# Patient Record
Sex: Female | Born: 1940
Health system: Southern US, Community
[De-identification: ages and names within clinical notes are randomized; demographics above are authoritative.]

## PROBLEM LIST (undated history)

## (undated) DIAGNOSIS — K219 Gastro-esophageal reflux disease without esophagitis: Secondary | ICD-10-CM

## (undated) DIAGNOSIS — I10 Essential (primary) hypertension: Secondary | ICD-10-CM

## (undated) DIAGNOSIS — Z923 Personal history of irradiation: Secondary | ICD-10-CM

## (undated) DIAGNOSIS — I517 Cardiomegaly: Secondary | ICD-10-CM

## (undated) DIAGNOSIS — E785 Hyperlipidemia, unspecified: Secondary | ICD-10-CM

## (undated) DIAGNOSIS — Z9889 Other specified postprocedural states: Secondary | ICD-10-CM

## (undated) DIAGNOSIS — K8020 Calculus of gallbladder without cholecystitis without obstruction: Secondary | ICD-10-CM

## (undated) DIAGNOSIS — R0600 Dyspnea, unspecified: Secondary | ICD-10-CM

## (undated) DIAGNOSIS — R112 Nausea with vomiting, unspecified: Secondary | ICD-10-CM

## (undated) DIAGNOSIS — R609 Edema, unspecified: Secondary | ICD-10-CM

## (undated) DIAGNOSIS — Z7189 Other specified counseling: Secondary | ICD-10-CM

## (undated) DIAGNOSIS — E119 Type 2 diabetes mellitus without complications: Secondary | ICD-10-CM

## (undated) DIAGNOSIS — I639 Cerebral infarction, unspecified: Secondary | ICD-10-CM

## (undated) DIAGNOSIS — J449 Chronic obstructive pulmonary disease, unspecified: Secondary | ICD-10-CM

## (undated) HISTORY — DX: Calculus of gallbladder without cholecystitis without obstruction: K80.20

## (undated) HISTORY — DX: Cardiomegaly: I51.7

## (undated) HISTORY — DX: Hyperlipidemia, unspecified: E78.5

## (undated) HISTORY — PX: CHOLECYSTECTOMY: SHX55

## (undated) HISTORY — DX: Type 2 diabetes mellitus without complications: E11.9

## (undated) HISTORY — DX: Essential (primary) hypertension: I10

## (undated) HISTORY — DX: Gastro-esophageal reflux disease without esophagitis: K21.9

## (undated) HISTORY — DX: Chronic obstructive pulmonary disease, unspecified: J44.9

## (undated) HISTORY — DX: Personal history of irradiation: Z92.3

## (undated) HISTORY — PX: ABDOMINAL HYSTERECTOMY: SHX81

## (undated) HISTORY — DX: Edema, unspecified: R60.9

## (undated) HISTORY — DX: Cerebral infarction, unspecified: I63.9

## (undated) NOTE — *Deleted (*Deleted)
GI Location of Tumor / Histology: ***  Tonya [name] presented *** months ago with symptoms of: ***  Biopsies of *** (if applicable) revealed: {:18581}  Past/Anticipated interventions by surgeon, if any:   Past/Anticipated interventions by medical oncology, if any: FOLFOX  Weight changes, if any: {:18581}  Bowel/Bladder complaints, if any: {:18581}  Nausea / Vomiting, if any: {:18581}  Pain issues, if any:  {:18581}  Any blood per rectum:   {:18581}  SAFETY ISSUES:  Prior radiation? {:18581}  Pacemaker/ICD? {:18581}  Possible current pregnancy? {:18581}  Is the patient on methotrexate? {:18581}  Current Complaints/Details:Stage IV adenocarcinoma of the ascending colon -- NRAS+, BRAF-, HER2-, MSI/MMR -,  Iron deficiency anemia DVT in LEFT leg-- Peroneal and popliteal vein

---
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
## 1999-04-25 DIAGNOSIS — I639 Cerebral infarction, unspecified: Secondary | ICD-10-CM

## 1999-04-25 HISTORY — DX: Cerebral infarction, unspecified: I63.9

## 2000-02-13 ENCOUNTER — Inpatient Hospital Stay (HOSPITAL_COMMUNITY): Admission: EM | Admit: 2000-02-13 | Discharge: 2000-02-16 | Payer: Self-pay | Admitting: Emergency Medicine

## 2000-02-14 ENCOUNTER — Encounter: Payer: Self-pay | Admitting: Neurology

## 2000-02-16 ENCOUNTER — Inpatient Hospital Stay (HOSPITAL_COMMUNITY)
Admission: RE | Admit: 2000-02-16 | Discharge: 2000-02-28 | Payer: Self-pay | Admitting: Physical Medicine & Rehabilitation

## 2000-07-30 ENCOUNTER — Encounter (HOSPITAL_COMMUNITY): Admission: RE | Admit: 2000-07-30 | Discharge: 2000-08-29 | Payer: Self-pay | Admitting: *Deleted

## 2000-08-31 ENCOUNTER — Encounter (HOSPITAL_COMMUNITY): Admission: RE | Admit: 2000-08-31 | Discharge: 2000-09-30 | Payer: Self-pay | Admitting: *Deleted

## 2000-09-20 ENCOUNTER — Other Ambulatory Visit: Admission: RE | Admit: 2000-09-20 | Discharge: 2000-09-20 | Payer: Self-pay | Admitting: Internal Medicine

## 2000-10-03 ENCOUNTER — Encounter (HOSPITAL_COMMUNITY)
Admission: RE | Admit: 2000-10-03 | Discharge: 2000-11-02 | Payer: Self-pay | Admitting: Physical Medicine & Rehabilitation

## 2001-08-28 ENCOUNTER — Encounter: Payer: Self-pay | Admitting: Internal Medicine

## 2001-08-28 ENCOUNTER — Ambulatory Visit (HOSPITAL_COMMUNITY): Admission: RE | Admit: 2001-08-28 | Discharge: 2001-08-28 | Payer: Self-pay | Admitting: Internal Medicine

## 2002-11-24 ENCOUNTER — Ambulatory Visit (HOSPITAL_COMMUNITY): Admission: RE | Admit: 2002-11-24 | Discharge: 2002-11-24 | Payer: Self-pay | Admitting: General Surgery

## 2002-11-24 ENCOUNTER — Encounter: Payer: Self-pay | Admitting: General Surgery

## 2003-12-31 ENCOUNTER — Ambulatory Visit (HOSPITAL_COMMUNITY): Admission: RE | Admit: 2003-12-31 | Discharge: 2003-12-31 | Payer: Self-pay | Admitting: Family Medicine

## 2004-07-20 ENCOUNTER — Ambulatory Visit (HOSPITAL_COMMUNITY): Admission: RE | Admit: 2004-07-20 | Discharge: 2004-07-20 | Payer: Self-pay | Admitting: Family Medicine

## 2004-10-20 ENCOUNTER — Ambulatory Visit: Payer: Self-pay | Admitting: Cardiovascular Disease

## 2004-10-27 ENCOUNTER — Ambulatory Visit: Payer: Self-pay

## 2005-02-20 ENCOUNTER — Ambulatory Visit (HOSPITAL_COMMUNITY): Admission: RE | Admit: 2005-02-20 | Discharge: 2005-02-20 | Payer: Self-pay | Admitting: Family Medicine

## 2005-11-03 ENCOUNTER — Ambulatory Visit: Payer: Self-pay

## 2006-01-16 ENCOUNTER — Ambulatory Visit: Payer: Self-pay | Admitting: Gastroenterology

## 2006-02-02 ENCOUNTER — Ambulatory Visit: Payer: Self-pay | Admitting: Gastroenterology

## 2006-02-22 ENCOUNTER — Ambulatory Visit (HOSPITAL_COMMUNITY): Admission: RE | Admit: 2006-02-22 | Discharge: 2006-02-22 | Payer: Self-pay | Admitting: Family Medicine

## 2006-03-06 ENCOUNTER — Ambulatory Visit: Payer: Self-pay | Admitting: Gastroenterology

## 2006-11-14 ENCOUNTER — Ambulatory Visit: Payer: Self-pay | Admitting: Gastroenterology

## 2006-11-15 ENCOUNTER — Ambulatory Visit: Payer: Self-pay | Admitting: Cardiology

## 2006-11-15 LAB — CONVERTED CEMR LAB
ALT: 10 units/L (ref 0–35)
AST: 20 units/L (ref 0–37)
Basophils Relative: 0 % (ref 0.0–1.0)
CO2: 28 meq/L (ref 19–32)
Creatinine, Ser: 0.7 mg/dL (ref 0.4–1.2)
Eosinophils Absolute: 0.1 10*3/uL (ref 0.0–0.6)
Eosinophils Relative: 1.3 % (ref 0.0–5.0)
GFR calc Af Amer: 107 mL/min
GFR calc non Af Amer: 88 mL/min
HCT: 37.7 % (ref 36.0–46.0)
Lymphocytes Relative: 37.9 % (ref 12.0–46.0)
MCHC: 33.5 g/dL (ref 30.0–36.0)
Neutro Abs: 2.4 10*3/uL (ref 1.4–7.7)
Neutrophils Relative %: 55.5 % (ref 43.0–77.0)
Potassium: 4.2 meq/L (ref 3.5–5.1)
Sodium: 140 meq/L (ref 135–145)
TSH: 1.25 microintl units/mL (ref 0.35–5.50)
WBC: 4.4 10*3/uL — ABNORMAL LOW (ref 4.5–10.5)

## 2006-11-27 ENCOUNTER — Encounter: Payer: Self-pay | Admitting: Gastroenterology

## 2006-11-27 ENCOUNTER — Ambulatory Visit (HOSPITAL_COMMUNITY): Admission: RE | Admit: 2006-11-27 | Discharge: 2006-11-27 | Payer: Self-pay | Admitting: Gastroenterology

## 2006-12-03 ENCOUNTER — Ambulatory Visit: Payer: Self-pay | Admitting: Gastroenterology

## 2006-12-18 ENCOUNTER — Ambulatory Visit: Payer: Self-pay | Admitting: Gastroenterology

## 2006-12-21 ENCOUNTER — Ambulatory Visit (HOSPITAL_COMMUNITY): Admission: RE | Admit: 2006-12-21 | Discharge: 2006-12-21 | Payer: Self-pay | Admitting: Gastroenterology

## 2007-02-27 ENCOUNTER — Ambulatory Visit (HOSPITAL_COMMUNITY): Admission: RE | Admit: 2007-02-27 | Discharge: 2007-02-27 | Payer: Self-pay | Admitting: Family Medicine

## 2007-07-26 DIAGNOSIS — J4489 Other specified chronic obstructive pulmonary disease: Secondary | ICD-10-CM | POA: Insufficient documentation

## 2007-07-26 DIAGNOSIS — I69359 Hemiplegia and hemiparesis following cerebral infarction affecting unspecified side: Secondary | ICD-10-CM | POA: Insufficient documentation

## 2007-07-26 DIAGNOSIS — J449 Chronic obstructive pulmonary disease, unspecified: Secondary | ICD-10-CM | POA: Insufficient documentation

## 2007-07-26 DIAGNOSIS — I10 Essential (primary) hypertension: Secondary | ICD-10-CM | POA: Insufficient documentation

## 2008-03-03 ENCOUNTER — Ambulatory Visit (HOSPITAL_COMMUNITY): Admission: RE | Admit: 2008-03-03 | Discharge: 2008-03-03 | Payer: Self-pay | Admitting: Family Medicine

## 2008-04-07 ENCOUNTER — Encounter: Admission: RE | Admit: 2008-04-07 | Discharge: 2008-04-07 | Payer: Self-pay | Admitting: Family Medicine

## 2008-04-15 ENCOUNTER — Telehealth: Payer: Self-pay | Admitting: Gastroenterology

## 2008-04-30 ENCOUNTER — Ambulatory Visit: Payer: Self-pay | Admitting: Nurse Practitioner

## 2008-05-08 ENCOUNTER — Ambulatory Visit: Payer: Self-pay | Admitting: Internal Medicine

## 2008-05-08 ENCOUNTER — Telehealth: Payer: Self-pay | Admitting: Internal Medicine

## 2008-05-11 ENCOUNTER — Ambulatory Visit (HOSPITAL_COMMUNITY): Admission: RE | Admit: 2008-05-11 | Discharge: 2008-05-11 | Payer: Self-pay | Admitting: Neurology

## 2008-05-15 ENCOUNTER — Telehealth: Payer: Self-pay | Admitting: Internal Medicine

## 2008-05-18 ENCOUNTER — Ambulatory Visit: Payer: Self-pay | Admitting: Internal Medicine

## 2008-05-18 DIAGNOSIS — M5416 Radiculopathy, lumbar region: Secondary | ICD-10-CM | POA: Insufficient documentation

## 2008-05-19 ENCOUNTER — Telehealth: Payer: Self-pay | Admitting: Internal Medicine

## 2008-06-02 ENCOUNTER — Telehealth: Payer: Self-pay | Admitting: Internal Medicine

## 2008-06-09 ENCOUNTER — Encounter
Admission: RE | Admit: 2008-06-09 | Discharge: 2008-09-07 | Payer: Self-pay | Admitting: Physical Medicine & Rehabilitation

## 2008-06-09 ENCOUNTER — Encounter: Admission: RE | Admit: 2008-06-09 | Discharge: 2008-07-08 | Payer: Self-pay | Admitting: Internal Medicine

## 2008-06-11 ENCOUNTER — Ambulatory Visit: Payer: Self-pay | Admitting: Physical Medicine & Rehabilitation

## 2008-06-18 ENCOUNTER — Ambulatory Visit: Payer: Self-pay | Admitting: Physical Medicine & Rehabilitation

## 2008-07-20 ENCOUNTER — Telehealth: Payer: Self-pay | Admitting: Internal Medicine

## 2008-07-23 ENCOUNTER — Ambulatory Visit: Payer: Self-pay | Admitting: Physical Medicine & Rehabilitation

## 2008-07-30 ENCOUNTER — Ambulatory Visit: Payer: Self-pay | Admitting: Internal Medicine

## 2008-08-04 ENCOUNTER — Ambulatory Visit: Payer: Self-pay | Admitting: Physical Medicine & Rehabilitation

## 2008-08-06 ENCOUNTER — Encounter: Payer: Self-pay | Admitting: Internal Medicine

## 2008-08-06 ENCOUNTER — Encounter: Admission: RE | Admit: 2008-08-06 | Discharge: 2008-08-06 | Payer: Self-pay | Admitting: Internal Medicine

## 2008-08-12 ENCOUNTER — Encounter (INDEPENDENT_AMBULATORY_CARE_PROVIDER_SITE_OTHER): Payer: Self-pay | Admitting: *Deleted

## 2008-09-08 ENCOUNTER — Telehealth: Payer: Self-pay | Admitting: Internal Medicine

## 2008-09-08 ENCOUNTER — Ambulatory Visit: Payer: Self-pay | Admitting: Gastroenterology

## 2008-09-10 ENCOUNTER — Telehealth (INDEPENDENT_AMBULATORY_CARE_PROVIDER_SITE_OTHER): Payer: Self-pay | Admitting: *Deleted

## 2008-09-10 LAB — CONVERTED CEMR LAB
ALT: 16 units/L (ref 0–35)
Albumin: 4.2 g/dL (ref 3.5–5.2)
BUN: 7 mg/dL (ref 6–23)
Basophils Absolute: 0 10*3/uL (ref 0.0–0.1)
CO2: 32 meq/L (ref 19–32)
Calcium: 9.6 mg/dL (ref 8.4–10.5)
Eosinophils Absolute: 0.1 10*3/uL (ref 0.0–0.7)
Eosinophils Relative: 1.4 % (ref 0.0–5.0)
GFR calc non Af Amer: 106.29 mL/min (ref >60)
HCT: 37.4 % (ref 36.0–46.0)
Hemoglobin: 12.9 g/dL (ref 12.0–15.0)
Lymphocytes Relative: 49.6 % — ABNORMAL HIGH (ref 12.0–46.0)
Lymphs Abs: 2.1 10*3/uL (ref 0.7–4.0)
MCV: 85.6 fL (ref 78.0–100.0)
Neutro Abs: 1.8 10*3/uL (ref 1.4–7.7)
Platelets: 191 10*3/uL (ref 150.0–400.0)
Potassium: 4.1 meq/L (ref 3.5–5.1)
Sodium: 144 meq/L (ref 135–145)
Total Protein: 7.1 g/dL (ref 6.0–8.3)
WBC: 4.3 10*3/uL — ABNORMAL LOW (ref 4.5–10.5)

## 2008-09-16 ENCOUNTER — Telehealth: Payer: Self-pay | Admitting: Gastroenterology

## 2008-09-28 ENCOUNTER — Ambulatory Visit: Payer: Self-pay | Admitting: Internal Medicine

## 2008-09-28 ENCOUNTER — Encounter: Payer: Self-pay | Admitting: Internal Medicine

## 2008-09-28 DIAGNOSIS — R011 Cardiac murmur, unspecified: Secondary | ICD-10-CM | POA: Insufficient documentation

## 2008-09-28 DIAGNOSIS — R609 Edema, unspecified: Secondary | ICD-10-CM | POA: Insufficient documentation

## 2008-09-30 LAB — CONVERTED CEMR LAB
Cholesterol: 184 mg/dL (ref 0–200)
LDL Cholesterol: 112 mg/dL — ABNORMAL HIGH (ref 0–99)
Triglycerides: 92 mg/dL (ref 0.0–149.0)

## 2008-10-12 ENCOUNTER — Encounter
Admission: RE | Admit: 2008-10-12 | Discharge: 2009-01-10 | Payer: Self-pay | Admitting: Physical Medicine & Rehabilitation

## 2008-10-12 ENCOUNTER — Ambulatory Visit: Payer: Self-pay | Admitting: Gastroenterology

## 2008-10-13 ENCOUNTER — Ambulatory Visit: Payer: Self-pay

## 2008-10-13 ENCOUNTER — Encounter: Payer: Self-pay | Admitting: Internal Medicine

## 2008-10-15 ENCOUNTER — Ambulatory Visit: Payer: Self-pay | Admitting: Physical Medicine & Rehabilitation

## 2008-10-21 ENCOUNTER — Telehealth (INDEPENDENT_AMBULATORY_CARE_PROVIDER_SITE_OTHER): Payer: Self-pay | Admitting: *Deleted

## 2008-11-03 ENCOUNTER — Ambulatory Visit: Payer: Self-pay | Admitting: Internal Medicine

## 2008-11-03 DIAGNOSIS — K219 Gastro-esophageal reflux disease without esophagitis: Secondary | ICD-10-CM | POA: Insufficient documentation

## 2008-11-06 ENCOUNTER — Encounter: Payer: Self-pay | Admitting: Gastroenterology

## 2008-11-17 ENCOUNTER — Telehealth: Payer: Self-pay | Admitting: Internal Medicine

## 2008-11-17 ENCOUNTER — Encounter: Payer: Self-pay | Admitting: Internal Medicine

## 2008-11-18 ENCOUNTER — Telehealth: Payer: Self-pay | Admitting: Internal Medicine

## 2008-11-19 ENCOUNTER — Ambulatory Visit: Payer: Self-pay | Admitting: Internal Medicine

## 2008-12-14 ENCOUNTER — Ambulatory Visit: Payer: Self-pay | Admitting: Internal Medicine

## 2008-12-14 ENCOUNTER — Ambulatory Visit: Payer: Self-pay | Admitting: Physical Medicine & Rehabilitation

## 2008-12-14 DIAGNOSIS — E785 Hyperlipidemia, unspecified: Secondary | ICD-10-CM | POA: Insufficient documentation

## 2008-12-23 ENCOUNTER — Telehealth: Payer: Self-pay | Admitting: Internal Medicine

## 2008-12-30 ENCOUNTER — Encounter (INDEPENDENT_AMBULATORY_CARE_PROVIDER_SITE_OTHER): Payer: Self-pay | Admitting: General Surgery

## 2008-12-30 ENCOUNTER — Ambulatory Visit (HOSPITAL_COMMUNITY): Admission: RE | Admit: 2008-12-30 | Discharge: 2008-12-31 | Payer: Self-pay | Admitting: General Surgery

## 2009-01-07 ENCOUNTER — Encounter
Admission: RE | Admit: 2009-01-07 | Discharge: 2009-04-07 | Payer: Self-pay | Admitting: Physical Medicine & Rehabilitation

## 2009-01-11 ENCOUNTER — Ambulatory Visit: Payer: Self-pay | Admitting: Physical Medicine & Rehabilitation

## 2009-01-12 ENCOUNTER — Encounter: Payer: Self-pay | Admitting: Gastroenterology

## 2009-01-22 ENCOUNTER — Telehealth: Payer: Self-pay | Admitting: Internal Medicine

## 2009-02-11 ENCOUNTER — Ambulatory Visit: Payer: Self-pay | Admitting: Internal Medicine

## 2009-02-11 LAB — CONVERTED CEMR LAB
Cholesterol, target level: 200 mg/dL
HDL goal, serum: 40 mg/dL

## 2009-03-11 ENCOUNTER — Ambulatory Visit: Payer: Self-pay | Admitting: Physical Medicine & Rehabilitation

## 2009-03-16 ENCOUNTER — Ambulatory Visit (HOSPITAL_COMMUNITY): Admission: RE | Admit: 2009-03-16 | Discharge: 2009-03-16 | Payer: Self-pay | Admitting: Internal Medicine

## 2009-03-22 ENCOUNTER — Telehealth: Payer: Self-pay | Admitting: Internal Medicine

## 2009-03-23 ENCOUNTER — Telehealth: Payer: Self-pay | Admitting: Internal Medicine

## 2009-03-24 ENCOUNTER — Encounter: Payer: Self-pay | Admitting: Internal Medicine

## 2009-03-25 ENCOUNTER — Ambulatory Visit (HOSPITAL_COMMUNITY): Admission: RE | Admit: 2009-03-25 | Discharge: 2009-03-25 | Payer: Self-pay | Admitting: Internal Medicine

## 2009-04-26 ENCOUNTER — Telehealth: Payer: Self-pay | Admitting: Internal Medicine

## 2009-05-11 ENCOUNTER — Telehealth: Payer: Self-pay | Admitting: Internal Medicine

## 2009-05-19 ENCOUNTER — Encounter
Admission: RE | Admit: 2009-05-19 | Discharge: 2009-05-21 | Payer: Self-pay | Admitting: Physical Medicine & Rehabilitation

## 2009-05-21 ENCOUNTER — Ambulatory Visit: Payer: Self-pay | Admitting: Physical Medicine & Rehabilitation

## 2009-05-25 ENCOUNTER — Telehealth: Payer: Self-pay | Admitting: Internal Medicine

## 2009-06-08 ENCOUNTER — Ambulatory Visit: Payer: Self-pay | Admitting: Internal Medicine

## 2009-06-08 LAB — CONVERTED CEMR LAB
Albumin: 4 g/dL (ref 3.5–5.2)
Alkaline Phosphatase: 58 units/L (ref 39–117)
BUN: 14 mg/dL (ref 6–23)
Bilirubin, Direct: 0.1 mg/dL (ref 0.0–0.3)
CO2: 33 meq/L — ABNORMAL HIGH (ref 19–32)
Chloride: 105 meq/L (ref 96–112)
Creatinine, Ser: 0.7 mg/dL (ref 0.4–1.2)
Glucose, Bld: 113 mg/dL — ABNORMAL HIGH (ref 70–99)
Potassium: 4.2 meq/L (ref 3.5–5.1)
Total Bilirubin: 0.8 mg/dL (ref 0.3–1.2)

## 2009-07-26 ENCOUNTER — Telehealth: Payer: Self-pay | Admitting: Internal Medicine

## 2009-08-30 ENCOUNTER — Encounter
Admission: RE | Admit: 2009-08-30 | Discharge: 2009-10-04 | Payer: Self-pay | Admitting: Physical Medicine & Rehabilitation

## 2009-09-02 ENCOUNTER — Ambulatory Visit: Payer: Self-pay | Admitting: Physical Medicine & Rehabilitation

## 2009-09-08 ENCOUNTER — Telehealth: Payer: Self-pay | Admitting: Internal Medicine

## 2009-09-14 ENCOUNTER — Ambulatory Visit: Payer: Self-pay | Admitting: Internal Medicine

## 2009-09-27 ENCOUNTER — Telehealth: Payer: Self-pay | Admitting: Internal Medicine

## 2009-09-27 ENCOUNTER — Ambulatory Visit: Payer: Self-pay | Admitting: Internal Medicine

## 2009-10-04 ENCOUNTER — Ambulatory Visit: Payer: Self-pay | Admitting: Physical Medicine & Rehabilitation

## 2009-12-20 ENCOUNTER — Ambulatory Visit: Payer: Self-pay | Admitting: Internal Medicine

## 2009-12-20 DIAGNOSIS — N318 Other neuromuscular dysfunction of bladder: Secondary | ICD-10-CM | POA: Insufficient documentation

## 2009-12-29 ENCOUNTER — Telehealth: Payer: Self-pay | Admitting: Internal Medicine

## 2010-01-20 ENCOUNTER — Telehealth: Payer: Self-pay | Admitting: Internal Medicine

## 2010-03-23 ENCOUNTER — Encounter
Admission: RE | Admit: 2010-03-23 | Discharge: 2010-05-24 | Payer: Self-pay | Source: Home / Self Care | Attending: Physical Medicine & Rehabilitation | Admitting: Physical Medicine & Rehabilitation

## 2010-03-25 ENCOUNTER — Ambulatory Visit (HOSPITAL_COMMUNITY)
Admission: RE | Admit: 2010-03-25 | Discharge: 2010-03-25 | Payer: Self-pay | Source: Home / Self Care | Admitting: Internal Medicine

## 2010-03-31 ENCOUNTER — Ambulatory Visit: Payer: Self-pay | Admitting: Physical Medicine & Rehabilitation

## 2010-04-26 ENCOUNTER — Ambulatory Visit
Admission: RE | Admit: 2010-04-26 | Discharge: 2010-04-26 | Payer: Self-pay | Source: Home / Self Care | Attending: Internal Medicine | Admitting: Internal Medicine

## 2010-04-26 LAB — CONVERTED CEMR LAB
ALT: 16 units/L (ref 0–35)
Albumin: 3.9 g/dL (ref 3.5–5.2)
Basophils Relative: 0.6 % (ref 0.0–3.0)
CO2: 31 meq/L (ref 19–32)
Calcium: 9.6 mg/dL (ref 8.4–10.5)
Cholesterol: 158 mg/dL (ref 0–200)
Creatinine, Ser: 0.5 mg/dL (ref 0.4–1.2)
Eosinophils Absolute: 0.1 10*3/uL (ref 0.0–0.7)
GFR calc non Af Amer: 145.85 mL/min (ref >60.00)
HCT: 39.2 % (ref 36.0–46.0)
HDL: 48.5 mg/dL (ref >39.00)
LDL Cholesterol: 96 mg/dL (ref 0–99)
Lymphocytes Relative: 50.2 % — ABNORMAL HIGH (ref 12.0–46.0)
Lymphs Abs: 2.2 10*3/uL (ref 0.7–4.0)
MCHC: 33.2 g/dL (ref 30.0–36.0)
MCV: 87.5 fL (ref 78.0–100.0)
Monocytes Relative: 7.1 % (ref 3.0–12.0)
Neutro Abs: 1.8 10*3/uL (ref 1.4–7.7)
Neutrophils Relative %: 39.8 % — ABNORMAL LOW (ref 43.0–77.0)
Total Bilirubin: 0.7 mg/dL (ref 0.3–1.2)
Total CHOL/HDL Ratio: 3
Total Protein: 6.8 g/dL (ref 6.0–8.3)
VLDL: 13.2 mg/dL (ref 0.0–40.0)

## 2010-04-27 ENCOUNTER — Encounter: Payer: Self-pay | Admitting: Internal Medicine

## 2010-04-29 ENCOUNTER — Telehealth: Payer: Self-pay | Admitting: Internal Medicine

## 2010-05-15 ENCOUNTER — Encounter: Payer: Self-pay | Admitting: Neurology

## 2010-05-24 NOTE — Progress Notes (Signed)
  Phone Note Refill Request   Refills Requested: Medication #1:  VALTURNA 150-160 MG TABS once daily for high blood pressure Initial call taken by: Ami Bullins CMA,  May 11, 2009 3:30 PM    Prescriptions: VALTURNA 150-160 MG TABS (ALISKIREN-VALSARTAN) once daily for high blood pressure  #56 x 2   Entered by:   Ami Bullins CMA   Authorized by:   Etta Grandchild MD   Signed by:   Bill Salinas CMA on 05/11/2009   Method used:   Electronically to        [facility] (retail)       [address]       [place], [place]  [address]       Ph: [phone]       Fax: [phone]   RxID:   [id number]

## 2010-05-24 NOTE — Progress Notes (Signed)
Summary: Samples  Phone Note Call from Patient   Caller: Daughter - Eunice Blase [phone] Summary of Call: Patient is requesting samples of valturna.  Initial call taken by: Lamar Sprinkles, CMA,  July 26, 2009 3:05 PM  Follow-up for Phone Call        Samples given to daughter Follow-up by: Lamar Sprinkles, CMA,  July 26, 2009 5:00 PM

## 2010-05-24 NOTE — Progress Notes (Signed)
Summary: ELEVATED BP  Phone Note Call from Patient   Summary of Call: Pt concerned about elevated bp. She checks bp three times a day. Bp has been running upper 140's over 80's.  Initial call taken by: Lamar Sprinkles, CMA,  Sep 08, 2009 11:03 AM  Follow-up for Phone Call        she should schedule a f/up soon Follow-up by: Etta Grandchild MD,  Sep 08, 2009 2:20 PM  Additional Follow-up for Phone Call Additional follow up Details #1::        Pt informed, transferred to scheduler Additional Follow-up by: Lamar Sprinkles, CMA,  Sep 08, 2009 2:54 PM

## 2010-05-24 NOTE — Progress Notes (Signed)
Summary: Samples  Phone Note Call from Patient   Summary of Call: Samples given to patient Initial call taken by: Lamar Sprinkles, CMA,  January 20, 2010 11:43 AM    Prescriptions: TOVIAZ 4 MG XR24H-TAB (FESOTERODINE FUMARATE) One by mouth once daily for overactive bladder  #7 x 0   Entered by:   Lamar Sprinkles, CMA   Authorized by:   Tresa Garter MD   Signed by:   Lamar Sprinkles, CMA on 01/20/2010   Method used:   Samples Given   RxID:   [id number] VALTURNA 300-320 MG TABS (ALISKIREN-VALSARTAN) One by mouth once daily for high blood pressure  #30 x 0   Entered by:   Lamar Sprinkles, CMA   Authorized by:   Tresa Garter MD   Signed by:   Lamar Sprinkles, CMA on 01/20/2010   Method used:   Samples Given   RxID:   [id number] BYSTOLIC 10 MG TABS (NEBIVOLOL HCL) once daily  #30 x 0   Entered by:   Lamar Sprinkles, CMA   Authorized by:   Tresa Garter MD   Signed by:   Lamar Sprinkles, CMA on 01/20/2010   Method used:   Samples Given   RxID:   [id number] SIMCOR 500-20 MG XR24H-TAB (NIACIN-SIMVASTATIN) One by mouth once daily  #14 x 0   Entered by:   Lamar Sprinkles, CMA   Authorized by:   Tresa Garter MD   Signed by:   Lamar Sprinkles, CMA on 01/20/2010   Method used:   Samples Given   RxID:   [id number] ASPIRIN 81 MG  TABS (ASPIRIN) 1 by mouth once daily  #30 x 0   Entered by:   Lamar Sprinkles, CMA   Authorized by:   Tresa Garter MD   Signed by:   Lamar Sprinkles, CMA on 01/20/2010   Method used:   Samples Given   RxID:   [id number]

## 2010-05-24 NOTE — Progress Notes (Signed)
Summary: refill--hctz  Phone Note Refill Request Message from:  Fax from [facility] on December 29, 2009 4:26 PM  Refills Requested: Medication #1:  HYDROCHLOROTHIAZIDE 50 MG TABS once daily for high blood pressure   Dosage confirmed as above?Dosage Confirmed   Supply Requested: 1 month   Last Refilled: 11/26/2009 Initial call taken by: Mervin Kung CMA (AAMA),  December 29, 2009 4:26 PM    Prescriptions: HYDROCHLOROTHIAZIDE 50 MG TABS (HYDROCHLOROTHIAZIDE) once daily for high blood pressure  #30 x 3   Entered by:   Mervin Kung CMA (AAMA)   Authorized by:   Etta Grandchild MD   Signed by:   Mervin Kung CMA (AAMA) on 12/29/2009   Method used:   Electronically to        [facility] (retail)       [address]       [place], [place]  [address]       Ph: [phone]       Fax: [phone]   RxID:   [id number]

## 2010-05-24 NOTE — Progress Notes (Signed)
Summary: Samples  Phone Note Call from Patient Call back at Home Phone [phone]   Caller: Daughter Call For: Etta Grandchild MD Summary of Call: Patient's daughter came into the office requesting samples of Valturna 150 mg for her mother. Initial call taken by: Irma Newness,  April 26, 2009 12:49 PM  Follow-up for Phone Call        samples given to daughter Follow-up by: Rock Nephew CMA,  April 26, 2009 1:10 PM

## 2010-05-24 NOTE — Assessment & Plan Note (Signed)
Summary: follow up-lb   Vital Signs:  Patient profile:   70 year old female Height:      71.5 inches Weight:      183.75 pounds BMI:     25.36 O2 Sat:      98 % on Room air Temp:     98.2 degrees F oral Pulse rate:   53 / minute Pulse rhythm:   regular Resp:     16 per minute BP sitting:   128 / 84  (right arm) Cuff size:   large  Vitals Entered By: Rock Nephew CMA (Sep 14, 2009 11:19 AM)  Nutrition Counseling: Patient's BMI is greater than 25 and therefore counseled on weight management options.  O2 Flow:  Room air  Primary Care Provider:  Etta Grandchild MD   History of Present Illness:  Hypertension Follow-Up      This is a 70 year old woman who presents for Hypertension follow-up.  The patient denies lightheadedness, urinary frequency, headaches, edema, impotence, rash, and fatigue.  The patient denies the following associated symptoms: chest pain, chest pressure, exercise intolerance, dyspnea, palpitations, syncope, leg edema, and pedal edema.  Compliance with medications (by patient report) has been near 100%.  The patient reports that dietary compliance has been poor.  The patient reports exercising occasionally.    Lipid Management History:      Positive NCEP/ATP III risk factors include female age 83 years old or older, hypertension, and prior stroke (or TIA).  Negative NCEP/ATP III risk factors include non-diabetic, no family history for ischemic heart disease, non-tobacco-user status, no ASHD (atherosclerotic heart disease), no peripheral vascular disease, and no history of aortic aneurysm.        The patient states that she knows about the "Therapeutic Lifestyle Change" diet.  Her compliance with the TLC diet is fair.  The patient expresses understanding of adjunctive measures for cholesterol lowering.  Adjunctive measures started by the patient include fiber, ASA, limit alcohol consumpton, and weight reduction.  She expresses no side effects from her lipid-lowering  medication.  The patient denies any symptoms to suggest myopathy or liver disease.     Preventive Screening-Counseling & Management  Alcohol-Tobacco     Alcohol drinks/day: 0     Smoking Status: never  Hep-HIV-STD-Contraception     Hepatitis Risk: no risk noted     HIV Risk: no risk noted     STD Risk: no risk noted      Sexual History:  not active.        Drug Use:  no.        Blood Transfusions:  no.    Medications Prior to Update: 1)  Aspirin 81 Mg  Tabs (Aspirin) .Marland Kitchen.. 1 By Mouth Once Daily 2)  Hydrochlorothiazide 50 Mg Tabs (Hydrochlorothiazide) .... Once Daily For High Blood Pressure 3)  Centrum Silver  Tabs (Multiple Vitamins-Minerals) .... Once Daily 4)  Calcium 600/vitamin D 600-400 Mg-Unit Tabs (Calcium Carbonate-Vitamin D) .... Once Daily 5)  Nexium 40 Mg Cpdr (Esomeprazole Magnesium) .Marland Kitchen.. 1po Once Daily 6)  Simcor 500-20 Mg Xr24h-Tab (Niacin-Simvastatin) .... One By Mouth Once Daily 7)  Bystolic 10 Mg Tabs (Nebivolol Hcl) .... Once Daily 8)  Klor-Con/ef 25 Meq Tbef (Potassium Bicarbonate) .... One By Mouth Once Daily 9)  Valturna 300-320 Mg Tabs (Aliskiren-Valsartan) .... One By Mouth Once Daily For High Blood Pressure  Current Medications (verified): 1)  Aspirin 81 Mg  Tabs (Aspirin) .Marland Kitchen.. 1 By Mouth Once Daily 2)  Hydrochlorothiazide 50 Mg  Tabs (Hydrochlorothiazide) .... Once Daily For High Blood Pressure 3)  Centrum Silver  Tabs (Multiple Vitamins-Minerals) .... Once Daily 4)  Calcium 600/vitamin D 600-400 Mg-Unit Tabs (Calcium Carbonate-Vitamin D) .... Once Daily 5)  Simcor 500-20 Mg Xr24h-Tab (Niacin-Simvastatin) .... One By Mouth Once Daily 6)  Bystolic 10 Mg Tabs (Nebivolol Hcl) .... Once Daily 7)  Valturna 300-320 Mg Tabs (Aliskiren-Valsartan) .... One By Mouth Once Daily For High Blood Pressure  Allergies (verified): 1)  ! * Naproxen 2)  ! Darvocet 3)  ! Hydrocodone 4)  ! * Nucynta  Past History:  Past Medical History: Reviewed history from  12/14/2008 and no changes required. Current Problems:  GALLSTONES (ICD-574.20) COPD (ICD-496) ADENOCARCINOMA, COLON, FAMILY HX (ICD-V16.0) CEREBROVASCULAR ACCIDENT, HX OF (ICD-V12.50) HYPERTENSION (ICD-401.9) "enlarged heart" per pt chronic edema- ? venous insufficiency GERD Hyperlipidemia  Past Surgical History: Reviewed history from 02/11/2009 and no changes required. hysterectomy Cholecystectomy  Family History: Reviewed history from 04/30/2008 and no changes required. No FH of Colon Cancer: Family History of Heart Disease: father Family History of Liver Disease/Cirrhosis:mother  Social History: Reviewed history from 04/30/2008 and no changes required. Patient has never smoked.  Alcohol Use - no Illicit Drug Use - no Patient does not get regular exercise.   Review of Systems  The patient denies anorexia, fever, weight loss, weight gain, chest pain, syncope, peripheral edema, prolonged cough, headaches, hemoptysis, abdominal pain, hematuria, and depression.   CV:  Denies chest pain or discomfort, fainting, fatigue, leg cramps with exertion, lightheadness, near fainting, palpitations, shortness of breath with exertion, swelling of feet, and weight gain.  Physical Exam  General:  alert, well-developed, well-nourished, and well-hydrated.   Eyes:  vision grossly intact and pupils equal.   Mouth:  Oral mucosa and oropharynx without lesions or exudates.  Teeth in good repair. Neck:  supple, full ROM, no masses, no carotid bruits, and no neck tenderness.   Lungs:  Normal respiratory effort, chest expands symmetrically. Lungs are clear to auscultation, no crackles or wheezes. Heart:  normal rate and regular rhythm.  with grade 1-2 systolic murmur heard best left upper sternal border, no radiation, no gallop or lift Abdomen:  Bowel sounds positive,abdomen soft and non-tender without masses, organomegaly or hernias noted. Msk:  No deformity or scoliosis noted of thoracic or lumbar  spine.   Pulses:  R and L carotid,radial,femoral,dorsalis pedis and posterior tibial pulses are full and equal bilaterally Extremities:  No clubbing, cyanosis, edema, or deformity noted with normal full range of motion of all joints.   Neurologic:  alert & oriented X3, cranial nerves II-XII intact, RUE hyporeflexia, RLE hyporeflexia, LUE hyperreflexia, LUE weakness, LLE hyperreflexia, and LLE weakness.   Skin:  turgor normal, color normal, no rashes, no suspicious lesions, no ecchymoses, and no petechiae.   Cervical Nodes:  No lymphadenopathy noted Psych:  Cognition and judgment appear intact. Alert and cooperative with normal attention span and concentration. No apparent delusions, illusions, hallucinations   Impression & Recommendations:  Problem # 1:  HYPERTENSION (ICD-401.9) Assessment Improved  Her updated medication list for this problem includes:    Hydrochlorothiazide 50 Mg Tabs (Hydrochlorothiazide) ..... Once daily for high blood pressure    Bystolic 10 Mg Tabs (Nebivolol hcl) ..... Once daily    Valturna 300-320 Mg Tabs (Aliskiren-valsartan) ..... One by mouth once daily for high blood pressure  BP today: 128/84 Prior BP: 140/82 (06/08/2009)  Prior 10 Yr Risk Heart Disease: 13 % (02/11/2009)  Labs Reviewed: K+: 4.2 (06/08/2009) Creat: : 0.7 (06/08/2009)  Chol: 184 (09/28/2008)   HDL: 54.00 (09/28/2008)   LDL: 112 (09/28/2008)   TG: 92.0 (09/28/2008)  Orders: Nutrition Referral (Nutrition)  Problem # 2:  PERIPHERAL EDEMA (ICD-782.3) Assessment: Improved  Her updated medication list for this problem includes:    Hydrochlorothiazide 50 Mg Tabs (Hydrochlorothiazide) ..... Once daily for high blood pressure  Complete Medication List: 1)  Aspirin 81 Mg Tabs (Aspirin) .Marland Kitchen.. 1 by mouth once daily 2)  Hydrochlorothiazide 50 Mg Tabs (Hydrochlorothiazide) .... Once daily for high blood pressure 3)  Centrum Silver Tabs (Multiple vitamins-minerals) .... Once daily 4)  Calcium  600/vitamin D 600-400 Mg-unit Tabs (Calcium carbonate-vitamin d) .... Once daily 5)  Simcor 500-20 Mg Xr24h-tab (Niacin-simvastatin) .... One by mouth once daily 6)  Bystolic 10 Mg Tabs (Nebivolol hcl) .... Once daily 7)  Valturna 300-320 Mg Tabs (Aliskiren-valsartan) .... One by mouth once daily for high blood pressure  Lipid Assessment/Plan:      Based on NCEP/ATP III, the patient's risk factor category is "history of coronary disease, peripheral vascular disease, cerebrovascular disease, or aortic aneurysm".  The patient's lipid goals are as follows: Total cholesterol goal is 200; LDL cholesterol goal is 100; HDL cholesterol goal is 40; Triglyceride goal is 150.    Patient Instructions: 1)  Please schedule a follow-up appointment in 3 months. 2)  Limit your Sodium (Salt) to less than 2 grams a day(slightly less than 1/2 a teaspoon) to prevent fluid retention, swelling, or worsening of symptoms. 3)  Check your Blood Pressure regularly. If it is above 130/80: you should make an appointment.

## 2010-05-24 NOTE — Assessment & Plan Note (Signed)
Summary: FU ON BP / NWS  #   Vital Signs:  Patient profile:   70 year old female Height:      71.5 inches (181.61 cm) Weight:      181 pounds (82.27 kg) BMI:     24.98 O2 Sat:      98 % on Room air Temp:     97.2 degrees F (36.22 degrees C) oral Pulse rate:   62 / minute Pulse rhythm:   regular Resp:     16 per minute BP sitting:   140 / 82  (right arm) Cuff size:   regular  Vitals Entered By: Rock Nephew CMA (June 08, 2009 11:20 AM)  O2 Flow:  Room air CC: Pt here to discuss meds/has been doubling up on BP meds/refill request/aj, Lipid Management   Primary Care Provider:  Etta Grandchild MD  CC:  Pt here to discuss meds/has been doubling up on BP meds/refill request/aj and Lipid Management.  History of Present Illness: She returns for f/up and says that BP has been high so she has doubled her dose of Valturna.      Hypertension Follow-Up      This is a 70 year old woman who presents for Hypertension follow-up.  The patient denies lightheadedness, urinary frequency, headaches, edema, and fatigue.  The patient denies the following associated symptoms: chest pain, chest pressure, exercise intolerance, dyspnea, palpitations, syncope, leg edema, and pedal edema.  Compliance with medications (by patient report) has been near 100%.  The patient reports that dietary compliance has been good.  The patient reports no exercise.  Adjunctive measures currently used by the patient include salt restriction.    Lipid Management History:      Positive NCEP/ATP III risk factors include female age 72 years old or older, hypertension, and prior stroke (or TIA).  Negative NCEP/ATP III risk factors include non-diabetic, no family history for ischemic heart disease, non-tobacco-user status, no ASHD (atherosclerotic heart disease), no peripheral vascular disease, and no history of aortic aneurysm.        The patient states that she knows about the "Therapeutic Lifestyle Change" diet.  Her  compliance with the TLC diet is fair.  The patient expresses understanding of adjunctive measures for cholesterol lowering.  Adjunctive measures started by the patient include fiber, ASA, limit alcohol consumpton, and weight reduction.  She expresses no side effects from her lipid-lowering medication.  The patient denies any symptoms to suggest myopathy or liver disease.     Preventive Screening-Counseling & Management  Alcohol-Tobacco     Alcohol drinks/day: 0     Smoking Status: never  Hep-HIV-STD-Contraception     Hepatitis Risk: no risk noted     HIV Risk: no risk noted     STD Risk: no risk noted      Sexual History:  not active.        Drug Use:  no.        Blood Transfusions:  no.    Clinical Review Panels:  Lipid Management   Cholesterol:  184 (09/28/2008)   LDL (bad choesterol):  112 (09/28/2008)   HDL (good cholesterol):  54.00 (09/28/2008)  Diabetes Management   Creatinine:  0.7 (09/08/2008)  CBC   WBC:  4.3 (09/08/2008)   RBC:  4.37 (09/08/2008)   Hgb:  12.9 (09/08/2008)   Hct:  37.4 (09/08/2008)   Platelets:  191.0 (09/08/2008)   MCV  85.6 (09/08/2008)   MCHC  34.5 (09/08/2008)   RDW  11.9 (  09/08/2008)   PMN:  41.9 (09/08/2008)   Lymphs:  49.6 (09/08/2008)   Monos:  6.9 (09/08/2008)   Eosinophils:  1.4 (09/08/2008)   Basophil:  0.2 (09/08/2008)  Complete Metabolic Panel   Glucose:  90 (09/08/2008)   Sodium:  144 (09/08/2008)   Potassium:  4.1 (09/08/2008)   Chloride:  109 (09/08/2008)   CO2:  32 (09/08/2008)   BUN:  7 (09/08/2008)   Creatinine:  0.7 (09/08/2008)   Albumin:  4.2 (09/08/2008)   Total Protein:  7.1 (09/08/2008)   Calcium:  9.6 (09/08/2008)   Total Bili:  0.6 (09/08/2008)   Alk Phos:  73 (09/08/2008)   SGPT (ALT):  16 (09/08/2008)   SGOT (AST):  20 (09/08/2008)   Medications Prior to Update: 1)  Aspirin 81 Mg  Tabs (Aspirin) .Marland Kitchen.. 1 By Mouth Once Daily 2)  Hydrochlorothiazide 50 Mg Tabs (Hydrochlorothiazide) .... Once Daily For  High Blood Pressure 3)  Centrum Silver  Tabs (Multiple Vitamins-Minerals) .... Once Daily 4)  Calcium 600/vitamin D 600-400 Mg-Unit Tabs (Calcium Carbonate-Vitamin D) .... Once Daily 5)  Nexium 40 Mg Cpdr (Esomeprazole Magnesium) .Marland Kitchen.. 1po Once Daily 6)  Simcor 500-20 Mg Xr24h-Tab (Niacin-Simvastatin) .... One By Mouth Once Daily 7)  Bystolic 10 Mg Tabs (Nebivolol Hcl) .... Once Daily 8)  Valturna 150-160 Mg Tabs (Aliskiren-Valsartan) .... Once Daily For High Blood Pressure 9)  Klor-Con/ef 25 Meq Tbef (Potassium Bicarbonate) .... One By Mouth Once Daily  Current Medications (verified): 1)  Aspirin 81 Mg  Tabs (Aspirin) .Marland Kitchen.. 1 By Mouth Once Daily 2)  Hydrochlorothiazide 50 Mg Tabs (Hydrochlorothiazide) .... Once Daily For High Blood Pressure 3)  Centrum Silver  Tabs (Multiple Vitamins-Minerals) .... Once Daily 4)  Calcium 600/vitamin D 600-400 Mg-Unit Tabs (Calcium Carbonate-Vitamin D) .... Once Daily 5)  Nexium 40 Mg Cpdr (Esomeprazole Magnesium) .Marland Kitchen.. 1po Once Daily 6)  Simcor 500-20 Mg Xr24h-Tab (Niacin-Simvastatin) .... One By Mouth Once Daily 7)  Bystolic 10 Mg Tabs (Nebivolol Hcl) .... Once Daily 8)  Klor-Con/ef 25 Meq Tbef (Potassium Bicarbonate) .... One By Mouth Once Daily 9)  Valturna 300-320 Mg Tabs (Aliskiren-Valsartan) .... One By Mouth Once Daily For High Blood Pressure  Allergies (verified): 1)  ! * Naproxen 2)  ! Darvocet 3)  ! Hydrocodone 4)  ! * Nucynta  Past History:  Past Medical History: Reviewed history from 12/14/2008 and no changes required. Current Problems:  GALLSTONES (ICD-574.20) COPD (ICD-496) ADENOCARCINOMA, COLON, FAMILY HX (ICD-V16.0) CEREBROVASCULAR ACCIDENT, HX OF (ICD-V12.50) HYPERTENSION (ICD-401.9) "enlarged heart" per pt chronic edema- ? venous insufficiency GERD Hyperlipidemia  Past Surgical History: Reviewed history from 02/11/2009 and no changes required. hysterectomy Cholecystectomy  Family History: Reviewed history from 04/30/2008  and no changes required. No FH of Colon Cancer: Family History of Heart Disease: father Family History of Liver Disease/Cirrhosis:mother  Social History: Reviewed history from 04/30/2008 and no changes required. Patient has never smoked.  Alcohol Use - no Illicit Drug Use - no Patient does not get regular exercise.  Hepatitis Risk:  no risk noted HIV Risk:  no risk noted STD Risk:  no risk noted Sexual History:  not active Blood Transfusions:  no  Review of Systems  The patient denies weight loss, weight gain, peripheral edema, prolonged cough, abdominal pain, hematuria, and depression.    Physical Exam  General:  alert, well-developed, well-nourished, and well-hydrated.   Head:  normocephalic and atraumatic.   Mouth:  Oral mucosa and oropharynx without lesions or exudates.  Teeth in good repair. Neck:  supple,  full ROM, no masses, no carotid bruits, and no neck tenderness.   Lungs:  Normal respiratory effort, chest expands symmetrically. Lungs are clear to auscultation, no crackles or wheezes. Heart:  normal rate and regular rhythm.  with grade 1-2 systolic murmur heard best left upper sternal border, no radiation, no gallop or lift Abdomen:  Bowel sounds positive,abdomen soft and non-tender without masses, organomegaly or hernias noted. Msk:  No deformity or scoliosis noted of thoracic or lumbar spine.   Pulses:  R and L carotid,radial,femoral,dorsalis pedis and posterior tibial pulses are full and equal bilaterally Extremities:  No clubbing, cyanosis, edema, or deformity noted with normal full range of motion of all joints.   Neurologic:  alert & oriented X3, cranial nerves II-XII intact, RUE hyporeflexia, RLE hyporeflexia, LUE hyperreflexia, LUE weakness, LLE hyperreflexia, and LLE weakness.   Skin:  turgor normal, color normal, no rashes, no suspicious lesions, no ecchymoses, and no petechiae.   Cervical Nodes:  No lymphadenopathy noted Psych:  Cognition and judgment appear  intact. Alert and cooperative with normal attention span and concentration. No apparent delusions, illusions, hallucinations   Impression & Recommendations:  Problem # 1:  HYPERLIPIDEMIA (ICD-272.4) Assessment Unchanged  Her updated medication list for this problem includes:    Simcor 500-20 Mg Xr24h-tab (Niacin-simvastatin) ..... One by mouth once daily  Orders: Venipuncture (16109) TLB-BMP (Basic Metabolic Panel-BMET) (80048-METABOL) TLB-Hepatic/Liver Function Pnl (80076-HEPATIC)  Labs Reviewed: SGOT: 20 (09/08/2008)   SGPT: 16 (09/08/2008)  Lipid Goals: Chol Goal: 200 (02/11/2009)   HDL Goal: 40 (02/11/2009)   LDL Goal: 100 (02/11/2009)   TG Goal: 150 (02/11/2009)  Prior 10 Yr Risk Heart Disease: 13 % (02/11/2009)   HDL:54.00 (09/28/2008)  LDL:112 (09/28/2008)  Chol:184 (09/28/2008)  Trig:92.0 (09/28/2008)  Problem # 2:  HYPERTENSION (ICD-401.9) Assessment: Improved  The following medications were removed from the medication list:    Valturna 150-160 Mg Tabs (Aliskiren-valsartan) ..... Once daily for high blood pressure Her updated medication list for this problem includes:    Hydrochlorothiazide 50 Mg Tabs (Hydrochlorothiazide) ..... Once daily for high blood pressure    Bystolic 10 Mg Tabs (Nebivolol hcl) ..... Once daily    Valturna 300-320 Mg Tabs (Aliskiren-valsartan) ..... One by mouth once daily for high blood pressure  Orders: Venipuncture (60454) TLB-BMP (Basic Metabolic Panel-BMET) (80048-METABOL) TLB-Hepatic/Liver Function Pnl (80076-HEPATIC)  BP today: 140/82 Prior BP: 158/84 (02/11/2009)  Prior 10 Yr Risk Heart Disease: 13 % (02/11/2009)  Labs Reviewed: K+: 4.1 (09/08/2008) Creat: : 0.7 (09/08/2008)   Chol: 184 (09/28/2008)   HDL: 54.00 (09/28/2008)   LDL: 112 (09/28/2008)   TG: 92.0 (09/28/2008)  Complete Medication List: 1)  Aspirin 81 Mg Tabs (Aspirin) .Marland Kitchen.. 1 by mouth once daily 2)  Hydrochlorothiazide 50 Mg Tabs (Hydrochlorothiazide) .... Once  daily for high blood pressure 3)  Centrum Silver Tabs (Multiple vitamins-minerals) .... Once daily 4)  Calcium 600/vitamin D 600-400 Mg-unit Tabs (Calcium carbonate-vitamin d) .... Once daily 5)  Nexium 40 Mg Cpdr (Esomeprazole magnesium) .Marland Kitchen.. 1po once daily 6)  Simcor 500-20 Mg Xr24h-tab (Niacin-simvastatin) .... One by mouth once daily 7)  Bystolic 10 Mg Tabs (Nebivolol hcl) .... Once daily 8)  Klor-con/ef 25 Meq Tbef (Potassium bicarbonate) .... One by mouth once daily 9)  Valturna 300-320 Mg Tabs (Aliskiren-valsartan) .... One by mouth once daily for high blood pressure  Lipid Assessment/Plan:      Based on NCEP/ATP III, the patient's risk factor category is "history of coronary disease, peripheral vascular disease, cerebrovascular disease, or aortic aneurysm".  The patient's lipid goals are as follows: Total cholesterol goal is 200; LDL cholesterol goal is 100; HDL cholesterol goal is 40; Triglyceride goal is 150.    Patient Instructions: 1)  Please schedule a follow-up appointment in 2 months. 2)  Check your Blood Pressure regularly. If it is above 130/80: you should make an appointment. Prescriptions: HYDROCHLOROTHIAZIDE 50 MG TABS (HYDROCHLOROTHIAZIDE) once daily for high blood pressure  #30 x 5   Entered and Authorized by:   Etta Grandchild MD   Signed by:   Etta Grandchild MD on 06/08/2009   Method used:   Print then Give to Patient   RxID:   [id number] BYSTOLIC 10 MG TABS (NEBIVOLOL HCL) once daily  #90 x 0   Entered and Authorized by:   Etta Grandchild MD   Signed by:   Etta Grandchild MD on 06/08/2009   Method used:   Samples Given   RxID:   [id number] VALTURNA 300-320 MG TABS (ALISKIREN-VALSARTAN) One by mouth once daily for high blood pressure  #42 x 0   Entered and Authorized by:   Etta Grandchild MD   Signed by:   Etta Grandchild MD on 06/08/2009   Method used:   Samples Given   RxID:   [id number]

## 2010-05-24 NOTE — Assessment & Plan Note (Signed)
Summary: cracking,peeling feet/SD   Vital Signs:  Patient profile:   70 year old female Height:      71.5 inches Weight:      184 pounds BMI:     25.40 O2 Sat:      95 % on Room air Temp:     97.0 degrees F oral Pulse rate:   59 / minute Pulse rhythm:   regular Resp:     16 per minute BP sitting:   130 / 82  (right arm) Cuff size:   large  Vitals Entered By: Rock Nephew CMA (September 27, 2009 10:17 AM)  Nutrition Counseling: Patient's BMI is greater than 25 and therefore counseled on weight management options.  O2 Flow:  Room air  Primary Care Provider:  Etta Grandchild MD   History of Present Illness: She returns c/o itching, scaling, cracking in her left foot. The LLE swelling is unchanged.  Hypertension History:      She denies headache, chest pain, palpitations, dyspnea with exertion, orthopnea, PND, visual symptoms, neurologic problems, syncope, and side effects from treatment.  She notes no problems with any antihypertensive medication side effects.        Positive major cardiovascular risk factors include female age 38 years old or older, hyperlipidemia, and hypertension.  Negative major cardiovascular risk factors include no history of diabetes, negative family history for ischemic heart disease, and non-tobacco-user status.        Positive history for target organ damage include prior stroke (or TIA).  Further assessment for target organ damage reveals no history of ASHD, cardiac end-organ damage (CHF/LVH), peripheral vascular disease, renal insufficiency, or hypertensive retinopathy.     Preventive Screening-Counseling & Management  Alcohol-Tobacco     Alcohol drinks/day: 0     Smoking Status: never  Hep-HIV-STD-Contraception     Hepatitis Risk: no risk noted     HIV Risk: no risk noted     STD Risk: no risk noted      Sexual History:  not active.        Drug Use:  no.        Blood Transfusions:  no.    Medications Prior to Update: 1)  Aspirin 81 Mg  Tabs  (Aspirin) .Marland Kitchen.. 1 By Mouth Once Daily 2)  Hydrochlorothiazide 50 Mg Tabs (Hydrochlorothiazide) .... Once Daily For High Blood Pressure 3)  Centrum Silver  Tabs (Multiple Vitamins-Minerals) .... Once Daily 4)  Calcium 600/vitamin D 600-400 Mg-Unit Tabs (Calcium Carbonate-Vitamin D) .... Once Daily 5)  Simcor 500-20 Mg Xr24h-Tab (Niacin-Simvastatin) .... One By Mouth Once Daily 6)  Bystolic 10 Mg Tabs (Nebivolol Hcl) .... Once Daily 7)  Valturna 300-320 Mg Tabs (Aliskiren-Valsartan) .... One By Mouth Once Daily For High Blood Pressure  Current Medications (verified): 1)  Aspirin 81 Mg  Tabs (Aspirin) .Marland Kitchen.. 1 By Mouth Once Daily 2)  Hydrochlorothiazide 50 Mg Tabs (Hydrochlorothiazide) .... Once Daily For High Blood Pressure 3)  Centrum Silver  Tabs (Multiple Vitamins-Minerals) .... Once Daily 4)  Calcium 600/vitamin D 600-400 Mg-Unit Tabs (Calcium Carbonate-Vitamin D) .... Once Daily 5)  Simcor 500-20 Mg Xr24h-Tab (Niacin-Simvastatin) .... One By Mouth Once Daily 6)  Bystolic 10 Mg Tabs (Nebivolol Hcl) .... Once Daily 7)  Valturna 300-320 Mg Tabs (Aliskiren-Valsartan) .... One By Mouth Once Daily For High Blood Pressure 8)  Loprox 0.77 % Gel (Ciclopirox) .... Apply To Foot Two Times A Day For 14 Days  Allergies (verified): 1)  ! * Naproxen 2)  ! Darvocet  3)  ! Hydrocodone 4)  ! * Nucynta  Past History:  Past Medical History: Last updated: 12/14/2008 Current Problems:  GALLSTONES (ICD-574.20) COPD (ICD-496) ADENOCARCINOMA, COLON, FAMILY HX (ICD-V16.0) CEREBROVASCULAR ACCIDENT, HX OF (ICD-V12.50) HYPERTENSION (ICD-401.9) "enlarged heart" per pt chronic edema- ? venous insufficiency GERD Hyperlipidemia  Past Surgical History: Last updated: 02/11/2009 hysterectomy Cholecystectomy  Family History: Last updated: 04/30/2008 No FH of Colon Cancer: Family History of Heart Disease: father Family History of Liver Disease/Cirrhosis:mother  Social History: Last updated:  04/30/2008 Patient has never smoked.  Alcohol Use - no Illicit Drug Use - no Patient does not get regular exercise.   Risk Factors: Alcohol Use: 0 (09/27/2009) Exercise: no (04/30/2008)  Risk Factors: Smoking Status: never (09/27/2009)  Family History: Reviewed history from 04/30/2008 and no changes required. No FH of Colon Cancer: Family History of Heart Disease: father Family History of Liver Disease/Cirrhosis:mother  Social History: Reviewed history from 04/30/2008 and no changes required. Patient has never smoked.  Alcohol Use - no Illicit Drug Use - no Patient does not get regular exercise.   Review of Systems  The patient denies anorexia, fever, weight loss, abdominal pain, suspicious skin lesions, abnormal bleeding, and enlarged lymph nodes.   Derm:  Complains of dryness, itching, and rash; denies changes in color of skin, changes in nail beds, flushing, hair loss, insect bite(s), lesion(s), and poor wound healing.  Physical Exam  General:  alert, well-developed, well-nourished, and well-hydrated.   Head:  normocephalic and atraumatic.   Mouth:  Oral mucosa and oropharynx without lesions or exudates.  Teeth in good repair. Neck:  supple, full ROM, no masses, no carotid bruits, and no neck tenderness.   Lungs:  Normal respiratory effort, chest expands symmetrically. Lungs are clear to auscultation, no crackles or wheezes. Heart:  normal rate and regular rhythm.  with grade 1-2 systolic murmur heard best left upper sternal border, no radiation, no gallop or lift Abdomen:  Bowel sounds positive,abdomen soft and non-tender without masses, organomegaly or hernias noted. Msk:  No deformity or scoliosis noted of thoracic or lumbar spine.   Skin:  left foot has mild scaling over the sides and plantar surface, there is no involvement of the web spaces and there is no erythema, vesicles, lichenification, exudate, vesicles. right foot and hands are normal. Cervical Nodes:  no  anterior cervical adenopathy and no posterior cervical adenopathy.   Inguinal Nodes:  no R inguinal adenopathy and no L inguinal adenopathy.   Psych:  Cognition and judgment appear intact. Alert and cooperative with normal attention span and concentration. No apparent delusions, illusions, hallucinations   Impression & Recommendations:  Problem # 1:  TINEA PEDIS (ICD-110.4) Assessment New  Her updated medication list for this problem includes:    Loprox 0.77 % Gel (Ciclopirox) .Marland Kitchen... Apply to foot two times a day for 14 days  Take medication as directed for full duration.   Problem # 2:  HYPERTENSION (ICD-401.9) Assessment: Improved  Her updated medication list for this problem includes:    Hydrochlorothiazide 50 Mg Tabs (Hydrochlorothiazide) ..... Once daily for high blood pressure    Bystolic 10 Mg Tabs (Nebivolol hcl) ..... Once daily    Valturna 300-320 Mg Tabs (Aliskiren-valsartan) ..... One by mouth once daily for high blood pressure  BP today: 130/82 Prior BP: 128/84 (09/14/2009)  Prior 10 Yr Risk Heart Disease: 9 % (09/14/2009)  Labs Reviewed: K+: 4.2 (06/08/2009) Creat: : 0.7 (06/08/2009)   Chol: 184 (09/28/2008)   HDL: 54.00 (09/28/2008)   LDL:  112 (09/28/2008)   TG: 92.0 (09/28/2008)  Complete Medication List: 1)  Aspirin 81 Mg Tabs (Aspirin) .Marland Kitchen.. 1 by mouth once daily 2)  Hydrochlorothiazide 50 Mg Tabs (Hydrochlorothiazide) .... Once daily for high blood pressure 3)  Centrum Silver Tabs (Multiple vitamins-minerals) .... Once daily 4)  Calcium 600/vitamin D 600-400 Mg-unit Tabs (Calcium carbonate-vitamin d) .... Once daily 5)  Simcor 500-20 Mg Xr24h-tab (Niacin-simvastatin) .... One by mouth once daily 6)  Bystolic 10 Mg Tabs (Nebivolol hcl) .... Once daily 7)  Valturna 300-320 Mg Tabs (Aliskiren-valsartan) .... One by mouth once daily for high blood pressure 8)  Loprox 0.77 % Gel (Ciclopirox) .... Apply to foot two times a day for 14 days  Hypertension  Assessment/Plan:      The patient's hypertensive risk group is category C: Target organ damage and/or diabetes.  Her calculated 10 year risk of coronary heart disease is 9 %.  Today's blood pressure is 130/82.  Her blood pressure goal is < 140/90.  Patient Instructions: 1)  Please schedule a follow-up appointment in 2 months. 2)  Check your Blood Pressure regularly. If it is above: you should make an appointment. Prescriptions: LOPROX 0.77 % GEL (CICLOPIROX) Apply to foot two times a day for 14 days  #60 gms x 1   Entered and Authorized by:   Etta Grandchild MD   Signed by:   Etta Grandchild MD on 09/27/2009   Method used:   Electronically to        [facility]* (retail)       [address]       [place], [place]  [address]       Ph: [phone]       Fax: [phone]   RxID:   [id number]

## 2010-05-24 NOTE — Progress Notes (Signed)
  Phone Note Call from Patient Call back at Home Phone [phone]   Caller: Daughter Call For: Etta Grandchild MD Summary of Call: Pt's daughter came into the office and stated that we called in Vanturna 150-160 MG in for patient, but that is too costly for pt and pt was wondering if there is anything cheaper and similar to [place]. Pt also requesting a sample of Bystolic and if we have any sample of Vanturna as well.  Initial call taken by: Livingston Diones,  May 25, 2009 4:33 PM  Follow-up for Phone Call        samples given until further advisement Follow-up by: Ami Bullins CMA,  May 25, 2009 4:58 PM

## 2010-05-24 NOTE — Progress Notes (Signed)
Summary: MED NOT COVERED  Phone Note Call from Patient   Caller: Daughte Eunice Blase [phone] Summary of Call: Rx given today is too expensive, insurance will not pay. Patient is requesting alternative.  Initial call taken by: Lamar Sprinkles, CMA,  September 27, 2009 3:53 PM  Follow-up for Phone Call        get over the counter lamisil and apply two times a day  Follow-up by: Etta Grandchild MD,  September 27, 2009 3:57 PM  Additional Follow-up for Phone Call Additional follow up Details #1::        Pt's daughter informed Additional Follow-up by: Lamar Sprinkles, CMA,  September 27, 2009 5:52 PM

## 2010-05-24 NOTE — Progress Notes (Signed)
Summary: Clearance  Phone Note From Other Clinic   Caller: [facility]/Dr Dwain Sarna Summary of Call: Dr Dwain Sarna needs clearance for pt to have gallbladder removed. Original request was faxed to [place] 7/07. Phone [phone] [place] Fax [phone] Initial call taken by: Lamar Sprinkles,  November 17, 2008 10:04 AM

## 2010-05-24 NOTE — Assessment & Plan Note (Signed)
Summary: 3 MO ROV /NWS  RS'D PER PT/NWS   Vital Signs:  Patient profile:   70 year old female Height:      71.5 inches Weight:      185.75 pounds BMI:     25.64 O2 Sat:      96 % on Room air Temp:     97.8 degrees F oral Pulse rate:   62 / minute Pulse rhythm:   regular Resp:     16 per minute BP sitting:   126 / 72  (left arm) Cuff size:   large  Vitals Entered By: Rock Nephew CMA (December 20, 2009 11:26 AM)  Nutrition Counseling: Patient's BMI is greater than 25 and therefore counseled on weight management options.  O2 Flow:  Room air CC: follow-up visit, Lipid Management, Abdominal Pain Is Patient Diabetic? No Pain Assessment Patient in pain? no        Primary Care Provider:  Etta Grandchild MD  CC:  follow-up visit, Lipid Management, and Abdominal Pain.  History of Present Illness:  Follow-Up Visit      This is a 70 year old woman who presents for Follow-up visit.  The patient denies chest pain, palpitations, dizziness, syncope, SOB, DOE, PND, and orthopnea.  Since the last visit the patient notes no new problems or concerns.  The patient reports taking meds as prescribed, monitoring BP, and dietary compliance.  When questioned about possible medication side effects, the patient notes none.    Dyspepsia History:      She has no alarm features of dyspepsia including no history of melena, hematochezia, dysphagia, persistent vomiting, or involuntary weight loss > 5%.  There is a prior history of GERD.  The patient does not have a prior history of documented ulcer disease.  The dominant symptom is heartburn or acid reflux.  An H-2 blocker medication is not currently being taken.    Lipid Management History:      Positive NCEP/ATP III risk factors include female age 44 years old or older, hypertension, and prior stroke (or TIA).  Negative NCEP/ATP III risk factors include non-diabetic, no family history for ischemic heart disease, non-tobacco-user status, no ASHD  (atherosclerotic heart disease), no peripheral vascular disease, and no history of aortic aneurysm.        The patient states that she knows about the "Therapeutic Lifestyle Change" diet.  Her compliance with the TLC diet is fair.  The patient expresses understanding of adjunctive measures for cholesterol lowering.  Adjunctive measures started by the patient include fiber, ASA, limit alcohol consumpton, and weight reduction.  She expresses no side effects from her lipid-lowering medication.  The patient denies any symptoms to suggest myopathy or liver disease.      Preventive Screening-Counseling & Management  Alcohol-Tobacco     Alcohol drinks/day: 0     Smoking Status: never     Tobacco Counseling: not indicated; no tobacco use  Hep-HIV-STD-Contraception     Hepatitis Risk: no risk noted     HIV Risk: no risk noted     STD Risk: no risk noted      Sexual History:  not active.        Drug Use:  no.        Blood Transfusions:  no.    Clinical Review Panels:  Diabetes Management   Creatinine:  0.7 (06/08/2009)  CBC   WBC:  4.3 (09/08/2008)   RBC:  4.37 (09/08/2008)   Hgb:  12.9 (09/08/2008)   Hct:  37.4 (09/08/2008)   Platelets:  191.0 (09/08/2008)   MCV  85.6 (09/08/2008)   MCHC  34.5 (09/08/2008)   RDW  11.9 (09/08/2008)   PMN:  41.9 (09/08/2008)   Lymphs:  49.6 (09/08/2008)   Monos:  6.9 (09/08/2008)   Eosinophils:  1.4 (09/08/2008)   Basophil:  0.2 (09/08/2008)  Complete Metabolic Panel   Glucose:  113 (06/08/2009)   Sodium:  145 (06/08/2009)   Potassium:  4.2 (06/08/2009)   Chloride:  105 (06/08/2009)   CO2:  33 (06/08/2009)   BUN:  14 (06/08/2009)   Creatinine:  0.7 (06/08/2009)   Albumin:  4.0 (06/08/2009)   Total Protein:  7.3 (06/08/2009)   Calcium:  9.5 (06/08/2009)   Total Bili:  0.8 (06/08/2009)   Alk Phos:  58 (06/08/2009)   SGPT (ALT):  18 (06/08/2009)   SGOT (AST):  21 (06/08/2009)   Medications Prior to Update: 1)  Aspirin 81 Mg  Tabs  (Aspirin) .Marland Kitchen.. 1 By Mouth Once Daily 2)  Hydrochlorothiazide 50 Mg Tabs (Hydrochlorothiazide) .... Once Daily For High Blood Pressure 3)  Centrum Silver  Tabs (Multiple Vitamins-Minerals) .... Once Daily 4)  Calcium 600/vitamin D 600-400 Mg-Unit Tabs (Calcium Carbonate-Vitamin D) .... Once Daily 5)  Simcor 500-20 Mg Xr24h-Tab (Niacin-Simvastatin) .... One By Mouth Once Daily 6)  Bystolic 10 Mg Tabs (Nebivolol Hcl) .... Once Daily 7)  Valturna 300-320 Mg Tabs (Aliskiren-Valsartan) .... One By Mouth Once Daily For High Blood Pressure 8)  Loprox 0.77 % Gel (Ciclopirox) .... Apply To Foot Two Times A Day For 14 Days  Current Medications (verified): 1)  Aspirin 81 Mg  Tabs (Aspirin) .Marland Kitchen.. 1 By Mouth Once Daily 2)  Hydrochlorothiazide 50 Mg Tabs (Hydrochlorothiazide) .... Once Daily For High Blood Pressure 3)  Centrum Silver  Tabs (Multiple Vitamins-Minerals) .... Once Daily 4)  Calcium 600/vitamin D 600-400 Mg-Unit Tabs (Calcium Carbonate-Vitamin D) .... Once Daily 5)  Simcor 500-20 Mg Xr24h-Tab (Niacin-Simvastatin) .... One By Mouth Once Daily 6)  Bystolic 10 Mg Tabs (Nebivolol Hcl) .... Once Daily 7)  Valturna 300-320 Mg Tabs (Aliskiren-Valsartan) .... One By Mouth Once Daily For High Blood Pressure 8)  Loprox 0.77 % Gel (Ciclopirox) .... Apply To Foot Two Times A Day For 14 Days 9)  Toviaz 4 Mg Xr24h-Tab (Fesoterodine Fumarate) .... One By Mouth Once Daily For Overactive Bladder  Allergies (verified): 1)  ! * Naproxen 2)  ! Darvocet 3)  ! Hydrocodone 4)  ! * Nucynta  Past History:  Past Medical History: Last updated: 12/14/2008 Current Problems:  GALLSTONES (ICD-574.20) COPD (ICD-496) ADENOCARCINOMA, COLON, FAMILY HX (ICD-V16.0) CEREBROVASCULAR ACCIDENT, HX OF (ICD-V12.50) HYPERTENSION (ICD-401.9) "enlarged heart" per pt chronic edema- ? venous insufficiency GERD Hyperlipidemia  Past Surgical History: Last updated: 02/11/2009 hysterectomy Cholecystectomy  Family  History: Last updated: 04/30/2008 No FH of Colon Cancer: Family History of Heart Disease: father Family History of Liver Disease/Cirrhosis:mother  Social History: Last updated: 04/30/2008 Patient has never smoked.  Alcohol Use - no Illicit Drug Use - no Patient does not get regular exercise.   Risk Factors: Alcohol Use: 0 (12/20/2009) Exercise: no (04/30/2008)  Risk Factors: Smoking Status: never (12/20/2009)  Family History: Reviewed history from 04/30/2008 and no changes required. No FH of Colon Cancer: Family History of Heart Disease: father Family History of Liver Disease/Cirrhosis:mother  Social History: Reviewed history from 04/30/2008 and no changes required. Patient has never smoked.  Alcohol Use - no Illicit Drug Use - no Patient does not get regular exercise.   Review of  Systems       The patient complains of incontinence.  The patient denies anorexia, fever, weight loss, weight gain, chest pain, syncope, dyspnea on exertion, peripheral edema, prolonged cough, headaches, hemoptysis, abdominal pain, hematuria, difficulty walking, and depression.   GU:  Complains of incontinence, nocturia, and urinary frequency; denies dysuria, hematuria, and urinary hesitancy.  Physical Exam  General:  alert, well-developed, well-nourished, and well-hydrated.   Mouth:  Oral mucosa and oropharynx without lesions or exudates.  Teeth in good repair. Neck:  supple, full ROM, no masses, no carotid bruits, and no neck tenderness.   Lungs:  Normal respiratory effort, chest expands symmetrically. Lungs are clear to auscultation, no crackles or wheezes. Heart:  normal rate and regular rhythm.  with grade 1-2 systolic murmur heard best left upper sternal border, no radiation, no gallop or lift Abdomen:  Bowel sounds positive,abdomen soft and non-tender without masses, organomegaly or hernias noted. Msk:  No deformity or scoliosis noted of thoracic or lumbar spine.   Pulses:  R and L  carotid,radial,femoral,dorsalis pedis and posterior tibial pulses are full and equal bilaterally Extremities:  No clubbing, cyanosis, edema, or deformity noted with normal full range of motion of all joints.   Neurologic:  alert & oriented X3, cranial nerves II-XII intact, RUE hyporeflexia, RLE hyporeflexia, LUE hyperreflexia, LUE weakness, LLE hyperreflexia, and LLE weakness.   Skin:  left foot has mild scaling over the sides and plantar surface, there is no involvement of the web spaces and there is no erythema, vesicles, lichenification, exudate, vesicles. right foot and hands are normal. Cervical Nodes:  no anterior cervical adenopathy and no posterior cervical adenopathy.   Psych:  Cognition and judgment appear intact. Alert and cooperative with normal attention span and concentration. No apparent delusions, illusions, hallucinations   Impression & Recommendations:  Problem # 1:  HYPERTONICITY OF BLADDER (ICD-596.51) Assessment New start Toviaz Orders: Venipuncture (30865) TLB-BMP (Basic Metabolic Panel-BMET) (80048-METABOL) TLB-CBC Platelet - w/Differential (85025-CBCD) TLB-Hepatic/Liver Function Pnl (80076-HEPATIC) TLB-Lipid Panel (80061-LIPID) TLB-Udip w/ Micro (81001-URINE)  Problem # 2:  HYPERTENSION (ICD-401.9) Assessment: Improved  Her updated medication list for this problem includes:    Hydrochlorothiazide 50 Mg Tabs (Hydrochlorothiazide) ..... Once daily for high blood pressure    Bystolic 10 Mg Tabs (Nebivolol hcl) ..... Once daily    Valturna 300-320 Mg Tabs (Aliskiren-valsartan) ..... One by mouth once daily for high blood pressure  Orders: Venipuncture (78469) TLB-BMP (Basic Metabolic Panel-BMET) (80048-METABOL) TLB-CBC Platelet - w/Differential (85025-CBCD) TLB-Hepatic/Liver Function Pnl (80076-HEPATIC) TLB-Lipid Panel (80061-LIPID) TLB-Udip w/ Micro (81001-URINE)  BP today: 126/72 Prior BP: 130/82 (09/27/2009)  Prior 10 Yr Risk Heart Disease: 9 %  (09/14/2009)  Labs Reviewed: K+: 4.2 (06/08/2009) Creat: : 0.7 (06/08/2009)   Chol: 184 (09/28/2008)   HDL: 54.00 (09/28/2008)   LDL: 112 (09/28/2008)   TG: 92.0 (09/28/2008)  Problem # 3:  HYPERLIPIDEMIA (ICD-272.4) Assessment: Unchanged  Her updated medication list for this problem includes:    Simcor 500-20 Mg Xr24h-tab (Niacin-simvastatin) ..... One by mouth once daily  Orders: Venipuncture (62952) TLB-BMP (Basic Metabolic Panel-BMET) (80048-METABOL) TLB-CBC Platelet - w/Differential (85025-CBCD) TLB-Hepatic/Liver Function Pnl (80076-HEPATIC) TLB-Lipid Panel (80061-LIPID) TLB-Udip w/ Micro (81001-URINE)  Labs Reviewed: SGOT: 21 (06/08/2009)   SGPT: 18 (06/08/2009)  Lipid Goals: Chol Goal: 200 (02/11/2009)   HDL Goal: 40 (02/11/2009)   LDL Goal: 100 (02/11/2009)   TG Goal: 150 (02/11/2009)  Prior 10 Yr Risk Heart Disease: 9 % (09/14/2009)   HDL:54.00 (09/28/2008)  LDL:112 (09/28/2008)  Chol:184 (09/28/2008)  Trig:92.0 (09/28/2008)  Complete Medication List: 1)  Aspirin 81 Mg Tabs (Aspirin) .Marland Kitchen.. 1 by mouth once daily 2)  Hydrochlorothiazide 50 Mg Tabs (Hydrochlorothiazide) .... Once daily for high blood pressure 3)  Centrum Silver Tabs (Multiple vitamins-minerals) .... Once daily 4)  Calcium 600/vitamin D 600-400 Mg-unit Tabs (Calcium carbonate-vitamin d) .... Once daily 5)  Simcor 500-20 Mg Xr24h-tab (Niacin-simvastatin) .... One by mouth once daily 6)  Bystolic 10 Mg Tabs (Nebivolol hcl) .... Once daily 7)  Valturna 300-320 Mg Tabs (Aliskiren-valsartan) .... One by mouth once daily for high blood pressure 8)  Loprox 0.77 % Gel (Ciclopirox) .... Apply to foot two times a day for 14 days 9)  Toviaz 4 Mg Xr24h-tab (Fesoterodine fumarate) .... One by mouth once daily for overactive bladder  Lipid Assessment/Plan:      Based on NCEP/ATP III, the patient's risk factor category is "history of coronary disease, peripheral vascular disease, cerebrovascular disease, or aortic  aneurysm".  The patient's lipid goals are as follows: Total cholesterol goal is 200; LDL cholesterol goal is 100; HDL cholesterol goal is 40; Triglyceride goal is 150.    Patient Instructions: 1)  Please schedule a follow-up appointment in 3 months. 2)  Check your Blood Pressure regularly. If it is above 130/80: you should make an appointment. Prescriptions: TOVIAZ 4 MG XR24H-TAB (FESOTERODINE FUMARATE) One by mouth once daily for overactive bladder  #56 x 0   Entered and Authorized by:   Etta Grandchild MD   Signed by:   Etta Grandchild MD on 12/20/2009   Method used:   Samples Given   RxID:   [id number]

## 2010-05-26 NOTE — Progress Notes (Signed)
  Phone Note Refill Request Message from:  Fax from Pharmacy on April 29, 2010 11:48 AM  Refills Requested: Medication #1:  HYDROCHLOROTHIAZIDE 50 MG TABS once daily for high blood pressure   Last Refilled: 01/27/2010 Initial call taken by: Rock Nephew CMA,  April 29, 2010 11:48 AM    New/Updated Medications: HYDROCHLOROTHIAZIDE 50 MG TABS (HYDROCHLOROTHIAZIDE) once daily for high blood pressure Prescriptions: HYDROCHLOROTHIAZIDE 50 MG TABS (HYDROCHLOROTHIAZIDE) once daily for high blood pressure  #90 x 3   Entered by:   Rock Nephew CMA   Authorized by:   Etta Grandchild MD   Signed by:   Rock Nephew CMA on 04/29/2010   Method used:   Electronically to        [facility] (retail)       [address] [place], [place]  [address]       Ph: [phone]       Fax: [phone]   RxID:   [id number]

## 2010-05-26 NOTE — Assessment & Plan Note (Signed)
Summary: FU Tonya [name]   Vital Signs:  Patient profile:   70 year old female Height:      71.5 inches Weight:      192 pounds O2 Sat:      98 % on Room air Temp:     98.3 degrees F oral Pulse rate:   55 / minute Pulse rhythm:   regular Resp:     16 per minute BP sitting:   130 / 82  Vitals Entered By: Rock Nephew CMA (April 26, 2010 10:24 AM)  O2 Flow:  Room air  Primary Care Provider:  Etta Grandchild MD   History of Present Illness:  Follow-Up Visit      This is a 70 year old woman who presents for Follow-up visit.  The patient denies chest pain, palpitations, dizziness, syncope, edema, SOB, DOE, and PND.  Since the last visit the patient notes no new problems or concerns.  The patient reports taking meds as prescribed, monitoring BP, and dietary compliance.  When questioned about possible medication side effects, the patient notes none.    Lipid Management History:      Positive NCEP/ATP III risk factors include female age 30 years old or older, hypertension, and prior stroke (or TIA).  Negative NCEP/ATP III risk factors include non-diabetic, no family history for ischemic heart disease, non-tobacco-user status, no ASHD (atherosclerotic heart disease), no peripheral vascular disease, and no history of aortic aneurysm.        The patient states that she knows about the "Therapeutic Lifestyle Change" diet.  Her compliance with the TLC diet is fair.  The patient expresses understanding of adjunctive measures for cholesterol lowering.  Adjunctive measures started by the patient include fiber, ASA, limit alcohol consumpton, and weight reduction.  She expresses no side effects from her lipid-lowering medication.  The patient denies any symptoms to suggest myopathy or liver disease.     Preventive Screening-Counseling & Management  Alcohol-Tobacco     Alcohol drinks/day: 0     Alcohol Counseling: not indicated; patient does not drink     Smoking Status: never     Tobacco Counseling:  not indicated; no tobacco use  Hep-HIV-STD-Contraception     Hepatitis Risk: no risk noted     HIV Risk: no risk noted     STD Risk: no risk noted      Sexual History:  not active.        Drug Use:  no.        Blood Transfusions:  no.    Clinical Review Panels:  Prevention   Last Mammogram:  ASSESSMENT: Negative - BI-RADS 1^MM DIGITAL SCREENING (03/25/2010)   Last Colonoscopy:  Normal (02/06/2006)  Immunizations   Last Tetanus Booster:  Historical (04/24/2009)  Lipid Management   Cholesterol:  184 (09/28/2008)   LDL (bad choesterol):  112 (09/28/2008)   HDL (good cholesterol):  54.00 (09/28/2008)  Diabetes Management   Creatinine:  0.7 (06/08/2009)  CBC   WBC:  4.3 (09/08/2008)   RBC:  4.37 (09/08/2008)   Hgb:  12.9 (09/08/2008)   Hct:  37.4 (09/08/2008)   Platelets:  191.0 (09/08/2008)   MCV  85.6 (09/08/2008)   MCHC  34.5 (09/08/2008)   RDW  11.9 (09/08/2008)   PMN:  41.9 (09/08/2008)   Lymphs:  49.6 (09/08/2008)   Monos:  6.9 (09/08/2008)   Eosinophils:  1.4 (09/08/2008)   Basophil:  0.2 (09/08/2008)  Complete Metabolic Panel   Glucose:  113 (06/08/2009)   Sodium:  145 (06/08/2009)   Potassium:  4.2 (06/08/2009)   Chloride:  105 (06/08/2009)   CO2:  33 (06/08/2009)   BUN:  14 (06/08/2009)   Creatinine:  0.7 (06/08/2009)   Albumin:  4.0 (06/08/2009)   Total Protein:  7.3 (06/08/2009)   Calcium:  9.5 (06/08/2009)   Total Bili:  0.8 (06/08/2009)   Alk Phos:  58 (06/08/2009)   SGPT (ALT):  18 (06/08/2009)   SGOT (AST):  21 (06/08/2009)   Medications Prior to Update: 1)  Aspirin 81 Mg  Tabs (Aspirin) .Marland Kitchen.. 1 By Mouth Once Daily 2)  Hydrochlorothiazide 50 Mg Tabs (Hydrochlorothiazide) .... Once Daily For High Blood Pressure 3)  Centrum Silver  Tabs (Multiple Vitamins-Minerals) .... Once Daily 4)  Calcium 600/vitamin D 600-400 Mg-Unit Tabs (Calcium Carbonate-Vitamin D) .... Once Daily 5)  Simcor 500-20 Mg Xr24h-Tab (Niacin-Simvastatin) .... One By Mouth  Once Daily 6)  Bystolic 10 Mg Tabs (Nebivolol Hcl) .... Once Daily 7)  Valturna 300-320 Mg Tabs (Aliskiren-Valsartan) .... One By Mouth Once Daily For High Blood Pressure 8)  Loprox 0.77 % Gel (Ciclopirox) .... Apply To Foot Two Times A Day For 14 Days 9)  Toviaz 4 Mg Xr24h-Tab (Fesoterodine Fumarate) .... One By Mouth Once Daily For Overactive Bladder  Current Medications (verified): 1)  Aspirin 81 Mg  Tabs (Aspirin) .Marland Kitchen.. 1 By Mouth Once Daily 2)  Hydrochlorothiazide 50 Mg Tabs (Hydrochlorothiazide) .... Once Daily For High Blood Pressure 3)  Centrum Silver  Tabs (Multiple Vitamins-Minerals) .... Once Daily 4)  Calcium 600/vitamin D 600-400 Mg-Unit Tabs (Calcium Carbonate-Vitamin D) .... Once Daily 5)  Bystolic 10 Mg Tabs (Nebivolol Hcl) .... Once Daily 6)  Valturna 300-320 Mg Tabs (Aliskiren-Valsartan) .... One By Mouth Once Daily For High Blood Pressure 7)  Simcor 500-40 Mg Xr24h-Tab (Niacin-Simvastatin) .... One By Mouth Once Daily For Cholesterol  Allergies (verified): 1)  ! * Naproxen 2)  ! Darvocet 3)  ! Hydrocodone 4)  ! * Nucynta  Past History:  Past Medical History: Last updated: 12/14/2008 Current Problems:  GALLSTONES (ICD-574.20) COPD (ICD-496) ADENOCARCINOMA, COLON, FAMILY HX (ICD-V16.0) CEREBROVASCULAR ACCIDENT, HX OF (ICD-V12.50) HYPERTENSION (ICD-401.9) "enlarged heart" per pt chronic edema- ? venous insufficiency GERD Hyperlipidemia  Past Surgical History: Last updated: 02/11/2009 hysterectomy Cholecystectomy  Family History: Last updated: 04/30/2008 No FH of Colon Cancer: Family History of Heart Disease: father Family History of Liver Disease/Cirrhosis:mother  Social History: Last updated: 04/30/2008 Patient has never smoked.  Alcohol Use - no Illicit Drug Use - no Patient does not get regular exercise.   Risk Factors: Alcohol Use: 0 (04/26/2010) Exercise: no (04/30/2008)  Risk Factors: Smoking Status: never (04/26/2010)  Family  History: Reviewed history from 04/30/2008 and no changes required. No FH of Colon Cancer: Family History of Heart Disease: father Family History of Liver Disease/Cirrhosis:mother  Social History: Reviewed history from 04/30/2008 and no changes required. Patient has never smoked.  Alcohol Use - no Illicit Drug Use - no Patient does not get regular exercise.   Review of Systems  The patient denies anorexia, fever, weight loss, weight gain, chest pain, syncope, dyspnea on exertion, peripheral edema, prolonged cough, headaches, hemoptysis, abdominal pain, and depression.    Physical Exam  General:  alert, well-developed, well-nourished, and well-hydrated.   Mouth:  Oral mucosa and oropharynx without lesions or exudates.  Teeth in good repair. Neck:  supple, full ROM, no masses, no carotid bruits, and no neck tenderness.   Lungs:  Normal respiratory effort, chest expands symmetrically. Lungs are clear to auscultation,  no crackles or wheezes. Heart:  normal rate and regular rhythm.  with grade 1-2 systolic murmur heard best left upper sternal border, no radiation, no gallop or lift Abdomen:  Bowel sounds positive,abdomen soft and non-tender without masses, organomegaly or hernias noted. Msk:  No deformity or scoliosis noted of thoracic or lumbar spine.   Extremities:  No clubbing, cyanosis, edema, or deformity noted with normal full range of motion of all joints.   Neurologic:  alert & oriented X3, cranial nerves II-XII intact, RUE hyporeflexia, RLE hyporeflexia, LUE hyperreflexia, LUE weakness, LLE hyperreflexia, and LLE weakness.   Skin:  turgor normal, color normal, no rashes, no suspicious lesions, no ecchymoses, no petechiae, no purpura, no ulcerations, and no edema.   Cervical Nodes:  no anterior cervical adenopathy and no posterior cervical adenopathy.   Psych:  Cognition and judgment appear intact. Alert and cooperative with normal attention span and concentration. No apparent  delusions, illusions, hallucinations   Impression & Recommendations:  Problem # 1:  HYPERLIPIDEMIA (ICD-272.4) Assessment Unchanged  The following medications were removed from the medication list:    Simcor 500-20 Mg Xr24h-tab (Niacin-simvastatin) ..... One by mouth once daily Her updated medication list for this problem includes:    Simcor 500-40 Mg Xr24h-tab (Niacin-simvastatin) ..... One by mouth once daily for cholesterol  Orders: Venipuncture (45409) TLB-Lipid Panel (80061-LIPID) TLB-BMP (Basic Metabolic Panel-BMET) (80048-METABOL) TLB-CBC Platelet - w/Differential (85025-CBCD) TLB-Hepatic/Liver Function Pnl (80076-HEPATIC) TLB-TSH (Thyroid Stimulating Hormone) (84443-TSH)  Labs Reviewed: SGOT: 21 (06/08/2009)   SGPT: 18 (06/08/2009)  Lipid Goals: Chol Goal: 200 (02/11/2009)   HDL Goal: 40 (02/11/2009)   LDL Goal: 100 (02/11/2009)   TG Goal: 150 (02/11/2009)  Prior 10 Yr Risk Heart Disease: 9 % (09/14/2009)   HDL:54.00 (09/28/2008)  LDL:112 (09/28/2008)  Chol:184 (09/28/2008)  Trig:92.0 (09/28/2008)  Problem # 2:  PERIPHERAL EDEMA (ICD-782.3) Assessment: Improved  Her updated medication list for this problem includes:    Hydrochlorothiazide 50 Mg Tabs (Hydrochlorothiazide) ..... Once daily for high blood pressure  Problem # 3:  HYPERTENSION (ICD-401.9) Assessment: Improved  Her updated medication list for this problem includes:    Hydrochlorothiazide 50 Mg Tabs (Hydrochlorothiazide) ..... Once daily for high blood pressure    Bystolic 10 Mg Tabs (Nebivolol hcl) ..... Once daily    Valturna 300-320 Mg Tabs (Aliskiren-valsartan) ..... One by mouth once daily for high blood pressure  Orders: Venipuncture (81191) TLB-Lipid Panel (80061-LIPID) TLB-BMP (Basic Metabolic Panel-BMET) (80048-METABOL) TLB-CBC Platelet - w/Differential (85025-CBCD) TLB-Hepatic/Liver Function Pnl (80076-HEPATIC) TLB-TSH (Thyroid Stimulating Hormone) (84443-TSH)  BP today: 130/82 Prior  BP: 126/72 (12/20/2009)  Prior 10 Yr Risk Heart Disease: 9 % (09/14/2009)  Labs Reviewed: K+: 4.2 (06/08/2009) Creat: : 0.7 (06/08/2009)   Chol: 184 (09/28/2008)   HDL: 54.00 (09/28/2008)   LDL: 112 (09/28/2008)   TG: 92.0 (09/28/2008)  Problem # 4:  CEREBROVASCULAR ACCIDENT, HX OF (ICD-V12.50) Assessment: Unchanged  Complete Medication List: 1)  Aspirin 81 Mg Tabs (Aspirin) .Marland Kitchen.. 1 by mouth once daily 2)  Hydrochlorothiazide 50 Mg Tabs (Hydrochlorothiazide) .... Once daily for high blood pressure 3)  Centrum Silver Tabs (Multiple vitamins-minerals) .... Once daily 4)  Calcium 600/vitamin D 600-400 Mg-unit Tabs (Calcium carbonate-vitamin d) .... Once daily 5)  Bystolic 10 Mg Tabs (Nebivolol hcl) .... Once daily 6)  Valturna 300-320 Mg Tabs (Aliskiren-valsartan) .... One by mouth once daily for high blood pressure 7)  Simcor 500-40 Mg Xr24h-tab (Niacin-simvastatin) .... One by mouth once daily for cholesterol  Lipid Assessment/Plan:      Based  on NCEP/ATP III, the patient's risk factor category is "history of coronary disease, peripheral vascular disease, cerebrovascular disease, or aortic aneurysm".  The patient's lipid goals are as follows: Total cholesterol goal is 200; LDL cholesterol goal is 100; HDL cholesterol goal is 40; Triglyceride goal is 150.    Colorectal Screening:  Colonoscopy Results:    Date of Exam: 02/06/2006    Results: Normal  PAP Screening:    Reviewed PAP smear recommendations:  patient refuses understanding risks of delayed diagnosis  Mammogram Screening:    Last Mammogram:  03/25/2010  Osteoporosis Risk Assessment:  Risk Factors for Fracture or Low Bone Density:   Smoking status:       never  Immunization & Chemoprophylaxis:    Tetanus vaccine: Historical  (04/24/2009)  Patient Instructions: 1)  Please schedule a follow-up appointment in 4 months. 2)  Check your Blood Pressure regularly. If it is above 130/80: you should make an  appointment. Prescriptions: SIMCOR 500-40 MG XR24H-TAB (NIACIN-SIMVASTATIN) One by mouth once daily for cholesterol  #140 x 0   Entered and Authorized by:   Etta Grandchild MD   Signed by:   Etta Grandchild MD on 04/26/2010   Method used:   Samples Given   RxID:   [id number]    Orders Added: 1)  Venipuncture [36415] 2)  TLB-Lipid Panel [80061-LIPID] 3)  TLB-BMP (Basic Metabolic Panel-BMET) [80048-METABOL] 4)  TLB-CBC Platelet - w/Differential [85025-CBCD] 5)  TLB-Hepatic/Liver Function Pnl [80076-HEPATIC] 6)  TLB-TSH (Thyroid Stimulating Hormone) [84443-TSH] 7)  Est. Patient Level IV [14782]   Immunization History:  Tetanus/Td Immunization History:    Tetanus/Td:  historical (04/24/2009)  Not Administered:    Influenza Vaccine not given due to: declined   Immunization History:  Tetanus/Td Immunization History:    Tetanus/Td:  Historical (04/24/2009)        Prevention & Chronic Care Immunizations   Influenza vaccine: Not documented   Influenza vaccine deferral: Refused  (04/26/2010)    Tetanus booster: 04/24/2009: Historical    Pneumococcal vaccine: Not documented   Pneumococcal vaccine deferral: Refused  (04/26/2010)    H. zoster vaccine: Not documented   H. zoster vaccine deferral: Refused  (04/26/2010)  Colorectal Screening   Hemoccult: Not documented    Colonoscopy: Normal  (02/06/2006)  Other Screening   Pap smear: Not documented   Pap smear action/deferral: patient refuses understanding risks of delayed diagnosis  (04/26/2010)    Mammogram: ASSESSMENT: Negative - BI-RADS 1^MM DIGITAL SCREENING  (03/25/2010)    DXA bone density scan: Not documented   Smoking status: never  (04/26/2010)  Lipids   Total Cholesterol: 184  (09/28/2008)   LDL: 112  (09/28/2008)   LDL Direct: Not documented   HDL: 54.00  (09/28/2008)   Triglycerides: 92.0  (09/28/2008)    SGOT (AST): 21  (06/08/2009)   SGPT (ALT): 18  (06/08/2009)   Alkaline  phosphatase: 58  (06/08/2009)   Total bilirubin: 0.8  (06/08/2009)  Hypertension   Last Blood Pressure: 130 / 82  (04/26/2010)   Serum creatinine: 0.7  (06/08/2009)   Serum potassium 4.2  (06/08/2009)  Self-Management Support :    Hypertension self-management support: Not documented    Lipid self-management support: Not documented

## 2010-05-26 NOTE — Letter (Signed)
Summary: Lipid Letter  [facility]  [address] [place], [place] [address]   Phone: [phone]  Fax: [phone]    04/27/2010  Tonya [name] [address] [place], [place]  [address]  Dear Ms. Hoben:  We have carefully reviewed your last lipid profile from 04/26/2010 and the results are noted below with a summary of recommendations for lipid management.    Cholesterol:       158     Goal: <200   HDL "good" Cholesterol:   62.95     Goal: >40   LDL "bad" Cholesterol:   96     Goal: <100   Triglycerides:       66.0     Goal: <150    your other labs good with the exception of a slightly high blood sugar    TLC Diet (Therapeutic Lifestyle Change): Saturated Fats & Transfatty acids should be kept < 7% of total calories ***Reduce Saturated Fats Polyunstaurated Fat can be up to 10% of total calories Monounsaturated Fat Fat can be up to 20% of total calories Total Fat should be no greater than 25-35% of total calories Carbohydrates should be 50-60% of total calories Protein should be approximately 15% of total calories Fiber should be at least 20-30 grams a day ***Increased fiber may help lower LDL Total Cholesterol should be < 200mg /day Consider adding plant stanol/sterols to diet (example: Benacol spread) ***A higher intake of unsaturated fat may reduce Triglycerides and Increase HDL    Adjunctive Measures (may lower LIPIDS and reduce risk of Heart Attack) include: Aerobic Exercise (20-30 minutes 3-4 times a week) Limit Alcohol Consumption Weight Reduction Aspirin 75-81 mg a day by mouth (if not allergic or contraindicated) Dietary Fiber 20-30 grams a day by mouth     Current Medications: 1)    Aspirin 81 Mg  Tabs (Aspirin) .Marland Kitchen.. 1 by mouth once daily 2)    Hydrochlorothiazide 50 Mg Tabs (Hydrochlorothiazide) .... Once daily for high blood pressure 3)    Centrum Silver  Tabs (Multiple vitamins-minerals) .... Once daily 4)    Calcium 600/vitamin D 600-400  Mg-unit Tabs (Calcium carbonate-vitamin d) .... Once daily 5)    Bystolic 10 Mg Tabs (Nebivolol hcl) .... Once daily 6)    Valturna 300-320 Mg Tabs (Aliskiren-valsartan) .... One by mouth once daily for high blood pressure 7)    Simcor 500-40 Mg Xr24h-tab (Niacin-simvastatin) .... One by mouth once daily for cholesterol  If you have any questions, please call. We appreciate being able to work with you.   Sincerely,     [facility] Etta Grandchild MD

## 2010-06-21 ENCOUNTER — Telehealth: Payer: Self-pay | Admitting: Internal Medicine

## 2010-06-30 NOTE — Progress Notes (Signed)
Summary: Gemma Payor?   Phone Note Call from Patient   Summary of Call: Lollie Sails stopped by today req samples of valturna.  Initial call taken by: Lamar Sprinkles, CMA,  June 21, 2010 1:27 PM  Follow-up for Phone Call        Gemma Payor is being taken off the market Follow-up by: Etta Grandchild MD,  June 21, 2010 5:56 PM  Additional Follow-up for Phone Call Additional follow up Details #1::        What would you like alt to be?  Additional Follow-up by: Lamar Sprinkles, CMA,  June 21, 2010 6:42 PM    Additional Follow-up for Phone Call Additional follow up Details #2::    Pt informed of new rx, samples ready Follow-up by: Lamar Sprinkles, CMA,  June 22, 2010 4:10 PM  New/Updated Medications: EXFORGE 5-160 MG TABS (AMLODIPINE BESYLATE-VALSARTAN) One by mouth once daily for high blood pressure Prescriptions: EXFORGE 5-160 MG TABS (AMLODIPINE BESYLATE-VALSARTAN) One by mouth once daily for high blood pressure  #84 x 0   Entered and Authorized by:   Etta Grandchild MD   Signed by:   Etta Grandchild MD on 06/21/2010   Method used:   Samples Given   RxID:   [id number]

## 2010-07-29 LAB — COMPREHENSIVE METABOLIC PANEL
ALT: 16 U/L (ref 0–35)
Albumin: 4 g/dL (ref 3.5–5.2)
Alkaline Phosphatase: 62 U/L (ref 39–117)
BUN: 15 mg/dL (ref 6–23)
Calcium: 9.6 mg/dL (ref 8.4–10.5)
Creatinine, Ser: 0.76 mg/dL (ref 0.4–1.2)
GFR calc non Af Amer: >60 mL/min (ref >60)
Total Bilirubin: 0.8 mg/dL (ref 0.3–1.2)
Total Protein: 7 g/dL (ref 6.0–8.3)

## 2010-07-29 LAB — DIFFERENTIAL
Basophils Relative: 0 % (ref 0–1)
Eosinophils Absolute: 0.2 10*3/uL (ref 0.0–0.7)
Lymphocytes Relative: 49 % — ABNORMAL HIGH (ref 12–46)
Monocytes Relative: 8 % (ref 3–12)

## 2010-07-29 LAB — CBC
MCHC: 32.6 g/dL (ref 30.0–36.0)
MCV: 87.9 fL (ref 78.0–100.0)
RBC: 4.45 MIL/uL (ref 3.87–5.11)
RDW: 12.8 % (ref 11.5–15.5)

## 2010-08-29 ENCOUNTER — Encounter: Payer: Self-pay | Admitting: Internal Medicine

## 2010-08-30 ENCOUNTER — Ambulatory Visit: Payer: Self-pay | Admitting: Internal Medicine

## 2010-08-31 ENCOUNTER — Ambulatory Visit (INDEPENDENT_AMBULATORY_CARE_PROVIDER_SITE_OTHER): Payer: Medicare PPO | Admitting: Internal Medicine

## 2010-08-31 ENCOUNTER — Encounter: Payer: Self-pay | Admitting: Internal Medicine

## 2010-08-31 ENCOUNTER — Ambulatory Visit (INDEPENDENT_AMBULATORY_CARE_PROVIDER_SITE_OTHER)
Admission: RE | Admit: 2010-08-31 | Discharge: 2010-08-31 | Disposition: A | Discharge status: Home / Self Care | Payer: Medicare PPO | Source: Ambulatory Visit | Attending: Internal Medicine | Admitting: Internal Medicine

## 2010-08-31 VITALS — BP 138/64 | HR 60 | Temp 97.8°F | Resp 16 | Wt 195.8 lb

## 2010-08-31 DIAGNOSIS — Z8679 Personal history of other diseases of the circulatory system: Secondary | ICD-10-CM

## 2010-08-31 DIAGNOSIS — I10 Essential (primary) hypertension: Secondary | ICD-10-CM

## 2010-08-31 DIAGNOSIS — M79672 Pain in left foot: Secondary | ICD-10-CM

## 2010-08-31 DIAGNOSIS — M79609 Pain in unspecified limb: Secondary | ICD-10-CM

## 2010-08-31 DIAGNOSIS — E785 Hyperlipidemia, unspecified: Secondary | ICD-10-CM

## 2010-08-31 NOTE — Assessment & Plan Note (Signed)
Her BP is well controlled, continue her current regimen

## 2010-08-31 NOTE — Assessment & Plan Note (Signed)
She is doing well on Simcor

## 2010-08-31 NOTE — Progress Notes (Signed)
Subjective:    Patient ID: Tonya [name], female    DOB: Nov 11, 1940, 70 y.o.   MRN: [id number]  Hypertension This is a chronic problem. The current episode started more than 1 year ago. The problem has been gradually improving since onset. The problem is controlled. Associated symptoms include peripheral edema. Pertinent negatives include no anxiety, blurred vision, chest pain, headaches, malaise/fatigue, neck pain, orthopnea, palpitations, PND, shortness of breath or sweats. Past treatments include angiotensin blockers, calcium channel blockers, diuretics and beta blockers. The current treatment provides significant improvement. There are no compliance problems.   Also, she has had left foot pain (burning at rest) for several weeks and she wants to get an Xray done. She thinks she may have injured it.    Review of Systems  Constitutional: Negative for fever, chills, malaise/fatigue, diaphoresis, activity change, appetite change, fatigue and unexpected weight change.  HENT: Negative for trouble swallowing, neck pain and voice change.   Eyes: Negative for blurred vision.  Respiratory: Negative for apnea, cough, choking, chest tightness, shortness of breath and stridor.   Cardiovascular: Negative for chest pain, palpitations, orthopnea, leg swelling and PND.  Gastrointestinal: Negative for nausea, vomiting, abdominal pain, diarrhea, constipation, blood in stool, abdominal distention and anal bleeding.  Genitourinary: Negative for dysuria, urgency, hematuria, decreased urine volume, enuresis, difficulty urinating and dyspareunia.  Musculoskeletal: Positive for arthralgias (left foot). Negative for myalgias, back pain, joint swelling and gait problem.  Skin: Negative for color change, pallor and rash.  Neurological: Negative for dizziness, tremors, seizures, syncope, facial asymmetry, speech difficulty, light-headedness, numbness and headaches.  Hematological: Negative for adenopathy. Does not  bruise/bleed easily.  Psychiatric/Behavioral: Negative for suicidal ideas, hallucinations, behavioral problems, confusion, sleep disturbance, self-injury, dysphoric mood, decreased concentration and agitation. The patient is not nervous/anxious and is not hyperactive.        Objective:   Physical Exam  Constitutional: She appears well-developed and well-nourished. No distress.  HENT:  Head: Normocephalic and atraumatic.  Right Ear: External ear normal.  Left Ear: External ear normal.  Nose: Nose normal.  Mouth/Throat: Oropharynx is clear and moist. No oropharyngeal exudate.  Eyes: Conjunctivae and EOM are normal. Pupils are equal, round, and reactive to light. Right eye exhibits no discharge. Left eye exhibits no discharge. No scleral icterus.  Neck: Normal range of motion. Neck supple. No JVD present. No tracheal deviation present. No thyromegaly present.  Cardiovascular: Normal rate, regular rhythm, normal heart sounds and intact distal pulses.  Exam reveals no gallop and no friction rub.   No murmur heard. Pulmonary/Chest: Breath sounds normal. No stridor. No respiratory distress. She has no wheezes. She has no rales. She exhibits no tenderness.  Abdominal: Soft. Bowel sounds are normal. She exhibits no distension and no mass. There is no tenderness. There is no rebound and no guarding.  Musculoskeletal: Normal range of motion.       Right ankle: Normal. She exhibits normal range of motion. no tenderness. Achilles tendon exhibits no pain, no defect and normal Thompson's test results.       Left ankle: Normal. She exhibits normal range of motion, no ecchymosis, no deformity, no laceration and normal pulse. no tenderness. Achilles tendon exhibits no pain, no defect and normal Thompson's test results.  Lymphadenopathy:    She has no cervical adenopathy.  Neurological: She is alert. She displays atrophy (LLE from the CVA). She displays no tremor. No cranial nerve deficit. She exhibits  abnormal muscle tone (left LE from the CAV). She displays no seizure  activity. Coordination normal.  Reflex Scores:      Tricep reflexes are 1+ on the right side and 2+ on the left side.      Bicep reflexes are 1+ on the right side and 2+ on the left side.      Brachioradialis reflexes are 1+ on the right side and 2+ on the left side.      Patellar reflexes are 1+ on the right side and 2+ on the left side.      Achilles reflexes are 1+ on the right side and 2+ on the left side. Skin: Skin is warm and dry. No rash noted. She is not diaphoretic. No erythema. No pallor.  Psychiatric: She has a normal mood and affect. Her behavior is normal. Judgment and thought content normal.        Lab Results  Component Value Date   WBC 4.5 04/26/2010   HGB 13.0 04/26/2010   HCT 39.2 04/26/2010   PLT 204.0 04/26/2010   CHOL 158 04/26/2010   TRIG 66.0 04/26/2010   HDL 48.50 04/26/2010   ALT 16 04/26/2010   AST 22 04/26/2010   NA 141 04/26/2010   K 4.3 04/26/2010   CL 103 04/26/2010   CREATININE 0.5 04/26/2010   BUN 14 04/26/2010   CO2 31 04/26/2010   TSH 2.17 04/26/2010    Assessment & Plan:

## 2010-08-31 NOTE — Assessment & Plan Note (Addendum)
Will check an xray and get a Podiatry referral

## 2010-08-31 NOTE — Assessment & Plan Note (Signed)
No changes

## 2010-08-31 NOTE — Patient Instructions (Signed)

## 2010-09-06 NOTE — Assessment & Plan Note (Signed)
A 70 year old female with left spastic hemiplegia from stroke.  She also  has a history of lumbar radiculopathy, but this has improved with the  right L4-5 transforaminal injection on June 18, 2008.  We did the  left tibial nerve block with Marcaine, but this did not produced any  measurable effect to the left lower extremity.  She continues to have  her left foot turning over and dragging somewhat.   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   REVIEW OF SYSTEMS:  Positive for night sweats.  Otherwise negative.   SOCIAL HISTORY:  Widowed.   Examination; blood pressure 132/80, pulse 57, respirations 18, O2 sat  98% on room air.  A well-developed, well-nourished female in no acute  stress.  Orientation x3.  Affect is alert.  Gait is with a limp with  equinovarus positioning in the left lower extremity.  She has no  evidence of knee instability.  On further inspection, she has mainly  varus positioning with prominence of tibial anterior tendon.   Sensation is intact in the lower extremities.  She has a mild amount of  edema in the left lower extremity.  No tenderness to palpation in the  calf area.   IMPRESSION:  1. Left spastic hemiplegia with gait disorder.  She has not responded      well to Marcaine block.  We have gone over treatment options      including physical therapy, which she has tried; oral agents, which      caused drowsiness or trial of Botox injection, tibialis anterior      and tibialis posterior 50 units into each site.  We discussed pros      and cons, she elects to proceed with Botox.  We will get approval      and is put on a schedule for next month.  2. In terms of her radicular pain in the right lower extremity, no      further recurrence thus far.  If this does recur, we would repeat      L4-5 transforaminal.      Erick Colace, M.D.  Electronically Signed     AEK/MedQ  D:  10/15/2008 14:27:40  T:  10/16/2008 02:07:47   Job #:  [id number]

## 2010-09-06 NOTE — Group Therapy Note (Signed)
CHIEF COMPLAINT:  Low back pain as well as right hip and leg pain.   HISTORY:  This 70 year old female with prior history of left spastic  hemiplegia due to pontine CVA on the right side 2001.  She went to  inpatient rehab, was seen by Dr. Thomasena Edis and discharged home.  She had  done relatively well until a couple of months ago when she had  increasing back pain as well as right lower extremity pain.  She denies  any left lower extremity discomfort.   She has been trialed on naproxen, tramadol, hydrocodone, and Nucynta all  of which make her feel sick.  She states she has both nausea as well as  some vomiting.   She has just started some physical therapy.  Her average pain is 6/10,  but 10/10 when walking.  Her pain is in the back of right hip as well as  right knee area and to a lesser extent left hip area.   Her sleep is poor, relief meds is poor, and she could walk 30 minutes at  a time.  She climbs steps and she drives.  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   REVIEW OF SYSTEMS:  No bowel or bladder dysfunction.  No give away  weakness of the leg.   PAST MEDICAL HISTORY:  As noted above, left pontine stroke.   SOCIAL HISTORY:  Widow, lives alone, continues to work.   FAMILY HISTORY:  Heart disease, diabetes, high blood pressure.   PHYSICAL EXAMINATION:  VITAL SIGNS:  Her blood pressure is 147/80, pulse  70, respirations 18.  O2 sat 99% on room air.  GENERAL:  Well-developed, well-nourished female, in no acute distress,  orientation x3.  Affect alert.  She is with a limp, using a cane.  Her  coordination normal, deep tendon reflexes are hyperreflexic on the left  side in the upper and lower extremity and 2+ on the right side.  Her  sensation is normal in the upper and lower extremities to pinprick.  Her  motor strength is 5/5 in the right upper and lower extremity.  On the  left side, she has 3- at the deltoid, biceps, triceps, 4- at the grip,  and 2- at  the left ankle dorsiflexors, 3- in the knee extensor, and 3-  at the hip flexor.   Review of MRI lumbar spine which I explained to the patient using a  spine model.  She has evidence of moderate narrowing L4-5 neural  foramina with an eccentric right foraminal disk protrusion compressing  the exiting right L4 nerve root.  He has a history of disk fragment,  left lateral recess with caudal extension posterior to the L5 vertebral  body.  There is some impingement on the left L5 nerve root.   An L5-S1 is in diffuse broad-based bulge but knows the lateral recess  more on left side than the right side.   Overall poor quality study because of movement artifact.   IMPRESSION:  1. Right L4 radiculitis causing right lower extremity pain primarily      with walking which would be typical of a stenotic picture.  I do      not think she has any significant hip pathology to explain some of      the hip findings and he certainly has primarily symptoms that do      correlate with her MRI findings.  2. His left spastic hemiplegia due to cerebrovascular accident  left      foot drop, appears motor stroke.   PLAN:  Given that she has failed medications, this has been going on for  a couple of months and thus far, physical therapy has not been  particularly helpful, we will go ahead and set her up for a right L4  transforaminal.  I will have her go to the [facility]  location for Physical Therapy rather than [facility] given that she has  a comorbidity of left CVA.  There are more neurologic treaters at the  [facility] location and they can address new problem in the context  of her older issues.   Thank you very much for this referral.  I will keep you apprised of her  situation.  We will give her Valium 5 mg prior to the injection.  She  will continue her aspirin 81 mg through the injection.      Erick Colace, M.D.  Electronically Signed     AEK/MedQ  D:   06/11/2008 15:39:53  T:  06/12/2008 05:14:25  Job #:  [id number]   cc:   Sanda Linger, MD  [address] [place] [address] [place] [address]

## 2010-09-06 NOTE — Assessment & Plan Note (Signed)
[facility]                         GASTROENTEROLOGY OFFICE NOTE   RHEA, KAELIN                       MRN:          [id number]  DATE:12/18/2006                            DOB:          11-10-1940    GI PROBLEM LIST:  1. Routine risk for colorectal cancer.  Last colonoscopy October 2007.      Scheduled for a repeat colonoscopy October 2017.  2. Postprandial abdominal pains, early satiety.  CBC, complete      metabolic profile July 2008, normal.  CT scan suggested thickening      of her distal stomach, but was otherwise essentially unrevealing.      She does have disk disease at L5-S1.  EGD August 2005 was normal.   INTERVAL HISTORY:  I last saw Volanda at the time of her upper endoscopy.  Since then, she has continued to have postprandial pain.  Sometimes,  right after she finishes a meal.  Sometimes it is a couple hours later.  She feels like she is eating like a bird, although her weight has been  stable over the past several months.   CURRENT MEDICATIONS:  Diltiazem.  Bee pollen.  Multivitamins.  Aspirin.  Benazepril.  Calcium.  OTC Prilosec.   PHYSICAL EXAM:  Weight 178 pounds, blood pressure 126/74, pulse 64.  CONSTITUTIONAL:  Generally well-appearing.  ABDOMEN:  Soft, nontender, nondistended.  Normal bowel sounds.   ASSESSMENT AND PLAN:  A 70 year old woman with postprandial abdominal  pain, fairly negative workup with EGD, CT scan, CBC, complete metabolic  profile.   Perhaps these are biliary symptoms.  I will arrange for her to have an  abdominal ultrasound performed looking for gall stones.  If that is  negative, I will have her get a HIDA scan with CCK challenge to estimate  ejection fraction.     Rachael Fee, MD  Electronically Signed    DPJ/MedQ  DD: 12/18/2006  DT: 12/18/2006  Job #: [id number]   cc:   Dr. Allayne Butcher

## 2010-09-06 NOTE — Assessment & Plan Note (Signed)
[facility]                         GASTROENTEROLOGY OFFICE NOTE   [name], Tonya                       MRN:          [id number]  DATE:11/14/2006                            DOB:          12-Jul-1940    PRIMARY CARE PHYSICIAN:  Allayne Butcher, PA   GI PROBLEM LIST:  1. Routine risk for colorectal cancer, last colonoscopy, October 2007.      Scheduled for repeat colonoscopy in October 2017.   INTERVAL HISTORY:  I last saw Tonya [name] several months ago.  Actually prior  to then, she has been having some lower abdominal discomfort that she  says are postprandial, but it is usually 2-3 hours after a meal.  It is  just about anything she eats, if she eats a lot of it.  She describes it  is a gripey feeling, lower in her abdomen, usually happens in the  afternoon.  She has no nausea or vomiting.  Her bowels have been moving  regularly.  It is not clear that moving her bowels makes any difference  was this discomfort.  She has had no diarrhea, no constipation.   CURRENT MEDICATIONS:  Diltiazem, bee pollen, multivitamin, B12, calcium,  dicyclomine, Levbid, amlodipine, aspirin, and benazepril.   PHYSICAL EXAM:  Weight 177 pounds which is 8 pounds less than she was  last November.  Blood pressure 116/60, pulse 60.  HEENT: EOMI. PERRLA. Sclerae are anicteric.  Conjunctivae are pink.  NECK:  Supple without thyromegaly, adenopathy or carotid bruits.  CHEST:  Clear to auscultation and percussion without adventitious  sounds.  CARDIAC:  Regular rhythm; normal S1 S2.  There are no murmurs, gallops  or rubs.  ABDOMEN:  Soft, nontender, nondistended, normal bowel sounds.   ASSESSMENT AND PLAN:  A 70 year old woman with somewhat postprandial  lower abdominal discomfort and gradual weight loss.  Her symptoms are  not really irritable bowel-like said she has no alteration in her bowel  habits.  They are somewhat postprandial, but this is 2-3 hours after a  meal; and it  is not clear whether that is really the trigger.  She will  keep a food diary for the next month.  We will also get a basic set of  labs including a complete metabolic profile, CBC, thyroid testing; and I  will arrange for her to have a CT scan of her abdomen and pelvis to see  if we can figure out what does this.  She will return to see me in four  weeks, and sooner if needed.     Rachael Fee, MD  Electronically Signed    DPJ/MedQ  DD: 11/14/2006  DT: 11/15/2006  Job #: [id number]   cc:   Allayne Butcher, [place]

## 2010-09-06 NOTE — Procedures (Signed)
Tonya [name], Tonya [name]                ACCOUNT NO.:  [id number]   MEDICAL RECORD NO.:  [id number]           PATIENT TYPE:   LOCATION:                                 FACILITY:   PHYSICIAN:  Erick Colace, M.D.DATE OF BIRTH:  [birth year]   DATE OF PROCEDURE:  12/14/2008  DATE OF DISCHARGE:                               OPERATIVE REPORT   A left tibialis anterior and tibialis posterior Botox injection.   INDICATION:  Spasticity limiting ambulation left lower extremity with  foot drag.  She has no improvement with p.o. meds or with tibial nerve  block.   No contraindication of Botox.  She has signed and reviewed the REMS for  Botox.   Informed consent was obtained after describing risks and benefits of the  procedure with the patient.  These include bleeding, bruising, and  infection.  She elects to proceed and has given written consent.  The  patient placed in a supine position.  Two areas of tibialis anterior and  two areas on the medial aspect of the left leg just posterior to the  tibia marked and prepped with Betadine, alcohol, entered with a 26-gauge  2-inch needle electrode under needle EMG guidance.  Appropriate EMG  activity was obtained after negative drawback for blood.  One half mL of  Botox solution was injected at each of two sites in the TA and two sites  in the posterior tibialis.  The patient tolerated the procedure well.  Dilution of 50 units/mL.      Erick Colace, M.D.  Electronically Signed     AEK/MEDQ  D:  12/14/2008 15:20:32  T:  12/15/2008 16:10:96  Job:  [id number]

## 2010-09-06 NOTE — Procedures (Signed)
NAME:  Tonya [name], Tonya [name]                ACCOUNT NO.:  [id number]   MEDICAL RECORD NO.:  [id number]           PATIENT TYPE:   LOCATION:                                 FACILITY:   PHYSICIAN:  Erick Colace, M.D.DATE OF BIRTH:  10-02-1940   DATE OF PROCEDURE:  08/04/2008  DATE OF DISCHARGE:                               OPERATIVE REPORT   This is a left tibial nerve block with 0.25% Marcaine.   INDICATION:  Spastic hemiparesis due to CVA with equinovarus spasticity  affecting gait, only partial response to physical therapy and  medications.   Informed consent was obtained after describing risks and benefits of the  procedure with the patient.  These include bleeding, bruising, and  infection.  She elects to proceed and has given written consent.  The  patient placed prone on exam table.  External DC stem applied by EMG  stimulator.  Plantar flexion twitch obtained.  Area marked and prepped  with Betadine.  Entered with a 22-gauge 40-mm needle electrode connected  to peripheral nerve stimulator, plantar flexion twitch obtained,  confirmed at 0.7 mA followed by injection of 5 mL of 0.25% Marcaine  after negative drawback for blood.  The patient tolerated the procedure  well.  Post procedure instructions given.      Erick Colace, M.D.  Electronically Signed     AEK/MEDQ  D:  08/04/2008 16:15:20  T:  08/05/2008 05:53:42  Job:  [id number]

## 2010-09-06 NOTE — Assessment & Plan Note (Signed)
Tonya [name] follows up today.  She underwent a right L4-5 transforaminal  lumbar epidural steroid injection under fluoroscopic guidance dated  June 18, 2008.  Her pain basically is 0 in the right lower extremity  since that time and she is quite pleased.  She has had no new medical  problems in the interval time.  Her Oswestry disability index score is  0.   Her pain score is 0.   She uses a cane to ambulate.   REVIEW OF SYSTEMS:  Positive for bladder control problems, but this is  chronic as well as some abdominal pain, but this is also not new.   PAST HISTORY:  CVA causing left hemiparesis and hypertension.   SOCIAL HISTORY:  Widow lives with her son.  Her daughter is with her  today.   PHYSICAL EXAMINATION:  VITAL SIGNS:  Her blood pressure is 131/76, pulse  62, respirations 18, O2 sat 100% on room air.  EXTREMITIES:  The right lower extremity has negative straight leg  raising test.  She has normal strength in the hip flexor, knee extensor,  or ankle dorsiflexor.  Normal range of motion.  Her back has no  tenderness to palpation.  She has hyperreflexic left lower extremity  with ambulation.  She has inversion of the foot and some toe drag.   IMPRESSION:  1. Right L4 radiculitis improved after epidural steroid injection,      thus far 5-week response as I discussed the patient impossible to      predict how long it will last her.  It were going for about 3      months in terms of average.  We can repeat if needed.  2. Left lower extremity spasticity due to cerebrovascular accident.      She may improve in terms of ambulation if we do a tibial nerve      block.  We will first do 1 with Marcaine and if this is successful      temporarily to the phenol.   I will go ahead and give her some information on this and if she decides  to do this, we will set her up prior to next visit.      Erick Colace, M.D.  Electronically Signed     AEK/MedQ  D:  07/23/2008  13:01:02  T:  07/24/2008 01:05:13  Job #:  [id number]

## 2010-09-06 NOTE — Procedures (Signed)
NAME:  Tonya [name], Tonya [name]                ACCOUNT NO.:  [id number]   MEDICAL RECORD NO.:  [id number]          PATIENT TYPE:  REC   LOCATION:  [facility]                          FACILITY:  [facility]   PHYSICIAN:  Erick Colace, M.D.DATE OF BIRTH:  01/06/1941   DATE OF PROCEDURE:  06/18/2008  DATE OF DISCHARGE:                               OPERATIVE REPORT   PROCEDURE:  Right L4-L5 transforaminal lumbar epidural steroid  injection.   INDICATIONS:  Right L4 radiculitis with pain only partially responsive  to conservative care and interfering with self-care mobility.   Informed consent was obtained after describing risks and benefits of the  procedure to the patient.  These include bleeding, bruising, infection,  loss of bowel or bladder function, temporary or permanent paralysis.  She elects to proceed and has given written consent.  The patient placed  prone on fluoroscopy table.  Betadine prep, sterile drape, 25-gauge 1-  1/2-inch needle was used to anesthetize the skin and subcutaneous tissue  with 1% lidocaine x2 mL.  Then, a 22-gauge 3-1/2-inch spinal needle was  inserted under fluoroscopic guidance.  Starting the L4-L5 intervertebral  foramen, AP, lateral, and oblique images utilized.  Omnipaque 180 under  live fluoro demonstrated no intravascular uptake.  Then, 1 mL of  Omnipaque 180 was injected showing good epidural as well as nerve root  flow in a retroneural approach.  Followed by injection of 1 mL of 1% MPF  lidocaine and 1 mL of 10 mg/mL dexamethasone, the patient tolerated the  procedure well.  Pre- and post-injection vitals stable.  Post-injection  instructions given.      Erick Colace, M.D.  Electronically Signed     AEK/MEDQ  D:  06/18/2008 14:29:37  T:  06/19/2008 02:12:50  Job:  [id number]

## 2010-09-09 NOTE — Discharge Summary (Signed)
[facility]  Patient:    Tonya [name], Tonya [name]                         MRN: [id number] Adm. Date:  [id number] Disc. Date: 02/28/00 Attending:  Herold Harms Dictator:   Mcarthur Rossetti. Angiulli, P.A.                           Discharge Summary  DISCHARGE DIAGNOSES: 1. Right cerebrovascular accident. 2. Hypertension.  HISTORY OF PRESENT ILLNESS:  A 70 year old female admitted October 22 with left-sided weakness and slurred speech, no chest pain or shortness of breath, no nausea or vomiting.  Upon evaluation cranial CT scan was negative.  MRI with nonhemorrhagic infarction of the right aspect of the pons.  MRA negative. Carotid duplex without internal carotid artery stenosis.  Echocardiogram with ejection fraction 55-65%, normal left ventricular function.  Neurology consult, Dr. Thad Ranger.  Placed on intravenous heparin, aspirin, and Plavix. Swallow study:  Maintained on a soft diet.  Her intravenous heparin was discontinued October 24.  She was moderate assist for ambulation.  Latest chemistry was unremarkable.  Admitted for comprehensive rehab program.  PAST MEDICAL HISTORY:  Negative except for hysterectomy.  She does not have a primary M.D.  SOCIAL HISTORY:  No alcohol or tobacco.  Lives with husband in [place]. Independent prior to admission.  One-level home, two steps to entry.  Husband and family to assist as needed on discharge.  CURRENT MEDICATIONS:  She was on no medications prior to admission.  HOSPITAL COURSE:  The patient did well while on rehabilitation services with therapies initiated on a b.i.d. basis.  The following issues are followed during patients rehab course.  Pertaining to Mrs. Moores right cerebrovascular accident, remains stable.  Left upper extremity continued to have slow progress distally.  She had a left AFO brace for the left lower extremity.  She continued on her Plavix and aspirin therapy.  She was supervision for her  ambulation.  Her diet had been advanced to regular and tolerating this quite nicely.  She had no bowel or bladder disturbances.  All family teaching was completed, and she would be discharged to home.  All day passes went well.  During the hospital course she had some blood pressure elevation.  She had been placed on hydrochlorothiazide while on the acute side of the hospital of 25 mg daily.  Her potassium was monitored while on diuretic.  Latest potassium level 3.9.  She was on subcutaneous Lovenox for DVT prophylaxis.  This was discontinued at time of discharge.  LABORATORY AND X-RAY DATA:  Latest labs showed a sodium 136, potassium 3.9, BUN 14, creatinine 0.7, hemoglobin 11.8, hematocrit 35.1.  DISCHARGE MEDICATIONS: 1. Ecotrin 325 mg q.d. 2. Plavix 75 mg q.d. 3. Hydrochlorothiazide 25 mg q.d. 4. Tylenol p.r.n.  DISCHARGE INSTRUCTIONS:  Activity:  As tolerated.  Diet:  Regular.  SPECIAL DISCHARGE INSTRUCTIONS:  No driving.  Advisement for follow-up for a primary M.D. to monitor the patients medical hypertension. DD:  02/27/00 TD:  02/27/00 Job: [id number]

## 2010-09-09 NOTE — Assessment & Plan Note (Signed)
[facility]                           GASTROENTEROLOGY OFFICE NOTE   HOLLYN, STUCKY                       MRN:          [id number]  DATE:01/16/2006                            DOB:          16-May-1940    REASON FOR REFERRAL:  Allayne Butcher asked me to evaluate Ms. Stafford in  consultation regarding a screening colonoscopy, she is at elevated risk for  complications given that she is on Plavix.   HISTORY OF PRESENT ILLNESS:  Tonya [name] is a very pleasant 70 year old woman,  who has never had problems with her bowels, she moves regularly without  bleeding, no constipation or diarrhea.  She had never had colonoscopy.  Her  cousin was recently diagnosed with colon cancer, and that made her a bit  nervous, and so she talked to her primary care physician and was sent over  here for evaluation.  She does take Plavix on a daily basis for a stroke  that she had 5 years ago, which left her with some mild left-sided  hemiparesis.   She had recent lab testing done showing a normal CBC and a normal complete  metabolic profile.   REVIEW OF SYSTEMS:  Notable for stable weight, otherwise essentially normal,  and is available on her nursing intake sheet.   PAST MEDICAL HISTORY:  1. Hypertension.  2. Stroke 5 years ago in 2001, with left-sided hemiparesis.  3. Status post hysterectomy.   CURRENT MEDICATIONS:  1. Plavix 75 mg once daily.  2. Benicar.  3. Diltiazem.  4. Bee pollen.  5. Multivitamin.  6. B12.  7. Calcium.   ALLERGIES:  No known drug allergies.   SOCIAL HISTORY:  Married with 5 children, nonsmoker, nondrinker.   FAMILY HISTORY:  Cousin with colon cancer, but no first-degree relatives  with colon cancer or colon polyps that she is aware of.   PHYSICAL EXAMINATION:  Height 6 feet 0 inches, 187 pounds, blood pressure  142/70, pulse 80.  CONSTITUTIONAL:  Generally well-appearing.  NEUROLOGIC:  Alert and oriented x3.  EYES:  Extraocular  movements intact.  MOUTH:  Oropharynx moist, no lesions.  NECK:  Supple, no lymphadenopathy.  CARDIOVASCULAR:  Heart regular rate and rhythm.  LUNGS:  Clear to auscultation bilaterally.  ABDOMEN:  Soft, nontender, nondistended, normal bowel sounds.  EXTREMITIES:  No lower extremity edema.  SKIN:  No rashes or lesions on visible extremities.   ASSESSMENT AND PLAN:  A 70 year old woman at routine risk for colon cancer,  elevated risk for complications given the fact that she is on Plavix.   I spoke with her primary caregiver, Allayne Butcher, [place], and we both agree that  it is reasonable to hold her Plavix for 7 days prior to performing a  screening colonoscopy.  The fact that her cousin has colon cancer does not  actually elevate her to a higher risk status, so if that colonoscopy is  normal she would not require another colonoscopy for 10 years' time.  I  discussed with her the risks of holding her Plavix, including the small risk  of stoke, versus the risk of  keeping it on, which would be a small risk of  increased bleeding.  Discussed that, perforation, missing a cancer, and she  understands and wishes to proceed with the colonoscopy.  We will therefore  arrange for it to be done at her soonest convenience.                                   Rachael Fee, MD   DPJ/MedQ  DD:  01/16/2006  DT:  01/18/2006  Job #:  [id number]   cc:   Allayne Butcher, [place]

## 2010-09-09 NOTE — Assessment & Plan Note (Signed)
[facility]                           GASTROENTEROLOGY OFFICE NOTE   PORSHE, FLEAGLE                       MRN:          [id number]  DATE:03/06/2006                            DOB:          June 23, 1940    PRIMARY CARE Willys Salvino:  Allayne Butcher.   GASTROINTESTINAL PROBLEMS:  Routine risk for colon cancer, last colonoscopy  October 2007, scheduled for repeat in October 2017.   INTERVAL HISTORY:  I last saw Roselene at the time of her colonoscopy.  At  that time following the procedure, she mentioned some abdominal discomfort  she had been having, so we arranged for her to have this office visit.  She  now tells me that abdominal discomfort was very fleeting, and she has not  had them at least 2-3 weeks.  It was a mild abdominal discomfort that seemed  to just improve on its own.  She had no fever, chills, nausea or vomiting.   CURRENT MEDICATIONS:  Plavix, Benicar, Diltiazem, bee pollen, multivitamins,  calcium.   PHYSICAL EXAMINATION:  VITAL SIGNS: Weight 185 pounds, blood pressure  130/70, pulse 60.  CONSTITUTIONAL:  Generally well appearing.  ABDOMEN:  Soft, nontender, nondistended.  Normal bowel sounds.   ASSESSMENT AND PLAN:  A 70 year old woman at routine risk for colon cancer.   Starkisha will return for colonoscopy in 10 years and sooner if there any  symptoms dictate it.  She knows to get in touch if there are any other  issues she needs to discuss.     Rachael Fee, MD  Electronically Signed    DPJ/MedQ  DD: 03/06/2006  DT: 03/06/2006  Job #: [id number]   cc:   Allayne Butcher

## 2010-09-09 NOTE — Assessment & Plan Note (Signed)
Ms. Tallman follows up today.  She had a botulinum toxin injection  performed on December 14, 2008, 50 units in left tibialis anterior to 50  units in left tibialis posterior.  She has not noted a big improvement  in her symptomatology of inversion of the foot.  She has had no post-  injection complications.  She has had no other new medical problems in  the interval time.  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   SOCIAL HISTORY:  Widowed, lives with her son.   PHYSICAL EXAMINATION:  VITAL SIGNS:  Blood pressure 147/70, pulse 48,  respirations 18, and O2 sat 98% on room air.  GENERAL:  A well-developed, well-nourished female in no acute distress.  Orientation x3.  Affect alert.  MUSCULOSKELETAL:  Movement is with a limp using a cane.  She does have  some inversion at the foot, can stand on the side of her foot but does  not turn her ankle.  She has no evidence of clonus at the ankle.  She  has no dysesthetic pain.   IMPRESSION:  Spasticity, left lower extremity.  She has mainly  inversion.  We will try Botox at a higher dose but also add on  gastrocsoleus complex injection.   The patient agrees with plan.      Erick Colace, M.D.  Electronically Signed     AEK/MedQ  D:  01/11/2009 12:23:23  T:  01/12/2009 04:37:03  Job #:  [id number]

## 2010-09-09 NOTE — H&P (Signed)
[facility]  Patient:    Tonya [name], Tonya [name]                         MRN: [id number] Adm. Date:  [id number] Attending:  Fenton Malling Dictator:   Doneta Public, MS IV                         History and Physical  CHIEF COMPLAINT:  Left-sided weakness.  HISTORY OF PRESENT ILLNESS:  The patient is a 70 year old right-handed African-American female with no primary M.D.  Saturday morning (approximately two days ago), the patient noted symptoms of right eye burning, liken to sensation of hot water in the eye, and some mild left-sided weakness as well as some staggering.  She slept the entire afternoon, with resolution of the symptoms that evening.  On Sunday morning, she awoke and dressed herself and drove to church without difficulty.  No weakness was noted at that time; however, during church, she complained of feeling very badly and somewhat dizzy.  In addition, she noted some slurred speech as well as the return of some left-sided weakness.  Approximately two hours later, the patient was barely able to move her left side.  She lay in bed all afternoon but was unable to sleep.  She did not complain of pain or any paresthesias.  Per the patients daughter, around 6 p.m. Sunday evening, the patient was able to grip with her left hand and move the left lower extremity, although less than on the right side.  The patient did not want to come to the ER at that time.  No other symptoms were noted at that time other than being chilly and some left-sided shivering.  The family went to bed and around 3 a.m. this morning (the day of admission), the patient awoke and needed to go to the bathroom. She was unable to move her entire left side at that time.  She came into the ER for further evaluation.  REVIEW OF SYSTEMS:  Positive for slurred speech which began yesterday during church around noon and has progressively gotten worse since that time.  No diplopia.  Some mild  dysphagia noted after drinking some orange juice.  No headaches.  Decreased appetite yesterday.  No other URI symptoms.  No chest pain, shortness of breath, palpitations, nausea, vomiting, diarrhea, constipation or dysuria.  PAST MEDICAL HISTORY:  Negative.  The patient has no primary care Kiarra Kidd. She has no known previous history of MI, CVA, hypertension or diabetes.  PAST SURGICAL HISTORY:  Positive for a hysterectomy for cervical cancer approximately 39 years ago.  FAMILY HISTORY:  Positive for CVAs in an aunt and uncle.  There is diabetes in two aunts.  The patients mother had hypertension.  The patients father died of an MI.  No other history of bleeding dyscrasias.  SOCIAL HISTORY:  Significant for five years of tobacco abuse, approximately one pack every three to four days; however, the patient has not smoked in 40 years.  No alcohol.  No illicit drugs.  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  She is married and has five children.  MEDICATIONS:  None.  ALLERGIES:  No known drug allergies.  PHYSICAL EXAMINATION  VITAL SIGNS:  Pulse 51, blood pressure 142/87, respirations 16.  Patient is afebrile.  GENERAL:  The patient is asleep but easily arousable.  She is alert and oriented x 3.  CV:  Bradycardic.  Loud S2; otherwise, no murmurs, gallops or rubs.  LUNGS:  Clear to auscultation bilaterally.  ABDOMEN:  Soft and nontender and nondistended, with good bowel sounds.  NEUROLOGIC:  The patient is alert and oriented.  She has slightly slurred speech.  Exam of the cranial nerves reveals a slight left facial droop, diminished shoulder shrug on the left and a questionable slight tongue deviation to the left.  Motor exam reveals 5/5 strength on right upper and lower extremity.  On left upper extremity, the patient is unable to move the hand or the arm; strength is a 0/5 in the upper extremity.  On left lower extremity, the patient has 2 to 3-/5 in hip  flexion.  She is unable to plantarflex or dorsiflex the foot.  Sensation is intact bilaterally in the upper and lower extremities to soft touch, pinprick and proprioception.  Deep tendon reflexes are 1+ and symmetric throughout.  She has downgoing toes bilaterally with a brisker withdrawal response on the right than on the left. Cerebellar exam reveals normal finger-to-nose, normal heel-to-shin and normal rapid alternating movements on the right; the left side was unable to be tested secondary to weakness.  The gait was not tested secondary to the weakness.  The mini-mental status exam revealed a 27/30.  LABORATORY DATA AND STUDIES:  EKG revealed sinus bradycardia.  CT scan showed no acute intracranial process or hemorrhage.  The i-STAT revealed a sodium of 145, potassium 4.1, chloride 107, bicarb 30, BUN 8 and a glucose of 134.  PT was 12.4, INR 0.9, PTT was 30.  CBC showed a white count of 3.8, hemoglobin of 12.3, hematocrit 37.3, platelets of 220,000 and MCV at 83.8.  IMPRESSION:  This is a 70 year old African-American female who sustained a right brain stroke, probably subcortical, which has affected her left motor function but left sensation intact.  PLAN:  The plan is to admit to 3000 to a telemetry bed.  Heparin will be administered per pharmacy protocol.  We will obtain an MRI and MRA of the brain as well as a carotid Duplex study and a 2-D echocardiogram.  The patient will receive O2 via nasal cannula, remain flat in bed and we will obtain a swallow study per speech pathology prior to feeding this patient, given the history of a choking episode.  Plan for PT/OT for intensive inpatient rehab, once she is stable from stroke standpoint. DD:  02/13/00 TD:  02/13/00 Job: [id number]

## 2010-09-09 NOTE — Discharge Summary (Signed)
[place]  Patient:    Tonya [name], Tonya [name]                         MRN: [id number] Adm. Date:  [id number] Disc. Date: 02/16/00 Attending:  Herold Harms                           Discharge Summary  DATE OF BIRTH:  1941/01/25  ADMITTING DIAGNOSES: 1. Left hemiparesis, suspect right brain subcortical cerebrovascular accident. 2. Borderline elevated blood pressure.  DISCHARGE DIAGNOSES: 1. Right brain stem stroke. 2. Left hemiparesis secondary to above. 3. Hypertension. 4. Mild hyperglycemia.  CONDITION ON DISCHARGE:  Stable.  DISCHARGE DIET:  Dysphagia 3 with thin liquids.  DISCHARGE ACTIVITIES:  Ad lib per rehab staff.  DISCHARGE MEDICATIONS: 1. Aspirin 325 mg p.o. q.d. 2. Plavix 75 mg p.o. q.d. 3. Hydrochlorothiazide 25 mg p.o. q.d. 4. Reglan. 5. Tylenol. 6. LOC p.r.n.  STUDIES: 1. MRI of the brain performed February 14, 2000, revealing an acute    non-hemorrhagic, moderate-size infarct in the right pons. 2. MRA of the circle of Willis, revealing diffuse intracranial atherosclerosis    in the anterior and posterior circulation with vessel narrowing and    irregularity at several levels, but no large vessel occlusion. 3. Carotid Doppler study done February 14, 2000, revealing no evidence of    significant ICA stenosis. 4. Echocardiogram, performed February 14, 2000, revealing normal LV systolic    function with an EF of 55-65%, mild to moderate LVH and Doppler evidence of    a mild left ventricular outflow tract obstruction.  HOSPITAL COURSE:  Please see H&P for full admission details. Briefly, this is a 70 year old black female with no known chronic medical problems who presented to the emergency room with acute onset of left hemiparesis. She was admitted for presumed stroke and was treated with heparin per stroke protocol. She underwent studies as above. The mechanism of her stroke was felt to be due to intracranial  atherosclerosis. Blood pressures were somewhat elevated throughout the hospitalization in the range of 150-170 systolic and she was, therefore, started on hydrochlorothiazide. She had been noted to have a few elevated glucose levels during the hospitalization. However, she had a hemoglobin getting as high as 134, however, hemoglobin A1C was checked and was negative at 4.8. She was felt not to have diabetes, but was advised to watch out for glucose intolerance, as she had had some elevated sugars. Speech therapy, physical therapy and occupational therapy were consulted and assisted the patients rehabilitation. She was largely cleared from a speech therapy perspective for her dysphagia 3 diet with thin liquids. Rehab was consulted on October 24 and felt she was an excellent rehabilitation candidate. Also, on that day, a combination of antiplatelet therapy with aspirin and Plavix was started, and heparin was discontinued. She tolerated this without difficulty, and was transferred to the rehab unit on February 16, 2000. She will followup as needed with the rehab team, and at discharge will need assignment to a primary care physician for management of her hypertension and health maintenance issues. DD:  02/16/00 TD:  02/16/00 Job: [id number]

## 2010-11-03 ENCOUNTER — Telehealth: Payer: Self-pay

## 2010-11-03 NOTE — Telephone Encounter (Signed)
Patient daughter lmovm requesting samples of exforge 5-160. Currently no samples available, need to call patient and advise.

## 2010-11-04 NOTE — Telephone Encounter (Signed)
LMOM to inform Pt. 

## 2010-11-07 ENCOUNTER — Other Ambulatory Visit: Payer: Self-pay

## 2010-11-07 MED ORDER — AMLODIPINE BESYLATE-VALSARTAN 5-160 MG PO TABS: 1.0000 | ORAL_TABLET | Freq: Every day | ORAL | Status: DC

## 2010-11-07 NOTE — Telephone Encounter (Signed)
Refill BP meds

## 2010-11-07 NOTE — Telephone Encounter (Signed)
Pt requesting Rx for Exforge. States she typically gets samples. Insurance does not cover the brand so she will need the generic. Is it ok to send Rx for generic?

## 2010-11-07 NOTE — Telephone Encounter (Signed)
yes

## 2010-11-16 ENCOUNTER — Encounter: Payer: Self-pay | Admitting: Internal Medicine

## 2010-11-16 ENCOUNTER — Ambulatory Visit (INDEPENDENT_AMBULATORY_CARE_PROVIDER_SITE_OTHER): Payer: Medicare PPO | Admitting: Internal Medicine

## 2010-11-16 DIAGNOSIS — I10 Essential (primary) hypertension: Secondary | ICD-10-CM

## 2010-11-16 DIAGNOSIS — E785 Hyperlipidemia, unspecified: Secondary | ICD-10-CM

## 2010-11-16 DIAGNOSIS — R609 Edema, unspecified: Secondary | ICD-10-CM

## 2010-11-16 NOTE — Assessment & Plan Note (Signed)
BP is well controlled 

## 2010-11-16 NOTE — Progress Notes (Signed)
  Subjective:    Patient ID: Tonya [name], female    DOB: 1941/01/09, 70 y.o.   MRN: [id number]  Hypertension This is a chronic problem. The current episode started more than 1 year ago. The problem has been gradually improving since onset. The problem is controlled. Pertinent negatives include no anxiety, blurred vision, chest pain, headaches, malaise/fatigue, neck pain, orthopnea, palpitations, peripheral edema, PND, shortness of breath or sweats. Past treatments include angiotensin blockers, calcium channel blockers and diuretics. The current treatment provides significant improvement. There are no compliance problems.       Review of Systems  Constitutional: Negative for malaise/fatigue.  HENT: Negative.  Negative for neck pain.   Eyes: Negative.  Negative for blurred vision.  Respiratory: Negative.  Negative for shortness of breath.   Cardiovascular: Negative.  Negative for chest pain, palpitations, orthopnea and PND.  Gastrointestinal: Negative.   Genitourinary: Negative.   Musculoskeletal: Negative.   Neurological: Positive for weakness (unchanged weakness in left arm and left leg). Negative for dizziness, tremors, seizures, syncope, facial asymmetry, speech difficulty, light-headedness, numbness and headaches.  Hematological: Does not bruise/bleed easily.  Psychiatric/Behavioral: Negative.        Objective:   Physical Exam  Constitutional: She is oriented to person, place, and time. She appears well-developed and well-nourished. No distress.  HENT:  Head: Normocephalic and atraumatic.  Right Ear: External ear normal.  Left Ear: External ear normal.  Nose: Nose normal.  Mouth/Throat: Oropharynx is clear and moist. No oropharyngeal exudate.  Eyes: Conjunctivae and EOM are normal. Pupils are equal, round, and reactive to light. Right eye exhibits no discharge. Left eye exhibits no discharge. No scleral icterus.  Neck: Normal range of motion. Neck supple. No JVD present. No  tracheal deviation present. No thyromegaly present.  Cardiovascular: Normal rate, regular rhythm and intact distal pulses.  Exam reveals no gallop and no friction rub.   Murmur heard. Pulmonary/Chest: Effort normal and breath sounds normal. No stridor. No respiratory distress. She has no wheezes. She has no rales. She exhibits no tenderness.  Abdominal: Soft. Bowel sounds are normal. She exhibits no distension. There is no tenderness. There is no rebound and no guarding.  Musculoskeletal: Normal range of motion. She exhibits no edema and no tenderness.  Lymphadenopathy:    She has no cervical adenopathy.  Neurological: She is alert and oriented to person, place, and time.  Skin: Skin is warm and dry. No rash noted. She is not diaphoretic. No erythema. No pallor.  Psychiatric: She has a normal mood and affect. Her behavior is normal. Judgment and thought content normal.          Assessment & Plan:

## 2010-11-16 NOTE — Patient Instructions (Signed)

## 2010-11-16 NOTE — Assessment & Plan Note (Signed)
This has improved.

## 2010-11-16 NOTE — Assessment & Plan Note (Signed)
She is doing well in simcor

## 2011-01-24 ENCOUNTER — Telehealth: Payer: Self-pay | Admitting: *Deleted

## 2011-01-24 NOTE — Telephone Encounter (Signed)
Pt was given samples of exforge yesterday. She says she wants valturna. This is not on med list, what should patient be taking for BP, please advise.

## 2011-01-25 NOTE — Telephone Encounter (Signed)
valturna has been taken off the market, stay on exforge

## 2011-01-25 NOTE — Telephone Encounter (Signed)
Left detailed VM for Stanton Kidney

## 2011-02-01 ENCOUNTER — Telehealth: Payer: Self-pay | Admitting: *Deleted

## 2011-02-01 NOTE — Telephone Encounter (Signed)
Pt left Vm, unsure what she said in message due to unclear connection. Need to call patient to clarify.

## 2011-02-01 NOTE — Telephone Encounter (Signed)
No answer, no VM

## 2011-02-03 NOTE — Telephone Encounter (Signed)
Spoke w/patient. She is having a problem with the brace on her leg. Company needs info from MD. I advised patient to have them fax needed information to our office to review and we will complete if needed.

## 2011-02-22 ENCOUNTER — Telehealth: Payer: Self-pay | Admitting: *Deleted

## 2011-02-22 NOTE — Telephone Encounter (Signed)
Spoke w/patient. She says med supply company faxed over forms for MD to complete last week. Per pt, Md has not returned fax. I gave pt side A fax and reccommended that she have them use that # putting forms ATTN: Dr Yetta Barre.

## 2011-03-08 ENCOUNTER — Encounter: Payer: Self-pay | Admitting: Internal Medicine

## 2011-03-08 ENCOUNTER — Ambulatory Visit (INDEPENDENT_AMBULATORY_CARE_PROVIDER_SITE_OTHER): Payer: Medicare PPO | Admitting: Internal Medicine

## 2011-03-08 DIAGNOSIS — I10 Essential (primary) hypertension: Secondary | ICD-10-CM

## 2011-03-08 DIAGNOSIS — E785 Hyperlipidemia, unspecified: Secondary | ICD-10-CM

## 2011-03-08 NOTE — Assessment & Plan Note (Signed)
Her BP is well controlled, will continue the current regimen of meds

## 2011-03-08 NOTE — Progress Notes (Signed)
Subjective:    Patient ID: Tonya [name], female    DOB: 07/16/1940, 70 y.o.   MRN: [id number]  Hypertension This is a chronic problem. The current episode started more than 1 year ago. The problem has been gradually improving since onset. The problem is controlled. Pertinent negatives include no anxiety, blurred vision, chest pain, headaches, malaise/fatigue, neck pain, orthopnea, palpitations, peripheral edema, PND, shortness of breath or sweats. There are no associated agents to hypertension. Past treatments include calcium channel blockers, angiotensin blockers, diuretics and beta blockers. The current treatment provides significant improvement. There are no compliance problems.  Hypertensive end-organ damage includes CVA. There is no history of chronic renal disease.  Hyperlipidemia This is a chronic problem. The current episode started more than 1 year ago. The problem is controlled. Recent lipid tests were reviewed and are variable. She has no history of chronic renal disease, diabetes, hypothyroidism, liver disease, obesity or nephrotic syndrome. Pertinent negatives include no chest pain, focal sensory loss, focal weakness, leg pain, myalgias or shortness of breath. Current antihyperlipidemic treatment includes statins and nicotinic acid. The current treatment provides moderate improvement of lipids. There are no compliance problems.       Review of Systems  Constitutional: Negative.  Negative for malaise/fatigue.  HENT: Negative.  Negative for neck pain.   Eyes: Negative.  Negative for blurred vision.  Respiratory: Negative.  Negative for shortness of breath.   Cardiovascular: Negative.  Negative for chest pain, palpitations, orthopnea and PND.  Gastrointestinal: Negative.   Genitourinary: Negative.   Musculoskeletal: Negative.  Negative for myalgias.  Skin: Negative.   Neurological: Negative.  Negative for focal weakness and headaches.  Hematological: Negative.     Psychiatric/Behavioral: Negative.        Objective:   Physical Exam  Vitals reviewed. Constitutional: She is oriented to person, place, and time. She appears well-developed and well-nourished. No distress.  HENT:  Head: Normocephalic and atraumatic.  Mouth/Throat: Oropharynx is clear and moist. No oropharyngeal exudate.  Eyes: Conjunctivae are normal. Right eye exhibits no discharge. Left eye exhibits no discharge. No scleral icterus.  Neck: Normal range of motion. Neck supple. No JVD present. No tracheal deviation present. No thyromegaly present.  Cardiovascular: Normal rate and regular rhythm.  Exam reveals no gallop and no friction rub.   Murmur heard. Pulmonary/Chest: Effort normal and breath sounds normal. No stridor. No respiratory distress. She has no wheezes. She has no rales. She exhibits no tenderness.  Abdominal: Soft. Bowel sounds are normal. She exhibits no distension. There is no tenderness. There is no rebound and no guarding.  Musculoskeletal: Normal range of motion. She exhibits no edema and no tenderness.  Lymphadenopathy:    She has no cervical adenopathy.  Neurological: She is oriented to person, place, and time.  Skin: Skin is warm and dry. No rash noted. She is not diaphoretic. No erythema. No pallor.  Psychiatric: She has a normal mood and affect. Her behavior is normal. Judgment and thought content normal.      Lab Results  Component Value Date   WBC 4.5 04/26/2010   HGB 13.0 04/26/2010   HCT 39.2 04/26/2010   PLT 204.0 04/26/2010   GLUCOSE 100* 04/26/2010   CHOL 158 04/26/2010   TRIG 66.0 04/26/2010   HDL 48.50 04/26/2010   LDLCALC 96 04/26/2010   ALT 16 04/26/2010   AST 22 04/26/2010   NA 141 04/26/2010   K 4.3 04/26/2010   CL 103 04/26/2010   CREATININE 0.5 04/26/2010   BUN  14 04/26/2010   CO2 31 04/26/2010   TSH 2.17 04/26/2010      Assessment & Plan:

## 2011-03-08 NOTE — Assessment & Plan Note (Signed)
She is doing well on simcor 

## 2011-03-08 NOTE — Patient Instructions (Signed)

## 2011-05-15 ENCOUNTER — Ambulatory Visit (HOSPITAL_COMMUNITY)
Admission: RE | Admit: 2011-05-15 | Discharge: 2011-05-15 | Disposition: A | Discharge status: Home / Self Care | Payer: Medicare Other | Source: Ambulatory Visit | Attending: Internal Medicine | Admitting: Internal Medicine

## 2011-05-15 ENCOUNTER — Other Ambulatory Visit: Payer: Self-pay | Admitting: Internal Medicine

## 2011-05-15 DIAGNOSIS — Z1231 Encounter for screening mammogram for malignant neoplasm of breast: Secondary | ICD-10-CM | POA: Insufficient documentation

## 2011-05-15 DIAGNOSIS — Z139 Encounter for screening, unspecified: Secondary | ICD-10-CM

## 2011-05-17 LAB — HM MAMMOGRAPHY: HM Mammogram: NORMAL

## 2011-06-01 ENCOUNTER — Other Ambulatory Visit: Payer: Self-pay

## 2011-06-01 MED ORDER — HYDROCHLOROTHIAZIDE 50 MG PO TABS: 50.0000 mg | ORAL_TABLET | Freq: Every day | ORAL | Status: DC

## 2011-07-12 ENCOUNTER — Ambulatory Visit: Payer: Medicare PPO | Admitting: Internal Medicine

## 2011-07-17 ENCOUNTER — Other Ambulatory Visit (INDEPENDENT_AMBULATORY_CARE_PROVIDER_SITE_OTHER): Payer: Medicare Other

## 2011-07-17 ENCOUNTER — Encounter: Payer: Self-pay | Admitting: Internal Medicine

## 2011-07-17 ENCOUNTER — Ambulatory Visit (INDEPENDENT_AMBULATORY_CARE_PROVIDER_SITE_OTHER): Payer: Medicare Other | Admitting: Internal Medicine

## 2011-07-17 VITALS — BP 134/72 | HR 57 | Temp 97.5°F | Resp 14 | Wt 196.0 lb

## 2011-07-17 DIAGNOSIS — I10 Essential (primary) hypertension: Secondary | ICD-10-CM

## 2011-07-17 DIAGNOSIS — R7309 Other abnormal glucose: Secondary | ICD-10-CM

## 2011-07-17 DIAGNOSIS — E785 Hyperlipidemia, unspecified: Secondary | ICD-10-CM

## 2011-07-17 LAB — COMPREHENSIVE METABOLIC PANEL
AST: 18 U/L (ref 0–37)
Albumin: 4 g/dL (ref 3.5–5.2)
Alkaline Phosphatase: 61 U/L (ref 39–117)
BUN: 13 mg/dL (ref 6–23)
Potassium: 3.5 mEq/L (ref 3.5–5.1)
Sodium: 142 mEq/L (ref 135–145)
Total Bilirubin: 0.4 mg/dL (ref 0.3–1.2)

## 2011-07-17 LAB — CBC WITH DIFFERENTIAL/PLATELET
Eosinophils Relative: 2.6 % (ref 0.0–5.0)
MCV: 88.1 fl (ref 78.0–100.0)
Monocytes Absolute: 0.4 10*3/uL (ref 0.1–1.0)
Neutrophils Relative %: 50 % (ref 43.0–77.0)
Platelets: 214 10*3/uL (ref 150.0–400.0)
WBC: 5.3 10*3/uL (ref 4.5–10.5)

## 2011-07-17 LAB — LIPID PANEL
Cholesterol: 163 mg/dL (ref 0–200)
HDL: 46.6 mg/dL (ref >39.00)
LDL Cholesterol: 101 mg/dL — ABNORMAL HIGH (ref 0–99)
VLDL: 15.8 mg/dL (ref 0.0–40.0)

## 2011-07-17 LAB — HEMOGLOBIN A1C: Hgb A1c MFr Bld: 5.6 % (ref 4.6–6.5)

## 2011-07-17 NOTE — Patient Instructions (Signed)

## 2011-07-17 NOTE — Progress Notes (Signed)
Subjective:    Patient ID: Tonya [name], female    DOB: [birth year], 71 y.o.   MRN: [id number]  Hypertension This is a chronic problem. The current episode started more than 1 year ago. The problem has been gradually improving since onset. The problem is controlled. Pertinent negatives include no anxiety, blurred vision, chest pain, headaches, malaise/fatigue, neck pain, orthopnea, palpitations, peripheral edema, PND, shortness of breath or sweats. Past treatments include beta blockers, calcium channel blockers, angiotensin blockers and diuretics. The current treatment provides significant improvement. There are no compliance problems.  Hypertensive end-organ damage includes CVA. There is no history of chronic renal disease.  Hyperlipidemia This is a chronic problem. The current episode started more than 1 year ago. The problem is controlled. Recent lipid tests were reviewed and are variable. She has no history of chronic renal disease, diabetes, hypothyroidism, liver disease, obesity or nephrotic syndrome. Factors aggravating her hyperlipidemia include no known factors. Pertinent negatives include no chest pain, focal sensory loss, focal weakness, leg pain, myalgias or shortness of breath. Current antihyperlipidemic treatment includes statins and nicotinic acid. The current treatment provides moderate improvement of lipids.      Review of Systems  Constitutional: Negative.  Negative for malaise/fatigue.  HENT: Negative.  Negative for neck pain.   Eyes: Negative.  Negative for blurred vision.  Respiratory: Negative for cough, chest tightness, shortness of breath, wheezing and stridor.   Cardiovascular: Negative for chest pain, palpitations, orthopnea, leg swelling and PND.  Gastrointestinal: Negative for nausea, vomiting, abdominal pain, diarrhea, abdominal distention and rectal pain.  Genitourinary: Negative.   Musculoskeletal: Negative for myalgias, back pain, joint swelling, arthralgias and  gait problem.  Skin: Negative for color change, pallor, rash and wound.  Neurological: Negative for dizziness, focal weakness, seizures, speech difficulty and headaches.  Hematological: Negative for adenopathy. Does not bruise/bleed easily.  Psychiatric/Behavioral: Negative.        Objective:   Physical Exam  Vitals reviewed. Constitutional: She is oriented to person, place, and time. She appears well-developed and well-nourished. No distress.  HENT:  Head: Normocephalic and atraumatic.  Eyes: Conjunctivae are normal. Right eye exhibits no discharge. Left eye exhibits no discharge. No scleral icterus.  Neck: Normal range of motion. Neck supple. No JVD present. No tracheal deviation present. No thyromegaly present.  Cardiovascular: Normal rate, regular rhythm and intact distal pulses.  Exam reveals no gallop and no friction rub.   Murmur heard. Pulmonary/Chest: Effort normal and breath sounds normal. No stridor. No respiratory distress. She has no wheezes. She has no rales. She exhibits no tenderness.  Abdominal: Soft. Bowel sounds are normal. She exhibits no distension and no mass. There is no tenderness. There is no rebound and no guarding.  Musculoskeletal: Normal range of motion. She exhibits no edema and no tenderness.  Lymphadenopathy:    She has no cervical adenopathy.  Neurological: She is alert and oriented to person, place, and time.  Skin: Skin is warm and dry. No rash noted. She is not diaphoretic. No erythema. No pallor.  Psychiatric: She has a normal mood and affect. Her behavior is normal. Judgment and thought content normal.      Lab Results  Component Value Date   WBC 5.3 07/17/2011   HGB 13.0 07/17/2011   HCT 40.7 07/17/2011   PLT 214.0 07/17/2011   GLUCOSE 100* 07/17/2011   CHOL 163 07/17/2011   TRIG 79.0 07/17/2011   HDL 46.60 07/17/2011   LDLCALC 101* 07/17/2011   ALT 17 07/17/2011   AST  18 07/17/2011   NA 142 07/17/2011   K 3.5 07/17/2011   CL 105 07/17/2011    CREATININE 0.6 07/17/2011   BUN 13 07/17/2011   CO2 29 07/17/2011   TSH 2.94 07/17/2011   HGBA1C 5.6 07/17/2011      Assessment & Plan:

## 2011-07-18 DIAGNOSIS — R739 Hyperglycemia, unspecified: Secondary | ICD-10-CM | POA: Insufficient documentation

## 2011-07-18 NOTE — Assessment & Plan Note (Signed)
Her BP is well controlled, I will check her lytes and renal function today 

## 2011-07-18 NOTE — Assessment & Plan Note (Signed)
She is doing well on simcor, I will check her labs today

## 2011-07-18 NOTE — Assessment & Plan Note (Signed)
I will check her a1c today to see if she has developed DM II 

## 2011-11-16 ENCOUNTER — Ambulatory Visit (INDEPENDENT_AMBULATORY_CARE_PROVIDER_SITE_OTHER): Payer: Medicare Other | Admitting: Internal Medicine

## 2011-11-16 ENCOUNTER — Encounter: Payer: Self-pay | Admitting: Internal Medicine

## 2011-11-16 VITALS — BP 122/70 | HR 88 | Temp 97.9°F | Resp 16 | Wt 198.0 lb

## 2011-11-16 DIAGNOSIS — E785 Hyperlipidemia, unspecified: Secondary | ICD-10-CM

## 2011-11-16 DIAGNOSIS — I10 Essential (primary) hypertension: Secondary | ICD-10-CM

## 2011-11-16 NOTE — Progress Notes (Signed)
  Subjective:    Patient ID: Tonya [name], female    DOB: 10/26/1940, 71 y.o.   MRN: [id number]  Hypertension This is a chronic problem. The current episode started more than 1 year ago. The problem has been gradually improving since onset. The problem is controlled. Associated symptoms include peripheral edema. Pertinent negatives include no anxiety, blurred vision, chest pain, headaches, malaise/fatigue, neck pain, orthopnea, palpitations, PND, shortness of breath or sweats. Past treatments include diuretics, calcium channel blockers and angiotensin blockers. The current treatment provides significant improvement. Compliance problems include diet and exercise.  Hypertensive end-organ damage includes CVA.      Review of Systems  Constitutional: Negative.  Negative for malaise/fatigue.  HENT: Negative.  Negative for neck pain.   Eyes: Negative.  Negative for blurred vision.  Respiratory: Negative for cough, chest tightness, shortness of breath, wheezing and stridor.   Cardiovascular: Negative for chest pain, palpitations, orthopnea and PND.  Gastrointestinal: Negative for nausea, vomiting, abdominal pain, diarrhea, constipation and blood in stool.  Genitourinary: Negative.   Musculoskeletal: Negative for myalgias, back pain, joint swelling, arthralgias and gait problem.  Skin: Negative for color change, pallor, rash and wound.  Neurological: Negative.  Negative for headaches.  Hematological: Negative for adenopathy. Does not bruise/bleed easily.  Psychiatric/Behavioral: Negative.        Objective:   Physical Exam  Vitals reviewed. Constitutional: She appears well-developed and well-nourished. No distress.  HENT:  Head: Normocephalic and atraumatic.  Mouth/Throat: Oropharynx is clear and moist. No oropharyngeal exudate.  Eyes: Conjunctivae are normal. Right eye exhibits no discharge. Left eye exhibits no discharge. No scleral icterus.  Neck: Normal range of motion. Neck supple. No  JVD present. No tracheal deviation present. No thyromegaly present.  Cardiovascular: Normal rate, regular rhythm and intact distal pulses.  Exam reveals no gallop and no friction rub.   No murmur heard. Pulmonary/Chest: Effort normal and breath sounds normal. No stridor. No respiratory distress. She has no wheezes. She has no rales. She exhibits no tenderness.  Abdominal: Soft. Bowel sounds are normal. She exhibits no distension and no mass. There is no tenderness. There is no rebound and no guarding.  Musculoskeletal: Normal range of motion. She exhibits edema (2+ edema in LLE and trace edema in RLE). She exhibits no tenderness.  Lymphadenopathy:    She has no cervical adenopathy.  Skin: Skin is warm and dry. No rash noted. She is not diaphoretic. No erythema. No pallor.  Psychiatric: She has a normal mood and affect. Her behavior is normal. Judgment and thought content normal.     Lab Results  Component Value Date   WBC 5.3 07/17/2011   HGB 13.0 07/17/2011   HCT 40.7 07/17/2011   PLT 214.0 07/17/2011   GLUCOSE 100* 07/17/2011   CHOL 163 07/17/2011   TRIG 79.0 07/17/2011   HDL 46.60 07/17/2011   LDLCALC 101* 07/17/2011   ALT 17 07/17/2011   AST 18 07/17/2011   NA 142 07/17/2011   K 3.5 07/17/2011   CL 105 07/17/2011   CREATININE 0.6 07/17/2011   BUN 13 07/17/2011   CO2 29 07/17/2011   TSH 2.94 07/17/2011   HGBA1C 5.6 07/17/2011       Assessment & Plan:

## 2011-11-16 NOTE — Assessment & Plan Note (Signed)
She is doing well on Simcor 

## 2011-11-16 NOTE — Patient Instructions (Signed)

## 2011-11-16 NOTE — Assessment & Plan Note (Signed)
Her BP is well controlled 

## 2012-03-28 ENCOUNTER — Encounter: Payer: Self-pay | Admitting: Internal Medicine

## 2012-03-28 ENCOUNTER — Ambulatory Visit (INDEPENDENT_AMBULATORY_CARE_PROVIDER_SITE_OTHER): Payer: Medicare Other | Admitting: Internal Medicine

## 2012-03-28 ENCOUNTER — Other Ambulatory Visit (INDEPENDENT_AMBULATORY_CARE_PROVIDER_SITE_OTHER): Payer: Medicare Other

## 2012-03-28 VITALS — BP 130/70 | HR 72 | Temp 97.9°F | Resp 16 | Wt 201.0 lb

## 2012-03-28 DIAGNOSIS — I10 Essential (primary) hypertension: Secondary | ICD-10-CM

## 2012-03-28 DIAGNOSIS — E785 Hyperlipidemia, unspecified: Secondary | ICD-10-CM

## 2012-03-28 DIAGNOSIS — R7309 Other abnormal glucose: Secondary | ICD-10-CM

## 2012-03-28 LAB — BASIC METABOLIC PANEL
BUN: 12 mg/dL (ref 6–23)
CO2: 31 mEq/L (ref 19–32)
Calcium: 9.7 mg/dL (ref 8.4–10.5)
Creatinine, Ser: 0.8 mg/dL (ref 0.4–1.2)
GFR: 95.71 mL/min (ref >60.00)
Glucose, Bld: 113 mg/dL — ABNORMAL HIGH (ref 70–99)
Sodium: 140 mEq/L (ref 135–145)

## 2012-03-28 MED ORDER — NEBIVOLOL HCL 10 MG PO TABS: 10.0000 mg | ORAL_TABLET | Freq: Every day | ORAL | Status: DC

## 2012-03-28 NOTE — Patient Instructions (Signed)

## 2012-03-28 NOTE — Assessment & Plan Note (Signed)
Her BP is well controlled Today I will check her lytes and renal function 

## 2012-03-28 NOTE — Progress Notes (Signed)
  Subjective:    Patient ID: Tonya [name], female    DOB: 11/27/1940, 71 y.o.   MRN: [id number]  Hypertension This is a chronic problem. The current episode started more than 1 year ago. The problem has been gradually improving since onset. The problem is controlled. Pertinent negatives include no anxiety, blurred vision, chest pain, headaches, malaise/fatigue, neck pain, orthopnea, palpitations, peripheral edema, PND, shortness of breath or sweats. There are no associated agents to hypertension. Past treatments include diuretics, angiotensin blockers and calcium channel blockers. Hypertensive end-organ damage includes CVA.      Review of Systems  Constitutional: Negative.  Negative for malaise/fatigue.  HENT: Negative.  Negative for neck pain.   Eyes: Negative.  Negative for blurred vision.  Respiratory: Negative.  Negative for shortness of breath.   Cardiovascular: Negative.  Negative for chest pain, palpitations, orthopnea and PND.  Gastrointestinal: Negative.   Genitourinary: Negative.   Musculoskeletal: Negative.   Skin: Negative.   Neurological: Negative.  Negative for headaches.  Hematological: Negative.   Psychiatric/Behavioral: Negative.        Objective:   Physical Exam  Vitals reviewed. Constitutional: She appears well-developed and well-nourished. No distress.  HENT:  Head: Normocephalic and atraumatic.  Mouth/Throat: Oropharynx is clear and moist. No oropharyngeal exudate.  Eyes: Conjunctivae normal are normal. Right eye exhibits no discharge. Left eye exhibits no discharge. No scleral icterus.  Neck: Normal range of motion. Neck supple. No JVD present. No tracheal deviation present. No thyromegaly present.  Cardiovascular: Normal rate, regular rhythm, normal heart sounds and intact distal pulses.  Exam reveals no gallop and no friction rub.   No murmur heard. Pulmonary/Chest: Effort normal and breath sounds normal. No stridor. No respiratory distress. She has no  wheezes. She has no rales. She exhibits no tenderness.  Abdominal: Soft. Bowel sounds are normal. She exhibits no distension and no mass. There is no tenderness. There is no rebound and no guarding.  Musculoskeletal: Normal range of motion. She exhibits no edema and no tenderness.  Lymphadenopathy:    She has no cervical adenopathy.  Skin: Skin is warm and dry. No rash noted. She is not diaphoretic. No erythema. No pallor.      Lab Results  Component Value Date   WBC 5.3 07/17/2011   HGB 13.0 07/17/2011   HCT 40.7 07/17/2011   PLT 214.0 07/17/2011   GLUCOSE 100* 07/17/2011   CHOL 163 07/17/2011   TRIG 79.0 07/17/2011   HDL 46.60 07/17/2011   LDLCALC 101* 07/17/2011   ALT 17 07/17/2011   AST 18 07/17/2011   NA 142 07/17/2011   K 3.5 07/17/2011   CL 105 07/17/2011   CREATININE 0.6 07/17/2011   BUN 13 07/17/2011   CO2 29 07/17/2011   TSH 2.94 07/17/2011   HGBA1C 5.6 07/17/2011      Assessment & Plan:

## 2012-03-28 NOTE — Assessment & Plan Note (Signed)
She is doing well on simcor

## 2012-03-28 NOTE — Assessment & Plan Note (Signed)
I will recheck her blood sugar today

## 2012-05-03 ENCOUNTER — Other Ambulatory Visit: Payer: Self-pay | Admitting: Internal Medicine

## 2012-06-04 ENCOUNTER — Telehealth: Payer: Self-pay | Admitting: Internal Medicine

## 2012-06-04 DIAGNOSIS — I10 Essential (primary) hypertension: Secondary | ICD-10-CM

## 2012-06-04 NOTE — Telephone Encounter (Signed)
Pt req refill for Bystolic 10mg  to be send into the drug store. Pt was requesting sample due to the cost of this med but no sample in the office at this time. Please advise.

## 2012-06-05 MED ORDER — NEBIVOLOL HCL 10 MG PO TABS: 10.0000 mg | ORAL_TABLET | Freq: Every day | ORAL | Status: DC

## 2012-06-05 NOTE — Telephone Encounter (Signed)
Please f/u with pt

## 2012-06-05 NOTE — Telephone Encounter (Signed)
done

## 2012-07-04 ENCOUNTER — Other Ambulatory Visit: Payer: Self-pay | Admitting: Internal Medicine

## 2012-07-04 DIAGNOSIS — Z139 Encounter for screening, unspecified: Secondary | ICD-10-CM

## 2012-07-05 ENCOUNTER — Ambulatory Visit (HOSPITAL_COMMUNITY)
Admission: RE | Admit: 2012-07-05 | Discharge: 2012-07-05 | Disposition: A | Discharge status: Home / Self Care | Payer: Medicare Other | Source: Ambulatory Visit | Attending: Internal Medicine | Admitting: Internal Medicine

## 2012-07-05 DIAGNOSIS — Z1231 Encounter for screening mammogram for malignant neoplasm of breast: Secondary | ICD-10-CM | POA: Insufficient documentation

## 2012-07-05 DIAGNOSIS — Z139 Encounter for screening, unspecified: Secondary | ICD-10-CM

## 2012-07-19 ENCOUNTER — Telehealth: Payer: Self-pay | Admitting: Internal Medicine

## 2012-07-19 DIAGNOSIS — I10 Essential (primary) hypertension: Secondary | ICD-10-CM

## 2012-07-19 NOTE — Telephone Encounter (Signed)
Needing samples of Bystolic.  Please call Gavin Pound when ready.

## 2012-07-22 MED ORDER — NEBIVOLOL HCL 10 MG PO TABS: 10.0000 mg | ORAL_TABLET | Freq: Every day | ORAL | Status: DC

## 2012-07-22 NOTE — Telephone Encounter (Signed)
Samples placed upfront// Returned call to pt//[email or web address]

## 2012-07-29 ENCOUNTER — Ambulatory Visit: Payer: Medicare Other | Admitting: Internal Medicine

## 2012-07-30 ENCOUNTER — Encounter: Payer: Self-pay | Admitting: Internal Medicine

## 2012-07-30 ENCOUNTER — Other Ambulatory Visit (INDEPENDENT_AMBULATORY_CARE_PROVIDER_SITE_OTHER): Payer: Medicare Other

## 2012-07-30 ENCOUNTER — Ambulatory Visit (INDEPENDENT_AMBULATORY_CARE_PROVIDER_SITE_OTHER): Payer: Medicare Other | Admitting: Internal Medicine

## 2012-07-30 VITALS — BP 140/72 | HR 71 | Temp 98.7°F | Resp 16 | Wt 199.0 lb

## 2012-07-30 DIAGNOSIS — R011 Cardiac murmur, unspecified: Secondary | ICD-10-CM

## 2012-07-30 DIAGNOSIS — R7309 Other abnormal glucose: Secondary | ICD-10-CM

## 2012-07-30 DIAGNOSIS — E785 Hyperlipidemia, unspecified: Secondary | ICD-10-CM

## 2012-07-30 DIAGNOSIS — I10 Essential (primary) hypertension: Secondary | ICD-10-CM

## 2012-07-30 DIAGNOSIS — I69359 Hemiplegia and hemiparesis following cerebral infarction affecting unspecified side: Secondary | ICD-10-CM

## 2012-07-30 LAB — COMPREHENSIVE METABOLIC PANEL
AST: 21 U/L (ref 0–37)
Alkaline Phosphatase: 55 U/L (ref 39–117)
BUN: 11 mg/dL (ref 6–23)
Creatinine, Ser: 0.7 mg/dL (ref 0.4–1.2)
Total Bilirubin: 0.7 mg/dL (ref 0.3–1.2)

## 2012-07-30 LAB — LIPID PANEL
HDL: 45 mg/dL (ref >39.00)
LDL Cholesterol: 112 mg/dL — ABNORMAL HIGH (ref 0–99)
Total CHOL/HDL Ratio: 4
Triglycerides: 74 mg/dL (ref 0.0–149.0)
VLDL: 14.8 mg/dL (ref 0.0–40.0)

## 2012-07-30 NOTE — Assessment & Plan Note (Signed)
I will check her A1C to see if she has developed DM2 

## 2012-07-30 NOTE — Assessment & Plan Note (Signed)
Her BP is well controlled Today I will check her lytes and renal function 

## 2012-07-30 NOTE — Patient Instructions (Signed)

## 2012-07-30 NOTE — Assessment & Plan Note (Signed)
She is doing well on Simcor FLP today 

## 2012-07-30 NOTE — Assessment & Plan Note (Signed)
Her prior ECHO 2010 showed LAE, I have asked her to get an updated ECHO done to identify the cause of the murmur and to see if there are any complications

## 2012-07-30 NOTE — Progress Notes (Signed)
Subjective:    Patient ID: Tonya [name], female    DOB: 05/19/1940, 72 y.o.   MRN: [id number]  Hypertension This is a chronic problem. The current episode started more than 1 year ago. The problem is unchanged. The problem is controlled. Pertinent negatives include no anxiety, blurred vision, chest pain, headaches, malaise/fatigue, neck pain, orthopnea, palpitations, peripheral edema, PND, shortness of breath or sweats. Past treatments include beta blockers, calcium channel blockers, angiotensin blockers and diuretics. The current treatment provides significant improvement. There are no compliance problems.  Hypertensive end-organ damage includes CVA.      Review of Systems  Constitutional: Negative.  Negative for chills, malaise/fatigue and fatigue.  HENT: Negative.  Negative for neck pain.   Eyes: Negative.  Negative for blurred vision.  Respiratory: Negative.  Negative for apnea, cough, choking, chest tightness, shortness of breath, wheezing and stridor.   Cardiovascular: Negative.  Negative for chest pain, palpitations, orthopnea and PND.  Gastrointestinal: Negative.  Negative for nausea, vomiting, abdominal pain, diarrhea, constipation and blood in stool.  Endocrine: Negative.  Negative for polydipsia, polyphagia and polyuria.  Genitourinary: Negative.   Musculoskeletal: Negative.  Negative for myalgias, back pain, joint swelling, arthralgias and gait problem.  Skin: Negative.  Negative for color change, pallor, rash and wound.  Allergic/Immunologic: Negative.   Neurological: Negative.  Negative for dizziness, syncope, speech difficulty, weakness, light-headedness and headaches.  Hematological: Negative.  Negative for adenopathy. Does not bruise/bleed easily.  Psychiatric/Behavioral: Negative.        Objective:   Physical Exam  Vitals reviewed. Constitutional: She is oriented to person, place, and time. She appears well-developed and well-nourished. No distress.  HENT:  Head:  Normocephalic and atraumatic.  Mouth/Throat: Oropharynx is clear and moist. No oropharyngeal exudate.  Eyes: Conjunctivae are normal. Right eye exhibits no discharge. Left eye exhibits no discharge. No scleral icterus.  Neck: Normal range of motion. Neck supple. No JVD present. No tracheal deviation present. No thyromegaly present.  Cardiovascular: Normal rate, regular rhythm, intact distal pulses and normal pulses.  Exam reveals no gallop, no S3, no S4 and no friction rub.   Murmur heard.  Decrescendo systolic murmur is present with a grade of 2/6   No diastolic murmur is present  Pulmonary/Chest: Effort normal and breath sounds normal. No stridor. No respiratory distress. She has no wheezes. She has no rales. She exhibits no tenderness.  Abdominal: Soft. Bowel sounds are normal. She exhibits no distension and no mass. There is no tenderness. There is no rebound and no guarding.  Musculoskeletal: Normal range of motion. She exhibits no edema and no tenderness.  Lymphadenopathy:    She has no cervical adenopathy.  Neurological: She is oriented to person, place, and time.  Skin: Skin is warm and dry. No rash noted. She is not diaphoretic. No erythema. No pallor.  Psychiatric: She has a normal mood and affect. Her behavior is normal. Judgment and thought content normal.      Lab Results  Component Value Date   WBC 5.3 07/17/2011   HGB 13.0 07/17/2011   HCT 40.7 07/17/2011   PLT 214.0 07/17/2011   GLUCOSE 113* 03/28/2012   CHOL 163 07/17/2011   TRIG 79.0 07/17/2011   HDL 46.60 07/17/2011   LDLCALC 101* 07/17/2011   ALT 17 07/17/2011   AST 18 07/17/2011   NA 140 03/28/2012   K 4.2 03/28/2012   CL 103 03/28/2012   CREATININE 0.8 03/28/2012   BUN 12 03/28/2012   CO2 31 03/28/2012  TSH 2.94 07/17/2011   HGBA1C 5.6 07/17/2011      Assessment & Plan:

## 2012-08-05 ENCOUNTER — Ambulatory Visit (HOSPITAL_COMMUNITY): Payer: Medicare Other | Attending: Internal Medicine | Admitting: Radiology

## 2012-08-05 DIAGNOSIS — R011 Cardiac murmur, unspecified: Secondary | ICD-10-CM

## 2012-08-05 NOTE — Progress Notes (Signed)
Echocardiogram performed.  

## 2012-11-01 ENCOUNTER — Telehealth: Payer: Self-pay | Admitting: *Deleted

## 2012-11-01 NOTE — Telephone Encounter (Signed)
Pt's daughter called requesting refill samples for pts [place], [address], [facility].  Advised her we only have Bystolic on hand.  Offered to send in other medications to pharmacy, she denied.

## 2012-12-06 ENCOUNTER — Telehealth: Payer: Self-pay | Admitting: Internal Medicine

## 2012-12-06 NOTE — Telephone Encounter (Signed)
Patient requesting samples of [place], left up front for her

## 2013-01-01 ENCOUNTER — Ambulatory Visit (INDEPENDENT_AMBULATORY_CARE_PROVIDER_SITE_OTHER): Payer: Medicare Other | Admitting: Internal Medicine

## 2013-01-01 ENCOUNTER — Other Ambulatory Visit (INDEPENDENT_AMBULATORY_CARE_PROVIDER_SITE_OTHER): Payer: Medicare Other

## 2013-01-01 ENCOUNTER — Encounter: Payer: Self-pay | Admitting: Internal Medicine

## 2013-01-01 VITALS — BP 134/82 | HR 49 | Temp 98.2°F | Resp 16 | Wt 198.0 lb

## 2013-01-01 DIAGNOSIS — Z0000 Encounter for general adult medical examination without abnormal findings: Secondary | ICD-10-CM

## 2013-01-01 DIAGNOSIS — R7309 Other abnormal glucose: Secondary | ICD-10-CM

## 2013-01-01 DIAGNOSIS — I69959 Hemiplegia and hemiparesis following unspecified cerebrovascular disease affecting unspecified side: Secondary | ICD-10-CM

## 2013-01-01 DIAGNOSIS — I10 Essential (primary) hypertension: Secondary | ICD-10-CM

## 2013-01-01 DIAGNOSIS — I69359 Hemiplegia and hemiparesis following cerebral infarction affecting unspecified side: Secondary | ICD-10-CM

## 2013-01-01 DIAGNOSIS — E785 Hyperlipidemia, unspecified: Secondary | ICD-10-CM

## 2013-01-01 LAB — COMPREHENSIVE METABOLIC PANEL
ALT: 19 U/L (ref 0–35)
AST: 19 U/L (ref 0–37)
BUN: 14 mg/dL (ref 6–23)
CO2: 30 mEq/L (ref 19–32)
Creatinine, Ser: 0.7 mg/dL (ref 0.4–1.2)
GFR: 114.39 mL/min (ref >60.00)
Total Bilirubin: 0.9 mg/dL (ref 0.3–1.2)

## 2013-01-01 LAB — LIPID PANEL
HDL: 45.1 mg/dL (ref >39.00)
LDL Cholesterol: 112 mg/dL — ABNORMAL HIGH (ref 0–99)
Total CHOL/HDL Ratio: 4
Triglycerides: 77 mg/dL (ref 0.0–149.0)

## 2013-01-01 MED ORDER — ROSUVASTATIN CALCIUM 10 MG PO TABS: 10.0000 mg | ORAL_TABLET | Freq: Every day | ORAL | Status: DC

## 2013-01-01 MED ORDER — AMLODIPINE BESYLATE-VALSARTAN 5-160 MG PO TABS: 1.0000 | ORAL_TABLET | Freq: Every day | ORAL | Status: DC

## 2013-01-01 NOTE — Progress Notes (Signed)
Subjective:    Patient ID: Tonya [name], female    DOB: [birth year], 72 y.o.   MRN: [id number]  Hypertension This is a chronic problem. The current episode started more than 1 year ago. The problem has been gradually improving since onset. The problem is controlled. Pertinent negatives include no anxiety, blurred vision, chest pain, headaches, malaise/fatigue, neck pain, orthopnea, palpitations, peripheral edema, PND, shortness of breath or sweats. Past treatments include beta blockers, calcium channel blockers, diuretics and angiotensin blockers. The current treatment provides moderate improvement. Compliance problems include exercise and diet.  Hypertensive end-organ damage includes CVA.      Review of Systems  Constitutional: Negative.  Negative for malaise/fatigue.  HENT: Negative.  Negative for neck pain.   Eyes: Negative.  Negative for blurred vision.  Respiratory: Negative.  Negative for cough, choking, chest tightness, shortness of breath, wheezing and stridor.   Cardiovascular: Negative.  Negative for chest pain, palpitations, orthopnea, leg swelling and PND.  Gastrointestinal: Negative.  Negative for abdominal pain.  Endocrine: Negative.   Genitourinary: Negative.   Musculoskeletal: Negative.  Negative for myalgias, back pain, joint swelling, arthralgias and gait problem.  Skin: Negative.   Allergic/Immunologic: Negative.   Neurological: Negative.  Negative for dizziness, tremors, facial asymmetry, weakness, light-headedness, numbness and headaches.  Hematological: Negative.  Negative for adenopathy. Does not bruise/bleed easily.  Psychiatric/Behavioral: Negative.        Objective:   Physical Exam  Vitals reviewed. Constitutional: She is oriented to person, place, and time. She appears well-developed and well-nourished. No distress.  HENT:  Head: Normocephalic and atraumatic.  Mouth/Throat: Oropharynx is clear and moist. No oropharyngeal exudate.  Eyes: Conjunctivae are  normal. Right eye exhibits no discharge. Left eye exhibits no discharge. No scleral icterus.  Neck: Normal range of motion. Neck supple. No JVD present. No tracheal deviation present. No thyromegaly present.  Cardiovascular: Normal rate, regular rhythm, normal heart sounds and intact distal pulses.  Exam reveals no gallop and no friction rub.   No murmur heard. Pulmonary/Chest: Effort normal and breath sounds normal. No stridor. No respiratory distress. She has no wheezes. She has no rales. She exhibits no tenderness.  Abdominal: Soft. Bowel sounds are normal. She exhibits no distension and no mass. There is no tenderness. There is no rebound and no guarding.  Musculoskeletal: Normal range of motion. She exhibits edema (1+ edema in LLE and tr edema in RLE). She exhibits no tenderness.  Lymphadenopathy:    She has no cervical adenopathy.  Neurological: She is alert and oriented to person, place, and time. She displays abnormal reflex. No cranial nerve deficit. She exhibits abnormal muscle tone. Coordination abnormal.  Skin: Skin is warm and dry. No rash noted. She is not diaphoretic. No erythema. No pallor.  Psychiatric: She has a normal mood and affect. Her behavior is normal. Judgment and thought content normal.     Lab Results  Component Value Date   WBC 5.3 07/17/2011   HGB 13.0 07/17/2011   HCT 40.7 07/17/2011   PLT 214.0 07/17/2011   GLUCOSE 137* 07/30/2012   CHOL 172 07/30/2012   TRIG 74.0 07/30/2012   HDL 45.00 07/30/2012   LDLCALC 112* 07/30/2012   ALT 19 07/30/2012   AST 21 07/30/2012   NA 141 07/30/2012   K 4.3 07/30/2012   CL 104 07/30/2012   CREATININE 0.7 07/30/2012   BUN 11 07/30/2012   CO2 30 07/30/2012   TSH 2.48 07/30/2012   HGBA1C 5.9 07/30/2012  Assessment & Plan:

## 2013-01-01 NOTE — Patient Instructions (Addendum)

## 2013-01-02 ENCOUNTER — Encounter: Payer: Self-pay | Admitting: Internal Medicine

## 2013-01-02 DIAGNOSIS — Z0000 Encounter for general adult medical examination without abnormal findings: Secondary | ICD-10-CM | POA: Insufficient documentation

## 2013-01-02 NOTE — Assessment & Plan Note (Signed)
She has pre-diabetes 

## 2013-01-02 NOTE — Assessment & Plan Note (Signed)
She ran out is Simcor I have asked her to switch to crestor

## 2013-01-02 NOTE — Assessment & Plan Note (Addendum)

## 2013-01-02 NOTE — Assessment & Plan Note (Signed)
Her BP is well controlled Will check her lytes and renal function today 

## 2013-01-02 NOTE — Assessment & Plan Note (Signed)
This is stable, no changes noted

## 2013-01-16 ENCOUNTER — Telehealth: Payer: Self-pay | Admitting: *Deleted

## 2013-01-16 MED ORDER — NIACIN-SIMVASTATIN ER 500-40 MG PO TB24: 40.0000 mg | ORAL_TABLET | Freq: Every day | ORAL | Status: DC

## 2013-01-16 NOTE — Telephone Encounter (Signed)
Pt called states the Crestor is causing headaches.  Pt is requesting to go back to Crestor which had no adverse effects.  Please advise in Dr Yetta Barre absence

## 2013-01-16 NOTE — Telephone Encounter (Signed)
Do you mean resume simvastatin? Ok to resume prior simva 40mg  until further discussion with Dr Yetta Barre

## 2013-01-16 NOTE — Telephone Encounter (Signed)
Called pt to verify.  Simcor did not had adverse side effects.  Pt is requesting to go back to Simcor.

## 2013-02-26 ENCOUNTER — Telehealth: Payer: Self-pay | Admitting: *Deleted

## 2013-02-26 DIAGNOSIS — E785 Hyperlipidemia, unspecified: Secondary | ICD-10-CM

## 2013-02-26 MED ORDER — NIACIN-SIMVASTATIN ER 500-40 MG PO TB24
1.0000 | ORAL_TABLET | Freq: Every day | ORAL | Status: DC
Start: 2013-02-26 — End: 2015-06-11

## 2013-02-26 NOTE — Telephone Encounter (Signed)
Pt is requesting refill of Simcor, states Crestor causes headache.  Please advise

## 2013-04-29 ENCOUNTER — Telehealth: Payer: Self-pay | Admitting: Internal Medicine

## 2013-04-29 DIAGNOSIS — I10 Essential (primary) hypertension: Secondary | ICD-10-CM

## 2013-04-29 MED ORDER — NEBIVOLOL HCL 10 MG PO TABS: 10.0000 mg | ORAL_TABLET | Freq: Every day | ORAL | Status: DC

## 2013-04-29 NOTE — Telephone Encounter (Signed)
Pt request sample for Bystolic and Amlodipine-Valsartan. Please call pt

## 2013-04-29 NOTE — Telephone Encounter (Signed)
Pt provided samples of Bystolic 10 mg but no exforge available. Per pt, exforge will cost $135 monthly and would like to know if maybe cheaper to have two separate scripts for amlodipine and valsartan. Please advise, if ok will need rx sent to pharmacy

## 2013-05-09 ENCOUNTER — Telehealth: Payer: Self-pay | Admitting: Internal Medicine

## 2013-05-09 DIAGNOSIS — I10 Essential (primary) hypertension: Secondary | ICD-10-CM

## 2013-05-09 NOTE — Telephone Encounter (Signed)
Patient called stating she left a message last week to see if  maybe cheaper to have two separate scripts for amlodipine and valsartan.  Please advise

## 2013-05-11 MED ORDER — AMLODIPINE BESYLATE 5 MG PO TABS: 5.0000 mg | ORAL_TABLET | Freq: Every day | ORAL | Status: DC

## 2013-05-11 MED ORDER — VALSARTAN 160 MG PO TABS: 160.0000 mg | ORAL_TABLET | Freq: Every day | ORAL | Status: DC

## 2013-05-30 ENCOUNTER — Telehealth: Payer: Self-pay | Admitting: *Deleted

## 2013-05-30 ENCOUNTER — Other Ambulatory Visit: Payer: Self-pay

## 2013-05-30 MED ORDER — HYDROCHLOROTHIAZIDE 50 MG PO TABS: ORAL_TABLET | ORAL | Status: DC

## 2013-05-30 NOTE — Telephone Encounter (Signed)
Patient phoned inquiring about new bp/chole meds.  Advised patient that norvasc and diovan were ordered and sent to pharmacy of record 05/11/13 and receipt confirmed by pharmacy at 1600 on the same day.  Verbalized understanding & appreciation.

## 2013-05-30 NOTE — Telephone Encounter (Signed)
Patient phoned upset b/c the pharmacy had the ex[facility] delivered to her, rather than the two meds separately as written and ordered and receipt confirmation by pharmacy dated 05/11/13 at 1659.  Phoned pharmacy and spoke with pharmacist, Dorothea Ogle, who states he will correct and make right with patient.

## 2013-07-02 ENCOUNTER — Encounter: Payer: Self-pay | Admitting: Internal Medicine

## 2013-07-02 ENCOUNTER — Ambulatory Visit (INDEPENDENT_AMBULATORY_CARE_PROVIDER_SITE_OTHER): Payer: Self-pay | Admitting: Internal Medicine

## 2013-07-02 ENCOUNTER — Ambulatory Visit (INDEPENDENT_AMBULATORY_CARE_PROVIDER_SITE_OTHER)
Admission: RE | Admit: 2013-07-02 | Discharge: 2013-07-02 | Disposition: A | Discharge status: Home / Self Care | Payer: Self-pay | Source: Ambulatory Visit | Attending: Internal Medicine | Admitting: Internal Medicine

## 2013-07-02 VITALS — BP 120/70 | HR 61 | Temp 100.0°F | Resp 16 | Ht 71.5 in | Wt 194.4 lb

## 2013-07-02 DIAGNOSIS — R059 Cough, unspecified: Secondary | ICD-10-CM | POA: Insufficient documentation

## 2013-07-02 DIAGNOSIS — R05 Cough: Secondary | ICD-10-CM

## 2013-07-02 DIAGNOSIS — K219 Gastro-esophageal reflux disease without esophagitis: Secondary | ICD-10-CM

## 2013-07-02 DIAGNOSIS — Z0000 Encounter for general adult medical examination without abnormal findings: Secondary | ICD-10-CM

## 2013-07-02 DIAGNOSIS — R6889 Other general symptoms and signs: Secondary | ICD-10-CM | POA: Insufficient documentation

## 2013-07-02 DIAGNOSIS — I10 Essential (primary) hypertension: Secondary | ICD-10-CM

## 2013-07-02 MED ORDER — PROMETHAZINE-DM 6.25-15 MG/5ML PO SYRP: 5.0000 mL | ORAL_SOLUTION | Freq: Four times a day (QID) | ORAL | Status: DC | PRN

## 2013-07-02 MED ORDER — RANITIDINE HCL 300 MG PO TABS: 300.0000 mg | ORAL_TABLET | Freq: Every day | ORAL | Status: DC

## 2013-07-02 NOTE — Assessment & Plan Note (Signed)
Her CXR is normal.

## 2013-07-02 NOTE — Assessment & Plan Note (Signed)
I have asked her to start treating this with zantac

## 2013-07-02 NOTE — Progress Notes (Signed)
Pre visit review using our clinic review tool, if applicable. No additional management support is needed unless otherwise documented below in the visit note. 

## 2013-07-02 NOTE — Assessment & Plan Note (Signed)
This is viral She has presented too late to benefit from tamiflu I will treat the symptoms with phenergan-dm

## 2013-07-02 NOTE — Progress Notes (Signed)
Subjective:    Patient ID: Tonya [name], female    DOB: 1940/11/27, 73 y.o.   MRN: [id number]  Cough This is a new problem. The current episode started in the past 7 days. The problem has been gradually improving. The problem occurs every few hours. The cough is non-productive. Associated symptoms include nasal congestion, postnasal drip and rhinorrhea. Pertinent negatives include no chest pain, chills, ear congestion, ear pain, fever, headaches, heartburn, hemoptysis, myalgias, rash, sore throat, shortness of breath, sweats, weight loss or wheezing. Nothing aggravates the symptoms. She has tried OTC cough suppressant for the symptoms. The treatment provided moderate relief. Her past medical history is significant for COPD. There is no history of asthma, bronchiectasis, bronchitis, emphysema, environmental allergies or pneumonia.      Review of Systems  Constitutional: Negative.  Negative for fever, chills, weight loss, diaphoresis, appetite change and fatigue.  HENT: Positive for postnasal drip and rhinorrhea. Negative for congestion, ear pain, sinus pressure, sore throat, trouble swallowing and voice change.   Eyes: Negative.   Respiratory: Positive for cough. Negative for apnea, hemoptysis, choking, chest tightness, shortness of breath, wheezing and stridor.   Cardiovascular: Negative.  Negative for chest pain, palpitations and leg swelling.  Gastrointestinal: Negative.  Negative for heartburn, nausea, vomiting, abdominal pain, diarrhea, constipation, blood in stool and abdominal distention.       +heartburn and indigestion  Endocrine: Negative.   Genitourinary: Negative.   Musculoskeletal: Negative.  Negative for arthralgias, back pain and myalgias.  Skin: Negative.  Negative for rash.  Allergic/Immunologic: Negative.  Negative for environmental allergies.  Neurological: Negative.  Negative for dizziness, tremors, weakness, light-headedness and headaches.  Hematological: Negative.   Negative for adenopathy. Does not bruise/bleed easily.  Psychiatric/Behavioral: Negative.        Objective:   Physical Exam  Vitals reviewed. Constitutional: She is oriented to person, place, and time. She appears well-developed and well-nourished.  Non-toxic appearance. She does not have a sickly appearance. She does not appear ill. No distress.  HENT:  Head: Normocephalic and atraumatic.  Mouth/Throat: Oropharynx is clear and moist. No oropharyngeal exudate.  Eyes: Conjunctivae are normal. Right eye exhibits no discharge. Left eye exhibits no discharge. No scleral icterus.  Neck: Normal range of motion. Neck supple. No JVD present. No tracheal deviation present. No thyromegaly present.  Cardiovascular: Normal rate, regular rhythm, S1 normal, S2 normal and intact distal pulses.  Exam reveals no gallop and no friction rub.   Murmur heard.  Decrescendo systolic murmur is present with a grade of 1/6   No diastolic murmur is present  Pulses:      Carotid pulses are 1+ on the right side, and 1+ on the left side.      Radial pulses are 1+ on the right side, and 1+ on the left side.       Femoral pulses are 1+ on the right side, and 1+ on the left side.      Popliteal pulses are 1+ on the right side, and 1+ on the left side.       Dorsalis pedis pulses are 1+ on the right side, and 1+ on the left side.       Posterior tibial pulses are 1+ on the right side, and 1+ on the left side.  Pulmonary/Chest: Effort normal and breath sounds normal. No stridor. No respiratory distress. She has no wheezes. She has no rales. She exhibits no tenderness.  Abdominal: Soft. Bowel sounds are normal. She exhibits no distension  and no mass. There is no tenderness. There is no rebound and no guarding.  Musculoskeletal: Normal range of motion. She exhibits no edema and no tenderness.  Lymphadenopathy:    She has no cervical adenopathy.  Neurological: She is oriented to person, place, and time.  Skin: Skin is warm  and dry. No rash noted. She is not diaphoretic. No erythema. No pallor.     Lab Results  Component Value Date   WBC 5.3 07/17/2011   HGB 13.0 07/17/2011   HCT 40.7 07/17/2011   PLT 214.0 07/17/2011   GLUCOSE 121* 01/01/2013   CHOL 172 01/01/2013   TRIG 77.0 01/01/2013   HDL 45.10 01/01/2013   LDLCALC 112* 01/01/2013   ALT 19 01/01/2013   AST 19 01/01/2013   NA 140 01/01/2013   K 3.8 01/01/2013   CL 103 01/01/2013   CREATININE 0.7 01/01/2013   BUN 14 01/01/2013   CO2 30 01/01/2013   TSH 2.48 07/30/2012   HGBA1C 6.1 01/01/2013       Assessment & Plan:

## 2013-07-02 NOTE — Patient Instructions (Signed)
Acute Bronchitis Bronchitis is inflammation of the airways that extend from the windpipe into the lungs (bronchi). The inflammation often causes mucus to develop. This leads to a cough, which is the most common symptom of bronchitis.  In acute bronchitis, the condition usually develops suddenly and goes away over time, usually in a couple weeks. Smoking, allergies, and asthma can make bronchitis worse. Repeated episodes of bronchitis may cause further lung problems.  CAUSES Acute bronchitis is most often caused by the same virus that causes a cold. The virus can spread from person to person (contagious).  SIGNS AND SYMPTOMS   Cough.   Fever.   Coughing up mucus.   Body aches.   Chest congestion.   Chills.   Shortness of breath.   Sore throat.  DIAGNOSIS  Acute bronchitis is usually diagnosed through a physical exam. Tests, such as chest X-rays, are sometimes done to rule out other conditions.  TREATMENT  Acute bronchitis usually goes away in a couple weeks. Often times, no medical treatment is necessary. Medicines are sometimes given for relief of fever or cough. Antibiotics are usually not needed but may be prescribed in certain situations. In some cases, an inhaler may be recommended to help reduce shortness of breath and control the cough. A cool mist vaporizer may also be used to help thin bronchial secretions and make it easier to clear the chest.  HOME CARE INSTRUCTIONS  Get plenty of rest.   Drink enough fluids to keep your urine clear or pale yellow (unless you have a medical condition that requires fluid restriction). Increasing fluids may help thin your secretions and will prevent dehydration.   Only take over-the-counter or prescription medicines as directed by your health care provider.   Avoid smoking and secondhand smoke. Exposure to cigarette smoke or irritating chemicals will make bronchitis worse. If you are a smoker, consider using nicotine gum or skin  patches to help control withdrawal symptoms. Quitting smoking will help your lungs heal faster.   Reduce the chances of another bout of acute bronchitis by washing your hands frequently, avoiding people with cold symptoms, and trying not to touch your hands to your mouth, nose, or eyes.   Follow up with your health care provider as directed.  SEEK MEDICAL CARE IF: Your symptoms do not improve after 1 week of treatment.  SEEK IMMEDIATE MEDICAL CARE IF:  You develop an increased fever or chills.   You have chest pain.   You have severe shortness of breath.  You have bloody sputum.   You develop dehydration.  You develop fainting.  You develop repeated vomiting.  You develop a severe headache. MAKE SURE YOU:   Understand these instructions.  Will watch your condition.  Will get help right away if you are not doing well or get worse. Document Released: 05/18/2004 Document Revised: 12/11/2012 Document Reviewed: 10/01/2012 Exit[employer] Patient Information 2014 [employer].  

## 2013-07-02 NOTE — Assessment & Plan Note (Signed)
Her BP is well controlled 

## 2013-07-03 ENCOUNTER — Telehealth: Payer: Self-pay | Admitting: *Deleted

## 2013-07-03 NOTE — Telephone Encounter (Signed)
Hilda Blades phoned stating that the cough syrup prescribed yesterday was not enough to last for patient's cough (only 2 day's worth dispensed) and she's already out of the med. Per MAR, 118 ml were ordered.  Phoned pharmacy and spoke with Scott, RPh, 118 ml were dispensed.  Please advise.  CB# for [name] [phone]

## 2013-07-03 NOTE — Telephone Encounter (Signed)
Something is not right

## 2013-07-04 NOTE — Telephone Encounter (Signed)
Daughter had son (patient's son that lives with patient).  They checked the bottle and there's only less than half a bottle remaining.  Is there a possible way of ordering refill if present script runs out?  (dtr was mistaken yesterday with amount remaining in bottle).  CB# for daughter, [name] [phone]

## 2013-07-04 NOTE — Telephone Encounter (Signed)
Phoned patient's daughter, [name] and relayed info discovered from pharmacy and was shocked. She had thought that maybe the pharmacy hadn't completely filled the order amount or either the ordered amount was not enough.  She is calling her brother now (that lives with patient).

## 2013-07-30 ENCOUNTER — Encounter: Payer: Self-pay | Admitting: Internal Medicine

## 2013-07-30 ENCOUNTER — Other Ambulatory Visit (INDEPENDENT_AMBULATORY_CARE_PROVIDER_SITE_OTHER): Payer: Commercial Managed Care - HMO

## 2013-07-30 ENCOUNTER — Ambulatory Visit (INDEPENDENT_AMBULATORY_CARE_PROVIDER_SITE_OTHER): Payer: Commercial Managed Care - HMO | Admitting: Internal Medicine

## 2013-07-30 VITALS — BP 124/82 | HR 63 | Temp 97.5°F | Resp 16 | Ht 71.5 in | Wt 195.0 lb

## 2013-07-30 DIAGNOSIS — E785 Hyperlipidemia, unspecified: Secondary | ICD-10-CM

## 2013-07-30 DIAGNOSIS — I10 Essential (primary) hypertension: Secondary | ICD-10-CM

## 2013-07-30 DIAGNOSIS — R7309 Other abnormal glucose: Secondary | ICD-10-CM

## 2013-07-30 DIAGNOSIS — K219 Gastro-esophageal reflux disease without esophagitis: Secondary | ICD-10-CM

## 2013-07-30 LAB — COMPREHENSIVE METABOLIC PANEL
ALBUMIN: 3.9 g/dL (ref 3.5–5.2)
ALK PHOS: 58 U/L (ref 39–117)
ALT: 17 U/L (ref 0–35)
AST: 22 U/L (ref 0–37)
BUN: 8 mg/dL (ref 6–23)
CHLORIDE: 104 meq/L (ref 96–112)
CO2: 29 mEq/L (ref 19–32)
Calcium: 9.6 mg/dL (ref 8.4–10.5)
Creatinine, Ser: 0.7 mg/dL (ref 0.4–1.2)
GFR: 104.85 mL/min (ref >60.00)
GLUCOSE: 132 mg/dL — AB (ref 70–99)
POTASSIUM: 4.1 meq/L (ref 3.5–5.1)
SODIUM: 140 meq/L (ref 135–145)
TOTAL PROTEIN: 7 g/dL (ref 6.0–8.3)
Total Bilirubin: 0.7 mg/dL (ref 0.3–1.2)

## 2013-07-30 LAB — LIPID PANEL
Cholesterol: 164 mg/dL (ref 0–200)
HDL: 48.4 mg/dL (ref >39.00)
LDL CALC: 108 mg/dL — AB (ref 0–99)
Total CHOL/HDL Ratio: 3
Triglycerides: 40 mg/dL (ref 0.0–149.0)
VLDL: 8 mg/dL (ref 0.0–40.0)

## 2013-07-30 LAB — HEMOGLOBIN A1C: Hgb A1c MFr Bld: 6 % (ref 4.6–6.5)

## 2013-07-30 LAB — TSH: TSH: 2.93 u[IU]/mL (ref 0.35–5.50)

## 2013-07-30 NOTE — Assessment & Plan Note (Signed)
Her BP is well controlled Today I will check her lytes and renal fucntion

## 2013-07-30 NOTE — Progress Notes (Signed)
Pre visit review using our clinic review tool, if applicable. No additional management support is needed unless otherwise documented below in the visit note. 

## 2013-07-30 NOTE — Progress Notes (Signed)
Subjective:    Patient ID: Tonya [name], female    DOB: 1941-04-13, 73 y.o.   MRN: [id number]  Hypertension This is a chronic problem. The current episode started more than 1 year ago. The problem is unchanged. The problem is uncontrolled. Pertinent negatives include no anxiety, blurred vision, chest pain, headaches, malaise/fatigue, neck pain, orthopnea, palpitations, peripheral edema, PND, shortness of breath or sweats. There are no associated agents to hypertension. Past treatments include calcium channel blockers, beta blockers, angiotensin blockers and diuretics. The current treatment provides significant improvement. Compliance problems include diet and exercise.  Hypertensive end-organ damage includes CVA.      Review of Systems  Constitutional: Negative for fever, chills, malaise/fatigue, diaphoresis, appetite change and fatigue.  HENT: Negative.   Eyes: Negative.  Negative for blurred vision.  Respiratory: Negative.  Negative for cough, choking, chest tightness, shortness of breath and stridor.   Cardiovascular: Negative.  Negative for chest pain, palpitations, orthopnea, leg swelling and PND.  Gastrointestinal: Negative.  Negative for abdominal pain.  Endocrine: Negative.   Genitourinary: Negative.   Musculoskeletal: Negative.  Negative for neck pain.  Allergic/Immunologic: Negative.   Neurological: Negative.  Negative for dizziness, tremors, light-headedness and headaches.       She has no new neuro complaints  Hematological: Negative.  Negative for adenopathy. Does not bruise/bleed easily.  Psychiatric/Behavioral: Negative.        Objective:   Physical Exam  Constitutional: She is oriented to person, place, and time. She appears well-developed and well-nourished. No distress.  HENT:  Head: Normocephalic and atraumatic.  Mouth/Throat: Oropharynx is clear and moist. No oropharyngeal exudate.  Eyes: Conjunctivae are normal. Right eye exhibits no discharge. Left eye  exhibits no discharge. No scleral icterus.  Neck: Normal range of motion. Neck supple. No JVD present. No tracheal deviation present. No thyromegaly present.  Cardiovascular: Normal rate, regular rhythm, S1 normal, S2 normal and intact distal pulses.  Exam reveals no gallop and no friction rub.   Murmur heard.  Decrescendo systolic murmur is present with a grade of 1/6   No diastolic murmur is present  Pulses:      Carotid pulses are 1+ on the right side, and 1+ on the left side.      Radial pulses are 1+ on the right side, and 1+ on the left side.       Femoral pulses are 1+ on the right side, and 1+ on the left side.      Popliteal pulses are 1+ on the right side, and 1+ on the left side.       Dorsalis pedis pulses are 1+ on the right side, and 1+ on the left side.       Posterior tibial pulses are 1+ on the right side, and 1+ on the left side.  Pulmonary/Chest: Effort normal and breath sounds normal. No stridor. No respiratory distress. She has no wheezes. She has no rales. She exhibits no tenderness.  Abdominal: Soft. Bowel sounds are normal. She exhibits no distension and no mass. There is no tenderness. There is no rebound and no guarding.  Musculoskeletal: Normal range of motion. She exhibits no edema and no tenderness.  Lymphadenopathy:    She has no cervical adenopathy.  Neurological: She is oriented to person, place, and time.  Skin: Skin is warm and dry. No rash noted. She is not diaphoretic. No erythema. No pallor.  Psychiatric: She has a normal mood and affect. Her behavior is normal. Judgment and thought content  normal.     Lab Results  Component Value Date   WBC 5.3 07/17/2011   HGB 13.0 07/17/2011   HCT 40.7 07/17/2011   PLT 214.0 07/17/2011   GLUCOSE 121* 01/01/2013   CHOL 172 01/01/2013   TRIG 77.0 01/01/2013   HDL 45.10 01/01/2013   LDLCALC 112* 01/01/2013   ALT 19 01/01/2013   AST 19 01/01/2013   NA 140 01/01/2013   K 3.8 01/01/2013   CL 103 01/01/2013   CREATININE 0.7  01/01/2013   BUN 14 01/01/2013   CO2 30 01/01/2013   TSH 2.48 07/30/2012   HGBA1C 6.1 01/01/2013       Assessment & Plan:

## 2013-07-30 NOTE — Assessment & Plan Note (Signed)
She is doing well on Simcor FLP today

## 2013-07-30 NOTE — Assessment & Plan Note (Signed)
I will check her A1C to see if she has developed DM2 

## 2013-07-30 NOTE — Patient Instructions (Signed)

## 2013-07-30 NOTE — Assessment & Plan Note (Signed)
Improvement noted 

## 2013-08-11 ENCOUNTER — Other Ambulatory Visit: Payer: Self-pay | Admitting: Internal Medicine

## 2013-08-11 DIAGNOSIS — Z1231 Encounter for screening mammogram for malignant neoplasm of breast: Secondary | ICD-10-CM

## 2013-08-12 ENCOUNTER — Ambulatory Visit (HOSPITAL_COMMUNITY)
Admission: RE | Admit: 2013-08-12 | Discharge: 2013-08-12 | Disposition: A | Discharge status: Home / Self Care | Payer: Medicare HMO | Source: Ambulatory Visit | Attending: Internal Medicine | Admitting: Internal Medicine

## 2013-08-12 DIAGNOSIS — Z1231 Encounter for screening mammogram for malignant neoplasm of breast: Secondary | ICD-10-CM

## 2013-08-13 LAB — HM MAMMOGRAPHY: HM Mammogram: NORMAL

## 2013-10-28 ENCOUNTER — Ambulatory Visit (INDEPENDENT_AMBULATORY_CARE_PROVIDER_SITE_OTHER)
Admission: RE | Admit: 2013-10-28 | Discharge: 2013-10-28 | Disposition: A | Discharge status: Home / Self Care | Payer: Commercial Managed Care - HMO | Source: Ambulatory Visit | Attending: Internal Medicine | Admitting: Internal Medicine

## 2013-10-28 ENCOUNTER — Telehealth: Payer: Self-pay | Admitting: Internal Medicine

## 2013-10-28 ENCOUNTER — Encounter: Payer: Self-pay | Admitting: Internal Medicine

## 2013-10-28 ENCOUNTER — Ambulatory Visit (INDEPENDENT_AMBULATORY_CARE_PROVIDER_SITE_OTHER): Payer: Commercial Managed Care - HMO | Admitting: Internal Medicine

## 2013-10-28 VITALS — BP 122/70 | HR 72 | Temp 98.5°F | Resp 16 | Wt 192.0 lb

## 2013-10-28 DIAGNOSIS — E785 Hyperlipidemia, unspecified: Secondary | ICD-10-CM

## 2013-10-28 DIAGNOSIS — J4489 Other specified chronic obstructive pulmonary disease: Secondary | ICD-10-CM

## 2013-10-28 DIAGNOSIS — J449 Chronic obstructive pulmonary disease, unspecified: Secondary | ICD-10-CM

## 2013-10-28 DIAGNOSIS — I69959 Hemiplegia and hemiparesis following unspecified cerebrovascular disease affecting unspecified side: Secondary | ICD-10-CM

## 2013-10-28 DIAGNOSIS — N318 Other neuromuscular dysfunction of bladder: Secondary | ICD-10-CM

## 2013-10-28 DIAGNOSIS — M79609 Pain in unspecified limb: Secondary | ICD-10-CM

## 2013-10-28 DIAGNOSIS — M79671 Pain in right foot: Secondary | ICD-10-CM

## 2013-10-28 DIAGNOSIS — IMO0002 Reserved for concepts with insufficient information to code with codable children: Secondary | ICD-10-CM

## 2013-10-28 DIAGNOSIS — I69359 Hemiplegia and hemiparesis following cerebral infarction affecting unspecified side: Secondary | ICD-10-CM

## 2013-10-28 DIAGNOSIS — I10 Essential (primary) hypertension: Secondary | ICD-10-CM

## 2013-10-28 MED ORDER — CHOLESTYRAMINE 4 G PO PACK: 4.0000 g | PACK | Freq: Two times a day (BID) | ORAL | Status: DC

## 2013-10-28 NOTE — Assessment & Plan Note (Signed)
No changes noted today 

## 2013-10-28 NOTE — Assessment & Plan Note (Signed)
Her exam is normal, will check a plain film to look for fracture She does not want anything for pain

## 2013-10-28 NOTE — Assessment & Plan Note (Signed)
Will add cholestyramine at her request

## 2013-10-28 NOTE — Progress Notes (Signed)
Subjective:    Patient ID: Tonya [name], female    DOB: [birth year], 73 y.o.   MRN: [id number]  Foot Injury  The incident occurred more than 1 week ago. The incident occurred at home. The injury mechanism was a direct blow. The pain is present in the right foot (a pan fell on her right great toe). The quality of the pain is described as aching and burning. The pain is at a severity of 2/10. The pain is mild. The pain has been improving since onset. Pertinent negatives include no inability to bear weight, loss of motion, loss of sensation, muscle weakness or tingling. It is unknown if a foreign body is present. The symptoms are aggravated by movement, palpation and weight bearing. She has tried nothing for the symptoms. The treatment provided significant relief.      Review of Systems  Constitutional: Negative.   HENT: Negative.   Eyes: Negative.   Respiratory: Negative.   Cardiovascular: Negative.   Gastrointestinal: Negative.  Negative for abdominal pain.  Endocrine: Negative.   Genitourinary: Negative.   Musculoskeletal: Positive for arthralgias. Negative for back pain, gait problem, myalgias, neck pain and neck stiffness.  Skin: Negative.  Negative for pallor.  Allergic/Immunologic: Negative.   Neurological: Negative.  Negative for tingling.  Hematological: Negative.  Negative for adenopathy. Does not bruise/bleed easily.  Psychiatric/Behavioral: Negative.        Objective:   Physical Exam  Vitals reviewed. Constitutional: She appears well-developed and well-nourished. No distress.  HENT:  Head: Normocephalic and atraumatic.  Mouth/Throat: Oropharynx is clear and moist. No oropharyngeal exudate.  Eyes: Conjunctivae are normal. Right eye exhibits no discharge. Left eye exhibits no discharge. No scleral icterus.  Neck: Normal range of motion. Neck supple. No JVD present. No tracheal deviation present. No thyromegaly present.  Cardiovascular: Normal rate, regular rhythm and  intact distal pulses.  Exam reveals no gallop and no friction rub.   Murmur heard. Pulmonary/Chest: Effort normal and breath sounds normal. No stridor. No respiratory distress. She has no wheezes. She has no rales. She exhibits no tenderness.  Abdominal: Soft. Bowel sounds are normal. She exhibits no distension and no mass. There is no tenderness. There is no rebound and no guarding.  Musculoskeletal: Normal range of motion. She exhibits edema (1+ edema in LLE). She exhibits no tenderness.       Right foot: Normal. She exhibits normal range of motion, no tenderness, no bony tenderness, no swelling, normal capillary refill, no crepitus, no deformity and no laceration.  Lymphadenopathy:    She has no cervical adenopathy.  Neurological: She is alert. She displays atrophy (LLE). She displays no tremor and normal reflexes. A sensory deficit (LLE) is present. No cranial nerve deficit. She exhibits abnormal muscle tone (LLE). She displays a negative Romberg sign. She displays no seizure activity. Coordination and gait abnormal.  Skin: She is not diaphoretic.     Lab Results  Component Value Date   WBC 5.3 07/17/2011   HGB 13.0 07/17/2011   HCT 40.7 07/17/2011   PLT 214.0 07/17/2011   GLUCOSE 132* 07/30/2013   CHOL 164 07/30/2013   TRIG 40.0 07/30/2013   HDL 48.40 07/30/2013   LDLCALC 108* 07/30/2013   ALT 17 07/30/2013   AST 22 07/30/2013   NA 140 07/30/2013   K 4.1 07/30/2013   CL 104 07/30/2013   CREATININE 0.7 07/30/2013   BUN 8 07/30/2013   CO2 29 07/30/2013   TSH 2.93 07/30/2013   HGBA1C 6.0 07/30/2013  Assessment & Plan:

## 2013-10-28 NOTE — Assessment & Plan Note (Signed)
Her BP is well controlled 

## 2013-10-28 NOTE — Progress Notes (Signed)
Pre visit review using our clinic review tool, if applicable. No additional management support is needed unless otherwise documented below in the visit note. 

## 2013-10-28 NOTE — Patient Instructions (Signed)
Foot Contusion °A foot contusion is a deep bruise to the foot. Contusions are the result of an injury that caused bleeding under the skin. The contusion may turn blue, purple, or yellow. Minor injuries will give you a painless contusion, but more severe contusions may stay painful and swollen for a few weeks. °CAUSES  °A foot contusion comes from a direct blow to that area, such as a heavy object falling on the foot. °SYMPTOMS  °· Swelling of the foot. °· Discoloration of the foot. °· Tenderness or soreness of the foot. °DIAGNOSIS  °You will have a physical exam and will be asked about your history. You may need an X-ray of your foot to look for a broken bone (fracture).  °TREATMENT  °An elastic wrap may be recommended to support your foot. Resting, elevating, and applying cold compresses to your foot are often the best treatments for a foot contusion. Over-the-counter medicines may also be recommended for pain control. °HOME CARE INSTRUCTIONS  °· Put ice on the injured area. °¨ Put ice in a plastic bag. °¨ Place a towel between your skin and the bag. °¨ Leave the ice on for 15-20 minutes, 03-04 times a day. °· Only take over-the-counter or prescription medicines for pain, discomfort, or fever as directed by your caregiver. °· If told, use an elastic wrap as directed. This can help reduce swelling. You may remove the wrap for sleeping, showering, and bathing. If your toes become numb, cold, or blue, take the wrap off and reapply it more loosely. °· Elevate your foot with pillows to reduce swelling. °· Try to avoid standing or walking while the foot is painful. Do not resume use until instructed by your caregiver. Then, begin use gradually. If pain develops, decrease use. Gradually increase activities that do not cause discomfort until you have normal use of your foot. °· See your caregiver as directed. It is very important to keep all follow-up appointments in order to avoid any lasting problems with your foot,  including long-term (chronic) pain. °SEEK IMMEDIATE MEDICAL CARE IF:  °· You have increased redness, swelling, or pain in your foot. °· Your swelling or pain is not relieved with medicines. °· You have loss of feeling in your foot or are unable to move your toes. °· Your foot turns cold or blue. °· You have pain when you move your toes. °· Your foot becomes warm to the touch. °· Your contusion does not improve in 2 days. °MAKE SURE YOU:  °· Understand these instructions. °· Will watch your condition. °· Will get help right away if you are not doing well or get worse. °Document Released: 01/30/2006 Document Revised: 10/10/2011 Document Reviewed: 03/14/2011 °[employer]® Patient Information ©2015 [employer]. This information is not intended to replace advice given to you by your health care provider. Make sure you discuss any questions you have with your health care provider. ° °

## 2013-10-28 NOTE — Telephone Encounter (Signed)
Relevant patient education assigned to patient using Emmi. ° °

## 2013-11-25 ENCOUNTER — Ambulatory Visit: Payer: Commercial Managed Care - HMO | Admitting: Internal Medicine

## 2013-11-25 DIAGNOSIS — Z0289 Encounter for other administrative examinations: Secondary | ICD-10-CM

## 2013-11-26 ENCOUNTER — Telehealth: Payer: Self-pay | Admitting: Internal Medicine

## 2013-11-26 NOTE — Telephone Encounter (Signed)
noted 

## 2013-11-26 NOTE — Telephone Encounter (Signed)
Patient no showed for 4 week fu on 08/4.  Please advise.

## 2014-05-04 ENCOUNTER — Telehealth: Payer: Self-pay | Admitting: Internal Medicine

## 2014-05-04 DIAGNOSIS — I10 Essential (primary) hypertension: Secondary | ICD-10-CM

## 2014-05-04 NOTE — Telephone Encounter (Signed)
Patient daughter requesting sample of nebivolol (BYSTOLIC) 10 MG tablet [[id number]].  Advised that since samples have not been given since July, i would need to verify we have samples to give. Advised we would call and advise. She also stated that there were other samples given, but no notes as to what samples they were.

## 2014-05-05 MED ORDER — NEBIVOLOL HCL 10 MG PO TABS: 10.0000 mg | ORAL_TABLET | Freq: Every day | ORAL | Status: DC

## 2014-05-05 NOTE — Telephone Encounter (Signed)
Returned call to phone number provided, per voice prompt "VM has not been set up". I have placed four boxes of Bystolic upfront for pick up, there are no other samples available. Due to new sample policy, no further samples will be available.

## 2014-05-05 NOTE — Telephone Encounter (Signed)
Tonya [name] daughter called back in about these samples.

## 2014-06-01 ENCOUNTER — Other Ambulatory Visit: Payer: Self-pay | Admitting: Internal Medicine

## 2014-06-25 ENCOUNTER — Telehealth: Payer: Self-pay

## 2014-06-25 DIAGNOSIS — I10 Essential (primary) hypertension: Secondary | ICD-10-CM

## 2014-06-25 MED ORDER — NEBIVOLOL HCL 10 MG PO TABS: 10.0000 mg | ORAL_TABLET | Freq: Every day | ORAL | Status: DC

## 2014-06-25 NOTE — Telephone Encounter (Signed)
Pt lvm on triage and would like to request refill and/or samples of the Bystolic.   Please advise.

## 2014-06-25 NOTE — Telephone Encounter (Signed)
done

## 2014-06-25 NOTE — Telephone Encounter (Signed)
Py vsllr in    Best number [place]

## 2014-06-29 ENCOUNTER — Ambulatory Visit (INDEPENDENT_AMBULATORY_CARE_PROVIDER_SITE_OTHER): Payer: Commercial Managed Care - HMO | Admitting: Internal Medicine

## 2014-06-29 ENCOUNTER — Encounter: Payer: Self-pay | Admitting: Internal Medicine

## 2014-06-29 ENCOUNTER — Other Ambulatory Visit (INDEPENDENT_AMBULATORY_CARE_PROVIDER_SITE_OTHER): Payer: Commercial Managed Care - HMO

## 2014-06-29 VITALS — BP 136/72 | HR 56 | Temp 97.7°F | Resp 16 | Wt 194.0 lb

## 2014-06-29 DIAGNOSIS — E785 Hyperlipidemia, unspecified: Secondary | ICD-10-CM

## 2014-06-29 DIAGNOSIS — N318 Other neuromuscular dysfunction of bladder: Secondary | ICD-10-CM | POA: Diagnosis not present

## 2014-06-29 DIAGNOSIS — I10 Essential (primary) hypertension: Secondary | ICD-10-CM | POA: Diagnosis not present

## 2014-06-29 LAB — LIPID PANEL
Cholesterol: 170 mg/dL (ref 0–200)
HDL: 45.7 mg/dL (ref >39.00)
LDL Cholesterol: 102 mg/dL — ABNORMAL HIGH (ref 0–99)
NonHDL: 124.3
Total CHOL/HDL Ratio: 4
Triglycerides: 113 mg/dL (ref 0.0–149.0)
VLDL: 22.6 mg/dL (ref 0.0–40.0)

## 2014-06-29 LAB — BASIC METABOLIC PANEL
BUN: 11 mg/dL (ref 6–23)
CHLORIDE: 104 meq/L (ref 96–112)
CO2: 31 meq/L (ref 19–32)
CREATININE: 0.72 mg/dL (ref 0.40–1.20)
Calcium: 9.7 mg/dL (ref 8.4–10.5)
GFR: 101.25 mL/min (ref >60.00)
Glucose, Bld: 112 mg/dL — ABNORMAL HIGH (ref 70–99)
POTASSIUM: 3.5 meq/L (ref 3.5–5.1)
Sodium: 140 mEq/L (ref 135–145)

## 2014-06-29 LAB — CBC WITH DIFFERENTIAL/PLATELET
Basophils Absolute: 0 10*3/uL (ref 0.0–0.1)
Basophils Relative: 0.4 % (ref 0.0–3.0)
EOS ABS: 0.1 10*3/uL (ref 0.0–0.7)
Eosinophils Relative: 2.2 % (ref 0.0–5.0)
HCT: 39 % (ref 36.0–46.0)
HEMOGLOBIN: 12.8 g/dL (ref 12.0–15.0)
LYMPHS PCT: 45 % (ref 12.0–46.0)
Lymphs Abs: 2.3 10*3/uL (ref 0.7–4.0)
MCHC: 32.9 g/dL (ref 30.0–36.0)
MCV: 84.9 fl (ref 78.0–100.0)
MONO ABS: 0.5 10*3/uL (ref 0.1–1.0)
MONOS PCT: 8.9 % (ref 3.0–12.0)
NEUTROS ABS: 2.2 10*3/uL (ref 1.4–7.7)
Neutrophils Relative %: 43.5 % (ref 43.0–77.0)
Platelets: 230 10*3/uL (ref 150.0–400.0)
RBC: 4.59 Mil/uL (ref 3.87–5.11)
RDW: 13 % (ref 11.5–15.5)
WBC: 5.1 10*3/uL (ref 4.0–10.5)

## 2014-06-29 LAB — TSH: TSH: 2.34 u[IU]/mL (ref 0.35–4.50)

## 2014-06-29 MED ORDER — HYDROCHLOROTHIAZIDE 25 MG PO TABS: 25.0000 mg | ORAL_TABLET | Freq: Every day | ORAL | Status: DC

## 2014-06-29 MED ORDER — CARVEDILOL 6.25 MG PO TABS: 6.2500 mg | ORAL_TABLET | Freq: Two times a day (BID) | ORAL | Status: DC

## 2014-06-29 MED ORDER — TOLTERODINE TARTRATE ER 4 MG PO CP24: 4.0000 mg | ORAL_CAPSULE | Freq: Every day | ORAL | Status: DC

## 2014-06-29 NOTE — Progress Notes (Signed)
Pre visit review using our clinic review tool, if applicable. No additional management support is needed unless otherwise documented below in the visit note. 

## 2014-06-29 NOTE — Patient Instructions (Signed)

## 2014-07-01 NOTE — Assessment & Plan Note (Signed)
She has achieved her LDL goal and is doing well on the statin

## 2014-07-01 NOTE — Assessment & Plan Note (Signed)
Will lower the HCTZ dose Will try detrol for symptom relief

## 2014-07-01 NOTE — Progress Notes (Signed)
   Subjective:    Patient ID: Tonya [name], female    DOB: 10/20/1940, 74 y.o.   MRN: [id number]  HPI Comments: She complains that the water pill makes her urinate too frequently and that bystolic is too expensive  Hypertension This is a chronic problem. The current episode started more than 1 year ago. Pertinent negatives include no chest pain, palpitations or shortness of breath. There are no associated agents to hypertension. Past treatments include angiotensin blockers, diuretics and beta blockers. The current treatment provides moderate improvement. Compliance problems include medication cost and exercise.  Hypertensive end-organ damage includes CVA.  Hyperlipidemia Pertinent negatives include no chest pain or shortness of breath.      Review of Systems  Constitutional: Negative.  Negative for fever, chills, diaphoresis, appetite change and fatigue.  HENT: Negative.   Eyes: Negative.   Respiratory: Negative.  Negative for cough, choking, chest tightness, shortness of breath and stridor.   Cardiovascular: Negative.  Negative for chest pain, palpitations and leg swelling.  Gastrointestinal: Negative.  Negative for abdominal pain.  Endocrine: Negative.   Genitourinary: Positive for frequency. Negative for dysuria, urgency, hematuria, decreased urine volume and enuresis.  Musculoskeletal: Negative.   Skin: Negative.   Allergic/Immunologic: Negative.   Neurological: Negative.   Hematological: Negative.  Negative for adenopathy. Does not bruise/bleed easily.       Objective:   Physical Exam  Constitutional: She is oriented to person, place, and time. She appears well-developed and well-nourished. No distress.  HENT:  Head: Normocephalic and atraumatic.  Mouth/Throat: Oropharynx is clear and moist. No oropharyngeal exudate.  Eyes: Conjunctivae are normal. Right eye exhibits no discharge. Left eye exhibits no discharge. No scleral icterus.  Neck: Normal range of motion. Neck  supple. No JVD present. No tracheal deviation present. No thyromegaly present.  Cardiovascular: Normal rate, regular rhythm, normal heart sounds and intact distal pulses.  Exam reveals no gallop and no friction rub.   No murmur heard. Pulmonary/Chest: Effort normal and breath sounds normal. No stridor. No respiratory distress. She has no wheezes. She has no rales. She exhibits no tenderness.  Abdominal: Soft. Bowel sounds are normal. She exhibits no distension and no mass. There is no tenderness. There is no rebound and no guarding.  Musculoskeletal: Normal range of motion. She exhibits no edema or tenderness.  Lymphadenopathy:    She has no cervical adenopathy.  Neurological: She is oriented to person, place, and time.  Skin: Skin is warm and dry. No rash noted. She is not diaphoretic. No erythema. No pallor.  Psychiatric: She has a normal mood and affect. Her behavior is normal. Judgment and thought content normal.  Vitals reviewed.   Lab Results  Component Value Date   WBC 5.1 06/29/2014   HGB 12.8 06/29/2014   HCT 39.0 06/29/2014   PLT 230.0 06/29/2014   GLUCOSE 112* 06/29/2014   CHOL 170 06/29/2014   TRIG 113.0 06/29/2014   HDL 45.70 06/29/2014   LDLCALC 102* 06/29/2014   ALT 17 07/30/2013   AST 22 07/30/2013   NA 140 06/29/2014   K 3.5 06/29/2014   CL 104 06/29/2014   CREATININE 0.72 06/29/2014   BUN 11 06/29/2014   CO2 31 06/29/2014   TSH 2.34 06/29/2014   HGBA1C 6.0 07/30/2013        Assessment & Plan:

## 2014-07-01 NOTE — Assessment & Plan Note (Signed)
Will lower the dose on HCTZ due to her complaint of urinary frequency Will change bystolic to coreg for lower cost Her BP is adequately well controlled today Lytes and renal function are stable

## 2014-07-08 ENCOUNTER — Encounter: Payer: Commercial Managed Care - HMO | Admitting: Internal Medicine

## 2014-07-09 ENCOUNTER — Encounter: Payer: Commercial Managed Care - HMO | Admitting: Internal Medicine

## 2014-07-20 ENCOUNTER — Encounter: Payer: Commercial Managed Care - HMO | Admitting: Internal Medicine

## 2014-07-21 ENCOUNTER — Encounter: Payer: Commercial Managed Care - HMO | Admitting: Internal Medicine

## 2014-07-30 ENCOUNTER — Ambulatory Visit: Payer: Commercial Managed Care - HMO | Admitting: Internal Medicine

## 2014-07-30 ENCOUNTER — Encounter: Payer: Commercial Managed Care - HMO | Admitting: Internal Medicine

## 2014-07-30 ENCOUNTER — Ambulatory Visit: Payer: Commercial Managed Care - HMO

## 2014-08-06 ENCOUNTER — Ambulatory Visit (INDEPENDENT_AMBULATORY_CARE_PROVIDER_SITE_OTHER): Payer: Commercial Managed Care - HMO | Admitting: Internal Medicine

## 2014-08-06 ENCOUNTER — Encounter: Payer: Self-pay | Admitting: Internal Medicine

## 2014-08-06 VITALS — BP 144/92 | HR 64 | Temp 97.9°F | Ht 71.5 in | Wt 197.2 lb

## 2014-08-06 DIAGNOSIS — R739 Hyperglycemia, unspecified: Secondary | ICD-10-CM | POA: Diagnosis not present

## 2014-08-06 DIAGNOSIS — E785 Hyperlipidemia, unspecified: Secondary | ICD-10-CM

## 2014-08-06 DIAGNOSIS — Z0000 Encounter for general adult medical examination without abnormal findings: Secondary | ICD-10-CM | POA: Diagnosis not present

## 2014-08-06 DIAGNOSIS — I10 Essential (primary) hypertension: Secondary | ICD-10-CM

## 2014-08-06 MED ORDER — CARVEDILOL 12.5 MG PO TABS
12.5000 mg | ORAL_TABLET | Freq: Two times a day (BID) | ORAL | Status: DC
Start: 2014-08-06 — End: 2014-10-20

## 2014-08-06 MED ORDER — CYANOCOBALAMIN 2000 MCG PO TABS: 2000.0000 ug | ORAL_TABLET | Freq: Every day | ORAL | Status: DC

## 2014-08-06 MED ORDER — CHOLECALCIFEROL 50 MCG (2000 UT) PO TABS: 1.0000 | ORAL_TABLET | Freq: Every day | ORAL | Status: DC

## 2014-08-06 NOTE — Patient Instructions (Addendum)
Tonya [name] , Thank you for taking time to come for your Medicare Wellness Visit. I appreciate your ongoing commitment to your health goals. Please review the following plan we discussed and let me know if I can assist you in the future.   To discuss Vit d and B12 with Dr. Ronnald Ramp Dexa scan in years. Ordered today and will fup. Exercise will increase once you have more energy Still refusing screens Information regarding all screens, hepatitis, hiv etc in patient information  These are the goals we discussed: Goals    . Exercise 3x per week (30 min per time)     Rides 25 minutes but would like to increase to 40-45 min    . Weight < 200 lb (90.719 kg)     Wants to lose 10lbs; If she had more energy she would ride her bike more Also would walk more; Doesn't eat a lot of sweets; Eats vegetables and fruit;  Exercise;  (recommended 30 minutes; 5 days a week) BMI reviewed and educated Educated regarding online nutrition programs as GumSearch.nl and http://vang.com/; fit58me; weightwatchers has online            This is a list of the screening recommended for you and due dates:  Health Maintenance  Topic Date Due  . Shingles Vaccine  05/13/1998  . DEXA scan (bone density measurement)  05/14/2003  . Pneumonia vaccines (1 of 2 - PCV13) 05/14/2003  . Flu Shot  11/23/2014  . Colon Cancer Screening  02/07/2016  . Tetanus Vaccine  04/25/2019     Bone Densitometry Bone densitometry is a special X-ray that measures your bone density and can be used to help predict your risk of bone fractures. This test is used to determine bone mineral content and density to diagnose osteoporosis. Osteoporosis is the loss of bone that may cause the bone to become weak. Osteoporosis commonly occurs in women entering menopause. However, it may be found in men and in people with other diseases. PREPARATION FOR TEST No preparation necessary. WHO SHOULD BE TESTED?  All women older than  8.  Postmenopausal women (50 to 84) with risk factors for osteoporosis.  People with a previous fracture caused by normal activities.  People with a small body frame (less than 127 poundsor a body mass index [BMI] of less than 21).  People who have a parent with a hip fracture or history of osteoporosis.  People who smoke.  People who have rheumatoid arthritis.  Anyone who engages in excessive alcohol use (more than 3 drinks most days).  Women who experience early menopause. WHEN SHOULD YOU BE RETESTED? Current guidelines suggest that you should wait at least 2 years before doing a bone density test again if your first test was normal.Recent studies indicated that women with normal bone density may be able to wait a few years before needing to repeat a bone density test. You should discuss this with your caregiver.  NORMAL FINDINGS   Normal: less than standard deviation below normal (greater than -1).  Osteopenia: 1 to 2.5 standard deviations below normal (-1 to -2.5).  Osteoporosis: greater than 2.5 standard deviations below normal (less than -2.5). Test results are reported as a "T score" and a "Z score."The T score is a number that compares your bone density with the bone density of healthy, young women.The Z score is a number that compares your bone density with the scores of women who are the same age, gender, and race.  Ranges for normal findings  may vary among different laboratories and hospitals. You should always check with your doctor after having lab work or other tests done to discuss the meaning of your test results and whether your values are considered within normal limits. MEANING OF TEST  Your caregiver will go over the test results with you and discuss the importance and meaning of your results, as well as treatment options and the need for additional tests if necessary. OBTAINING THE TEST RESULTS It is your responsibility to obtain your test results. Ask the  lab or department performing the test when and how you will get your results. Document Released: 05/02/2004 Document Revised: 07/03/2011 Document Reviewed: 05/25/2010 [facility] Patient Information 2015 [place], [place]. This information is not intended to replace advice given to you by your health care provider. Make sure you discuss any questions you have with your health care provider.  Fat and Cholesterol Control Diet Fat and cholesterol levels in your blood and organs are influenced by your diet. High levels of fat and cholesterol may lead to diseases of the heart, small and large blood vessels, gallbladder, liver, and pancreas. CONTROLLING FAT AND CHOLESTEROL WITH DIET Although exercise and lifestyle factors are important, your diet is key. That is because certain foods are known to raise cholesterol and others to lower it. The goal is to balance foods for their effect on cholesterol and more importantly, to replace saturated and trans fat with other types of fat, such as monounsaturated fat, polyunsaturated fat, and omega-3 fatty acids. On average, a person should consume no more than 15 to 17 g of saturated fat daily. Saturated and trans fats are considered "bad" fats, and they will raise LDL cholesterol. Saturated fats are primarily found in animal products such as meats, butter, and cream. However, that does not mean you need to give up all your favorite foods. Today, there are good tasting, low-fat, low-cholesterol substitutes for most of the things you like to eat. Choose low-fat or nonfat alternatives. Choose round or loin cuts of red meat. These types of cuts are lowest in fat and cholesterol. Chicken (without the skin), fish, veal, and ground [place] breast are great choices. Eliminate fatty meats, such as hot dogs and salami. Even shellfish have little or no saturated fat. Have a 3 oz (85 g) portion when you eat lean meat, poultry, or fish. Trans fats are also called "partially hydrogenated  oils." They are oils that have been scientifically manipulated so that they are solid at room temperature resulting in a longer shelf life and improved taste and texture of foods in which they are added. Trans fats are found in stick margarine, some tub margarines, cookies, crackers, and baked goods.  When baking and cooking, oils are a great substitute for butter. The monounsaturated oils are especially beneficial since it is believed they lower LDL and raise HDL. The oils you should avoid entirely are saturated tropical oils, such as coconut and palm.  Remember to eat a lot from food groups that are naturally free of saturated and trans fat, including fish, fruit, vegetables, beans, grains (barley, rice, couscous, bulgur wheat), and pasta (without cream sauces).  IDENTIFYING FOODS THAT LOWER FAT AND CHOLESTEROL  Soluble fiber may lower your cholesterol. This type of fiber is found in fruits such as apples, vegetables such as broccoli, potatoes, and carrots, legumes such as beans, peas, and lentils, and grains such as barley. Foods fortified with plant sterols (phytosterol) may also lower cholesterol. You should eat at least 2 g per  day of these foods for a cholesterol lowering effect.  Read package labels to identify low-saturated fats, trans fat free, and low-fat foods at the supermarket. Select cheeses that have only 2 to 3 g saturated fat per ounce. Use a heart-healthy tub margarine that is free of trans fats or partially hydrogenated oil. When buying baked goods (cookies, crackers), avoid partially hydrogenated oils. Breads and muffins should be made from whole grains (whole-wheat or whole oat flour, instead of "flour" or "enriched flour"). Buy non-creamy canned soups with reduced salt and no added fats.  FOOD PREPARATION TECHNIQUES  Never deep-fry. If you must fry, either stir-fry, which uses very little fat, or use non-stick cooking sprays. When possible, broil, bake, or roast meats, and steam  vegetables. Instead of putting butter or margarine on vegetables, use lemon and herbs, applesauce, and cinnamon (for squash and sweet potatoes). Use nonfat yogurt, salsa, and low-fat dressings for salads.  LOW-SATURATED FAT / LOW-FAT FOOD SUBSTITUTES Meats / Saturated Fat (g)  Avoid: Steak, marbled (3 oz/85 g) / 11 g  Choose: Steak, lean (3 oz/85 g) / 4 g  Avoid: Hamburger (3 oz/85 g) / 7 g  Choose: Hamburger, lean (3 oz/85 g) / 5 g  Avoid: Ham (3 oz/85 g) / 6 g  Choose: Ham, lean cut (3 oz/85 g) / 2.4 g  Avoid: Chicken, with skin, dark meat (3 oz/85 g) / 4 g  Choose: Chicken, skin removed, dark meat (3 oz/85 g) / 2 g  Avoid: Chicken, with skin, light meat (3 oz/85 g) / 2.5 g  Choose: Chicken, skin removed, light meat (3 oz/85 g) / 1 g Dairy / Saturated Fat (g)  Avoid: Whole milk (1 cup) / 5 g  Choose: Low-fat milk, 2% (1 cup) / 3 g  Choose: Low-fat milk, 1% (1 cup) / 1.5 g  Choose: Skim milk (1 cup) / 0.3 g  Avoid: Hard cheese (1 oz/28 g) / 6 g  Choose: Skim milk cheese (1 oz/28 g) / 2 to 3 g  Avoid: Cottage cheese, 4% fat (1 cup) / 6.5 g  Choose: Low-fat cottage cheese, 1% fat (1 cup) / 1.5 g  Avoid: Ice cream (1 cup) / 9 g  Choose: Sherbet (1 cup) / 2.5 g  Choose: Nonfat frozen yogurt (1 cup) / 0.3 g  Choose: Frozen fruit bar / trace  Avoid: Whipped cream (1 tbs) / 3.5 g  Choose: Nondairy whipped topping (1 tbs) / 1 g Condiments / Saturated Fat (g)  Avoid: Mayonnaise (1 tbs) / 2 g  Choose: Low-fat mayonnaise (1 tbs) / 1 g  Avoid: Butter (1 tbs) / 7 g  Choose: Extra light margarine (1 tbs) / 1 g  Avoid: Coconut oil (1 tbs) / 11.8 g  Choose: Olive oil (1 tbs) / 1.8 g  Choose: Corn oil (1 tbs) / 1.7 g  Choose: Safflower oil (1 tbs) / 1.2 g  Choose: Sunflower oil (1 tbs) / 1.4 g  Choose: Soybean oil (1 tbs) / 2.4 g  Choose: Canola oil (1 tbs) / 1 g Document Released: 04/10/2005 Document Revised: 08/05/2012 Document Reviewed:  07/09/2013 [employer] Patient Information 2015 [place], [place]. This information is not intended to replace advice given to you by your health care provider. Make sure you discuss any questions you have with your health care provider.  Fall Prevention and Home Safety Falls cause injuries and can affect all age groups. It is possible to prevent falls.  HOW TO PREVENT FALLS  Wear shoes  with rubber soles that do not have an opening for your toes.  Keep the inside and outside of your house well lit.  Use night lights throughout your home.  Remove clutter from floors.  Clean up floor spills.  Remove throw rugs or fasten them to the floor with carpet tape.  Do not place electrical cords across pathways.  Put grab bars by your tub, shower, and toilet. Do not use towel bars as grab bars.  Put handrails on both sides of the stairway. Fix loose handrails.  Do not climb on stools or stepladders, if possible.  Do not wax your floors.  Repair uneven or unsafe sidewalks, walkways, or stairs.  Keep items you use a lot within reach.  Be aware of pets.  Keep emergency numbers next to the telephone.  Put smoke detectors in your home and near bedrooms. Ask your doctor what other things you can do to prevent falls. Document Released: 02/04/2009 Document Revised: 10/10/2011 Document Reviewed: 07/11/2011 [facility] Patient Information 2015 [place], [place]. This information is not intended to replace advice given to you by your health care provider. Make sure you discuss any questions you have with your health care provider.

## 2014-08-06 NOTE — Progress Notes (Signed)
Pre visit review using our clinic review tool, if applicable. No additional management support is needed unless otherwise documented below in the visit note. 

## 2014-08-06 NOTE — Progress Notes (Signed)
Subjective:   Tonya [name] is a 74 y.o. female who presents for Medicare Annual (Subsequent) preventive examination.  Eye exam; Will try to do when financially able Dentist: postpone dentist until financially feasible Fall Risk and general Safety reviewed Assess fear of falling ___  Educated on prevention falls; Exercise, toning and strengthening; Balance exercises  Comfortable shoes Regular vision checks Home safety; removal of throw rugs; bathroom handrails; Unlevel surfaces outside; stumps;  Avoiding ice; climbing on ladders Getting up and down slowly Bone density scan as appropriate Calcium and Vit D as appropriate / to discuss with the doctor Any changes in medication   Other Safety Review Firearm safety as appropriate; son had firearms but stays safe Assessed for community safety / feels safe Emergency Plan for illness or other / yes Driving / no issues Sun protection/     Review of Systems:   Cardiac Risk Factors include: advanced age (>11men, >26 women);dyslipidemia;hypertension     Objective:     Vitals: BP 140/90 mmHg  Pulse 64  Temp(Src) 97.9 F (36.6 C) (Oral)  Ht 5' 11.5" (1.816 m)  Wt 197 lb 4 oz (89.472 kg)  BMI 27.13 kg/m2  SpO2 97%  Tobacco History  Smoking status  . Never Smoker   Smokeless tobacco  . Never Used     Counseling given: Yes   Past Medical History  Diagnosis Date  . Gallstones   . Cerebrovascular accident   . Hypertension   . Enlarged heart     per pt  . Chronic edema     ? venous insufficiency  . GERD (gastroesophageal reflux disease)   . Hyperlipidemia   . COPD (chronic obstructive pulmonary disease)     never had a problem breathing   Past Surgical History  Procedure Laterality Date  . Abdominal hysterectomy    . Cholecystectomy     Family History  Problem Relation Age of Onset  . Cirrhosis Mother   . Heart disease Father   . Cancer Neg Hx     colon   History  Sexual Activity  . Sexual Activity:  Not Currently    Outpatient Encounter Prescriptions as of 08/06/2014  Medication Sig  . amLODipine (NORVASC) 5 MG tablet Take 1 tablet (5 mg total) by mouth daily.  Marland Kitchen aspirin 81 MG tablet Take 81 mg by mouth daily.    . Calcium Carbonate-Vitamin D (CALCIUM 600 + D PO) Take by mouth daily.    . carvedilol (COREG) 6.25 MG tablet Take 1 tablet (6.25 mg total) by mouth 2 (two) times daily with a meal.  . cholestyramine (QUESTRAN) 4 G packet Take 1 packet (4 g total) by mouth 2 (two) times daily.  . hydrochlorothiazide (HYDRODIURIL) 25 MG tablet Take 1 tablet (25 mg total) by mouth daily.  . Multiple Vitamins-Minerals (CENTRUM SILVER PO) Take by mouth daily.    . Niacin-Simvastatin 500-40 MG TB24 Take 1 tablet by mouth daily.  Marland Kitchen tolterodine (DETROL LA) 4 MG 24 hr capsule Take 1 capsule (4 mg total) by mouth daily.  . valsartan (DIOVAN) 160 MG tablet Take 1 tablet (160 mg total) by mouth daily.  . ranitidine (ZANTAC) 300 MG tablet Take 1 tablet (300 mg total) by mouth at bedtime. (Patient not taking: Reported on 08/06/2014)    Activities of Daily Living In your present state of health, do you have any difficulty performing the following activities: 08/06/2014  Hearing? N  Vision? N  Difficulty concentrating or making decisions? N  Walking or climbing stairs? Y  Dressing or bathing? N  Doing errands, shopping? N  Preparing Food and eating ? N  Using the Toilet? N  In the past six months, have you accidently leaked urine? N  Do you have problems with loss of bowel control? N  Managing your Medications? N  Managing your Finances? N  Housekeeping or managing your Housekeeping? N    Patient Care Team: Janith Lima, MD as PCP - General    Assessment:     Exercise Activities and Dietary recommendations Current Exercise Habits:: Home exercise routine, Time (Minutes): 25, Frequency (Times/Week): 4, Weekly Exercise (Minutes/Week): 100  Goals    . Exercise 3x per week (30 min per time)      Rides 25 minutes but would like to increase to 40-45 min    . Weight < 200 lb (90.719 kg)     Wants to lose 10lbs; If she had more energy she would ride her bike more Also would walk more; Doesn't eat a lot of sweets; Eats vegetables and fruit;  Exercise;  (recommended 30 minutes; 5 days a week) BMI reviewed and educated Educated regarding online nutrition programs as GumSearch.nl and http://vang.com/; fit86me; weightwatchers has online           Fall Risk Fall Risk  08/06/2014 07/02/2013  Falls in the past year? No Yes  Risk for fall due to : - Impaired balance/gait   Depression Screen PHQ 2/9 Scores 08/06/2014 07/02/2013  PHQ - 2 Score 0 0     Cognitive Testing MMSE - Mini Mental State Exam 08/06/2014  Orientation to time 5  Orientation to Place 5  Registration 3  Attention/ Calculation 2  Recall 3  Language- name 2 objects 2  Language- repeat 1  Language- follow 3 step command 3  Language- read & follow direction 1  Write a sentence 1  Copy design 1  Total score 27    Immunization History  Administered Date(s) Administered  . Td 04/24/2009   Screening Tests Health Maintenance  Topic Date Due  . ZOSTAVAX  05/13/1998  . DEXA SCAN  05/14/2003  . PNA vac Low Risk Adult (1 of 2 - PCV13) 05/14/2003  . INFLUENZA VACCINE  11/23/2014  . COLONOSCOPY  02/07/2016  . TETANUS/TDAP  04/25/2019      Plan:    During the course of the visit the patient was educated and counseled about the following appropriate screening and preventive services:   Vaccines to include Pneumoccal, Influenza, Hepatitis B, Td, Zostavax, HCV/ given in patient information  Electrocardiogram- deferred  Cardiovascular Disease / reviewed/ chol good/ wants to lose weight  Colorectal cancer screening ; not due  Bone density screening - ordered today  Diabetes screening / na/  Glaucoma screening  To be done with vision check  Mammography/PAP YEs; due the 21st and scheduled  Nutrition  counseling   Patient Instructions (the written plan) was given to the patient.   Wynetta Fines, RN  08/06/2014

## 2014-08-09 NOTE — Assessment & Plan Note (Signed)
She has achieved her LDL goal and is doing well on the statin

## 2014-08-09 NOTE — Assessment & Plan Note (Signed)
She has prediabetes No meds are needed She will cont to work on her lifestyle modifications

## 2014-08-09 NOTE — Assessment & Plan Note (Signed)

## 2014-08-09 NOTE — Assessment & Plan Note (Signed)
Her BP is not well controlled Will increase the dose of carvedilol

## 2014-08-09 NOTE — Progress Notes (Signed)
   Subjective:    Patient ID: Tonya [name], female    DOB: Sep 05, 1940, 74 y.o.   MRN: [id number]  Hypertension This is a chronic problem. The current episode started more than 1 year ago. The problem has been gradually worsening since onset. The problem is uncontrolled. Pertinent negatives include no anxiety, blurred vision, chest pain, headaches, malaise/fatigue, neck pain, orthopnea, palpitations, peripheral edema, PND, shortness of breath or sweats. Past treatments include beta blockers, diuretics and angiotensin blockers. The current treatment provides moderate improvement. There are no compliance problems.  Hypertensive end-organ damage includes CVA.      Review of Systems  Constitutional: Negative.  Negative for fever, chills, malaise/fatigue, diaphoresis, appetite change and fatigue.  HENT: Negative.   Eyes: Negative.  Negative for blurred vision.  Respiratory: Negative.  Negative for shortness of breath.   Cardiovascular: Negative.  Negative for chest pain, palpitations, orthopnea, leg swelling and PND.  Gastrointestinal: Positive for constipation. Negative for nausea, vomiting, abdominal pain and diarrhea.  Endocrine: Negative.   Genitourinary: Negative.   Musculoskeletal: Negative.  Negative for back pain, arthralgias and neck pain.  Skin: Positive for rash.  Allergic/Immunologic: Negative.   Neurological: Negative.  Negative for dizziness and headaches.  Hematological: Negative.  Negative for adenopathy. Does not bruise/bleed easily.  Psychiatric/Behavioral: Negative.        Objective:   Physical Exam  Constitutional: She is oriented to person, place, and time. She appears well-developed and well-nourished. No distress.  HENT:  Head: Normocephalic and atraumatic.  Mouth/Throat: Oropharynx is clear and moist. No oropharyngeal exudate.  Eyes: Conjunctivae are normal. Right eye exhibits no discharge. Left eye exhibits no discharge. No scleral icterus.  Neck: Normal range  of motion. Neck supple. No JVD present. No tracheal deviation present. No thyromegaly present.  Cardiovascular: Normal rate, regular rhythm, normal heart sounds and intact distal pulses.  Exam reveals no gallop and no friction rub.   No murmur heard. Pulmonary/Chest: Effort normal and breath sounds normal. No stridor. No respiratory distress. She has no wheezes. She has no rales. She exhibits no tenderness.  Abdominal: Soft. Bowel sounds are normal. She exhibits no distension and no mass. There is no tenderness. There is no rebound and no guarding.  Musculoskeletal: Normal range of motion. She exhibits no edema or tenderness.  Lymphadenopathy:    She has no cervical adenopathy.  Neurological: She is oriented to person, place, and time.  Skin: Skin is warm and dry. No rash noted. She is not diaphoretic. No erythema. No pallor.  Psychiatric: She has a normal mood and affect. Her behavior is normal. Judgment and thought content normal.  Vitals reviewed.    Lab Results  Component Value Date   WBC 5.1 06/29/2014   HGB 12.8 06/29/2014   HCT 39.0 06/29/2014   PLT 230.0 06/29/2014   GLUCOSE 112* 06/29/2014   CHOL 170 06/29/2014   TRIG 113.0 06/29/2014   HDL 45.70 06/29/2014   LDLCALC 102* 06/29/2014   ALT 17 07/30/2013   AST 22 07/30/2013   NA 140 06/29/2014   K 3.5 06/29/2014   CL 104 06/29/2014   CREATININE 0.72 06/29/2014   BUN 11 06/29/2014   CO2 31 06/29/2014   TSH 2.34 06/29/2014   HGBA1C 6.0 07/30/2013       Assessment & Plan:

## 2014-08-31 ENCOUNTER — Other Ambulatory Visit: Payer: Self-pay | Admitting: Internal Medicine

## 2014-08-31 DIAGNOSIS — Z1231 Encounter for screening mammogram for malignant neoplasm of breast: Secondary | ICD-10-CM

## 2014-09-09 ENCOUNTER — Inpatient Hospital Stay: Admission: RE | Admit: 2014-09-09 | Payer: Commercial Managed Care - HMO | Source: Ambulatory Visit

## 2014-09-09 ENCOUNTER — Other Ambulatory Visit: Payer: Self-pay | Admitting: Internal Medicine

## 2014-09-09 DIAGNOSIS — M8580 Other specified disorders of bone density and structure, unspecified site: Secondary | ICD-10-CM

## 2014-10-01 ENCOUNTER — Ambulatory Visit (HOSPITAL_COMMUNITY)
Admission: RE | Admit: 2014-10-01 | Discharge: 2014-10-01 | Disposition: A | Discharge status: Home / Self Care | Payer: Commercial Managed Care - HMO | Source: Ambulatory Visit | Attending: Internal Medicine | Admitting: Internal Medicine

## 2014-10-01 ENCOUNTER — Other Ambulatory Visit: Payer: Self-pay | Admitting: Internal Medicine

## 2014-10-01 DIAGNOSIS — Z1231 Encounter for screening mammogram for malignant neoplasm of breast: Secondary | ICD-10-CM

## 2014-10-01 LAB — HM MAMMOGRAPHY: HM Mammogram: NORMAL

## 2014-10-01 NOTE — Addendum Note (Signed)
Addended by: Janith Lima on: 10/01/2014 03:57 PM   Modules accepted: Miquel Dunn

## 2014-10-20 ENCOUNTER — Encounter: Payer: Self-pay | Admitting: Internal Medicine

## 2014-10-20 ENCOUNTER — Encounter (INDEPENDENT_AMBULATORY_CARE_PROVIDER_SITE_OTHER): Payer: Self-pay

## 2014-10-20 ENCOUNTER — Ambulatory Visit (INDEPENDENT_AMBULATORY_CARE_PROVIDER_SITE_OTHER): Payer: Commercial Managed Care - HMO | Admitting: Internal Medicine

## 2014-10-20 VITALS — BP 138/80 | HR 49 | Temp 98.4°F | Resp 16 | Ht 71.0 in | Wt 194.5 lb

## 2014-10-20 DIAGNOSIS — I10 Essential (primary) hypertension: Secondary | ICD-10-CM

## 2014-10-20 DIAGNOSIS — R001 Bradycardia, unspecified: Secondary | ICD-10-CM | POA: Diagnosis not present

## 2014-10-20 DIAGNOSIS — R42 Dizziness and giddiness: Secondary | ICD-10-CM | POA: Diagnosis not present

## 2014-10-20 MED ORDER — CARVEDILOL 6.25 MG PO TABS: 6.2500 mg | ORAL_TABLET | Freq: Two times a day (BID) | ORAL | Status: DC

## 2014-10-20 NOTE — Progress Notes (Signed)
Subjective:  Patient ID: Tonya [name], female    DOB: [birth year]  Age: 74 y.o. MRN: [id number]  CC: Hypertension   HPI Tonya [name] presents for the complaint that she has not felt well for the last few months since bystolic was changed to carvedilol due to cost concerns - she has felt tired, near-syncopal, dizzy, and feels her heart pounding.  Outpatient Prescriptions Prior to Visit  Medication Sig Dispense Refill  . amLODipine (NORVASC) 5 MG tablet Take 1 tablet (5 mg total) by mouth daily. 90 tablet 3  . aspirin 81 MG tablet Take 81 mg by mouth daily.      . Calcium Carbonate-Vitamin D (CALCIUM 600 + D PO) Take by mouth daily.      . Cholecalciferol 2000 UNITS TABS Take 1 tablet (2,000 Units total) by mouth daily. 90 tablet 3  . cholestyramine (QUESTRAN) 4 G packet Take 1 packet (4 g total) by mouth 2 (two) times daily. 60 each 11  . cyanocobalamin 2000 MCG tablet Take 1 tablet (2,000 mcg total) by mouth daily. 90 tablet 3  . hydrochlorothiazide (HYDRODIURIL) 25 MG tablet Take 1 tablet (25 mg total) by mouth daily. 90 tablet 1  . Multiple Vitamins-Minerals (CENTRUM SILVER PO) Take by mouth daily.      . Niacin-Simvastatin 500-40 MG TB24 Take 1 tablet by mouth daily. 90 tablet 3  . ranitidine (ZANTAC) 300 MG tablet Take 1 tablet (300 mg total) by mouth at bedtime. 90 tablet 3  . tolterodine (DETROL LA) 4 MG 24 hr capsule Take 1 capsule (4 mg total) by mouth daily. 90 capsule 3  . valsartan (DIOVAN) 160 MG tablet Take 1 tablet (160 mg total) by mouth daily. 90 tablet 3  . carvedilol (COREG) 12.5 MG tablet Take 1 tablet (12.5 mg total) by mouth 2 (two) times daily. 60 tablet 11   No facility-administered medications prior to visit.    ROS Review of Systems  Constitutional: Positive for fatigue. Negative for fever, chills, diaphoresis, appetite change and unexpected weight change.  HENT: Negative.   Eyes: Negative.   Respiratory: Negative.  Negative for cough, choking, chest  tightness, shortness of breath, wheezing and stridor.   Cardiovascular: Negative.  Negative for chest pain, palpitations and leg swelling.  Gastrointestinal: Negative.  Negative for abdominal pain.  Endocrine: Negative.   Genitourinary: Negative.  Negative for difficulty urinating.  Musculoskeletal: Negative.   Skin: Negative.   Allergic/Immunologic: Negative.   Neurological: Positive for dizziness and light-headedness. Negative for tremors, syncope, facial asymmetry, speech difficulty, weakness, numbness and headaches.  Hematological: Negative.  Negative for adenopathy. Does not bruise/bleed easily.  Psychiatric/Behavioral: Negative.     Objective:  BP 138/80 mmHg  Pulse 49  Temp(Src) 98.4 F (36.9 C) (Oral)  Ht 5\' 11"  (1.803 m)  Wt 194 lb 8 oz (88.225 kg)  BMI 27.14 kg/m2  SpO2 96%  BP Readings from Last 3 Encounters:  10/20/14 138/80  08/06/14 144/92  06/29/14 136/72    Wt Readings from Last 3 Encounters:  10/20/14 194 lb 8 oz (88.225 kg)  08/06/14 197 lb 4 oz (89.472 kg)  06/29/14 194 lb (87.998 kg)    Physical Exam  Constitutional: She is oriented to person, place, and time. She appears well-developed and well-nourished. No distress.  HENT:  Head: Normocephalic and atraumatic.  Mouth/Throat: Oropharynx is clear and moist. No oropharyngeal exudate.  Eyes: Conjunctivae are normal. Right eye exhibits no discharge. Left eye exhibits no discharge. No scleral icterus.  Neck:  Normal range of motion. Neck supple. No JVD present. No tracheal deviation present. No thyromegaly present.  Cardiovascular: Regular rhythm, normal heart sounds and intact distal pulses.  Bradycardia present.  Exam reveals no gallop and no friction rub.   No murmur heard. Pulses:      Carotid pulses are 1+ on the right side, and 1+ on the left side.      Radial pulses are 1+ on the right side, and 1+ on the left side.       Femoral pulses are 1+ on the right side, and 1+ on the left side.       Popliteal pulses are 1+ on the right side, and 1+ on the left side.       Dorsalis pedis pulses are 1+ on the right side, and 1+ on the left side.       Posterior tibial pulses are 1+ on the right side, and 1+ on the left side.  EKG today  Sinus  Bradycardia  WITHIN NORMAL LIMITS  Pulmonary/Chest: Effort normal and breath sounds normal. No stridor. No respiratory distress. She has no wheezes. She has no rales. She exhibits no tenderness.  Abdominal: Soft. Bowel sounds are normal. She exhibits no distension and no mass. There is no tenderness. There is no rebound and no guarding.  Musculoskeletal: Normal range of motion. She exhibits no edema or tenderness.  Lymphadenopathy:    She has no cervical adenopathy.  Neurological: She is oriented to person, place, and time.  Skin: Skin is warm and dry. No rash noted. She is not diaphoretic. No erythema. No pallor.  Vitals reviewed.   Lab Results  Component Value Date   WBC 5.1 06/29/2014   HGB 12.8 06/29/2014   HCT 39.0 06/29/2014   PLT 230.0 06/29/2014   GLUCOSE 112* 06/29/2014   CHOL 170 06/29/2014   TRIG 113.0 06/29/2014   HDL 45.70 06/29/2014   LDLCALC 102* 06/29/2014   ALT 17 07/30/2013   AST 22 07/30/2013   NA 140 06/29/2014   K 3.5 06/29/2014   CL 104 06/29/2014   CREATININE 0.72 06/29/2014   BUN 11 06/29/2014   CO2 31 06/29/2014   TSH 2.34 06/29/2014   HGBA1C 6.0 07/30/2013    Mm Digital Screening Bilateral  10/01/2014   CLINICAL DATA:  Screening.  EXAM: DIGITAL SCREENING BILATERAL MAMMOGRAM WITH CAD  COMPARISON:  Previous exam(s).  ACR Breast Density Category b: There are scattered areas of fibroglandular density.  FINDINGS: There are no findings suspicious for malignancy. Patient has a history of left-sided stroke. This slightly limits positioning of the left CC and MLO mammographic views. This study provided represent the best images available given difficulties in position. Images were processed with CAD.  IMPRESSION: No  mammographic evidence of malignancy. A result letter of this screening mammogram will be mailed directly to the patient.  RECOMMENDATION: Screening mammogram in one year. (Code:SM-B-01Y)  BI-RADS CATEGORY  2: Benign.   Electronically Signed   By: Andres Shad   On: 10/01/2014 15:50    Assessment & Plan:   Analysa was seen today for hypertension.  Diagnoses and all orders for this visit:  Dizziness and giddiness - this is caused by the low HR, will lower her coreg dose  Bradycardia - this is confirmed by EKG, will lower the coreg dose Orders: -     EKG 12-Lead  Essential hypertension - her BP is well controlled Orders: -     carvedilol (COREG) 6.25 MG tablet; Take  1 tablet (6.25 mg total) by mouth 2 (two) times daily with a meal.   I have discontinued Ms. Bohnsack's carvedilol. I am also having her start on carvedilol. Additionally, I am having her maintain her aspirin, Calcium Carbonate-Vitamin D (CALCIUM 600 + D PO), Multiple Vitamins-Minerals (CENTRUM SILVER PO), Niacin-Simvastatin, amLODipine, valsartan, ranitidine, cholestyramine, tolterodine, hydrochlorothiazide, Cholecalciferol, and cyanocobalamin.  Meds ordered this encounter  Medications  . carvedilol (COREG) 6.25 MG tablet    Sig: Take 1 tablet (6.25 mg total) by mouth 2 (two) times daily with a meal.    Dispense:  60 tablet    Refill:  3     Follow-up: No Follow-up on file.  Scarlette Calico, MD

## 2014-10-20 NOTE — Progress Notes (Signed)
Pre visit review using our clinic review tool, if applicable. No additional management support is needed unless otherwise documented below in the visit note. 

## 2014-10-20 NOTE — Patient Instructions (Signed)

## 2014-10-20 NOTE — Addendum Note (Signed)
Addended by: Janith Lima on: 10/20/2014 12:13 PM   Modules accepted: Miquel Dunn

## 2014-11-11 ENCOUNTER — Ambulatory Visit (INDEPENDENT_AMBULATORY_CARE_PROVIDER_SITE_OTHER)
Admission: RE | Admit: 2014-11-11 | Discharge: 2014-11-11 | Disposition: A | Discharge status: Home / Self Care | Payer: Commercial Managed Care - HMO | Source: Ambulatory Visit | Attending: Internal Medicine | Admitting: Internal Medicine

## 2014-11-11 DIAGNOSIS — M8580 Other specified disorders of bone density and structure, unspecified site: Secondary | ICD-10-CM | POA: Diagnosis not present

## 2014-11-12 LAB — HM PAP SMEAR: HM Pap smear: -1.8

## 2014-11-12 NOTE — Addendum Note (Signed)
Addended by: Janith Lima on: 11/12/2014 09:08 AM   Modules accepted: Miquel Dunn

## 2014-12-15 DIAGNOSIS — H5210 Myopia, unspecified eye: Secondary | ICD-10-CM | POA: Diagnosis not present

## 2014-12-15 DIAGNOSIS — H25819 Combined forms of age-related cataract, unspecified eye: Secondary | ICD-10-CM | POA: Diagnosis not present

## 2014-12-15 DIAGNOSIS — I10 Essential (primary) hypertension: Secondary | ICD-10-CM | POA: Diagnosis not present

## 2014-12-15 DIAGNOSIS — H5200 Hypermetropia, unspecified eye: Secondary | ICD-10-CM | POA: Diagnosis not present

## 2014-12-15 DIAGNOSIS — H2510 Age-related nuclear cataract, unspecified eye: Secondary | ICD-10-CM | POA: Diagnosis not present

## 2014-12-29 ENCOUNTER — Other Ambulatory Visit: Payer: Self-pay | Admitting: Internal Medicine

## 2015-01-05 ENCOUNTER — Other Ambulatory Visit: Payer: Self-pay | Admitting: Internal Medicine

## 2015-01-19 ENCOUNTER — Ambulatory Visit: Payer: Commercial Managed Care - HMO | Admitting: Internal Medicine

## 2015-02-15 ENCOUNTER — Telehealth: Payer: Self-pay | Admitting: Internal Medicine

## 2015-02-15 NOTE — Telephone Encounter (Signed)
Patient Name: Tonya [name]  DOB: 15-Mar-1941    Initial Comment caller states mother is dizzy and SOB   Nurse Assessment  Nurse: Thad Ranger, RN, Langley Gauss Date/Time (Eastern Time): 02/15/2015 2:06:49 PM  Confirm and document reason for call. If symptomatic, describe symptoms. ---Caller states mother is dizzy and SOB. States this has been going on for several weeks and her mother wants to make an appt. States her mother is not with her right now for assess/triage.  Has the patient traveled out of the country within the last 30 days? ---Not Applicable  Does the patient have any new or worsening symptoms? ---Yes  Will a triage be completed? ---No  Select reason for no triage. ---Other  Please document clinical information provided and list any resource used. ---Advised caller to have her mother call so the RN can adeq assess/triage and advise her what she needs to do and how quickly she needs to be evaluated. Advised I can not make an appt for her without speaking to her. Based on RN clinical knowledge, advised for dizziness and SOB reported, it sounds as if the pt needs to be evaluated in the ER. Caller states the pt is not going to ER and she will call the pt now.     Guidelines    Guideline Title Affirmed Question Affirmed Notes       Final Disposition User   Clinical Call Rupert, RN, [employer]

## 2015-02-18 ENCOUNTER — Ambulatory Visit: Payer: Commercial Managed Care - HMO | Admitting: Internal Medicine

## 2015-02-23 ENCOUNTER — Ambulatory Visit (INDEPENDENT_AMBULATORY_CARE_PROVIDER_SITE_OTHER): Payer: Commercial Managed Care - HMO | Admitting: Internal Medicine

## 2015-02-23 ENCOUNTER — Other Ambulatory Visit (INDEPENDENT_AMBULATORY_CARE_PROVIDER_SITE_OTHER): Payer: Commercial Managed Care - HMO

## 2015-02-23 ENCOUNTER — Encounter: Payer: Self-pay | Admitting: Internal Medicine

## 2015-02-23 VITALS — BP 142/88 | HR 60 | Temp 98.1°F | Resp 16 | Ht 71.0 in | Wt 195.0 lb

## 2015-02-23 DIAGNOSIS — R0602 Shortness of breath: Secondary | ICD-10-CM | POA: Diagnosis not present

## 2015-02-23 DIAGNOSIS — I10 Essential (primary) hypertension: Secondary | ICD-10-CM

## 2015-02-23 DIAGNOSIS — R9431 Abnormal electrocardiogram [ECG] [EKG]: Secondary | ICD-10-CM

## 2015-02-23 DIAGNOSIS — R739 Hyperglycemia, unspecified: Secondary | ICD-10-CM | POA: Diagnosis not present

## 2015-02-23 DIAGNOSIS — R0609 Other forms of dyspnea: Secondary | ICD-10-CM

## 2015-02-23 LAB — CBC WITH DIFFERENTIAL/PLATELET
BASOS PCT: 0.5 % (ref 0.0–3.0)
Basophils Absolute: 0 10*3/uL (ref 0.0–0.1)
EOS PCT: 1.3 % (ref 0.0–5.0)
Eosinophils Absolute: 0.1 10*3/uL (ref 0.0–0.7)
HCT: 40.1 % (ref 36.0–46.0)
Hemoglobin: 13.1 g/dL (ref 12.0–15.0)
Lymphocytes Relative: 49 % — ABNORMAL HIGH (ref 12.0–46.0)
Lymphs Abs: 2.5 10*3/uL (ref 0.7–4.0)
MCHC: 32.6 g/dL (ref 30.0–36.0)
MCV: 86.1 fl (ref 78.0–100.0)
MONO ABS: 0.4 10*3/uL (ref 0.1–1.0)
Monocytes Relative: 7.4 % (ref 3.0–12.0)
Neutro Abs: 2.1 10*3/uL (ref 1.4–7.7)
Neutrophils Relative %: 41.8 % — ABNORMAL LOW (ref 43.0–77.0)
Platelets: 243 10*3/uL (ref 150.0–400.0)
RBC: 4.66 Mil/uL (ref 3.87–5.11)
RDW: 12.7 % (ref 11.5–15.5)
WBC: 5.1 10*3/uL (ref 4.0–10.5)

## 2015-02-23 LAB — BASIC METABOLIC PANEL
BUN: 12 mg/dL (ref 6–23)
CO2: 32 mEq/L (ref 19–32)
CREATININE: 0.8 mg/dL (ref 0.40–1.20)
Calcium: 9.8 mg/dL (ref 8.4–10.5)
Chloride: 105 mEq/L (ref 96–112)
GFR: 89.5 mL/min (ref >60.00)
GLUCOSE: 127 mg/dL — AB (ref 70–99)
POTASSIUM: 4.2 meq/L (ref 3.5–5.1)
Sodium: 142 mEq/L (ref 135–145)

## 2015-02-23 LAB — HEMOGLOBIN A1C: HEMOGLOBIN A1C: 5.8 % (ref 4.6–6.5)

## 2015-02-23 LAB — BRAIN NATRIURETIC PEPTIDE: Pro B Natriuretic peptide (BNP): 133 pg/mL — ABNORMAL HIGH (ref 0.0–100.0)

## 2015-02-23 NOTE — Progress Notes (Signed)
Pre visit review using our clinic review tool, if applicable. No additional management support is needed unless otherwise documented below in the visit note. 

## 2015-02-23 NOTE — Progress Notes (Signed)
Subjective:  Patient ID: Tonya [name], female    DOB: 1940/12/22  Age: 74 y.o. MRN: [id number]  CC: Shortness of Breath   HPI Tonya [name] presents for a 3-4 month hx of DOE with dizziness and lightheadedness.  Outpatient Prescriptions Prior to Visit  Medication Sig Dispense Refill  . amLODipine (NORVASC) 5 MG tablet Take 1 tablet (5 mg total) by mouth daily. 90 tablet 3  . aspirin 81 MG tablet Take 81 mg by mouth daily.      . Calcium Carbonate-Vitamin D (CALCIUM 600 + D PO) Take by mouth daily.      . carvedilol (COREG) 6.25 MG tablet Take 1 tablet (6.25 mg total) by mouth 2 (two) times daily with a meal. 60 tablet 3  . Cholecalciferol 2000 UNITS TABS Take 1 tablet (2,000 Units total) by mouth daily. 90 tablet 3  . cholestyramine (QUESTRAN) 4 G packet DISSOLVE 1 PACKET INTO 2 TO 6 OUNCES OF WATER AND DRINK 2 TIMES A DAY. 60 packet 11  . cyanocobalamin 2000 MCG tablet Take 1 tablet (2,000 mcg total) by mouth daily. 90 tablet 3  . hydrochlorothiazide (HYDRODIURIL) 25 MG tablet TAKE 1 TABLET BY MOUTH DAILY FOR BLOOD PRESSURE AND SWELLING. 90 tablet 3  . Multiple Vitamins-Minerals (CENTRUM SILVER PO) Take by mouth daily.      . Niacin-Simvastatin 500-40 MG TB24 Take 1 tablet by mouth daily. 90 tablet 3  . ranitidine (ZANTAC) 300 MG tablet Take 1 tablet (300 mg total) by mouth at bedtime. 90 tablet 3  . tolterodine (DETROL LA) 4 MG 24 hr capsule Take 1 capsule (4 mg total) by mouth daily. 90 capsule 3  . valsartan (DIOVAN) 160 MG tablet Take 1 tablet (160 mg total) by mouth daily. 90 tablet 3   No facility-administered medications prior to visit.    ROS Review of Systems  Constitutional: Positive for fatigue. Negative for fever, chills, diaphoresis, appetite change and unexpected weight change.  HENT: Negative.  Negative for trouble swallowing and voice change.   Eyes: Negative.   Respiratory: Positive for shortness of breath. Negative for cough, choking, chest tightness, wheezing  and stridor.   Cardiovascular: Negative.  Negative for chest pain, palpitations and leg swelling.  Gastrointestinal: Negative.  Negative for nausea, vomiting, abdominal pain, diarrhea and constipation.  Endocrine: Negative.   Genitourinary: Negative.   Musculoskeletal: Negative.  Negative for myalgias, back pain, joint swelling and arthralgias.  Skin: Negative.  Negative for rash.  Allergic/Immunologic: Negative.   Neurological: Positive for dizziness and light-headedness. Negative for syncope and weakness.  Hematological: Negative.  Negative for adenopathy. Does not bruise/bleed easily.  Psychiatric/Behavioral: Negative.     Objective:  BP 142/88 mmHg  Pulse 60  Temp(Src) 98.1 F (36.7 C) (Oral)  Resp 16  Ht 5\' 11"  (1.803 m)  Wt 195 lb (88.451 kg)  BMI 27.21 kg/m2  SpO2 98%  BP Readings from Last 3 Encounters:  02/23/15 142/88  10/20/14 138/80  08/06/14 144/92    Wt Readings from Last 3 Encounters:  02/23/15 195 lb (88.451 kg)  10/20/14 194 lb 8 oz (88.225 kg)  08/06/14 197 lb 4 oz (89.472 kg)    Physical Exam  Constitutional: She is oriented to person, place, and time. She appears well-developed and well-nourished. No distress.  HENT:  Head: Normocephalic and atraumatic.  Mouth/Throat: Oropharynx is clear and moist. No oropharyngeal exudate.  Eyes: Conjunctivae are normal. Right eye exhibits no discharge. Left eye exhibits no discharge. No scleral icterus.  Neck: Normal range of motion. Neck supple. No JVD present. No tracheal deviation present. No thyromegaly present.  Cardiovascular: Normal rate, regular rhythm and intact distal pulses.  Exam reveals no gallop and no friction rub.   No murmur heard. EKG - sinus bradycardia, questionable changes with voltage loss in V2 and V3 but no Q waves, this is new compared to prior EKG  Pulmonary/Chest: Effort normal and breath sounds normal. No stridor. No respiratory distress. She has no wheezes. She has no rales. She exhibits  no tenderness.  Abdominal: Soft. Bowel sounds are normal. She exhibits no distension and no mass. There is no tenderness. There is no rebound and no guarding.  Musculoskeletal: Normal range of motion. She exhibits no edema or tenderness.  Lymphadenopathy:    She has no cervical adenopathy.  Neurological: She is oriented to person, place, and time.  Skin: Skin is warm and dry. No rash noted. She is not diaphoretic. No erythema. No pallor.  Vitals reviewed.   Lab Results  Component Value Date   WBC 5.1 06/29/2014   HGB 12.8 06/29/2014   HCT 39.0 06/29/2014   PLT 230.0 06/29/2014   GLUCOSE 112* 06/29/2014   CHOL 170 06/29/2014   TRIG 113.0 06/29/2014   HDL 45.70 06/29/2014   LDLCALC 102* 06/29/2014   ALT 17 07/30/2013   AST 22 07/30/2013   NA 140 06/29/2014   K 3.5 06/29/2014   CL 104 06/29/2014   CREATININE 0.72 06/29/2014   BUN 11 06/29/2014   CO2 31 06/29/2014   TSH 2.34 06/29/2014   HGBA1C 6.0 07/30/2013    Dg Bone Density  11/12/2014  Date of study: 11/11/2014 Exam: DUAL X-RAY ABSORPTIOMETRY (DXA) FOR BONE MINERAL DENSITY (BMD) Instrument: [employer] Provider: PCP Indication: follow up for low BMD Comparison: none (please note that it is not possible to compare data from different instruments) Clinical data: Pt is a postmenopausal 74 y.o. female without previous h/o fracture. On calcium and vitamin D. Results:  Lumbar spine L1-L3 (L4) Femoral neck (FN) T-score  +3.7  RFN: -1.8 LFN: -1.3  Assessment: the BMD is low according to the [facility] classification for osteoporosis (see below). Fracture risk: moderate FRAX score: 10 year major osteoporotic risk: 6.0 %. 10 year hip fracture risk: 1.5 %. These are under the thresholds for treatment of 20% and 3%, respectively. Comments: the technical quality of the study is good. L4 vertebra had to be excluded from analysis. Evaluation for secondary causes should be considered if clinically indicated. Recommend optimizing  calcium (1200 mg/day) and vitamin D (800 IU/day) intake. Followup: Repeat BMD is appropriate after 2 years WHO criteria for diagnosis of osteoporosis in postmenopausal women and in men 51 y/o or older: - normal: T-score -1.0 to + 1.0 - osteopenia/low bone density: T-score between -2.5 and -1.0 - osteoporosis: T-score below -2.5 - severe osteoporosis: T-score below -2.5 with history of fragility fracture Note: although not part of the WHO classification, the presence of a fragility fracture, regardless of the T-score, should be considered diagnostic of osteoporosis, provided other causes for the fracture have been excluded. Treatment: The National Osteoporosis Foundation recommends that treatment be considered in postmenopausal women and men age 68 or older with: 1. Hip or vertebral (clinical or morphometric) fracture 2. T-score of - 2.5 or lower at the spine or hip 3. 10-year fracture probability by FRAX of at least 20% for a major osteoporotic fracture and 3% for a hip fracture Philemon Kingdom, MD [facility] Endocrinology  Prior ECHO - Left ventricle: The cavity size was normal. Wall thickness was increased in a pattern of moderate LVH. There was moderate concentric hypertrophy. Systolic function was vigorous. The estimated ejection fraction was in the range of 65% to 70%. There was no dynamic obstruction, but there is turbulent flow in the LVOT and mid -cavity. Wall motion was normal; there were no regional wall motion abnormalities. There was an increased relative contribution of atrial contraction to ventricular filling. Doppler parameters are consistent with abnormal left ventricular relaxation (grade 1 diastolic dysfunction). - Mitral valve: There was systolic anterior motion.  Assessment & Plan:   Cristela was seen today for shortness of breath.  Diagnoses and all orders for this visit:  SOB (shortness of breath)- her labs and examination do not show any secondary causes  for shortness of breath. Will start a cardiac workup with a Lexiscan. -     EKG 12-Lead -     EKG 12-Lead  DOE (dyspnea on exertion)- she has a prior history of diastolic dysfunction as evidenced by the above-referenced echocardiogram. Her BNP today is very slightly elevated. I do not see any edema or fluid overload on her exam today. She is artery being diuresed with hydrochlorothiazide. I'm concerned with EKG findings that this dyspnea on exertion may represent an ischemic equivalent. I have therefore asked her to have a Lexiscan done. -     Myocardial Perfusion Imaging; Future  Nonspecific abnormal electrocardiogram (ECG) (EKG)- she will undergo a Lexiscan to be screened for cardiac ischemia. -     Myocardial Perfusion Imaging; Future   I am having Ms. Shanley maintain her aspirin, Calcium Carbonate-Vitamin D (CALCIUM 600 + D PO), Multiple Vitamins-Minerals (CENTRUM SILVER PO), Niacin-Simvastatin, amLODipine, valsartan, ranitidine, tolterodine, Cholecalciferol, cyanocobalamin, carvedilol, hydrochlorothiazide, and cholestyramine.  No orders of the defined types were placed in this encounter.     Follow-up: No Follow-up on file.  Scarlette Calico, MD

## 2015-02-23 NOTE — Patient Instructions (Signed)

## 2015-03-09 ENCOUNTER — Telehealth (HOSPITAL_COMMUNITY): Payer: Self-pay

## 2015-03-09 NOTE — Telephone Encounter (Signed)
Encounter complete. 

## 2015-03-10 ENCOUNTER — Telehealth (HOSPITAL_COMMUNITY): Payer: Self-pay

## 2015-03-10 NOTE — Telephone Encounter (Signed)
Encounter complete. 

## 2015-03-11 ENCOUNTER — Ambulatory Visit (HOSPITAL_COMMUNITY): Admission: RE | Admit: 2015-03-11 | Payer: Commercial Managed Care - HMO | Source: Ambulatory Visit

## 2015-03-24 ENCOUNTER — Telehealth (HOSPITAL_COMMUNITY): Payer: Self-pay

## 2015-03-24 NOTE — Telephone Encounter (Signed)
Encounter complete. 

## 2015-03-25 ENCOUNTER — Inpatient Hospital Stay (HOSPITAL_COMMUNITY): Admission: RE | Admit: 2015-03-25 | Payer: Commercial Managed Care - HMO | Source: Ambulatory Visit

## 2015-03-26 ENCOUNTER — Ambulatory Visit (HOSPITAL_COMMUNITY)
Admission: RE | Admit: 2015-03-26 | Discharge: 2015-03-26 | Disposition: A | Discharge status: Home / Self Care | Payer: Commercial Managed Care - HMO | Source: Ambulatory Visit | Attending: Cardiology | Admitting: Cardiology

## 2015-03-26 DIAGNOSIS — Z8249 Family history of ischemic heart disease and other diseases of the circulatory system: Secondary | ICD-10-CM | POA: Insufficient documentation

## 2015-03-26 DIAGNOSIS — R0609 Other forms of dyspnea: Secondary | ICD-10-CM | POA: Diagnosis not present

## 2015-03-26 DIAGNOSIS — R9431 Abnormal electrocardiogram [ECG] [EKG]: Secondary | ICD-10-CM | POA: Diagnosis not present

## 2015-03-26 DIAGNOSIS — I10 Essential (primary) hypertension: Secondary | ICD-10-CM | POA: Insufficient documentation

## 2015-03-26 DIAGNOSIS — Z87891 Personal history of nicotine dependence: Secondary | ICD-10-CM | POA: Diagnosis not present

## 2015-03-26 DIAGNOSIS — R9439 Abnormal result of other cardiovascular function study: Secondary | ICD-10-CM | POA: Diagnosis not present

## 2015-03-26 DIAGNOSIS — R42 Dizziness and giddiness: Secondary | ICD-10-CM | POA: Diagnosis not present

## 2015-03-26 LAB — MYOCARDIAL PERFUSION IMAGING
CHL CUP RESTING HR STRESS: 59 {beats}/min
CSEPPHR: 83 {beats}/min
LVDIAVOL: 65 mL
LVSYSVOL: 22 mL
NUC STRESS TID: 1.38
SDS: 1
SRS: 4
SSS: 5

## 2015-03-26 MED ORDER — TECHNETIUM TC 99M SESTAMIBI GENERIC - CARDIOLITE
32.5000 | Freq: Once | INTRAVENOUS | Status: AC | PRN
  Administered 2015-03-26: 32.5 via INTRAVENOUS

## 2015-03-26 MED ORDER — REGADENOSON 0.4 MG/5ML IV SOLN
0.4000 mg | Freq: Once | INTRAVENOUS | Status: AC
  Administered 2015-03-26: 0.4 mg via INTRAVENOUS

## 2015-03-26 MED ORDER — TECHNETIUM TC 99M SESTAMIBI GENERIC - CARDIOLITE
10.2000 | Freq: Once | INTRAVENOUS | Status: AC | PRN
  Administered 2015-03-26: 10.2 via INTRAVENOUS

## 2015-04-19 ENCOUNTER — Other Ambulatory Visit: Payer: Self-pay | Admitting: Internal Medicine

## 2015-05-07 ENCOUNTER — Other Ambulatory Visit: Payer: Self-pay | Admitting: Internal Medicine

## 2015-06-08 ENCOUNTER — Encounter: Payer: Self-pay | Admitting: Internal Medicine

## 2015-06-08 ENCOUNTER — Ambulatory Visit (INDEPENDENT_AMBULATORY_CARE_PROVIDER_SITE_OTHER)
Admission: RE | Admit: 2015-06-08 | Discharge: 2015-06-08 | Disposition: A | Discharge status: Home / Self Care | Payer: Medicare HMO | Source: Ambulatory Visit | Attending: Internal Medicine | Admitting: Internal Medicine

## 2015-06-08 ENCOUNTER — Ambulatory Visit (INDEPENDENT_AMBULATORY_CARE_PROVIDER_SITE_OTHER): Payer: Medicare HMO | Admitting: Internal Medicine

## 2015-06-08 VITALS — BP 144/78 | HR 80 | Temp 98.1°F | Resp 16 | Ht 71.0 in | Wt 194.0 lb

## 2015-06-08 DIAGNOSIS — M160 Bilateral primary osteoarthritis of hip: Secondary | ICD-10-CM | POA: Diagnosis not present

## 2015-06-08 DIAGNOSIS — M25552 Pain in left hip: Secondary | ICD-10-CM

## 2015-06-08 DIAGNOSIS — G8929 Other chronic pain: Secondary | ICD-10-CM | POA: Insufficient documentation

## 2015-06-08 DIAGNOSIS — M25551 Pain in right hip: Secondary | ICD-10-CM

## 2015-06-08 DIAGNOSIS — I10 Essential (primary) hypertension: Secondary | ICD-10-CM | POA: Diagnosis not present

## 2015-06-08 MED ORDER — TOLTERODINE TARTRATE ER 4 MG PO CP24: 4.0000 mg | ORAL_CAPSULE | Freq: Every day | ORAL | Status: DC

## 2015-06-08 MED ORDER — AMLODIPINE BESYLATE-VALSARTAN 5-160 MG PO TABS: 1.0000 | ORAL_TABLET | Freq: Every day | ORAL | Status: DC

## 2015-06-08 MED ORDER — HYDROCHLOROTHIAZIDE 25 MG PO TABS: ORAL_TABLET | ORAL | Status: DC

## 2015-06-08 NOTE — Patient Instructions (Signed)

## 2015-06-08 NOTE — Progress Notes (Signed)
Pre visit review using our clinic review tool, if applicable. No additional management support is needed unless otherwise documented below in the visit note. 

## 2015-06-08 NOTE — Progress Notes (Signed)
Subjective:  Patient ID: Tonya [name], female    DOB: October 07, 1940  Age: 75 y.o. MRN: [id number]  CC: Hip Pain   HPI Tonya [name] presents for the complaint of bilateral hip pain worse on the right than the left. She describes achy sensation with use. She has had this for quite a while but it worsened about 3 months ago when she got a new stationary exercise bike. After using it for 45 minutes she noticed aching in her hips. She is not use the exercise bike first 6 weeks and tells me that the aching in her hips has improved. She denied back pain and says the pain does not radiate into her lower extremities. She has chronic numbness, weakness, tingling in her left lower extremity status post a CVA but she has no new neurological complaints over the last few months.  Outpatient Prescriptions Prior to Visit  Medication Sig Dispense Refill  . amLODipine (NORVASC) 5 MG tablet Take 1 tablet (5 mg total) by mouth daily. 90 tablet 3  . aspirin 81 MG tablet Take 81 mg by mouth daily.      . Calcium Carbonate-Vitamin D (CALCIUM 600 + D PO) Take by mouth daily.      . carvedilol (COREG) 6.25 MG tablet Take 1 tablet (6.25 mg total) by mouth 2 (two) times daily with a meal. 60 tablet 3  . Cholecalciferol 2000 UNITS TABS Take 1 tablet (2,000 Units total) by mouth daily. 90 tablet 3  . cholestyramine (QUESTRAN) 4 G packet DISSOLVE 1 PACKET INTO 2 TO 6 OUNCES OF WATER AND DRINK 2 TIMES A DAY. 60 packet 11  . cyanocobalamin 2000 MCG tablet Take 1 tablet (2,000 mcg total) by mouth daily. 90 tablet 3  . Multiple Vitamins-Minerals (CENTRUM SILVER PO) Take by mouth daily.      . Niacin-Simvastatin 500-40 MG TB24 Take 1 tablet by mouth daily. 90 tablet 3  . ranitidine (ZANTAC) 300 MG tablet Take 1 tablet (300 mg total) by mouth at bedtime. 90 tablet 3  . valsartan (DIOVAN) 160 MG tablet Take 1 tablet (160 mg total) by mouth daily. 90 tablet 3  . amLODipine-valsartan (EXFORGE) 5-160 MG tablet TAKE ONE TABLET BY  MOUTH ONCE DAILY. 30 tablet 11  . hydrochlorothiazide (HYDRODIURIL) 25 MG tablet TAKE 1 TABLET BY MOUTH DAILY FOR BLOOD PRESSURE AND SWELLING. 90 tablet 3  . tolterodine (DETROL LA) 4 MG 24 hr capsule TAKE ONE CAPSULE BY MOUTH DAILY. 90 capsule 3   No facility-administered medications prior to visit.    ROS Review of Systems  Constitutional: Negative.  Negative for fever, chills, diaphoresis, appetite change and fatigue.  HENT: Negative.   Eyes: Negative.   Respiratory: Negative.  Negative for cough, choking, chest tightness, shortness of breath and stridor.   Cardiovascular: Negative.  Negative for chest pain, palpitations and leg swelling.  Gastrointestinal: Negative.  Negative for nausea, vomiting, abdominal pain, diarrhea and constipation.  Endocrine: Negative.   Genitourinary: Negative.  Negative for difficulty urinating.  Musculoskeletal: Positive for arthralgias. Negative for myalgias, back pain, joint swelling and neck pain.  Skin: Negative.  Negative for color change and rash.  Allergic/Immunologic: Negative.   Neurological: Negative.  Negative for dizziness, tremors, syncope, light-headedness and numbness.  Hematological: Negative.  Negative for adenopathy. Does not bruise/bleed easily.  Psychiatric/Behavioral: Negative.     Objective:  BP 144/78 mmHg  Pulse 80  Temp(Src) 98.1 F (36.7 C) (Oral)  Resp 16  Ht 5\' 11"  (1.803 m)  Wt 194 lb (87.998 kg)  BMI 27.07 kg/m2  SpO2 99%  BP Readings from Last 3 Encounters:  06/08/15 144/78  02/23/15 142/88  10/20/14 138/80    Wt Readings from Last 3 Encounters:  06/08/15 194 lb (87.998 kg)  03/26/15 195 lb (88.451 kg)  02/23/15 195 lb (88.451 kg)    Physical Exam  Constitutional: She is oriented to person, place, and time. No distress.  HENT:  Mouth/Throat: Oropharynx is clear and moist. No oropharyngeal exudate.  Eyes: Conjunctivae are normal. Right eye exhibits no discharge. Left eye exhibits no discharge. No  scleral icterus.  Neck: Normal range of motion. Neck supple. No JVD present. No tracheal deviation present. No thyromegaly present.  Cardiovascular: Normal rate, regular rhythm and intact distal pulses.  Exam reveals no gallop and no friction rub.   Murmur heard. Pulmonary/Chest: Effort normal and breath sounds normal. No stridor. No respiratory distress. She has no wheezes. She has no rales. She exhibits no tenderness.  Abdominal: Soft. Bowel sounds are normal. She exhibits no distension and no mass. There is no tenderness. There is no rebound and no guarding.  Musculoskeletal: Normal range of motion. She exhibits no edema or tenderness.       Right hip: Normal. She exhibits normal range of motion, normal strength, no tenderness, no bony tenderness, no swelling and no deformity.       Left hip: Normal. She exhibits normal range of motion, normal strength, no tenderness, no bony tenderness, no swelling and no deformity.       Lumbar back: Normal. She exhibits normal range of motion, no tenderness, no bony tenderness, no swelling, no edema, no deformity and no pain.  Lymphadenopathy:    She has no cervical adenopathy.  Neurological: She is oriented to person, place, and time.  Skin: Skin is warm and dry. No rash noted. She is not diaphoretic. No erythema. No pallor.  Vitals reviewed.   Lab Results  Component Value Date   WBC 5.1 02/23/2015   HGB 13.1 02/23/2015   HCT 40.1 02/23/2015   PLT 243.0 02/23/2015   GLUCOSE 127* 02/23/2015   CHOL 170 06/29/2014   TRIG 113.0 06/29/2014   HDL 45.70 06/29/2014   LDLCALC 102* 06/29/2014   ALT 17 07/30/2013   AST 22 07/30/2013   NA 142 02/23/2015   K 4.2 02/23/2015   CL 105 02/23/2015   CREATININE 0.80 02/23/2015   BUN 12 02/23/2015   CO2 32 02/23/2015   TSH 2.34 06/29/2014   HGBA1C 5.8 02/23/2015    No results found.  Assessment & Plan:   Tonya [name] was seen today for hip pain.  Diagnoses and all orders for this visit:  Hip pain,  bilateral- her exam is not remarkable for any concerns about. Former syndrome, bursitis. Her plain films show moderate degenerative joint disease. She tells me that the pain is been diminishing over the last few weeks and she does not want to take a medicine for pain at this time. -     DG HIPS BILAT WITH PELVIS MIN 5 VIEWS; Future  Essential hypertension- her blood pressures well controlled, will continue Exforge and hydrochlorothiazide  Other orders -     hydrochlorothiazide (HYDRODIURIL) 25 MG tablet; TAKE 1 TABLET BY MOUTH DAILY FOR BLOOD PRESSURE AND SWELLING. -     amLODipine-valsartan (EXFORGE) 5-160 MG tablet; Take 1 tablet by mouth daily. -     tolterodine (DETROL LA) 4 MG 24 hr capsule; Take 1 capsule (4 mg total) by mouth  daily.   I have changed Ms. Boeve's amLODipine-valsartan and tolterodine. I am also having her maintain her aspirin, Calcium Carbonate-Vitamin D (CALCIUM 600 + D PO), Multiple Vitamins-Minerals (CENTRUM SILVER PO), Niacin-Simvastatin, amLODipine, valsartan, ranitidine, Cholecalciferol, cyanocobalamin, carvedilol, cholestyramine, and hydrochlorothiazide.  Meds ordered this encounter  Medications  . hydrochlorothiazide (HYDRODIURIL) 25 MG tablet    Sig: TAKE 1 TABLET BY MOUTH DAILY FOR BLOOD PRESSURE AND SWELLING.    Dispense:  90 tablet    Refill:  3  . amLODipine-valsartan (EXFORGE) 5-160 MG tablet    Sig: Take 1 tablet by mouth daily.    Dispense:  90 tablet    Refill:  3  . tolterodine (DETROL LA) 4 MG 24 hr capsule    Sig: Take 1 capsule (4 mg total) by mouth daily.    Dispense:  90 capsule    Refill:  3     Follow-up: Return in about 3 months (around 09/05/2015).  Scarlette Calico, MD

## 2015-06-11 ENCOUNTER — Other Ambulatory Visit: Payer: Self-pay | Admitting: Internal Medicine

## 2015-06-11 DIAGNOSIS — I10 Essential (primary) hypertension: Secondary | ICD-10-CM

## 2015-06-11 MED ORDER — CYANOCOBALAMIN 2000 MCG PO TABS: 2000.0000 ug | ORAL_TABLET | Freq: Every day | ORAL | Status: DC

## 2015-06-11 MED ORDER — CARVEDILOL 6.25 MG PO TABS: 6.2500 mg | ORAL_TABLET | Freq: Two times a day (BID) | ORAL | Status: DC

## 2015-06-11 MED ORDER — NIACIN-SIMVASTATIN ER 500-40 MG PO TB24: 1.0000 | ORAL_TABLET | Freq: Every day | ORAL | Status: DC

## 2015-06-11 NOTE — Telephone Encounter (Signed)
Ok to fill pended. ?

## 2015-06-11 NOTE — Telephone Encounter (Signed)
Pt called request refill for carvedilol (COREG) 6.25 MG tablet, cyanocobalamin 2000 MCG tablet and Niacin-Simvastatin 500-40 MG TB24 to be send to [facility]. Pt also want to know what is the Coreg for?

## 2015-06-14 ENCOUNTER — Telehealth: Payer: Self-pay | Admitting: Internal Medicine

## 2015-06-14 DIAGNOSIS — I10 Essential (primary) hypertension: Secondary | ICD-10-CM

## 2015-06-14 MED ORDER — CYANOCOBALAMIN 2000 MCG PO TABS: 2000.0000 ug | ORAL_TABLET | Freq: Every day | ORAL | Status: DC

## 2015-06-14 MED ORDER — CARVEDILOL 6.25 MG PO TABS: 6.2500 mg | ORAL_TABLET | Freq: Two times a day (BID) | ORAL | Status: DC

## 2015-06-14 MED ORDER — NIACIN-SIMVASTATIN ER 500-40 MG PO TB24: 1.0000 | ORAL_TABLET | Freq: Every day | ORAL | Status: DC

## 2015-06-14 NOTE — Telephone Encounter (Signed)
Patient states that script for carvedilol and niacin was sent to the incorrect pharmacy.  States it should have been sent to [facility].  States all medications should be sent to [facility] with 3 month refills.  Patient also states that cyanocobalamin is on her chart but she is not sure what it is for and she also does not have this medication.

## 2015-06-14 NOTE — Telephone Encounter (Signed)
Pt called in and would like to talk to nurse about the bp patch.  She has heard about that and would like to talk to nurse about it

## 2015-06-14 NOTE — Telephone Encounter (Signed)
Done ot was made aware during OV that cyanobalalm is b12

## 2015-06-15 NOTE — Telephone Encounter (Signed)
Forwarding to PCP.   Do you know of a bp med that is worn like a patch?

## 2015-06-15 NOTE — Telephone Encounter (Signed)
Her blood pressure has been well controlled. She does not need to consider this at this time. If I change her blood pressure medicine in the future then we can consider a patch.

## 2015-06-16 NOTE — Telephone Encounter (Signed)
LVM for pt to call back as soon as possible.   

## 2015-06-17 ENCOUNTER — Other Ambulatory Visit: Payer: Self-pay | Admitting: Internal Medicine

## 2015-06-17 DIAGNOSIS — E785 Hyperlipidemia, unspecified: Secondary | ICD-10-CM

## 2015-06-17 MED ORDER — ATORVASTATIN CALCIUM 40 MG PO TABS: 40.0000 mg | ORAL_TABLET | Freq: Every day | ORAL | Status: DC

## 2015-06-21 ENCOUNTER — Telehealth: Payer: Self-pay

## 2015-06-21 NOTE — Telephone Encounter (Signed)
This was taking care of on February 23

## 2015-06-21 NOTE — Telephone Encounter (Signed)
Please advise, patients insurance is no longer covering simcor in any strengths. They are only doing alternative, please advise on which one

## 2015-06-22 ENCOUNTER — Telehealth: Payer: Self-pay | Admitting: Internal Medicine

## 2015-06-22 DIAGNOSIS — E785 Hyperlipidemia, unspecified: Secondary | ICD-10-CM

## 2015-06-22 NOTE — Telephone Encounter (Signed)
Pt just received her prescription for atorvastatin and she is wondering why she was given this. Can you please give her a call

## 2015-06-25 NOTE — Telephone Encounter (Signed)
Pt informed of MD response below.  

## 2015-06-25 NOTE — Telephone Encounter (Signed)
Spoke to pt and she stated "I will not go on no lipitor". Pt stated that she wanted to continue the niacin. Pt is requesting a prescription or samples of that.

## 2015-06-27 MED ORDER — SIMVASTATIN 20 MG PO TABS: 20.0000 mg | ORAL_TABLET | Freq: Every day | ORAL | Status: DC

## 2015-06-27 MED ORDER — NIACIN ER 250 MG PO TBCR: 1.0000 | EXTENDED_RELEASE_TABLET | Freq: Every day | ORAL | Status: DC

## 2015-06-27 NOTE — Telephone Encounter (Signed)
Will change

## 2015-06-28 NOTE — Telephone Encounter (Signed)
Pt informed of rx sent to pharmacy.

## 2015-07-05 ENCOUNTER — Telehealth: Payer: Self-pay | Admitting: *Deleted

## 2015-07-05 NOTE — Telephone Encounter (Signed)
Received call from [employer] needing to verify if pt suppose to be taking Simvastatin or atorvastatin. Inform pharmacist per chart pt is taking simvastatin...Johny Chess

## 2015-07-30 ENCOUNTER — Telehealth: Payer: Self-pay

## 2015-07-30 NOTE — Telephone Encounter (Signed)
fup for AWV; placed call to the patient and stated her dtr would make the apt. Would be best to call her Monday or Tuesday at [phone] Will fup

## 2015-08-02 ENCOUNTER — Telehealth: Payer: Self-pay

## 2015-08-02 NOTE — Telephone Encounter (Signed)
call to dtr number given by [name], but the "wireless customer" was not available. Will send out letter

## 2015-08-02 NOTE — Telephone Encounter (Signed)
Call to schedule AWV; did call the number for the dtr on file; LVM for return call to shedule Tonya [name] for [employer].

## 2015-08-09 NOTE — Telephone Encounter (Signed)
Call to Ms. Vessel for [employer]; the patient stated her dtr has to "work" this week and next but will call me when she gets this scheduled.

## 2015-09-07 ENCOUNTER — Telehealth: Payer: Self-pay | Admitting: Internal Medicine

## 2015-10-07 ENCOUNTER — Telehealth: Payer: Self-pay | Admitting: Internal Medicine

## 2015-10-07 NOTE — Telephone Encounter (Signed)
Tonya [name] dropped of handicapped parking application. Sending to [facility] for completion. Please give her a call when completed.

## 2015-10-08 NOTE — Telephone Encounter (Signed)
Application placed on MDs desk for signature.  

## 2015-10-12 NOTE — Telephone Encounter (Signed)
Patient called about this handicap pass. Can you follow up. Thank you.

## 2015-10-13 NOTE — Telephone Encounter (Signed)
Thurman Coyer, have you seen this form?

## 2015-10-13 NOTE — Telephone Encounter (Signed)
LMOVM with contact number will leave at fornt

## 2015-12-15 ENCOUNTER — Encounter: Payer: Self-pay | Admitting: Gastroenterology

## 2016-01-05 DIAGNOSIS — Z0000 Encounter for general adult medical examination without abnormal findings: Secondary | ICD-10-CM | POA: Diagnosis not present

## 2016-01-05 DIAGNOSIS — I10 Essential (primary) hypertension: Secondary | ICD-10-CM | POA: Diagnosis not present

## 2016-01-31 ENCOUNTER — Encounter: Payer: Self-pay | Admitting: Internal Medicine

## 2016-01-31 ENCOUNTER — Ambulatory Visit (INDEPENDENT_AMBULATORY_CARE_PROVIDER_SITE_OTHER): Payer: Medicare HMO | Admitting: Internal Medicine

## 2016-01-31 ENCOUNTER — Other Ambulatory Visit (INDEPENDENT_AMBULATORY_CARE_PROVIDER_SITE_OTHER): Payer: Medicare HMO

## 2016-01-31 VITALS — BP 130/84 | HR 66 | Temp 98.3°F | Resp 16 | Ht 71.0 in | Wt 188.8 lb

## 2016-01-31 DIAGNOSIS — I10 Essential (primary) hypertension: Secondary | ICD-10-CM

## 2016-01-31 DIAGNOSIS — E785 Hyperlipidemia, unspecified: Secondary | ICD-10-CM

## 2016-01-31 DIAGNOSIS — M5416 Radiculopathy, lumbar region: Secondary | ICD-10-CM | POA: Diagnosis not present

## 2016-01-31 DIAGNOSIS — G8929 Other chronic pain: Secondary | ICD-10-CM

## 2016-01-31 DIAGNOSIS — M25551 Pain in right hip: Secondary | ICD-10-CM

## 2016-01-31 DIAGNOSIS — R739 Hyperglycemia, unspecified: Secondary | ICD-10-CM

## 2016-01-31 DIAGNOSIS — M5431 Sciatica, right side: Secondary | ICD-10-CM | POA: Diagnosis not present

## 2016-01-31 LAB — LIPID PANEL
CHOL/HDL RATIO: 3
Cholesterol: 159 mg/dL (ref 0–200)
HDL: 46.7 mg/dL (ref >39.00)
LDL CALC: 90 mg/dL (ref 0–99)
NONHDL: 112.3
Triglycerides: 110 mg/dL (ref 0.0–149.0)
VLDL: 22 mg/dL (ref 0.0–40.0)

## 2016-01-31 LAB — CBC WITH DIFFERENTIAL/PLATELET
BASOS PCT: 0.5 % (ref 0.0–3.0)
Basophils Absolute: 0 10*3/uL (ref 0.0–0.1)
EOS PCT: 1.4 % (ref 0.0–5.0)
Eosinophils Absolute: 0.1 10*3/uL (ref 0.0–0.7)
HCT: 33.3 % — ABNORMAL LOW (ref 36.0–46.0)
HEMOGLOBIN: 10.8 g/dL — AB (ref 12.0–15.0)
LYMPHS ABS: 3 10*3/uL (ref 0.7–4.0)
Lymphocytes Relative: 47 % — ABNORMAL HIGH (ref 12.0–46.0)
MCHC: 32.3 g/dL (ref 30.0–36.0)
MCV: 80.3 fl (ref 78.0–100.0)
MONOS PCT: 9.3 % (ref 3.0–12.0)
Monocytes Absolute: 0.6 10*3/uL (ref 0.1–1.0)
NEUTROS PCT: 41.8 % — AB (ref 43.0–77.0)
Neutro Abs: 2.7 10*3/uL (ref 1.4–7.7)
Platelets: 261 10*3/uL (ref 150.0–400.0)
RBC: 4.15 Mil/uL (ref 3.87–5.11)
RDW: 13.8 % (ref 11.5–15.5)
WBC: 6.5 10*3/uL (ref 4.0–10.5)

## 2016-01-31 LAB — COMPREHENSIVE METABOLIC PANEL
ALK PHOS: 66 U/L (ref 39–117)
ALT: 12 U/L (ref 0–35)
AST: 16 U/L (ref 0–37)
Albumin: 3.9 g/dL (ref 3.5–5.2)
BUN: 12 mg/dL (ref 6–23)
CHLORIDE: 106 meq/L (ref 96–112)
CO2: 28 mEq/L (ref 19–32)
Calcium: 9.8 mg/dL (ref 8.4–10.5)
Creatinine, Ser: 0.77 mg/dL (ref 0.40–1.20)
GFR: 93.31 mL/min (ref >60.00)
GLUCOSE: 114 mg/dL — AB (ref 70–99)
POTASSIUM: 4.3 meq/L (ref 3.5–5.1)
Sodium: 141 mEq/L (ref 135–145)
TOTAL PROTEIN: 7.1 g/dL (ref 6.0–8.3)
Total Bilirubin: 0.5 mg/dL (ref 0.2–1.2)

## 2016-01-31 LAB — HEMOGLOBIN A1C: Hgb A1c MFr Bld: 5.9 % (ref 4.6–6.5)

## 2016-01-31 LAB — TSH: TSH: 3.12 u[IU]/mL (ref 0.35–4.50)

## 2016-01-31 NOTE — Patient Instructions (Signed)

## 2016-01-31 NOTE — Progress Notes (Signed)
Pre visit review using our clinic review tool, if applicable. No additional management support is needed unless otherwise documented below in the visit note. 

## 2016-02-03 NOTE — Progress Notes (Signed)
Subjective:  Patient ID: Peggye Fothergill, female    DOB: Jun 09, 1940  Age: 75 y.o. MRN: [id number]  CC: Back Pain   HPI BRYANAH NURMI presents for worsening pain and right lower back and right hip. She describes a stabbing and burning sensation throughout the entire area that radiates into her right thigh. The pain is exacerbated by sitting for long periods of time. She prefers not to take anything for the pain and is not willing to do physical therapy. Earlier this year she had some plain films done that showed degenerative joint disease. She denies new paresthesias in her lower extremities.  Outpatient Medications Prior to Visit  Medication Sig Dispense Refill  . amLODipine-valsartan (EXFORGE) 5-160 MG tablet Take 1 tablet by mouth daily. 90 tablet 3  . aspirin 81 MG tablet Take 81 mg by mouth daily.      . Calcium Carbonate-Vitamin D (CALCIUM 600 + D PO) Take by mouth daily.      . carvedilol (COREG) 6.25 MG tablet Take 1 tablet (6.25 mg total) by mouth 2 (two) times daily with a meal. 180 tablet 1  . Cholecalciferol 2000 UNITS TABS Take 1 tablet (2,000 Units total) by mouth daily. 90 tablet 3  . cholestyramine (QUESTRAN) 4 G packet DISSOLVE 1 PACKET INTO 2 TO 6 OUNCES OF WATER AND DRINK 2 TIMES A DAY. 60 packet 11  . cyanocobalamin 2000 MCG tablet Take 1 tablet (2,000 mcg total) by mouth daily. 90 tablet 3  . hydrochlorothiazide (HYDRODIURIL) 25 MG tablet TAKE 1 TABLET BY MOUTH DAILY FOR BLOOD PRESSURE AND SWELLING. 90 tablet 3  . Multiple Vitamins-Minerals (CENTRUM SILVER PO) Take by mouth daily.      . Niacin CR 250 MG TBCR Take 1 tablet (250 mg total) by mouth daily. 90 tablet 3  . simvastatin (ZOCOR) 20 MG tablet Take 1 tablet (20 mg total) by mouth at bedtime. 90 tablet 3  . valsartan (DIOVAN) 160 MG tablet Take 1 tablet (160 mg total) by mouth daily. 90 tablet 3  . tolterodine (DETROL LA) 4 MG 24 hr capsule Take 1 capsule (4 mg total) by mouth daily. (Patient not taking: Reported on  01/31/2016) 90 capsule 3  . ranitidine (ZANTAC) 300 MG tablet Take 1 tablet (300 mg total) by mouth at bedtime. (Patient not taking: Reported on 01/31/2016) 90 tablet 3   No facility-administered medications prior to visit.     ROS Review of Systems  Constitutional: Negative for activity change, diaphoresis, fatigue and unexpected weight change.  HENT: Negative.   Eyes: Negative.  Negative for visual disturbance.  Respiratory: Negative.  Negative for cough, choking, shortness of breath and stridor.   Cardiovascular: Negative for chest pain, palpitations and leg swelling.  Gastrointestinal: Negative.  Negative for abdominal pain, constipation, diarrhea, nausea and vomiting.  Endocrine: Negative.   Genitourinary: Negative.  Negative for difficulty urinating.  Musculoskeletal: Positive for arthralgias, back pain and gait problem. Negative for joint swelling, myalgias and neck pain.  Skin: Negative.  Negative for color change, pallor and rash.  Allergic/Immunologic: Negative.   Neurological: Positive for weakness and numbness. Negative for dizziness.       She has chronic weakness and incoordination in her left lower extremity status post a CVA  Hematological: Negative.  Negative for adenopathy. Does not bruise/bleed easily.  Psychiatric/Behavioral: Negative.     Objective:  BP 130/84 (BP Location: Right Arm, Patient Position: Sitting, Cuff Size: Normal)   Pulse 66   Temp 98.3 F (36.8  C) (Oral)   Ht 5\' 11"  (1.803 m)   Wt 188 lb 12 oz (85.6 kg)   SpO2 94%   BMI 26.33 kg/m   BP Readings from Last 3 Encounters:  01/31/16 130/84  06/08/15 (!) 144/78  02/23/15 (!) 142/88    Wt Readings from Last 3 Encounters:  01/31/16 188 lb 12 oz (85.6 kg)  06/08/15 194 lb (88 kg)  03/26/15 195 lb (88.5 kg)    Physical Exam  Constitutional: She is oriented to person, place, and time. No distress.  HENT:  Mouth/Throat: Oropharynx is clear and moist. No oropharyngeal exudate.  Eyes:  Conjunctivae are normal. Right eye exhibits no discharge. Left eye exhibits no discharge. No scleral icterus.  Neck: Normal range of motion. Neck supple. No JVD present. No tracheal deviation present. No thyromegaly present.  Cardiovascular: Normal rate, regular rhythm, normal heart sounds and intact distal pulses.  Exam reveals no gallop and no friction rub.   No murmur heard. Pulmonary/Chest: Effort normal and breath sounds normal. No stridor. No respiratory distress. She has no wheezes. She has no rales. She exhibits no tenderness.  Abdominal: Soft. Bowel sounds are normal. She exhibits no distension and no mass. There is no tenderness. There is no rebound and no guarding.  Musculoskeletal: She exhibits no edema, tenderness or deformity.       Right hip: She exhibits decreased range of motion and bony tenderness (over the GT). She exhibits normal strength, no tenderness, no swelling and no deformity.       Lumbar back: She exhibits decreased range of motion. She exhibits no tenderness, no bony tenderness, no swelling, no edema, no deformity and no spasm.  Lymphadenopathy:    She has no cervical adenopathy.  Neurological: She is alert and oriented to person, place, and time. She has normal strength. She displays atrophy and abnormal reflex. No cranial nerve deficit or sensory deficit. She exhibits abnormal muscle tone. She displays a negative Romberg sign. Coordination and gait abnormal.  Reflex Scores:      Tricep reflexes are 1+ on the right side and 1+ on the left side.      Bicep reflexes are 1+ on the right side and 1+ on the left side.      Brachioradialis reflexes are 1+ on the right side and 1+ on the left side.      Patellar reflexes are 0 on the right side and 2+ on the left side.      Achilles reflexes are 0 on the right side and 2+ on the left side. + SLR in RLE - SLR in LLE  She has chronic neurological changes in her left lower extremity with incoordination, atrophy, and  decreased muscle strength. This is not a new finding for her.  Skin: Skin is warm and dry. No rash noted. She is not diaphoretic. No erythema. No pallor.  Vitals reviewed.   Lab Results  Component Value Date   WBC 6.5 01/31/2016   HGB 10.8 (L) 01/31/2016   HCT 33.3 (L) 01/31/2016   PLT 261.0 01/31/2016   GLUCOSE 114 (H) 01/31/2016   CHOL 159 01/31/2016   TRIG 110.0 01/31/2016   HDL 46.70 01/31/2016   LDLCALC 90 01/31/2016   ALT 12 01/31/2016   AST 16 01/31/2016   NA 141 01/31/2016   K 4.3 01/31/2016   CL 106 01/31/2016   CREATININE 0.77 01/31/2016   BUN 12 01/31/2016   CO2 28 01/31/2016   TSH 3.12 01/31/2016   HGBA1C 5.9  01/31/2016    Dg Hips Bilat With Pelvis Min 5 Views  Result Date: 06/08/2015 CLINICAL DATA:  Bilateral hip pain for 2 months, right worse than left, no injury EXAM: DG HIP (WITH OR WITHOUT PELVIS) 5+V BILAT COMPARISON:  None. FINDINGS: There is moderate degenerative joint disease of both hips, right slightly greater than left with loss of joint space, sclerosis, and spurring present. There may be faint chondrocalcinosis present. Is there any history of CPPD arthropathy? The pelvic rami are intact. The SI joints appear corticated. There are degenerative changes in the lower lumbar spine. IMPRESSION: Moderate degenerative joint disease of both hips. No acute fracture. Question possible CPPD arthropathy. Electronically Signed   By: Ivar Drape M.D.   On: 06/08/2015 15:21    Assessment & Plan:   Shiraz was seen today for back pain.  Diagnoses and all orders for this visit:  Right lumbar radiculitis- an MRI is ordered to screen for spinal stenosis, nerve impingement, tumor, mass, disc herniation -     MR Lumbar Spine Wo Contrast; Future -     Ambulatory referral to Physical Therapy  Chronic right hip pain- MRI is ordered to see how severe the osteoarthritis is and to screen for bone spurs and other complications. -     MR Hip Right Wo Contrast; Future -      Ambulatory referral to Physical Therapy  Sciatica of right side- MRI is ordered to look for impingement in the right sciatic nerve, bone spur, compromise. -     MR Lumbar Spine Wo Contrast; Future -     MR Hip Right Wo Contrast; Future -     Ambulatory referral to Physical Therapy  Essential hypertension- her blood pressure is well controlled, lites and renal function are stable. -     CBC with Differential/Platelet; Future  Hyperlipidemia with target LDL less than 100- she has achieved her LDL goal is doing well on the statin. -     Lipid panel; Future -     TSH; Future  Hyperglycemia- improvement noted -     Comprehensive metabolic panel; Future -     Hemoglobin A1c; Future   I have discontinued Ms. Zaun's ranitidine. I am also having her maintain her aspirin, Calcium Carbonate-Vitamin D (CALCIUM 600 + D PO), Multiple Vitamins-Minerals (CENTRUM SILVER PO), valsartan, Cholecalciferol, cholestyramine, hydrochlorothiazide, amLODipine-valsartan, tolterodine, carvedilol, cyanocobalamin, simvastatin, and Niacin CR.  No orders of the defined types were placed in this encounter.    Follow-up: Return in about 6 weeks (around 03/13/2016).  Scarlette Calico, MD

## 2016-02-07 ENCOUNTER — Telehealth: Payer: Self-pay | Admitting: Internal Medicine

## 2016-02-07 NOTE — Telephone Encounter (Signed)
Informed pt dtr that both MRI's are not covered. Lumbar spine was covered. Dtr wanted to know if both were necessary. I explained that in order to view all parts of the area of pain they would need all. But if insurance is only paying for one then we would wait for results and go from there.

## 2016-02-07 NOTE — Telephone Encounter (Signed)
Tonya [name] is calling to ask if 2 views on the MRI are necessary. Because of the out of pocket cost, she is wondering if 1 is enough. Please call her to advise

## 2016-02-10 ENCOUNTER — Other Ambulatory Visit: Payer: Medicare HMO

## 2016-02-10 ENCOUNTER — Ambulatory Visit
Admission: RE | Admit: 2016-02-10 | Discharge: 2016-02-10 | Disposition: A | Discharge status: Home / Self Care | Payer: Medicare HMO | Source: Ambulatory Visit | Attending: Internal Medicine | Admitting: Internal Medicine

## 2016-02-10 DIAGNOSIS — M5431 Sciatica, right side: Secondary | ICD-10-CM

## 2016-02-10 DIAGNOSIS — M5416 Radiculopathy, lumbar region: Secondary | ICD-10-CM

## 2016-02-10 DIAGNOSIS — M48061 Spinal stenosis, lumbar region without neurogenic claudication: Secondary | ICD-10-CM | POA: Diagnosis not present

## 2016-02-14 ENCOUNTER — Ambulatory Visit (HOSPITAL_COMMUNITY): Payer: Medicare HMO | Attending: Internal Medicine | Admitting: Physical Therapy

## 2016-02-14 DIAGNOSIS — R262 Difficulty in walking, not elsewhere classified: Secondary | ICD-10-CM

## 2016-02-14 DIAGNOSIS — M5441 Lumbago with sciatica, right side: Secondary | ICD-10-CM | POA: Diagnosis not present

## 2016-02-14 DIAGNOSIS — R29898 Other symptoms and signs involving the musculoskeletal system: Secondary | ICD-10-CM | POA: Diagnosis not present

## 2016-02-14 DIAGNOSIS — M6281 Muscle weakness (generalized): Secondary | ICD-10-CM

## 2016-02-14 DIAGNOSIS — G8929 Other chronic pain: Secondary | ICD-10-CM | POA: Diagnosis not present

## 2016-02-14 NOTE — Therapy (Signed)
[place], [place], [address] Phone: [phone]   Fax:  [phone]  Physical Therapy Evaluation  Patient Details  Name: Tonya [name] MRN: [id number] Date of Birth: May 20, 1940 Referring Provider: Janith Lima   Encounter Date: 02/14/2016      PT End of Session - 02/14/16 1655    Visit Number 1   Number of Visits 4   Date for PT Re-Evaluation 03/27/16   Authorization Type [employer] H[employer]    Authorization Time Period 02/14/16 to 03/27/16   Authorization - Visit Number 1   Authorization - Number of Visits 10   PT Start Time [id number]   PT Stop Time 1431   PT Time Calculation (min) 44 min   Activity Tolerance Patient tolerated treatment well   Behavior During Therapy [facility] for tasks assessed/performed      Past Medical History:  Diagnosis Date  . Cerebrovascular accident ([place])   . Chronic edema    ? venous insufficiency  . COPD (chronic obstructive pulmonary disease) ([place])    never had a problem breathing  . Enlarged heart    per pt  . Gallstones   . GERD (gastroesophageal reflux disease)   . Hyperlipidemia   . Hypertension     Past Surgical History:  Procedure Laterality Date  . ABDOMINAL HYSTERECTOMY    . CHOLECYSTECTOMY      There were no vitals filed for this visit.       Subjective Assessment - 02/14/16 1353    Subjective Patient arrives stating that she has had a burning in her R LE, which is her primary complaint, start about 3 months ago; she thinks that maybe it came from her riding her bike and it has just continued to progress. Her pain is mostly in her proximal hamstrings but it does not stop her from doing anything, it is mostly just annoying. About 50 years ago, when she was pregnant, she fell due to her legs falling out from under her but she cannot remember exactly how it was resolved. She does have some numbness in her upper thigh. No changes with bowel or bladder. No falls  recently, or close calls.    Pertinent History beta-blockers, HTN, COPD, old CVA (2001), bradycardia    How long can you sit comfortably? unlimited    How long can you stand comfortably? unlimited    How long can you walk comfortably? does not walk much secondary to chronic stroke    Patient Stated Goals "I'm only here because the doctor told me to come, I don't know what you can do for me"    Currently in Pain? No/denies            [facility] PT Assessment - 02/14/16 0001      Assessment   Medical Diagnosis R radiuclitis/chronic hip pain/Rsciatica    Referring Provider Janith Lima    Onset Date/Surgical Date --  three months ago    Next MD Visit Dr. Ronnald Ramp soon (not sure exactly when)   Prior Therapy none      Balance Screen   Has the patient fallen in the past 6 months No   Has the patient had a decrease in activity level because of a fear of falling?  Yes   Is the patient reluctant to leave their home because of a fear of falling?  No     Prior Function   Level of Independence Independent;Independent with basic ADLs;Independent with  gait;Independent with transfers   Vocation Full time employment   Vocation Requirements works as a Scientist, forensic Assessments   Observations (-) SLR/[employer]      AROM   Lumbar Flexion moderate limitation   no change in symptoms with repeated motions    Lumbar Extension moderate limitation   reduction in intensity of symptoms with repeated motions    Lumbar - Right Side Bend fingertips to midline of knee    Lumbar - Left Side Bend fingertips to midline of knee    Lumbar - Right Rotation moderate limitation    Lumbar - Left Rotation moderate limitation      Strength   Right Hip Flexion 3/5   Right Hip ABduction 2/5   Left Hip Flexion 2/5   Left Hip ABduction 2/5   Right Knee Flexion 5/5   Right Knee Extension 5/5   Left Knee Flexion 2+/5  chronic stroke    Left Knee Extension 3-/5  chronic stroke    Right Ankle  Dorsiflexion 5/5   Left Ankle Dorsiflexion 2/5  chronic CVA      Flexibility   Hamstrings moderate-severe impairment    Piriformis moderate-severe impairment                            PT Education - 02/14/16 1654    Education provided Yes   Education Details prognosis, possible effects of chronic stroke on symptoms, relation of low back pathology to symptoms, POC, HEP    Person(s) Educated Patient;Child(ren)   Methods Explanation;Demonstration;Handout   Comprehension Verbalized understanding;Returned demonstration;Need further instruction          PT Short Term Goals - 02/14/16 1700      PT SHORT TERM GOAL #1   Title Patient to experience at least a 50% reduction in the frequency and duration of her symptoms in order to show general improvement of condition    Time 3   Period Weeks   Status New     PT SHORT TERM GOAL #2   Title Patient to experience at least a 50% reduction in intensity of symptoms in order to show general improvement of condition    Time 3   Period Weeks   Status New     PT SHORT TERM GOAL #3   Title Patient to be independent in correctly and consistently performing simple HEP, to be updated PRN    Time 1   Period Weeks   Status New           PT Long Term Goals - 02/14/16 1701      PT LONG TERM GOAL #1   Title Patient to show only mild limitation in lumbar ROM on all tested planes with no pain exacerbation in order to improve mobility and QOL    Time 6   Period Weeks   Status New     PT LONG TERM GOAL #2   Title Patient to have improved strength in all tested muscle groups by at least one grade in order to assist in maintaining symptom reduction and to prevent symptom exacerbation    Time 6   Period Weeks   Status New     PT LONG TERM GOAL #3   Title Patient to be independent in correctly and consistently performing advanced HEP to show good self-efficacy in managing condition    Time 6   Period Weeks   Status New  Plan - 02/14/16 1656    Clinical Impression Statement Patient arrives stating she has had R leg burning symptoms for about 3 months now; she originally thought this was due to the how the seat was on her bicycle however she has changed this up and it has not made a difference. Upon examination, patient reveals severe postural and gait deficits that are likely related to chronic effects of her past CVA, increased dependence on stronger R UE/LE in general, and significant functional weakness/ROM deficits that are likely related to chronic effects of her CVA as well. She does appear to respond well to repeated lumbar extension as this eliminated her pain in standing today; note that extent of skilled PT will be limited by financial considerations as patient is on a tight budget and is being placed on an independent HEP program with occasional check-ins with DPT to compensate for financial concerns. Suspect that some of her symptoms/pain are likely related to chronic compensations secondary to old CVA. She will, however still likely benefit from working with skilled PT services to attempt to address her functional limitations, reduce symptoms, and assist in reaching optimal level of function.    Rehab Potential Fair   Clinical Impairments Affecting Rehab Potential effects of chronic CVA/chronicity of compensations, significant financial concerns limiting extent of skilled PT services    PT Frequency Other (comment)  1 time every 3 weeks at least 2-3 times    PT Duration Other (comment)  1 time every 3 weeks at least 2-3 times    PT Treatment/Interventions ADLs/Self Care Home Management;Biofeedback;Cryotherapy;Moist Heat;DME Instruction;Gait training;Stair training;Functional mobility training;Therapeutic activities;Therapeutic exercise;Balance training;Neuromuscular re-education;Patient/family education;Manual techniques;Passive range of motion;Energy conservation;Taping   PT Next Visit  Plan review HEP and goals; assess response to HEP and modify/expand if appropriate    PT Home Exercise Plan 10/23: seated clams with yellow TB, standing lumbar extensions, sit to stand with lumbar extension at the end    Consulted and Agree with Plan of Care Patient      Patient will benefit from skilled therapeutic intervention in order to improve the following deficits and impairments:  Abnormal gait, Improper body mechanics, Pain, Decreased mobility, Increased muscle spasms, Postural dysfunction, Impaired tone, Decreased activity tolerance, Decreased range of motion, Decreased strength, Hypomobility, Decreased balance, Difficulty walking, Impaired flexibility  Visit Diagnosis: Chronic right-sided low back pain with right-sided sciatica - Plan: PT plan of care cert/re-cert  Muscle weakness (generalized) - Plan: PT plan of care cert/re-cert  Difficulty in walking, not elsewhere classified - Plan: PT plan of care cert/re-cert  Other symptoms and signs involving the musculoskeletal system - Plan: PT plan of care cert/re-cert      G-Codes - [id number]    Functional Assessment Tool Used Based on skilled clinical assessment of posture, pain patterns, ROM, strength, effect of chronic CVA impairments on current symptoms    Functional Limitation Mobility: Walking and moving around   Mobility: Walking and Moving Around Current Status [id number]) At least 40 percent but less than 60 percent impaired, limited or restricted   Mobility: Walking and Moving Around Goal Status [phone]) At least 20 percent but less than 40 percent impaired, limited or restricted       Problem List Patient Active Problem List   Diagnosis Date Noted  . Sciatica of right side 01/31/2016  . Chronic right hip pain 06/08/2015  . Nonspecific abnormal electrocardiogram (ECG) (EKG) 02/23/2015  . Bradycardia 10/20/2014  . Routine general medical examination at a health care facility 01/02/2013  .  Hyperglycemia 07/18/2011  .  Hypertonicity of bladder 12/20/2009  . Hyperlipidemia with target LDL less than 100 12/14/2008  . GERD 11/03/2008  . HEART MURMUR, SYSTOLIC 0000000  . Right lumbar radiculitis 05/18/2008  . Essential hypertension 07/26/2007  . COPD 07/26/2007  . CVA, old, hemiparesis ([place]) 07/26/2007    Deniece Ree PT, DPT [facility] [address] [place], [place], [address] Phone: [phone]   Fax:  [phone]  Name: Tonya [name] MRN: [id number] Date of Birth: Apr 14, 1941

## 2016-02-14 NOTE — Patient Instructions (Signed)
   Back Extension  Standing lumbar extension  Hands on the hips, keeping your gaze forward, gently press your hips forward to tolerance and return to neutral spinal position.  *Perform with wall behind you for support  Perform 10 times twice a day, AND when your symptoms flare up.     SIT STAND - ONE HAND ASSIST  While seated in a chair, scoot forward towards the edge of the chair. Next, hold on to the arm rest with one hand for support and then raise up to standing, and finally extend your back.  Repeat 10 times, twice a day.    ELASTIC BAND - SEATED CLAMS  While sitting in a chair and an elastic band wrapped around your knees, move both knees to the sides to separate your legs. Keep contact of your feet on the floor the entire time.  You may need to stabilize one leg with your hands and exercise the other leg, then switch sides.   Repeat 10 times each side with the yellow band, twice a day.

## 2016-03-06 ENCOUNTER — Telehealth (HOSPITAL_COMMUNITY): Payer: Self-pay | Admitting: Internal Medicine

## 2016-03-06 NOTE — Telephone Encounter (Signed)
03/06/16 daughter called to cx and since it was the last appt on the schedule I asked if they wanted to reschedule and the patient did not.... She said to go ahead and discharge her

## 2016-03-07 ENCOUNTER — Ambulatory Visit (HOSPITAL_COMMUNITY): Payer: Medicare HMO | Admitting: Physical Therapy

## 2016-03-13 ENCOUNTER — Telehealth: Payer: Self-pay | Admitting: Internal Medicine

## 2016-03-13 NOTE — Telephone Encounter (Signed)
Patient is needing a prescription for pads for her leg brace  (LC3 Bionic Brace)

## 2016-03-14 NOTE — Telephone Encounter (Signed)
#  [phone] Option 3  Spoke to representative and they are refaxing to [facility].

## 2016-03-14 NOTE — Telephone Encounter (Signed)
Received form

## 2016-03-15 NOTE — Telephone Encounter (Signed)
Pt informed of same.  

## 2016-03-15 NOTE — Telephone Encounter (Signed)
Form signed by pcp and faxed to [place]. Form sent to scan.

## 2016-03-25 ENCOUNTER — Other Ambulatory Visit: Payer: Self-pay | Admitting: Internal Medicine

## 2016-05-17 ENCOUNTER — Other Ambulatory Visit (INDEPENDENT_AMBULATORY_CARE_PROVIDER_SITE_OTHER): Payer: Medicare HMO

## 2016-05-17 ENCOUNTER — Encounter: Payer: Self-pay | Admitting: Internal Medicine

## 2016-05-17 ENCOUNTER — Telehealth: Payer: Self-pay

## 2016-05-17 ENCOUNTER — Ambulatory Visit (INDEPENDENT_AMBULATORY_CARE_PROVIDER_SITE_OTHER): Payer: Medicare HMO | Admitting: Internal Medicine

## 2016-05-17 VITALS — BP 130/70 | HR 62 | Temp 98.2°F | Resp 16 | Ht 71.5 in | Wt 192.4 lb

## 2016-05-17 DIAGNOSIS — D539 Nutritional anemia, unspecified: Secondary | ICD-10-CM

## 2016-05-17 DIAGNOSIS — I10 Essential (primary) hypertension: Secondary | ICD-10-CM

## 2016-05-17 DIAGNOSIS — D508 Other iron deficiency anemias: Secondary | ICD-10-CM

## 2016-05-17 DIAGNOSIS — D509 Iron deficiency anemia, unspecified: Secondary | ICD-10-CM

## 2016-05-17 DIAGNOSIS — D500 Iron deficiency anemia secondary to blood loss (chronic): Secondary | ICD-10-CM | POA: Insufficient documentation

## 2016-05-17 LAB — BASIC METABOLIC PANEL
BUN: 8 mg/dL (ref 6–23)
CHLORIDE: 109 meq/L (ref 96–112)
CO2: 27 meq/L (ref 19–32)
Calcium: 9.6 mg/dL (ref 8.4–10.5)
Creatinine, Ser: 0.73 mg/dL (ref 0.40–1.20)
GFR: 99.16 mL/min (ref >60.00)
GLUCOSE: 127 mg/dL — AB (ref 70–99)
POTASSIUM: 4.8 meq/L (ref 3.5–5.1)
Sodium: 141 mEq/L (ref 135–145)

## 2016-05-17 LAB — CBC WITH DIFFERENTIAL/PLATELET
BASOS PCT: 0.3 % (ref 0.0–3.0)
Basophils Absolute: 0 10*3/uL (ref 0.0–0.1)
EOS PCT: 1.9 % (ref 0.0–5.0)
Eosinophils Absolute: 0.1 10*3/uL (ref 0.0–0.7)
HCT: 32.5 % — ABNORMAL LOW (ref 36.0–46.0)
Hemoglobin: 10.3 g/dL — ABNORMAL LOW (ref 12.0–15.0)
LYMPHS ABS: 2 10*3/uL (ref 0.7–4.0)
Lymphocytes Relative: 34.5 % (ref 12.0–46.0)
MCHC: 31.6 g/dL (ref 30.0–36.0)
MCV: 75.6 fl — AB (ref 78.0–100.0)
MONO ABS: 0.5 10*3/uL (ref 0.1–1.0)
Monocytes Relative: 8.1 % (ref 3.0–12.0)
NEUTROS ABS: 3.2 10*3/uL (ref 1.4–7.7)
NEUTROS PCT: 55.2 % (ref 43.0–77.0)
PLATELETS: 266 10*3/uL (ref 150.0–400.0)
RBC: 4.31 Mil/uL (ref 3.87–5.11)
RDW: 15.5 % (ref 11.5–15.5)
WBC: 5.8 10*3/uL (ref 4.0–10.5)

## 2016-05-17 LAB — IBC PANEL
IRON: 22 ug/dL — AB (ref 42–145)
Saturation Ratios: 4.8 % — ABNORMAL LOW (ref 20.0–50.0)
Transferrin: 329 mg/dL (ref 212.0–360.0)

## 2016-05-17 LAB — VITAMIN B12: Vitamin B-12: 1054 pg/mL — ABNORMAL HIGH (ref 211–911)

## 2016-05-17 LAB — FERRITIN: FERRITIN: 6.7 ng/mL — AB (ref 10.0–291.0)

## 2016-05-17 LAB — RETICULOCYTES
ABS Retic: 62400 cells/uL (ref 20000–80000)
RBC.: 4.16 MIL/uL (ref 3.80–5.10)
Retic Ct Pct: 1.5 %

## 2016-05-17 LAB — FOLATE: Folate: 18.5 ng/mL (ref >5.9)

## 2016-05-17 MED ORDER — FERROUS SULFATE 325 (65 FE) MG PO TABS: 325.0000 mg | ORAL_TABLET | Freq: Three times a day (TID) | ORAL | 5 refills | Status: DC

## 2016-05-17 NOTE — Telephone Encounter (Signed)
Advised patient of dr Ronnald Ramp note/instructions, she will start rx and wait for gi to call to schedule appt, patient repeated back for understanding

## 2016-05-17 NOTE — Telephone Encounter (Signed)
-----   Message from Janith Lima, MD sent at 05/17/2016  2:46 PM EST ----- pls let her know that her anemia has worsened some and her iron level is low I have sent an iron supplement to her pharmacy I will send a referral to GI to see if she needs to undergo tests for look for sources of blood loss

## 2016-05-17 NOTE — Progress Notes (Signed)
Subjective:  Patient ID: Tonya [name], female    DOB: 19-Nov-1940  Age: 76 y.o. MRN: [id number]  CC: Anemia and Hypertension   HPI [name] presents for Follow-up. She complains of worsening shortness of breath and fatigue. She had a normal Lexiscan done about a year and a half ago. She is aware that she has anemia but is not taking any vitamin supplements. She is not aware of any sources of blood loss and denies abdominal pain, nausea, vomiting, loss of appetite, melena, or blood in her stools. She also denies paresthesias.  Outpatient Medications Prior to Visit  Medication Sig Dispense Refill  . amLODipine-valsartan (EXFORGE) 5-160 MG tablet TAKE 1 TABLET DAILY 90 tablet 3  . aspirin 81 MG tablet Take 81 mg by mouth daily.      . Calcium Carbonate-Vitamin D (CALCIUM 600 + D PO) Take by mouth daily.      . carvedilol (COREG) 6.25 MG tablet Take 1 tablet (6.25 mg total) by mouth 2 (two) times daily with a meal. 180 tablet 1  . Cholecalciferol 2000 UNITS TABS Take 1 tablet (2,000 Units total) by mouth daily. 90 tablet 3  . cyanocobalamin 2000 MCG tablet Take 1 tablet (2,000 mcg total) by mouth daily. 90 tablet 3  . hydrochlorothiazide (HYDRODIURIL) 25 MG tablet TAKE 1 TABLET DAILY FOR    BLOOD PRESSURE AND SWELLING 90 tablet 3  . Niacin CR 250 MG TBCR Take 1 tablet (250 mg total) by mouth daily. 90 tablet 3  . simvastatin (ZOCOR) 20 MG tablet Take 1 tablet (20 mg total) by mouth at bedtime. 90 tablet 3  . cholestyramine (QUESTRAN) 4 G packet DISSOLVE 1 PACKET INTO 2 TO 6 OUNCES OF WATER AND DRINK 2 TIMES A DAY. (Patient not taking: Reported on 05/17/2016) 60 packet 11  . tolterodine (DETROL LA) 4 MG 24 hr capsule Take 1 capsule (4 mg total) by mouth daily. (Patient not taking: Reported on 01/31/2016) 90 capsule 3  . Multiple Vitamins-Minerals (CENTRUM SILVER PO) Take by mouth daily.      . valsartan (DIOVAN) 160 MG tablet Take 1 tablet (160 mg total) by mouth daily. 90 tablet 3   No  facility-administered medications prior to visit.     ROS Review of Systems  Constitutional: Positive for fatigue. Negative for appetite change, chills and unexpected weight change.  HENT: Negative.  Negative for trouble swallowing.   Eyes: Negative for visual disturbance.  Respiratory: Positive for shortness of breath. Negative for choking, chest tightness and wheezing.   Cardiovascular: Negative for chest pain, palpitations and leg swelling.  Gastrointestinal: Negative for abdominal pain, blood in stool, constipation, diarrhea, nausea and vomiting.  Endocrine: Negative.  Negative for cold intolerance and heat intolerance.  Genitourinary: Negative for hematuria, menstrual problem and vaginal bleeding.  Musculoskeletal: Negative.  Negative for back pain and joint swelling.  Skin: Negative.  Negative for color change, pallor and rash.  Allergic/Immunologic: Negative.   Neurological: Negative.  Negative for dizziness, tremors, weakness, light-headedness, numbness and headaches.  Hematological: Negative for adenopathy. Does not bruise/bleed easily.  Psychiatric/Behavioral: Negative.     Objective:  BP 130/70   Pulse 62   Temp 98.2 F (36.8 C) (Oral)   Resp 16   Ht 5' 11.5" (1.816 m)   Wt 192 lb 6.4 oz (87.3 kg)   SpO2 94%   BMI 26.46 kg/m   BP Readings from Last 3 Encounters:  05/17/16 130/70  01/31/16 130/84  06/08/15 (!) 144/78  Wt Readings from Last 3 Encounters:  05/17/16 192 lb 6.4 oz (87.3 kg)  01/31/16 188 lb 12 oz (85.6 kg)  06/08/15 194 lb (88 kg)    Physical Exam  Constitutional: She is oriented to person, place, and time. No distress.  HENT:  Mouth/Throat: Oropharynx is clear and moist. No oropharyngeal exudate.  Eyes: Conjunctivae are normal. Right eye exhibits no discharge. Left eye exhibits no discharge. No scleral icterus.  Neck: Normal range of motion. Neck supple. No JVD present. No tracheal deviation present. No thyromegaly present.  Cardiovascular:  Normal rate, regular rhythm and intact distal pulses.  Exam reveals no gallop and no friction rub.   Murmur heard.  Systolic murmur is present with a grade of 2/6   No diastolic murmur is present  Pulmonary/Chest: Effort normal and breath sounds normal. No stridor. No respiratory distress. She has no wheezes. She has no rales. She exhibits no tenderness.  Abdominal: Soft. Bowel sounds are normal. She exhibits no distension and no mass. There is no tenderness. There is no rebound and no guarding.  Musculoskeletal: Normal range of motion. She exhibits no edema, tenderness or deformity.  Lymphadenopathy:    She has no cervical adenopathy.  Neurological: She is alert and oriented to person, place, and time.  Skin: Skin is warm and dry. No rash noted. She is not diaphoretic. No erythema. No pallor.  Psychiatric: She has a normal mood and affect. Her behavior is normal. Judgment and thought content normal.  Vitals reviewed.   Lab Results  Component Value Date   WBC 5.8 05/17/2016   HGB 10.3 (L) 05/17/2016   HCT 32.5 (L) 05/17/2016   PLT 266.0 05/17/2016   GLUCOSE 127 (H) 05/17/2016   CHOL 159 01/31/2016   TRIG 110.0 01/31/2016   HDL 46.70 01/31/2016   LDLCALC 90 01/31/2016   ALT 12 01/31/2016   AST 16 01/31/2016   NA 141 05/17/2016   K 4.8 05/17/2016   CL 109 05/17/2016   CREATININE 0.73 05/17/2016   BUN 8 05/17/2016   CO2 27 05/17/2016   TSH 3.12 01/31/2016   HGBA1C 5.9 01/31/2016    Mr Lumbar Spine Wo Contrast  Result Date: 02/10/2016 CLINICAL DATA:  76 year old female with right side lumbar pain, radiculitis, sciatica. Initial encounter. EXAM: MRI LUMBAR SPINE WITHOUT CONTRAST TECHNIQUE: Multiplanar, multisequence MR imaging of the lumbar spine was performed. No intravenous contrast was administered. COMPARISON:  Lumbar MRI 05/11/2008.  Lumbar radiographs 04/07/2008. FINDINGS: Segmentation: Normal as seen on the comparison radiographs. This is the same numbering system used on  the 2010 MRI. Alignment: Mild retrolisthesis of L5 on S1 is stable. Otherwise preserved lumbar lordosis. Vertebrae: Mild marrow edema at the endplates of 075-GRM, appears in part related to an inferior L4 endplate Schmorl node which is new since 2010 (series 6, image 7). No other No marrow edema or evidence of acute osseous abnormality. Visible sacrum is intact. Conus medullaris: Extends to the L2 level and appears normal. Paraspinal and other soft tissues: Visualized abdominal viscera and paraspinal soft tissues are within normal limits. Disc levels: T11-T12: Chronic mild circumferential disc bulge. Left paracentral component may be increased from the prior MRI (series 7, image 1) but there is no significant stenosis. T12-L1:  Mild facet hypertrophy. L1-L2:  Mild facet hypertrophy. L2-L3: Chronic circumferential disc bulge with bulky far lateral component. Chronic moderate facet and ligament flavum hypertrophy. This level appears stable with borderline to Mild spinal and bilateral L2 foraminal stenosis. L3-L4: Chronic left eccentric circumferential disc  bulge. Superimposed new left lateral recess small caudal disc extrusion (series 7, images 18 and 19). Moderate facet and ligament flavum hypertrophy appears stable. Increased moderate to severe left lateral recess stenosis with otherwise stable mild spinal stenosis. The left foraminal and far lateral component of the disc also appears increased, with increased mild to moderate left foraminal stenosis. Stable borderline to mild right foraminal stenosis. L4-L5: Chronic circumferential disc bulge with increased bulky broad-based posterior component since 2010. Moderate facet and ligament flavum hypertrophy. Progressed moderate to severe spinal stenosis (series 7, image 24) with left greater than right lateral recess stenosis. Endplate spurring. Moderate bilateral L4 foraminal stenosis, improved on the right owing to regressed right foraminal disc extrusion. L5-S1:  Chronic severe disc space loss and bulky circumferential disc osteophyte complex. Broad-based posterior component appears stable. Moderate facet hypertrophy is stable. Moderate to severe left greater than right lateral recess stenosis is stable. Moderate left and mild right L5 foraminal stenosis is stable. IMPRESSION: 1. Progressed chronic disc and posterior element degeneration at L4-L5 since 2010, now with moderate to severe spinal and left greater than right lateral recess stenosis. Moderate biforaminal stenosis, although improved on the right owing to interval regressed right foraminal disc herniation. 2. Progressed chronic disc degeneration at L3-L4 with increased moderate to severe left lateral recess stenosis, mild spinal stenosis, and mild to moderate left foraminal stenosis. 3. Stable advanced chronic disc and endplate degeneration at L5-S1 with moderate to severe left greater than right lateral recess and moderate left foraminal stenosis. 4. Mild endplate marrow edema at L4-L5, in part related to an L4 inferior endplate Schmorl node which is new since 2010. Electronically Signed   By: Genevie Ann M.D.   On: 02/10/2016 16:37    Assessment & Plan:   Shauna was seen today for anemia and hypertension.  Diagnoses and all orders for this visit:  Essential hypertension - her blood pressure is adequately well controlled, electrolytes and renal function are normal. -     Basic metabolic panel; Future  Deficiency anemia- she has a worsening anemia and iron deficiency, her other vitamin levels are all normal. -     CBC with Differential/Platelet; Future -     IBC panel; Future -     Reticulocytes; Future -     Vitamin B12; Future -     Ferritin; Future -     Folate; Future -     Vitamin B1; Future  Other iron deficiency anemia- I've asked her to start iron replacement therapy and to see gastroenterology to consider a GI workup to look for sources of blood loss. -     Discontinue: ferrous sulfate 325  (65 FE) MG tablet; Take 1 tablet (325 mg total) by mouth 3 (three) times daily with meals. -     Ambulatory referral to Gastroenterology -     ferrous sulfate 325 (65 FE) MG tablet; Take 1 tablet (325 mg total) by mouth 3 (three) times daily with meals.   I have discontinued Ms. Kem's Multiple Vitamins-Minerals (CENTRUM SILVER PO) and valsartan. I am also having her maintain her aspirin, Calcium Carbonate-Vitamin D (CALCIUM 600 + D PO), Cholecalciferol, cholestyramine, tolterodine, carvedilol, cyanocobalamin, simvastatin, Niacin CR, amLODipine-valsartan, hydrochlorothiazide, and ferrous sulfate.  Meds ordered this encounter  Medications  . DISCONTD: ferrous sulfate 325 (65 FE) MG tablet    Sig: Take 1 tablet (325 mg total) by mouth 3 (three) times daily with meals.    Dispense:  90 tablet    Refill:  5  . ferrous sulfate 325 (65 FE) MG tablet    Sig: Take 1 tablet (325 mg total) by mouth 3 (three) times daily with meals.    Dispense:  90 tablet    Refill:  5     Follow-up: Return in about 4 months (around 09/14/2016).  Scarlette Calico, MD

## 2016-05-17 NOTE — Patient Instructions (Signed)
Anemia, Nonspecific Anemia is a condition in which the concentration of red blood cells or hemoglobin in the blood is below normal. Hemoglobin is a substance in red blood cells that carries oxygen to the tissues of the body. Anemia results in not enough oxygen reaching these tissues. What are the causes? Common causes of anemia include:  Excessive bleeding. Bleeding may be internal or external. This includes excessive bleeding from periods (in women) or from the intestine.  Poor nutrition.  Chronic kidney, thyroid, and liver disease.  Bone marrow disorders that decrease red blood cell production.  Cancer and treatments for cancer.  HIV, AIDS, and their treatments.  Spleen problems that increase red blood cell destruction.  Blood disorders.  Excess destruction of red blood cells due to infection, medicines, and autoimmune disorders. What are the signs or symptoms?  Minor weakness.  Dizziness.  Headache.  Palpitations.  Shortness of breath, especially with exercise.  Paleness.  Cold sensitivity.  Indigestion.  Nausea.  Difficulty sleeping.  Difficulty concentrating. Symptoms may occur suddenly or they may develop slowly. How is this diagnosed? Additional blood tests are often needed. These help your health care provider determine the best treatment. Your health care provider will check your stool for blood and look for other causes of blood loss. How is this treated? Treatment varies depending on the cause of the anemia. Treatment can include:  Supplements of iron, vitamin B12, or folic acid.  Hormone medicines.  A blood transfusion. This may be needed if blood loss is severe.  Hospitalization. This may be needed if there is significant continual blood loss.  Dietary changes.  Spleen removal. Follow these instructions at home: Keep all follow-up appointments. It often takes many weeks to correct anemia, and having your health care provider check on your  condition and your response to treatment is very important. Get help right away if:  You develop extreme weakness, shortness of breath, or chest pain.  You become dizzy or have trouble concentrating.  You develop heavy vaginal bleeding.  You develop a rash.  You have bloody or black, tarry stools.  You faint.  You vomit up blood.  You vomit repeatedly.  You have abdominal pain.  You have a fever or persistent symptoms for more than 2-3 days.  You have a fever and your symptoms suddenly get worse.  You are dehydrated. This information is not intended to replace advice given to you by your health care provider. Make sure you discuss any questions you have with your health care provider. Document Released: 05/18/2004 Document Revised: 09/22/2015 Document Reviewed: 10/04/2012 Elsevier Interactive Patient Education  2017 [employer] Inc[employer]  

## 2016-05-17 NOTE — Progress Notes (Signed)
Pre visit review using our clinic review tool, if applicable. No additional management support is needed unless otherwise documented below in the visit note. 

## 2016-05-18 MED ORDER — FERROUS SULFATE 325 (65 FE) MG PO TABS: 325.0000 mg | ORAL_TABLET | Freq: Three times a day (TID) | ORAL | 5 refills | Status: DC

## 2016-05-21 LAB — VITAMIN B1: Vitamin B1 (Thiamine): 8 nmol/L (ref 8–30)

## 2016-05-22 ENCOUNTER — Encounter (HOSPITAL_COMMUNITY): Payer: Self-pay | Admitting: Physical Therapy

## 2016-05-22 NOTE — Therapy (Signed)
[facility] [place], [place], [address] Phone: [phone]   Fax:  [phone]  Patient Details  Name: Tonya [name] MRN: [id number] Date of Birth: 01-29-1941 Referring Provider:  No ref. provider found  Encounter Date: 05/22/2016   PHYSICAL THERAPY DISCHARGE SUMMARY  Visits from Start of Care: 1  Current functional level related to goals / functional outcomes: Patient has not returned since last scheduled session    Remaining deficits: Unable to assess    Education / Equipment: N/A  Plan: Patient agrees to discharge.  Patient goals were not met. Patient is being discharged due to not returning since the last visit.  ?????       Deniece Ree PT, DPT [facility] [address] [place], [place], [address] Phone: [phone]   Fax:  [phone]

## 2016-05-23 ENCOUNTER — Encounter: Payer: Self-pay | Admitting: Physician Assistant

## 2016-05-23 ENCOUNTER — Telehealth: Payer: Self-pay | Admitting: Internal Medicine

## 2016-05-23 NOTE — Telephone Encounter (Signed)
[facility]  Patient Name: WENONAH CRISSINGER  DOB: 23-Jul-1940    Initial Comment Caller states her mother Kamelah Zeilinger has weakness, dizziness, accelerated heart rate, and shortness of breath.   Nurse Assessment  Nurse: Wynetta Emery, RN, Baker Janus Date/Time Eilene Ghazi Time): 05/23/2016 10:09:55 AM  Confirm and document reason for call. If symptomatic, describe symptoms. ---Izell Caldwell dx with anemic; feels something else is going on with her; [name] feels something else is going on; dizziness, short of breath, and elevated heart rate and feeling weakness.  Does the patient have any new or worsening symptoms? ---Yes  Will a triage be completed? ---Yes  Related visit to physician within the last 2 weeks? ---No  Does the PT have any chronic conditions? (i.e. diabetes, asthma, etc.) ---No  Is this a behavioral health or substance abuse call? ---No     Guidelines    Guideline Title Affirmed Question Affirmed Notes  Heart Rate and Heartbeat Questions Dizziness, lightheadedness, or weakness    Final Disposition User   Go to ED Now Wynetta Emery, RN, Baker Janus    Comments  NOTE does not want to go to ED at this time wants to see her MD and availability the rest of week. Wants to be seen by her MD only. Has open on Monday at 1030am can someone book this time for her if Dr. Ronnald Ramp also has cancellation this week will take it please call and if he is running the [facility] this weekend let them know.   Referrals  [facility] - ED   Disagree/Comply: Comply

## 2016-05-26 NOTE — Telephone Encounter (Signed)
Pt stated that her symptoms have subsided. The only symptom that remains is the dizziness but states that it has gotten some what better.  Pt has started her iron supplement. Pt has appt with GI on 05/30/16.

## 2016-05-30 ENCOUNTER — Encounter: Payer: Self-pay | Admitting: Physician Assistant

## 2016-05-30 ENCOUNTER — Ambulatory Visit (INDEPENDENT_AMBULATORY_CARE_PROVIDER_SITE_OTHER): Payer: Medicare HMO | Admitting: Physician Assistant

## 2016-05-30 ENCOUNTER — Other Ambulatory Visit: Payer: Medicare HMO

## 2016-05-30 VITALS — BP 122/70 | HR 80 | Ht 71.0 in | Wt 195.6 lb

## 2016-05-30 DIAGNOSIS — D509 Iron deficiency anemia, unspecified: Secondary | ICD-10-CM

## 2016-05-30 NOTE — Patient Instructions (Signed)
Increase Iron to 2-3 times a day.   Your physician has requested that you go to the basement for the following lab work before leaving today: Stool test   Recheck bloodwork in 3 weeks.

## 2016-05-30 NOTE — Progress Notes (Signed)
Chief Complaint: IDA  HPI:  Tonya [name] is a 76 year old African-American female with a past medical history of CVA, COPD, heart murmur (most recent echo 08/05/12 with LVEF 65-70%), GERD, hyperlipidemia and hypertension, who was referred to me by Janith Lima, MD for a complaint of iron deficiency anemia .     Patient was previously followed in our clinic by Dr. Ardis Hughs. Her last EGD was performed 11/27/06 which was completely normal. There is history of a colonoscopy from 2007, though we do not have report, per computer results it says it was normal.   Patient had recent labs on 05/17/16 with a CBC showing hemoglobin decreased at 10.3, compared to 4 months ago at 10.8 and a year ago when this was normal at 13.1. CBC is otherwise normal. BMP was normal. Iron panel showed an iron low at 22 and a percent saturation low at 4.8. Ferritin was also low at 6.7. Patient was told to start iron supplements 3 times a day.   Today, the patient tells me that at the end of December she began to feel weak/dizzy, this worsened in January when she "couldn't stand up in church", because she felt too weak. She went to see her primary care physician who told her she was anemic. She started taking iron about a week ago but has only been using it once daily. She does tell me that yesterday she started to feel some better. Otherwise she has been too weak to do her normal daily activities becoming "short of breath" easily and sometimes getting dizzy upon standing. Patient tells me she was anemic around the time that she was pregnant and thought that this was chronic for her. She tells me she would like to avoid having any procedures done.   Patient denies fever, chills, seen bright red blood or melenic looking stool, change in bowel habits, weight loss, anorexia, nausea, vomiting, heartburn, reflux or abdominal pain.  Past Medical History:  Diagnosis Date  . Cerebrovascular accident ([place])   . Chronic edema    ? venous  insufficiency  . COPD (chronic obstructive pulmonary disease) ([place])    never had a problem breathing  . Enlarged heart    per pt  . Gallstones   . GERD (gastroesophageal reflux disease)   . Hyperlipidemia   . Hypertension     Past Surgical History:  Procedure Laterality Date  . ABDOMINAL HYSTERECTOMY    . CHOLECYSTECTOMY      Current Outpatient Prescriptions  Medication Sig Dispense Refill  . amLODipine-valsartan (EXFORGE) 5-160 MG tablet TAKE 1 TABLET DAILY 90 tablet 3  . aspirin 81 MG tablet Take 81 mg by mouth daily.      . Calcium Carbonate-Vitamin D (CALCIUM 600 + D PO) Take by mouth daily.      . carvedilol (COREG) 6.25 MG tablet Take 1 tablet (6.25 mg total) by mouth 2 (two) times daily with a meal. 180 tablet 1  . Cholecalciferol 2000 UNITS TABS Take 1 tablet (2,000 Units total) by mouth daily. 90 tablet 3  . cholestyramine (QUESTRAN) 4 G packet DISSOLVE 1 PACKET INTO 2 TO 6 OUNCES OF WATER AND DRINK 2 TIMES A DAY. 60 packet 11  . cyanocobalamin 2000 MCG tablet Take 1 tablet (2,000 mcg total) by mouth daily. 90 tablet 3  . ferrous sulfate 325 (65 FE) MG tablet Take 1 tablet (325 mg total) by mouth 3 (three) times daily with meals. 90 tablet 5  . hydrochlorothiazide (HYDRODIURIL) 25 MG tablet TAKE  1 TABLET DAILY FOR    BLOOD PRESSURE AND SWELLING 90 tablet 3  . Niacin CR 250 MG TBCR Take 1 tablet (250 mg total) by mouth daily. 90 tablet 3  . simvastatin (ZOCOR) 20 MG tablet Take 1 tablet (20 mg total) by mouth at bedtime. 90 tablet 3   No current facility-administered medications for this visit.     Allergies as of 05/30/2016 - Review Complete 05/30/2016  Allergen Reaction Noted  . Hydrocodone  04/30/2008  . Iohexol  11/15/2006  . Naproxen  04/30/2008  . Propoxyphene n-acetaminophen  04/30/2008    Family History  Problem Relation Age of Onset  . Cirrhosis Mother   . Heart disease Father   . Cancer Neg Hx     [name]    Social History   Social History  .  Marital status: Widowed    Spouse name: N/A  . Number of children: N/A  . Years of education: N/A   Occupational History  . Not on file.   Social History Main Topics  . Smoking status: Never Smoker  . Smokeless tobacco: Never Used  . Alcohol use No  . Drug use: No  . Sexual activity: Not Currently   Other Topics Concern  . Not on file   Social History Narrative  . No narrative on file    Review of Systems:    Constitutional:Positive for weakness No weight loss, fever or chills Skin: No rash  Cardiovascular: No chest pain   Respiratory: Positive for DOE Gastrointestinal: See HPI and otherwise negative Genitourinary: No dysuria  Neurological: Positive for dizziness  Musculoskeletal: No new muscle or joint pain Hematologic: No bleeding or bruising Psychiatric: No history of depression or anxiety   Physical Exam:  Vital signs: BP 122/70   Pulse 80   Ht '5\' 11"'  (1.803 m)   Wt 195 lb 9.6 oz (88.7 kg)   SpO2 98%   BMI 27.28 kg/m   Constitutional:   Pleasant elderly African American female appears to be in NAD, Well developed, Well nourished, alert and cooperative Head:  Normocephalic and atraumatic. Eyes:   PEERL, EOMI. No icterus. Conjunctiva pink. Ears:  Normal auditory acuity. Neck:  Supple Throat: Oral cavity and pharynx without inflammation, swelling or lesion.  Respiratory: Respirations even and unlabored. Lungs clear to auscultation bilaterally.   No wheezes, crackles, or rhonchi.  Cardiovascular: Normal S1, S2. +heart murmur Regular rate and rhythm. No peripheral edema, cyanosis or pallor.  Gastrointestinal:  Soft, nondistended, nontender. No rebound or guarding. Normal bowel sounds. No appreciable masses or hepatomegaly. Rectal:  Not performed.  Msk:  Symmetrical without gross deformities. Without edema, no deformity or joint abnormality. Ambulated with a cane Neurologic:  Alert and  oriented x4;  grossly normal neurologically.  Skin:   Dry and intact without  significant lesions or rashes. Psychiatric: Demonstrates good judgement and reason without abnormal affect or behaviors.  RELEVANT LABS AND IMAGING: CBC    Component Value Date/Time   WBC 5.8 05/17/2016 1204   RBC 4.31 05/17/2016 1204   RBC 4.16 05/17/2016 1204   HGB 10.3 (L) 05/17/2016 1204   HCT 32.5 (L) 05/17/2016 1204   PLT 266.0 05/17/2016 1204   MCV 75.6 (L) 05/17/2016 1204   MCHC 31.6 05/17/2016 1204   RDW 15.5 05/17/2016 1204   LYMPHSABS 2.0 05/17/2016 1204   MONOABS 0.5 05/17/2016 1204   EOSABS 0.1 05/17/2016 1204   BASOSABS 0.0 05/17/2016 1204    CMP     Component Value Date/Time  NA 141 05/17/2016 1204   K 4.8 05/17/2016 1204   CL 109 05/17/2016 1204   CO2 27 05/17/2016 1204   GLUCOSE 127 (H) 05/17/2016 1204   BUN 8 05/17/2016 1204   CREATININE 0.73 05/17/2016 1204   CALCIUM 9.6 05/17/2016 1204   PROT 7.1 01/31/2016 1341   ALBUMIN 3.9 01/31/2016 1341   AST 16 01/31/2016 1341   ALT 12 01/31/2016 1341   ALKPHOS 66 01/31/2016 1341   BILITOT 0.5 01/31/2016 1341   GFRNONAA 145.85 04/26/2010 1040   GFRAA  12/25/2008 1110    >60        The eGFR has been calculated using the MDRD equation. This calculation has not been validated in all clinical situations. eGFR's persistently <60 mL/min signify possible Chronic Kidney Disease.   Ref Range & Units 13d ago   Iron 42 - 145 ug/dL 22    Transferrin 212.0 - 360.0 mg/dL 329.0   Saturation Ratios 20.0 - 50.0 % 4.8      Ref Range & Units 13d ago  Ferritin 10.0 - 291.0 ng/mL 6.7      Assessment: 1. IDA: Discovered recently 05/17/16, apparently this hgb has been declining over the past year, currently hemoglobin 10.3 with a half point drop over the past 4 months, iron also low; discussed the possibility of upper versus lower GI bleed with the patient, she declines procedures today  Plan: 1. Discussed possible EGD and colonoscopy with the patient but she declines for now. She is aware of the risks of delayed  diagnosis of things such as colorectal cancer. She would like to avoid these procedures if at all possible and would like to try taking iron for a month to see if this helps 2. Recommend the patient increase her iron supplement to at least 2-3 times per day. We discussed that if she develops constipation she can use MiraLAX. 3. Will check a fecal occult blood test today. If positive, will again discuss procedures with the patient. 4. Ordered repeat iron studies as well as ferritin and hemoglobin 3 weeks from now, if things are improving and patient still declines procedures, she can stay on iron supplement and follow with her PCP. 5. Patient to follow in clinic as needed in the future with Dr. Ardis Hughs or myself.  Ellouise Newer, PA-C [facility] 05/30/2016, 11:29 AM  Cc: Janith Lima, MD

## 2016-05-31 NOTE — Progress Notes (Signed)
I agree with the above note, plan 

## 2016-06-09 ENCOUNTER — Telehealth: Payer: Self-pay | Admitting: Internal Medicine

## 2016-06-09 DIAGNOSIS — R011 Cardiac murmur, unspecified: Secondary | ICD-10-CM

## 2016-06-09 DIAGNOSIS — I10 Essential (primary) hypertension: Secondary | ICD-10-CM

## 2016-06-09 NOTE — Telephone Encounter (Signed)
Contacted pt and pt dtr. Offered an appointment but refused. Pt dtr feels that the iron supplement is causing the increase in BP and stated that the BP was not elevated until she started taking the iron supplement. Reviewed medications with and confirmed what is taken. All seems to be accurate according to pt dtr.   Please advise

## 2016-06-09 NOTE — Telephone Encounter (Signed)
States that Dr. Ronnald Ramp prescribed ferrous sulfate 325.  Patient has been taking this for two weeks.  Since she has started taking this she has noticed that her blood pressure has been going up.  States  bp has been running around 144/84 for this past week.  Is requesting call back in regard.

## 2016-06-13 NOTE — Telephone Encounter (Signed)
Pt dtr called back. Informed of PCP recommendation and statement that Iron supplement would not interfere with BP or BP medications.   Per PCP order for [facility] skilled RN assessment for medication management and BP evaluation.

## 2016-06-14 ENCOUNTER — Telehealth: Payer: Self-pay | Admitting: Internal Medicine

## 2016-06-14 NOTE — Telephone Encounter (Signed)
error 

## 2016-06-24 ENCOUNTER — Other Ambulatory Visit: Payer: Self-pay | Admitting: Internal Medicine

## 2016-06-24 DIAGNOSIS — E785 Hyperlipidemia, unspecified: Secondary | ICD-10-CM

## 2016-06-26 ENCOUNTER — Other Ambulatory Visit: Payer: Self-pay | Admitting: Internal Medicine

## 2016-06-26 DIAGNOSIS — Z1231 Encounter for screening mammogram for malignant neoplasm of breast: Secondary | ICD-10-CM

## 2016-06-27 ENCOUNTER — Ambulatory Visit: Payer: Medicare HMO | Admitting: Internal Medicine

## 2016-06-28 ENCOUNTER — Encounter: Payer: Self-pay | Admitting: Internal Medicine

## 2016-06-28 ENCOUNTER — Ambulatory Visit (INDEPENDENT_AMBULATORY_CARE_PROVIDER_SITE_OTHER): Payer: Medicare HMO | Admitting: Internal Medicine

## 2016-06-28 VITALS — BP 140/78 | HR 84 | Temp 98.0°F | Resp 16 | Ht 71.5 in | Wt 191.8 lb

## 2016-06-28 DIAGNOSIS — I10 Essential (primary) hypertension: Secondary | ICD-10-CM | POA: Diagnosis not present

## 2016-06-28 DIAGNOSIS — D508 Other iron deficiency anemias: Secondary | ICD-10-CM | POA: Diagnosis not present

## 2016-06-28 MED ORDER — FERIVA 21/7 75-1 MG PO TABS: 1.0000 | ORAL_TABLET | Freq: Every day | ORAL | 1 refills | Status: DC

## 2016-06-28 NOTE — Progress Notes (Signed)
Subjective:  Patient ID: Tonya [name], female    DOB: [birth year]  Age: 76 y.o. MRN: [id number]  CC: Anemia and Hypertension   Tonya [name] presents for a blood pressure check. A few weeks ago she had a few episodes of dizziness and vertigo but she tells me those symptoms have resolved. She is currently taking an iron supplement but has to take it 2-3 times a day so she is asking me to change her to a once day iron formulation. She otherwise feels well and offers no other complaints today.  Outpatient Medications Prior to Visit  Medication Sig Dispense Refill  . amLODipine-valsartan (EXFORGE) 5-160 MG tablet TAKE 1 TABLET DAILY 90 tablet 3  . aspirin 81 MG tablet Take 81 mg by mouth daily.      . Calcium Carbonate-Vitamin D (CALCIUM 600 + D PO) Take by mouth daily.      . carvedilol (COREG) 6.25 MG tablet Take 1 tablet (6.25 mg total) by mouth 2 (two) times daily with a meal. 180 tablet 1  . Cholecalciferol 2000 UNITS TABS Take 1 tablet (2,000 Units total) by mouth daily. 90 tablet 3  . cholestyramine (QUESTRAN) 4 G packet DISSOLVE 1 PACKET INTO 2 TO 6 OUNCES OF WATER AND DRINK 2 TIMES A DAY. 60 packet 11  . cyanocobalamin 2000 MCG tablet Take 1 tablet (2,000 mcg total) by mouth daily. 90 tablet 3  . hydrochlorothiazide (HYDRODIURIL) 25 MG tablet TAKE 1 TABLET DAILY FOR    BLOOD PRESSURE AND SWELLING 90 tablet 3  . Niacin CR 250 MG TBCR Take 1 tablet (250 mg total) by mouth daily. 90 tablet 3  . simvastatin (ZOCOR) 20 MG tablet TAKE 1 TABLET AT BEDTIME 90 tablet 3  . ferrous sulfate 325 (65 FE) MG tablet Take 1 tablet (325 mg total) by mouth 3 (three) times daily with meals. 90 tablet 5   No facility-administered medications prior to visit.     ROS Review of Systems  Constitutional: Negative for appetite change, chills, fatigue and unexpected weight change.  HENT: Negative.  Negative for trouble swallowing.   Eyes: Negative for visual disturbance.  Respiratory: Negative for  cough, chest tightness, shortness of breath and wheezing.   Cardiovascular: Negative for chest pain, palpitations and leg swelling.  Gastrointestinal: Negative for abdominal pain, constipation, diarrhea, nausea and vomiting.  Endocrine: Negative.   Genitourinary: Negative.  Negative for difficulty urinating.  Musculoskeletal: Negative.  Negative for back pain, myalgias and neck pain.  Skin: Negative.  Negative for color change, pallor and rash.  Allergic/Immunologic: Negative.   Neurological: Negative.  Negative for dizziness, tremors, weakness, light-headedness and headaches.  Hematological: Negative.  Negative for adenopathy. Does not bruise/bleed easily.  Psychiatric/Behavioral: Negative.     Objective:  BP 140/78 (BP Location: Right Arm, Patient Position: Sitting, Cuff Size: Normal)   Pulse 84   Temp 98 F (36.7 C) (Oral)   Resp 16   Ht 5' 11.5" (1.816 m)   Wt 191 lb 12 oz (87 kg)   SpO2 96%   BMI 26.37 kg/m   BP Readings from Last 3 Encounters:  06/28/16 140/78  05/30/16 122/70  05/17/16 130/70    Wt Readings from Last 3 Encounters:  06/28/16 191 lb 12 oz (87 kg)  05/30/16 195 lb 9.6 oz (88.7 kg)  05/17/16 192 lb 6.4 oz (87.3 kg)    Physical Exam  Constitutional: She is oriented to person, place, and time. No distress.  HENT:  Mouth/Throat: Oropharynx  is clear and moist. No oropharyngeal exudate.  Eyes: Conjunctivae are normal. Right eye exhibits no discharge. Left eye exhibits no discharge. No scleral icterus.  Neck: Normal range of motion. Neck supple. No JVD present. No tracheal deviation present. No thyromegaly present.  Cardiovascular: Normal rate, regular rhythm, normal heart sounds and intact distal pulses.  Exam reveals no gallop and no friction rub.   No murmur heard. Pulmonary/Chest: Effort normal and breath sounds normal. No respiratory distress. She has no wheezes. She has no rales. She exhibits no tenderness.  Abdominal: Soft. Bowel sounds are normal.  She exhibits no distension and no mass. There is no tenderness. There is no rebound and no guarding.  Musculoskeletal: Normal range of motion. She exhibits no edema, tenderness or deformity.  Lymphadenopathy:    She has no cervical adenopathy.  Neurological: She is oriented to person, place, and time.  Skin: Skin is warm and dry. No rash noted. She is not diaphoretic. No erythema. No pallor.  Vitals reviewed.   Lab Results  Component Value Date   WBC 5.8 05/17/2016   HGB 10.3 (L) 05/17/2016   HCT 32.5 (L) 05/17/2016   PLT 266.0 05/17/2016   GLUCOSE 127 (H) 05/17/2016   CHOL 159 01/31/2016   TRIG 110.0 01/31/2016   HDL 46.70 01/31/2016   LDLCALC 90 01/31/2016   ALT 12 01/31/2016   AST 16 01/31/2016   NA 141 05/17/2016   K 4.8 05/17/2016   CL 109 05/17/2016   CREATININE 0.73 05/17/2016   BUN 8 05/17/2016   CO2 27 05/17/2016   TSH 3.12 01/31/2016   HGBA1C 5.9 01/31/2016    Mr Lumbar Spine Wo Contrast  Result Date: 02/10/2016 CLINICAL DATA:  76 year old female with right side lumbar pain, radiculitis, sciatica. Initial encounter. EXAM: MRI LUMBAR SPINE WITHOUT CONTRAST TECHNIQUE: Multiplanar, multisequence MR imaging of the lumbar spine was performed. No intravenous contrast was administered. COMPARISON:  Lumbar MRI 05/11/2008.  Lumbar radiographs 04/07/2008. FINDINGS: Segmentation: Normal as seen on the comparison radiographs. This is the same numbering system used on the 2010 MRI. Alignment: Mild retrolisthesis of L5 on S1 is stable. Otherwise preserved lumbar lordosis. Vertebrae: Mild marrow edema at the endplates of E3-P2, appears in part related to an inferior L4 endplate Schmorl node which is new since 2010 (series 6, image 7). No other No marrow edema or evidence of acute osseous abnormality. Visible sacrum is intact. Conus medullaris: Extends to the L2 level and appears normal. Paraspinal and other soft tissues: Visualized abdominal viscera and paraspinal soft tissues are  within normal limits. Disc levels: T11-T12: Chronic mild circumferential disc bulge. Left paracentral component may be increased from the prior MRI (series 7, image 1) but there is no significant stenosis. T12-L1:  Mild facet hypertrophy. L1-L2:  Mild facet hypertrophy. L2-L3: Chronic circumferential disc bulge with bulky far lateral component. Chronic moderate facet and ligament flavum hypertrophy. This level appears stable with borderline to Mild spinal and bilateral L2 foraminal stenosis. L3-L4: Chronic left eccentric circumferential disc bulge. Superimposed new left lateral recess small caudal disc extrusion (series 7, images 18 and 19). Moderate facet and ligament flavum hypertrophy appears stable. Increased moderate to severe left lateral recess stenosis with otherwise stable mild spinal stenosis. The left foraminal and far lateral component of the disc also appears increased, with increased mild to moderate left foraminal stenosis. Stable borderline to mild right foraminal stenosis. L4-L5: Chronic circumferential disc bulge with increased bulky broad-based posterior component since 2010. Moderate facet and ligament flavum hypertrophy. Progressed  moderate to severe spinal stenosis (series 7, image 24) with left greater than right lateral recess stenosis. Endplate spurring. Moderate bilateral L4 foraminal stenosis, improved on the right owing to regressed right foraminal disc extrusion. L5-S1: Chronic severe disc space loss and bulky circumferential disc osteophyte complex. Broad-based posterior component appears stable. Moderate facet hypertrophy is stable. Moderate to severe left greater than right lateral recess stenosis is stable. Moderate left and mild right L5 foraminal stenosis is stable. IMPRESSION: 1. Progressed chronic disc and posterior element degeneration at L4-L5 since 2010, now with moderate to severe spinal and left greater than right lateral recess stenosis. Moderate biforaminal stenosis,  although improved on the right owing to interval regressed right foraminal disc herniation. 2. Progressed chronic disc degeneration at L3-L4 with increased moderate to severe left lateral recess stenosis, mild spinal stenosis, and mild to moderate left foraminal stenosis. 3. Stable advanced chronic disc and endplate degeneration at L5-S1 with moderate to severe left greater than right lateral recess and moderate left foraminal stenosis. 4. Mild endplate marrow edema at L4-L5, in part related to an L4 inferior endplate Schmorl node which is new since 2010. Electronically Signed   By: Genevie Ann M.D.   On: 02/10/2016 16:37    Assessment & Plan:   Hartlyn was seen today for anemia and hypertension.  Diagnoses and all orders for this visit:  Essential hypertension- Her BP is well controlled  Other iron deficiency anemia -     FeAsp-B12-FA-C-DSS-SuccAc-Zn (FERIVA 21/7) 75-1 MG TABS; Take 1 tablet by mouth daily.   I have discontinued Ms. Lundblad's ferrous sulfate. I am also having her start on [place] 21/7. Additionally, I am having her maintain her aspirin, Calcium Carbonate-Vitamin D (CALCIUM 600 + D PO), Cholecalciferol, cholestyramine, carvedilol, cyanocobalamin, Niacin CR, amLODipine-valsartan, hydrochlorothiazide, and simvastatin.  Meds ordered this encounter  Medications  . FeAsp-B12-FA-C-DSS-SuccAc-Zn (FERIVA 21/7) 75-1 MG TABS    Sig: Take 1 tablet by mouth daily.    Dispense:  84 tablet    Refill:  1     Follow-up: Return in about 3 months (around 09/28/2016).  Scarlette Calico, MD

## 2016-06-28 NOTE — Patient Instructions (Signed)
Iron Deficiency Anemia, Adult Iron deficiency anemia is a condition in which the concentration of red blood cells or hemoglobin in the blood is below normal because of too little iron. Hemoglobin is a substance in red blood cells that carries oxygen to the body's tissues. When the concentration of red blood cells or hemoglobin is too low, not enough oxygen reaches these tissues. Iron deficiency anemia is usually long-lasting (chronic) and it develops over time. It may or may not cause symptoms. It is a common type of anemia. What are the causes? This condition may be caused by:  Not enough iron in the diet.  Blood loss caused by bleeding in the intestine.  Blood loss from a gastrointestinal condition like Crohn disease.  Frequent blood draws, such as from blood donation.  Abnormal absorption in the gut.  Heavy menstrual periods in women.  Cancers of the gastrointestinal system, such as colon cancer. What are the signs or symptoms? Symptoms of this condition may include:  Fatigue.  Headache.  Pale skin, lips, and nail beds.  Poor appetite.  Weakness.  Shortness of breath.  Dizziness.  Cold hands and feet.  Fast or irregular heartbeat.  Irritability. This is more common in severe anemia.  Rapid breathing. This is more common in severe anemia. Mild anemia may not cause any symptoms. How is this diagnosed? This condition is diagnosed based on:  Your medical history.  A physical exam.  Blood tests. You may have additional tests to find the underlying cause of your anemia, such as:  Testing for blood in the stool (fecal occult blood test).  A procedure to see inside your colon and rectum (colonoscopy).  A procedure to see inside your esophagus and stomach (endoscopy).  A test in which cells are removed from bone marrow (bone marrow aspiration) or fluid is removed from the bone marrow to be examined (biopsy). This is rarely needed. How is this treated? This  condition is treated by correcting the cause of your iron deficiency. Treatment may involve:  Adding iron-rich foods to your diet.  Taking iron supplements. If you are pregnant or breastfeeding, you may need to take extra iron because your normal diet usually does not provide the amount of iron that you need.  Increasing vitamin C intake. Vitamin C helps your body absorb iron. Your health care provider may recommend that you take iron supplements along with a glass of orange juice or a vitamin C supplement.  Medicines to make heavy menstrual flow lighter.  Surgery. You may need repeat blood tests to determine whether treatment is working. Depending on the underlying cause, the anemia should be corrected within 2 months of starting treatment. If the treatment does not seem to be working, you may need more testing. Follow these instructions at home: Medicines  Take over-the-counter and prescription medicines only as told by your health care provider. This includes iron supplements and vitamins.  If you cannot tolerate taking iron supplements by mouth, talk with your health care provider about taking them through a vein (intravenously) or an injection into a muscle.  For the best iron absorption, you should take iron supplements when your stomach is empty. If you cannot tolerate them on an empty stomach, you may need to take them with food.  Do not drink milk or take antacids at the same time as your iron supplements. Milk and antacids may interfere with iron absorption.  Iron supplements can cause constipation. To prevent constipation, include fiber in your diet as told   by your health care provider. A stool softener may also be recommended. Eating and drinking  Talk with your health care provider before changing your diet. He or she may recommend that you eat foods that contain a lot of iron, such as:  Liver.  Low-fat (lean) beef.  Breads and cereals that have iron added to them (are  fortified).  Eggs.  Dried fruit.  Dark green, leafy vegetables.  To help your body use the iron from iron-rich foods, eat those foods at the same time as fresh fruits and vegetables that are high in vitamin C. Foods that are high in vitamin C include:  Oranges.  Peppers.  Tomatoes.  Mangoes.  Drinkenoughfluid to keep your urine clear or pale yellow. General instructions  Return to your normal activities as told by your health care provider. Ask your health care provider what activities are safe for you.  Practice good hygiene. Anemia can make you more prone to illness and infection.  Keep all follow-up visits as told by your health care provider. This is important. Contact a health care provider if:  You feel nauseous or you vomit.  You feel weak.  You have unexplained sweating.  You develop symptoms of constipation, such as:  Having fewer than three bowel movements a week.  Straining to have a bowel movement.  Having stools that are hard, dry, or larger than normal.  Feeling full or bloated.  Pain in the lower abdomen.  Not feeling relief after having a bowel movement. Get help right away if:  You faint. If this happens, do not drive yourself to the hospital. Call your local emergency services (911 in the [place]  You have chest pain.  You have shortness of breath that:  Is severe.  Gets worse with physical activity.  You have a rapid heartbeat.  You become light-headed when getting up from a sitting or lying down position. This information is not intended to replace advice given to you by your health care provider. Make sure you discuss any questions you have with your health care provider. Document Released: 04/07/2000 Document Revised: 12/29/2015 Document Reviewed: 12/29/2015 Elsevier Interactive Patient Education  2017 [employer] Inc.  

## 2016-06-28 NOTE — Progress Notes (Signed)
Pre visit review using our clinic review tool, if applicable. No additional management support is needed unless otherwise documented below in the visit note. 

## 2016-07-06 ENCOUNTER — Ambulatory Visit (HOSPITAL_COMMUNITY)
Admission: RE | Admit: 2016-07-06 | Discharge: 2016-07-06 | Disposition: A | Discharge status: Home / Self Care | Payer: Medicare HMO | Source: Ambulatory Visit | Attending: Internal Medicine | Admitting: Internal Medicine

## 2016-07-06 DIAGNOSIS — Z1231 Encounter for screening mammogram for malignant neoplasm of breast: Secondary | ICD-10-CM | POA: Diagnosis not present

## 2016-07-06 LAB — HM MAMMOGRAPHY

## 2016-07-11 ENCOUNTER — Telehealth: Payer: Self-pay | Admitting: *Deleted

## 2016-07-11 DIAGNOSIS — D508 Other iron deficiency anemias: Secondary | ICD-10-CM

## 2016-07-11 NOTE — Telephone Encounter (Signed)
Yes, keep taking it

## 2016-07-11 NOTE — Telephone Encounter (Signed)
Left msg on triage stating mom was given samples of some iron pills at her last visit wanting to know if she need to continue taking because she is almost out...Johny Chess

## 2016-07-12 NOTE — Telephone Encounter (Signed)
Called pt daughter no answer LMOM RTC.../lmb 

## 2016-07-17 ENCOUNTER — Other Ambulatory Visit: Payer: Self-pay | Admitting: Internal Medicine

## 2016-07-17 ENCOUNTER — Other Ambulatory Visit: Payer: Self-pay | Admitting: *Deleted

## 2016-07-17 DIAGNOSIS — D508 Other iron deficiency anemias: Secondary | ICD-10-CM

## 2016-07-17 MED ORDER — FERIVA 21/7 75-1 MG PO TABS: 1.0000 | ORAL_TABLET | Freq: Every day | ORAL | 1 refills | Status: DC

## 2016-07-17 MED ORDER — FERROUS SULFATE 325 (65 FE) MG PO TABS: 325.0000 mg | ORAL_TABLET | Freq: Two times a day (BID) | ORAL | 5 refills | Status: DC

## 2016-07-17 MED ORDER — FERIVA 21/7 75-1 MG PO TABS: 1.0000 | ORAL_TABLET | Freq: Every day | ORAL | 5 refills | Status: DC

## 2016-07-17 NOTE — Telephone Encounter (Signed)
Iron supplement changed and RX sent

## 2016-07-17 NOTE — Telephone Encounter (Signed)
Called pt she states her insurance would not cover the [place], and she is not able to afford. Requesting for samples or MD to rx alternative...Tonya [name]

## 2016-07-17 NOTE — Telephone Encounter (Signed)
Called pt to inform pt MD sent ferrous sulfate. Pt states that is the dame med that he wanted her to stop taking because med was not helping. Don't think she need to take it if its not helping. Pt is wanting a rzx to be sent [employer] to see how much the Lyda Perone would be. Inform pt will resend to [employer]...Johny Chess

## 2016-07-17 NOTE — Addendum Note (Signed)
Addended by: Earnstine Regal on: 07/17/2016 11:32 AM   Modules accepted: Orders

## 2016-07-27 ENCOUNTER — Telehealth: Payer: Self-pay | Admitting: Internal Medicine

## 2016-07-27 DIAGNOSIS — I10 Essential (primary) hypertension: Secondary | ICD-10-CM

## 2016-07-27 NOTE — Telephone Encounter (Signed)
Pt daughter called wanting to know if they can receive some free samples of this medication while they are trying to get it prior authorized .   FeAsp-B12-FA-C-DSS-SuccAc-Zn (FERIVA 21/7) 75-1 MG TABS

## 2016-07-27 NOTE — Telephone Encounter (Signed)
Pts daughter called stating that the prescription for carvedilol (COREG) 6.25 MG tablet needs to be sent to the [facility]. Please advise.

## 2016-07-28 MED ORDER — CARVEDILOL 6.25 MG PO TABS: 6.2500 mg | ORAL_TABLET | Freq: Two times a day (BID) | ORAL | 1 refills | Status: DC

## 2016-07-28 NOTE — Telephone Encounter (Signed)
Pts daughter called back checking on the prescription for Coreg 6.25mg  to [employer] [facility] and also checking on the samples. I will call her when it has been sent.  Thanks [employer]

## 2016-07-28 NOTE — Telephone Encounter (Signed)
Spoke with pts daughter to let her know that the medication was sent in. I told her that we did not have any samples of the [facility] but that she could check back in a couple weeks or see how much it was at the pharmacy.

## 2016-07-28 NOTE — Telephone Encounter (Signed)
erx sent.   Will you call them back and inform of same please.

## 2016-07-31 ENCOUNTER — Other Ambulatory Visit: Payer: Self-pay | Admitting: Internal Medicine

## 2016-07-31 ENCOUNTER — Telehealth: Payer: Self-pay | Admitting: *Deleted

## 2016-07-31 DIAGNOSIS — I10 Essential (primary) hypertension: Secondary | ICD-10-CM

## 2016-07-31 NOTE — Telephone Encounter (Signed)
Daughter left msg on triage stating [employer] mail service is faxing request for mom carvedilol to be refill. She has ask for refill last week, but [facility] stated they have not received script. Called [employer] spoke w/pharmacist Lincoln Maxin verified if they received electronic script that was sent on 07/28/16 per Lilia they have not received. Gave verbal request from 4/6 to fill expedite request since pt is almost out. Notified daughter w/status on refill...Johny Chess

## 2016-08-01 NOTE — Telephone Encounter (Signed)
rq to change Feriva to Ferrex?

## 2016-08-01 NOTE — Telephone Encounter (Signed)
The pts daughter called and said that they can not find this anywhere.She called her pharmacy to see if there was anything else that would be comparable that her insurance would cover. They told her that she could try Ferrex. Could this be sent in for her? Please advise. She said that she would like to have it sent to [facility] on [address] in [place] because there is a coupon that she can use to get it cheaper.

## 2016-08-02 ENCOUNTER — Other Ambulatory Visit: Payer: Self-pay | Admitting: Internal Medicine

## 2016-08-02 DIAGNOSIS — D508 Other iron deficiency anemias: Secondary | ICD-10-CM

## 2016-08-02 MED ORDER — FERREX 150 PLUS 150-50-50 MG PO CAPS: 1.0000 | ORAL_CAPSULE | Freq: Two times a day (BID) | ORAL | 5 refills | Status: DC

## 2016-08-02 NOTE — Telephone Encounter (Signed)
done

## 2016-08-04 ENCOUNTER — Telehealth: Payer: Self-pay | Admitting: Internal Medicine

## 2016-08-04 DIAGNOSIS — D508 Other iron deficiency anemias: Secondary | ICD-10-CM

## 2016-08-04 MED ORDER — FERREX 150 PLUS 150-50-50 MG PO CAPS: 1.0000 | ORAL_CAPSULE | Freq: Two times a day (BID) | ORAL | 5 refills | Status: DC

## 2016-08-04 NOTE — Telephone Encounter (Signed)
Left detailed message that rx has been sent to pharmacy per request. erx sent.

## 2016-08-04 NOTE — Telephone Encounter (Signed)
Pt daughter called stating this medication mix up has been going on since last week and she is threatening to changer her primary, states she does not understand why the medication was sent to the wrong pharmacy when we were told to send it to [facility].

## 2016-08-04 NOTE — Telephone Encounter (Signed)
Fe Asp Gly-Fe Polysac-Suc Ac-C (FERREX 150 PLUS) 150-50-50 MG CAPS    This was sent to [facility], She no longer uses the pharm at all  Please resend [facility] on [address] in [place]

## 2016-08-16 ENCOUNTER — Telehealth: Payer: Self-pay | Admitting: Internal Medicine

## 2016-08-16 NOTE — Telephone Encounter (Signed)
Called wanting results from 05/17/16

## 2016-08-16 NOTE — Telephone Encounter (Signed)
States she is having trouble breathing. Would like a referral put in for Pulm or Cardio.

## 2016-08-17 ENCOUNTER — Other Ambulatory Visit: Payer: Self-pay | Admitting: Internal Medicine

## 2016-08-17 DIAGNOSIS — R69 Illness, unspecified: Secondary | ICD-10-CM | POA: Diagnosis not present

## 2016-08-17 DIAGNOSIS — R011 Cardiac murmur, unspecified: Secondary | ICD-10-CM

## 2016-08-17 DIAGNOSIS — R9431 Abnormal electrocardiogram [ECG] [EKG]: Secondary | ICD-10-CM

## 2016-08-17 NOTE — Telephone Encounter (Signed)
Tonya [name] informed of cardiology referral.

## 2016-08-17 NOTE — Telephone Encounter (Signed)
Cardiology referral ordered 

## 2016-08-17 NOTE — Telephone Encounter (Signed)
Gave dtr Tonya [name]) results. Request for referral to cardio or pulmonary. Please advise.

## 2016-08-28 DIAGNOSIS — R69 Illness, unspecified: Secondary | ICD-10-CM | POA: Diagnosis not present

## 2016-08-30 ENCOUNTER — Encounter: Payer: Self-pay | Admitting: Cardiology

## 2016-09-11 DIAGNOSIS — R69 Illness, unspecified: Secondary | ICD-10-CM | POA: Diagnosis not present

## 2016-09-19 ENCOUNTER — Encounter (INDEPENDENT_AMBULATORY_CARE_PROVIDER_SITE_OTHER): Payer: Self-pay

## 2016-09-19 ENCOUNTER — Encounter: Payer: Self-pay | Admitting: Cardiology

## 2016-09-19 ENCOUNTER — Ambulatory Visit (INDEPENDENT_AMBULATORY_CARE_PROVIDER_SITE_OTHER): Payer: Medicare HMO | Admitting: Cardiology

## 2016-09-19 VITALS — BP 136/82 | HR 85 | Ht 71.5 in | Wt 190.0 lb

## 2016-09-19 DIAGNOSIS — I493 Ventricular premature depolarization: Secondary | ICD-10-CM

## 2016-09-19 DIAGNOSIS — R9431 Abnormal electrocardiogram [ECG] [EKG]: Secondary | ICD-10-CM

## 2016-09-19 DIAGNOSIS — R011 Cardiac murmur, unspecified: Secondary | ICD-10-CM | POA: Insufficient documentation

## 2016-09-19 DIAGNOSIS — I10 Essential (primary) hypertension: Secondary | ICD-10-CM

## 2016-09-19 DIAGNOSIS — R0602 Shortness of breath: Secondary | ICD-10-CM | POA: Insufficient documentation

## 2016-09-19 NOTE — Progress Notes (Signed)
Cardiology Office Note    Date:  09/19/2016   ID:  Tonya, [name] May 21, 1940, MRN [id number]  PCP:  Janith Lima, MD  Cardiologist:  Fransico Him, MD   Chief Complaint  Patient presents with  . New Evaluation    SOB, PVC, heart murmur    History of Present Illness:  Tonya [name] is a 76 y.o. female who is being seen today for the evaluation of heart murmur, SOB and abnormal EKG at the request of Janith Lima, MD.  The patient has a history of CVA, chronic LE edema, COPD, CM, HTN and hyperlipidemia.  She was seen by her PCP and complained of problems with SOB which has gotten progressively worse over the past year.  She notices the SOB when she walks around but denies any chest pain or pressure.  She has chronic LE edema since her CVA but is stable.  She denies any orthopnea or PND.  She notices occasional skipped heart beats especially if she stands too much or talks too much.  She also has problems with dizziness like a "buzzing" and movement of her head which she is still.  She says that she has had a heart murmur for years as well.  She had a stress test done a year ago which showed no ischemia.  An echo in 2014 showed normal LVF with moderate LVH with no dynamic obstruction but there was turbulence in the LVOT.      Past Medical History:  Diagnosis Date  . Cerebrovascular accident ([place])   . Chronic edema    ? venous insufficiency  . COPD (chronic obstructive pulmonary disease) ([place])    never had a problem breathing  . Enlarged heart    per pt  . Gallstones   . GERD (gastroesophageal reflux disease)   . Hyperlipidemia   . Hypertension     Past Surgical History:  Procedure Laterality Date  . ABDOMINAL HYSTERECTOMY    . CHOLECYSTECTOMY      Current Medications: Current Meds  Medication Sig  . amLODipine-valsartan (EXFORGE) 5-160 MG tablet TAKE 1 TABLET DAILY  . aspirin 81 MG tablet Take 81 mg by mouth daily.    . Calcium Carbonate-Vitamin D (CALCIUM 600  + D PO) Take by mouth daily.    . carvedilol (COREG) 6.25 MG tablet TAKE 1 TABLET TWICE A DAY  WITH A MEAL  . Cholecalciferol 2000 UNITS TABS Take 1 tablet (2,000 Units total) by mouth daily.  . cholestyramine (QUESTRAN) 4 G packet DISSOLVE 1 PACKET INTO 2 TO 6 OUNCES OF WATER AND DRINK 2 TIMES A DAY.  . cyanocobalamin 2000 MCG tablet Take 1 tablet (2,000 mcg total) by mouth daily.  . hydrochlorothiazide (HYDRODIURIL) 25 MG tablet TAKE 1 TABLET DAILY FOR    BLOOD PRESSURE AND SWELLING  . Niacin CR 250 MG TBCR Take 1 tablet (250 mg total) by mouth daily.  . simvastatin (ZOCOR) 20 MG tablet TAKE 1 TABLET AT BEDTIME    Allergies:   Hydrocodone; Iohexol; Naproxen; and Propoxyphene n-acetaminophen   Social History   Social History  . Marital status: Widowed    Spouse name: N/A  . Number of children: N/A  . Years of education: N/A   Social History Main Topics  . Smoking status: Never Smoker  . Smokeless tobacco: Never Used  . Alcohol use No  . Drug use: No  . Sexual activity: Not Currently   Other Topics Concern  . None   Social  History Narrative  . None     Family History:  The patient's family history includes Cirrhosis in her mother; Heart disease in her father.   ROS:   Please see the history of present illness.    ROS All other systems reviewed and are negative.  No flowsheet data found.     PHYSICAL EXAM:   VS:  BP 136/82   Pulse 85   Ht 5' 11.5" (1.816 m)   Wt 190 lb (86.2 kg)   BMI 26.13 kg/m    GEN: Well nourished, well developed, in no acute distress  HEENT: normal  Neck: no JVD, carotid bruits, or masses Cardiac: RRR; no rubs, or gallops.  Intact distal pulses bilaterally. 2/6 SM at RUSB to [place].  2+ pitting edema of her feet.   Respiratory:  clear to auscultation bilaterally, normal work of breathing GI: soft, nontender, nondistended, + BS MS: no deformity or atrophy  Skin: warm and dry, no rash Neuro:  Alert and Oriented x 3, Strength and sensation  are intact Psych: euthymic mood, full affect  Wt Readings from Last 3 Encounters:  09/19/16 190 lb (86.2 kg)  06/28/16 191 lb 12 oz (87 kg)  05/30/16 195 lb 9.6 oz (88.7 kg)      Studies/Labs Reviewed:   EKG:  EKG is ordered today.  The ekg ordered today demonstrates NSR with LBBB and anterior infarct and frequent PVC(single, couplets and triplets)  Recent Labs: 01/31/2016: ALT 12; TSH 3.12 05/17/2016: BUN 8; Creatinine, Ser 0.73; Hemoglobin 10.3; Platelets 266.0; Potassium 4.8; Sodium 141   Lipid Panel    Component Value Date/Time   CHOL 159 01/31/2016 1341   TRIG 110.0 01/31/2016 1341   HDL 46.70 01/31/2016 1341   CHOLHDL 3 01/31/2016 1341   VLDL 22.0 01/31/2016 1341   LDLCALC 90 01/31/2016 1341    Additional studies/ records that were reviewed today include:  Office notes from PCP    ASSESSMENT:    1. SOB (shortness of breath)   2. Essential hypertension   3. Heart murmur   4. PVC's (premature ventricular contractions)      PLAN:  In order of problems listed above:  1. SOB ? Etiology.  She does have a history of COPD.  She has CRFs including HTN, hyperlipidemia and family history of CAD.  Her EKG shows frequent PVCs and salvos.  I will get a lexiscan myoview to rule out ischemia.  She also has a history of moderate LVH with increased turbulence in the LVOT but no dynamic gradient in the past.  This could be HOCM. I will repeat an echo.  2. HTN - Her BP is adequately controlled on exam today.  3. Heart murmur with history of moderate LVH but no dynamic outflow obstruction - I will get an echo to reassess. 4. PVC's- quite frequent on EKG and complains of occasional skipping.  I will get a Holter Monitor to assess PVC load.    Medication Adjustments/Labs and Tests Ordered: Current medicines are reviewed at length with the patient today.  Concerns regarding medicines are outlined above.  Medication changes, Labs and Tests ordered today are listed in the Patient  Instructions below.  Patient Instructions  Medication Instructions:  Your physician recommends that you continue on your current medications as directed. Please refer to the Current Medication list given to you today.   Labwork: None   Testing/Procedures: Your physician has requested that you have an echocardiogram. Echocardiography is a painless test that uses sound waves  to create images of your heart. It provides your doctor with information about the size and shape of your heart and how well your heart's chambers and valves are working. This procedure takes approximately one hour. There are no restrictions for this procedure.  Your physician has requested that you have a lexiscan myoview. For further information please visit [email or web address].[email or web address]. Please follow instruction sheet, as given.  Your physician has recommended that you wear a holter monitor. Holter monitors are medical devices that record the heart's electrical activity. Doctors most often use these monitors to diagnose arrhythmias. Arrhythmias are problems with the speed or rhythm of the heartbeat. The monitor is a small, portable device. You can wear one while you do your normal daily activities. This is usually used to diagnose what is causing palpitations/syncope (passing out).  24 HOUR   Follow-Up: Your physician wants you to follow-up in: 6 months with Dr Radford Pax. (November 2018).  You will receive a reminder letter in the mail two months in advance. If you don't receive a letter, please call our office to schedule the follow-up appointment.        If you need a refill on your cardiac medications before your next appointment, please call your pharmacy.      Signed, Fransico Him, MD  09/19/2016 2:18 PM    [facility] [address], [place], [place]  [address] Phone: [phone]; Fax: [phone]

## 2016-09-19 NOTE — Patient Instructions (Signed)
Medication Instructions:  Your physician recommends that you continue on your current medications as directed. Please refer to the Current Medication list given to you today.   Labwork: None   Testing/Procedures: Your physician has requested that you have an echocardiogram. Echocardiography is a painless test that uses sound waves to create images of your heart. It provides your doctor with information about the size and shape of your heart and how well your heart's chambers and valves are working. This procedure takes approximately one hour. There are no restrictions for this procedure.  Your physician has requested that you have a lexiscan myoview. For further information please visit [email or web address]. Please follow instruction sheet, as given.  Your physician has recommended that you wear a holter monitor. Holter monitors are medical devices that record the heart's electrical activity. Doctors most often use these monitors to diagnose arrhythmias. Arrhythmias are problems with the speed or rhythm of the heartbeat. The monitor is a small, portable device. You can wear one while you do your normal daily activities. This is usually used to diagnose what is causing palpitations/syncope (passing out).  24 HOUR   Follow-Up: Your physician wants you to follow-up in: 6 months with Dr Radford Pax. (November 2018).  You will receive a reminder letter in the mail two months in advance. If you don't receive a letter, please call our office to schedule the follow-up appointment.        If you need a refill on your cardiac medications before your next appointment, please call your pharmacy.

## 2016-09-21 ENCOUNTER — Telehealth: Payer: Self-pay

## 2016-09-21 NOTE — Telephone Encounter (Signed)
Routing to dr Ronnald Ramp, patient has tscore of 3.7---are you ok with patient starting prolia injections--please advise, I will call patient back to discuss, thanks

## 2016-09-21 NOTE — Telephone Encounter (Signed)
I will be verifying insurance for prolia injections and will call patient back to discuss prolia findings

## 2016-09-21 NOTE — Telephone Encounter (Signed)
yes

## 2016-09-25 DIAGNOSIS — R69 Illness, unspecified: Secondary | ICD-10-CM | POA: Diagnosis not present

## 2016-09-27 ENCOUNTER — Telehealth (HOSPITAL_COMMUNITY): Payer: Self-pay | Admitting: *Deleted

## 2016-09-27 NOTE — Telephone Encounter (Signed)
Left message on voicemail per DPR in reference to upcoming appointment scheduled on 09/29/16 with detailed instructions given per Myocardial Perfusion Study Information Sheet for the test. LM to arrive 15 minutes early, and that it is imperative to arrive on time for appointment to keep from having the test rescheduled. If you need to cancel or reschedule your appointment, please call the office within 24 hours of your appointment. Failure to do so may result in a cancellation of your appointment, and a $50 no show fee. Phone number given for call back for any questions. Tonya [name] [name]    

## 2016-09-28 ENCOUNTER — Telehealth: Payer: Self-pay | Admitting: Internal Medicine

## 2016-09-28 NOTE — Telephone Encounter (Signed)
Would like to know if you have recieved medical coverage for a medical device that was sent on 5/31

## 2016-09-28 NOTE — Telephone Encounter (Signed)
[employer].   Called pt back and pt stated that she did not call and that it must have been [employer].

## 2016-09-29 ENCOUNTER — Ambulatory Visit (HOSPITAL_COMMUNITY): Payer: Medicare HMO | Attending: Cardiovascular Disease

## 2016-09-29 DIAGNOSIS — R0602 Shortness of breath: Secondary | ICD-10-CM | POA: Diagnosis not present

## 2016-09-29 DIAGNOSIS — I10 Essential (primary) hypertension: Secondary | ICD-10-CM

## 2016-09-29 DIAGNOSIS — I519 Heart disease, unspecified: Secondary | ICD-10-CM | POA: Insufficient documentation

## 2016-09-29 DIAGNOSIS — I447 Left bundle-branch block, unspecified: Secondary | ICD-10-CM | POA: Diagnosis not present

## 2016-09-29 DIAGNOSIS — I493 Ventricular premature depolarization: Secondary | ICD-10-CM | POA: Diagnosis not present

## 2016-09-29 DIAGNOSIS — Z8673 Personal history of transient ischemic attack (TIA), and cerebral infarction without residual deficits: Secondary | ICD-10-CM | POA: Insufficient documentation

## 2016-09-29 DIAGNOSIS — R011 Cardiac murmur, unspecified: Secondary | ICD-10-CM | POA: Diagnosis not present

## 2016-09-29 LAB — MYOCARDIAL PERFUSION IMAGING
CHL CUP NUCLEAR SRS: 10
CHL CUP NUCLEAR SSS: 13
LHR: 0.34
LV dias vol: 60 mL (ref 46–106)
LV sys vol: 22 mL
NUC STRESS TID: 1.05
Peak HR: 90 {beats}/min
Rest HR: 48 {beats}/min
SDS: 3

## 2016-09-29 MED ORDER — TECHNETIUM TC 99M TETROFOSMIN IV KIT
30.6000 | PACK | Freq: Once | INTRAVENOUS | Status: AC | PRN
  Administered 2016-09-29: 30.6 via INTRAVENOUS
  Filled 2016-09-29: qty 31

## 2016-09-29 MED ORDER — REGADENOSON 0.4 MG/5ML IV SOLN
0.4000 mg | Freq: Once | INTRAVENOUS | Status: AC
  Administered 2016-09-29: 0.4 mg via INTRAVENOUS

## 2016-09-29 MED ORDER — TECHNETIUM TC 99M TETROFOSMIN IV KIT
10.1000 | PACK | Freq: Once | INTRAVENOUS | Status: AC | PRN
  Administered 2016-09-29: 10.1 via INTRAVENOUS
  Filled 2016-09-29: qty 11

## 2016-10-03 NOTE — Telephone Encounter (Signed)
[employer] called and would like to know if you have received the form   [phone] ask for [facility]

## 2016-10-04 NOTE — Telephone Encounter (Signed)
They faxed a new form.  New form completed and given to PCP to sign.

## 2016-10-09 ENCOUNTER — Ambulatory Visit (INDEPENDENT_AMBULATORY_CARE_PROVIDER_SITE_OTHER): Payer: Medicare HMO

## 2016-10-09 ENCOUNTER — Other Ambulatory Visit: Payer: Self-pay

## 2016-10-09 ENCOUNTER — Ambulatory Visit (HOSPITAL_COMMUNITY): Payer: Medicare HMO | Attending: Cardiology

## 2016-10-09 DIAGNOSIS — R011 Cardiac murmur, unspecified: Secondary | ICD-10-CM

## 2016-10-09 DIAGNOSIS — R0602 Shortness of breath: Secondary | ICD-10-CM | POA: Insufficient documentation

## 2016-10-09 DIAGNOSIS — Z8673 Personal history of transient ischemic attack (TIA), and cerebral infarction without residual deficits: Secondary | ICD-10-CM | POA: Insufficient documentation

## 2016-10-09 DIAGNOSIS — I10 Essential (primary) hypertension: Secondary | ICD-10-CM | POA: Diagnosis not present

## 2016-10-09 DIAGNOSIS — I119 Hypertensive heart disease without heart failure: Secondary | ICD-10-CM | POA: Insufficient documentation

## 2016-10-09 DIAGNOSIS — E785 Hyperlipidemia, unspecified: Secondary | ICD-10-CM | POA: Insufficient documentation

## 2016-10-09 DIAGNOSIS — J449 Chronic obstructive pulmonary disease, unspecified: Secondary | ICD-10-CM | POA: Diagnosis not present

## 2016-10-09 DIAGNOSIS — I348 Other nonrheumatic mitral valve disorders: Secondary | ICD-10-CM | POA: Insufficient documentation

## 2016-10-09 DIAGNOSIS — I493 Ventricular premature depolarization: Secondary | ICD-10-CM

## 2016-10-11 DIAGNOSIS — R69 Illness, unspecified: Secondary | ICD-10-CM | POA: Diagnosis not present

## 2016-10-18 NOTE — Telephone Encounter (Signed)
Pt daughter called and states [employer] never received the fax, please resend

## 2016-10-19 NOTE — Telephone Encounter (Signed)
Faxed completed.  

## 2016-10-28 DIAGNOSIS — R0602 Shortness of breath: Secondary | ICD-10-CM | POA: Diagnosis not present

## 2016-10-28 DIAGNOSIS — R011 Cardiac murmur, unspecified: Secondary | ICD-10-CM | POA: Diagnosis not present

## 2016-10-28 DIAGNOSIS — Z0000 Encounter for general adult medical examination without abnormal findings: Secondary | ICD-10-CM | POA: Diagnosis not present

## 2016-10-28 DIAGNOSIS — R42 Dizziness and giddiness: Secondary | ICD-10-CM | POA: Diagnosis not present

## 2016-10-28 DIAGNOSIS — G4700 Insomnia, unspecified: Secondary | ICD-10-CM | POA: Diagnosis not present

## 2016-10-28 DIAGNOSIS — G3184 Mild cognitive impairment, so stated: Secondary | ICD-10-CM | POA: Diagnosis not present

## 2016-10-28 DIAGNOSIS — I10 Essential (primary) hypertension: Secondary | ICD-10-CM | POA: Diagnosis not present

## 2016-10-28 DIAGNOSIS — N329 Bladder disorder, unspecified: Secondary | ICD-10-CM | POA: Diagnosis not present

## 2016-10-28 DIAGNOSIS — R002 Palpitations: Secondary | ICD-10-CM | POA: Diagnosis not present

## 2016-10-28 DIAGNOSIS — Z7982 Long term (current) use of aspirin: Secondary | ICD-10-CM | POA: Diagnosis not present

## 2016-10-28 DIAGNOSIS — I69354 Hemiplegia and hemiparesis following cerebral infarction affecting left non-dominant side: Secondary | ICD-10-CM | POA: Diagnosis not present

## 2016-11-02 ENCOUNTER — Telehealth: Payer: Self-pay | Admitting: *Deleted

## 2016-11-02 NOTE — Telephone Encounter (Signed)
Spoke with patient  .24  Holter monitor applied in our offfice 10/09/16.  As of 11/02/16, it has not been returned to our office.  Patient had stated daughter returned it 10/11/16.  Informed patient it has not been returned to our office.  She called daughter, who stated granddaughter was to bring it in.  She double checked and found she did have the monitor.  Patient apologize stated there had been death in family.  I let her know it was ok and I'm glad we were able to track it down.  They will return it to our office within the next few days.  I informed her our office will be closed 11/03/16 at 12:00PM, so they did not make an unnecessary trip.

## 2016-11-13 ENCOUNTER — Telehealth: Payer: Self-pay | Admitting: Internal Medicine

## 2016-11-13 NOTE — Telephone Encounter (Signed)
Patient states her cardiologist (Dr. Radford Pax) has suggested she stop two medications.  Patient states she is unsure what medications she is to stop.   Patient would like to know if Dr. Ronnald Ramp recommends her to stop the two medications.  Patient states that she does not know if there is anything wrong with her heart or not.  I have asked patient if she has not consulted back with her cardiologist in regard to her questions.  Patient states Dr. Radford Pax had got upset with her on her last visit and that she was not going to go back to see Dr. Radford Pax.  Patient would like to know if Dr. Ronnald Ramp has received progress reports from her last OV with Dr. Radford Pax.  I have asked patient to schedule an OV but patient does not want to schedule appt.   Can you please follow up with patient in regard and referral to new cardiologist?

## 2016-11-15 NOTE — Telephone Encounter (Signed)
Patient states the only transportation she has is her daughter.  States her daughter is currently moving and does not know when she will be able to get in.  Patient stated she would get her daughter to call our office to schedule an appt.

## 2016-11-15 NOTE — Telephone Encounter (Signed)
Pt has to have an appt. Will you try to get her to schedule one. This will need to be a 30 min slot.

## 2016-11-20 ENCOUNTER — Telehealth: Payer: Self-pay

## 2016-11-20 ENCOUNTER — Ambulatory Visit: Payer: Medicare HMO | Admitting: Internal Medicine

## 2016-11-20 NOTE — Telephone Encounter (Signed)
Insurance was verified to start prolia injections---estimated $220 copay per injection---patient does not want to start, does not like shots and does not want the risk of any side effects from prolia---I have archived patient from prolia portal---patient advised to be really careful when walking/performing ADL's because of high risk of fracture if she were to fall

## 2016-11-21 ENCOUNTER — Telehealth: Payer: Self-pay | Admitting: Internal Medicine

## 2016-11-21 DIAGNOSIS — I69359 Hemiplegia and hemiparesis following cerebral infarction affecting unspecified side: Secondary | ICD-10-CM

## 2016-11-21 DIAGNOSIS — M5431 Sciatica, right side: Secondary | ICD-10-CM

## 2016-11-21 DIAGNOSIS — M5416 Radiculopathy, lumbar region: Secondary | ICD-10-CM

## 2016-11-21 NOTE — Telephone Encounter (Addendum)
Are you okay with the PT referral to [facility]

## 2016-11-21 NOTE — Telephone Encounter (Signed)
Pt called stating that she is needing a prescription for PT and treatment sent to [facility] to program the [employer] leg brace/walk aid. Can this be sent over for her? She was told to call us to request this order.

## 2016-11-21 NOTE — Telephone Encounter (Signed)
yes

## 2016-11-22 NOTE — Telephone Encounter (Signed)
Referral entered  

## 2016-11-23 ENCOUNTER — Encounter: Payer: Self-pay | Admitting: Internal Medicine

## 2016-11-23 ENCOUNTER — Other Ambulatory Visit (INDEPENDENT_AMBULATORY_CARE_PROVIDER_SITE_OTHER): Payer: Medicare HMO

## 2016-11-23 ENCOUNTER — Ambulatory Visit (INDEPENDENT_AMBULATORY_CARE_PROVIDER_SITE_OTHER): Payer: Medicare HMO | Admitting: Internal Medicine

## 2016-11-23 VITALS — BP 138/74 | HR 68 | Temp 98.4°F | Resp 16 | Ht 71.5 in | Wt 193.2 lb

## 2016-11-23 DIAGNOSIS — D508 Other iron deficiency anemias: Secondary | ICD-10-CM

## 2016-11-23 DIAGNOSIS — I493 Ventricular premature depolarization: Secondary | ICD-10-CM | POA: Diagnosis not present

## 2016-11-23 DIAGNOSIS — I10 Essential (primary) hypertension: Secondary | ICD-10-CM

## 2016-11-23 DIAGNOSIS — R739 Hyperglycemia, unspecified: Secondary | ICD-10-CM | POA: Diagnosis not present

## 2016-11-23 LAB — CBC WITH DIFFERENTIAL/PLATELET
BASOS ABS: 0.1 10*3/uL (ref 0.0–0.1)
Basophils Relative: 1.2 % (ref 0.0–3.0)
Eosinophils Absolute: 0.1 10*3/uL (ref 0.0–0.7)
Eosinophils Relative: 2.3 % (ref 0.0–5.0)
HCT: 37.1 % (ref 36.0–46.0)
Hemoglobin: 11.7 g/dL — ABNORMAL LOW (ref 12.0–15.0)
LYMPHS ABS: 1.8 10*3/uL (ref 0.7–4.0)
Lymphocytes Relative: 37.6 % (ref 12.0–46.0)
MCHC: 31.6 g/dL (ref 30.0–36.0)
MCV: 88.5 fl (ref 78.0–100.0)
MONO ABS: 0.4 10*3/uL (ref 0.1–1.0)
MONOS PCT: 8.1 % (ref 3.0–12.0)
NEUTROS PCT: 50.8 % (ref 43.0–77.0)
Neutro Abs: 2.5 10*3/uL (ref 1.4–7.7)
Platelets: 244 10*3/uL (ref 150.0–400.0)
RBC: 4.19 Mil/uL (ref 3.87–5.11)
RDW: 13.3 % (ref 11.5–15.5)
WBC: 4.9 10*3/uL (ref 4.0–10.5)

## 2016-11-23 LAB — BASIC METABOLIC PANEL
BUN: 12 mg/dL (ref 6–23)
CALCIUM: 9.8 mg/dL (ref 8.4–10.5)
CHLORIDE: 105 meq/L (ref 96–112)
CO2: 31 mEq/L (ref 19–32)
CREATININE: 0.8 mg/dL (ref 0.40–1.20)
GFR: 89.09 mL/min (ref >60.00)
Glucose, Bld: 161 mg/dL — ABNORMAL HIGH (ref 70–99)
Potassium: 4 mEq/L (ref 3.5–5.1)
SODIUM: 141 meq/L (ref 135–145)

## 2016-11-23 LAB — HEMOGLOBIN A1C: HEMOGLOBIN A1C: 6.2 % (ref 4.6–6.5)

## 2016-11-23 MED ORDER — DILTIAZEM HCL ER COATED BEADS 180 MG PO CP24: 180.0000 mg | ORAL_CAPSULE | Freq: Every day | ORAL | 1 refills | Status: DC

## 2016-11-23 MED ORDER — METOPROLOL TARTRATE 25 MG PO TABS: 25.0000 mg | ORAL_TABLET | Freq: Two times a day (BID) | ORAL | 1 refills | Status: DC

## 2016-11-23 NOTE — Progress Notes (Signed)
Subjective:  Patient ID: Tonya [name], female    DOB: 1941-04-15  Age: 76 y.o. MRN: [id number]  CC: Hypertension   HPI Tonya [name] presents for f/up - She has completed a workup of palpitations with cardiology and was found to have a high load of PVCs. She was asked to change from her current hypertensive regimen to metoprolol and diltiazem. She has not done this yet because she wanted to see me first. She continues to complain of palpitations and a few episodes of dizziness. She also has chronic pedal edema that has not worsened recently.  Outpatient Medications Prior to Visit  Medication Sig Dispense Refill  . aspirin 81 MG tablet Take 81 mg by mouth daily.      . Calcium Carbonate-Vitamin D (CALCIUM 600 + D PO) Take by mouth daily.      . Cholecalciferol 2000 UNITS TABS Take 1 tablet (2,000 Units total) by mouth daily. 90 tablet 3  . cholestyramine (QUESTRAN) 4 G packet DISSOLVE 1 PACKET INTO 2 TO 6 OUNCES OF WATER AND DRINK 2 TIMES A DAY. 60 packet 11  . cyanocobalamin 2000 MCG tablet Take 1 tablet (2,000 mcg total) by mouth daily. 90 tablet 3  . hydrochlorothiazide (HYDRODIURIL) 25 MG tablet TAKE 1 TABLET DAILY FOR    BLOOD PRESSURE AND SWELLING 90 tablet 3  . Niacin CR 250 MG TBCR Take 1 tablet (250 mg total) by mouth daily. 90 tablet 3  . simvastatin (ZOCOR) 20 MG tablet TAKE 1 TABLET AT BEDTIME 90 tablet 3  . amLODipine-valsartan (EXFORGE) 5-160 MG tablet TAKE 1 TABLET DAILY 90 tablet 3  . carvedilol (COREG) 6.25 MG tablet TAKE 1 TABLET TWICE A DAY  WITH A MEAL 180 tablet 1   No facility-administered medications prior to visit.     ROS Review of Systems  Constitutional: Negative.  Negative for chills, diaphoresis, fatigue and unexpected weight change.  HENT: Negative.  Negative for trouble swallowing.   Eyes: Negative for visual disturbance.  Respiratory: Negative for cough, chest tightness, shortness of breath and wheezing.   Cardiovascular: Positive for palpitations  and leg swelling. Negative for chest pain.  Gastrointestinal: Negative for abdominal pain, constipation, diarrhea, nausea and vomiting.  Endocrine: Negative.   Genitourinary: Negative.  Negative for difficulty urinating.  Musculoskeletal: Negative.  Negative for arthralgias, back pain, myalgias and neck pain.  Skin: Negative.  Negative for color change.  Allergic/Immunologic: Negative.   Neurological: Negative.  Negative for dizziness.  Hematological: Negative for adenopathy. Does not bruise/bleed easily.  Psychiatric/Behavioral: Negative.     Objective:  BP 138/74 (BP Location: Right Arm, Patient Position: Sitting, Cuff Size: Normal)   Pulse 68   Temp 98.4 F (36.9 C) (Oral)   Resp 16   Ht 5' 11.5" (1.816 m)   Wt 193 lb 4 oz (87.7 kg)   SpO2 98%   BMI 26.58 kg/m   BP Readings from Last 3 Encounters:  11/23/16 138/74  09/19/16 136/82  06/28/16 140/78    Wt Readings from Last 3 Encounters:  11/23/16 193 lb 4 oz (87.7 kg)  09/19/16 190 lb (86.2 kg)  06/28/16 191 lb 12 oz (87 kg)    Physical Exam  Constitutional: She is oriented to person, place, and time. No distress.  HENT:  Mouth/Throat: Oropharynx is clear and moist. No oropharyngeal exudate.  Eyes: Conjunctivae are normal. Right eye exhibits no discharge. Left eye exhibits no discharge. No scleral icterus.  Neck: Normal range of motion. Neck supple. No JVD  present. No thyromegaly present.  Cardiovascular: Normal rate, regular rhythm and intact distal pulses.   Occasional extrasystoles are present. Exam reveals no gallop.   No murmur heard. Pulmonary/Chest: Effort normal and breath sounds normal. No respiratory distress. She has no wheezes. She has no rales. She exhibits no tenderness.  Abdominal: Soft. Bowel sounds are normal. She exhibits no distension and no mass. There is no tenderness. There is no rebound and no guarding.  Musculoskeletal: She exhibits edema. She exhibits no tenderness or deformity.  1+ pitting  edema in both feet  Lymphadenopathy:    She has no cervical adenopathy.  Neurological: She is alert and oriented to person, place, and time.  Skin: Skin is warm and dry. No rash noted. She is not diaphoretic. No erythema. No pallor.  Vitals reviewed.   Lab Results  Component Value Date   WBC 4.9 11/23/2016   HGB 11.7 (L) 11/23/2016   HCT 37.1 11/23/2016   PLT 244.0 11/23/2016   GLUCOSE 161 (H) 11/23/2016   CHOL 159 01/31/2016   TRIG 110.0 01/31/2016   HDL 46.70 01/31/2016   LDLCALC 90 01/31/2016   ALT 12 01/31/2016   AST 16 01/31/2016   NA 141 11/23/2016   K 4.0 11/23/2016   CL 105 11/23/2016   CREATININE 0.80 11/23/2016   BUN 12 11/23/2016   CO2 31 11/23/2016   TSH 3.12 01/31/2016   HGBA1C 6.2 11/23/2016    Mm Screening Breast Tomo Bilateral  Result Date: 07/06/2016 CLINICAL DATA:  Screening. EXAM: 2D DIGITAL SCREENING BILATERAL MAMMOGRAM WITH CAD AND ADJUNCT TOMO COMPARISON:  Previous exam(s). ACR Breast Density Category b: There are scattered areas of fibroglandular density. FINDINGS: There are no findings suspicious for malignancy. Images were processed with CAD. IMPRESSION: No mammographic evidence of malignancy. A result letter of this screening mammogram will be mailed directly to the patient. RECOMMENDATION: Screening mammogram in one year. (Code:SM-B-01Y) BI-RADS CATEGORY  1: Negative. Electronically Signed   By: Abelardo Diesel M.D.   On: 07/06/2016 15:14    Assessment & Plan:   Tonya [name] was seen today for hypertension.  Diagnoses and all orders for this visit:  Essential hypertension- her blood pressure is well-controlled, electrolytes and renal function are normal. -     diltiazem (CARDIZEM CD) 180 MG 24 hr capsule; Take 1 capsule (180 mg total) by mouth daily. -     metoprolol tartrate (LOPRESSOR) 25 MG tablet; Take 1 tablet (25 mg total) by mouth 2 (two) times daily. -     Basic metabolic panel; Future  Frequent PVCs- she agrees to make the changes recommended  by cardiology. -     diltiazem (CARDIZEM CD) 180 MG 24 hr capsule; Take 1 capsule (180 mg total) by mouth daily. -     metoprolol tartrate (LOPRESSOR) 25 MG tablet; Take 1 tablet (25 mg total) by mouth 2 (two) times daily.  Other iron deficiency anemia- improvement noted, I've asked her to continue iron replacement therapy. -     CBC with Differential/Platelet; Future  Hyperglycemia- her A1c is 6.2%. Her blood sugars are adequately well controlled. Medical therapy is not indicated. -     Basic metabolic panel; Future -     Hemoglobin A1c; Future   I have discontinued Ms. Zagal's amLODipine-valsartan and carvedilol. I am also having her start on diltiazem and metoprolol tartrate. Additionally, I am having her maintain her aspirin, Calcium Carbonate-Vitamin D (CALCIUM 600 + D PO), Cholecalciferol, cholestyramine, cyanocobalamin, Niacin CR, hydrochlorothiazide, and simvastatin.  Meds ordered  this encounter  Medications  . diltiazem (CARDIZEM CD) 180 MG 24 hr capsule    Sig: Take 1 capsule (180 mg total) by mouth daily.    Dispense:  90 capsule    Refill:  1  . metoprolol tartrate (LOPRESSOR) 25 MG tablet    Sig: Take 1 tablet (25 mg total) by mouth 2 (two) times daily.    Dispense:  180 tablet    Refill:  1     Follow-up: Return in about 2 months (around 01/23/2017).  Scarlette Calico, MD

## 2016-11-23 NOTE — Patient Instructions (Signed)

## 2016-11-29 ENCOUNTER — Telehealth: Payer: Self-pay | Admitting: Internal Medicine

## 2016-11-29 NOTE — Telephone Encounter (Signed)
Pt called in and has questions about her meds.  She is unclear as to which one she should be taking and not taking

## 2016-11-30 NOTE — Telephone Encounter (Signed)
Called pt concerning message below. She states she did not have the cholecalciferol and cyanocobalamin that MD have on her paper to take, and not sure what it was. Inform pt the cholecalciferol is vitamin D, and cyanocobalamin is Vit B12 they are just listed in generic names. Pt states she did not know she is currently taking the vitamin b12, will have to buy some vitamin D..../lmb

## 2016-12-11 DIAGNOSIS — I69354 Hemiplegia and hemiparesis following cerebral infarction affecting left non-dominant side: Secondary | ICD-10-CM | POA: Diagnosis not present

## 2016-12-19 DIAGNOSIS — I69354 Hemiplegia and hemiparesis following cerebral infarction affecting left non-dominant side: Secondary | ICD-10-CM | POA: Diagnosis not present

## 2017-01-22 NOTE — Progress Notes (Signed)
Subjective:    Patient ID: Tonya [name], female    DOB: 1940/06/18, 76 y.o.   MRN: [id number]  HPI She is here for an acute visit.   Pain in legs:  It started one month ago.  She has pain in the lower back seldomly.  She has pain in her right or left hip.  She has pain in her thighs.  It alternates sides - it is not in both legs at the same time.  It feels like tooth ache or vibrating pain.  She denies numbness/tingling. She has mild weakness in her left leg from her previous stroke, which has not changed - no new weakness.  She has had sciatica in the past and this feels similar.    Taking 1 advil yesterday helped.  She tried aleve, but it did not help.  Her pain level now is 1/10 at rest.  This week her pain has been 2/week this week, but it was a 10/10 last week.  Last year she did PT for her sciatica and she knows what exercises to do.   MRI lumbar spine  01/2016:  IMPRESSION: 1. Progressed chronic disc and posterior element degeneration at L4-L5 since 2010, now with moderate to severe spinal and left greater than right lateral recess stenosis. Moderate biforaminal stenosis, although improved on the right owing to interval regressed right foraminal disc herniation. 2. Progressed chronic disc degeneration at L3-L4 with increased moderate to severe left lateral recess stenosis, mild spinal stenosis, and mild to moderate left foraminal stenosis. 3. Stable advanced chronic disc and endplate degeneration at L5-S1 with moderate to severe left greater than right lateral recess and moderate left foraminal stenosis. 4. Mild endplate marrow edema at L4-L5, in part related to an L4 inferior endplate Schmorl node which is new since 2010.    Medications and allergies reviewed with patient and updated if appropriate.  Patient Active Problem List   Diagnosis Date Noted  . Frequent PVCs 11/23/2016  . Heart murmur 09/19/2016  . Iron deficiency anemia 05/17/2016  . Sciatica of right side  01/31/2016  . Chronic right hip pain 06/08/2015  . Nonspecific abnormal electrocardiogram (ECG) (EKG) 02/23/2015  . Bradycardia 10/20/2014  . Routine general medical examination at a health care facility 01/02/2013  . Hyperglycemia 07/18/2011  . Hypertonicity of bladder 12/20/2009  . Hyperlipidemia with target LDL less than 100 12/14/2008  . GERD 11/03/2008  . Right lumbar radiculitis 05/18/2008  . Essential hypertension 07/26/2007  . COPD 07/26/2007  . CVA, old, hemiparesis ([place]) 07/26/2007    Current Outpatient Prescriptions on File Prior to Visit  Medication Sig Dispense Refill  . aspirin 81 MG tablet Take 81 mg by mouth daily.      . Calcium Carbonate-Vitamin D (CALCIUM 600 + D PO) Take by mouth daily.      . Cholecalciferol 2000 UNITS TABS Take 1 tablet (2,000 Units total) by mouth daily. 90 tablet 3  . cholestyramine (QUESTRAN) 4 G packet DISSOLVE 1 PACKET INTO 2 TO 6 OUNCES OF WATER AND DRINK 2 TIMES A DAY. 60 packet 11  . cyanocobalamin 2000 MCG tablet Take 1 tablet (2,000 mcg total) by mouth daily. 90 tablet 3  . diltiazem (CARDIZEM CD) 180 MG 24 hr capsule Take 1 capsule (180 mg total) by mouth daily. 90 capsule 1  . hydrochlorothiazide (HYDRODIURIL) 25 MG tablet TAKE 1 TABLET DAILY FOR    BLOOD PRESSURE AND SWELLING 90 tablet 3  . metoprolol tartrate (LOPRESSOR) 25 MG tablet  Take 1 tablet (25 mg total) by mouth 2 (two) times daily. 180 tablet 1  . Niacin CR 250 MG TBCR Take 1 tablet (250 mg total) by mouth daily. 90 tablet 3  . simvastatin (ZOCOR) 20 MG tablet TAKE 1 TABLET AT BEDTIME 90 tablet 3   No current facility-administered medications on file prior to visit.     Past Medical History:  Diagnosis Date  . Cerebrovascular accident ([place])   . Chronic edema    ? venous insufficiency  . COPD (chronic obstructive pulmonary disease) ([place])    never had a problem breathing  . Enlarged heart    per pt  . Gallstones   . GERD (gastroesophageal reflux disease)   .  Hyperlipidemia   . Hypertension     Past Surgical History:  Procedure Laterality Date  . ABDOMINAL HYSTERECTOMY    . CHOLECYSTECTOMY      Social History   Social History  . Marital status: Widowed    Spouse name: N/A  . Number of children: N/A  . Years of education: N/A   Social History Main Topics  . Smoking status: Never Smoker  . Smokeless tobacco: Never Used  . Alcohol use No  . Drug use: No  . Sexual activity: Not Currently   Other Topics Concern  . None   Social History Narrative  . None    Family History  Problem Relation Age of Onset  . Cirrhosis Mother   . Heart disease Father   . Cancer Neg Hx        colon    Review of Systems  Constitutional: Negative for chills and fever.  Genitourinary:       No change in urination  Musculoskeletal: Positive for back pain, gait problem (chronic) and myalgias.  Neurological: Positive for weakness (chronic, no change). Negative for numbness.       Objective:   Vitals:   01/23/17 1013  BP: (!) 144/76  Pulse: 68  Resp: 16  Temp: 98 F (36.7 C)  SpO2: 98%   Filed Weights   01/23/17 1013  Weight: 189 lb (85.7 kg)   Body mass index is 25.99 kg/m.  Wt Readings from Last 3 Encounters:  01/23/17 189 lb (85.7 kg)  11/23/16 193 lb 4 oz (87.7 kg)  09/19/16 190 lb (86.2 kg)     Physical Exam  Constitutional: She is oriented to person, place, and time. She appears well-developed and well-nourished. No distress.  HENT:  Head: Normocephalic and atraumatic.  Musculoskeletal: She exhibits no edema, tenderness or deformity.  No tenderness with palpation in lumbar region of spine, decreased pain or pressure and back with flexion of spine  Neurological: She is alert and oriented to person, place, and time.  Normal sensation bilateral lower extremities, strength is at her baseline with 2/5 left lower extremity and 5/5 right lower extremity, positive straight leg raise  Skin: Skin is warm and dry. She is not  diaphoretic.          Assessment & Plan:   See Problem List for Assessment and Plan of chronic medical problems.

## 2017-01-23 ENCOUNTER — Encounter: Payer: Self-pay | Admitting: Internal Medicine

## 2017-01-23 ENCOUNTER — Ambulatory Visit (INDEPENDENT_AMBULATORY_CARE_PROVIDER_SITE_OTHER): Payer: Medicare HMO | Admitting: Internal Medicine

## 2017-01-23 VITALS — BP 144/76 | HR 68 | Temp 98.0°F | Resp 16 | Wt 189.0 lb

## 2017-01-23 DIAGNOSIS — M48061 Spinal stenosis, lumbar region without neurogenic claudication: Secondary | ICD-10-CM

## 2017-01-23 MED ORDER — MELOXICAM 7.5 MG PO TABS: 7.5000 mg | ORAL_TABLET | Freq: Every day | ORAL | 0 refills | Status: DC

## 2017-01-23 NOTE — Patient Instructions (Addendum)
Your leg pain is related to your back issues.  Start taking meloxicam daily with food for up to two weeks.  If you have any stomach upset or heartburn stop the medication.  Do not take any advil or aleve while taking this medication.   Do your back exercises at home  Call if no improvement consider PT or seeing a back specialist.

## 2017-01-23 NOTE — Assessment & Plan Note (Signed)
Flare of back pain related to spinal stenosis and degenerative changes in  MRI reviewed from last year Has radiating pain to bilateral legs to knees, no neurologic deficits on exam Except for chronic deficits and stroke She was started the physical therapy exercises at home, defer referral for physical therapy Start meloxicam 7.5 mg daily for up to 14 days only-stop if she has any stomach upset No Advil or Aleve while taking meloxicam Deferred seeing a specialist at this time Call if no improvement

## 2017-04-06 ENCOUNTER — Telehealth: Payer: Self-pay | Admitting: Internal Medicine

## 2017-04-06 DIAGNOSIS — I10 Essential (primary) hypertension: Secondary | ICD-10-CM

## 2017-04-06 DIAGNOSIS — I493 Ventricular premature depolarization: Secondary | ICD-10-CM

## 2017-04-06 MED ORDER — METOPROLOL TARTRATE 25 MG PO TABS: 25.0000 mg | ORAL_TABLET | Freq: Two times a day (BID) | ORAL | 1 refills | Status: DC

## 2017-04-06 MED ORDER — HYDROCHLOROTHIAZIDE 25 MG PO TABS: 25.0000 mg | ORAL_TABLET | Freq: Every day | ORAL | 1 refills | Status: DC

## 2017-04-06 MED ORDER — DILTIAZEM HCL ER COATED BEADS 180 MG PO CP24: 180.0000 mg | ORAL_CAPSULE | Freq: Every day | ORAL | 1 refills | Status: DC

## 2017-04-06 MED ORDER — METOPROLOL TARTRATE 25 MG PO TABS: 25.0000 mg | ORAL_TABLET | Freq: Two times a day (BID) | ORAL | 0 refills | Status: DC

## 2017-04-06 NOTE — Telephone Encounter (Signed)
Med list updates. erx sent as requested.

## 2017-04-06 NOTE — Telephone Encounter (Signed)
Patient is requesting medication refills.  Patient does not want to take Niacin or simvastatin anymore.  Patient has hydrochlorothiazide and diltiazem but almost out.  Patient states she has been out of metoprolol for two months and needs right away.  Is requesting all meds to be sent to [facility] order with a small supply of the metoprolol to be sent to [facility].

## 2017-06-17 NOTE — Progress Notes (Signed)
Subjective:    Patient ID: Tonya [name], female    DOB: Jul 06, 1940, 77 y.o.   MRN: [id number]  HPI The patient is here for an acute visit for elevated BP.  Hypertension: She is taking her medication daily. She is compliant with a low sodium diet.  She denies chest pain, palpitations, edema, shortness of breath and regular headaches.  She does monitor her blood pressure at home - 145-150's/ 80.  She feels her blood pressure has been higher over the past year.  Spinal stenosis of lumbar region: I saw her back in October he did prescribe meloxicam, which she tolerated well.  She states the medication has helped.  She is only taking this as needed.  She does not feel that this has elevated her blood pressure.  She would like a refill of the medication and will only take it as needed.   Medications and allergies reviewed with patient and updated if appropriate.  Patient Active Problem List   Diagnosis Date Noted  . Spinal stenosis of lumbar region 01/23/2017  . Frequent PVCs 11/23/2016  . Heart murmur 09/19/2016  . Iron deficiency anemia 05/17/2016  . Sciatica of right side 01/31/2016  . Chronic right hip pain 06/08/2015  . Nonspecific abnormal electrocardiogram (ECG) (EKG) 02/23/2015  . Bradycardia 10/20/2014  . Routine general medical examination at a health care facility 01/02/2013  . Hyperglycemia 07/18/2011  . Hypertonicity of bladder 12/20/2009  . Hyperlipidemia with target LDL less than 100 12/14/2008  . GERD 11/03/2008  . Right lumbar radiculitis 05/18/2008  . Essential hypertension 07/26/2007  . COPD 07/26/2007  . CVA, old, hemiparesis ([place]) 07/26/2007    Current Outpatient Medications on File Prior to Visit  Medication Sig Dispense Refill  . aspirin 81 MG tablet Take 81 mg by mouth daily.      . Calcium Carbonate-Vitamin D (CALCIUM 600 + D PO) Take by mouth daily.      . Cholecalciferol 2000 UNITS TABS Take 1 tablet (2,000 Units total) by mouth daily. 90 tablet 3  .  cholestyramine (QUESTRAN) 4 G packet DISSOLVE 1 PACKET INTO 2 TO 6 OUNCES OF WATER AND DRINK 2 TIMES A DAY. 60 packet 11  . cyanocobalamin 2000 MCG tablet Take 1 tablet (2,000 mcg total) by mouth daily. 90 tablet 3  . diltiazem (CARDIZEM CD) 180 MG 24 hr capsule Take 1 capsule (180 mg total) by mouth daily. 90 capsule 1  . hydrochlorothiazide (HYDRODIURIL) 25 MG tablet Take 1 tablet (25 mg total) by mouth daily. 90 tablet 1  . metoprolol tartrate (LOPRESSOR) 25 MG tablet Take 1 tablet (25 mg total) by mouth 2 (two) times daily. 60 tablet 0   No current facility-administered medications on file prior to visit.     Past Medical History:  Diagnosis Date  . Cerebrovascular accident ([place])   . Chronic edema    ? venous insufficiency  . COPD (chronic obstructive pulmonary disease) ([place])    never had a problem breathing  . Enlarged heart    per pt  . Gallstones   . GERD (gastroesophageal reflux disease)   . Hyperlipidemia   . Hypertension     Past Surgical History:  Procedure Laterality Date  . ABDOMINAL HYSTERECTOMY    . CHOLECYSTECTOMY      Social History   Socioeconomic History  . Marital status: Widowed    Spouse name: None  . Number of children: None  . Years of education: None  . Highest education level: None  Social Needs  . Financial resource strain: None  . Food insecurity - worry: None  . Food insecurity - inability: None  . Transportation needs - medical: None  . Transportation needs - non-medical: None  Occupational History  . None  Tobacco Use  . Smoking status: Never Smoker  . Smokeless tobacco: Never Used  Substance and Sexual Activity  . Alcohol use: No    Alcohol/week: 0.0 oz  . Drug use: No  . Sexual activity: Not Currently  Other Topics Concern  . None  Social History Narrative  . None    Family History  Problem Relation Age of Onset  . Cirrhosis Mother   . Heart disease Father   . Cancer Neg Hx        colon    Review of Systems    Constitutional: Negative for chills and fever.  Respiratory: Negative for cough, shortness of breath and wheezing.   Cardiovascular: Positive for leg swelling (chornic LLE edmea). Negative for chest pain and palpitations.  Neurological: Positive for dizziness (occ). Negative for light-headedness and headaches.       Objective:   Vitals:   06/18/17 1047  BP: (!) 150/78  Pulse: (!) 57  Resp: 16  Temp: 97.8 F (36.6 C)  SpO2: 98%   Wt Readings from Last 3 Encounters:  06/18/17 191 lb (86.6 kg)  01/23/17 189 lb (85.7 kg)  11/23/16 193 lb 4 oz (87.7 kg)   Body mass index is 26.27 kg/m.   Physical Exam    Constitutional: Appears well-developed and well-nourished. No distress.  HENT:  Head: Normocephalic and atraumatic.  Neck: Neck supple. No tracheal deviation present. No thyromegaly present.  No cervical lymphadenopathy Cardiovascular: Normal rate, regular rhythm and normal heart sounds.   2/6 systolic murmur heard. No carotid bruit .  No edema Pulmonary/Chest: Effort normal and breath sounds normal. No respiratory distress. No has no wheezes. No rales.  Skin: Skin is warm and dry. Not diaphoretic.  Psychiatric: Normal mood and affect. Behavior is normal.       Assessment & Plan:    See Problem List for Assessment and Plan of chronic medical problems.

## 2017-06-18 ENCOUNTER — Encounter: Payer: Self-pay | Admitting: Internal Medicine

## 2017-06-18 ENCOUNTER — Ambulatory Visit: Payer: Medicare HMO | Admitting: Internal Medicine

## 2017-06-18 VITALS — BP 150/78 | HR 57 | Temp 97.8°F | Resp 16 | Wt 191.0 lb

## 2017-06-18 DIAGNOSIS — M48061 Spinal stenosis, lumbar region without neurogenic claudication: Secondary | ICD-10-CM | POA: Diagnosis not present

## 2017-06-18 DIAGNOSIS — I10 Essential (primary) hypertension: Secondary | ICD-10-CM | POA: Diagnosis not present

## 2017-06-18 MED ORDER — TELMISARTAN 20 MG PO TABS: 20.0000 mg | ORAL_TABLET | Freq: Every day | ORAL | 5 refills | Status: DC

## 2017-06-18 MED ORDER — HYDROCHLOROTHIAZIDE 25 MG PO TABS: 25.0000 mg | ORAL_TABLET | Freq: Every day | ORAL | 1 refills | Status: DC

## 2017-06-18 MED ORDER — MELOXICAM 7.5 MG PO TABS: 7.5000 mg | ORAL_TABLET | Freq: Every day | ORAL | 0 refills | Status: DC | PRN

## 2017-06-18 NOTE — Assessment & Plan Note (Signed)
Experiencing intermittent lower back and leg pain Meloxicam 7.5 mg daily as needed has been helpful and I will continue Due for blood work-we will follow-up with PCP in approximately 4 weeks Advised her to only take this as needed and discussed possible side effects Refill sent to pharmacy

## 2017-06-18 NOTE — Patient Instructions (Addendum)
Medications reviewed and updated.  Changes include starting a new blood pressure medication called telmisartan.    Your prescription(s) have been submitted to your pharmacy. Please take as directed and contact our office if you believe you are having problem(s) with the medication(s).   Please followup with Dr Ronnald Ramp in one month

## 2017-06-18 NOTE — Assessment & Plan Note (Addendum)
Not controlled - slightly elevated here and at home Continue hydrochlorothiazide, metoprolol and diltiazem at current doses We will start Micardis 20 mg daily if covered by insurance She will continue to monitor her blood pressure at home Follow-up with PCP in approximately 4 weeks

## 2017-06-27 ENCOUNTER — Telehealth: Payer: Self-pay | Admitting: Internal Medicine

## 2017-06-27 NOTE — Telephone Encounter (Signed)
Copied from [place]. Topic: Quick Communication - See Telephone Encounter >> Jun 27, 2017  2:55 PM Lolita Rieger, RMA wrote: CRM for notification. See Telephone encounter for:   06/27/17.pt would like a call to discuss the medications that she was placed on during her last visit Please call pt [phone] 

## 2017-06-28 ENCOUNTER — Telehealth: Payer: Self-pay | Admitting: Internal Medicine

## 2017-06-28 NOTE — Telephone Encounter (Signed)
Copied from [place]. Topic: Quick Communication - See Telephone Encounter >> Jun 28, 2017  4:37 PM Cleaster Corin, NT wrote: CRM for notification. See Telephone encounter for:   06/28/17.  hydrochlorothiazide (HYDRODIURIL) 25 MG tablet [[id number]]  meloxicam (MOBIC) 7.5 MG tablet [[id number]]  telmisartan (MICARDIS) 20 MG tablet [[id number]  Pt. Daughter calling and stating that pt. has questions and would like to speak with Dr.Burns Or nurse prescribed meds. listed

## 2017-06-29 NOTE — Telephone Encounter (Signed)
Spoke with pt to clarify that she is still taking 4 BP/ heart/ fluid meds.

## 2017-06-29 NOTE — Telephone Encounter (Signed)
Copied from [place] [id number]. Topic: Quick Communication - Rx Refill/Question >> Jun 29, 2017 10:35 AM Neva Seat wrote: Telmisartan  20mg   Pt is unclear on directions to take Rx. Please call pt to advise.

## 2017-06-29 NOTE — Telephone Encounter (Signed)
Pt has already been contacted by Dr. Quay Burow assistant and is satisfied with the advise.

## 2017-06-29 NOTE — Telephone Encounter (Signed)
Pt questioning if she should continue all her other medications since Telmisartan started. Pt advised to take medication in addition to her other B/P meds. as ordered by PCP.

## 2017-07-17 ENCOUNTER — Ambulatory Visit (INDEPENDENT_AMBULATORY_CARE_PROVIDER_SITE_OTHER): Payer: Medicare HMO | Admitting: Internal Medicine

## 2017-07-17 ENCOUNTER — Encounter: Payer: Self-pay | Admitting: Internal Medicine

## 2017-07-17 ENCOUNTER — Other Ambulatory Visit (INDEPENDENT_AMBULATORY_CARE_PROVIDER_SITE_OTHER): Payer: Medicare HMO

## 2017-07-17 VITALS — BP 160/80 | HR 94 | Temp 98.3°F | Resp 16 | Ht 71.0 in | Wt 190.0 lb

## 2017-07-17 DIAGNOSIS — R739 Hyperglycemia, unspecified: Secondary | ICD-10-CM

## 2017-07-17 DIAGNOSIS — I10 Essential (primary) hypertension: Secondary | ICD-10-CM

## 2017-07-17 DIAGNOSIS — E118 Type 2 diabetes mellitus with unspecified complications: Secondary | ICD-10-CM

## 2017-07-17 DIAGNOSIS — E785 Hyperlipidemia, unspecified: Secondary | ICD-10-CM

## 2017-07-17 DIAGNOSIS — E559 Vitamin D deficiency, unspecified: Secondary | ICD-10-CM | POA: Diagnosis not present

## 2017-07-17 DIAGNOSIS — D508 Other iron deficiency anemias: Secondary | ICD-10-CM

## 2017-07-17 DIAGNOSIS — R001 Bradycardia, unspecified: Secondary | ICD-10-CM

## 2017-07-17 LAB — COMPREHENSIVE METABOLIC PANEL
ALBUMIN: 3.9 g/dL (ref 3.5–5.2)
ALK PHOS: 83 U/L (ref 39–117)
ALT: 12 U/L (ref 0–35)
AST: 14 U/L (ref 0–37)
BILIRUBIN TOTAL: 0.5 mg/dL (ref 0.2–1.2)
BUN: 11 mg/dL (ref 6–23)
CO2: 29 mEq/L (ref 19–32)
Calcium: 9.5 mg/dL (ref 8.4–10.5)
Chloride: 103 mEq/L (ref 96–112)
Creatinine, Ser: 0.69 mg/dL (ref 0.40–1.20)
GFR: 105.5 mL/min (ref >60.00)
Glucose, Bld: 267 mg/dL — ABNORMAL HIGH (ref 70–99)
POTASSIUM: 3.8 meq/L (ref 3.5–5.1)
SODIUM: 139 meq/L (ref 135–145)
TOTAL PROTEIN: 7.1 g/dL (ref 6.0–8.3)

## 2017-07-17 LAB — URINALYSIS, ROUTINE W REFLEX MICROSCOPIC
Bilirubin Urine: NEGATIVE
Hgb urine dipstick: NEGATIVE
Leukocytes, UA: NEGATIVE
Nitrite: NEGATIVE
Specific Gravity, Urine: 1.025 (ref 1.000–1.030)
Total Protein, Urine: NEGATIVE
UROBILINOGEN UA: 0.2 (ref 0.0–1.0)
Urine Glucose: 250 — AB
pH: 7 (ref 5.0–8.0)

## 2017-07-17 LAB — LIPID PANEL
Cholesterol: 160 mg/dL (ref 0–200)
HDL: 46.7 mg/dL (ref >39.00)
LDL Cholesterol: 92 mg/dL (ref 0–99)
NONHDL: 113.6
Total CHOL/HDL Ratio: 3
Triglycerides: 108 mg/dL (ref 0.0–149.0)
VLDL: 21.6 mg/dL (ref 0.0–40.0)

## 2017-07-17 LAB — CBC WITH DIFFERENTIAL/PLATELET
BASOS ABS: 0.1 10*3/uL (ref 0.0–0.1)
Basophils Relative: 1 % (ref 0.0–3.0)
EOS PCT: 1.6 % (ref 0.0–5.0)
Eosinophils Absolute: 0.1 10*3/uL (ref 0.0–0.7)
HCT: 36.1 % (ref 36.0–46.0)
HEMOGLOBIN: 11.5 g/dL — AB (ref 12.0–15.0)
Lymphocytes Relative: 33.6 % (ref 12.0–46.0)
Lymphs Abs: 1.8 10*3/uL (ref 0.7–4.0)
MCHC: 31.9 g/dL (ref 30.0–36.0)
MCV: 85.1 fl (ref 78.0–100.0)
MONO ABS: 0.4 10*3/uL (ref 0.1–1.0)
MONOS PCT: 7.3 % (ref 3.0–12.0)
Neutro Abs: 3.1 10*3/uL (ref 1.4–7.7)
Neutrophils Relative %: 56.5 % (ref 43.0–77.0)
Platelets: 266 10*3/uL (ref 150.0–400.0)
RBC: 4.25 Mil/uL (ref 3.87–5.11)
RDW: 13.1 % (ref 11.5–15.5)
WBC: 5.4 10*3/uL (ref 4.0–10.5)

## 2017-07-17 LAB — IBC PANEL
IRON: 34 ug/dL — AB (ref 42–145)
SATURATION RATIOS: 8.3 % — AB (ref 20.0–50.0)
Transferrin: 291 mg/dL (ref 212.0–360.0)

## 2017-07-17 LAB — HEMOGLOBIN A1C: HEMOGLOBIN A1C: 7.3 % — AB (ref 4.6–6.5)

## 2017-07-17 LAB — THYROID PANEL WITH TSH
FREE THYROXINE INDEX: 2.1 (ref 1.4–3.8)
T3 UPTAKE: 31 % (ref 22–35)
T4, Total: 6.9 ug/dL (ref 5.1–11.9)
TSH: 2.76 m[IU]/L (ref 0.40–4.50)

## 2017-07-17 LAB — VITAMIN D 25 HYDROXY (VIT D DEFICIENCY, FRACTURES): VITD: 29.36 ng/mL — ABNORMAL LOW (ref 30.00–100.00)

## 2017-07-17 LAB — FERRITIN: FERRITIN: 27 ng/mL (ref 10.0–291.0)

## 2017-07-17 MED ORDER — CHOLECALCIFEROL 50 MCG (2000 UT) PO TABS: 1.0000 | ORAL_TABLET | Freq: Every day | ORAL | 1 refills | Status: DC

## 2017-07-17 MED ORDER — FERROUS SULFATE 325 (65 FE) MG PO TABS: 325.0000 mg | ORAL_TABLET | Freq: Two times a day (BID) | ORAL | 1 refills | Status: DC

## 2017-07-17 NOTE — Patient Instructions (Signed)

## 2017-07-17 NOTE — Progress Notes (Signed)
Subjective:  Patient ID: Tonya [name], female    DOB: Apr 15, 1941  Age: 77 y.o. MRN: [id number]  CC: Hypertension; Diabetes; and Anemia   HPI Tonya [name] presents for f/up - She complains of chronic, unchanged edema in her left lower extremity s/p a CVA many years ago.  She also has an occasional episode of heartburn for which she takes Prilosec.  She denies any recent episodes of chest pain, shortness of breath, or weight changes.  Outpatient Medications Prior to Visit  Medication Sig Dispense Refill  . aspirin 81 MG tablet Take 81 mg by mouth daily.      . Calcium Carbonate-Vitamin D (CALCIUM 600 + D PO) Take by mouth daily.      . cholestyramine (QUESTRAN) 4 G packet DISSOLVE 1 PACKET INTO 2 TO 6 OUNCES OF WATER AND DRINK 2 TIMES A DAY. 60 packet 11  . cyanocobalamin 2000 MCG tablet Take 1 tablet (2,000 mcg total) by mouth daily. 90 tablet 3  . diltiazem (CARDIZEM CD) 180 MG 24 hr capsule Take 1 capsule (180 mg total) by mouth daily. 90 capsule 1  . hydrochlorothiazide (HYDRODIURIL) 25 MG tablet Take 1 tablet (25 mg total) by mouth daily. 90 tablet 1  . metoprolol tartrate (LOPRESSOR) 25 MG tablet Take 1 tablet (25 mg total) by mouth 2 (two) times daily. 60 tablet 0  . Cholecalciferol 2000 UNITS TABS Take 1 tablet (2,000 Units total) by mouth daily. 90 tablet 3  . meloxicam (MOBIC) 7.5 MG tablet Take 1 tablet (7.5 mg total) by mouth daily as needed for pain. 90 tablet 0  . telmisartan (MICARDIS) 20 MG tablet Take 1 tablet (20 mg total) by mouth daily. (Patient not taking: Reported on 07/17/2017) 30 tablet 5   No facility-administered medications prior to visit.     ROS Review of Systems  Constitutional: Negative.  Negative for appetite change, diaphoresis, fatigue and unexpected weight change.  HENT: Negative.  Negative for sinus pressure and trouble swallowing.   Eyes: Negative for visual disturbance.  Respiratory: Negative for cough, chest tightness, shortness of breath and  wheezing.   Cardiovascular: Positive for leg swelling. Negative for chest pain and palpitations.  Gastrointestinal: Negative.  Negative for abdominal pain, diarrhea, nausea and vomiting.  Endocrine: Negative for polydipsia, polyphagia and polyuria.  Genitourinary: Negative.  Negative for decreased urine volume, difficulty urinating, dysuria and urgency.  Musculoskeletal: Negative.  Negative for back pain, myalgias and neck pain.  Skin: Negative.   Neurological: Negative.  Negative for dizziness, weakness, light-headedness and headaches.  Hematological: Negative for adenopathy. Does not bruise/bleed easily.  Psychiatric/Behavioral: Negative.     Objective:  BP (!) 160/80 (BP Location: Right Arm, Patient Position: Sitting, Cuff Size: Normal)   Pulse 94   Temp 98.3 F (36.8 C) (Oral)   Resp 16   Ht 5\' 11"  (1.803 m)   Wt 190 lb (86.2 kg)   SpO2 94%   BMI 26.50 kg/m   BP Readings from Last 3 Encounters:  07/17/17 (!) 160/80  06/18/17 (!) 150/78  01/23/17 (!) 144/76    Wt Readings from Last 3 Encounters:  07/17/17 190 lb (86.2 kg)  06/18/17 191 lb (86.6 kg)  01/23/17 189 lb (85.7 kg)    Physical Exam  Constitutional: She is oriented to person, place, and time. No distress.  HENT:  Mouth/Throat: Oropharynx is clear and moist. No oropharyngeal exudate.  Eyes: Conjunctivae are normal. Left eye exhibits no discharge. No scleral icterus.  Neck: Normal range of  motion. Neck supple. No JVD present. No thyromegaly present.  Cardiovascular: Normal rate, regular rhythm and normal heart sounds. Exam reveals no gallop.  No murmur heard. Pulmonary/Chest: Effort normal and breath sounds normal. No respiratory distress. She has no wheezes. She has no rales.  Abdominal: Soft. Bowel sounds are normal. She exhibits no distension and no mass. There is no tenderness. There is no guarding.  Musculoskeletal: Normal range of motion. She exhibits edema (trace pitting edema in LLE). She exhibits no  tenderness.  Lymphadenopathy:    She has no cervical adenopathy.  Neurological: She is alert and oriented to person, place, and time.  Skin: Skin is warm and dry. No rash noted. She is not diaphoretic. No erythema. No pallor.  Vitals reviewed.   Lab Results  Component Value Date   WBC 5.4 07/17/2017   HGB 11.5 (L) 07/17/2017   HCT 36.1 07/17/2017   PLT 266.0 07/17/2017   GLUCOSE 267 (H) 07/17/2017   CHOL 160 07/17/2017   TRIG 108.0 07/17/2017   HDL 46.70 07/17/2017   LDLCALC 92 07/17/2017   ALT 12 07/17/2017   AST 14 07/17/2017   NA 139 07/17/2017   K 3.8 07/17/2017   CL 103 07/17/2017   CREATININE 0.69 07/17/2017   BUN 11 07/17/2017   CO2 29 07/17/2017   TSH 2.76 07/17/2017   HGBA1C 7.3 (H) 07/17/2017    Mm Screening Breast Tomo Bilateral  Result Date: 07/06/2016 CLINICAL DATA:  Screening. EXAM: 2D DIGITAL SCREENING BILATERAL MAMMOGRAM WITH CAD AND ADJUNCT TOMO COMPARISON:  Previous exam(s). ACR Breast Density Category b: There are scattered areas of fibroglandular density. FINDINGS: There are no findings suspicious for malignancy. Images were processed with CAD. IMPRESSION: No mammographic evidence of malignancy. A result letter of this screening mammogram will be mailed directly to the patient. RECOMMENDATION: Screening mammogram in one year. (Code:SM-B-01Y) BI-RADS CATEGORY  1: Negative. Electronically Signed   By: Abelardo Diesel M.D.   On: 07/06/2016 15:14    Assessment & Plan:   [name] was seen today for hypertension, diabetes and anemia.  Diagnoses and all orders for this visit:  Essential hypertension-blood pressure is not adequately well controlled.  Her lab work is negative for secondary causes or endorgan damage.  I will treat the vitamin D deficiency. -     Thyroid Panel With TSH; Future -     Urinalysis, Routine w reflex microscopic; Future -     VITAMIN D 25 Hydroxy (Vit-D Deficiency, Fractures); Future  Bradycardia- Her pulse is up to 94 today.  This has  resolved. -     Thyroid Panel With TSH; Future  Hyperglycemia- Her A1c is up to 7.3%.  She has new onset type 2 diabetes mellitus. -     Hemoglobin A1c; Future  Other iron deficiency anemia-she remains mildly anemic and has a low iron level.  I have asked her to start taking an iron supplement. -     CBC with Differential/Platelet; Future -     IBC panel; Future -     Ferritin; Future -     ferrous sulfate 325 (65 FE) MG tablet; Take 1 tablet (325 mg total) by mouth 2 (two) times daily with a meal.  Hyperlipidemia with target LDL less than 100- She previously took simvastatin, she is not sure why she stopped taking it.  She has not achieved her LDL goal and has an elevated ASCVD risk score.  I have asked her to start taking a statin for CV risk reduction. -  Comprehensive metabolic panel; Future -     Lipid panel; Future  Vitamin D deficiency -     Discontinue: Cholecalciferol 2000 units TABS; Take 1 tablet (2,000 Units total) by mouth daily. -     Cholecalciferol 2000 units TABS; Take 1 tablet (2,000 Units total) by mouth daily.  Type 2 diabetes mellitus with complication, without long-term current use of insulin ([place])- Her A1c is up to 7.3%.  She has new onset type 2 diabetes mellitus.  Medical therapy is not indicated.   I have discontinued France M. Friesenhahn's meloxicam and telmisartan. I am also having her start on ferrous sulfate and atorvastatin. Additionally, I am having her maintain her aspirin, Calcium Carbonate-Vitamin D (CALCIUM 600 + D PO), cholestyramine, cyanocobalamin, diltiazem, metoprolol tartrate, hydrochlorothiazide, and Cholecalciferol.  Meds ordered this encounter  Medications  . DISCONTD: Cholecalciferol 2000 units TABS    Sig: Take 1 tablet (2,000 Units total) by mouth daily.    Dispense:  90 tablet    Refill:  1  . ferrous sulfate 325 (65 FE) MG tablet    Sig: Take 1 tablet (325 mg total) by mouth 2 (two) times daily with a meal.    Dispense:  180 tablet     Refill:  1  . Cholecalciferol 2000 units TABS    Sig: Take 1 tablet (2,000 Units total) by mouth daily.    Dispense:  90 tablet    Refill:  1  . atorvastatin (LIPITOR) 10 MG tablet    Sig: Take 1 tablet (10 mg total) by mouth daily.    Dispense:  90 tablet    Refill:  1     Follow-up: Return in about 4 months (around 11/16/2017).  Scarlette Calico, MD

## 2017-07-18 ENCOUNTER — Encounter: Payer: Self-pay | Admitting: Internal Medicine

## 2017-07-19 DIAGNOSIS — E118 Type 2 diabetes mellitus with unspecified complications: Secondary | ICD-10-CM

## 2017-07-19 MED ORDER — ATORVASTATIN CALCIUM 10 MG PO TABS: 10.0000 mg | ORAL_TABLET | Freq: Every day | ORAL | 1 refills | Status: DC

## 2017-08-07 DIAGNOSIS — Z0100 Encounter for examination of eyes and vision without abnormal findings: Secondary | ICD-10-CM | POA: Diagnosis not present

## 2017-09-18 DIAGNOSIS — R269 Unspecified abnormalities of gait and mobility: Secondary | ICD-10-CM | POA: Diagnosis not present

## 2017-09-18 DIAGNOSIS — M1990 Unspecified osteoarthritis, unspecified site: Secondary | ICD-10-CM | POA: Diagnosis not present

## 2017-09-18 DIAGNOSIS — I69354 Hemiplegia and hemiparesis following cerebral infarction affecting left non-dominant side: Secondary | ICD-10-CM | POA: Diagnosis not present

## 2017-09-18 DIAGNOSIS — R32 Unspecified urinary incontinence: Secondary | ICD-10-CM | POA: Diagnosis not present

## 2017-09-18 DIAGNOSIS — E538 Deficiency of other specified B group vitamins: Secondary | ICD-10-CM | POA: Diagnosis not present

## 2017-09-18 DIAGNOSIS — I10 Essential (primary) hypertension: Secondary | ICD-10-CM | POA: Diagnosis not present

## 2017-09-18 DIAGNOSIS — D649 Anemia, unspecified: Secondary | ICD-10-CM | POA: Diagnosis not present

## 2017-09-18 DIAGNOSIS — E785 Hyperlipidemia, unspecified: Secondary | ICD-10-CM | POA: Diagnosis not present

## 2017-09-18 DIAGNOSIS — K08409 Partial loss of teeth, unspecified cause, unspecified class: Secondary | ICD-10-CM | POA: Diagnosis not present

## 2017-09-18 DIAGNOSIS — G8929 Other chronic pain: Secondary | ICD-10-CM | POA: Diagnosis not present

## 2017-09-27 ENCOUNTER — Other Ambulatory Visit: Payer: Self-pay | Admitting: Internal Medicine

## 2017-09-27 DIAGNOSIS — Z1231 Encounter for screening mammogram for malignant neoplasm of breast: Secondary | ICD-10-CM

## 2017-10-01 ENCOUNTER — Encounter (HOSPITAL_COMMUNITY): Payer: Self-pay

## 2017-10-01 ENCOUNTER — Ambulatory Visit (HOSPITAL_COMMUNITY)
Admission: RE | Admit: 2017-10-01 | Discharge: 2017-10-01 | Disposition: A | Discharge status: Home / Self Care | Payer: Medicare HMO | Source: Ambulatory Visit | Attending: Internal Medicine | Admitting: Internal Medicine

## 2017-10-01 DIAGNOSIS — Z1231 Encounter for screening mammogram for malignant neoplasm of breast: Secondary | ICD-10-CM | POA: Insufficient documentation

## 2017-10-01 LAB — HM MAMMOGRAPHY

## 2017-10-09 ENCOUNTER — Ambulatory Visit: Payer: Self-pay | Admitting: *Deleted

## 2017-10-09 NOTE — Telephone Encounter (Signed)
Patient's daughter is calling to report that her mother is having an increase in her left ankle swelling. She reports that her mother normally has swelling in that foot- but it is twice the normal size for a period of 2 weeks now. She is not reporting pain or additional swelling in any other area or in the other leg.  Daughter also wants to discuss a problem that her mother is having intermittently- she states she is having trouble with her tongue. It will occasionally stick to the top of her mouth frustrating her because it interferes with her speech. This problem comes and goes and has been occurring for 1 month now. She is addiment that this is not stroke related and feels that her mother may need a referral to ENT. Appointment scheduled for evaluation of ankle.   Answer Assessment - Initial Assessment Questions 1. LOCATION: "Which joint is swollen?"     Whole foot is swollen 2. ONSET: "When did the swelling start?"     2 weeks ago noticed changes 3. SIZE: "How large is the swelling?"     Double size normally is 4. PAIN: "Is there any pain?" If so, ask: "How bad is it?" (Scale 1-10; or mild, moderate, severe)     No pain 5. CAUSE: "What do you think caused the swollen joint?"     increase 6. OTHER SYMPTOMS: "Do you have any other symptoms?" (e.g., fever, chest pain, difficulty breathing, calf pain)     no 7. PREGNANCY: "Is there any chance you are pregnant?" "When was your last menstrual period?"     n/a  Answer Assessment - Initial Assessment Questions 1. SYMPTOM: "What is the main symptom you are concerned about?" (e.g., weakness, numbness)     Tongue movement is sticking to roof of mouth 2. ONSET: "When did this start?" (minutes, hours, days; while sleeping)     1 month - since the change 3. LAST NORMAL: "When was the last time you were normal (no symptoms)?"     1 month ago 4. PATTERN "Does this come and go, or has it been constant since it started?"  "Is it present now?"     Comes  and goes 5. CARDIAC SYMPTOMS: "Have you had any of the following symptoms: chest pain, difficulty breathing, palpitations?"     No  6. NEUROLOGIC SYMPTOMS: "Have you had any of the following symptoms: headache, dizziness, vision loss, double vision, changes in speech, unsteady on your feet?"     Comes and goes- is affect her speech and frustrates her 7. OTHER SYMPTOMS: "Do you have any other symptoms?"     no 8. PREGNANCY: "Is there any chance you are pregnant?" "When was your last menstrual period?"     n/a  Protocols used: ANKLE SWELLING-A-AH, NEUROLOGIC DEFICIT-A-AH

## 2017-10-09 NOTE — Telephone Encounter (Signed)
Pt coming in tomorrow

## 2017-10-10 ENCOUNTER — Ambulatory Visit (INDEPENDENT_AMBULATORY_CARE_PROVIDER_SITE_OTHER): Payer: Medicare HMO | Admitting: Internal Medicine

## 2017-10-10 ENCOUNTER — Encounter: Payer: Self-pay | Admitting: Internal Medicine

## 2017-10-10 ENCOUNTER — Other Ambulatory Visit (INDEPENDENT_AMBULATORY_CARE_PROVIDER_SITE_OTHER): Payer: Medicare HMO

## 2017-10-10 VITALS — BP 140/70 | HR 49 | Temp 98.4°F | Ht 71.0 in | Wt 181.2 lb

## 2017-10-10 DIAGNOSIS — R600 Localized edema: Secondary | ICD-10-CM | POA: Diagnosis not present

## 2017-10-10 DIAGNOSIS — I493 Ventricular premature depolarization: Secondary | ICD-10-CM

## 2017-10-10 DIAGNOSIS — D508 Other iron deficiency anemias: Secondary | ICD-10-CM

## 2017-10-10 DIAGNOSIS — E118 Type 2 diabetes mellitus with unspecified complications: Secondary | ICD-10-CM | POA: Diagnosis not present

## 2017-10-10 DIAGNOSIS — I10 Essential (primary) hypertension: Secondary | ICD-10-CM | POA: Diagnosis not present

## 2017-10-10 DIAGNOSIS — K130 Diseases of lips: Secondary | ICD-10-CM | POA: Diagnosis not present

## 2017-10-10 DIAGNOSIS — R001 Bradycardia, unspecified: Secondary | ICD-10-CM

## 2017-10-10 LAB — BASIC METABOLIC PANEL
BUN: 13 mg/dL (ref 6–23)
CHLORIDE: 100 meq/L (ref 96–112)
CO2: 31 meq/L (ref 19–32)
CREATININE: 0.76 mg/dL (ref 0.40–1.20)
Calcium: 9.6 mg/dL (ref 8.4–10.5)
GFR: 94.31 mL/min (ref >60.00)
Glucose, Bld: 385 mg/dL — ABNORMAL HIGH (ref 70–99)
Potassium: 4.1 mEq/L (ref 3.5–5.1)
Sodium: 137 mEq/L (ref 135–145)

## 2017-10-10 LAB — CBC WITH DIFFERENTIAL/PLATELET
Basophils Absolute: 0 10*3/uL (ref 0.0–0.1)
Basophils Relative: 0.7 % (ref 0.0–3.0)
EOS PCT: 1.8 % (ref 0.0–5.0)
Eosinophils Absolute: 0.1 10*3/uL (ref 0.0–0.7)
HCT: 37.8 % (ref 36.0–46.0)
Hemoglobin: 12.2 g/dL (ref 12.0–15.0)
LYMPHS ABS: 1.5 10*3/uL (ref 0.7–4.0)
Lymphocytes Relative: 28.9 % (ref 12.0–46.0)
MCHC: 32.2 g/dL (ref 30.0–36.0)
MCV: 84.6 fl (ref 78.0–100.0)
MONO ABS: 0.4 10*3/uL (ref 0.1–1.0)
Monocytes Relative: 6.9 % (ref 3.0–12.0)
NEUTROS ABS: 3.3 10*3/uL (ref 1.4–7.7)
NEUTROS PCT: 61.7 % (ref 43.0–77.0)
Platelets: 265 10*3/uL (ref 150.0–400.0)
RBC: 4.46 Mil/uL (ref 3.87–5.11)
RDW: 13.1 % (ref 11.5–15.5)
WBC: 5.3 10*3/uL (ref 4.0–10.5)

## 2017-10-10 LAB — URINALYSIS, ROUTINE W REFLEX MICROSCOPIC
Bilirubin Urine: NEGATIVE
Hgb urine dipstick: NEGATIVE
Ketones, ur: NEGATIVE
Leukocytes, UA: NEGATIVE
Nitrite: NEGATIVE
RBC / HPF: NONE SEEN (ref 0–?)
SPECIFIC GRAVITY, URINE: 1.01 (ref 1.000–1.030)
TOTAL PROTEIN, URINE-UPE24: NEGATIVE
Urobilinogen, UA: 1 (ref 0.0–1.0)
pH: 7 (ref 5.0–8.0)

## 2017-10-10 LAB — MICROALBUMIN / CREATININE URINE RATIO
Creatinine,U: 96.2 mg/dL
Microalb Creat Ratio: 0.7 mg/g (ref 0.0–30.0)

## 2017-10-10 LAB — HEMOGLOBIN A1C: Hgb A1c MFr Bld: 9.8 % — ABNORMAL HIGH (ref 4.6–6.5)

## 2017-10-10 NOTE — Patient Instructions (Signed)
Iron Deficiency Anemia, Adult Iron deficiency anemia is a condition in which the concentration of red blood cells or hemoglobin in the blood is below normal because of too little iron. Hemoglobin is a substance in red blood cells that carries oxygen to the body's tissues. When the concentration of red blood cells or hemoglobin is too low, not enough oxygen reaches these tissues. Iron deficiency anemia is usually long-lasting (chronic) and it develops over time. It may or may not cause symptoms. It is a common type of anemia. What are the causes? This condition may be caused by:  Not enough iron in the diet.  Blood loss caused by bleeding in the intestine.  Blood loss from a gastrointestinal condition like Crohn disease.  Frequent blood draws, such as from blood donation.  Abnormal absorption in the gut.  Heavy menstrual periods in women.  Cancers of the gastrointestinal system, such as colon cancer.  What are the signs or symptoms? Symptoms of this condition may include:  Fatigue.  Headache.  Pale skin, lips, and nail beds.  Poor appetite.  Weakness.  Shortness of breath.  Dizziness.  Cold hands and feet.  Fast or irregular heartbeat.  Irritability. This is more common in severe anemia.  Rapid breathing. This is more common in severe anemia.  Mild anemia may not cause any symptoms. How is this diagnosed? This condition is diagnosed based on:  Your medical history.  A physical exam.  Blood tests.  You may have additional tests to find the underlying cause of your anemia, such as:  Testing for blood in the stool (fecal occult blood test).  A procedure to see inside your colon and rectum (colonoscopy).  A procedure to see inside your esophagus and stomach (endoscopy).  A test in which cells are removed from bone marrow (bone marrow aspiration) or fluid is removed from the bone marrow to be examined (biopsy). This is rarely needed.  How is this  treated? This condition is treated by correcting the cause of your iron deficiency. Treatment may involve:  Adding iron-rich foods to your diet.  Taking iron supplements. If you are pregnant or breastfeeding, you may need to take extra iron because your normal diet usually does not provide the amount of iron that you need.  Increasing vitamin C intake. Vitamin C helps your body absorb iron. Your health care provider may recommend that you take iron supplements along with a glass of orange juice or a vitamin C supplement.  Medicines to make heavy menstrual flow lighter.  Surgery.  You may need repeat blood tests to determine whether treatment is working. Depending on the underlying cause, the anemia should be corrected within 2 months of starting treatment. If the treatment does not seem to be working, you may need more testing. Follow these instructions at home: Medicines  Take over-the-counter and prescription medicines only as told by your health care provider. This includes iron supplements and vitamins.  If you cannot tolerate taking iron supplements by mouth, talk with your health care provider about taking them through a vein (intravenously) or an injection into a muscle.  For the best iron absorption, you should take iron supplements when your stomach is empty. If you cannot tolerate them on an empty stomach, you may need to take them with food.  Do not drink milk or take antacids at the same time as your iron supplements. Milk and antacids may interfere with iron absorption.  Iron supplements can cause constipation. To prevent constipation, include fiber   in your diet as told by your health care provider. A stool softener may also be recommended. Eating and drinking  Talk with your health care provider before changing your diet. He or she may recommend that you eat foods that contain a lot of iron, such as: ? Liver. ? Low-fat (lean) beef. ? Breads and cereals that have iron  added to them (are fortified). ? Eggs. ? Dried fruit. ? Dark green, leafy vegetables.  To help your body use the iron from iron-rich foods, eat those foods at the same time as fresh fruits and vegetables that are high in vitamin C. Foods that are high in vitamin C include: ? Oranges. ? Peppers. ? Tomatoes. ? Mangoes.  Drinkenoughfluid to keep your urine clear or pale yellow. General instructions  Return to your normal activities as told by your health care provider. Ask your health care provider what activities are safe for you.  Practice good hygiene. Anemia can make you more prone to illness and infection.  Keep all follow-up visits as told by your health care provider. This is important. Contact a health care provider if:  You feel nauseous or you vomit.  You feel weak.  You have unexplained sweating.  You develop symptoms of constipation, such as: ? Having fewer than three bowel movements a week. ? Straining to have a bowel movement. ? Having stools that are hard, dry, or larger than normal. ? Feeling full or bloated. ? Pain in the lower abdomen. ? Not feeling relief after having a bowel movement. Get help right away if:  You faint. If this happens, do not drive yourself to the hospital. Call your local emergency services (911 in the [place]  You have chest pain.  You have shortness of breath that: ? Is severe. ? Gets worse with physical activity.  You have a rapid heartbeat.  You become light-headed when getting up from a sitting or lying down position. This information is not intended to replace advice given to you by your health care provider. Make sure you discuss any questions you have with your health care provider. Document Released: 04/07/2000 Document Revised: 12/29/2015 Document Reviewed: 12/29/2015 Elsevier Interactive Patient Education  2018 [employer]  

## 2017-10-10 NOTE — Progress Notes (Signed)
Subjective:  Patient ID: Tonya [name], female    DOB: Dec 16, 1940  Age: 77 y.o. MRN: [id number]  CC: Hypertension and Diabetes   HPI Tonya [name] presents for f/up - She complains of mild weight loss and worsening left lower extremity edema over the last few months.  She also complains of chronic fatigue and willing exhausted.  She denies any recent episodes of chest pain, palpitations, near syncope, dizziness, lightheadedness, or polys.  Outpatient Medications Prior to Visit  Medication Sig Dispense Refill  . aspirin 81 MG tablet Take 81 mg by mouth daily.      Marland Kitchen atorvastatin (LIPITOR) 10 MG tablet Take 1 tablet (10 mg total) by mouth daily. 90 tablet 1  . Calcium Carbonate-Vitamin D (CALCIUM 600 + D PO) Take by mouth daily.      . Cholecalciferol 2000 units TABS Take 1 tablet (2,000 Units total) by mouth daily. 90 tablet 1  . cholestyramine (QUESTRAN) 4 G packet DISSOLVE 1 PACKET INTO 2 TO 6 OUNCES OF WATER AND DRINK 2 TIMES A DAY. 60 packet 11  . cyanocobalamin 2000 MCG tablet Take 1 tablet (2,000 mcg total) by mouth daily. 90 tablet 3  . diltiazem (CARDIZEM CD) 180 MG 24 hr capsule Take 1 capsule (180 mg total) by mouth daily. 90 capsule 1  . ferrous sulfate 325 (65 FE) MG tablet Take 1 tablet (325 mg total) by mouth 2 (two) times daily with a meal. 180 tablet 1  . hydrochlorothiazide (HYDRODIURIL) 25 MG tablet Take 1 tablet (25 mg total) by mouth daily. 90 tablet 1  . metoprolol tartrate (LOPRESSOR) 25 MG tablet Take 1 tablet (25 mg total) by mouth 2 (two) times daily. 60 tablet 0   No facility-administered medications prior to visit.     ROS Review of Systems  Constitutional: Positive for fatigue and unexpected weight change. Negative for activity change, appetite change, chills and diaphoresis.  HENT: Negative.   Eyes: Negative for visual disturbance.  Respiratory: Negative for cough, chest tightness, shortness of breath and wheezing.   Cardiovascular: Positive for leg  swelling. Negative for chest pain and palpitations.  Gastrointestinal: Negative for abdominal pain, diarrhea, nausea and vomiting.  Endocrine: Positive for polyphagia. Negative for polydipsia and polyuria.  Genitourinary: Negative.  Negative for difficulty urinating.  Musculoskeletal: Negative.  Negative for arthralgias, back pain, myalgias and neck pain.  Skin: Negative.        She complains of a lesion over her right lower lip.  It has been present for many, many years but she feels like it is growing and she wants to have it removed.  Allergic/Immunologic: Negative.   Neurological: Negative.  Negative for dizziness, weakness and light-headedness.  Hematological: Negative for adenopathy. Does not bruise/bleed easily.  Psychiatric/Behavioral: Negative.     Objective:  BP 140/70 (BP Location: Left Arm, Patient Position: Sitting, Cuff Size: Normal)   Pulse (!) 49   Temp 98.4 F (36.9 C) (Oral)   Ht 5\' 11"  (1.803 m)   Wt 181 lb 4 oz (82.2 kg)   SpO2 97%   BMI 25.28 kg/m   BP Readings from Last 3 Encounters:  10/10/17 140/70  07/17/17 (!) 160/80  06/18/17 (!) 150/78    Wt Readings from Last 3 Encounters:  10/10/17 181 lb 4 oz (82.2 kg)  07/17/17 190 lb (86.2 kg)  06/18/17 191 lb (86.6 kg)    Physical Exam  Constitutional: She is oriented to person, place, and time. No distress.  HENT:  Mouth/Throat:  Oropharynx is clear and moist. No oropharyngeal exudate.  Eyes: Conjunctivae are normal. No scleral icterus.  Neck: Normal range of motion. Neck supple. No JVD present. No thyromegaly present.  Cardiovascular: Regular rhythm. Bradycardia present. Exam reveals no friction rub.  No murmur heard. EKG --  Marked sinus  Bradycardia  BORDERLINE RHYTHM- no change from the prior EKG  Pulmonary/Chest: Effort normal and breath sounds normal. No respiratory distress. She has no wheezes. She has no rales.  Abdominal: Soft. Bowel sounds are normal. She exhibits no mass. There is no  hepatosplenomegaly. There is no tenderness.  Musculoskeletal: She exhibits edema. She exhibits no tenderness or deformity.  There is no edema in the right lower extremity.  In the left lower extremity there is 2+ pitting edema around the ankle and the dorsum of the foot.  There is a compressive, supportive device around her left knee.  Neurological: She is alert and oriented to person, place, and time.  Skin: Skin is warm and dry. She is not diaphoretic. No erythema. No pallor.  On the right lower lip along the vermilion surface just to the right side of midline there is a 5 mm pedunculated soft lesion with normal overlying epidermis.  Vitals reviewed.   Lab Results  Component Value Date   WBC 5.3 10/10/2017   HGB 12.2 10/10/2017   HCT 37.8 10/10/2017   PLT 265.0 10/10/2017   GLUCOSE 385 (H) 10/10/2017   CHOL 160 07/17/2017   TRIG 108.0 07/17/2017   HDL 46.70 07/17/2017   LDLCALC 92 07/17/2017   ALT 12 07/17/2017   AST 14 07/17/2017   NA 137 10/10/2017   K 4.1 10/10/2017   CL 100 10/10/2017   CREATININE 0.76 10/10/2017   BUN 13 10/10/2017   CO2 31 10/10/2017   TSH 2.76 07/17/2017   HGBA1C 9.8 (H) 10/10/2017   MICROALBUR <0.7 10/10/2017    Mm Digital Screening Bilateral  Result Date: 10/01/2017 CLINICAL DATA:  Screening. EXAM: DIGITAL SCREENING BILATERAL MAMMOGRAM WITH CAD COMPARISON:  Previous exam(s). ACR Breast Density Category b: There are scattered areas of fibroglandular density. FINDINGS: There are no findings suspicious for malignancy. Images were processed with CAD. IMPRESSION: No mammographic evidence of malignancy. A result letter of this screening mammogram will be mailed directly to the patient. RECOMMENDATION: Screening mammogram in one year. (Code:SM-B-01Y) BI-RADS CATEGORY  1: Negative. Electronically Signed   By: Ammie Ferrier M.D.   On: 10/01/2017 15:45    Assessment & Plan:   [name] was seen today for hypertension and diabetes.  Diagnoses and all  orders for this visit:  Frequent PVCs- She does not complain of palpitations and there are no PVCs on her EKG today. -     Holter monitor - 48 hour; Future  Bradycardia- I have asked her to undergo a 48-hour Holter monitor to see if her symptoms are related to significant bradycardia or pauses. -     EKG 12-Lead -     Holter monitor - 48 hour; Future  Type 2 diabetes mellitus with complication, without long-term current use of insulin ([facility]CC)-her blood sugars 384 and her A1c is up to 9.8%.  She will return to start treatment for this. -     Basic metabolic panel; Future -     Hemoglobin A1c; Future -     Microalbumin / creatinine urine ratio; Future  Leg edema, left- This is likely multifactorial related to her prior CVA, the device she wears around her left knee, and the recent addition  of the CCB.  I have asked her to see vascular surgery to see if she would benefit from a compression device.  I have also ordered a d-dimer to see if there is concern for DVT. -     D-dimer, quantitative (not at [facility]); Future -     Ambulatory referral to Vascular Surgery  Other iron deficiency anemia- Improvement noted. -     CBC with Differential/Platelet; Future  Essential hypertension- Her blood pressure is adequately well controlled. -     Urinalysis, Routine w reflex microscopic; Future  Lesion of lip -     Ambulatory referral to Dermatology   I am having Delrae Alfred. Scalese maintain her aspirin, Calcium Carbonate-Vitamin D (CALCIUM 600 + D PO), cholestyramine, cyanocobalamin, diltiazem, metoprolol tartrate, hydrochlorothiazide, ferrous sulfate, Cholecalciferol, and atorvastatin.  No orders of the defined types were placed in this encounter.    Follow-up: Return in about 3 months (around 01/10/2018).  Scarlette Calico, MD

## 2017-10-11 ENCOUNTER — Ambulatory Visit (INDEPENDENT_AMBULATORY_CARE_PROVIDER_SITE_OTHER): Payer: Medicare HMO | Admitting: Internal Medicine

## 2017-10-11 ENCOUNTER — Telehealth: Payer: Self-pay | Admitting: Internal Medicine

## 2017-10-11 ENCOUNTER — Encounter: Payer: Self-pay | Admitting: Internal Medicine

## 2017-10-11 VITALS — BP 144/80 | HR 56 | Temp 98.4°F | Resp 16 | Ht 71.0 in | Wt 181.0 lb

## 2017-10-11 DIAGNOSIS — E118 Type 2 diabetes mellitus with unspecified complications: Secondary | ICD-10-CM

## 2017-10-11 DIAGNOSIS — R7989 Other specified abnormal findings of blood chemistry: Secondary | ICD-10-CM | POA: Diagnosis not present

## 2017-10-11 DIAGNOSIS — R600 Localized edema: Secondary | ICD-10-CM | POA: Diagnosis not present

## 2017-10-11 LAB — POCT GLYCOSYLATED HEMOGLOBIN (HGB A1C): Hemoglobin A1C: 10 % — AB (ref 4.0–5.6)

## 2017-10-11 LAB — D-DIMER, QUANTITATIVE (NOT AT ARMC): D DIMER QUANT: 1.09 ug{FEU}/mL — AB (ref <0.50)

## 2017-10-11 MED ORDER — DAPAGLIFLOZIN PRO-METFORMIN ER 2.5-1000 MG PO TB24: 2.0000 | ORAL_TABLET | Freq: Every day | ORAL | 1 refills | Status: DC

## 2017-10-11 MED ORDER — DAPAGLIFLOZIN PRO-METFORMIN ER 2.5-1000 MG PO TB24: 1.0000 | ORAL_TABLET | Freq: Every day | ORAL | 1 refills | Status: DC

## 2017-10-11 MED ORDER — INSULIN DEGLUDEC 200 UNIT/ML ~~LOC~~ SOPN: 30.0000 [IU] | PEN_INJECTOR | Freq: Every day | SUBCUTANEOUS | 1 refills | Status: DC

## 2017-10-11 MED ORDER — FREESTYLE LIBRE 14 DAY READER DEVI: 1.0000 | Freq: Every day | 11 refills | Status: DC

## 2017-10-11 MED ORDER — FREESTYLE LIBRE 14 DAY SENSOR MISC: 1.0000 | Freq: Every day | 11 refills | Status: DC

## 2017-10-11 NOTE — Patient Instructions (Signed)

## 2017-10-11 NOTE — Progress Notes (Signed)
Subjective:  Patient ID: Tonya [name], female    DOB: 06/02/1940  Age: 77 y.o. MRN: [id number]  CC: Diabetes   HPI AZIZA STUCKERT presents for f/up - She was seen 1 day prior to this visit and was found to have significant hyperglycemia.  She has complained of recent weight loss and left lower extremity edema.  Her d-dimer is slightly elevated.  She denies polys, chest pain, or shortness of breath.  Outpatient Medications Prior to Visit  Medication Sig Dispense Refill  . aspirin 81 MG tablet Take 81 mg by mouth daily.      Marland Kitchen atorvastatin (LIPITOR) 10 MG tablet Take 1 tablet (10 mg total) by mouth daily. 90 tablet 1  . Calcium Carbonate-Vitamin D (CALCIUM 600 + D PO) Take by mouth daily.      . Cholecalciferol 2000 units TABS Take 1 tablet (2,000 Units total) by mouth daily. 90 tablet 1  . cholestyramine (QUESTRAN) 4 G packet DISSOLVE 1 PACKET INTO 2 TO 6 OUNCES OF WATER AND DRINK 2 TIMES A DAY. 60 packet 11  . cyanocobalamin 2000 MCG tablet Take 1 tablet (2,000 mcg total) by mouth daily. 90 tablet 3  . diltiazem (CARDIZEM CD) 180 MG 24 hr capsule Take 1 capsule (180 mg total) by mouth daily. 90 capsule 1  . ferrous sulfate 325 (65 FE) MG tablet Take 1 tablet (325 mg total) by mouth 2 (two) times daily with a meal. 180 tablet 1  . hydrochlorothiazide (HYDRODIURIL) 25 MG tablet Take 1 tablet (25 mg total) by mouth daily. 90 tablet 1  . metoprolol tartrate (LOPRESSOR) 25 MG tablet Take 1 tablet (25 mg total) by mouth 2 (two) times daily. 60 tablet 0   No facility-administered medications prior to visit.     ROS Review of Systems  Constitutional: Positive for unexpected weight change. Negative for diaphoresis and fatigue.  HENT: Negative.   Eyes: Negative for visual disturbance.  Respiratory: Negative for cough, chest tightness, shortness of breath and wheezing.   Cardiovascular: Positive for leg swelling. Negative for chest pain and palpitations.  Endocrine: Negative for polydipsia,  polyphagia and polyuria.  Genitourinary: Negative.  Negative for difficulty urinating and urgency.  Musculoskeletal: Negative for arthralgias, joint swelling and myalgias.  Skin: Negative.  Negative for color change, pallor and rash.  Neurological: Negative.  Negative for dizziness, weakness and light-headedness.  Hematological: Negative for adenopathy. Does not bruise/bleed easily.    Objective:  BP (!) 144/80 (BP Location: Left Arm, Patient Position: Sitting, Cuff Size: Normal)   Pulse (!) 56   Temp 98.4 F (36.9 C) (Oral)   Resp 16   Ht 5\' 11"  (1.803 m)   Wt 181 lb (82.1 kg)   SpO2 95%   BMI 25.24 kg/m   BP Readings from Last 3 Encounters:  10/11/17 (!) 144/80  10/10/17 140/70  07/17/17 (!) 160/80    Wt Readings from Last 3 Encounters:  10/11/17 181 lb (82.1 kg)  10/10/17 181 lb 4 oz (82.2 kg)  07/17/17 190 lb (86.2 kg)    Physical Exam  Constitutional: She is oriented to person, place, and time. No distress.  HENT:  Mouth/Throat: Oropharynx is clear and moist. No oropharyngeal exudate.  Eyes: Conjunctivae are normal. No scleral icterus.  Neck: Normal range of motion. Neck supple. No JVD present. No thyromegaly present.  Cardiovascular: Regular rhythm and normal heart sounds. Bradycardia present.  No murmur heard. Pulmonary/Chest: Effort normal and breath sounds normal. She has no wheezes. She has  no rales.  Abdominal: Soft. Bowel sounds are normal. She exhibits no mass. There is no tenderness.  Musculoskeletal: She exhibits edema. She exhibits no deformity.  Lymphadenopathy:    She has no cervical adenopathy.  Neurological: She is alert and oriented to person, place, and time.  Skin: Skin is warm and dry. She is not diaphoretic.  Vitals reviewed.   Lab Results  Component Value Date   WBC 5.3 10/10/2017   HGB 12.2 10/10/2017   HCT 37.8 10/10/2017   PLT 265.0 10/10/2017   GLUCOSE 385 (H) 10/10/2017   CHOL 160 07/17/2017   TRIG 108.0 07/17/2017   HDL 46.70  07/17/2017   LDLCALC 92 07/17/2017   ALT 12 07/17/2017   AST 14 07/17/2017   NA 137 10/10/2017   K 4.1 10/10/2017   CL 100 10/10/2017   CREATININE 0.76 10/10/2017   BUN 13 10/10/2017   CO2 31 10/10/2017   TSH 2.76 07/17/2017   HGBA1C 10.0 (A) 10/11/2017   MICROALBUR <0.7 10/10/2017    Mm Digital Screening Bilateral  Result Date: 10/01/2017 CLINICAL DATA:  Screening. EXAM: DIGITAL SCREENING BILATERAL MAMMOGRAM WITH CAD COMPARISON:  Previous exam(s). ACR Breast Density Category b: There are scattered areas of fibroglandular density. FINDINGS: There are no findings suspicious for malignancy. Images were processed with CAD. IMPRESSION: No mammographic evidence of malignancy. A result letter of this screening mammogram will be mailed directly to the patient. RECOMMENDATION: Screening mammogram in one year. (Code:SM-B-01Y) BI-RADS CATEGORY  1: Negative. Electronically Signed   By: Ammie Ferrier M.D.   On: 10/01/2017 15:45    Assessment & Plan:   Kian was seen today for diabetes.  Diagnoses and all orders for this visit:  D-dimer, elevated- I have ordered an ultrasound of her left lower extremity to screen for deep venous thrombosis. -     VAS [place] LOWER EXTREMITY VENOUS (DVT); Future  Leg edema, left- see above -     VAS [place] LOWER EXTREMITY VENOUS (DVT); Future  Type 2 diabetes mellitus with complication, without long-term current use of insulin ([place])- Her A1c is up to 10.0%.  I have asked her to start using basal insulin as well as starting metformin and a SGLT2 inhibitor. -     POCT glycosylated hemoglobin (Hb A1C) -     Discontinue: Dapagliflozin-metFORMIN HCl ER (XIGDUO XR) 2.08-998 MG TB24; Take 1 tablet by mouth daily. -     Discontinue: Insulin Degludec (TRESIBA FLEXTOUCH) 200 UNIT/ML SOPN; Inject 30 Units into the skin daily. -     Discontinue: Continuous Blood Gluc Receiver (FREESTYLE LIBRE 14 DAY READER) DEVI; 1 Act by Does not apply route daily. -     Discontinue:  Continuous Blood Gluc Sensor (FREESTYLE LIBRE 14 DAY SENSOR) MISC; 1 Act by Does not apply route daily. -     Amb Referral to Nutrition and Diabetic E -     Discontinue: Dapagliflozin-metFORMIN HCl ER (XIGDUO XR) 2.08-998 MG TB24; Take 2 tablets by mouth daily.   I have discontinued Siriyah M. Alcoser's Dapagliflozin-metFORMIN HCl ER. I am also having her maintain her aspirin, Calcium Carbonate-Vitamin D (CALCIUM 600 + D PO), cholestyramine, cyanocobalamin, diltiazem, metoprolol tartrate, hydrochlorothiazide, ferrous sulfate, Cholecalciferol, and atorvastatin.  Meds ordered this encounter  Medications  . DISCONTD: Dapagliflozin-metFORMIN HCl ER (XIGDUO XR) 2.08-998 MG TB24    Sig: Take 1 tablet by mouth daily.    Dispense:  90 tablet    Refill:  1  . DISCONTD: Insulin Degludec (TRESIBA FLEXTOUCH) 200 UNIT/ML SOPN  Sig: Inject 30 Units into the skin daily.    Dispense:  9 mL    Refill:  1  . DISCONTD: Continuous Blood Gluc Receiver (FREESTYLE LIBRE 14 DAY READER) DEVI    Sig: 1 Act by Does not apply route daily.    Dispense:  1 Device    Refill:  11  . DISCONTD: Continuous Blood Gluc Sensor (FREESTYLE LIBRE 14 DAY SENSOR) MISC    Sig: 1 Act by Does not apply route daily.    Dispense:  1 each    Refill:  11  . DISCONTD: Dapagliflozin-metFORMIN HCl ER (XIGDUO XR) 2.08-998 MG TB24    Sig: Take 2 tablets by mouth daily.    Dispense:  180 tablet    Refill:  1     Follow-up: Return in about 3 months (around 01/11/2018).  Scarlette Calico, MD

## 2017-10-11 NOTE — Telephone Encounter (Signed)
Copied from [place] [phone]. Topic: Quick Communication - Rx Refill/Question >> Oct 11, 2017 12:44 PM Dawoud, Janett Billow L wrote: Medication: Continuous Blood Gluc Receiver (FREESTYLE LIBRE 14 DAY READER) DEVI, Continuous Blood Gluc Sensor (FREESTYLE LIBRE 14 DAY SENSOR) MISC, Dapagliflozin-metFORMIN HCl ER (XIGDUO XR) 2.08-998 MG TB24, Insulin Degludec (TRESIBA FLEXTOUCH) 200 UNIT/ML SOPN. Pt daughter stated that medications were set to wrong pharmacy.   Preferred Pharmacy (with phone number or street name): [place], [place] [phone] (Phone)   Agent: Please be advised that RX refills may take up to 3 business days. We ask that you follow-up with your pharmacy.

## 2017-10-12 ENCOUNTER — Encounter: Payer: Self-pay | Admitting: Internal Medicine

## 2017-10-12 ENCOUNTER — Ambulatory Visit (INDEPENDENT_AMBULATORY_CARE_PROVIDER_SITE_OTHER): Payer: Medicare HMO | Admitting: Internal Medicine

## 2017-10-12 ENCOUNTER — Ambulatory Visit (HOSPITAL_COMMUNITY)
Admission: RE | Admit: 2017-10-12 | Discharge: 2017-10-12 | Disposition: A | Discharge status: Home / Self Care | Payer: Medicare HMO | Source: Ambulatory Visit | Attending: Family | Admitting: Family

## 2017-10-12 ENCOUNTER — Other Ambulatory Visit: Payer: Self-pay | Admitting: *Deleted

## 2017-10-12 VITALS — BP 144/78 | HR 76 | Temp 97.5°F | Resp 16 | Wt 181.0 lb

## 2017-10-12 DIAGNOSIS — R600 Localized edema: Secondary | ICD-10-CM

## 2017-10-12 DIAGNOSIS — E118 Type 2 diabetes mellitus with unspecified complications: Secondary | ICD-10-CM

## 2017-10-12 DIAGNOSIS — Z8673 Personal history of transient ischemic attack (TIA), and cerebral infarction without residual deficits: Secondary | ICD-10-CM | POA: Insufficient documentation

## 2017-10-12 DIAGNOSIS — R7989 Other specified abnormal findings of blood chemistry: Secondary | ICD-10-CM | POA: Diagnosis not present

## 2017-10-12 DIAGNOSIS — R69 Illness, unspecified: Secondary | ICD-10-CM | POA: Diagnosis not present

## 2017-10-12 LAB — GLUCOSE, POCT (MANUAL RESULT ENTRY): POC Glucose: 144 mg/dl — AB (ref 70–99)

## 2017-10-12 MED ORDER — FREESTYLE LIBRE 14 DAY SENSOR MISC: 1.0000 | Freq: Every day | 11 refills | Status: DC

## 2017-10-12 MED ORDER — INSULIN DEGLUDEC 200 UNIT/ML ~~LOC~~ SOPN: 30.0000 [IU] | PEN_INJECTOR | Freq: Every day | SUBCUTANEOUS | 1 refills | Status: DC

## 2017-10-12 MED ORDER — BLOOD GLUCOSE MONITOR KIT: PACK | 0 refills | Status: DC

## 2017-10-12 MED ORDER — FREESTYLE LIBRE 14 DAY READER DEVI: 1.0000 | Freq: Every day | 11 refills | Status: DC

## 2017-10-12 MED ORDER — DAPAGLIFLOZIN PRO-METFORMIN ER 2.5-1000 MG PO TB24: 2.0000 | ORAL_TABLET | Freq: Every day | ORAL | 1 refills | Status: DC

## 2017-10-12 NOTE — Patient Instructions (Addendum)
Your ideal sugar in the morning should be 120-150  Your sugar should not be less than 100.     Call if you have any questions regarding your sugars.

## 2017-10-12 NOTE — Addendum Note (Signed)
Addended by: Karle Barr on: 10/12/2017 02:31 PM   Modules accepted: Orders

## 2017-10-12 NOTE — Telephone Encounter (Signed)
Rx forwarded to mail order pharmacy per patient request

## 2017-10-12 NOTE — Progress Notes (Signed)
Subjective:    Patient ID: Tonya [name], female    DOB: Sep 07, 1940, 77 y.o.   MRN: [id number]  HPI The patient is here for an acute visit.  She is here with her daughter and she has a family member who is an Therapist, sports on the phone.  They are here over confusion regarding the insulin she was prescribed yesterday.  Her A1c has increased over the past few months.  She was here 2 days ago and had blood work done and her A1c was 9.8.  She was asked to return to the office yesterday to be prescribed medication for her diabetes.  She was confused regarding the insulin and wanted clarification.  She did get her first dose of insulin yesterday and states that went well and she is felt fine over the past 24 hours.  Her family is going to help her with her diet-she has not been eating well.  Referral for nutritionist was placed and she is unsure if she will need that.  A freestyle libre monitor was sent to her mail away pharmacy.  I do not think she was aware of this and her family members were concerned that she was on insulin without being able to monitor her sugar.  They did reques a monitor that she can have over the weekend.  She was unsure what her sugar should be.  She was unsure if she would be on insulin for ever if this was something that she had to be on and why she was placed on it.  She was also placed on a pill for her sugars.   Blood sugar checked here today: 144    Medications and allergies reviewed with patient and updated if appropriate.  Patient Active Problem List   Diagnosis Date Noted  . D-dimer, elevated 10/11/2017  . Leg edema, left 10/10/2017  . Lesion of lip 10/10/2017  . Type 2 diabetes mellitus with complication, without long-term current use of insulin ([place]) 07/19/2017  . Vitamin D deficiency 07/17/2017  . Spinal stenosis of lumbar region 01/23/2017  . Frequent PVCs 11/23/2016  . Heart murmur 09/19/2016  . Iron deficiency anemia 05/17/2016  . Chronic right hip pain  06/08/2015  . Nonspecific abnormal electrocardiogram (ECG) (EKG) 02/23/2015  . Bradycardia 10/20/2014  . Routine general medical examination at a health care facility 01/02/2013  . Hypertonicity of bladder 12/20/2009  . Hyperlipidemia with target LDL less than 100 12/14/2008  . GERD 11/03/2008  . Right lumbar radiculitis 05/18/2008  . Essential hypertension 07/26/2007  . COPD 07/26/2007  . CVA, old, hemiparesis ([place]) 07/26/2007    Current Outpatient Medications on File Prior to Visit  Medication Sig Dispense Refill  . aspirin 81 MG tablet Take 81 mg by mouth daily.      Marland Kitchen atorvastatin (LIPITOR) 10 MG tablet Take 1 tablet (10 mg total) by mouth daily. 90 tablet 1  . Calcium Carbonate-Vitamin D (CALCIUM 600 + D PO) Take by mouth daily.      . Cholecalciferol 2000 units TABS Take 1 tablet (2,000 Units total) by mouth daily. 90 tablet 1  . cholestyramine (QUESTRAN) 4 G packet DISSOLVE 1 PACKET INTO 2 TO 6 OUNCES OF WATER AND DRINK 2 TIMES A DAY. 60 packet 11  . cyanocobalamin 2000 MCG tablet Take 1 tablet (2,000 mcg total) by mouth daily. 90 tablet 3  . diltiazem (CARDIZEM CD) 180 MG 24 hr capsule Take 1 capsule (180 mg total) by mouth daily. 90 capsule 1  .  ferrous sulfate 325 (65 FE) MG tablet Take 1 tablet (325 mg total) by mouth 2 (two) times daily with a meal. 180 tablet 1  . hydrochlorothiazide (HYDRODIURIL) 25 MG tablet Take 1 tablet (25 mg total) by mouth daily. 90 tablet 1  . metoprolol tartrate (LOPRESSOR) 25 MG tablet Take 1 tablet (25 mg total) by mouth 2 (two) times daily. 60 tablet 0   No current facility-administered medications on file prior to visit.     Past Medical History:  Diagnosis Date  . Cerebrovascular accident ([place])   . Chronic edema    ? venous insufficiency  . COPD (chronic obstructive pulmonary disease) ([place])    never had a problem breathing  . Enlarged heart    per pt  . Gallstones   . GERD (gastroesophageal reflux disease)   . Hyperlipidemia   .  Hypertension     Past Surgical History:  Procedure Laterality Date  . ABDOMINAL HYSTERECTOMY    . CHOLECYSTECTOMY      Social History   Socioeconomic History  . Marital status: Widowed    Spouse name: Not on file  . Number of children: Not on file  . Years of education: Not on file  . Highest education level: Not on file  Occupational History  . Not on file  Social Needs  . Financial resource strain: Not on file  . Food insecurity:    Worry: Not on file    Inability: Not on file  . Transportation needs:    Medical: Not on file    Non-medical: Not on file  Tobacco Use  . Smoking status: Never Smoker  . Smokeless tobacco: Never Used  Substance and Sexual Activity  . Alcohol use: No    Alcohol/week: 0.0 oz  . Drug use: No  . Sexual activity: Not Currently  Lifestyle  . Physical activity:    Days per week: Not on file    Minutes per session: Not on file  . Stress: Not on file  Relationships  . Social connections:    Talks on phone: Not on file    Gets together: Not on file    Attends religious service: Not on file    Active member of club or organization: Not on file    Attends meetings of clubs or organizations: Not on file    Relationship status: Not on file  Other Topics Concern  . Not on file  Social History Narrative  . Not on file    Family History  Problem Relation Age of Onset  . Cirrhosis Mother   . Heart disease Father   . Cancer Neg Hx        colon    Review of Systems  Eyes: Negative for visual disturbance.  Neurological: Negative for dizziness, light-headedness and headaches.  Psychiatric/Behavioral: Negative for confusion.       Objective:   Vitals:   10/12/17 1424  BP: (!) 144/78  Pulse: 76  Resp: 16  Temp: (!) 97.5 F (36.4 C)  SpO2: 98%   BP Readings from Last 3 Encounters:  10/12/17 (!) 144/78  10/11/17 (!) 144/80  10/10/17 140/70   Wt Readings from Last 3 Encounters:  10/12/17 181 lb (82.1 kg)  10/11/17 181 lb (82.1  kg)  10/10/17 181 lb 4 oz (82.2 kg)   Body mass index is 25.24 kg/m.   Physical Exam  Constitutional: She appears well-developed and well-nourished. No distress.  HENT:  Head: Normocephalic and atraumatic.  Skin: Skin is warm  and dry. She is not diaphoretic.           Assessment & Plan:   30 minutes were spent face-to-face with the patient, her daughter and a family member on the phone, over 50% of which was spent counseling them regarding her new diagnosis of diabetes.  Discussed treatment and why she was placed on insulin.  Discussed that there is a good chance that she will not need the insulin long-term and that once her sugars are better controlled diabetes should be able to discuss the importance of lifestyle changes be managed with pills alone.  Including decreasing sugar and carbohydrate intake, keeping her weight down in great increasing her exercise.  She is familiar with the insulin and think she can give this to her self.  Glucometer was prescribed so that she can start monitoring her sugars.  Family members will help with this.  Discussed range where sugars should ideally be for now.  Discussed that if sugars are less than 100 she should call if this will be too low for her.  Discussed that if she makes lifestyle changes her sugars may drop and we may need to decrease the amount of insulin she is on.  Advised that she needs to call if she has any questions regarding her sugar or her medication.   See Problem List for Assessment and Plan of chronic medical problems.

## 2017-10-13 NOTE — Assessment & Plan Note (Signed)
New diagnosis within the last few months Just started on medication-both oral and insulin Her family did have concerns about the medication and several questions regarding her diabetes and treatment Questions answered

## 2017-10-18 ENCOUNTER — Ambulatory Visit (INDEPENDENT_AMBULATORY_CARE_PROVIDER_SITE_OTHER): Payer: Medicare HMO | Admitting: Physician Assistant

## 2017-10-18 ENCOUNTER — Encounter: Payer: Self-pay | Admitting: Physician Assistant

## 2017-10-18 ENCOUNTER — Other Ambulatory Visit: Payer: Self-pay

## 2017-10-18 VITALS — BP 130/74 | HR 93 | Temp 97.9°F | Resp 18 | Ht 71.0 in | Wt 177.6 lb

## 2017-10-18 DIAGNOSIS — E119 Type 2 diabetes mellitus without complications: Secondary | ICD-10-CM | POA: Diagnosis not present

## 2017-10-18 NOTE — Progress Notes (Signed)
Tonya [name]  MRN: [id number] DOB: 1941-02-04  PCP: Janith Lima, MD  Chief Complaint  Patient presents with  . Diabetes    Subjective:  Pt presents to clinic for questions about her diabetes.  She has been recently diagnosed - started on insulin and oral medications at the same visit.  She has been checking her glucose 3-4 times a day which is hard for her because of her decreased ability to use her left hand after her stroke.  Her glucose readings at in the 170s fasting.  She is very nervous and worried  Breakfast - does not typically eat.  She eats lots of fruits and veggies and meats that she cooks at home.    History is obtained by patient.  Review of Systems  Constitutional: Negative for chills and fever.    Patient Active Problem List   Diagnosis Date Noted  . D-dimer, elevated 10/11/2017  . Leg edema, left 10/10/2017  . Lesion of lip 10/10/2017  . Type 2 diabetes mellitus with complication, without long-term current use of insulin ([place]) 07/19/2017  . Vitamin D deficiency 07/17/2017  . Spinal stenosis of lumbar region 01/23/2017  . Frequent PVCs 11/23/2016  . Heart murmur 09/19/2016  . Iron deficiency anemia 05/17/2016  . Chronic right hip pain 06/08/2015  . Nonspecific abnormal electrocardiogram (ECG) (EKG) 02/23/2015  . Bradycardia 10/20/2014  . Routine general medical examination at a health care facility 01/02/2013  . Hypertonicity of bladder 12/20/2009  . Hyperlipidemia with target LDL less than 100 12/14/2008  . GERD 11/03/2008  . Right lumbar radiculitis 05/18/2008  . Essential hypertension 07/26/2007  . COPD 07/26/2007  . CVA, old, hemiparesis ([place]) 07/26/2007    Current Outpatient Medications on File Prior to Visit  Medication Sig Dispense Refill  . aspirin 81 MG tablet Take 81 mg by mouth daily.      Marland Kitchen atorvastatin (LIPITOR) 10 MG tablet Take 1 tablet (10 mg total) by mouth daily. 90 tablet 1  . Calcium Carbonate-Vitamin D (CALCIUM 600 + D PO)  Take by mouth daily.      . Cholecalciferol 2000 units TABS Take 1 tablet (2,000 Units total) by mouth daily. 90 tablet 1  . cholestyramine (QUESTRAN) 4 G packet DISSOLVE 1 PACKET INTO 2 TO 6 OUNCES OF WATER AND DRINK 2 TIMES A DAY. 60 packet 11  . Continuous Blood Gluc Receiver (FREESTYLE LIBRE 14 DAY READER) DEVI 1 Act by Does not apply route daily. 1 Device 11  . Continuous Blood Gluc Sensor (FREESTYLE LIBRE 14 DAY SENSOR) MISC 1 Act by Does not apply route daily. 1 each 11  . cyanocobalamin 2000 MCG tablet Take 1 tablet (2,000 mcg total) by mouth daily. 90 tablet 3  . Dapagliflozin-metFORMIN HCl ER (XIGDUO XR) 2.08-998 MG TB24 Take 2 tablets by mouth daily. 180 tablet 1  . diltiazem (CARDIZEM CD) 180 MG 24 hr capsule Take 1 capsule (180 mg total) by mouth daily. 90 capsule 1  . ferrous sulfate 325 (65 FE) MG tablet Take 1 tablet (325 mg total) by mouth 2 (two) times daily with a meal. 180 tablet 1  . hydrochlorothiazide (HYDRODIURIL) 25 MG tablet Take 1 tablet (25 mg total) by mouth daily. 90 tablet 1  . Insulin Degludec (TRESIBA FLEXTOUCH) 200 UNIT/ML SOPN Inject 30 Units into the skin daily. 9 mL 1  . metoprolol tartrate (LOPRESSOR) 25 MG tablet Take 1 tablet (25 mg total) by mouth 2 (two) times daily. 60 tablet 0  . ONETOUCH  VERIO test strip USE TO TEST BLOOD SUGAR UP TO QID  0   No current facility-administered medications on file prior to visit.     Allergies  Allergen Reactions  . Hydrocodone   . Iohexol      Code: HIVES, Desc: PT developed hive and fullness in throat post injection of 125cc's Omni 300, Onset Date: 37858850   . Naproxen   . Propoxyphene N-Acetaminophen     Past Medical History:  Diagnosis Date  . Cerebrovascular accident ([place])   . Chronic edema    ? venous insufficiency  . COPD (chronic obstructive pulmonary disease) ([place])    never had a problem breathing  . Enlarged heart    per pt  . Gallstones   . GERD (gastroesophageal reflux disease)   .  Hyperlipidemia   . Hypertension    Social History   Social History Narrative  . Not on file   Social History   Tobacco Use  . Smoking status: Never Smoker  . Smokeless tobacco: Never Used  Substance Use Topics  . Alcohol use: No    Alcohol/week: 0.0 oz  . Drug use: No   family history includes Cirrhosis in her mother; Heart disease in her father.     Objective:  BP 130/74   Pulse 93   Temp 97.9 F (36.6 C) (Oral)   Resp 18   Ht 5\' 11"  (1.803 m)   Wt 177 lb 9.6 oz (80.6 kg)   SpO2 98%   BMI 24.77 kg/m  Body mass index is 24.77 kg/m.  Wt Readings from Last 3 Encounters:  10/18/17 177 lb 9.6 oz (80.6 kg)  10/12/17 181 lb (82.1 kg)  10/11/17 181 lb (82.1 kg)    Physical Exam  Constitutional: She is oriented to person, place, and time. She appears well-developed and well-nourished.  HENT:  Head: Normocephalic and atraumatic.  Right Ear: Hearing and external ear normal.  Left Ear: Hearing and external ear normal.  Eyes: Conjunctivae are normal.  Neck: Normal range of motion.  Pulmonary/Chest: Effort normal.  Neurological: She is alert and oriented to person, place, and time.  Skin: Skin is warm, dry and intact.  Psychiatric: She has a normal mood and affect. Her behavior is normal. Judgment and thought content normal.  Vitals reviewed.  Spent  35 mins with the patient - greater than 50% of the visit was face to face counseling patient regarding .  Assessment and Plan :  Type 2 diabetes mellitus without complication, without long-term current use of insulin ([place]) - for now we will stop insulin and just do farxiga and metformin along with her diet changes.    Spent 35 minutes with patient and her daughter discussing DM.  What the diagnosis means, options for patients and what she can do at home.  She will start with 2-3 meals a day consisting of fruits and veggies.  She will only drink water which is what she does now.  Answered her questions.  She will RTC in a  month to look at her food and glucose log.  She will only take her glucose 3x/day at differing times which were discussed with patient.    Glucose am - goal is 120-140, after meals 160-180  Patient verbalized to me that they understand the following: diagnosis, what is being done for them, what to expect and what should be done at home.  Their questions have been answered.  See after visit summary for patient specific instructions.  Windell Hummingbird  PA-C  [facility] 10/18/2017 1:21 PM  Please note: Portions of this report may have been transcribed using dragon voice recognition software. Every effort was made to ensure accuracy; however, inadvertent computerized transcription errors may be present.

## 2017-10-18 NOTE — Patient Instructions (Addendum)
Check glucose no more than 1 time per day !!!  In the am   2 hours after lunch  Keep a log of your foods   Breakfast - plain - with a fruit Lunch - meat and veggies Snack - peanut butter and half fruit Dinner - meat and veggies  The pill - take one a day for a month and then in a month let me see you back and we will increase the dose likely  IF you received an x-ray today, you will receive an invoice from [facility]. Please contact [facility] at [phone] with questions or concerns regarding your invoice.   IF you received labwork today, you will receive an invoice from [facility]. Please contact [facility] at [phone] with questions or concerns regarding your invoice.   Our billing staff will not be able to assist you with questions regarding bills from these companies.  You will be contacted with the lab results as soon as they are available. The fastest way to get your results is to activate your My Chart account. Instructions are located on the last page of this paperwork. If you have not heard from [place] regarding the results in 2 weeks, please contact this office.

## 2017-10-24 ENCOUNTER — Other Ambulatory Visit: Payer: Self-pay | Admitting: Internal Medicine

## 2017-10-24 ENCOUNTER — Telehealth: Payer: Self-pay | Admitting: *Deleted

## 2017-10-24 ENCOUNTER — Other Ambulatory Visit: Payer: Self-pay | Admitting: *Deleted

## 2017-10-24 DIAGNOSIS — R69 Illness, unspecified: Secondary | ICD-10-CM | POA: Diagnosis not present

## 2017-10-24 MED ORDER — ONETOUCH VERIO VI STRP: ORAL_STRIP | 0 refills | Status: DC

## 2017-10-24 NOTE — Telephone Encounter (Signed)
Per Pharmacy insurance is requiring PA on test strips , please call insurance at [phone]

## 2017-10-26 ENCOUNTER — Telehealth: Payer: Self-pay | Admitting: *Deleted

## 2017-11-08 ENCOUNTER — Other Ambulatory Visit: Payer: Self-pay | Admitting: Internal Medicine

## 2017-11-08 DIAGNOSIS — I10 Essential (primary) hypertension: Secondary | ICD-10-CM

## 2017-11-08 DIAGNOSIS — I493 Ventricular premature depolarization: Secondary | ICD-10-CM

## 2017-11-09 ENCOUNTER — Other Ambulatory Visit: Payer: Self-pay | Admitting: *Deleted

## 2017-11-09 DIAGNOSIS — R69 Illness, unspecified: Secondary | ICD-10-CM | POA: Diagnosis not present

## 2017-11-12 ENCOUNTER — Other Ambulatory Visit: Payer: Self-pay

## 2017-11-12 ENCOUNTER — Telehealth: Payer: Self-pay | Admitting: Physician Assistant

## 2017-11-12 NOTE — Telephone Encounter (Signed)
Copied from [place] [id number]. Topic: General - Other >> Nov 09, 2017  4:16 PM Yvette Rack wrote: Reason for CRM: Brooke with [employer] Rx for [employer] test strip needs to written for 3 times daily in order for the insurance company to pay otherwise the doctor would need to complete the paperwork. Cb# [phone]

## 2017-11-15 MED ORDER — ONETOUCH VERIO VI STRP: 1.0000 | ORAL_STRIP | Freq: Three times a day (TID) | 0 refills | Status: DC

## 2017-11-15 NOTE — Telephone Encounter (Signed)
I would prefer the patient the be able to use the freesyle libre for continuous blood glucose monitoring

## 2017-11-15 NOTE — Telephone Encounter (Signed)
Please see note below. 

## 2017-11-17 ENCOUNTER — Other Ambulatory Visit: Payer: Self-pay

## 2017-11-17 ENCOUNTER — Encounter: Payer: Self-pay | Admitting: Physician Assistant

## 2017-11-17 ENCOUNTER — Ambulatory Visit (INDEPENDENT_AMBULATORY_CARE_PROVIDER_SITE_OTHER): Payer: Medicare HMO | Admitting: Physician Assistant

## 2017-11-17 VITALS — BP 150/76 | HR 61 | Temp 98.7°F | Resp 18 | Ht 71.0 in | Wt 179.0 lb

## 2017-11-17 DIAGNOSIS — F430 Acute stress reaction: Secondary | ICD-10-CM | POA: Diagnosis not present

## 2017-11-17 DIAGNOSIS — E119 Type 2 diabetes mellitus without complications: Secondary | ICD-10-CM | POA: Diagnosis not present

## 2017-11-17 DIAGNOSIS — R69 Illness, unspecified: Secondary | ICD-10-CM | POA: Diagnosis not present

## 2017-11-17 NOTE — Progress Notes (Signed)
Tonya [name]  MRN: [id number] DOB: 1941-04-17  PCP: Mancel Bale, PA-C  Chief Complaint  Patient presents with  . Diabetes    follow up     Subjective:  Pt presents to clinic for further discussion of her diabetes.  She continues the check her sugar at least once a day and more likely 2-3 times a day.  She has been doing better with checking it 2 hours after a meal.  She is scared to eat anything because she does not want her glucose high.  Average at home is 170s.  She feels good but is very stressed with this diabetes and scared about eating anything.  Does not understand still what she could be eating.  History is obtained by patient.  Review of Systems  Psychiatric/Behavioral: The patient is nervous/anxious.     Patient Active Problem List   Diagnosis Date Noted  . D-dimer, elevated 10/11/2017  . Leg edema, left 10/10/2017  . Lesion of lip 10/10/2017  . Type 2 diabetes mellitus with complication, without long-term current use of insulin ([place]) 07/19/2017  . Vitamin D deficiency 07/17/2017  . Spinal stenosis of lumbar region 01/23/2017  . Frequent PVCs 11/23/2016  . Heart murmur 09/19/2016  . Iron deficiency anemia 05/17/2016  . Chronic right hip pain 06/08/2015  . Nonspecific abnormal electrocardiogram (ECG) (EKG) 02/23/2015  . Bradycardia 10/20/2014  . Routine general medical examination at a health care facility 01/02/2013  . Hypertonicity of bladder 12/20/2009  . Hyperlipidemia with target LDL less than 100 12/14/2008  . GERD 11/03/2008  . Right lumbar radiculitis 05/18/2008  . Essential hypertension 07/26/2007  . COPD 07/26/2007  . CVA, old, hemiparesis ([place]) 07/26/2007    Current Outpatient Medications on File Prior to Visit  Medication Sig Dispense Refill  . aspirin 81 MG tablet Take 81 mg by mouth daily.      Marland Kitchen atorvastatin (LIPITOR) 10 MG tablet Take 1 tablet (10 mg total) by mouth daily. 90 tablet 1  . Calcium Carbonate-Vitamin D (CALCIUM 600 + D PO)  Take by mouth daily.      . Cholecalciferol 2000 units TABS Take 1 tablet (2,000 Units total) by mouth daily. 90 tablet 1  . cholestyramine (QUESTRAN) 4 G packet DISSOLVE 1 PACKET INTO 2 TO 6 OUNCES OF WATER AND DRINK 2 TIMES A DAY. 60 packet 11  . cyanocobalamin 2000 MCG tablet Take 1 tablet (2,000 mcg total) by mouth daily. 90 tablet 3  . Dapagliflozin-metFORMIN HCl ER (XIGDUO XR) 2.08-998 MG TB24 Take 2 tablets by mouth daily. 180 tablet 1  . diltiazem (CARDIZEM CD) 180 MG 24 hr capsule TAKE 1 CAPSULE DAILY 90 capsule 1  . ferrous sulfate 325 (65 FE) MG tablet Take 1 tablet (325 mg total) by mouth 2 (two) times daily with a meal. 180 tablet 1  . hydrochlorothiazide (HYDRODIURIL) 25 MG tablet Take 1 tablet (25 mg total) by mouth daily. 90 tablet 1  . metoprolol tartrate (LOPRESSOR) 25 MG tablet Take 1 tablet (25 mg total) by mouth 2 (two) times daily. 60 tablet 0  . metoprolol tartrate (LOPRESSOR) 25 MG tablet TAKE 1 TABLET TWICE A DAY 180 tablet 1  . ONETOUCH VERIO test strip USE TO TEST BLOOD SUGAR UP TO FOUR TIMES DAILY 100 each 5  . ONETOUCH VERIO test strip 1 each by Other route 3 (three) times daily. USE TO TEST BLOOD SUGAR UP TO QID 100 each 0   No current facility-administered medications on file prior to  visit.     Allergies  Allergen Reactions  . Hydrocodone   . Iohexol      Code: HIVES, Desc: PT developed hive and fullness in throat post injection of 125cc's Omni 300, Onset Date: 62130865   . Naproxen   . Propoxyphene N-Acetaminophen     Past Medical History:  Diagnosis Date  . Cerebrovascular accident ([place])   . Chronic edema    ? venous insufficiency  . COPD (chronic obstructive pulmonary disease) ([place])    never had a problem breathing  . Enlarged heart    per pt  . Gallstones   . GERD (gastroesophageal reflux disease)   . Hyperlipidemia   . Hypertension    Social History   Social History Narrative  . Not on file   Social History   Tobacco Use  . Smoking  status: Never Smoker  . Smokeless tobacco: Never Used  Substance Use Topics  . Alcohol use: No    Alcohol/week: 0.0 oz  . Drug use: No   family history includes Cirrhosis in her mother; Heart disease in her father.     Objective:  BP (!) 150/76   Pulse 61   Temp 98.7 F (37.1 C) (Oral)   Resp 18   Ht 5\' 11"  (1.803 m)   Wt 179 lb (81.2 kg)   SpO2 99%   BMI 24.97 kg/m  Body mass index is 24.97 kg/m.  Wt Readings from Last 3 Encounters:  11/17/17 179 lb (81.2 kg)  10/18/17 177 lb 9.6 oz (80.6 kg)  10/12/17 181 lb (82.1 kg)    Physical Exam  Constitutional: She is oriented to person, place, and time. She appears well-developed and well-nourished.  HENT:  Head: Normocephalic and atraumatic.  Right Ear: Hearing and external ear normal.  Left Ear: Hearing and external ear normal.  Eyes: Conjunctivae are normal.  Neck: Normal range of motion.  Pulmonary/Chest: Effort normal.  Neurological: She is alert and oriented to person, place, and time.  Skin: Skin is warm, dry and intact.  Psychiatric: She has a normal mood and affect. Her behavior is normal. Judgment and thought content normal.  Vitals reviewed.   Assessment and Plan :  Type 2 diabetes mellitus without complication, without long-term current use of insulin ([facility])  Stress reaction causing mixed disturbance of emotion and conduct   Spent 30 mins with patient discussion what her glucose reading mean and the fact that she is going to be treated based on her age.  Discussion on what she can eat and what she should limit was done again with the patient and the daughter.  Encouraged pt to only check glucose several times a week because she is anxious about her readings and then if they are a little high she is checking multiple times.  D/w pt and daughter what numbers are concerning to [place] and what to do if that occurs.  She is taking her medication once daily.  She will see in 4 weeks to check her A1C.  Patient verbalized  to me that they understand the following: diagnosis, what is being done for them, what to expect and what should be done at home.  Their questions have been answered.  See after visit summary for patient specific instructions.  Windell Hummingbird PA-C  [facility] 11/17/2017 2:42 PM  Please note: Portions of this report may have been transcribed using dragon voice recognition software. Every effort was made to ensure accuracy; however, inadvertent computerized  transcription errors may be present.

## 2017-11-17 NOTE — Patient Instructions (Addendum)
Rules and Regulations by Judson Roch Monday - am before breakfast Wednesday- 2 hours after lunch Friday before bedtime  You may check the following day if you get a reading of over 200.  [address], [place], [place] [address] Phone: [phone]  In order for your supplemental calcium to be effective.  Please take calcium either on an empty stomach or with food that does not contain calcium.  Your body is able only to absorb 500mg  of Calcium at a time from any one source.  Do not take with a MVI with iron because iron does not allow th calcium to be absorbed.  Please take Calcium citrate 500mg  2-3x/day.  This supplement should have Vit D in it and if your pills do not please add addition Vit D 400 IU with each dose.  calcium content of some foods  calcium-fortified orange juice - 330-350 mg  calcium-fortified breakfast cereals (1 serving) - 200-400 mg  cheese (1 ounce) - 156-314 mg, depending on variety  cottage cheese, 1% milkfat (1 cup) - 138 mg  milk (1 cup) - nonfat 306 mg, low-fat 290 mg, whole 276 mg, soy milk 93 mg (368 mg if calcium-fortified)      IF you received an x-ray today, you will receive an invoice from [facility]. Please contact [facility] at [phone] with questions or concerns regarding your invoice.   IF you received labwork today, you will receive an invoice from [place]. Please contact [facility] at [phone] with questions or concerns regarding your invoice.   Our billing staff will not be able to assist you with questions regarding bills from these companies.  You will be contacted with the lab results as soon as they are available. The fastest way to get your results is to activate your My Chart account. Instructions are located on the last page of this paperwork. If you have not heard from [place] regarding the results in 2 weeks, please contact this office.

## 2017-11-29 ENCOUNTER — Other Ambulatory Visit: Payer: Self-pay

## 2017-11-29 ENCOUNTER — Encounter: Payer: Self-pay | Admitting: Physician Assistant

## 2017-11-29 ENCOUNTER — Ambulatory Visit (INDEPENDENT_AMBULATORY_CARE_PROVIDER_SITE_OTHER): Payer: Medicare HMO | Admitting: Physician Assistant

## 2017-11-29 VITALS — BP 138/76 | HR 68 | Temp 99.2°F | Resp 16 | Ht 71.0 in | Wt 174.2 lb

## 2017-11-29 DIAGNOSIS — J069 Acute upper respiratory infection, unspecified: Secondary | ICD-10-CM

## 2017-11-29 MED ORDER — GUAIFENESIN ER 600 MG PO TB12: 600.0000 mg | ORAL_TABLET | Freq: Two times a day (BID) | ORAL | 0 refills | Status: DC

## 2017-11-29 NOTE — Patient Instructions (Addendum)
Drink a good amount of fluids  Remember any liquid over the counter has sugar in it -- if you do do NOT check your glucose    IF you received an x-ray today, you will receive an invoice from [facility]. Please contact [facility] at [phone] with questions or concerns regarding your invoice.   IF you received labwork today, you will receive an invoice from [place]. Please contact [facility] at [phone] with questions or concerns regarding your invoice.   Our billing staff will not be able to assist you with questions regarding bills from these companies.  You will be contacted with the lab results as soon as they are available. The fastest way to get your results is to activate your My Chart account. Instructions are located on the last page of this paperwork. If you have not heard from [place] regarding the results in 2 weeks, please contact this office.

## 2017-11-29 NOTE — Progress Notes (Signed)
Tonya [name]  MRN: [id number] DOB: Feb 08, 1941  PCP: Mancel Bale, PA-C  Chief Complaint  Patient presents with  . URI    coughing, onset: 11/23/17, coughing more at night and early morning.  Tried all the otc's with no relief.  No fevers noticed.  Coughing up ivory colored mucus.      Subjective:  Pt presents to clinic for cold symptoms that started with dry deep cough.  No sinus congestion with she has light yellow rhinorrhea - she is clearing her throat a lot. The cough is coming from her throat.  Yesterday a slight sore throat after she had coughed so much.  She has used some cough drops, mucinex and listerine gargles.   Exposure to son who was sick with similar symptoms last week.  History is obtained by patient.  Review of Systems  Constitutional: Negative for chills and fever.  HENT: Positive for congestion, postnasal drip, rhinorrhea and sore throat.   Respiratory: Positive for cough.   Gastrointestinal: Negative for diarrhea, nausea and vomiting.  Musculoskeletal: Negative for myalgias.  Neurological: Negative for headaches.    Patient Active Problem List   Diagnosis Date Noted  . D-dimer, elevated 10/11/2017  . Leg edema, left 10/10/2017  . Lesion of lip 10/10/2017  . Type 2 diabetes mellitus with complication, without long-term current use of insulin ([place]) 07/19/2017  . Vitamin D deficiency 07/17/2017  . Spinal stenosis of lumbar region 01/23/2017  . Frequent PVCs 11/23/2016  . Heart murmur 09/19/2016  . Iron deficiency anemia 05/17/2016  . Chronic right hip pain 06/08/2015  . Nonspecific abnormal electrocardiogram (ECG) (EKG) 02/23/2015  . Bradycardia 10/20/2014  . Routine general medical examination at a health care facility 01/02/2013  . Hypertonicity of bladder 12/20/2009  . Hyperlipidemia with target LDL less than 100 12/14/2008  . GERD 11/03/2008  . Right lumbar radiculitis 05/18/2008  . Essential hypertension 07/26/2007  . COPD 07/26/2007  . CVA,  old, hemiparesis ([place]) 07/26/2007    Current Outpatient Medications on File Prior to Visit  Medication Sig Dispense Refill  . aspirin 81 MG tablet Take 81 mg by mouth daily.      [place] Kitchen atorvastatin (LIPITOR) 10 MG tablet Take 1 tablet (10 mg total) by mouth daily. 90 tablet 1  . Calcium Carbonate-Vitamin D (CALCIUM 600 + D PO) Take by mouth daily.      . Cholecalciferol 2000 units TABS Take 1 tablet (2,000 Units total) by mouth daily. 90 tablet 1  . cholestyramine (QUESTRAN) 4 G packet DISSOLVE 1 PACKET INTO 2 TO 6 OUNCES OF WATER AND DRINK 2 TIMES A DAY. 60 packet 11  . cyanocobalamin 2000 MCG tablet Take 1 tablet (2,000 mcg total) by mouth daily. 90 tablet 3  . Dapagliflozin-metFORMIN HCl ER (XIGDUO XR) 2.08-998 MG TB24 Take 2 tablets by mouth daily. 180 tablet 1  . diltiazem (CARDIZEM CD) 180 MG 24 hr capsule TAKE 1 CAPSULE DAILY 90 capsule 1  . ferrous sulfate 325 (65 FE) MG tablet Take 1 tablet (325 mg total) by mouth 2 (two) times daily with a meal. 180 tablet 1  . hydrochlorothiazide (HYDRODIURIL) 25 MG tablet Take 1 tablet (25 mg total) by mouth daily. 90 tablet 1  . metoprolol tartrate (LOPRESSOR) 25 MG tablet Take 1 tablet (25 mg total) by mouth 2 (two) times daily. 60 tablet 0  . metoprolol tartrate (LOPRESSOR) 25 MG tablet TAKE 1 TABLET TWICE A DAY 180 tablet 1  . ONETOUCH VERIO test strip USE TO  TEST BLOOD SUGAR UP TO FOUR TIMES DAILY 100 each 5  . ONETOUCH VERIO test strip 1 each by Other route 3 (three) times daily. USE TO TEST BLOOD SUGAR UP TO QID 100 each 0   No current facility-administered medications on file prior to visit.     Allergies  Allergen Reactions  . Hydrocodone   . Iohexol      Code: HIVES, Desc: PT developed hive and fullness in throat post injection of 125cc's Omni 300, Onset Date: [id number]   . Naproxen   . Propoxyphene N-Acetaminophen     Past Medical History:  Diagnosis Date  . Cerebrovascular accident ([place])   . Chronic edema    ? venous  insufficiency  . COPD (chronic obstructive pulmonary disease) ([place])    never had a problem breathing  . Enlarged heart    per pt  . Gallstones   . GERD (gastroesophageal reflux disease)   . Hyperlipidemia   . Hypertension    Social History   Social History Narrative  . Not on file   Social History   Tobacco Use  . Smoking status: Never Smoker  . Smokeless tobacco: Never Used  Substance Use Topics  . Alcohol use: No    Alcohol/week: 0.0 standard drinks  . Drug use: No   family history includes Cirrhosis in her mother; Heart disease in her father.     Objective:  BP 138/76 (BP Location: Right Arm, Patient Position: Sitting, Cuff Size: Normal)   Pulse 68   Temp 99.2 F (37.3 C) (Oral)   Resp 16   Ht 5\' 11"  (1.803 m)   Wt 174 lb 3.2 oz (79 kg)   SpO2 99%   BMI 24.30 kg/m  Body mass index is 24.3 kg/m.  Wt Readings from Last 3 Encounters:  11/29/17 174 lb 3.2 oz (79 kg)  11/17/17 179 lb (81.2 kg)  10/18/17 177 lb 9.6 oz (80.6 kg)    Physical Exam  Constitutional: She is oriented to person, place, and time. She appears well-developed and well-nourished.  HENT:  Head: Normocephalic and atraumatic.  Right Ear: Hearing, tympanic membrane, external ear and ear canal normal.  Left Ear: Hearing, tympanic membrane, external ear and ear canal normal.  Nose: Nose normal.  Mouth/Throat: Uvula is midline, oropharynx is clear and moist and mucous membranes are normal.  Eyes: Conjunctivae are normal.  Neck: Normal range of motion.  Cardiovascular: Normal rate, regular rhythm and normal heart sounds.  No murmur heard. Pulmonary/Chest: Effort normal and breath sounds normal. She has no wheezes.  Lymphadenopathy:       Head (right side): No tonsillar, no preauricular, no posterior auricular and no occipital adenopathy present.       Head (left side): No tonsillar, no preauricular, no posterior auricular and no occipital adenopathy present.    She has no cervical adenopathy.         Right: No supraclavicular adenopathy present.       Left: No supraclavicular adenopathy present.  Neurological: She is alert and oriented to person, place, and time.  Skin: Skin is warm and dry.  Psychiatric: She has a normal mood and affect. Her behavior is normal. Judgment and thought content normal.  Vitals reviewed.   Assessment and Plan :  URI with cough and congestion - Plan: guaiFENesin (MUCINEX) 600 MG 12 hr tablet symptomatic treatment to continue - gave warnings about liquid medications and sugar content.  Patient verbalized to me that they understand the following: diagnosis, what is  being done for them, what to expect and what should be done at home.  Their questions have been answered.  See after visit summary for patient specific instructions.  Windell Hummingbird PA-C  [facility] 11/29/2017 12:30 PM  Please note: Portions of this report may have been transcribed using dragon voice recognition software. Every effort was made to ensure accuracy; however, inadvertent computerized transcription errors may be present.

## 2017-12-04 ENCOUNTER — Other Ambulatory Visit: Payer: Self-pay | Admitting: Physician Assistant

## 2017-12-04 MED ORDER — AZITHROMYCIN 250 MG PO TABS: ORAL_TABLET | ORAL | 0 refills | Status: AC

## 2017-12-04 NOTE — Progress Notes (Signed)
Pt is feeling no better since her last OV - will call in [place] to cover bronchitis.

## 2017-12-17 ENCOUNTER — Other Ambulatory Visit: Payer: Self-pay

## 2017-12-17 ENCOUNTER — Encounter: Payer: Self-pay | Admitting: Physician Assistant

## 2017-12-17 ENCOUNTER — Ambulatory Visit (INDEPENDENT_AMBULATORY_CARE_PROVIDER_SITE_OTHER): Payer: Medicare HMO | Admitting: Physician Assistant

## 2017-12-17 ENCOUNTER — Telehealth: Payer: Self-pay | Admitting: Physician Assistant

## 2017-12-17 VITALS — BP 144/82 | HR 66 | Temp 98.3°F | Resp 16 | Ht 69.0 in | Wt 173.8 lb

## 2017-12-17 DIAGNOSIS — E118 Type 2 diabetes mellitus with unspecified complications: Secondary | ICD-10-CM | POA: Diagnosis not present

## 2017-12-17 LAB — POCT GLYCOSYLATED HEMOGLOBIN (HGB A1C): Hemoglobin A1C: 6 % — AB (ref 4.0–5.6)

## 2017-12-17 MED ORDER — METFORMIN HCL ER 750 MG PO TB24: 750.0000 mg | ORAL_TABLET | Freq: Two times a day (BID) | ORAL | 2 refills | Status: DC

## 2017-12-17 NOTE — Patient Instructions (Addendum)
Keep up the great work!   Start taking Metformin XR 750 mg TWICE/DAILY. Take this with meals.  You will receive a phone call to schedule an appointment with the nutritionist.  Come back and see me in 3 weeks for recheck of your symptoms.    Diabetes Mellitus and Nutrition When you have diabetes (diabetes mellitus), it is very important to have healthy eating habits because your blood sugar (glucose) levels are greatly affected by what you eat and drink. Eating healthy foods in the appropriate amounts, at about the same times every day, can help you:  Control your blood glucose.  Lower your risk of heart disease.  Improve your blood pressure.  Reach or maintain a healthy weight.  Every person with diabetes is different, and each person has different needs for a meal plan. Your health care provider may recommend that you work with a diet and nutrition specialist (dietitian) to make a meal plan that is best for you. Your meal plan may vary depending on factors such as:  The calories you need.  The medicines you take.  Your weight.  Your blood glucose, blood pressure, and cholesterol levels.  Your activity level.  Other health conditions you have, such as heart or kidney disease.  How do carbohydrates affect me? Carbohydrates affect your blood glucose level more than any other type of food. Eating carbohydrates naturally increases the amount of glucose in your blood. Carbohydrate counting is a method for keeping track of how many carbohydrates you eat. Counting carbohydrates is important to keep your blood glucose at a healthy level, especially if you use insulin or take certain oral diabetes medicines. It is important to know how many carbohydrates you can safely have in each meal. This is different for every person. Your dietitian can help you calculate how many carbohydrates you should have at each meal and for snack. Foods that contain carbohydrates include:  Bread, cereal,  rice, pasta, and crackers.  Potatoes and corn.  Peas, beans, and lentils.  Milk and yogurt.  Fruit and juice.  Desserts, such as cakes, cookies, ice cream, and candy.  How does alcohol affect me? Alcohol can cause a sudden decrease in blood glucose (hypoglycemia), especially if you use insulin or take certain oral diabetes medicines. Hypoglycemia can be a life-threatening condition. Symptoms of hypoglycemia (sleepiness, dizziness, and confusion) are similar to symptoms of having too much alcohol. If your health care provider says that alcohol is safe for you, follow these guidelines:  Limit alcohol intake to no more than 1 drink per day for nonpregnant women and 2 drinks per day for men. One drink equals 12 oz of beer, 5 oz of wine, or 1 oz of hard liquor.  Do not drink on an empty stomach.  Keep yourself hydrated with water, diet soda, or unsweetened iced tea.  Keep in mind that regular soda, juice, and other mixers may contain a lot of sugar and must be counted as carbohydrates.  What are tips for following this plan? Reading food labels  Start by checking the serving size on the label. The amount of calories, carbohydrates, fats, and other nutrients listed on the label are based on one serving of the food. Many foods contain more than one serving per package.  Check the total grams (g) of carbohydrates in one serving. You can calculate the number of servings of carbohydrates in one serving by dividing the total carbohydrates by 15. For example, if a food has 30 g of total carbohydrates,  it would be equal to 2 servings of carbohydrates.  Check the number of grams (g) of saturated and trans fats in one serving. Choose foods that have low or no amount of these fats.  Check the number of milligrams (mg) of sodium in one serving. Most people should limit total sodium intake to less than 2,300 mg per day.  Always check the nutrition information of foods labeled as "low-fat" or  "nonfat". These foods may be higher in added sugar or refined carbohydrates and should be avoided.  Talk to your dietitian to identify your daily goals for nutrients listed on the label. Shopping  Avoid buying canned, premade, or processed foods. These foods tend to be high in fat, sodium, and added sugar.  Shop around the outside edge of the grocery store. This includes fresh fruits and vegetables, bulk grains, fresh meats, and fresh dairy. Cooking  Use low-heat cooking methods, such as baking, instead of high-heat cooking methods like deep frying.  Cook using healthy oils, such as olive, canola, or sunflower oil.  Avoid cooking with butter, cream, or high-fat meats. Meal planning  Eat meals and snacks regularly, preferably at the same times every day. Avoid going long periods of time without eating.  Eat foods high in fiber, such as fresh fruits, vegetables, beans, and whole grains. Talk to your dietitian about how many servings of carbohydrates you can eat at each meal.  Eat 4-6 ounces of lean protein each day, such as lean meat, chicken, fish, eggs, or tofu. 1 ounce is equal to 1 ounce of meat, chicken, or fish, 1 egg, or 1/4 cup of tofu.  Eat some foods each day that contain healthy fats, such as avocado, nuts, seeds, and fish. Lifestyle   Check your blood glucose regularly.  Exercise at least 30 minutes 5 or more days each week, or as told by your health care provider.  Take medicines as told by your health care provider.  Do not use any products that contain nicotine or tobacco, such as cigarettes and e-cigarettes. If you need help quitting, ask your health care provider.  Work with a Social worker or diabetes educator to identify strategies to manage stress and any emotional and social challenges. What are some questions to ask my health care provider?  Do I need to meet with a diabetes educator?  Do I need to meet with a dietitian?  What number can I call if I have  questions?  When are the best times to check my blood glucose? Where to find more information:  American Diabetes Association: diabetes.org/food-and-fitness/food  Academy of Nutrition and Dietetics: PokerClues.dk  [employer] of Diabetes and Digestive and Kidney Diseases (NIH): ContactWire.be Summary  A healthy meal plan will help you control your blood glucose and maintain a healthy lifestyle.  Working with a diet and nutrition specialist (dietitian) can help you make a meal plan that is best for you.  Keep in mind that carbohydrates and alcohol have immediate effects on your blood glucose levels. It is important to count carbohydrates and to use alcohol carefully. This information is not intended to replace advice given to you by your health care provider. Make sure you discuss any questions you have with your health care provider. Document Released: 01/05/2005 Document Revised: 05/15/2016 Document Reviewed: 05/15/2016 Elsevier Interactive Patient Education  [employer].   Thank you for coming in today. I hope you feel we met your needs.  Feel free to call PCP if you have any questions or  further requests.  Please consider signing up for MyChart if you do not already have it, as this is a great way to communicate with me.  Best,  Whitney McVey, PA-C  IF you received an x-ray today, you will receive an invoice from [facility]. Please contact [facility]iology at [phone] with questions or concerns regarding your invoice.   IF you received labwork today, you will receive an invoice from [place]. Please contact [facility] at [phone] with questions or concerns regarding your invoice.   Our billing staff will not be able to assist you with questions regarding bills from these companies.  You will be contacted with the lab results  as soon as they are available. The fastest way to get your results is to activate your My Chart account. Instructions are located on the last page of this paperwork. If you have not heard from [place] regarding the results in 2 weeks, please contact this office.

## 2017-12-17 NOTE — Telephone Encounter (Signed)
Copied from [place] [id number]. Topic: Quick Communication - See Telephone Encounter >> Dec 17, 2017  3:10 PM Gardiner Ramus wrote: CRM for notification. See Telephone encounter for: 12/17/17. Pt daughter called and stated that metFORMIN (GLUCOPHAGE-XR) 750 MG 24 hr tablet [[id number]]  pills are too big to take for patient. She would like something smaller called in. Please advise

## 2017-12-17 NOTE — Progress Notes (Signed)
Tonya [name]  MRN: [id number] DOB: 04/23/1941  PCP: Mancel Bale, PA-C  Subjective:  Pt is a 77 year old female who presents to clinic for abdominal pain after she takes her DM medication x several months. She is a new pt to me. Diagnosed with DM earlier this year. Last OV for DM was with Windell Hummingbird on 7/27  Medications are Dapagliflozin-Metformin ER 2.08-998 mg bid and insulin. She is not taking insulin as prescribed a few months ago "God hasn't told me I need that".  She is taking Xigduo once daily rather than bid.   "that big horse pill and it don't sit with my stomach good". Abdominal pain since she started the pill. She takes pill after meals.  "my stomach just don't feel good when I take the Metformin, It's hard to swallow the pill, stomach stays uncomfortable all day".  Pt endorses noticing constipation when she started taking the Metformin.  She endorses continued nervousness and confusion regarding DM-friendly eating"I really don't know what to eat".   Average home sugars are 160-200.   She has lost about 20lbs since starting medications.   Lab Results  Component Value Date   HGBA1C 10.0 (A) 10/11/2017   Wt Readings from Last 3 Encounters:  12/17/17 173 lb 12.8 oz (78.8 kg)  11/29/17 174 lb 3.2 oz (79 kg)  11/17/17 179 lb (81.2 kg)    Review of Systems  Gastrointestinal: Positive for abdominal pain and constipation. Negative for diarrhea and vomiting.  Endocrine: Negative for polydipsia, polyphagia and polyuria.  Neurological: Negative for dizziness, light-headedness and headaches.    Patient Active Problem List   Diagnosis Date Noted  . D-dimer, elevated 10/11/2017  . Leg edema, left 10/10/2017  . Lesion of lip 10/10/2017  . Type 2 diabetes mellitus with complication, without long-term current use of insulin ([place]) 07/19/2017  . Vitamin D deficiency 07/17/2017  . Spinal stenosis of lumbar region 01/23/2017  . Frequent PVCs 11/23/2016  . Heart murmur  09/19/2016  . Iron deficiency anemia 05/17/2016  . Chronic right hip pain 06/08/2015  . Nonspecific abnormal electrocardiogram (ECG) (EKG) 02/23/2015  . Bradycardia 10/20/2014  . Routine general medical examination at a health care facility 01/02/2013  . Hypertonicity of bladder 12/20/2009  . Hyperlipidemia with target LDL less than 100 12/14/2008  . GERD 11/03/2008  . Right lumbar radiculitis 05/18/2008  . Essential hypertension 07/26/2007  . COPD 07/26/2007  . CVA, old, hemiparesis ([place]) 07/26/2007    Current Outpatient Medications on File Prior to Visit  Medication Sig Dispense Refill  . aspirin 81 MG tablet Take 81 mg by mouth daily.      Marland Kitchen atorvastatin (LIPITOR) 10 MG tablet Take 1 tablet (10 mg total) by mouth daily. 90 tablet 1  . cholestyramine (QUESTRAN) 4 G packet DISSOLVE 1 PACKET INTO 2 TO 6 OUNCES OF WATER AND DRINK 2 TIMES A DAY. 60 packet 11  . Dapagliflozin-metFORMIN HCl ER (XIGDUO XR) 2.08-998 MG TB24 Take 2 tablets by mouth daily. 180 tablet 1  . diltiazem (CARDIZEM CD) 180 MG 24 hr capsule TAKE 1 CAPSULE DAILY 90 capsule 1  . ferrous sulfate 325 (65 FE) MG tablet Take 1 tablet (325 mg total) by mouth 2 (two) times daily with a meal. 180 tablet 1  . hydrochlorothiazide (HYDRODIURIL) 25 MG tablet Take 1 tablet (25 mg total) by mouth daily. 90 tablet 1  . metoprolol tartrate (LOPRESSOR) 25 MG tablet Take 1 tablet (25 mg total) by mouth 2 (two)  times daily. 60 tablet 0  . metoprolol tartrate (LOPRESSOR) 25 MG tablet TAKE 1 TABLET TWICE A DAY 180 tablet 1  . ONETOUCH VERIO test strip USE TO TEST BLOOD SUGAR UP TO FOUR TIMES DAILY 100 each 5  . ONETOUCH VERIO test strip 1 each by Other route 3 (three) times daily. USE TO TEST BLOOD SUGAR UP TO QID 100 each 0  . Calcium Carbonate-Vitamin D (CALCIUM 600 + D PO) Take by mouth daily.      . Cholecalciferol 2000 units TABS Take 1 tablet (2,000 Units total) by mouth daily. (Patient not taking: Reported on 12/17/2017) 90 tablet 1    . cyanocobalamin 2000 MCG tablet Take 1 tablet (2,000 mcg total) by mouth daily. (Patient not taking: Reported on 12/17/2017) 90 tablet 3   No current facility-administered medications on file prior to visit.     Allergies  Allergen Reactions  . Hydrocodone   . Iohexol      Code: HIVES, Desc: PT developed hive and fullness in throat post injection of 125cc's Omni 300, Onset Date: [id number]   . Naproxen   . Propoxyphene N-Acetaminophen      Objective:  BP (!) 144/82 (BP Location: Right Arm, Patient Position: Sitting, Cuff Size: Normal)   Pulse 66   Temp 98.3 F (36.8 C) (Oral)   Resp 16   Ht 5\' 9"  (1.753 m)   Wt 173 lb 12.8 oz (78.8 kg)   SpO2 98%   BMI 25.67 kg/m   Physical Exam  Constitutional: She is oriented to person, place, and time. No distress.  Cardiovascular: Normal rate, regular rhythm and normal heart sounds.  Neurological: She is alert and oriented to person, place, and time.  Skin: Skin is warm and dry.  Psychiatric: Judgment normal.  Vitals reviewed.   Results for orders placed or performed in visit on 12/17/17  POCT glycosylated hemoglobin (Hb A1C)  Result Value Ref Range   Hemoglobin A1C 6.0 (A) 4.0 - 5.6 %   HbA1c POC (<> result, manual entry)     HbA1c, POC (prediabetic range)     HbA1c, POC (controlled diabetic range)      Assessment and Plan :  1. Type 2 diabetes mellitus with complication, without long-term current use of insulin (HCC) - Pt presents c/o abdominal pain after taking Xigduo. She was diagnosed with TIIDM earlier this year. This is my first time seeing this pt. A1C 6/20 was 10. Today A1C is 6.0. She continues to have anxiety regarding DM diet - plan to refer to nutritionist for detailed information. Plan to change medication from xigduo to Metformin 750mg  bid. RTC in 3 weeks to recheck symptoms.  - POCT glycosylated hemoglobin (Hb A1C) - Ambulatory referral to diabetic education - metFORMIN (GLUCOPHAGE-XR) 750 MG 24 hr tablet; Take 1  tablet (750 mg total) by mouth 2 (two) times daily with a meal.  Dispense: 60 tablet; Refill: 2  Mercer Pod, PA-C  [facility] [place] 12/17/2017 11:32 AM  Please note: Portions of this report may have been transcribed using dragon voice recognition software. Every effort was made to ensure accuracy; however, inadvertent computerized transcription errors may be present.

## 2017-12-18 NOTE — Telephone Encounter (Signed)
Patient called and said that she can not crush the pill. She said that it does not have a line in the middle of the pill, so she is asking for something smaller or a capsule. She said those are horse pills and can not swallow it. Please advise.

## 2017-12-18 NOTE — Telephone Encounter (Signed)
Patient checking status. Please advise

## 2017-12-18 NOTE — Telephone Encounter (Signed)
Please advise 

## 2017-12-18 NOTE — Telephone Encounter (Signed)
Call pharmacy and see if metformin regular release can be crushed and if it can we can call her in that formulation.  Metformin 500mg  - 2 po bid #120 with food -- if this is what she needs to do to be able to crush the pills have her start with 1 pill bid for a week for tolerability and then increase if she tolerates

## 2017-12-20 ENCOUNTER — Other Ambulatory Visit: Payer: Self-pay | Admitting: Physician Assistant

## 2017-12-20 ENCOUNTER — Other Ambulatory Visit: Payer: Self-pay

## 2017-12-20 DIAGNOSIS — E118 Type 2 diabetes mellitus with unspecified complications: Secondary | ICD-10-CM

## 2017-12-20 MED ORDER — ONETOUCH VERIO VI STRP: 1.0000 | ORAL_STRIP | Freq: Three times a day (TID) | 0 refills | Status: DC

## 2017-12-20 MED ORDER — METFORMIN HCL 500 MG PO TABS: 500.0000 mg | ORAL_TABLET | Freq: Two times a day (BID) | ORAL | 0 refills | Status: DC

## 2017-12-20 NOTE — Telephone Encounter (Signed)
IC pharmacist - She states 500mg  Metformin are about the size of M$M's and CAN be crushed.  The 750 XR are the large tablets and cannot be crushed.   I spoke with pt.  Advised we were changing her medicine and pills were the size of M&M.  Advised to start with 1 tab BID and after 7 days change to 2 tabs BID - advise if she has issues with tolerating med.  Sent in order for #120 and -0- refills.

## 2017-12-20 NOTE — Telephone Encounter (Addendum)
If the previous message are reviewed you could see that I wanted the pharmacy called the see if Metformin reg release could be cut - if this would be done then we could call the patient back with information.  The pills that she has now can not be cut or crushed because they are extended release.

## 2017-12-20 NOTE — Telephone Encounter (Signed)
Great!

## 2017-12-27 ENCOUNTER — Telehealth: Payer: Self-pay | Admitting: Physician Assistant

## 2017-12-27 NOTE — Telephone Encounter (Signed)
Tonya [name] is needing additional information in order to approve prescription for One Touch Verio Strips. Form is in providers box.

## 2017-12-28 ENCOUNTER — Telehealth: Payer: Self-pay | Admitting: Physician Assistant

## 2017-12-28 NOTE — Telephone Encounter (Signed)
Copied from [place] [id number]. Topic: Quick Communication - See Telephone Encounter >> Dec 28, 2017 10:04 AM Mylinda Latina, NT wrote: CRM for notification. See Telephone encounter for: 12/28/17. Tonya [name] calling from [facility] states they need more clinical information pertaining to the [facility] VERIO test strip in order to get his approved.  They are needing quantity information. Please call CB# [phone] case # [id number]

## 2017-12-28 NOTE — Telephone Encounter (Signed)
Please see note below and advise  

## 2017-12-31 NOTE — Telephone Encounter (Signed)
She should be testing once daily.

## 2017-12-31 NOTE — Telephone Encounter (Signed)
Please see note below. The need a response asap or the will deny request

## 2018-01-01 NOTE — Telephone Encounter (Signed)
Called and the test strips were approved yesterday. I will follow up with pharmacy and patient.

## 2018-01-04 ENCOUNTER — Telehealth: Payer: Self-pay | Admitting: Physician Assistant

## 2018-01-04 NOTE — Telephone Encounter (Signed)
Tonya [name] is needing additional information in regards to a PA for One Touch Verio Strips. Placed in providers box.

## 2018-01-08 ENCOUNTER — Encounter (HOSPITAL_COMMUNITY): Payer: Medicare HMO

## 2018-01-08 ENCOUNTER — Encounter: Payer: Medicare HMO | Admitting: Vascular Surgery

## 2018-01-08 NOTE — Telephone Encounter (Signed)
Please see note below. 

## 2018-01-08 NOTE — Telephone Encounter (Signed)
Last seen by you for DM

## 2018-01-14 ENCOUNTER — Other Ambulatory Visit: Payer: Self-pay | Admitting: Internal Medicine

## 2018-01-14 ENCOUNTER — Other Ambulatory Visit: Payer: Self-pay | Admitting: Physician Assistant

## 2018-01-14 DIAGNOSIS — R69 Illness, unspecified: Secondary | ICD-10-CM | POA: Diagnosis not present

## 2018-01-14 DIAGNOSIS — E785 Hyperlipidemia, unspecified: Secondary | ICD-10-CM

## 2018-01-14 NOTE — Telephone Encounter (Signed)
Atorvastatin 10 mg  refill Last Refill:07/19/17 # 90 and one refill Last OV: 12/17/17 at [facility] Care at [facility] and 10/11/17 at [facility] PCP: [place]: [facility] [phone]  Last lipid panel was 07/17/17

## 2018-01-15 ENCOUNTER — Encounter: Payer: Self-pay | Admitting: Registered"

## 2018-01-15 ENCOUNTER — Encounter: Payer: Medicare HMO | Attending: Physician Assistant | Admitting: Registered"

## 2018-01-15 DIAGNOSIS — E118 Type 2 diabetes mellitus with unspecified complications: Secondary | ICD-10-CM | POA: Insufficient documentation

## 2018-01-15 DIAGNOSIS — Z713 Dietary counseling and surveillance: Secondary | ICD-10-CM | POA: Insufficient documentation

## 2018-01-15 DIAGNOSIS — E119 Type 2 diabetes mellitus without complications: Secondary | ICD-10-CM

## 2018-01-15 NOTE — Progress Notes (Signed)
Diabetes Self-Management Education  Visit Type:  First/Initial  Appt. Start Time: 2:03 Appt. End Time: 3:22  01/15/2018  [name], identified by name and date of birth, is a 77 y.o. female with a diagnosis of Diabetes: Type 2.   ASSESSMENT  Pt expectations: wants to know what to eat  Pt states she has not eaten breakfast in years. Pt states she recently started drinking protein shakes for breakfast because a friend told her to. Pt states she loves fruit and will eat fruit, popcorn while watching tv at night. Pt states her son prepares meals for her. Pt states she does not like plain water.   Pt states she was afraid when initially diagnosed with diabetes because she does not like needles and knew others who were taking insulin that had amputations.   There were no vitals taken for this visit. There is no height or weight on file to calculate BMI.   Diabetes Self-Management Education - 01/15/18 1411      Initial Visit   What type of meal plan do you follow?  my own      Health Coping   How would you rate your overall health?  Good      Psychosocial Assessment   Patient Belief/Attitude about Diabetes  Afraid    Self-care barriers  None    Self-management support  Family;Church    Special Needs  None    Preferred Learning Style  No preference indicated    Learning Readiness  Ready      Complications   Last HgB A1C per patient/outside source  6 %    How often do you check your blood sugar?  1-2 times/day   Checks every other day   Fasting Blood glucose range (mg/dL)  130-179   152-183   Postprandial Blood glucose range (mg/dL)  130-179   141-190   Number of hypoglycemic episodes per month  0    Number of hyperglycemic episodes per week  0    Have you had a dilated eye exam in the past 12 months?  Yes    Have you had a dental exam in the past 12 months?  Yes    Are you checking your feet?  Yes    How many days per week are you checking your feet?  1      Dietary  Intake   Breakfast  premier protein shake    Snack (morning)  none    Lunch  collards + green beans + onions + tomatoes + cucumbers + fruit    Snack (afternoon)  sometimes walnuts    Dinner  collards + green beans + onions + tomatoes + cucumbers + fruit    Snack (evening)  popcorn + grapes or walnuts    Beverage(s)  coffee, protein shakes, ginger ale, water with energy flavor pack      Exercise   Exercise Type  Moderate (swimming / aerobic walking)   rides stationary bike at home    How many days per week to you exercise?  5    How many minutes per day do you exercise?  30    Total minutes per week of exercise  150      Patient Education   Disease state   Definition of diabetes, type 1 and 2, and the diagnosis of diabetes    Nutrition management   Reviewed blood glucose goals for pre and post meals and how to evaluate the patients' food intake on their  blood glucose level.;Role of diet in the treatment of diabetes and the relationship between the three main macronutrients and blood glucose level    Monitoring  Purpose and frequency of SMBG.;Taught/discussed recording of test results and interpretation of SMBG.;Identified appropriate SMBG and/or A1C goals.    Acute complications  Taught treatment of hypoglycemia - the 15 rule.;Discussed and identified patients' treatment of hyperglycemia.    Psychosocial adjustment  Role of stress on diabetes      Individualized Goals (developed by patient)   Nutrition  General guidelines for healthy choices and portions discussed    Physical Activity  Exercise 3-5 times per week    Medications  take my medication as prescribed    Monitoring   test my blood glucose as discussed    Reducing Risk  examine blood glucose patterns;treat hypoglycemia with 15 grams of carbs if blood glucose less than 70mg /dL      Post-Education Assessment   Patient understands the diabetes disease and treatment process.  Demonstrates understanding / competency    Patient  understands incorporating nutritional management into lifestyle.  Demonstrates understanding / competency    Patient undertands incorporating physical activity into lifestyle.  Needs Review    Patient understands using medications safely.  Demonstrates understanding / competency    Patient understands monitoring blood glucose, interpreting and using results  Needs Review    Patient understands prevention, detection, and treatment of acute complications.  Needs Review    Patient understands prevention, detection, and treatment of chronic complications.  Needs Instruction    Patient understands how to develop strategies to address psychosocial issues.  Needs Instruction    Patient understands how to develop strategies to promote health/change behavior.  Needs Instruction      Outcomes   Program Status  Not Completed       Learning Objective:  Patient will have a greater understanding of diabetes self-management. Patient education plan is to attend individual and/or group sessions per assessed needs and concerns.   Plan:   Patient Instructions  - Aim to have carbohydrate and protein shake for breakfast.   - Eat breakfast before exercise in the morning.   - Snacks between meals can be carbohydrate and protein together such as fruit + nuts or cheese or peanut butter.       Expected Outcomes:  Demonstrated interest in learning. Expect positive outcomes  Education material provided: ADA Diabetes: Your Take Control Guide  If problems or questions, patient to contact team via:  Phone and Email  Future DSME appointment: - 4-6 wks

## 2018-01-15 NOTE — Patient Instructions (Signed)
-   Aim to have carbohydrate and protein shake for breakfast.   - Eat breakfast before exercise in the morning.   - Snacks between meals can be carbohydrate and protein together such as fruit + nuts or cheese or peanut butter.

## 2018-01-27 DIAGNOSIS — R69 Illness, unspecified: Secondary | ICD-10-CM | POA: Diagnosis not present

## 2018-02-14 ENCOUNTER — Ambulatory Visit: Payer: Medicare HMO | Admitting: Registered"

## 2018-02-19 NOTE — Telephone Encounter (Signed)
error 

## 2018-03-06 ENCOUNTER — Telehealth: Payer: Self-pay | Admitting: Physician Assistant

## 2018-03-06 MED ORDER — ONETOUCH VERIO VI STRP: 1.0000 | ORAL_STRIP | Freq: Three times a day (TID) | 0 refills | Status: DC

## 2018-03-06 NOTE — Telephone Encounter (Signed)
Below is the incorrect supply request- Patient is requesting lancets.

## 2018-03-06 NOTE — Telephone Encounter (Signed)
Copied from [place] [id number]. Topic: Quick Communication - Rx Refill/Question >> Mar 06, 2018 12:34 PM Leward Quan A wrote: Medication: Roma Schanz test strip   Has the patient contacted their pharmacy? Yes   Preferred Pharmacy (with phone number or street name): [facility] [place], [address] [place] Mannan [phone] (Phone) [phone] (Fax)    Agent: Please be advised that RX refills may take up to 3 business days. We ask that you follow-up with your pharmacy.

## 2018-03-06 NOTE — Telephone Encounter (Signed)
Request is not on pt medication list; will route to office for final disposition; pt last seen by [facility] 12/17/17.

## 2018-03-11 NOTE — Telephone Encounter (Signed)
Pharmacy called and stated there are conflicting instructions regarding the Rx forONETOUCH VERIO test strip   TID vs QID  In order to fill it they need clarification or it will be placed on hold until they receive it. They stated this is the 3rd attempt. Please advise CB# [phone] reference # [id number]

## 2018-03-19 ENCOUNTER — Other Ambulatory Visit: Payer: Self-pay | Admitting: Physician Assistant

## 2018-03-19 NOTE — Telephone Encounter (Signed)
Strips should be twice daily. Please update pharmacy. Thank you

## 2018-03-22 ENCOUNTER — Other Ambulatory Visit: Payer: Self-pay | Admitting: *Deleted

## 2018-03-22 ENCOUNTER — Telehealth: Payer: Self-pay | Admitting: Physician Assistant

## 2018-03-22 DIAGNOSIS — R69 Illness, unspecified: Secondary | ICD-10-CM | POA: Diagnosis not present

## 2018-03-22 MED ORDER — ONETOUCH ULTRASOFT LANCETS MISC: 12 refills | Status: DC

## 2018-03-22 NOTE — Progress Notes (Unsigned)
Patient called wanting a refill on the lancets.  States she has ran out.  Patient verified pharmacy and was provided with a refill

## 2018-03-22 NOTE — Telephone Encounter (Signed)
Contacted by Allene Dillon, Pharmacist at [facility]; she is calling to clarify order of prescription for lancets; clarified with Shanda that prescription dated 03/22/18 is accurate; notified Larene Beach; will resend prescription to pharmacy.     Requested Prescriptions  Pending Prescriptions Disp Refills  . Lancets (ONETOUCH ULTRASOFT) lancets 100 each 12    Sig: Prick finger 2 times a day to test blood glucose level     There is no refill protocol information for this order

## 2018-03-24 ENCOUNTER — Other Ambulatory Visit: Payer: Self-pay | Admitting: Internal Medicine

## 2018-03-25 ENCOUNTER — Other Ambulatory Visit: Payer: Self-pay

## 2018-03-25 NOTE — Telephone Encounter (Signed)
Fax req for HCTZ 25mg  tabs Last prescribed by Vladimir Crofts MD 05/2017  Reviewed chart.  No mention of HCTZ. Sent to [facility]  To advise.

## 2018-03-27 NOTE — Telephone Encounter (Signed)
Pt needs to be seen. Please schedule for DM and HTN check with one of the full-time providers. Thank you

## 2018-03-28 ENCOUNTER — Ambulatory Visit: Payer: Self-pay | Admitting: *Deleted

## 2018-03-28 ENCOUNTER — Telehealth: Payer: Self-pay | Admitting: Physician Assistant

## 2018-03-28 NOTE — Telephone Encounter (Signed)
LVM for pt to call the office and set up an appt with one of our full time providers as [facility] is not seeing pts full time. Pt will need to be seen in order to get the requested med refill. Please schedule with Marga Hoots or [place]. Thank you!

## 2018-03-28 NOTE — Telephone Encounter (Signed)
Pt's daughter called back and I spoke with her regarding making her mother an appt. (per DPR). I was able to get the pt an appt on Saturday 03/30/18 with Dr. Kenton Kingfisher in order to get a med refill. Pt will be seeing Dr. Nolon Rod on 05/03/18 to establish care.

## 2018-03-28 NOTE — Telephone Encounter (Signed)
  Patient is calling to report she is having upper abdominal pain that comes and goes. Patient describes the pain as mild and she states it started during the night. Patient states she has to arrange her transportation and she can come to the office on Monday- she will only accept an appointment with Nolon Rod or Carlota Raspberry. Patient declines any other appointment at this time with any other provider. Call to office to discuss this patient- they recommend that the message be forwarded for provider review. Patient advised that if the office calls her back- she should accept appointment offered- I do understand she has transportation issues and this is a real problem for her- but not knowing what is causing her pain is a concern. Patient states she knows when to call the EMS. Patient contact# [phone] Reason for Disposition . [1] MILD-MODERATE pain AND [2] not relieved by antacids    Patient is very determined to come to office an not ED or UC. She wants an appointment for Monday when she has transportation.  Answer Assessment - Initial Assessment Questions 1. LOCATION: "Where does it hurt?"      Top of stomach 2. RADIATION: "Does the pain shoot anywhere else?" (e.g., chest, back)     no 3. ONSET: "When did the pain begin?" (e.g., minutes, hours or days ago)      Started yesterday 4. SUDDEN: "Gradual or sudden onset?"     sudden 5. PATTERN "Does the pain come and go, or is it constant?"    - If constant: "Is it getting better, staying the same, or worsening?"      (Note: Constant means the pain never goes away completely; most serious pain is constant and it progresses)     - If intermittent: "How long does it last?" "Do you have pain now?"     (Note: Intermittent means the pain goes away completely between bouts)     Comes and goes 6. SEVERITY: "How bad is the pain?"  (e.g., Scale 1-10; mild, moderate, or severe)    - MILD (1-3): doesn't interfere with normal activities, abdomen soft and not  tender to touch     - MODERATE (4-7): interferes with normal activities or awakens from sleep, tender to touch     - SEVERE (8-10): excruciating pain, doubled over, unable to do any normal activities       mild 7. RECURRENT SYMPTOM: "Have you ever had this type of abdominal pain before?" If so, ask: "When was the last time?" and "What happened that time?"      Years ago- patient states she does not remember the treatment 8. AGGRAVATING FACTORS: "Does anything seem to cause this pain?" (e.g., foods, stress, alcohol)     Pain was not present when she ate 9. CARDIAC SYMPTOMS: "Do you have any of the following symptoms: chest pain, difficulty breathing, sweating, nausea?"     no 10. OTHER SYMPTOMS: "Do you have any other symptoms?" (e.g., fever, vomiting, diarrhea)       no 11. PREGNANCY: "Is there any chance you are pregnant?" "When was your last menstrual period?"       n/a  Protocols used: ABDOMINAL PAIN - UPPER-A-AH

## 2018-03-30 ENCOUNTER — Other Ambulatory Visit: Payer: Self-pay

## 2018-03-30 ENCOUNTER — Encounter: Payer: Self-pay | Admitting: Family Medicine

## 2018-03-30 ENCOUNTER — Ambulatory Visit (INDEPENDENT_AMBULATORY_CARE_PROVIDER_SITE_OTHER): Payer: Medicare HMO | Admitting: Family Medicine

## 2018-03-30 VITALS — BP 138/70 | HR 82 | Temp 97.9°F | Ht 71.0 in | Wt 173.6 lb

## 2018-03-30 DIAGNOSIS — I10 Essential (primary) hypertension: Secondary | ICD-10-CM | POA: Diagnosis not present

## 2018-03-30 DIAGNOSIS — Z1329 Encounter for screening for other suspected endocrine disorder: Secondary | ICD-10-CM

## 2018-03-30 DIAGNOSIS — R634 Abnormal weight loss: Secondary | ICD-10-CM | POA: Diagnosis not present

## 2018-03-30 DIAGNOSIS — E785 Hyperlipidemia, unspecified: Secondary | ICD-10-CM

## 2018-03-30 DIAGNOSIS — E1169 Type 2 diabetes mellitus with other specified complication: Secondary | ICD-10-CM | POA: Diagnosis not present

## 2018-03-30 DIAGNOSIS — R202 Paresthesia of skin: Secondary | ICD-10-CM | POA: Diagnosis not present

## 2018-03-30 DIAGNOSIS — E559 Vitamin D deficiency, unspecified: Secondary | ICD-10-CM | POA: Diagnosis not present

## 2018-03-30 DIAGNOSIS — R7309 Other abnormal glucose: Secondary | ICD-10-CM | POA: Diagnosis not present

## 2018-03-30 MED ORDER — ATORVASTATIN CALCIUM 10 MG PO TABS: ORAL_TABLET | ORAL | 1 refills | Status: DC

## 2018-03-30 MED ORDER — HYDROCHLOROTHIAZIDE 25 MG PO TABS: 25.0000 mg | ORAL_TABLET | Freq: Every day | ORAL | 1 refills | Status: DC

## 2018-03-30 MED ORDER — GLIPIZIDE 5 MG PO TABS: 2.5000 mg | ORAL_TABLET | Freq: Every day | ORAL | 3 refills | Status: DC

## 2018-03-30 MED ORDER — METFORMIN HCL 500 MG PO TABS: 500.0000 mg | ORAL_TABLET | Freq: Two times a day (BID) | ORAL | 3 refills | Status: DC

## 2018-03-30 NOTE — Progress Notes (Signed)
Patient ID: Tonya [name], female    DOB: [birth year], 77 y.o.   MRN: [id number]  PCP: Forrest Moron, MD  Chief Complaint  Patient presents with  . Medication Refill    hctz,atrovastatin  . Medication Reaction    metformin     Subjective:  HPI  Tonya [name] is a 77 y.o. female presents for a medication refill and has multiple complaints. She is scheduled to establish with Dr. Delia Chimes in January, however will run out of medication prior to that appointment. She is concern today regarding her metformin causing constipation and difficulty swallowing due to the size of the pill. GI distress. In review of EMR, previous provider had attempted to change medication to immediate release in order for table to be split in half. At present, patient is prescribed the metformin 850 ER and hasn't been taking consistently given problems mentioned.  She is concerned that her blood sugars have been high.  Although is uncertain if she is actually checking the blood sugars immediately after eating and or fasting.  She reports her blood sugars have been in the upper 100s and low 200s at times.  Does not have her meter present with her in office today. She denies chest pain, shortness of breath, wheezing, edema, or new weakness.  She is fasting today and would like to go ahead and have her labs drawn for her upcoming visit with Dr. Nolon Rod.  Social History   Socioeconomic History  . Marital status: Widowed    Spouse name: Not on file  . Number of children: Not on file  . Years of education: Not on file  . Highest education level: Not on file  Occupational History  . Not on file  Social Needs  . Financial resource strain: Not on file  . Food insecurity:    Worry: Not on file    Inability: Not on file  . Transportation needs:    Medical: Not on file    Non-medical: Not on file  Tobacco Use  . Smoking status: Never Smoker  . Smokeless tobacco: Never Used  Substance and Sexual Activity  .  Alcohol use: No    Alcohol/week: 0.0 standard drinks  . Drug use: No  . Sexual activity: Not Currently  Lifestyle  . Physical activity:    Days per week: Not on file    Minutes per session: Not on file  . Stress: Not on file  Relationships  . Social connections:    Talks on phone: Not on file    Gets together: Not on file    Attends religious service: Not on file    Active member of club or organization: Not on file    Attends meetings of clubs or organizations: Not on file    Relationship status: Not on file  . Intimate partner violence:    Fear of current or ex partner: Not on file    Emotionally abused: Not on file    Physically abused: Not on file    Forced sexual activity: Not on file  Other Topics Concern  . Not on file  Social History Narrative  . Not on file    Family History  Problem Relation Age of Onset  . Cirrhosis Mother   . Heart disease Father   . Cancer Other   . Hypertension Other   . Diabetes Other      Review of Systems  Patient Active Problem List   Diagnosis Date Noted  . D-dimer,  elevated 10/11/2017  . Leg edema, left 10/10/2017  . Lesion of lip 10/10/2017  . Type 2 diabetes mellitus with complication, without long-term current use of insulin ([place]) 07/19/2017  . Vitamin D deficiency 07/17/2017  . Spinal stenosis of lumbar region 01/23/2017  . Frequent PVCs 11/23/2016  . Heart murmur 09/19/2016  . Iron deficiency anemia 05/17/2016  . Chronic right hip pain 06/08/2015  . Nonspecific abnormal electrocardiogram (ECG) (EKG) 02/23/2015  . Bradycardia 10/20/2014  . Routine general medical examination at a health care facility 01/02/2013  . Hypertonicity of bladder 12/20/2009  . Hyperlipidemia with target LDL less than 100 12/14/2008  . GERD 11/03/2008  . Right lumbar radiculitis 05/18/2008  . Essential hypertension 07/26/2007  . COPD 07/26/2007  . CVA, old, hemiparesis ([place]) 07/26/2007    Allergies  Allergen Reactions  . Hydrocodone    . Iohexol      Code: HIVES, Desc: PT developed hive and fullness in throat post injection of 125cc's Omni 300, Onset Date: 35465681   . Naproxen   . Propoxyphene N-Acetaminophen     Prior to Admission medications   Medication Sig Start Date End Date Taking? Authorizing Provider  aspirin 81 MG tablet Take 81 mg by mouth daily.     Yes [provider]  atorvastatin (LIPITOR) 10 MG tablet TAKE 1 TABLET(10 MG) BY MOUTH DAILY 01/14/18  Yes Janith Lima, MD  Calcium Carbonate-Vitamin D (CALCIUM 600 + D PO) Take by mouth daily.     Yes [provider]  Cholecalciferol 2000 units TABS Take 1 tablet (2,000 Units total) by mouth daily. 07/17/17  Yes Janith Lima, MD  cholestyramine Lucrezia Starch) 4 G packet DISSOLVE 1 PACKET INTO 2 TO 6 OUNCES OF WATER AND DRINK 2 TIMES A DAY. 01/05/15  Yes Janith Lima, MD  cyanocobalamin 2000 MCG tablet Take 1 tablet (2,000 mcg total) by mouth daily. 06/14/15  Yes Janith Lima, MD  Dapagliflozin-metFORMIN HCl ER (XIGDUO XR) 2.08-998 MG TB24 Take 2 tablets by mouth daily. 10/12/17  Yes Janith Lima, MD  diltiazem (CARDIZEM CD) 180 MG 24 hr capsule TAKE 1 CAPSULE DAILY 11/08/17  Yes Janith Lima, MD  ferrous sulfate 325 (65 FE) MG tablet Take 1 tablet (325 mg total) by mouth 2 (two) times daily with a meal. 07/17/17  Yes Janith Lima, MD  hydrochlorothiazide (HYDRODIURIL) 25 MG tablet Take 1 tablet (25 mg total) by mouth daily. 06/18/17  Yes Binnie Rail, MD  Lancets [facility] ULTRASOFT) lancets Prick finger 2 times a day to test blood glucose level 03/22/18  Yes McVey, Gelene Mink, PA-C  metFORMIN (GLUCOPHAGE) 500 MG tablet Take 1 tablet (500 mg total) by mouth 2 (two) times daily with a meal. For 7 days, then change to 2 tabs(1000mg ) 2 (two) times daily 12/20/17  Yes Weber, Sarah L, PA-C  metFORMIN (GLUCOPHAGE-XR) 750 MG 24 hr tablet TAKE 1 TABLET TWICE DAILY  WITH MEALS 03/19/18  Yes McVey, Gelene Mink, PA-C  metoprolol  tartrate (LOPRESSOR) 25 MG tablet TAKE 1 TABLET TWICE A DAY 11/08/17  Yes Janith Lima, MD  [facility] VERIO test strip 1 each by Other route 3 (three) times daily. USE TO TEST BLOOD SUGAR UP TO QID 03/06/18  Yes Forrest Moron, MD    Past Medical, Surgical Family and Social History reviewed and updated.    Objective:   Today's Vitals   03/30/18 1013 03/30/18 1022  BP: (!) 153/73 138/70  Pulse: 82   Temp: 97.9  F (36.6 C)   TempSrc: Oral   SpO2: 99%   Weight: 173 lb 9.6 oz (78.7 kg)   Height: 5\' 11"  (1.803 m)     Wt Readings from Last 3 Encounters:  03/30/18 173 lb 9.6 oz (78.7 kg)  12/17/17 173 lb 12.8 oz (78.8 kg)  11/29/17 174 lb 3.2 oz (79 kg)     Physical Exam General appearance: alert, well developed, well nourished, cooperative and in no distress Head: Normocephalic, without obvious abnormality, atraumatic Respiratory: Respirations even and unlabored, normal respiratory rate Heart: rate and rhythm normal. No gallop or murmurs noted on exam  Extremities: No gross deformities Skin: Skin color, texture, turgor normal. No rashes seen  Psych: Appropriate mood and affect. Neurologic: Mental status: Alert, oriented to person, place, and time, thought content appropriate.  Lab Results  Component Value Date   POCGLU 144 (A) 10/12/2017    Lab Results  Component Value Date   HGBA1C 6.0 (A) 12/17/2017       Assessment & Plan:  1. Hyperlipidemia with target LDL less than 100 - atorvastatin (LIPITOR) 10 MG tablet; TAKE 1 TABLET(10 MG) BY MOUTH DAILY  Dispense: 90 tablet; Refill: 1  2. Essential hypertension, well controlled today We have discussed target BP range and blood pressure goal. I have advised patient to check BP regularly and to call us back or report to clinic if the numbers are consistently higher than 140/90. We discussed the importance of compliance with medical therapy and DASH diet recommended, consequences of uncontrolled hypertension discussed.  -  Continue current BP medications Checking:  - Comprehensive metabolic panel  3. Type 2 diabetes mellitus with other specified complication, unspecified whether long term insulin use ([place]), and concern of recent elevated blood sugars will check an A1c today. Patient has a bag of medications available with her at today's visit.  Advised to resume the metformin 500 mg immediate release prescribed 2 times daily. These pills can be split in half to improve with digestion of tablets.  Discontinue taking any other previously prescribed metformin. Added glipizide 2.5 mg once daily and be sure to take with food to avoid hypoglycemia. - Hemoglobin A1c - CBC with Differential  4. Thyroid disorder screen - Thyroid Panel With TSH  5. Loss of weight, deferred any additional work-up advised to discuss with Dr. Nolon Rod at her new patient appointment. - CBC with Differential - Thyroid Panel With TSH - Vitamin B12  6. Vitamin D deficiency - VITAMIN D 25 Hydroxy (Vit-D Deficiency, Fractures)   Patient had multiple complaints today.  I did explain to her that today was same-day appointments and therefore any additional concerns will need to be addressed once she establishes with Dr. Nolon Rod.  All medications requested have been refilled.  -The patient was given clear instructions to go to ER or return to medical center if symptoms do not improve, worsen or new problems develop. The patient verbalized understanding.   Meds ordered this encounter  Medications  . metFORMIN (GLUCOPHAGE) 500 MG tablet    Sig: Take 1 tablet (500 mg total) by mouth 2 (two) times daily with a meal.    Dispense:  60 tablet    Refill:  3  . glipiZIDE (GLUCOTROL) 5 MG tablet    Sig: Take 0.5 tablets (2.5 mg total) by mouth daily with lunch.    Dispense:  90 tablet    Refill:  3  . DISCONTD: hydrochlorothiazide (HYDRODIURIL) 25 MG tablet    Sig: Take 1 tablet (25 mg  total) by mouth daily.    Dispense:  90 tablet     Refill:  1  . atorvastatin (LIPITOR) 10 MG tablet    Sig: TAKE 1 TABLET(10 MG) BY MOUTH DAILY    Dispense:  90 tablet    Refill:  1    A total of 40 minutes spent, greater than 50 % of this time was spent counseling and coordination of care.     Carroll Sage. Kenton Kingfisher, FNP-C Nurse Practitioner (PRN Staff)  Primary Care at [facility] [address] [place], [place]

## 2018-03-30 NOTE — Patient Instructions (Addendum)
     If you have lab work done today you will be contacted with your lab results within the next 2 weeks.  If you have not heard from [place] then please contact us. The fastest way to get your results is to register for My Chart.   IF you received an x-ray today, you will receive an invoice from [facility]. Please contact [facility] at [phone] with questions or concerns regarding your invoice.   IF you received labwork today, you will receive an invoice from [facility]. Please contact [facility] at [phone] with questions or concerns regarding your invoice.   Our billing staff will not be able to assist you with questions regarding bills from these companies.  You will be contacted with the lab results as soon as they are available. The fastest way to get your results is to activate your My Chart account. Instructions are located on the last page of this paperwork. If you have not heard from [place] regarding the results in 2 weeks, please contact this office.     You are to follow-up with Dr. Nolon Rod on January 10.  I have gone ahead and collected your routine screening labs during today's appointment.  You will be notified of results and if any additional changes in therapy are indicated prior to your visit on January 10.  Changes made today include I have changed her metformin to 500 mg twice daily of the non-extended release.  Please take medication as prescribed and evaluate blood sugars as recommended at minimum twice a day.  Due to recent elevations in blood sugars I am adding glipizide 2.5 mg and given the lunch is her your meal I recommend medication be taken daily with lunch to prevent hypoglycemia.  Hydrochlorothiazide along with atorvastatin have been refilled today.   Please bring meter to next visit so that Dr. Nolon Rod may be able to take a review glucose readings since new medication has been added.

## 2018-04-01 ENCOUNTER — Other Ambulatory Visit: Payer: Self-pay | Admitting: *Deleted

## 2018-04-01 ENCOUNTER — Telehealth: Payer: Self-pay | Admitting: Family Medicine

## 2018-04-01 DIAGNOSIS — E785 Hyperlipidemia, unspecified: Secondary | ICD-10-CM

## 2018-04-01 LAB — CBC WITH DIFFERENTIAL/PLATELET
Basophils Absolute: 0 10*3/uL (ref 0.0–0.2)
Basos: 0 %
EOS (ABSOLUTE): 0.1 10*3/uL (ref 0.0–0.4)
Eos: 2 %
Hematocrit: 34 % (ref 34.0–46.6)
Hemoglobin: 10.4 g/dL — ABNORMAL LOW (ref 11.1–15.9)
Immature Grans (Abs): 0 10*3/uL (ref 0.0–0.1)
Immature Granulocytes: 0 %
Lymphocytes Absolute: 1.6 10*3/uL (ref 0.7–3.1)
Lymphs: 33 %
MCH: 25.9 pg — ABNORMAL LOW (ref 26.6–33.0)
MCHC: 30.6 g/dL — ABNORMAL LOW (ref 31.5–35.7)
MCV: 85 fL (ref 79–97)
Monocytes Absolute: 0.4 10*3/uL (ref 0.1–0.9)
Monocytes: 8 %
Neutrophils Absolute: 2.8 10*3/uL (ref 1.4–7.0)
Neutrophils: 57 %
Platelets: 307 10*3/uL (ref 150–450)
RBC: 4.01 x10E6/uL (ref 3.77–5.28)
RDW: 12.1 % — ABNORMAL LOW (ref 12.3–15.4)
WBC: 4.9 10*3/uL (ref 3.4–10.8)

## 2018-04-01 LAB — THYROID PANEL WITH TSH
Free Thyroxine Index: 1.7 (ref 1.2–4.9)
T3 Uptake Ratio: 27 % (ref 24–39)
T4, Total: 6.4 ug/dL (ref 4.5–12.0)
TSH: 2.42 u[IU]/mL (ref 0.450–4.500)

## 2018-04-01 LAB — HEMOGLOBIN A1C
Est. average glucose Bld gHb Est-mCnc: 126 mg/dL
Hgb A1c MFr Bld: 6 % — ABNORMAL HIGH (ref 4.8–5.6)

## 2018-04-01 LAB — COMPREHENSIVE METABOLIC PANEL
ALT: 12 IU/L (ref 0–32)
AST: 12 IU/L (ref 0–40)
Albumin/Globulin Ratio: 1.6 (ref 1.2–2.2)
Albumin: 3.9 g/dL (ref 3.5–4.8)
Alkaline Phosphatase: 75 IU/L (ref 39–117)
BUN/Creatinine Ratio: 18 (ref 12–28)
BUN: 13 mg/dL (ref 8–27)
Bilirubin Total: 0.5 mg/dL (ref 0.0–1.2)
CO2: 23 mmol/L (ref 20–29)
Calcium: 9.6 mg/dL (ref 8.7–10.3)
Chloride: 103 mmol/L (ref 96–106)
Creatinine, Ser: 0.74 mg/dL (ref 0.57–1.00)
GFR calc Af Amer: 89 mL/min/{1.73_m2} (ref >59)
GFR calc non Af Amer: 77 mL/min/{1.73_m2} (ref >59)
Globulin, Total: 2.5 g/dL (ref 1.5–4.5)
Glucose: 158 mg/dL — ABNORMAL HIGH (ref 65–99)
Potassium: 4 mmol/L (ref 3.5–5.2)
Sodium: 142 mmol/L (ref 134–144)
Total Protein: 6.4 g/dL (ref 6.0–8.5)

## 2018-04-01 LAB — VITAMIN D 25 HYDROXY (VIT D DEFICIENCY, FRACTURES): Vit D, 25-Hydroxy: 33.2 ng/mL (ref 30.0–100.0)

## 2018-04-01 LAB — VITAMIN B12: Vitamin B-12: 615 pg/mL (ref 232–1245)

## 2018-04-01 MED ORDER — HYDROCHLOROTHIAZIDE 25 MG PO TABS: 25.0000 mg | ORAL_TABLET | Freq: Every day | ORAL | 0 refills | Status: DC

## 2018-04-01 NOTE — Telephone Encounter (Signed)
Please add a lipid panel to labs collected 03/30/2018

## 2018-04-02 LAB — SPECIMEN STATUS REPORT

## 2018-04-02 LAB — LIPID PANEL
Chol/HDL Ratio: 2.8 ratio (ref 0.0–4.4)
Cholesterol, Total: 137 mg/dL (ref 100–199)
HDL: 49 mg/dL (ref >39)
LDL Calculated: 72 mg/dL (ref 0–99)
Triglycerides: 80 mg/dL (ref 0–149)
VLDL Cholesterol Cal: 16 mg/dL (ref 5–40)

## 2018-04-18 NOTE — Telephone Encounter (Signed)
Lipid panel ordered on 03/30/18 and  Resulted on 04/02/18.

## 2018-04-23 ENCOUNTER — Other Ambulatory Visit: Payer: Self-pay | Admitting: Family Medicine

## 2018-04-23 MED ORDER — ONETOUCH VERIO VI STRP: 1.0000 | ORAL_STRIP | Freq: Three times a day (TID) | 0 refills | Status: DC

## 2018-04-24 DIAGNOSIS — C189 Malignant neoplasm of colon, unspecified: Secondary | ICD-10-CM

## 2018-04-24 HISTORY — DX: Malignant neoplasm of colon, unspecified: C18.9

## 2018-04-29 ENCOUNTER — Other Ambulatory Visit: Payer: Self-pay | Admitting: Family Medicine

## 2018-04-29 NOTE — Telephone Encounter (Signed)
Copied from [place] [id number]. Topic: Quick Communication - Rx Refill/Question >> Apr 29, 2018  4:25 PM Windy Kalata wrote: Medication: Roma Schanz test strip  Has the patient contacted their pharmacy? Yes.   (Agent: If no, request that the patient contact the pharmacy for the refill.) (Agent: If yes, when and what did the pharmacy advise?) Kim at [facility] is calling to get directions on this medication to refill, please advise. Best call back is [phone]   [phone]  Preferred Pharmacy (with phone number or street name):   Agent: Please be advised that RX refills may take up to 3 business days. We ask that you follow-up with your pharmacy.

## 2018-04-29 NOTE — Telephone Encounter (Signed)
Spoke with pharmacist about test strips and clarified the sig for test strips. Pharmacy verbalized understanding.

## 2018-04-30 MED ORDER — ONETOUCH VERIO VI STRP: 1.0000 | ORAL_STRIP | Freq: Three times a day (TID) | 0 refills | Status: DC

## 2018-04-30 NOTE — Telephone Encounter (Signed)
Requested Prescriptions  Pending Prescriptions Disp Refills  . ONETOUCH VERIO test strip 100 each 0    Sig: 1 each by Other route 3 (three) times daily. USE TO TEST BLOOD SUGAR UP TO QID     Endocrinology: Diabetes - Testing Supplies Passed - 04/30/2018  7:22 AM      Passed - Valid encounter within last 12 months    Recent Outpatient Visits          1 month ago Essential hypertension   [facility] at [place], FNP   4 months ago Type 2 diabetes mellitus with complication, without long-term current use of insulin [facility])   Primary Care at [facility], Gelene Mink, PA-C   5 months ago URI with cough and congestion   Primary Care at [place], PA-C   5 months ago Type 2 diabetes mellitus without complication, without long-term current use of insulin [facility])   Primary Care at [place], PA-C   6 months ago Type 2 diabetes mellitus without complication, without long-term current use of insulin [facility])   Primary Care at [place], PA-C      Future Appointments            In 3 days Forrest Moron, MD Primary Care at [place], [facility]

## 2018-04-30 NOTE — Addendum Note (Signed)
Addended by: Linus Orn A on: 04/30/2018 07:22 AM   Modules accepted: Orders

## 2018-05-03 ENCOUNTER — Ambulatory Visit (INDEPENDENT_AMBULATORY_CARE_PROVIDER_SITE_OTHER): Payer: Medicare HMO | Admitting: Family Medicine

## 2018-05-03 ENCOUNTER — Encounter: Payer: Self-pay | Admitting: Family Medicine

## 2018-05-03 VITALS — BP 149/71 | HR 68 | Temp 97.9°F | Resp 17 | Ht 71.0 in | Wt 169.0 lb

## 2018-05-03 DIAGNOSIS — E118 Type 2 diabetes mellitus with unspecified complications: Secondary | ICD-10-CM

## 2018-05-03 DIAGNOSIS — E1169 Type 2 diabetes mellitus with other specified complication: Secondary | ICD-10-CM

## 2018-05-03 DIAGNOSIS — E162 Hypoglycemia, unspecified: Secondary | ICD-10-CM

## 2018-05-03 DIAGNOSIS — R634 Abnormal weight loss: Secondary | ICD-10-CM | POA: Diagnosis not present

## 2018-05-03 LAB — GLUCOSE, POCT (MANUAL RESULT ENTRY): POC Glucose: 152 mg/dl — AB (ref 70–99)

## 2018-05-03 LAB — POCT GLYCOSYLATED HEMOGLOBIN (HGB A1C): Hemoglobin A1C: 5.7 % — AB (ref 4.0–5.6)

## 2018-05-03 MED ORDER — GLIPIZIDE 5 MG PO TABS: 2.5000 mg | ORAL_TABLET | ORAL | 3 refills | Status: DC

## 2018-05-03 NOTE — Progress Notes (Signed)
Established Patient Office Visit  Subjective:  Patient ID: Tonya [name], female    DOB: May 26, 1940  Age: 78 y.o. MRN: [id number]  CC:  Chief Complaint  Patient presents with  . Establish Care    HPI Tonya [name] presents for   Diabetes Mellitus: Patient presents for follow up of diabetes.  She feels nausea and has stomach upset She was seeing Dr. Ronnald Ramp who put her on Tresiba but the patient did not want to take that medication because it was an injectable medication She was not sure what to eat so with the increasing nausea she just went on a dramatic diet changed and stopped  She was not started on any medications at the time of her diagnosed of diabetes in March 2019 She states that she was feeling dizzy and losing weight and did not know how to manage it. Her numbers increased  She was started on Tresiba and Farxiga then went on to develop intolerance to that so was changed to [place] and Metformin She could not tolerate the Xigduo and had abdominal pains so she was changed to Metformin and added glipizide She eats a small amount of food and is very nauseous She is very strict on her carbohydrates  Component     Latest Ref Rng & Units 07/17/2017 10/10/2017 10/11/2017  Hemoglobin A1C     4.8 - 5.6 % 7.3 (H) 9.8 (H) 10.0 (A)  Est. average glucose Bld gHb Est-mCnc     mg/dL      Component     Latest Ref Rng & Units 12/17/2017 03/30/2018  Hemoglobin A1C     4.8 - 5.6 % 6.0 (A) 6.0 (H)  Est. average glucose Bld gHb Est-mCnc     mg/dL  126    Weight loss States that she is not eating bread or much starches She does not eat much and only a small piece of chicken and vegetables Since starting metformin feels like she has sto go to the bathroom and makes it very upset She feel like she always wants to throw up Lab Results  Component Value Date   HGBA1C 6.0 (H) 03/30/2018    Wt Readings from Last 3 Encounters:  05/03/18 169 lb (76.7 kg)  03/30/18 173 lb 9.6 oz (78.7 kg)    12/17/17 173 lb 12.8 oz (78.8 kg)     Past Medical History:  Diagnosis Date  . Cerebrovascular accident ([place])   . Chronic edema    ? venous insufficiency  . COPD (chronic obstructive pulmonary disease) ([place])    never had a problem breathing  . Enlarged heart    per pt  . Gallstones   . GERD (gastroesophageal reflux disease)   . Hyperlipidemia   . Hypertension     Past Surgical History:  Procedure Laterality Date  . ABDOMINAL HYSTERECTOMY    . CHOLECYSTECTOMY      Family History  Problem Relation Age of Onset  . Cirrhosis Mother   . Heart disease Father   . Cancer Other   . Hypertension Other   . Diabetes Other     Social History   Socioeconomic History  . Marital status: Widowed    Spouse name: Not on file  . Number of children: Not on file  . Years of education: Not on file  . Highest education level: Not on file  Occupational History  . Not on file  Social Needs  . Financial resource strain: Not on file  . Food insecurity:  Worry: Not on file    Inability: Not on file  . Transportation needs:    Medical: Not on file    Non-medical: Not on file  Tobacco Use  . Smoking status: Never Smoker  . Smokeless tobacco: Never Used  Substance and Sexual Activity  . Alcohol use: No    Alcohol/week: 0.0 standard drinks  . Drug use: No  . Sexual activity: Not Currently  Lifestyle  . Physical activity:    Days per week: Not on file    Minutes per session: Not on file  . Stress: Not on file  Relationships  . Social connections:    Talks on phone: Not on file    Gets together: Not on file    Attends religious service: Not on file    Active member of club or organization: Not on file    Attends meetings of clubs or organizations: Not on file    Relationship status: Not on file  . Intimate partner violence:    Fear of current or ex partner: Not on file    Emotionally abused: Not on file    Physically abused: Not on file    Forced sexual activity: Not on  file  Other Topics Concern  . Not on file  Social History Narrative  . Not on file    Outpatient Medications Prior to Visit  Medication Sig Dispense Refill  . aspirin 81 MG tablet Take 81 mg by mouth daily.      Marland Kitchen atorvastatin (LIPITOR) 10 MG tablet TAKE 1 TABLET(10 MG) BY MOUTH DAILY 90 tablet 1  . Cholecalciferol 2000 units TABS Take 1 tablet (2,000 Units total) by mouth daily. 90 tablet 1  . cholestyramine (QUESTRAN) 4 G packet DISSOLVE 1 PACKET INTO 2 TO 6 OUNCES OF WATER AND DRINK 2 TIMES A DAY. 60 packet 11  . cyanocobalamin 2000 MCG tablet Take 1 tablet (2,000 mcg total) by mouth daily. 90 tablet 3  . diltiazem (CARDIZEM CD) 180 MG 24 hr capsule TAKE 1 CAPSULE DAILY 90 capsule 1  . ferrous sulfate 325 (65 FE) MG tablet Take 1 tablet (325 mg total) by mouth 2 (two) times daily with a meal. 180 tablet 1  . glipiZIDE (GLUCOTROL) 5 MG tablet Take 0.5 tablets (2.5 mg total) by mouth daily with lunch. 90 tablet 3  . hydrochlorothiazide (HYDRODIURIL) 25 MG tablet Take 1 tablet (25 mg total) by mouth daily. 90 tablet 0  . Lancets (ONETOUCH ULTRASOFT) lancets Prick finger 2 times a day to test blood glucose level 100 each 12  . metFORMIN (GLUCOPHAGE) 500 MG tablet Take 1 tablet (500 mg total) by mouth 2 (two) times daily with a meal. For 7 days, then change to 2 tabs(1000mg ) 2 (two) times daily 120 tablet 0  . metoprolol tartrate (LOPRESSOR) 25 MG tablet TAKE 1 TABLET TWICE A DAY 180 tablet 1  . ONETOUCH VERIO test strip 1 each by Other route 3 (three) times daily. USE TO TEST BLOOD SUGAR UP TO QID 100 each 0  . Calcium Carbonate-Vitamin D (CALCIUM 600 + D PO) Take by mouth daily.       No facility-administered medications prior to visit.     Allergies  Allergen Reactions  . Hydrocodone   . Iohexol      Code: HIVES, Desc: PT developed hive and fullness in throat post injection of 125cc's Omni 300, Onset Date: 23536144   . Naproxen   . Propoxyphene N-Acetaminophen     ROS Review of  Systems Review of Systems  Constitutional: Negative for activity change, appetite change, chills and fever.  HENT: Negative for congestion, nosebleeds, trouble swallowing and voice change.   Respiratory: Negative for cough, shortness of breath and wheezing.   Gastrointestinal: see hpi Genitourinary: Negative for difficulty urinating, dysuria, flank pain and hematuria.  Musculoskeletal: Negative for back pain, joint swelling and neck pain.  Neurological: Negative for dizziness, speech difficulty, light-headedness and numbness.  See HPI. All other review of systems negative.     Objective:    Physical Exam  BP (!) 149/71   Pulse 68   Temp 97.9 F (36.6 C) (Oral)   Resp 17   Ht 5\' 11"  (1.803 m)   Wt 169 lb (76.7 kg)   SpO2 98%   BMI 23.57 kg/m  Wt Readings from Last 3 Encounters:  05/03/18 169 lb (76.7 kg)  03/30/18 173 lb 9.6 oz (78.7 kg)  12/17/17 173 lb 12.8 oz (78.8 kg)   Physical Exam  Constitutional: Oriented to person, place, and time. Appears well-developed and well-nourished.  HENT:  Head: Normocephalic and atraumatic.  Eyes: Conjunctivae and EOM are normal.  Cardiovascular: Normal rate, regular rhythm, normal heart sounds and intact distal pulses.  No murmur heard. Pulmonary/Chest: Effort normal and breath sounds normal. No stridor. No respiratory distress. Has no wheezes.  Neurological: Is alert and oriented to person, place, and time.  Skin: Skin is warm. Capillary refill takes less than 2 seconds.  Psychiatric: Has a normal mood and affect. Behavior is normal. Judgment and thought content normal.    Health Maintenance Due  Topic Date Due  . OPHTHALMOLOGY EXAM  05/13/1950  . INFLUENZA VACCINE  11/22/2017    There are no preventive care reminders to display for this patient.  Lab Results  Component Value Date   TSH 2.420 03/30/2018   Lab Results  Component Value Date   WBC 4.9 03/30/2018   HGB 10.4 (L) 03/30/2018   HCT 34.0 03/30/2018   MCV 85  03/30/2018   PLT 307 03/30/2018   Lab Results  Component Value Date   NA 142 03/30/2018   K 4.0 03/30/2018   CO2 23 03/30/2018   GLUCOSE 158 (H) 03/30/2018   BUN 13 03/30/2018   CREATININE 0.74 03/30/2018   BILITOT 0.5 03/30/2018   ALKPHOS 75 03/30/2018   AST 12 03/30/2018   ALT 12 03/30/2018   PROT 6.4 03/30/2018   ALBUMIN 3.9 03/30/2018   CALCIUM 9.6 03/30/2018   GFR 94.31 10/10/2017   Lab Results  Component Value Date   CHOL 137 03/30/2018   Lab Results  Component Value Date   HDL 49 03/30/2018   Lab Results  Component Value Date   LDLCALC 72 03/30/2018   Lab Results  Component Value Date   TRIG 80 03/30/2018   Lab Results  Component Value Date   CHOLHDL 2.8 03/30/2018   Lab Results  Component Value Date   HGBA1C 6.0 (H) 03/30/2018      Assessment & Plan:   Problem List Items Addressed This Visit      Endocrine   Type 2 diabetes mellitus with complication, without long-term current use of insulin ([place]) - Primary -  Will stop farxiga -  Discussed that since she is over 20 and eats sparingly she does not need as much medication   Relevant Orders   POCT glucose (manual entry)   Hemoglobin A1c   Ambulatory referral to diabetic education    Other Visit Diagnoses    Hypoglycemia    -  Advised pt that since she was only recently diagnosed that the goal is to be diet controlled Pt is very anxious about the diabetes   Relevant Orders   Hemoglobin A1c   Ambulatory referral to diabetic education   Loss of weight    -  Pt has radically limited her carb intake Discussed that she needs to eat in a more balanced way   Relevant Orders   Hemoglobin A1c   Ambulatory referral to diabetic education       No orders of the defined types were placed in this encounter.   Follow-up: No follow-ups on file.    Forrest Moron, MD

## 2018-05-03 NOTE — Patient Instructions (Addendum)
Decreasing the Glipizide to every other day  Write down all the foods and drinks that you consume  Diabetes.org  Follow up in six week for hemoglobin A1c and you sugar readings  Check your sugars every other day and write down your readings  If your sugars are low overnight have a spoon of peanut butter (low sugars are usually less than 80)  Fasting ranges should be between 90-140  After a meals 1-2 hours after your readings should be 140 -180    If you have lab work done today you will be contacted with your lab results within the next 2 weeks.  If you have not heard from [place] then please contact us. The fastest way to get your results is to register for My Chart.   IF you received an x-ray today, you will receive an invoice from [facility]. Please contact [facility] at [phone] with questions or concerns regarding your invoice.   IF you received labwork today, you will receive an invoice from [place]. Please contact [facility] at [phone] with questions or concerns regarding your invoice.   Our billing staff will not be able to assist you with questions regarding bills from these companies.  You will be contacted with the lab results as soon as they are available. The fastest way to get your results is to activate your My Chart account. Instructions are located on the last page of this paperwork. If you have not heard from [place] regarding the results in 2 weeks, please contact this office.      Protein-Energy Malnutrition Protein-energy malnutrition is when a person does not eat enough protein, fat, and calories. When this happens over time, it can lead to severe loss of muscle tissue (muscle wasting). This condition also affects the body's defense system (immune system) and can lead to other health problems. What are the causes? This condition may be caused by:  Not eating enough protein, fat, or calories.  Having certain chronic medical  conditions.  Eating too little. What increases the risk? The following factors may make you more likely to develop this condition:  Living in poverty.  Long-term hospitalization.  Alcohol or drug dependency. Addiction often leads to a lifestyle in which proper diet is ignored. Dependency can also hurt the metabolism and the body's ability to absorb nutrients.  Eating disorders, such as anorexia nervosa or bulimia.  Chewing or swallowing problems. People with these disorders may not eat enough.  Having certain conditions, such as: ? Inflammatory bowel disease. Inflammation of the intestines makes it difficult for the body to absorb nutrients. ? Cancer or AIDS. These diseases can cause a loss of appetite. ? Chronic heart failure. This interferes with how the body uses nutrients. ? Cystic fibrosis. This disease can make it difficult for the body to absorb nutrients.  Eating a diet that extremely restricts protein, fat, or calorie intake. What are the signs or symptoms? Symptoms of this condition include:  Fatigue.  Weakness.  Dizziness.  Fainting.  Weight loss.  Loss of muscle tone and muscle mass.  Poor immune response.  Lack of menstruation.  Poor memory.  Hair loss.  Skin changes. How is this diagnosed? This condition may be diagnosed based on:  Your medical and dietary history.  A physical exam. This may include a measurement of your body mass index (BMI).  Blood tests. How is this treated? This condition may be managed with:  Nutrition therapy. This may include working with a diet and  nutrition specialist (dietitian).  Treatment for underlying conditions. People with severe protein-energy malnutrition may need to be treated in a hospital. This may involve receiving nutrition and fluids through an IV. Follow these instructions at home:   Eat a balanced diet. In each meal, include at least one food that is high in protein. Foods that are high in  protein include: ? Meat. ? Poultry. ? Fish. ? Eggs. ? Cheese. ? Milk. ? Beans. ? Nuts.  Eat nutrient-rich foods that are easy to swallow and digest, such as: ? Fruit and yogurt smoothies. ? Oatmeal with nut butter.  Try to eat six small meals each day instead of three large meals.  Take vitamin and protein supplements as told by your health care provider or dietitian.  Follow your health care provider's recommendations about exercise and activity.  Keep all follow-up visits as told by your health care provider. This is important. Contact a health care provider if you:  Have increased weakness or fatigue.  Faint.  Are a woman and you stop having your period (menstruating).  Have rapid hair loss.  Have unexpected weight loss.  Have diarrhea.  Have nausea and vomiting. Get help right away if you have:  Difficulty breathing.  Chest pain. Summary  Protein-energy malnutrition is when a person does not eat enough protein, fat, and calories.  Protein-energy malnutrition can lead to severe loss of muscle tissue (muscle wasting). This condition also affects the body's defense system (immune system) and can lead to other health problems.  Talk with your health care provider about treatment for this condition. Effective treatment depends on the underlying cause of the malnutrition. This information is not intended to replace advice given to you by your health care provider. Make sure you discuss any questions you have with your health care provider. Document Released: 02/24/2005 Document Revised: 04/25/2017 Document Reviewed: 04/25/2017 Elsevier Interactive Patient Education  2019 [employer].

## 2018-05-03 NOTE — Telephone Encounter (Addendum)
[facility] calling to clarify directions as sent to them.  ONETOUCH VERIO test strip Sig - Route: 1 each by Other route 3 (three) times daily. USE TO TEST BLOOD SUGAR UP TO QID - Other   Pharmacy states please call back and clarify.  CB  [id number] Ref no  [id number]  Has always been just QID They will put on hold until they hear back

## 2018-05-16 DIAGNOSIS — R69 Illness, unspecified: Secondary | ICD-10-CM | POA: Diagnosis not present

## 2018-05-17 ENCOUNTER — Encounter: Payer: Self-pay | Admitting: Family Medicine

## 2018-05-23 ENCOUNTER — Other Ambulatory Visit: Payer: Self-pay

## 2018-05-23 DIAGNOSIS — E118 Type 2 diabetes mellitus with unspecified complications: Secondary | ICD-10-CM

## 2018-05-24 ENCOUNTER — Other Ambulatory Visit: Payer: Self-pay | Admitting: *Deleted

## 2018-05-24 DIAGNOSIS — R69 Illness, unspecified: Secondary | ICD-10-CM | POA: Diagnosis not present

## 2018-05-24 MED ORDER — HYDROCHLOROTHIAZIDE 25 MG PO TABS: 25.0000 mg | ORAL_TABLET | Freq: Every day | ORAL | 0 refills | Status: DC

## 2018-05-24 MED ORDER — ONETOUCH ULTRASOFT LANCETS MISC: 12 refills | Status: DC

## 2018-05-24 MED ORDER — ONETOUCH VERIO VI STRP: 1.0000 | ORAL_STRIP | Freq: Three times a day (TID) | 0 refills | Status: DC

## 2018-05-30 ENCOUNTER — Telehealth: Payer: Self-pay | Admitting: Family Medicine

## 2018-05-30 NOTE — Telephone Encounter (Signed)
Copied from [place] [id number]. Topic: General - Other >> May 29, 2018  5:13 PM Yvette Rack wrote: Reason for CRM: Tiffany with [facility] called for clarification on the Rx for [facility] VERIO test strip. Per Tiffany, this is the third attempt so the Rx will be placed on hold until they receive a response to clarify the Rx instructions. Cb# [id number] Reference# [id number]

## 2018-05-31 ENCOUNTER — Encounter: Payer: Self-pay | Admitting: Family Medicine

## 2018-05-31 ENCOUNTER — Other Ambulatory Visit: Payer: Self-pay | Admitting: Internal Medicine

## 2018-05-31 ENCOUNTER — Other Ambulatory Visit: Payer: Self-pay

## 2018-05-31 ENCOUNTER — Ambulatory Visit (INDEPENDENT_AMBULATORY_CARE_PROVIDER_SITE_OTHER): Payer: Medicare HMO | Admitting: Family Medicine

## 2018-05-31 VITALS — BP 134/62 | HR 72 | Temp 98.4°F | Ht 71.0 in | Wt 173.0 lb

## 2018-05-31 DIAGNOSIS — I10 Essential (primary) hypertension: Secondary | ICD-10-CM

## 2018-05-31 DIAGNOSIS — I493 Ventricular premature depolarization: Secondary | ICD-10-CM | POA: Diagnosis not present

## 2018-05-31 DIAGNOSIS — I69359 Hemiplegia and hemiparesis following cerebral infarction affecting unspecified side: Secondary | ICD-10-CM | POA: Diagnosis not present

## 2018-05-31 DIAGNOSIS — H814 Vertigo of central origin: Secondary | ICD-10-CM | POA: Diagnosis not present

## 2018-05-31 DIAGNOSIS — R42 Dizziness and giddiness: Secondary | ICD-10-CM

## 2018-05-31 DIAGNOSIS — E118 Type 2 diabetes mellitus with unspecified complications: Secondary | ICD-10-CM | POA: Diagnosis not present

## 2018-05-31 LAB — POCT URINALYSIS DIP (MANUAL ENTRY)
Bilirubin, UA: NEGATIVE — AB
Blood, UA: NEGATIVE — AB
Glucose, UA: NEGATIVE mg/dL
Ketones, POC UA: NEGATIVE mg/dL — AB
Leukocytes, UA: NEGATIVE
Nitrite, UA: NEGATIVE
Spec Grav, UA: 1.02 — AB (ref 1.010–1.025)
Urobilinogen, UA: 1 E.U./dL
pH, UA: 7 (ref 5.0–8.0)

## 2018-05-31 LAB — GLUCOSE, POCT (MANUAL RESULT ENTRY): POC GLUCOSE: 179 mg/dL — AB (ref 70–99)

## 2018-05-31 NOTE — Patient Instructions (Addendum)
If you have lab work done today you will be contacted with your lab results within the next 2 weeks.  If you have not heard from [place] then please contact us. The fastest way to get your results is to register for My Chart.   IF you received an x-ray today, you will receive an invoice from [facility]. Please contact [facility] at [phone] with questions or concerns regarding your invoice.   IF you received labwork today, you will receive an invoice from [place]. Please contact [facility] at [phone] with questions or concerns regarding your invoice.   Our billing staff will not be able to assist you with questions regarding bills from these companies.  You will be contacted with the lab results as soon as they are available. The fastest way to get your results is to activate your My Chart account. Instructions are located on the last page of this paperwork. If you have not heard from [place] regarding the results in 2 weeks, please contact this office.     Dizziness Dizziness is a common problem. It is a feeling of unsteadiness or light-headedness. You may feel like you are about to faint. Dizziness can lead to injury if you stumble or fall. Anyone can become dizzy, but dizziness is more common in older adults. This condition can be caused by a number of things, including medicines, dehydration, or illness. Follow these instructions at home: Eating and drinking  Drink enough fluid to keep your urine clear or pale yellow. This helps to keep you from becoming dehydrated. Try to drink more clear fluids, such as water.  Do not drink alcohol.  Limit your caffeine intake if told to do so by your health care provider. Check ingredients and nutrition facts to see if a food or beverage contains caffeine.  Limit your salt (sodium) intake if told to do so by your health care provider. Check ingredients and nutrition facts to see if a food or beverage contains  sodium. Activity  Avoid making quick movements. ? Rise slowly from chairs and steady yourself until you feel okay. ? In the morning, first sit up on the side of the bed. When you feel okay, stand slowly while you hold onto something until you know that your balance is fine.  If you need to stand in one place for a long time, move your legs often. Tighten and relax the muscles in your legs while you are standing.  Do not drive or use heavy machinery if you feel dizzy.  Avoid bending down if you feel dizzy. Place items in your home so that they are easy for you to reach without leaning over. Lifestyle  Do not use any products that contain nicotine or tobacco, such as cigarettes and e-cigarettes. If you need help quitting, ask your health care provider.  Try to reduce your stress level by using methods such as yoga or meditation. Talk with your health care provider if you need help to manage your stress. General instructions  Watch your dizziness for any changes.  Take over-the-counter and prescription medicines only as told by your health care provider. Talk with your health care provider if you think that your dizziness is caused by a medicine that you are taking.  Tell a friend or a family member that you are feeling dizzy. If he or she notices any changes in your behavior, have this person call your health care provider.  Keep all follow-up visits as told by your  health care provider. This is important. Contact a health care provider if:  Your dizziness does not go away.  Your dizziness or light-headedness gets worse.  You feel nauseous.  You have reduced hearing.  You have new symptoms.  You are unsteady on your feet or you feel like the room is spinning. Get help right away if:  You vomit or have diarrhea and are unable to eat or drink anything.  You have problems talking, walking, swallowing, or using your arms, hands, or legs.  You feel generally weak.  You are not  thinking clearly or you have trouble forming sentences. It may take a friend or family member to notice this.  You have chest pain, abdominal pain, shortness of breath, or sweating.  Your vision changes.  You have any bleeding.  You have a severe headache.  You have neck pain or a stiff neck.  You have a fever. These symptoms may represent a serious problem that is an emergency. Do not wait to see if the symptoms will go away. Get medical help right away. Call your local emergency services (911 in the [place] Do not drive yourself to the hospital. Summary  Dizziness is a feeling of unsteadiness or light-headedness. This condition can be caused by a number of things, including medicines, dehydration, or illness.  Anyone can become dizzy, but dizziness is more common in older adults.  Drink enough fluid to keep your urine clear or pale yellow. Do not drink alcohol.  Avoid making quick movements if you feel dizzy. Monitor your dizziness for any changes. This information is not intended to replace advice given to you by your health care provider. Make sure you discuss any questions you have with your health care provider. Document Released: 10/04/2000 Document Revised: 05/13/2016 Document Reviewed: 05/13/2016 Elsevier Interactive Patient Education  2019 [employer].

## 2018-05-31 NOTE — Progress Notes (Signed)
Established Patient Office Visit  Subjective:  Patient ID: Tonya [name], female    DOB: [birth year]  Age: 78 y.o. MRN: [id number]  CC:  Chief Complaint  Patient presents with  . Dizziness    worse the last couple of months   . Shortness of Breath    last couple of months worse   . request medication    B12    HPI Tonya [name] presents for    Patient reports that her sugars have been increasing since stopping the metformin She is taking blood glucose readings of 150-180s fasting with postprandial 180s She eats dinner at around 6pm then goes to bed at 7pm She wakes up every 2 hours to urinate She starts the day between 6 and 7am She eat breakfast and 9:30am everyday with a bowl of oatmeal, protein shake and an halo She has been doing a January fast. She did not eat meat, sweets, bread  Lunch at 12:30  Greens and baked sweet potato Dinner at 6:00pm greens and baked sweet potato  She is having increased fasting glucose She has a handful of grapes  She takes B12 energy shot 10, 000% -  B3 16mg , B6 34mg , B12 262mcg, sodium 10mg , caffeine 2.125g of caffeine, taurine She states that she drank this once a week for fatigue.   Lab Results  Component Value Date   HGBA1C 5.7 (A) 05/03/2018      Past Medical History:  Diagnosis Date  . Cerebrovascular accident ([place])   . Chronic edema    ? venous insufficiency  . COPD (chronic obstructive pulmonary disease) ([place])    never had a problem breathing  . Enlarged heart    per pt  . Gallstones   . GERD (gastroesophageal reflux disease)   . Hyperlipidemia   . Hypertension     Past Surgical History:  Procedure Laterality Date  . ABDOMINAL HYSTERECTOMY    . CHOLECYSTECTOMY      Family History  Problem Relation Age of Onset  . Cirrhosis Mother   . Heart disease Father   . Cancer Other   . Hypertension Other   . Diabetes Other     Social History   Socioeconomic History  . Marital status: Widowed    Spouse name:  Not on file  . Number of children: Not on file  . Years of education: Not on file  . Highest education level: Not on file  Occupational History  . Not on file  Social Needs  . Financial resource strain: Not on file  . Food insecurity:    Worry: Not on file    Inability: Not on file  . Transportation needs:    Medical: Not on file    Non-medical: Not on file  Tobacco Use  . Smoking status: Never Smoker  . Smokeless tobacco: Never Used  Substance and Sexual Activity  . Alcohol use: No    Alcohol/week: 0.0 standard drinks  . Drug use: No  . Sexual activity: Not Currently  Lifestyle  . Physical activity:    Days per week: Not on file    Minutes per session: Not on file  . Stress: Not on file  Relationships  . Social connections:    Talks on phone: Not on file    Gets together: Not on file    Attends religious service: Not on file    Active member of club or organization: Not on file    Attends meetings of clubs or organizations: Not  on file    Relationship status: Not on file  . Intimate partner violence:    Fear of current or ex partner: Not on file    Emotionally abused: Not on file    Physically abused: Not on file    Forced sexual activity: Not on file  Other Topics Concern  . Not on file  Social History Narrative  . Not on file    Outpatient Medications Prior to Visit  Medication Sig Dispense Refill  . aspirin 81 MG tablet Take 81 mg by mouth daily.      Marland Kitchen atorvastatin (LIPITOR) 10 MG tablet TAKE 1 TABLET(10 MG) BY MOUTH DAILY 90 tablet 1  . Calcium Carbonate-Vitamin D (CALCIUM 600 + D PO) Take by mouth daily.      . Cholecalciferol 2000 units TABS Take 1 tablet (2,000 Units total) by mouth daily. 90 tablet 1  . cholestyramine (QUESTRAN) 4 G packet DISSOLVE 1 PACKET INTO 2 TO 6 OUNCES OF WATER AND DRINK 2 TIMES A DAY. 60 packet 11  . diltiazem (CARDIZEM CD) 180 MG 24 hr capsule TAKE 1 CAPSULE DAILY 90 capsule 1  . ferrous sulfate 325 (65 FE) MG tablet Take 1  tablet (325 mg total) by mouth 2 (two) times daily with a meal. 180 tablet 1  . glipiZIDE (GLUCOTROL) 5 MG tablet Take 0.5 tablets (2.5 mg total) by mouth every other day. 90 tablet 3  . hydrochlorothiazide (HYDRODIURIL) 25 MG tablet Take 1 tablet (25 mg total) by mouth daily. 90 tablet 0  . Lancets (ONETOUCH ULTRASOFT) lancets Prick finger 2 times a day to test blood glucose level 100 each 12  . metoprolol tartrate (LOPRESSOR) 25 MG tablet TAKE 1 TABLET TWICE A DAY 180 tablet 1  . ONETOUCH VERIO test strip 1 each by Other route 3 (three) times daily. USE TO TEST BLOOD SUGAR UP TO QID 100 each 0  . cyanocobalamin 2000 MCG tablet Take 1 tablet (2,000 mcg total) by mouth daily. 90 tablet 3   No facility-administered medications prior to visit.     Allergies  Allergen Reactions  . Hydrocodone   . Iohexol      Code: HIVES, Desc: PT developed hive and fullness in throat post injection of 125cc's Omni 300, Onset Date: [id number]   . Naproxen   . Propoxyphene N-Acetaminophen     ROS Review of Systems Review of Systems  Constitutional: Negative for activity change, appetite change, chills and fever.  HENT: Negative for congestion, nosebleeds, trouble swallowing and voice change.   Respiratory: Negative for cough, shortness of breath and wheezing.   Gastrointestinal: Negative for diarrhea, nausea and vomiting.  Genitourinary: Negative for difficulty urinating, dysuria, flank pain and hematuria.  Musculoskeletal: Negative for back pain, joint swelling and neck pain.  Neurological: see hpi See HPI. All other review of systems negative.     Objective:    Physical Exam  BP 134/62 (BP Location: Right Arm, Patient Position: Sitting, Cuff Size: Normal)   Pulse 72   Temp 98.4 F (36.9 C) (Oral)   Ht 5\' 11"  (1.803 m)   Wt 173 lb (78.5 kg)   SpO2 100%   BMI 24.13 kg/m  Wt Readings from Last 3 Encounters:  05/31/18 173 lb (78.5 kg)  05/03/18 169 lb (76.7 kg)  03/30/18 173 lb 9.6 oz (78.7  kg)   Physical Exam  Constitutional: Oriented to person, place, and time. Appears well-developed and well-nourished.  HENT:  Head: Normocephalic and atraumatic.  Eyes: Conjunctivae and  EOM are normal.  Cardiovascular: Normal rate, regular rhythm, normal heart sounds and intact distal pulses.  No murmur heard. Pulmonary/Chest: Effort normal and breath sounds normal. No stridor. No respiratory distress. Has no wheezes.  Neurological: Is alert and oriented to person, place, and time.  Skin: Skin is warm. Capillary refill takes less than 2 seconds.  Psychiatric: Has a normal mood and affect. Behavior is normal. Judgment and thought content normal.   ECG - sinus bradycardia, old infarct  Health Maintenance Due  Topic Date Due  . OPHTHALMOLOGY EXAM  05/13/1950  . INFLUENZA VACCINE  11/22/2017    There are no preventive care reminders to display for this patient.  Lab Results  Component Value Date   TSH 2.420 03/30/2018   Lab Results  Component Value Date   WBC 4.9 03/30/2018   HGB 10.4 (L) 03/30/2018   HCT 34.0 03/30/2018   MCV 85 03/30/2018   PLT 307 03/30/2018   Lab Results  Component Value Date   NA 142 03/30/2018   K 4.0 03/30/2018   CO2 23 03/30/2018   GLUCOSE 158 (H) 03/30/2018   BUN 13 03/30/2018   CREATININE 0.74 03/30/2018   BILITOT 0.5 03/30/2018   ALKPHOS 75 03/30/2018   AST 12 03/30/2018   ALT 12 03/30/2018   PROT 6.4 03/30/2018   ALBUMIN 3.9 03/30/2018   CALCIUM 9.6 03/30/2018   GFR 94.31 10/10/2017   Lab Results  Component Value Date   CHOL 137 03/30/2018   Lab Results  Component Value Date   HDL 49 03/30/2018   Lab Results  Component Value Date   LDLCALC 72 03/30/2018   Lab Results  Component Value Date   TRIG 80 03/30/2018   Lab Results  Component Value Date   CHOLHDL 2.8 03/30/2018   Lab Results  Component Value Date   HGBA1C 5.7 (A) 05/03/2018      Assessment & Plan:   Problem List Items Addressed This Visit       Cardiovascular and Mediastinum   Frequent PVCs    Relevant Orders   Ambulatory referral to Cardiology     Endocrine   Type 2 diabetes mellitus with complication, without long-term current use of insulin ([place])  - would advised patient that she should increase her intake of her food She fasts for over 14   Relevant Orders   Ambulatory referral to Neurology   MR MRA HEAD WO CONTRAST   Ambulatory referral to Cardiology     Nervous and Auditory   CVA, old, hemiparesis ([place])  - will refer to Neurology   Relevant Orders   Ambulatory referral to Neurology   MR MRA HEAD WO CONTRAST    Other Visit Diagnoses    Dizziness and giddiness    -  Primary     Relevant Orders   Ambulatory referral to Neurology   MR MRA HEAD WO CONTRAST   POCT glucose (manual entry) (Completed)   POCT urinalysis dipstick (Completed)   EKG 12-Lead (Completed)   Ambulatory referral to Cardiology   Vertigo of central origin    -  Discussed that she should see Neurology but will refer to Cardiology as well because the ECG shows some concern for ischemia but even more concerning is that she has bradycardia down to 50s and is 70s upright   Relevant Orders   Ambulatory referral to Neurology   MR MRA HEAD WO CONTRAST   Ambulatory referral to Cardiology      No orders of the defined  types were placed in this encounter.   Follow-up: Return in about 4 weeks (around 06/28/2018) for dizziness and near syncope.    Forrest Moron, MD

## 2018-06-04 ENCOUNTER — Telehealth: Payer: Self-pay | Admitting: Family Medicine

## 2018-06-04 NOTE — Telephone Encounter (Signed)
Please adv     Copied from [place] [id number]. Topic: Referral - Request for Referral >> Jun 04, 2018 11:29 AM Reyne Dumas L wrote: Has patient seen PCP for this complaint? yes *If NO, is insurance requiring patient see PCP for this issue before PCP can refer them? Referral for which specialty: dietician/nutrition Preferred provider/office: no preference Reason for referral: learning what to eat as a diabetic  Pt can be reached at [phone]

## 2018-06-06 ENCOUNTER — Telehealth: Payer: Self-pay | Admitting: Family Medicine

## 2018-06-06 ENCOUNTER — Other Ambulatory Visit: Payer: Self-pay

## 2018-06-06 DIAGNOSIS — E118 Type 2 diabetes mellitus with unspecified complications: Secondary | ICD-10-CM

## 2018-06-06 NOTE — Telephone Encounter (Signed)
Talked with Tonya [name] with [facility] for clarification on onetouch vero test strip per chart

## 2018-06-06 NOTE — Telephone Encounter (Signed)
Copied from [place] [id number]. Topic: General - Other >> Jun 06, 2018  3:23 PM Leward Quan A wrote: Reason for CRM: Disha called from [facility] regarding referral for patient ask to be called back at number below.  [phone]  Case reference # [id number]

## 2018-06-06 NOTE — Telephone Encounter (Signed)
Referral has been sen.

## 2018-06-07 NOTE — Telephone Encounter (Signed)
Tonya [name], daughter called and would like to know if the dietician " [facility] " is  the same one she went to before because if she she does not want to go. Also, she said that every morning after she checks her sugar it is in the 200's. She said ever since she started the glipiZIDE (GLUCOTROL) 5 MG tablet. Please call [name] @ [phone]

## 2018-06-09 ENCOUNTER — Encounter: Payer: Self-pay | Admitting: Family Medicine

## 2018-06-09 NOTE — Telephone Encounter (Signed)
Hello referral team please check and see if the referral is to the same diabetes center. If it is then please cancel it. She did not have a good experience otherwise.

## 2018-06-10 ENCOUNTER — Encounter: Payer: Self-pay | Admitting: Family Medicine

## 2018-06-11 ENCOUNTER — Telehealth: Payer: Self-pay | Admitting: Family Medicine

## 2018-06-11 NOTE — Telephone Encounter (Signed)
Received message from [facility] patients MRA was denied not [place]  A peer to peer can be done [phone] opt 4 It has to be done today by 4 because case was open on the 10th and medicare has stipulations per Maudie Mercury   Please adv

## 2018-06-11 NOTE — Telephone Encounter (Signed)
It is the same one she was referred to in June , would you like for me to find her another one?

## 2018-06-12 ENCOUNTER — Telehealth: Payer: Self-pay | Admitting: Family Medicine

## 2018-06-12 NOTE — Telephone Encounter (Signed)
Copied from [place] [id number]. Topic: Quick Communication - See Telephone Encounter >> Jun 12, 2018 11:35 AM Rutherford Nail, NT wrote: CRM for notification. See Telephone encounter for: 06/12/18. Patient's daughter, Suzi Roots, calling and would like a call from Dr Nolon Rod regarding the blood sugars that she sent through the patient's MyChart on 06/10/2018. Please advise.

## 2018-06-12 NOTE — Telephone Encounter (Signed)
Pt daughter has been advised that sugars are ok and they will be addressed at next office visit.

## 2018-06-14 ENCOUNTER — Telehealth: Payer: Self-pay | Admitting: Family Medicine

## 2018-06-14 ENCOUNTER — Ambulatory Visit: Payer: Self-pay | Admitting: Cardiology

## 2018-06-14 NOTE — Telephone Encounter (Signed)
Copied from [place] [id number]. Topic: Quick Communication - See Telephone Encounter >> Jun 14, 2018  4:59 PM Blase Mess A wrote: CRM for notification. See Telephone encounter for: 06/14/18.  Patient's daughter is calling regarding a PA was denied for a MRA. Please advise Ambrose Pancoast [phone] & [phone] Thank you

## 2018-06-15 ENCOUNTER — Other Ambulatory Visit: Payer: Medicare HMO

## 2018-06-17 ENCOUNTER — Telehealth: Payer: Self-pay | Admitting: Family Medicine

## 2018-06-17 DIAGNOSIS — H814 Vertigo of central origin: Secondary | ICD-10-CM

## 2018-06-17 DIAGNOSIS — R42 Dizziness and giddiness: Secondary | ICD-10-CM

## 2018-06-17 DIAGNOSIS — I69359 Hemiplegia and hemiparesis following cerebral infarction affecting unspecified side: Secondary | ICD-10-CM

## 2018-06-17 DIAGNOSIS — E118 Type 2 diabetes mellitus with unspecified complications: Secondary | ICD-10-CM

## 2018-06-17 NOTE — Telephone Encounter (Signed)
Copied from [place] [id number]. Topic: General - Other >> Jun 17, 2018 12:05 PM Bea Graff, NT wrote: Reason for CRM: Pts daughter calling and states that GI states that the insurance denied for her mom to have the MRA and that the dr needs to send in detailed information of why the pt needs this MRA and resubmit PA. Please advise.

## 2018-06-17 NOTE — Telephone Encounter (Signed)
Pt daughter called in, she stated she as called in for several things the last few weeks. She wants to ask Dr Bridget Hartshorn about pt possible getting the new sugar pump for her arm because it is really hard to check her own sugar.  She wanted to make an appt to get in soon to address some the concerns she has called in about but the next available was April. Please advise    Best number Daughter-   [phone]

## 2018-06-18 ENCOUNTER — Telehealth: Payer: Self-pay | Admitting: Family Medicine

## 2018-06-18 NOTE — Telephone Encounter (Signed)
Patient questioning why the MRI was denied by the insurance. Insurances states that Dr. has to deem it necessary before it will be approved. Need prior authorization. CB: [phone]

## 2018-06-18 NOTE — Telephone Encounter (Signed)
Copied from [place] [id number]. Topic: Appointment Scheduling - Scheduling Inquiry for Clinic >> Jun 18, 2018  5:20 PM Gustavus Messing wrote: Reason for AVW:[id number] would like to understand why the MRI was denied by the insurance. Insurances states that Dr. Has to deem it necessary before it will be approved. Needs to prior authorization

## 2018-06-18 NOTE — Telephone Encounter (Signed)
Referral placed for neurology for pt and they will assess and determine if MRA is needed and do any necessary PA if deemed necessary.  Daughter made of aware of this by Dr Nolon Rod. Dgaddy, CMA

## 2018-06-18 NOTE — Telephone Encounter (Signed)
Dr Nolon Rod talked with daughter and added referrals and pt will come in on 06/26/2018 at 10 am -agreeable. Dgaddy, CMA

## 2018-06-18 NOTE — Telephone Encounter (Signed)
Please place referral for this patient who keeps having episodes of fluctuation of her sugars.   Please refer to Neurology

## 2018-06-18 NOTE — Telephone Encounter (Signed)
Dr. Stallings please advise. Dgaddy, CMA 

## 2018-06-18 NOTE — Telephone Encounter (Signed)
Spoke daughter [name] and offered appt 06/26/2018 at 10 am and she was agreeable.  Per daughter has concerns about pt that couldn't wait until an available appt in April.  Daughter advises insurance company needs more specific info on why pt needs MRI because they have denied it.  Advised I have sent message to stallings re: MRI PA and we would get back to her on the outcome from the insurance company.  Daughter advises she needs this taken care of asap as pt needs MRI to r/o other ? Conditions. Advised will contact her with update when available.  Daughter agreeable. Dgaddy, CMA

## 2018-06-20 ENCOUNTER — Encounter: Payer: Self-pay | Admitting: Internal Medicine

## 2018-06-20 NOTE — Telephone Encounter (Signed)
Please advise 

## 2018-06-21 ENCOUNTER — Ambulatory Visit: Payer: Medicare HMO | Admitting: *Deleted

## 2018-06-26 ENCOUNTER — Other Ambulatory Visit: Payer: Self-pay

## 2018-06-26 ENCOUNTER — Ambulatory Visit (INDEPENDENT_AMBULATORY_CARE_PROVIDER_SITE_OTHER): Payer: Medicare HMO | Admitting: Family Medicine

## 2018-06-26 ENCOUNTER — Encounter: Payer: Self-pay | Admitting: Family Medicine

## 2018-06-26 VITALS — BP 126/70 | HR 73 | Temp 98.0°F | Resp 16 | Ht 71.0 in | Wt 170.0 lb

## 2018-06-26 DIAGNOSIS — I493 Ventricular premature depolarization: Secondary | ICD-10-CM

## 2018-06-26 DIAGNOSIS — E785 Hyperlipidemia, unspecified: Secondary | ICD-10-CM | POA: Diagnosis not present

## 2018-06-26 DIAGNOSIS — Z79899 Other long term (current) drug therapy: Secondary | ICD-10-CM

## 2018-06-26 DIAGNOSIS — R42 Dizziness and giddiness: Secondary | ICD-10-CM | POA: Diagnosis not present

## 2018-06-26 DIAGNOSIS — E118 Type 2 diabetes mellitus with unspecified complications: Secondary | ICD-10-CM | POA: Diagnosis not present

## 2018-06-26 DIAGNOSIS — I10 Essential (primary) hypertension: Secondary | ICD-10-CM

## 2018-06-26 DIAGNOSIS — R69 Illness, unspecified: Secondary | ICD-10-CM | POA: Diagnosis not present

## 2018-06-26 LAB — GLUCOSE, POCT (MANUAL RESULT ENTRY): POC Glucose: 226 mg/dl — AB (ref 70–99)

## 2018-06-26 MED ORDER — METOPROLOL TARTRATE 25 MG PO TABS: 25.0000 mg | ORAL_TABLET | Freq: Every day | ORAL | 1 refills | Status: DC

## 2018-06-26 MED ORDER — ATORVASTATIN CALCIUM 10 MG PO TABS: ORAL_TABLET | ORAL | 1 refills | Status: DC

## 2018-06-26 MED ORDER — ONETOUCH VERIO W/DEVICE KIT: PACK | 0 refills | Status: DC

## 2018-06-26 MED ORDER — HYDROCHLOROTHIAZIDE 25 MG PO TABS: 25.0000 mg | ORAL_TABLET | Freq: Every day | ORAL | 1 refills | Status: DC

## 2018-06-26 NOTE — Patient Instructions (Addendum)
Instructions  Metoprolol take only once a day We have stopped the diltiazem because your blood pressure is fine off the medication Continue glipizide every other day  Discuss this with Endocrinology Check blood pressure twice a week Check your sugars less   Component                                   Latest Ref Rng & Units 06/26/2018  POC Glucose                                       70 - 99 mg/dl 226 (A)     If you have lab work done today you will be contacted with your lab results within the next 2 weeks.  If you have not heard from [place] then please contact us. The fastest way to get your results is to register for My Chart.   IF you received an x-ray today, you will receive an invoice from [facility]. Please contact [facility] at [phone] with questions or concerns regarding your invoice.   IF you received labwork today, you will receive an invoice from [facility]. Please contact [facility] at [phone] with questions or concerns regarding your invoice.   Our billing staff will not be able to assist you with questions regarding bills from these companies.  You will be contacted with the lab results as soon as they are available. The fastest way to get your results is to activate your My Chart account. Instructions are located on the last page of this paperwork. If you have not heard from [place] regarding the results in 2 weeks, please contact this office.     Panic Attack A panic attack is a sudden episode of severe anxiety, fear, or discomfort that causes physical and emotional symptoms. The attack may be in response to something frightening, or it may occur for no known reason. Symptoms of a panic attack can be similar to symptoms of a heart attack or stroke. It is important to see your health care provider when you have a panic attack so that these conditions can be ruled out. A panic attack is a symptom of another condition. Most panic attacks go away with  treatment of the underlying problem. If you have panic attacks often, you may have a condition called panic disorder. What are the causes? A panic attack may be caused by:  An extreme, life-threatening situation, such as a war or natural disaster.  An anxiety disorder, such as post-traumatic stress disorder.  Depression.  Certain medical conditions, including heart problems, neurological conditions, and infections.  Certain over-the-counter and prescription medicines.  Illegal drugs that increase heart rate and blood pressure, such as methamphetamine.  Alcohol.  Supplements that increase anxiety.  Panic disorder. What increases the risk? You are more likely to develop this condition if:  You have an anxiety disorder.  You have another mental health condition.  You take certain medicines.  You use alcohol, illegal drugs, or other substances.  You are under extreme stress.  A life event is causing increased feelings of anxiety and depression. What are the signs or symptoms? A panic attack starts suddenly, usually lasts about 20 minutes, and occurs with one or more of the following:  A pounding heart.  A feeling that your heart is beating irregularly or faster than  normal (palpitations).  Sweating.  Trembling or shaking.  Shortness of breath or feeling smothered.  Feeling choked.  Chest pain or discomfort.  Nausea or a strange feeling in your stomach.  Dizziness, feeling lightheaded, or feeling like you might faint.  Chills or hot flashes.  Numbness or tingling in your lips, hands, or feet.  Feeling confused, or feeling that you are not yourself.  Fear of losing control or being emotionally unstable.  Fear of dying. How is this diagnosed? A panic attack is diagnosed with an assessment by your health care provider. During the assessment your health care provider will ask questions about:  Your history of anxiety, depression, and panic attacks.  Your  medical history.  Whether you drink alcohol, use illegal drugs, take supplements, or take medicines. Be honest about your substance use. Your health care provider may also:  Order blood tests or other kinds of tests to rule out serious medical conditions.  Refer you to a mental health professional for further evaluation. How is this treated? Treatment depends on the cause of the panic attack:  If the cause is a medical problem, your health care provider will either treat that problem or refer you to a specialist.  If the cause is emotional, you may be given anti-anxiety medicines or referred to a counselor. These medicines may reduce how often attacks happen, reduce how severe the attacks are, and lower anxiety.  If the cause is a medicine, your health care provider may tell you to stop the medicine, change your dose, or take a different medicine.  If the cause is a drug, treatment may involve letting the drug wear off and taking medicine to help the drug leave your body or to counteract its effects. Attacks caused by drug abuse may continue even if you stop using the drug. Follow these instructions at home:  Take over-the-counter and prescription medicines only as told by your health care provider.  If you feel anxious, limit your caffeine intake.  Take good care of your physical and mental health by: ? Eating a balanced diet that includes plenty of fresh fruits and vegetables, whole grains, lean meats, and low-fat dairy. ? Getting plenty of rest. Try to get 7-8 hours of uninterrupted sleep each night. ? Exercising regularly. Try to get 30 minutes of physical activity at least 5 days a week. ? Not smoking. Talk to your health care provider if you need help quitting. ? Limiting alcohol intake to no more than 1 drink a day for nonpregnant women and 2 drinks a day for men. One drink equals 12 oz of beer, 5 oz of wine, or 1 oz of hard liquor.  Keep all follow-up visits as told by your  health care provider. This is important. Panic attacks may have underlying physical or emotional problems that take time to accurately diagnose. Contact a health care provider if:  Your symptoms do not improve, or they get worse.  You are not able to take your medicine as prescribed because of side effects. Get help right away if:  You have serious thoughts about hurting yourself or others.  You have symptoms of a panic attack. Do not drive yourself to the hospital. Have someone else drive you or call an ambulance. If you ever feel like you may hurt yourself or others, or you have thoughts about taking your own life, get help right away. You can go to your nearest emergency department or call:  Your local emergency services (911 in the  [place]  A suicide crisis helpline, such as the [facility] at 513-793-7545. This is open 24 hours a day. Summary  A panic attack is a sign of a serious health or mental health condition. Get help right away. Do not drive yourself to the hospital. Have someone else drive you or call an ambulance.  Always see a health care provider to have the reasons for the panic attack correctly diagnosed.  If your panic attack was caused by a physical problem, follow your health care provider's suggestions for medicine, referral to a specialist, and lifestyle changes.  If your panic attack was caused by an emotional problem, follow through with counseling from a qualified mental health specialist.  If you feel like you may hurt yourself or others, call 911 and get help right away. This information is not intended to replace advice given to you by your health care provider. Make sure you discuss any questions you have with your health care provider. Document Released: 04/10/2005 Document Revised: 05/19/2016 Document Reviewed: 05/19/2016 Elsevier Interactive Patient Education  2019 [employer].

## 2018-06-26 NOTE — Progress Notes (Signed)
Established Patient Office Visit  Subjective:  Patient ID: Tonya [name], female    DOB: [birth year]  Age: 78 y.o. MRN: [id number]  CC:  Chief Complaint  Patient presents with  . Dizziness    pt present in the office for 1 month follow-up for dizziness, pt states it seems to be getting worst since last OV.    . Fatigue    HPI Tonya [name] presents for   Diabetes Mellitus: Patient presents for follow up of diabetes.  Pt was getting readings of 200s  She has not been able get in with Endocrinology as yet  She continues to have fatigue and dizziness She is taking glipizide half tablet every other day   Hypertension  She reports that she is out of her diltiazem 180mg  ER for 2 weeks She is on metoprolol 25mg  bid and HCTZ daily She denies chest pain  Or palpitations BP Readings from Last 3 Encounters:  06/26/18 126/70  05/31/18 134/62  05/03/18 (!) 149/71   She is taking the metoprolol 25mg  but she is taking 2 tablets in the morning at the same time.   Weight loss She reports that she does a premium shakes and a piece of toast She is no longer using weight She eats a vegetable plate at 35:00XF She eats a small vegetable plate at 8:18EX then she has a small popcorn and tangerine at 7pm Wt Readings from Last 3 Encounters:  06/26/18 170 lb (77.1 kg)  05/31/18 173 lb (78.5 kg)  05/03/18 169 lb (76.7 kg)     Past Medical History:  Diagnosis Date  . Cerebrovascular accident ([place])   . Chronic edema    ? venous insufficiency  . COPD (chronic obstructive pulmonary disease) ([place])    never had a problem breathing  . Enlarged heart    per pt  . Gallstones   . GERD (gastroesophageal reflux disease)   . Hyperlipidemia   . Hypertension     Past Surgical History:  Procedure Laterality Date  . ABDOMINAL HYSTERECTOMY    . CHOLECYSTECTOMY      Family History  Problem Relation Age of Onset  . Cirrhosis Mother   . Heart disease Father   . Cancer Other   .  Hypertension Other   . Diabetes Other     Social History   Socioeconomic History  . Marital status: Widowed    Spouse name: Not on file  . Number of children: Not on file  . Years of education: Not on file  . Highest education level: Not on file  Occupational History  . Not on file  Social Needs  . Financial resource strain: Not on file  . Food insecurity:    Worry: Not on file    Inability: Not on file  . Transportation needs:    Medical: Not on file    Non-medical: Not on file  Tobacco Use  . Smoking status: Never Smoker  . Smokeless tobacco: Never Used  Substance and Sexual Activity  . Alcohol use: No    Alcohol/week: 0.0 standard drinks  . Drug use: No  . Sexual activity: Not Currently  Lifestyle  . Physical activity:    Days per week: Not on file    Minutes per session: Not on file  . Stress: Not on file  Relationships  . Social connections:    Talks on phone: Not on file    Gets together: Not on file    Attends religious service: Not on  file    Active member of club or organization: Not on file    Attends meetings of clubs or organizations: Not on file    Relationship status: Not on file  . Intimate partner violence:    Fear of current or ex partner: Not on file    Emotionally abused: Not on file    Physically abused: Not on file    Forced sexual activity: Not on file  Other Topics Concern  . Not on file  Social History Narrative  . Not on file    Outpatient Medications Prior to Visit  Medication Sig Dispense Refill  . aspirin 81 MG tablet Take 81 mg by mouth daily.      . Calcium Carbonate-Vitamin D (CALCIUM 600 + D PO) Take by mouth daily.      . Cholecalciferol 2000 units TABS Take 1 tablet (2,000 Units total) by mouth daily. 90 tablet 1  . cholestyramine (QUESTRAN) 4 G packet DISSOLVE 1 PACKET INTO 2 TO 6 OUNCES OF WATER AND DRINK 2 TIMES A DAY. 60 packet 11  . ferrous sulfate 325 (65 FE) MG tablet Take 1 tablet (325 mg total) by mouth 2 (two)  times daily with a meal. 180 tablet 1  . glipiZIDE (GLUCOTROL) 5 MG tablet Take 0.5 tablets (2.5 mg total) by mouth every other day. 90 tablet 3  . Lancets (ONETOUCH ULTRASOFT) lancets Prick finger 2 times a day to test blood glucose level 100 each 12  . ONETOUCH VERIO test strip 1 each by Other route 3 (three) times daily. USE TO TEST BLOOD SUGAR UP TO QID 100 each 0  . atorvastatin (LIPITOR) 10 MG tablet TAKE 1 TABLET(10 MG) BY MOUTH DAILY 90 tablet 1  . diltiazem (CARDIZEM CD) 180 MG 24 hr capsule TAKE 1 CAPSULE DAILY 90 capsule 1  . hydrochlorothiazide (HYDRODIURIL) 25 MG tablet Take 1 tablet (25 mg total) by mouth daily. 90 tablet 0  . metFORMIN (GLUCOPHAGE) 500 MG tablet     . metoprolol tartrate (LOPRESSOR) 25 MG tablet TAKE 1 TABLET TWICE A DAY 180 tablet 1   No facility-administered medications prior to visit.     Allergies  Allergen Reactions  . Hydrocodone   . Iohexol      Code: HIVES, Desc: PT developed hive and fullness in throat post injection of 125cc's Omni 300, Onset Date: 96789381   . Naproxen   . Propoxyphene N-Acetaminophen     ROS Review of Systems Review of Systems  Constitutional: Negative for activity change, appetite change, chills and fever.  HENT: Negative for congestion, nosebleeds, trouble swallowing and voice change.   Respiratory: Negative for cough, shortness of breath and wheezing.   Gastrointestinal: Negative for diarrhea, nausea and vomiting.  Genitourinary: Negative for difficulty urinating, dysuria, flank pain and hematuria.  Musculoskeletal: Negative for back pain, joint swelling and neck pain.  Neurological: Negative for dizziness, speech difficulty, light-headedness and numbness.  See HPI. All other review of systems negative.     Objective:    Physical Exam  BP 126/70   Pulse 73   Temp 98 F (36.7 C) (Oral)   Resp 16   Ht 5\' 11"  (1.803 m)   Wt 170 lb (77.1 kg)   SpO2 100%   BMI 23.71 kg/m  Wt Readings from Last 3 Encounters:    06/26/18 170 lb (77.1 kg)  05/31/18 173 lb (78.5 kg)  05/03/18 169 lb (76.7 kg)   Physical Exam  Constitutional: Oriented to person, place, and time.  Appears well-developed and well-nourished.  HENT:  Head: Normocephalic and atraumatic.  Eyes: Conjunctivae and EOM are normal.  Cardiovascular: Normal rate, regular rhythm, normal heart sounds and intact distal pulses.  No murmur heard. Pulmonary/Chest: Effort normal and breath sounds normal. No stridor. No respiratory distress. Has no wheezes.  Neurological: Is alert and oriented to person, place, and time.  Skin: Skin is warm. Capillary refill takes less than 2 seconds.  Psychiatric: Has a normal mood and affect. Behavior is normal. Judgment and thought content normal.    There are no preventive care reminders to display for this patient.  There are no preventive care reminders to display for this patient.  Lab Results  Component Value Date   TSH 2.420 03/30/2018   Lab Results  Component Value Date   WBC 4.9 03/30/2018   HGB 10.4 (L) 03/30/2018   HCT 34.0 03/30/2018   MCV 85 03/30/2018   PLT 307 03/30/2018   Lab Results  Component Value Date   NA 142 03/30/2018   K 4.0 03/30/2018   CO2 23 03/30/2018   GLUCOSE 158 (H) 03/30/2018   BUN 13 03/30/2018   CREATININE 0.74 03/30/2018   BILITOT 0.5 03/30/2018   ALKPHOS 75 03/30/2018   AST 12 03/30/2018   ALT 12 03/30/2018   PROT 6.4 03/30/2018   ALBUMIN 3.9 03/30/2018   CALCIUM 9.6 03/30/2018   GFR 94.31 10/10/2017   Lab Results  Component Value Date   CHOL 137 03/30/2018   Lab Results  Component Value Date   HDL 49 03/30/2018   Lab Results  Component Value Date   LDLCALC 72 03/30/2018   Lab Results  Component Value Date   TRIG 80 03/30/2018   Lab Results  Component Value Date   CHOLHDL 2.8 03/30/2018   Lab Results  Component Value Date   HGBA1C 5.7 (A) 05/03/2018      Assessment & Plan:   Problem List Items Addressed This Visit       Cardiovascular and Mediastinum   Essential hypertension   Relevant Medications   hydrochlorothiazide (HYDRODIURIL) 25 MG tablet   atorvastatin (LIPITOR) 10 MG tablet   metoprolol tartrate (LOPRESSOR) 25 MG tablet   Frequent PVCs   Relevant Medications   hydrochlorothiazide (HYDRODIURIL) 25 MG tablet   atorvastatin (LIPITOR) 10 MG tablet   metoprolol tartrate (LOPRESSOR) 25 MG tablet     Endocrine   Type 2 diabetes mellitus with complication, without long-term current use of insulin (HCC) - Primary   Relevant Medications   atorvastatin (LIPITOR) 10 MG tablet   Other Relevant Orders   POCT glucose (manual entry) (Completed)     Other   Hyperlipidemia with target LDL less than 100   Relevant Medications   hydrochlorothiazide (HYDRODIURIL) 25 MG tablet   atorvastatin (LIPITOR) 10 MG tablet   metoprolol tartrate (LOPRESSOR) 25 MG tablet    Other Visit Diagnoses    Polypharmacy       Dizziness and giddiness       Relevant Medications   hydrochlorothiazide (HYDRODIURIL) 25 MG tablet    hypertension and PVCs Will not refill the diltiazem Also pt was doubling her dose of metoprolol which was precribed for PVCs, continue some metoprolol but not bid Will plan for patient to only take hctz and metoprolol once daily  Diabetes Mellitus  She checked her sugars here in the office with POC and verified that her meter is accurate Patient has anxiety about her diabetes and checks her sugars very often She does  not tolerate metformin She did not tolerate farxiga She has dizziness that was related to hypoglycemia and she eats a very strict diabetic diet  Will refer to Endocrinology for evaluation though I believe she is doing well for her age Follow up with Dr. Loanne Drilling at 2:45pm on 06/27/2018  Panic attacks  Concerning that this patient is developing panic attacks about her health She has numerous funerals that she has been going to and is fearful of dying so she is very strict with her bp  and her sugars Some of the issues stems from not understanding her goals  Will monitor closely     Meds ordered this encounter  Medications  . hydrochlorothiazide (HYDRODIURIL) 25 MG tablet    Sig: Take 1 tablet (25 mg total) by mouth daily.    Dispense:  90 tablet    Refill:  1  . atorvastatin (LIPITOR) 10 MG tablet    Sig: TAKE 1 TABLET(10 MG) BY MOUTH DAILY    Dispense:  90 tablet    Refill:  1  . metoprolol tartrate (LOPRESSOR) 25 MG tablet    Sig: Take 1 tablet (25 mg total) by mouth daily.    Dispense:  90 tablet    Refill:  1    Follow-up: Return in about 2 months (around 08/26/2018) for anxiety about health.    Forrest Moron, MD

## 2018-06-27 ENCOUNTER — Encounter: Payer: Self-pay | Admitting: Endocrinology

## 2018-06-27 ENCOUNTER — Other Ambulatory Visit: Payer: Self-pay

## 2018-06-27 ENCOUNTER — Ambulatory Visit (INDEPENDENT_AMBULATORY_CARE_PROVIDER_SITE_OTHER): Payer: Medicare HMO | Admitting: Endocrinology

## 2018-06-27 VITALS — BP 162/82 | HR 97 | Ht 71.0 in | Wt 173.2 lb

## 2018-06-27 DIAGNOSIS — E118 Type 2 diabetes mellitus with unspecified complications: Secondary | ICD-10-CM

## 2018-06-27 LAB — POCT GLYCOSYLATED HEMOGLOBIN (HGB A1C): Hemoglobin A1C: 7 % — AB (ref 4.0–5.6)

## 2018-06-27 NOTE — Progress Notes (Signed)
Subjective:    Patient ID: Tonya [name], female    DOB: [birth year], 78 y.o.   MRN: [id number]  HPI pt is referred by Dr Nolon Rod, for diabetes.  Pt states DM was dx'ed in 2019; she has mild if any neuropathy of the lower extremities, but she has associated CVA; she took insulin only for a brief time after diagnosis; pt says her diet and exercise are good; she has never had GDM, pancreatitis, pancreatic surgery, severe hypoglycemia or DKA.  She says cbg's are approx 200.  She says her largest meal is lunch.  She did not tolerate metformin (abd pain).   Past Medical History:  Diagnosis Date  . Cerebrovascular accident ([place])   . Chronic edema    ? venous insufficiency  . COPD (chronic obstructive pulmonary disease) ([place])    never had a problem breathing  . Enlarged heart    per pt  . Gallstones   . GERD (gastroesophageal reflux disease)   . Hyperlipidemia   . Hypertension     Past Surgical History:  Procedure Laterality Date  . ABDOMINAL HYSTERECTOMY    . CHOLECYSTECTOMY      Social History   Socioeconomic History  . Marital status: Widowed    Spouse name: Not on file  . Number of children: Not on file  . Years of education: Not on file  . Highest education level: Not on file  Occupational History  . Not on file  Social Needs  . Financial resource strain: Not on file  . Food insecurity:    Worry: Not on file    Inability: Not on file  . Transportation needs:    Medical: Not on file    Non-medical: Not on file  Tobacco Use  . Smoking status: Never Smoker  . Smokeless tobacco: Never Used  Substance and Sexual Activity  . Alcohol use: No    Alcohol/week: 0.0 standard drinks  . Drug use: No  . Sexual activity: Not Currently  Lifestyle  . Physical activity:    Days per week: Not on file    Minutes per session: Not on file  . Stress: Not on file  Relationships  . Social connections:    Talks on phone: Not on file    Gets together: Not on file    Attends  religious service: Not on file    Active member of club or organization: Not on file    Attends meetings of clubs or organizations: Not on file    Relationship status: Not on file  . Intimate partner violence:    Fear of current or ex partner: Not on file    Emotionally abused: Not on file    Physically abused: Not on file    Forced sexual activity: Not on file  Other Topics Concern  . Not on file  Social History Narrative  . Not on file    Current Outpatient Medications on File Prior to Visit  Medication Sig Dispense Refill  . aspirin 81 MG tablet Take 81 mg by mouth daily.      Marland Kitchen atorvastatin (LIPITOR) 10 MG tablet TAKE 1 TABLET(10 MG) BY MOUTH DAILY 90 tablet 1  . Blood Glucose Monitoring Suppl (ONETOUCH VERIO) w/Device KIT Prick finger 2 times a day to test blood glucose level 1 kit 0  . Calcium Carbonate-Vitamin D (CALCIUM 600 + D PO) Take by mouth daily.      . Cholecalciferol 2000 units TABS Take 1 tablet (2,000 Units total) by  mouth daily. 90 tablet 1  . cholestyramine (QUESTRAN) 4 G packet DISSOLVE 1 PACKET INTO 2 TO 6 OUNCES OF WATER AND DRINK 2 TIMES A DAY. 60 packet 11  . ferrous sulfate 325 (65 FE) MG tablet Take 1 tablet (325 mg total) by mouth 2 (two) times daily with a meal. 180 tablet 1  . hydrochlorothiazide (HYDRODIURIL) 25 MG tablet Take 1 tablet (25 mg total) by mouth daily. 90 tablet 1  . Lancets (ONETOUCH ULTRASOFT) lancets Prick finger 2 times a day to test blood glucose level 100 each 12  . metoprolol tartrate (LOPRESSOR) 25 MG tablet Take 1 tablet (25 mg total) by mouth daily. 90 tablet 1  . ONETOUCH VERIO test strip 1 each by Other route 3 (three) times daily. USE TO TEST BLOOD SUGAR UP TO QID 100 each 0   No current facility-administered medications on file prior to visit.     Allergies  Allergen Reactions  . Hydrocodone   . Iohexol      Code: HIVES, Desc: PT developed hive and fullness in throat post injection of 125cc's Omni 300, Onset Date:  16109604   . Naproxen   . Propoxyphene N-Acetaminophen     Family History  Problem Relation Age of Onset  . Cirrhosis Mother   . Heart disease Father   . Cancer Other   . Hypertension Other   . Diabetes Other     BP (!) 162/82 (BP Location: Right Arm, Patient Position: Sitting, Cuff Size: Normal)   Pulse 97   Ht 5' 11" (1.803 m)   Wt 173 lb 3.2 oz (78.6 kg)   SpO2 96%   BMI 24.16 kg/m    Review of Systems denies blurry vision, headache, chest pain, sob, n/v, urinary frequency, muscle cramps, excessive diaphoresis, memory loss, and depression.  He reports weight loss.  Left sided weakness persists. She has anxiety, frequent urination, cold intolerance, easy bruising, and rhinorrhea.       Objective:   Physical Exam VS: see vs page GEN: no distress HEAD: head: no deformity eyes: no periorbital swelling, no proptosis external nose and ears are normal mouth: no lesion seen NECK: supple, thyroid is not enlarged CHEST WALL: no deformity LUNGS: clear to auscultation CV: reg rate and rhythm, no murmur ABD: abdomen is soft, nontender.  no hepatosplenomegaly.  not distended.  no hernia MUSCULOSKELETAL: muscle bulk and strength are grossly normal on the right, but decreased on the LUE and LLE.  no obvious joint swelling.  gait is normal and steady EXTEMITIES: no deformity.  no ulcer on the feet.  feet are of normal color and temp.  1+ bilat leg edema PULSES: dorsalis pedis intact bilat.  no carotid bruit NEURO:  cn 2-12 grossly intact.   readily moves all 4's.  sensation is intact to touch on the feet SKIN:  Normal texture and temperature.  No rash or suspicious lesion is visible.   NODES:  None palpable at the neck PSYCH: alert, well-oriented.  Does not appear anxious nor depressed.   Lab Results  Component Value Date   CREATININE 0.74 03/30/2018   BUN 13 03/30/2018   NA 142 03/30/2018   K 4.0 03/30/2018   CL 103 03/30/2018   CO2 23 03/30/2018   Lab Results   Component Value Date   HGBA1C 7.0 (A) 06/27/2018   I have reviewed outside records, and summarized:  Pt was noted to have type 2 DM, and referred here.  Other probs addressed were HTN and weight  loss.      Assessment & Plan:  Type 2 DM, with CVA: overcontrolled, for this (SU) rx.  Lean body habitus: she may be evolving type 1 DM--we'll follow. HTN: is noted today.  Edema: This limits rx options.   Patient Instructions  We'll recheck the blood pressure next time. good diet and exercise significantly improve the control of your diabetes.  please let me know if you wish to be referred to a dietician.  high blood sugar is very risky to your health.  you should see an eye doctor and dentist every year.  It is very important to get all recommended vaccinations.  Controlling your blood pressure and cholesterol drastically reduces the damage diabetes does to your body.  Those who smoke should quit.  Please discuss these with your doctor.  check your blood sugar once a day.  vary the time of day when you check, between before the 3 meals, and at bedtime.  also check if you have symptoms of your blood sugar being too high or too low.  please keep a record of the readings and bring it to your next appointment here (or you can bring the meter itself).  You can write it on any piece of paper.  please call us sooner if your blood sugar goes below 70, or if you have a lot of readings over 200. A different type of diabetes blood test is requested for you today.  We'll let you know about the results.  Based on the results, we'll change the glipizide to a pill called "Januvia."  If this is too expensive, we'll change it to generic "repaglinde."  Please call or message [place] next week, to tell us how the blood sugar is doing.   Please come back for a follow-up appointment in 2 weeks.

## 2018-06-27 NOTE — Patient Instructions (Addendum)
We'll recheck the blood pressure next time. good diet and exercise significantly improve the control of your diabetes.  please let me know if you wish to be referred to a dietician.  high blood sugar is very risky to your health.  you should see an eye doctor and dentist every year.  It is very important to get all recommended vaccinations.  Controlling your blood pressure and cholesterol drastically reduces the damage diabetes does to your body.  Those who smoke should quit.  Please discuss these with your doctor.  check your blood sugar once a day.  vary the time of day when you check, between before the 3 meals, and at bedtime.  also check if you have symptoms of your blood sugar being too high or too low.  please keep a record of the readings and bring it to your next appointment here (or you can bring the meter itself).  You can write it on any piece of paper.  please call us sooner if your blood sugar goes below 70, or if you have a lot of readings over 200. A different type of diabetes blood test is requested for you today.  We'll let you know about the results.  Based on the results, we'll change the glipizide to a pill called "Januvia."  If this is too expensive, we'll change it to generic "repaglinde."  Please call or message [place] next week, to tell us how the blood sugar is doing.   Please come back for a follow-up appointment in 2 weeks.

## 2018-06-29 LAB — FRUCTOSAMINE: Fructosamine: 290 umol/L — ABNORMAL HIGH (ref 205–285)

## 2018-06-29 MED ORDER — SITAGLIPTIN PHOSPHATE 50 MG PO TABS: 25.0000 mg | ORAL_TABLET | Freq: Every day | ORAL | 1 refills | Status: DC

## 2018-07-01 ENCOUNTER — Ambulatory Visit: Payer: Medicare HMO | Admitting: Cardiology

## 2018-07-04 ENCOUNTER — Ambulatory Visit: Payer: Medicare HMO | Admitting: Registered"

## 2018-07-04 ENCOUNTER — Encounter: Payer: Self-pay | Admitting: Endocrinology

## 2018-07-04 NOTE — Telephone Encounter (Signed)
Please advise 

## 2018-07-11 ENCOUNTER — Encounter: Payer: Self-pay | Admitting: Endocrinology

## 2018-07-11 ENCOUNTER — Other Ambulatory Visit: Payer: Self-pay

## 2018-07-11 MED ORDER — SITAGLIPTIN PHOSPHATE 50 MG PO TABS: 25.0000 mg | ORAL_TABLET | Freq: Every day | ORAL | 1 refills | Status: DC

## 2018-07-11 NOTE — Telephone Encounter (Signed)
Original message:  Dr dr. Loanne Drilling this is Tonya [name] my prescription got all mixed up because I thought that was the price of medicine and it was my co-pay so can you resend it to [facility] because they stopped it because I didn't have the money can you re-write the prescription because they tell me I do not have one on file thank you so much I know you are so busy thanks again

## 2018-07-11 NOTE — Telephone Encounter (Signed)
Please advise 

## 2018-07-11 NOTE — Telephone Encounter (Signed)
Please refer to the original message

## 2018-07-15 ENCOUNTER — Ambulatory Visit (INDEPENDENT_AMBULATORY_CARE_PROVIDER_SITE_OTHER): Payer: Medicare HMO | Admitting: Endocrinology

## 2018-07-15 ENCOUNTER — Encounter: Payer: Self-pay | Admitting: Endocrinology

## 2018-07-15 ENCOUNTER — Ambulatory Visit: Payer: Medicare HMO | Admitting: Registered"

## 2018-07-15 ENCOUNTER — Other Ambulatory Visit: Payer: Self-pay

## 2018-07-15 VITALS — BP 142/82 | HR 72 | Temp 98.4°F | Ht 71.0 in | Wt 172.0 lb

## 2018-07-15 DIAGNOSIS — E118 Type 2 diabetes mellitus with unspecified complications: Secondary | ICD-10-CM

## 2018-07-15 NOTE — Progress Notes (Signed)
Subjective:    Patient ID: Tonya [name], female    DOB: 05-26-1940, 78 y.o.   MRN: [id number]  HPI Pt returns for f/u of diabetes mellitus: DM type: 2 Dx'ed: 1660 Complications: CVA Therapy: 2 oral meds GDM: never DKA: never Severe hypoglycemia: never Pancreatitis: never Pancreatic imaging: normal on 2010 [place].  Other: lean body habitus raises possibility she is evolving type 1 DM; she did not tolerate metformin (abd pain); she took insulin only for a brief time after diagnosis; edema limits rx options Interval history: She has paid for the [place], but has not yet received in the mail.  She says cbg varies from 168-380.  pt states she feels well in general. Past Medical History:  Diagnosis Date  . Cerebrovascular accident ([place])   . Chronic edema    ? venous insufficiency  . COPD (chronic obstructive pulmonary disease) ([place])    never had a problem breathing  . Enlarged heart    per pt  . Gallstones   . GERD (gastroesophageal reflux disease)   . Hyperlipidemia   . Hypertension     Past Surgical History:  Procedure Laterality Date  . ABDOMINAL HYSTERECTOMY    . CHOLECYSTECTOMY      Social History   Socioeconomic History  . Marital status: Widowed    Spouse name: Not on file  . Number of children: Not on file  . Years of education: Not on file  . Highest education level: Not on file  Occupational History  . Not on file  Social Needs  . Financial resource strain: Not on file  . Food insecurity:    Worry: Not on file    Inability: Not on file  . Transportation needs:    Medical: Not on file    Non-medical: Not on file  Tobacco Use  . Smoking status: Never Smoker  . Smokeless tobacco: Never Used  Substance and Sexual Activity  . Alcohol use: No    Alcohol/week: 0.0 standard drinks  . Drug use: No  . Sexual activity: Not Currently  Lifestyle  . Physical activity:    Days per week: Not on file    Minutes per session: Not on file  . Stress: Not on file   Relationships  . Social connections:    Talks on phone: Not on file    Gets together: Not on file    Attends religious service: Not on file    Active member of club or organization: Not on file    Attends meetings of clubs or organizations: Not on file    Relationship status: Not on file  . Intimate partner violence:    Fear of current or ex partner: Not on file    Emotionally abused: Not on file    Physically abused: Not on file    Forced sexual activity: Not on file  Other Topics Concern  . Not on file  Social History Narrative  . Not on file    Current Outpatient Medications on File Prior to Visit  Medication Sig Dispense Refill  . aspirin 81 MG tablet Take 81 mg by mouth daily.      Marland Kitchen atorvastatin (LIPITOR) 10 MG tablet TAKE 1 TABLET(10 MG) BY MOUTH DAILY 90 tablet 1  . Blood Glucose Monitoring Suppl (ONETOUCH VERIO) w/Device KIT Prick finger 2 times a day to test blood glucose level 1 kit 0  . Calcium Carbonate-Vitamin D (CALCIUM 600 + D PO) Take by mouth daily.      Marland Kitchen  Cholecalciferol 2000 units TABS Take 1 tablet (2,000 Units total) by mouth daily. 90 tablet 1  . cholestyramine (QUESTRAN) 4 G packet DISSOLVE 1 PACKET INTO 2 TO 6 OUNCES OF WATER AND DRINK 2 TIMES A DAY. 60 packet 11  . ferrous sulfate 325 (65 FE) MG tablet Take 1 tablet (325 mg total) by mouth 2 (two) times daily with a meal. 180 tablet 1  . hydrochlorothiazide (HYDRODIURIL) 25 MG tablet Take 1 tablet (25 mg total) by mouth daily. 90 tablet 1  . Lancets (ONETOUCH ULTRASOFT) lancets Prick finger 2 times a day to test blood glucose level 100 each 12  . metoprolol tartrate (LOPRESSOR) 25 MG tablet Take 1 tablet (25 mg total) by mouth daily. 90 tablet 1  . ONETOUCH VERIO test strip 1 each by Other route 3 (three) times daily. USE TO TEST BLOOD SUGAR UP TO QID 100 each 0  . sitaGLIPtin (JANUVIA) 50 MG tablet Take 0.5 tablets (25 mg total) by mouth daily. 45 tablet 1   No current facility-administered medications on  file prior to visit.     Allergies  Allergen Reactions  . Hydrocodone   . Iohexol      Code: HIVES, Desc: PT developed hive and fullness in throat post injection of 125cc's Omni 300, Onset Date: [id number]   . Naproxen   . Propoxyphene N-Acetaminophen     Family History  Problem Relation Age of Onset  . Cirrhosis Mother   . Heart disease Father   . Cancer Other   . Hypertension Other   . Diabetes Other     BP (!) 142/82 (BP Location: Right Arm, Patient Position: Sitting, Cuff Size: Normal)   Pulse 72   Temp 98.4 F (36.9 C)   Ht '5\' 11"'  (1.803 m)   Wt 172 lb (78 kg)   SpO2 98%   BMI 23.99 kg/m   Review of Systems She denies hypoglycemia.      Objective:   Physical Exam VITAL SIGNS:  See vs page GENERAL: no distress Pulses: dorsalis pedis intact bilat.   MSK: no deformity of the feet CV: 1+ bilat leg edema Skin:  no ulcer on the feet.  normal color and temp on the feet. Neuro: sensation is intact to touch on the feet. Ext: There is bilateral onychomycosis of the toenails.    Lab Results  Component Value Date   CREATININE 0.74 03/30/2018   BUN 13 03/30/2018   NA 142 03/30/2018   K 4.0 03/30/2018   CL 103 03/30/2018   CO2 23 03/30/2018       Assessment & Plan:  Type 2 DM: worse.  HTN: is noted today.  Edema: This limits rx options.    Patient Instructions  Your blood pressure is high today.  Please see your primary care provider soon, to have it rechecked check your blood sugar once a day.  vary the time of day when you check, between before the 3 meals, and at bedtime.  also check if you have symptoms of your blood sugar being too high or too low.  please keep a record of the readings and bring it to your next appointment here (or you can bring the meter itself).  You can write it on any piece of paper.  please call us sooner if your blood sugar goes below 70, or if you have a lot of readings over 200. Please start taking the Januvia when you receive it.  Until you receive the Januvia, take glipizide as  you were taking before.   Please come back for a follow-up appointment in 1 month.

## 2018-07-15 NOTE — Patient Instructions (Addendum)
Your blood pressure is high today.  Please see your primary care provider soon, to have it rechecked check your blood sugar once a day.  vary the time of day when you check, between before the 3 meals, and at bedtime.  also check if you have symptoms of your blood sugar being too high or too low.  please keep a record of the readings and bring it to your next appointment here (or you can bring the meter itself).  You can write it on any piece of paper.  please call us sooner if your blood sugar goes below 70, or if you have a lot of readings over 200. Please start taking the Januvia when you receive it. Until you receive the Januvia, take glipizide as you were taking before.   Please come back for a follow-up appointment in 1 month.

## 2018-07-16 DIAGNOSIS — R69 Illness, unspecified: Secondary | ICD-10-CM | POA: Diagnosis not present

## 2018-07-23 NOTE — Progress Notes (Signed)
Subjective:  Primary Physician:  Forrest Moron, MD  Patient ID: Tonya [name], female    DOB: Mar 24, 1941, 78 y.o.   MRN: [id number]  Chief Complaint  Patient presents with  . New Patient (Initial Visit)    PVC, Dyspnea and dizziness  . Dizziness    HPI: Tonya [name]  is a 78 y.o. female  with Hypertension, hyperlipidemia, uncontrolled diabetes mellitus, history of stroke involving the right pons in 2001 with moderate diffuse atherosclerotic changes in the intracerebral vessels when she presented with left-sided weakness and slurred speech, lumbosacral spondylosis with resolution of back pain with steroid injection in the past, nonsmoker referred to me for evaluation of frequent episodes of dizziness and near syncope that started several months ago, dyspnea on exertion and abnormal EKG with frequent PVCs.    Patient is being followed by Dr. Renato Shin with regard to diabetes mellitus. Main complaint is also dizziness associated with feeling of near syncope, occurs mostly when she is standing.  She is to have it very frequently few months ago, for the past one to 2 months has noticed that symptoms improved but still continues to have episodes, described as sudden onset of lightheadedness and dizziness and feels like she is going to pass out.  Fortunately she has not had frank syncope, no fall.  She immediately sits down and feels better after a few minutes. Patient states that she's been having marked dyspnea on exertion, even climbing  5 steps to our office she was markedly dyspneic and had to stop.  No associated chest pain.  No PND or orthopnea.   Past Medical History:  Diagnosis Date  . Cerebrovascular accident ([place])   . Chronic edema    ? venous insufficiency  . COPD (chronic obstructive pulmonary disease) ([place])    never had a problem breathing  . Diabetes mellitus without complication ([place])   . Enlarged heart    per pt  . Gallstones   . GERD (gastroesophageal reflux  disease)   . Hyperlipidemia   . Hypertension     Past Surgical History:  Procedure Laterality Date  . ABDOMINAL HYSTERECTOMY    . CHOLECYSTECTOMY      Social History   Socioeconomic History  . Marital status: Widowed    Spouse name: Not on file  . Number of children: 5  . Years of education: Not on file  . Highest education level: Not on file  Occupational History  . Not on file  Social Needs  . Financial resource strain: Not on file  . Food insecurity:    Worry: Not on file    Inability: Not on file  . Transportation needs:    Medical: Not on file    Non-medical: Not on file  Tobacco Use  . Smoking status: Former Smoker    Packs/day: 0.25    Years: 1.00    Pack years: 0.25    Last attempt to quit: 07/23/1968    Years since quitting: 50.0  . Smokeless tobacco: Never Used  Substance and Sexual Activity  . Alcohol use: No    Alcohol/week: 0.0 standard drinks  . Drug use: No  . Sexual activity: Not Currently  Lifestyle  . Physical activity:    Days per week: Not on file    Minutes per session: Not on file  . Stress: Not on file  Relationships  . Social connections:    Talks on phone: Not on file    Gets together: Not on  file    Attends religious service: Not on file    Active member of club or organization: Not on file    Attends meetings of clubs or organizations: Not on file    Relationship status: Not on file  . Intimate partner violence:    Fear of current or ex partner: Not on file    Emotionally abused: Not on file    Physically abused: Not on file    Forced sexual activity: Not on file  Other Topics Concern  . Not on file  Social History Narrative  . Not on file    Current Outpatient Medications on File Prior to Visit  Medication Sig Dispense Refill  . aspirin 81 MG tablet Take 81 mg by mouth daily.      Marland Kitchen atorvastatin (LIPITOR) 10 MG tablet TAKE 1 TABLET(10 MG) BY MOUTH DAILY 90 tablet 1  . Calcium Carbonate-Vitamin D (CALCIUM 600 + D PO) Take  by mouth daily.      . Cholecalciferol 2000 units TABS Take 1 tablet (2,000 Units total) by mouth daily. 90 tablet 1  . cholestyramine (QUESTRAN) 4 G packet DISSOLVE 1 PACKET INTO 2 TO 6 OUNCES OF WATER AND DRINK 2 TIMES A DAY. 60 packet 11  . ferrous sulfate 325 (65 FE) MG tablet Take 1 tablet (325 mg total) by mouth 2 (two) times daily with a meal. 180 tablet 1  . sitaGLIPtin (JANUVIA) 50 MG tablet Take 0.5 tablets (25 mg total) by mouth daily. 45 tablet 1  . Blood Glucose Monitoring Suppl (ONETOUCH VERIO) w/Device KIT Prick finger 2 times a day to test blood glucose level 1 kit 0  . Lancets (ONETOUCH ULTRASOFT) lancets Prick finger 2 times a day to test blood glucose level 100 each 12  . ONETOUCH VERIO test strip 1 each by Other route 3 (three) times daily. USE TO TEST BLOOD SUGAR UP TO QID 100 each 0   No current facility-administered medications on file prior to visit.     Review of Systems  Constitution: Negative for chills, decreased appetite, malaise/fatigue and weight gain.  Cardiovascular: Positive for dyspnea on exertion and leg swelling (chronic and mild). Negative for syncope.  Endocrine: Negative for cold intolerance.  Hematologic/Lymphatic: Does not bruise/bleed easily.  Musculoskeletal: Positive for muscle weakness (left arm and left leg since stroke). Negative for falls and joint swelling.  Gastrointestinal: Negative for abdominal pain, anorexia and change in bowel habit.  Neurological: Positive for focal weakness (left arm and leg weakness), light-headedness and loss of balance. Negative for headaches and seizures.  Psychiatric/Behavioral: Negative for depression and substance abuse.  All other systems reviewed and are negative.     Objective:  Blood pressure (!) 157/83, pulse 67, height '5\' 11"'  (1.803 m), weight 171 lb (77.6 kg), SpO2 98 %. Body mass index is 23.85 kg/m.    07/24/18 9:47 AM  07/24/18 10:05 AM  07/24/18 10:06 AM     BP  161/79   157/83   160/83    BP Location   Right Arm   Right Arm   Right Arm    Patient Position  Supine   Sitting   Standing    Cuff Size  Normal   Normal   Normal    Pulse  62   67   78    SpO2  97%   98%   98%    Weight  171 lb 4.8 oz (77.7 kg)   171 lb (77.6 kg)   171  lb (77.6 kg)    Height  '5\' 11"'  (1.803 m)   '5\' 11"'  (1.803 m)   '5\' 11"'  (1.803 m)      Physical Exam  Constitutional: She appears well-nourished. No distress.  Moderately built  HENT:  Head: Atraumatic.  Eyes: Conjunctivae are normal.  Neck: Neck supple. No JVD present. No thyromegaly present.  Cardiovascular: Normal rate, regular rhythm, normal heart sounds and intact distal pulses.  Occasional extrasystoles are present. Exam reveals no gallop.  No murmur heard. Pulmonary/Chest: Effort normal and breath sounds normal.  Abdominal: Soft. Bowel sounds are normal.  Musculoskeletal: Normal range of motion.        General: Edema (1-2 plus ankle pitting edema) present.  Neurological: She is alert.  Grossly left arm weakness and mild contracture noted, left lower extremity weakness present.  Skin: Skin is warm and dry.  Psychiatric: She has a normal mood and affect.    Radiology: No results found.  Laboratory Examination:  CMP Latest Ref Rng & Units 03/30/2018 10/10/2017 07/17/2017  Glucose 65 - 99 mg/dL 158(H) 385(H) 267(H)  BUN 8 - 27 mg/dL '13 13 11  ' Creatinine 0.57 - 1.00 mg/dL 0.74 0.76 0.69  Sodium 134 - 144 mmol/L 142 137 139  Potassium 3.5 - 5.2 mmol/L 4.0 4.1 3.8  Chloride 96 - 106 mmol/L 103 100 103  CO2 20 - 29 mmol/L '23 31 29  ' Calcium 8.7 - 10.3 mg/dL 9.6 9.6 9.5  Total Protein 6.0 - 8.5 g/dL 6.4 - 7.1  Total Bilirubin 0.0 - 1.2 mg/dL 0.5 - 0.5  Alkaline Phos 39 - 117 IU/L 75 - 83  AST 0 - 40 IU/L 12 - 14  ALT 0 - 32 IU/L 12 - 12   CBC Latest Ref Rng & Units 03/30/2018 10/10/2017 07/17/2017  WBC 3.4 - 10.8 x10E3/uL 4.9 5.3 5.4  Hemoglobin 11.1 - 15.9 g/dL 10.4(L) 12.2 11.5(L)  Hematocrit 34.0 - 46.6 % 34.0 37.8 36.1  Platelets 150 - 450  x10E3/uL 307 265.0 266.0   Lipid Panel     Component Value Date/Time   CHOL 137 03/30/2018 1201   TRIG 80 03/30/2018 1201   HDL 49 03/30/2018 1201   CHOLHDL 2.8 03/30/2018 1201   CHOLHDL 3 07/17/2017 1227   VLDL 21.6 07/17/2017 1227   LDLCALC 72 03/30/2018 1201   HEMOGLOBIN A1C Lab Results  Component Value Date   HGBA1C 7.0 (A) 06/27/2018   TSH Recent Labs    03/30/18 1201  TSH 2.420    CARDIAC STUDIES:   Echocardiogram 10/09/2016: Normal LV systolic function, mild LVH, EF 55-60%.  Grade 1 diastolic dysfunction.  No significant valvular abnormality.  Lexiscan sestamibi stress test 09/29/16: Non-diagnostic EKG, medium-sized mild defect in the inferior lateral, mid anterior and mid inferolateral and apical anterior location which is not reversible with normal LV systolic function at 15%.  Low risk study and defect represents attenuation artifact.  No significant change from 2016.  Assessment:    Dizziness - Plan: CARDIAC EVENT MONITOR  Dyspnea on exertion - Plan: PCV ECHOCARDIOGRAM COMPLETE, PCV MYOCARDIAL PERFUSION WITH LEXISCAN  Abnormal EKG - Plan: PCV MYOCARDIAL PERFUSION WITH LEXISCAN, CANCELED: EKG 12-Lead  Uncontrolled diabetes mellitus without complication ([place]) - Plan: PCV MYOCARDIAL PERFUSION WITH LEXISCAN  Essential hypertension - Plan: PCV MYOCARDIAL PERFUSION WITH LEXISCAN, metoprolol tartrate (LOPRESSOR) 25 MG tablet, olmesartan-hydrochlorothiazide (BENICAR HCT) 20-12.5 MG tablet, Basic Metabolic Panel (BMET)  Hypercholesteremia  H/O ischemic right PCA stroke - right pons 2001: Residual left hemiparesis - Plan: PCV  MYOCARDIAL PERFUSION WITH LEXISCAN  Near syncope - Plan: CARDIAC EVENT MONITOR  Frequent PVCs - Plan: metoprolol tartrate (LOPRESSOR) 25 MG tablet  EKG 05/31/2018: Sinus rhythm with first-degree AV block at rate of 56 bpm, left axis deviation, left anterior fascicular block.  Cannot exclude inferior infarct old, anteroseptal infarct old.  No  evidence of ischemia.  EKG 09/19/2016: Normal sinus rhythm at the rate of 85 bpm, left axis deviation, poor R-wave progression, frequent PVCs in the form of ventricular bigeminy, ventricular couplets and triplets.  Recommendation:   Patient referred to me for evaluation of dizziness and marked dyspnea on exertion, although she has had a negative nuclear stress test about 3 years ago and echocardiogram did not reveal any significant abnormality, she needs repeat testing in view of uncontrolled diabetes, hyperlipidemia and hypertension as risk factors.  With regard to dizziness, differential diagnosis includes sick sinus syndrome, AV nodal disease.  She has underlying first-degree AV block and left anterior fascicular block/bifascicular block.  An event monitor for 30 days.  Due to Covid issues, symptoms ongoing for a long time, stress test and echocardiogram will be performed at a later date.  I have changed her metoprolol tartrate from taking 25 mg q. Daily to 12.5 mg b.i.d., I have added Benicar HCT 20/12.5 mg in view of hypertension and diabetes mellitus and discontinued complaining HCTZ.  She is not orthostatic today. Needs BMP in 2 weeks.  I have discussed with patient regarding the safety during COVID Pandemic and steps and precautions to be taken including social distancing, frequent hand wash and use of detergent soap, gels with the patient. I asked the patient to avoid touching mouth, nose, eyes, ears with her hands. I encouraged regular walking around the neighborhood and exercise and regular diet, as long as social distancing can be maintained.  Adrian Prows, MD, [facility] 07/24/2018, 11:51 AM [facility]. [place] Pager: [phone] Office: [phone] If no answer Cell [phone]

## 2018-07-24 ENCOUNTER — Other Ambulatory Visit: Payer: Self-pay

## 2018-07-24 ENCOUNTER — Other Ambulatory Visit: Payer: Self-pay | Admitting: Cardiology

## 2018-07-24 ENCOUNTER — Ambulatory Visit: Payer: Medicare HMO

## 2018-07-24 ENCOUNTER — Encounter: Payer: Self-pay | Admitting: Cardiology

## 2018-07-24 ENCOUNTER — Ambulatory Visit: Payer: Medicare HMO | Admitting: Cardiology

## 2018-07-24 VITALS — BP 160/83 | HR 78 | Ht 71.0 in | Wt 171.0 lb

## 2018-07-24 DIAGNOSIS — I10 Essential (primary) hypertension: Secondary | ICD-10-CM

## 2018-07-24 DIAGNOSIS — R55 Syncope and collapse: Secondary | ICD-10-CM

## 2018-07-24 DIAGNOSIS — I493 Ventricular premature depolarization: Secondary | ICD-10-CM | POA: Diagnosis not present

## 2018-07-24 DIAGNOSIS — R42 Dizziness and giddiness: Secondary | ICD-10-CM | POA: Diagnosis not present

## 2018-07-24 DIAGNOSIS — R9431 Abnormal electrocardiogram [ECG] [EKG]: Secondary | ICD-10-CM | POA: Diagnosis not present

## 2018-07-24 DIAGNOSIS — E1165 Type 2 diabetes mellitus with hyperglycemia: Secondary | ICD-10-CM

## 2018-07-24 DIAGNOSIS — E7800 Pure hypercholesterolemia, unspecified: Secondary | ICD-10-CM

## 2018-07-24 DIAGNOSIS — Z8673 Personal history of transient ischemic attack (TIA), and cerebral infarction without residual deficits: Secondary | ICD-10-CM

## 2018-07-24 DIAGNOSIS — IMO0001 Reserved for inherently not codable concepts without codable children: Secondary | ICD-10-CM

## 2018-07-24 DIAGNOSIS — R0609 Other forms of dyspnea: Secondary | ICD-10-CM | POA: Diagnosis not present

## 2018-07-24 MED ORDER — OLMESARTAN MEDOXOMIL-HCTZ 20-12.5 MG PO TABS: 1.0000 | ORAL_TABLET | Freq: Every day | ORAL | 1 refills | Status: DC

## 2018-07-24 MED ORDER — METOPROLOL TARTRATE 25 MG PO TABS: 12.5000 mg | ORAL_TABLET | Freq: Two times a day (BID) | ORAL | 1 refills | Status: DC

## 2018-07-30 ENCOUNTER — Telehealth (INDEPENDENT_AMBULATORY_CARE_PROVIDER_SITE_OTHER): Payer: Medicare HMO | Admitting: Family Medicine

## 2018-07-30 DIAGNOSIS — I10 Essential (primary) hypertension: Secondary | ICD-10-CM

## 2018-07-30 DIAGNOSIS — R42 Dizziness and giddiness: Secondary | ICD-10-CM | POA: Diagnosis not present

## 2018-07-30 DIAGNOSIS — R002 Palpitations: Secondary | ICD-10-CM | POA: Diagnosis not present

## 2018-07-30 DIAGNOSIS — E118 Type 2 diabetes mellitus with unspecified complications: Secondary | ICD-10-CM | POA: Diagnosis not present

## 2018-07-30 NOTE — Progress Notes (Signed)
CC- dizziness and syncope- Since been taken off medication the dizziness is somewhat better. Still get a little dizzy spells every now and then. Had an episode yesterday.

## 2018-07-30 NOTE — Progress Notes (Signed)
Telemedicine Encounter- SOAP NOTE Established Patient  This telephone encounter was conducted with the patient's (or proxy's) verbal consent via audio telecommunications: yes/no: Yes Patient was instructed to have this encounter in a suitably private space; and to only have persons present to whom they give permission to participate. In addition, patient identity was confirmed by use of name plus two identifiers (DOB and address).  I discussed the limitations, risks, security and privacy concerns of performing an evaluation and management service by telephone and the availability of in person appointments. I also discussed with the patient that there may be a patient responsible charge related to this service. The patient expressed understanding and agreed to proceed.  I spent a total of TIME; 0 MIN TO 60 MIN: 20 minutes talking with the patient or their proxy.  CC: dizziness  Subjective   Tonya [name] is a 78 y.o. established patient. Telephone visit today for  HPI   Dizziness Patient reports that she has some dizziness  She just got off her stationary bike She states that she might get slightly dizzy if she is working in the kitchen She takes a nap when she feels dizzy and this usually resolves her symptoms  She is currently wearing a cardiac monitor  Diabetes She reports that her sugars are typically 190s to 200 Lab Results  Component Value Date   HGBA1C 7.0 (A) 06/27/2018  she is taking her diabetes appt She sees Dr. Loanne Drilling for Diabetes She mostly eats green vegetables   BP Readings from Last 3 Encounters:  07/24/18 (!) 160/83  07/15/18 (!) 142/82  06/27/18 (!) 162/82   At home her bp is 140-150s/70-80s  Patient Active Problem List   Diagnosis Date Noted  . D-dimer, elevated 10/11/2017  . Leg edema, left 10/10/2017  . Lesion of lip 10/10/2017  . Type 2 diabetes mellitus with complication, without long-term current use of insulin ([place]) 07/19/2017  . Vitamin D  deficiency 07/17/2017  . Spinal stenosis of lumbar region 01/23/2017  . Frequent PVCs 11/23/2016  . Heart murmur 09/19/2016  . Iron deficiency anemia 05/17/2016  . Chronic right hip pain 06/08/2015  . Abnormal EKG 02/23/2015  . Bradycardia 10/20/2014  . Routine general medical examination at a health care facility 01/02/2013  . Hypertonicity of bladder 12/20/2009  . Hyperlipidemia with target LDL less than 100 12/14/2008  . GERD 11/03/2008  . Right lumbar radiculitis 05/18/2008  . Essential hypertension 07/26/2007  . COPD 07/26/2007  . CVA, old, hemiparesis ([place]) 07/26/2007    Past Medical History:  Diagnosis Date  . Cerebrovascular accident ([place])   . Chronic edema    ? venous insufficiency  . COPD (chronic obstructive pulmonary disease) ([place])    never had a problem breathing  . Diabetes mellitus without complication ([place])   . Enlarged heart    per pt  . Gallstones   . GERD (gastroesophageal reflux disease)   . Hyperlipidemia   . Hypertension     Current Outpatient Medications  Medication Sig Dispense Refill  . aspirin 81 MG tablet Take 81 mg by mouth daily.      Marland Kitchen atorvastatin (LIPITOR) 10 MG tablet TAKE 1 TABLET(10 MG) BY MOUTH DAILY 90 tablet 1  . Blood Glucose Monitoring Suppl (ONETOUCH VERIO) w/Device KIT Prick finger 2 times a day to test blood glucose level 1 kit 0  . Calcium Carbonate-Vitamin D (CALCIUM 600 + D PO) Take by mouth daily.      . Cholecalciferol 2000 units TABS  Take 1 tablet (2,000 Units total) by mouth daily. 90 tablet 1  . Lancets (ONETOUCH ULTRASOFT) lancets Prick finger 2 times a day to test blood glucose level 100 each 12  . metoprolol tartrate (LOPRESSOR) 25 MG tablet Take 0.5 tablets (12.5 mg total) by mouth 2 (two) times daily. 90 tablet 1  . olmesartan-hydrochlorothiazide (BENICAR HCT) 20-12.5 MG tablet TAKE 1 TABLET BY MOUTH DAILY 90 tablet 3  . ONETOUCH VERIO test strip 1 each by Other route 3 (three) times daily. USE TO TEST BLOOD SUGAR UP  TO QID 100 each 0  . sitaGLIPtin (JANUVIA) 50 MG tablet Take 0.5 tablets (25 mg total) by mouth daily. 45 tablet 1   No current facility-administered medications for this visit.     Allergies  Allergen Reactions  . Hydrocodone   . Iohexol      Code: HIVES, Desc: PT developed hive and fullness in throat post injection of 125cc's Omni 300, Onset Date: [id number]   . Naproxen   . Propoxyphene N-Acetaminophen     Social History   Socioeconomic History  . Marital status: Widowed    Spouse name: Not on file  . Number of children: 5  . Years of education: Not on file  . Highest education level: Not on file  Occupational History  . Not on file  Social Needs  . Financial resource strain: Not on file  . Food insecurity:    Worry: Not on file    Inability: Not on file  . Transportation needs:    Medical: Not on file    Non-medical: Not on file  Tobacco Use  . Smoking status: Former Smoker    Packs/day: 0.25    Years: 1.00    Pack years: 0.25    Last attempt to quit: 07/23/1968    Years since quitting: 50.0  . Smokeless tobacco: Never Used  Substance and Sexual Activity  . Alcohol use: No    Alcohol/week: 0.0 standard drinks  . Drug use: No  . Sexual activity: Not Currently  Lifestyle  . Physical activity:    Days per week: Not on file    Minutes per session: Not on file  . Stress: Not on file  Relationships  . Social connections:    Talks on phone: Not on file    Gets together: Not on file    Attends religious service: Not on file    Active member of club or organization: Not on file    Attends meetings of clubs or organizations: Not on file    Relationship status: Not on file  . Intimate partner violence:    Fear of current or ex partner: Not on file    Emotionally abused: Not on file    Physically abused: Not on file    Forced sexual activity: Not on file  Other Topics Concern  . Not on file  Social History Narrative  . Not on file    ROS See hpi Review of  Systems  Constitutional: Negative for activity change, appetite change, chills and fever.  HENT: Negative for congestion, nosebleeds, trouble swallowing and voice change.   Respiratory: Negative for cough, shortness of breath and wheezing.   Gastrointestinal: Negative for diarrhea, nausea and vomiting.  Genitourinary: Negative for difficulty urinating, dysuria, flank pain and hematuria.  Musculoskeletal: Negative for back pain, joint swelling and neck pain.  Neurological: see hpi See HPI. All other review of systems negative.   Objective   Vitals as reported by the patient:  There were no vitals filed for this visit.  Diagnoses and all orders for this visit:  Type 2 diabetes mellitus with complication, without long-term current use of insulin ([place])- a1c shows blood glucose at goal Discussed that she should continue balanced diet   Essential hypertension- bp elevated but pt getting cardiac evaluation Will not change any medications   Dizziness and giddiness-  Discussed that she should get the BMP that was ordered     I discussed the assessment and treatment plan with the patient. The patient was provided an opportunity to ask questions and all were answered. The patient agreed with the plan and demonstrated an understanding of the instructions.   The patient was advised to call back or seek an in-person evaluation if the symptoms worsen or if the condition fails to improve as anticipated.  I provided 20 minutes of non-face-to-face time during this encounter.  Forrest Moron, MD  Primary Care at [facility]

## 2018-07-30 NOTE — Patient Instructions (Signed)
° ° ° °  If you have lab work done today you will be contacted with your lab results within the next 2 weeks.  If you have not heard from us then please contact us. The fastest way to get your results is to register for My Chart. ° ° °IF you received an x-ray today, you will receive an invoice from [facility]. Please contact [facility] at [phone] with questions or concerns regarding your invoice.  ° °IF you received labwork today, you will receive an invoice from [facility]. Please contact [facility] at [phone] with questions or concerns regarding your invoice.  ° °Our billing staff will not be able to assist you with questions regarding bills from these companies. ° °You will be contacted with the lab results as soon as they are available. The fastest way to get your results is to activate your My Chart account. Instructions are located on the last page of this paperwork. If you have not heard from us regarding the results in 2 weeks, please contact this office. °  ° ° ° °

## 2018-08-06 ENCOUNTER — Encounter: Payer: Self-pay | Admitting: Family Medicine

## 2018-08-07 ENCOUNTER — Other Ambulatory Visit: Payer: Self-pay

## 2018-08-07 DIAGNOSIS — R69 Illness, unspecified: Secondary | ICD-10-CM | POA: Diagnosis not present

## 2018-08-07 DIAGNOSIS — E118 Type 2 diabetes mellitus with unspecified complications: Secondary | ICD-10-CM

## 2018-08-07 MED ORDER — ONETOUCH ULTRASOFT LANCETS MISC: 12 refills | Status: DC

## 2018-08-09 DIAGNOSIS — R69 Illness, unspecified: Secondary | ICD-10-CM | POA: Diagnosis not present

## 2018-08-16 ENCOUNTER — Encounter: Payer: Self-pay | Admitting: Cardiology

## 2018-08-16 NOTE — Progress Notes (Signed)
NSVT 16 beats asymptomatic notifies by [employer].  Will continue to monitor.

## 2018-08-20 ENCOUNTER — Ambulatory Visit (INDEPENDENT_AMBULATORY_CARE_PROVIDER_SITE_OTHER): Payer: Medicare HMO | Admitting: Endocrinology

## 2018-08-20 ENCOUNTER — Encounter: Payer: Medicare HMO | Admitting: Dietician

## 2018-08-20 ENCOUNTER — Encounter: Payer: Self-pay | Admitting: Endocrinology

## 2018-08-20 ENCOUNTER — Other Ambulatory Visit: Payer: Self-pay

## 2018-08-20 VITALS — BP 150/80 | HR 85 | Ht 71.0 in | Wt 172.6 lb

## 2018-08-20 DIAGNOSIS — E118 Type 2 diabetes mellitus with unspecified complications: Secondary | ICD-10-CM

## 2018-08-20 LAB — POCT GLYCOSYLATED HEMOGLOBIN (HGB A1C): Hemoglobin A1C: 7.2 % — AB (ref 4.0–5.6)

## 2018-08-20 MED ORDER — SITAGLIPTIN PHOSPHATE 50 MG PO TABS: 50.0000 mg | ORAL_TABLET | Freq: Every day | ORAL | 3 refills | Status: DC

## 2018-08-20 NOTE — Patient Instructions (Addendum)
Your blood pressure is high today.  Please see your primary care provider soon, to have it rechecked, and to check the lightheadedness.  check your blood sugar once a day.  vary the time of day when you check, between before the 3 meals, and at bedtime.  also check if you have symptoms of your blood sugar being too high or too low.  please keep a record of the readings and bring it to your next appointment here (or you can bring the meter itself).  You can write it on any piece of paper.  please call us sooner if your blood sugar goes below 70, or if you have a lot of readings over 200. I have sent a prescription to your pharmacy, to double the [place].  Please come back for a follow-up appointment in 2-3 months.

## 2018-08-20 NOTE — Progress Notes (Signed)
Subjective:    Patient ID: Tonya [name], female    DOB: 19-Jan-1941, 78 y.o.   MRN: [id number]  HPI  Pt returns for f/u of diabetes mellitus: DM type: 2 Dx'ed: 4709 Complications: CVA Therapy: Januvia (but lean body habitus raises possibility she is evolving type 1).   GDM: never DKA: never Severe hypoglycemia: never Pancreatitis: never Pancreatic imaging: normal on 2010 [place].  Other: she did not tolerate metformin-XR (abd pain); she took insulin only for a brief time after diagnosis; edema limits rx options.   Interval history: she brings her meter with her cbg's which I have reviewed today. cbg varies from 182-276.  Main symptom is chronic lightheadedness.  Past Medical History:  Diagnosis Date  . Cerebrovascular accident ([place])   . Chronic edema    ? venous insufficiency  . COPD (chronic obstructive pulmonary disease) ([place])    never had a problem breathing  . Diabetes mellitus without complication ([place])   . Enlarged heart    per pt  . Gallstones   . GERD (gastroesophageal reflux disease)   . Hyperlipidemia   . Hypertension     Past Surgical History:  Procedure Laterality Date  . ABDOMINAL HYSTERECTOMY    . CHOLECYSTECTOMY      Social History   Socioeconomic History  . Marital status: Widowed    Spouse name: Not on file  . Number of children: 5  . Years of education: Not on file  . Highest education level: Not on file  Occupational History  . Not on file  Social Needs  . Financial resource strain: Not on file  . Food insecurity:    Worry: Not on file    Inability: Not on file  . Transportation needs:    Medical: Not on file    Non-medical: Not on file  Tobacco Use  . Smoking status: Former Smoker    Packs/day: 0.25    Years: 1.00    Pack years: 0.25    Last attempt to quit: 07/23/1968    Years since quitting: 50.1  . Smokeless tobacco: Never Used  Substance and Sexual Activity  . Alcohol use: No    Alcohol/week: 0.0 standard drinks  . Drug use: No   . Sexual activity: Not Currently  Lifestyle  . Physical activity:    Days per week: Not on file    Minutes per session: Not on file  . Stress: Not on file  Relationships  . Social connections:    Talks on phone: Not on file    Gets together: Not on file    Attends religious service: Not on file    Active member of club or organization: Not on file    Attends meetings of clubs or organizations: Not on file    Relationship status: Not on file  . Intimate partner violence:    Fear of current or ex partner: Not on file    Emotionally abused: Not on file    Physically abused: Not on file    Forced sexual activity: Not on file  Other Topics Concern  . Not on file  Social History Narrative  . Not on file    Current Outpatient Medications on File Prior to Visit  Medication Sig Dispense Refill  . aspirin 81 MG tablet Take 81 mg by mouth daily.      Marland Kitchen atorvastatin (LIPITOR) 10 MG tablet TAKE 1 TABLET(10 MG) BY MOUTH DAILY 90 tablet 1  . Blood Glucose Monitoring Suppl (ONETOUCH VERIO) w/Device  KIT Prick finger 2 times a day to test blood glucose level 1 kit 0  . Calcium Carbonate-Vitamin D (CALCIUM 600 + D PO) Take by mouth daily.      . Cholecalciferol 2000 units TABS Take 1 tablet (2,000 Units total) by mouth daily. 90 tablet 1  . Lancets (ONETOUCH ULTRASOFT) lancets Ultra soft smooth 33 gauge. Prick finger 2 times a day to test blood glucose level 100 each 12  . metoprolol tartrate (LOPRESSOR) 25 MG tablet Take 0.5 tablets (12.5 mg total) by mouth 2 (two) times daily. 90 tablet 1  . olmesartan-hydrochlorothiazide (BENICAR HCT) 20-12.5 MG tablet TAKE 1 TABLET BY MOUTH DAILY 90 tablet 3  . ONETOUCH VERIO test strip 1 each by Other route 3 (three) times daily. USE TO TEST BLOOD SUGAR UP TO QID 100 each 0   No current facility-administered medications on file prior to visit.     Allergies  Allergen Reactions  . Hydrocodone   . Iohexol      Code: HIVES, Desc: PT developed hive and  fullness in throat post injection of 125cc's Omni 300, Onset Date: 40981191   . Naproxen   . Propoxyphene N-Acetaminophen     Family History  Problem Relation Age of Onset  . Cirrhosis Mother   . Heart disease Father   . Cancer Other   . Hypertension Other   . Diabetes Other     BP (!) 150/80   Pulse 85   Ht '5\' 11"'  (1.803 m)   Wt 172 lb 9.6 oz (78.3 kg)   SpO2 93%   BMI 24.07 kg/m    Review of Systems She denies hypoglycemia    Objective:   Physical Exam VITAL SIGNS:  See vs page GENERAL: no distress Pulses: dorsalis pedis intact bilat.   MSK: no deformity of the feet.  CV: 1+ bilat leg edema Skin:  no ulcer on the feet.  normal color and temp on the feet.  Neuro: sensation is intact to touch on the feet.   Ext: There is bilateral onychomycosis of the toenails. Several ingrown toenails.    Lab Results  Component Value Date   CREATININE 0.74 03/30/2018   BUN 13 03/30/2018   NA 142 03/30/2018   K 4.0 03/30/2018   CL 103 03/30/2018   CO2 23 03/30/2018   A1c=7.2%    Assessment & Plan:  Type 2 DM: she needs increased rx, if it can be done with a regimen that avoids or minimizes hypoglycemia.  HTN: is noted today.   Edema: This limits rx options.   Lightheadedness: not DM-related.   Patient Instructions  Your blood pressure is high today.  Please see your primary care provider soon, to have it rechecked, and to check the lightheadedness.  check your blood sugar once a day.  vary the time of day when you check, between before the 3 meals, and at bedtime.  also check if you have symptoms of your blood sugar being too high or too low.  please keep a record of the readings and bring it to your next appointment here (or you can bring the meter itself).  You can write it on any piece of paper.  please call us sooner if your blood sugar goes below 70, or if you have a lot of readings over 200. I have sent a prescription to your pharmacy, to double the [place].  Please  come back for a follow-up appointment in 2-3 months.

## 2018-08-23 ENCOUNTER — Other Ambulatory Visit: Payer: Self-pay

## 2018-08-23 DIAGNOSIS — E118 Type 2 diabetes mellitus with unspecified complications: Secondary | ICD-10-CM

## 2018-08-23 DIAGNOSIS — I10 Essential (primary) hypertension: Secondary | ICD-10-CM

## 2018-08-23 NOTE — Progress Notes (Signed)
.  comp

## 2018-08-26 ENCOUNTER — Other Ambulatory Visit: Payer: Self-pay

## 2018-08-26 DIAGNOSIS — E118 Type 2 diabetes mellitus with unspecified complications: Secondary | ICD-10-CM

## 2018-08-26 MED ORDER — SITAGLIPTIN PHOSPHATE 50 MG PO TABS: 50.0000 mg | ORAL_TABLET | Freq: Every day | ORAL | 3 refills | Status: DC

## 2018-08-27 ENCOUNTER — Ambulatory Visit: Payer: Medicare HMO | Admitting: Family Medicine

## 2018-08-28 ENCOUNTER — Ambulatory Visit (INDEPENDENT_AMBULATORY_CARE_PROVIDER_SITE_OTHER): Payer: Medicare HMO

## 2018-08-28 ENCOUNTER — Other Ambulatory Visit: Payer: Self-pay

## 2018-08-28 ENCOUNTER — Encounter: Payer: Self-pay | Admitting: Family Medicine

## 2018-08-28 DIAGNOSIS — R0609 Other forms of dyspnea: Secondary | ICD-10-CM

## 2018-09-02 ENCOUNTER — Other Ambulatory Visit: Payer: Self-pay

## 2018-09-02 ENCOUNTER — Ambulatory Visit (INDEPENDENT_AMBULATORY_CARE_PROVIDER_SITE_OTHER): Payer: Medicare HMO | Admitting: Family Medicine

## 2018-09-02 ENCOUNTER — Encounter: Payer: Self-pay | Admitting: Family Medicine

## 2018-09-02 VITALS — BP 158/70 | HR 87 | Temp 98.6°F | Resp 17 | Ht 71.0 in | Wt 173.4 lb

## 2018-09-02 DIAGNOSIS — I10 Essential (primary) hypertension: Secondary | ICD-10-CM | POA: Diagnosis not present

## 2018-09-02 DIAGNOSIS — E118 Type 2 diabetes mellitus with unspecified complications: Secondary | ICD-10-CM

## 2018-09-02 DIAGNOSIS — R42 Dizziness and giddiness: Secondary | ICD-10-CM

## 2018-09-02 DIAGNOSIS — I493 Ventricular premature depolarization: Secondary | ICD-10-CM | POA: Diagnosis not present

## 2018-09-02 MED ORDER — METOPROLOL TARTRATE 25 MG PO TABS: 12.5000 mg | ORAL_TABLET | Freq: Two times a day (BID) | ORAL | 0 refills | Status: DC

## 2018-09-02 NOTE — Progress Notes (Signed)
Established Patient Office Visit  Subjective:  Patient ID: Tonya [name], female    DOB: 04/29/1940  Age: 78 y.o. MRN: [id number]  CC:  Chief Complaint  Patient presents with  . Hypertension    HPI Tonya [name] presents for   Follow up for hypertension  Patient reports that she has been taking medications as prescribed by cardiology She states that her blood pressure is still too high for her makes her very uncomfortable with these readings. Sometimes when she checks them at home they can be lower in the 120s to 130s but she is very anxious about her blood pressure. She feels like she is on a lot of medications and wants to just get off the meds. She denies any chest pains but has intermittent palpitations.  Per cardiology she has history of PVCs on her Holter monitor that are quite frequent.  BP Readings from Last 3 Encounters:  09/30/18 128/79  09/04/18 (!) 148/70  09/02/18 (!) 158/70   Dizziness and giddiness  Patient has a history of continued dizziness with the feeling that she gets quite flushed.  She denies that is associated with a direct panic attack but feels like it makes her very anxious.  Her anxiety causes her to get even more dizzy.  She states that she has to hold onto furniture and she just does not know why this keeps happening to her.  She drinks plenty of water.  She takes her medications as instructed.  She sticks to a very strict diabetic diet.  She has not fallen recently.  Diabetes mellitus type 2 Patient reports that she only eats a small piece of fruit if that for breakfast.  She eats a salad and some small piece of potato or something like that for her lunch and a very small dinner.  She is taking her medications as instructed.  She sees endocrinology.   Past Medical History:  Diagnosis Date  . Cerebrovascular accident ([place])   . Chronic edema    ? venous insufficiency  . COPD (chronic obstructive pulmonary disease) ([place])    never had a problem  breathing  . Diabetes mellitus without complication ([place])   . Enlarged heart    per pt  . Gallstones   . GERD (gastroesophageal reflux disease)   . Hyperlipidemia   . Hypertension     Past Surgical History:  Procedure Laterality Date  . ABDOMINAL HYSTERECTOMY    . CHOLECYSTECTOMY      Family History  Problem Relation Age of Onset  . Cirrhosis Mother   . Heart disease Father   . Cancer Other   . Hypertension Other   . Diabetes Other     Social History   Socioeconomic History  . Marital status: Widowed    Spouse name: Not on file  . Number of children: 5  . Years of education: Not on file  . Highest education level: Not on file  Occupational History  . Not on file  Social Needs  . Financial resource strain: Not on file  . Food insecurity    Worry: Not on file    Inability: Not on file  . Transportation needs    Medical: Not on file    Non-medical: Not on file  Tobacco Use  . Smoking status: Former Smoker    Packs/day: 0.25    Years: 1.00    Pack years: 0.25    Quit date: 07/23/1968    Years since quitting: 50.2  . Smokeless  tobacco: Never Used  Substance and Sexual Activity  . Alcohol use: No    Alcohol/week: 0.0 standard drinks  . Drug use: No  . Sexual activity: Not Currently  Lifestyle  . Physical activity    Days per week: Not on file    Minutes per session: Not on file  . Stress: Not on file  Relationships  . Social Herbalist on phone: Not on file    Gets together: Not on file    Attends religious service: Not on file    Active member of club or organization: Not on file    Attends meetings of clubs or organizations: Not on file    Relationship status: Not on file  . Intimate partner violence    Fear of current or ex partner: Not on file    Emotionally abused: Not on file    Physically abused: Not on file    Forced sexual activity: Not on file  Other Topics Concern  . Not on file  Social History Narrative  . Not on file     Outpatient Medications Prior to Visit  Medication Sig Dispense Refill  . aspirin 81 MG tablet Take 81 mg by mouth daily.      Marland Kitchen atorvastatin (LIPITOR) 10 MG tablet TAKE 1 TABLET(10 MG) BY MOUTH DAILY 90 tablet 1  . Blood Glucose Monitoring Suppl (ONETOUCH VERIO) w/Device KIT Prick finger 2 times a day to test blood glucose level 1 kit 0  . Cholecalciferol 2000 units TABS Take 1 tablet (2,000 Units total) by mouth daily. 90 tablet 1  . Lancets (ONETOUCH ULTRASOFT) lancets Ultra soft smooth 33 gauge. Prick finger 2 times a day to test blood glucose level (Patient taking differently: One touch verio  smooth 33 gauge. Prick finger 2 times a day to test blood glucose level) 100 each 12  . olmesartan-hydrochlorothiazide (BENICAR HCT) 20-12.5 MG tablet TAKE 1 TABLET BY MOUTH DAILY 90 tablet 3  . ONETOUCH VERIO test strip 1 each by Other route 3 (three) times daily. USE TO TEST BLOOD SUGAR UP TO QID 100 each 0  . sitaGLIPtin (JANUVIA) 50 MG tablet Take 1 tablet (50 mg total) by mouth daily. 90 tablet 3  . Calcium Carbonate-Vitamin D (CALCIUM 600 + D PO) Take by mouth daily.      . metoprolol tartrate (LOPRESSOR) 25 MG tablet Take 0.5 tablets (12.5 mg total) by mouth 2 (two) times daily. 90 tablet 1   No facility-administered medications prior to visit.     Allergies  Allergen Reactions  . Hydrocodone   . Iohexol      Code: HIVES, Desc: PT developed hive and fullness in throat post injection of 125cc's Omni 300, Onset Date: [id number]   . Naproxen   . Propoxyphene N-Acetaminophen     ROS Review of Systems See hpi    Objective:    Physical Exam  BP (!) 158/70 (BP Location: Right Arm, Patient Position: Sitting, Cuff Size: Normal)   Pulse 87   Temp 98.6 F (37 C) (Oral)   Resp 17   Ht _0  (1.803 m)   Wt 173 lb 6.4 oz (78.7 kg)   SpO2 98%   BMI 24.18 kg/m  Wt Readings from Last 3 Encounters:  09/30/18 169 lb 3.2 oz (76.7 kg)  09/04/18 174 lb 4.8 oz (79.1 kg)  09/02/18 173 lb 6.4  oz (78.7 kg)    Physical Exam  Constitutional: Oriented to person, place, and time. Appears well-developed  and well-nourished.  HENT:  Head: Normocephalic and atraumatic.  Eyes: Conjunctivae and EOM are normal.  Cardiovascular: Normal rate, regular rhythm, normal heart sounds and intact distal pulses.  No murmur heard. Pulmonary/Chest: Effort normal and breath sounds normal. No stridor. No respiratory distress. Has no wheezes.  Neurological: Is alert and oriented to person, place, and time. Left side weakness in the upper and lower extremity, ambulates with a cane Skin: Skin is warm. Capillary refill takes less than 2 seconds.  Psychiatric: Has a normal mood and affect. Behavior is normal. Judgment and thought content normal.   There are no preventive care reminders to display for this patient.  There are no preventive care reminders to display for this patient.  Lab Results  Component Value Date   TSH 2.420 03/30/2018   Lab Results  Component Value Date   WBC 4.9 03/30/2018   HGB 10.4 (L) 03/30/2018   HCT 34.0 03/30/2018   MCV 85 03/30/2018   PLT 307 03/30/2018   Lab Results  Component Value Date   NA 139 09/30/2018   K 4.3 09/30/2018   CO2 23 09/30/2018   GLUCOSE 195 (H) 09/30/2018   BUN 10 09/30/2018   CREATININE 0.63 09/30/2018   BILITOT 0.5 03/30/2018   ALKPHOS 75 03/30/2018   AST 12 03/30/2018   ALT 12 03/30/2018   PROT 6.4 03/30/2018   ALBUMIN 3.9 03/30/2018   CALCIUM 9.2 09/30/2018   GFR 94.31 10/10/2017   Lab Results  Component Value Date   CHOL 137 03/30/2018   Lab Results  Component Value Date   HDL 49 03/30/2018   Lab Results  Component Value Date   LDLCALC 72 03/30/2018   Lab Results  Component Value Date   TRIG 80 03/30/2018   Lab Results  Component Value Date   CHOLHDL 2.8 03/30/2018   Lab Results  Component Value Date   HGBA1C 7.2 (A) 08/20/2018      Assessment & Plan:   Problem List Items Addressed This Visit       Cardiovascular and Mediastinum   Essential hypertension - Primary bp not at goal, believe there is a component of anxiety and white coat hypertension   Relevant Medications   metoprolol tartrate (LOPRESSOR) 25 MG tablet   Frequent PVCs  -  Discussed maintaining hydration as dehydration worsens pvcs   Relevant Medications   metoprolol tartrate (LOPRESSOR) 25 MG tablet     Endocrine   Type 2 diabetes mellitus with complication, without long-term current use of insulin ([place]) - continue Endocrinology recommendations Call to read off readings      Other Visit Diagnoses    Dizziness and giddiness   -  Could be due to underlying Neurologic condition or related to panic disorder       Meds ordered this encounter  Medications  . DISCONTD: metoprolol tartrate (LOPRESSOR) 25 MG tablet    Sig: Take 0.5 tablets (12.5 mg total) by mouth 2 (two) times daily.    Dispense:  60 tablet    Refill:  0  . metoprolol tartrate (LOPRESSOR) 25 MG tablet    Sig: Take 0.5 tablets (12.5 mg total) by mouth 2 (two) times daily.    Dispense:  60 tablet    Refill:  0    Follow-up: Return in about 4 weeks (around 09/30/2018) for follow up on Cardiology recommendations.    Forrest Moron, MD

## 2018-09-02 NOTE — Patient Instructions (Addendum)
If you have lab work done today you will be contacted with your lab results within the next 2 weeks.  If you have not heard from [place] then please contact us. The fastest way to get your results is to register for My Chart.   IF you received an x-ray today, you will receive an invoice from [facility]. Please contact [facility] at [phone] with questions or concerns regarding your invoice.   IF you received labwork today, you will receive an invoice from [place]. Please contact [facility] at [phone] with questions or concerns regarding your invoice.   Our billing staff will not be able to assist you with questions regarding bills from these companies.  You will be contacted with the lab results as soon as they are available. The fastest way to get your results is to activate your My Chart account. Instructions are located on the last page of this paperwork. If you have not heard from [place] regarding the results in 2 weeks, please contact this office.     Palpitations Palpitations are feelings that your heartbeat is irregular or is faster than normal. It may feel like your heart is fluttering or skipping a beat. Palpitations are usually not a serious problem. They may be caused by many things, including smoking, caffeine, alcohol, stress, and certain medicines or drugs. Most causes of palpitations are not serious. However, some palpitations can be a sign of a serious problem. You may need further tests to rule out serious medical problems. Follow these instructions at home:     Pay attention to any changes in your condition. Take these actions to help manage your symptoms: Eating and drinking  Avoid foods and drinks that may cause palpitations. These may include: ? Caffeinated coffee, tea, soft drinks, diet pills, and energy drinks. ? Chocolate. ? Alcohol. Lifestyle  Take steps to reduce your stress and anxiety. Things that can help you relax  include: ? Yoga. ? Mind-body activities, such as deep breathing, meditation, or using words and images to create positive thoughts (guided imagery). ? Physical activity, such as swimming, jogging, or walking. Tell your health care provider if your palpitations increase with activity. If you have chest pain or shortness of breath with activity, do not continue the activity until you are seen by your health care provider. ? Biofeedback. This is a method that helps you learn to use your mind to control things in your body, such as your heartbeat.  Do not use drugs, including cocaine or ecstasy. Do not use marijuana.  Get plenty of rest and sleep. Keep a regular bed time. General instructions  Take over-the-counter and prescription medicines only as told by your health care provider.  Do not use any products that contain nicotine or tobacco, such as cigarettes and e-cigarettes. If you need help quitting, ask your health care provider.  Keep all follow-up visits as told by your health care provider. This is important. These may include visits for further testing if palpitations do not go away or get worse. Contact a health care provider if you:  Continue to have a fast or irregular heartbeat after 24 hours.  Notice that your palpitations occur more often. Get help right away if you:  Have chest pain or shortness of breath.  Have a severe headache.  Feel dizzy or you faint. Summary  Palpitations are feelings that your heartbeat is irregular or is faster than normal. It may feel like your heart is fluttering or skipping a  beat.  Palpitations may be caused by many things, including smoking, caffeine, alcohol, stress, certain medicines, and drugs.  Although most causes of palpitations are not serious, some causes can be a sign of a serious medical problem.  Get help right away if you faint or have chest pain, shortness of breath, a severe headache, or dizziness. This information is not  intended to replace advice given to you by your health care provider. Make sure you discuss any questions you have with your health care provider. Document Released: 04/07/2000 Document Revised: 05/23/2017 Document Reviewed: 05/23/2017 Elsevier Interactive Patient Education  2019 [employer].

## 2018-09-04 ENCOUNTER — Encounter: Payer: Self-pay | Admitting: Cardiology

## 2018-09-04 ENCOUNTER — Ambulatory Visit (INDEPENDENT_AMBULATORY_CARE_PROVIDER_SITE_OTHER): Payer: Medicare HMO | Admitting: Cardiology

## 2018-09-04 ENCOUNTER — Other Ambulatory Visit: Payer: Self-pay

## 2018-09-04 VITALS — BP 148/70 | HR 69 | Temp 97.6°F | Ht 72.0 in | Wt 174.3 lb

## 2018-09-04 DIAGNOSIS — Z8673 Personal history of transient ischemic attack (TIA), and cerebral infarction without residual deficits: Secondary | ICD-10-CM

## 2018-09-04 DIAGNOSIS — R0609 Other forms of dyspnea: Secondary | ICD-10-CM | POA: Diagnosis not present

## 2018-09-04 DIAGNOSIS — R5381 Other malaise: Secondary | ICD-10-CM

## 2018-09-04 DIAGNOSIS — R42 Dizziness and giddiness: Secondary | ICD-10-CM | POA: Diagnosis not present

## 2018-09-04 DIAGNOSIS — R5383 Other fatigue: Secondary | ICD-10-CM | POA: Diagnosis not present

## 2018-09-04 DIAGNOSIS — I472 Ventricular tachycardia: Secondary | ICD-10-CM

## 2018-09-04 DIAGNOSIS — I4729 Other ventricular tachycardia: Secondary | ICD-10-CM

## 2018-09-04 DIAGNOSIS — I493 Ventricular premature depolarization: Secondary | ICD-10-CM | POA: Diagnosis not present

## 2018-09-04 NOTE — Progress Notes (Signed)
Primary Physician/Referring:  Forrest Moron, MD  Patient ID: Tonya [name], female    DOB: Aug 12, 1940, 78 y.o.   MRN: [id number]  Chief Complaint  Patient presents with  . Dizziness  . Hypertension  . Follow-up    4wk    HPI: Tonya [name]  is a 78 y.o. female  with Hypertension, hyperlipidemia, uncontrolled diabetes mellitus, history of stroke involving the right pons in 2001 with moderate diffuse atherosclerotic changes in the intracerebral vessels when she presented with left-sided weakness and slurred speech, lumbosacral spondylosis with resolution of back pain with steroid injection in the past, nonsmoker referred to me for evaluation of frequent episodes of dizziness and near syncope that started several months ago, dyspnea on exertion and abnormal EKG with frequent PVCs.    Described as sudden onset of lightheadedness and dizziness and feels like she is going to pass out.  Fortunately she has not had frank syncope, no fall.  She immediately sits down and feels better after a few minutes. Episodes have noever occurred while sitting or laying down.  Patient states that she's been having marked dyspnea on exertion. No associated chest pain.  No PND or orthopnea. Her daughter is present. She also c/o fatigue. She underwent event monitor and echocardiogram and presents for follow.  She is tolerating Benicar HCT for hypertension. This is a 4 week OV.   Past Medical History:  Diagnosis Date  . Cerebrovascular accident ([place])   . Chronic edema    ? venous insufficiency  . COPD (chronic obstructive pulmonary disease) ([place])    never had a problem breathing  . Diabetes mellitus without complication ([place])   . Enlarged heart    per pt  . Gallstones   . GERD (gastroesophageal reflux disease)   . Hyperlipidemia   . Hypertension     Past Surgical History:  Procedure Laterality Date  . ABDOMINAL HYSTERECTOMY    . CHOLECYSTECTOMY      Social History   Socioeconomic History  .  Marital status: Widowed    Spouse name: Not on file  . Number of children: 5  . Years of education: Not on file  . Highest education level: Not on file  Occupational History  . Not on file  Social Needs  . Financial resource strain: Not on file  . Food insecurity:    Worry: Not on file    Inability: Not on file  . Transportation needs:    Medical: Not on file    Non-medical: Not on file  Tobacco Use  . Smoking status: Former Smoker    Packs/day: 0.25    Years: 1.00    Pack years: 0.25    Last attempt to quit: 07/23/1968    Years since quitting: 50.1  . Smokeless tobacco: Never Used  Substance and Sexual Activity  . Alcohol use: No    Alcohol/week: 0.0 standard drinks  . Drug use: No  . Sexual activity: Not Currently  Lifestyle  . Physical activity:    Days per week: Not on file    Minutes per session: Not on file  . Stress: Not on file  Relationships  . Social connections:    Talks on phone: Not on file    Gets together: Not on file    Attends religious service: Not on file    Active member of club or organization: Not on file    Attends meetings of clubs or organizations: Not on file    Relationship status: Not  on file  . Intimate partner violence:    Fear of current or ex partner: Not on file    Emotionally abused: Not on file    Physically abused: Not on file    Forced sexual activity: Not on file  Other Topics Concern  . Not on file  Social History Narrative  . Not on file    Review of Systems  Constitution: Negative for chills, decreased appetite, malaise/fatigue and weight gain.  Cardiovascular: Positive for dyspnea on exertion and leg swelling (chronic and mild). Negative for syncope.  Endocrine: Negative for cold intolerance.  Hematologic/Lymphatic: Does not bruise/bleed easily.  Musculoskeletal: Positive for muscle weakness (left arm and left leg since stroke). Negative for falls and joint swelling.  Gastrointestinal: Negative for abdominal pain,  anorexia and change in bowel habit.  Neurological: Positive for focal weakness (left arm and leg weakness), light-headedness and loss of balance. Negative for headaches and seizures.  Psychiatric/Behavioral: Negative for depression and substance abuse.  All other systems reviewed and are negative.     Objective  Blood pressure (!) 141/65, pulse 69, temperature 97.6 F (36.4 C), height 6' (1.829 m), weight 174 lb 4.8 oz (79.1 kg), SpO2 99 %. Body mass index is 23.64 kg/m.    1108  09/04/18 1124  09/04/18 1258  09/04/18 1300  09/04/18 1301                Vital Signs  BP 158/70 141/65 150/70 144/68 148/70  Pulse Rate   69        BP Location Right Arm Right Arm Left Arm Left Arm Left Arm  BP Method     Automatic Automatic Automatic  Cuff Size Normal Large Large Large Large  Patient Position (if appropriate)     Lying Sitting Standing   Physical Exam  Constitutional: She appears well-nourished. No distress.  Moderately built  HENT:  Head: Atraumatic.  Eyes: Conjunctivae are normal.  Neck: Neck supple. No JVD present. No thyromegaly present.  Cardiovascular: Normal rate, regular rhythm, normal heart sounds and intact distal pulses.  Occasional extrasystoles are present. Exam reveals no gallop.  No murmur heard. Pulmonary/Chest: Effort normal and breath sounds normal.  Abdominal: Soft. Bowel sounds are normal.  Musculoskeletal: Normal range of motion.        General: Edema (1-2 plus ankle pitting edema) present.  Neurological: She is alert.  Grossly left arm weakness and mild contracture noted, left lower extremity weakness present.  Skin: Skin is warm and dry.  Psychiatric: She has a normal mood and affect.   Radiology: No results found.  Laboratory examination:    CMP Latest Ref Rng & Units 03/30/2018 10/10/2017 07/17/2017  Glucose 65 - 99 mg/dL 158(H) 385(H) 267(H)  BUN 8 - 27 mg/dL 13 13 11   Creatinine 0.57 - 1.00 mg/dL 0.74 0.76 0.69  Sodium 134 - 144 mmol/L 142  137 139  Potassium 3.5 - 5.2 mmol/L 4.0 4.1 3.8  Chloride 96 - 106 mmol/L 103 100 103  CO2 20 - 29 mmol/L 23 31 29   Calcium 8.7 - 10.3 mg/dL 9.6 9.6 9.5  Total Protein 6.0 - 8.5 g/dL 6.4 - 7.1  Total Bilirubin 0.0 - 1.2 mg/dL 0.5 - 0.5  Alkaline Phos 39 - 117 IU/L 75 - 83  AST 0 - 40 IU/L 12 - 14  ALT 0 - 32 IU/L 12 - 12   CBC Latest Ref Rng & Units 03/30/2018 10/10/2017 07/17/2017  WBC 3.4 - 10.8 x10E3/uL 4.9 5.3 5.4  Hemoglobin 11.1 - 15.9 g/dL 10.4(L) 12.2 11.5(L)  Hematocrit 34.0 - 46.6 % 34.0 37.8 36.1  Platelets 150 - 450 x10E3/uL 307 265.0 266.0   Lipid Panel     Component Value Date/Time   CHOL 137 03/30/2018 1201   TRIG 80 03/30/2018 1201   HDL 49 03/30/2018 1201   CHOLHDL 2.8 03/30/2018 1201   CHOLHDL 3 07/17/2017 1227   VLDL 21.6 07/17/2017 1227   LDLCALC 72 03/30/2018 1201   HEMOGLOBIN A1C Lab Results  Component Value Date   HGBA1C 7.2 (A) 08/20/2018   TSH Recent Labs    03/30/18 1201  TSH 2.420    PRN Meds:. Medications Discontinued During This Encounter  Medication Reason  . Calcium Carbonate-Vitamin D (CALCIUM 600 + D PO) Patient Preference   Current Meds  Medication Sig  . aspirin 81 MG tablet Take 81 mg by mouth daily.    Marland Kitchen atorvastatin (LIPITOR) 10 MG tablet TAKE 1 TABLET(10 MG) BY MOUTH DAILY  . Cholecalciferol 2000 units TABS Take 1 tablet (2,000 Units total) by mouth daily.  . hydrochlorothiazide (HYDRODIURIL) 25 MG tablet Take 25 mg by mouth daily.  Marland Kitchen olmesartan-hydrochlorothiazide (BENICAR HCT) 20-12.5 MG tablet TAKE 1 TABLET BY MOUTH DAILY  . sitaGLIPtin (JANUVIA) 50 MG tablet Take 1 tablet (50 mg total) by mouth daily.    Cardiac Studies:   Echocardiogram 08/28/2018: Left ventricle cavity is normal in size. Moderate concentric hypertrophy of the left ventricle. Normal global wall motion. Doppler evidence of grade II (pseudonormal) diastolic dysfunction, elevated LAP. Calculated EF 53%. Mild (Grade I) mitral regurgitation. Mild  tricuspid regurgitation. Estimated pulmonary artery systolic pressure 26 mmHg. Mild pulmonic regurgitation. Compared to 10/09/16, previously mild LVH.   Event monitor 30 days 0/10/2018: Predominant rhythm is normal sinus rhythm.  There were 3 runs of NSVT, maximum 13 beats, minimum 3 beats, asymptomatic.  Frequent PVCs, ventricular ectopic burden 5%.  There are occasional PACs.  Holter Monitor for 24 hours  10/09/2016:  Normal sinus rhythm, sinus bradycardia and sinus tachycardia with heart rate ranging from 45-121bpm with average heart rate 69bpm.  Frequent PVCs, bigeminal PVCs, salvos and nonsustained ventricular tachycardia up to 5 beats in a ros  PVC load was 20%.  Occasional PACs and nonsustained atrial tachycardia up to 7 beats in a row.  Lexiscan sestamibi stress test 09/29/16: Non-diagnostic EKG, medium-sized mild defect in the inferior lateral, mid anterior and mid inferolateral and apical anterior location which is not reversible with normal LV systolic function at 27%.  Low risk study and defect represents attenuation artifact.  No significant change from 2016.  Assessment   Dizziness  H/O ischemic right PCA stroke - right pons 2001: Residual left hemiparesis  Dyspnea on exertion  Frequent PVCs  EKG 05/31/2018: Sinus rhythm with first-degree AV block at rate of 56 bpm, left axis deviation, left anterior fascicular block.  Cannot exclude inferior infarct old, anteroseptal infarct old.  No evidence of ischemia.  EKG 09/19/2016: Normal sinus rhythm at the rate of 85 bpm, left axis deviation, poor R-wave progression, frequent PVCs in the form of ventricular bigeminy, ventricular couplets and triplets.  Recommendations:   Patient initially referred to me for evaluation of dizziness and marked dyspnea on exertion, although she has had a negative nuclear stress test about 3 years ago and echocardiogram did not reveal any significant abnormality, she needs repeat testing in view of  NSVT, uncontrolled diabetes, hyperlipidemia and hypertension and worsening dyspnea. Her activity is now limited to minimal and has limitations  For ADL.  Deconditioning is also possible.    Would recommend neurologic evaluation for dizziness especially in view of prior brain stem stroke. Due to marked fatigue and daytime somnolence, central sleep apnea may also be need to be excluded, we will obtain nocturnal oximetry. She is negative for orthostatics change today.  With regard to dizziness, differential diagnosis includes sick sinus syndrome, AV nodal disease.  She has underlying first-degree AV block and left anterior fascicular block/bifascicular block. Event monitor for 30 days except for NSVT which she was asymptomatic, did not reveal A. Fib or heart block.    She is now tolerating Benicar HCT blood pressure is much improved. Unless the stress test obtain nocturnal oximetry is markedly abnormal, I'll see her back in 3 months for follow-up.  Adrian Prows, MD, [facility] 09/04/2018, 12:26 PM [facility]. [place] Pager: [phone] Office: [phone] If no answer Cell [phone]

## 2018-09-07 ENCOUNTER — Encounter: Payer: Self-pay | Admitting: Cardiology

## 2018-09-12 ENCOUNTER — Telehealth: Payer: Self-pay

## 2018-09-12 ENCOUNTER — Other Ambulatory Visit: Payer: Self-pay

## 2018-09-12 DIAGNOSIS — I10 Essential (primary) hypertension: Secondary | ICD-10-CM

## 2018-09-12 MED ORDER — SPHYGMOMANOMETER MISC: 1.0000 [IU] | Freq: Every morning | 0 refills | Status: DC

## 2018-09-12 NOTE — Telephone Encounter (Signed)
Pt's daughter called wanting to know if you can call in a prescription for a BP cuff to [facility]. She does not have one. Please advise.//ah

## 2018-09-14 DIAGNOSIS — Z7982 Long term (current) use of aspirin: Secondary | ICD-10-CM | POA: Diagnosis not present

## 2018-09-14 DIAGNOSIS — I69354 Hemiplegia and hemiparesis following cerebral infarction affecting left non-dominant side: Secondary | ICD-10-CM | POA: Diagnosis not present

## 2018-09-14 DIAGNOSIS — Z87891 Personal history of nicotine dependence: Secondary | ICD-10-CM | POA: Diagnosis not present

## 2018-09-14 DIAGNOSIS — Z8249 Family history of ischemic heart disease and other diseases of the circulatory system: Secondary | ICD-10-CM | POA: Diagnosis not present

## 2018-09-14 DIAGNOSIS — Z7984 Long term (current) use of oral hypoglycemic drugs: Secondary | ICD-10-CM | POA: Diagnosis not present

## 2018-09-14 DIAGNOSIS — E785 Hyperlipidemia, unspecified: Secondary | ICD-10-CM | POA: Diagnosis not present

## 2018-09-14 DIAGNOSIS — E119 Type 2 diabetes mellitus without complications: Secondary | ICD-10-CM | POA: Diagnosis not present

## 2018-09-14 DIAGNOSIS — I10 Essential (primary) hypertension: Secondary | ICD-10-CM | POA: Diagnosis not present

## 2018-09-14 DIAGNOSIS — Z833 Family history of diabetes mellitus: Secondary | ICD-10-CM | POA: Diagnosis not present

## 2018-09-14 DIAGNOSIS — I4891 Unspecified atrial fibrillation: Secondary | ICD-10-CM | POA: Diagnosis not present

## 2018-09-17 ENCOUNTER — Other Ambulatory Visit: Payer: Self-pay

## 2018-09-17 DIAGNOSIS — I10 Essential (primary) hypertension: Secondary | ICD-10-CM

## 2018-09-17 MED ORDER — SPHYGMOMANOMETER MISC: 1.0000 [IU] | Freq: Every morning | 0 refills | Status: DC

## 2018-09-23 ENCOUNTER — Other Ambulatory Visit: Payer: Medicare HMO

## 2018-09-30 ENCOUNTER — Encounter: Payer: Self-pay | Admitting: Family Medicine

## 2018-09-30 ENCOUNTER — Ambulatory Visit (INDEPENDENT_AMBULATORY_CARE_PROVIDER_SITE_OTHER): Payer: Medicare HMO | Admitting: Family Medicine

## 2018-09-30 ENCOUNTER — Other Ambulatory Visit: Payer: Self-pay

## 2018-09-30 VITALS — BP 128/79 | HR 90 | Temp 98.3°F | Resp 17 | Ht 72.0 in | Wt 169.2 lb

## 2018-09-30 DIAGNOSIS — E118 Type 2 diabetes mellitus with unspecified complications: Secondary | ICD-10-CM | POA: Diagnosis not present

## 2018-09-30 DIAGNOSIS — I10 Essential (primary) hypertension: Secondary | ICD-10-CM | POA: Diagnosis not present

## 2018-09-30 DIAGNOSIS — R42 Dizziness and giddiness: Secondary | ICD-10-CM

## 2018-09-30 DIAGNOSIS — I493 Ventricular premature depolarization: Secondary | ICD-10-CM | POA: Diagnosis not present

## 2018-09-30 DIAGNOSIS — F418 Other specified anxiety disorders: Secondary | ICD-10-CM

## 2018-09-30 DIAGNOSIS — I69359 Hemiplegia and hemiparesis following cerebral infarction affecting unspecified side: Secondary | ICD-10-CM

## 2018-09-30 DIAGNOSIS — R69 Illness, unspecified: Secondary | ICD-10-CM | POA: Diagnosis not present

## 2018-09-30 NOTE — Patient Instructions (Signed)
° ° ° °  If you have lab work done today you will be contacted with your lab results within the next 2 weeks.  If you have not heard from us then please contact us. The fastest way to get your results is to register for My Chart. ° ° °IF you received an x-ray today, you will receive an invoice from [facility]. Please contact [facility] at [phone] with questions or concerns regarding your invoice.  ° °IF you received labwork today, you will receive an invoice from [facility]. Please contact [facility] at [phone] with questions or concerns regarding your invoice.  ° °Our billing staff will not be able to assist you with questions regarding bills from these companies. ° °You will be contacted with the lab results as soon as they are available. The fastest way to get your results is to activate your My Chart account. Instructions are located on the last page of this paperwork. If you have not heard from us regarding the results in 2 weeks, please contact this office. °  ° ° ° °

## 2018-09-30 NOTE — Progress Notes (Signed)
Established Patient Office Visit  Subjective:  Patient ID: Tonya [name], female    DOB: [birth year]  Age: 78 y.o. MRN: [id number]  CC:  Chief Complaint  Patient presents with  . follow up cardio recommendations    4 week f/u.  per daughter pt has some other concerns to talk about    HPI Tonya [name] presents for   This is a 78yo female with a history of CVA and relatively recent diagnosis of diabetes mellitus Diabetes Mellitus: Patient presents for follow up of diabetes. Symptoms: hyperglycemia. Symptoms have gradually improved. Patient denies hypoglycemia , increase appetite, nausea, paresthesia of the feet, polydipsia and polyuria.  Evaluation to date has been included: hemoglobin A1C.  Home sugars: BGs are high in the morning.  Lab Results  Component Value Date   HGBA1C 7.2 (A) 08/20/2018   Dizziness She has dizziness that seems like the room is moving and she has to hold on to furniture She states that she has been having shortness of breath with exercise She is not seeing double and has no nausea  Hypertension:  Hypertension: Patient here for follow-up of elevated blood pressure. She is not exercising and is adherent to low salt diet.  Blood pressure is well controlled at home. Cardiac symptoms none. Patient denies chest pain, chest pressure/discomfort, dyspnea, exertional chest pressure/discomfort, fatigue, irregular heart beat and lower extremity edema.  Cardiovascular risk factors: advanced age (older than 47 for men, 50 for women), diabetes mellitus and hypertension. Use of agents associated with hypertension: none. History of target organ damage: none.  Patient reports that she checks her bp at home and it is doing better BP Readings from Last 3 Encounters:  09/30/18 128/79  09/04/18 (!) 148/70  09/02/18 (!) 158/70     Past Medical History:  Diagnosis Date  . Cerebrovascular accident ([place])   . Chronic edema    ? venous insufficiency  . COPD (chronic  obstructive pulmonary disease) ([place])    never had a problem breathing  . Diabetes mellitus without complication ([place])   . Enlarged heart    per pt  . Gallstones   . GERD (gastroesophageal reflux disease)   . Hyperlipidemia   . Hypertension     Past Surgical History:  Procedure Laterality Date  . ABDOMINAL HYSTERECTOMY    . CHOLECYSTECTOMY      Family History  Problem Relation Age of Onset  . Cirrhosis Mother   . Heart disease Father   . Cancer Other   . Hypertension Other   . Diabetes Other     Social History   Socioeconomic History  . Marital status: Widowed    Spouse name: Not on file  . Number of children: 5  . Years of education: Not on file  . Highest education level: Not on file  Occupational History  . Not on file  Social Needs  . Financial resource strain: Not on file  . Food insecurity    Worry: Not on file    Inability: Not on file  . Transportation needs    Medical: Not on file    Non-medical: Not on file  Tobacco Use  . Smoking status: Former Smoker    Packs/day: 0.25    Years: 1.00    Pack years: 0.25    Quit date: 07/23/1968    Years since quitting: 50.2  . Smokeless tobacco: Never Used  Substance and Sexual Activity  . Alcohol use: No    Alcohol/week: 0.0 standard drinks  .  Drug use: No  . Sexual activity: Not Currently  Lifestyle  . Physical activity    Days per week: Not on file    Minutes per session: Not on file  . Stress: Not on file  Relationships  . Social connections    Talks on phone: Not on file    Gets together: Not on file    Attends religious service: Not on file    Active member of club or organization: Not on file    Attends meetings of clubs or organizations: Not on file    Relationship status: Not on file  . Intimate partner violence    Fear of current or ex partner: Not on file    Emotionally abused: Not on file    Physically abused: Not on file    Forced sexual activity: Not on file  Other Topics Concern  .  Not on file  Social History Narrative  . Not on file    Outpatient Medications Prior to Visit  Medication Sig Dispense Refill  . aspirin 81 MG tablet Take 81 mg by mouth daily.      . atorvastatin (LIPITOR) 10 MG tablet TAKE 1 TABLET(10 MG) BY MOUTH DAILY 90 tablet 1  . Blood Glucose Monitoring Suppl (ONETOUCH VERIO) w/Device KIT Prick finger 2 times a day to test blood glucose level 1 kit 0  . Blood Pressure Monitoring (SPHYGMOMANOMETER) MISC 1 Units by Does not apply route every morning. 1 each 0  . Cholecalciferol 2000 units TABS Take 1 tablet (2,000 Units total) by mouth daily. 90 tablet 1  . hydrochlorothiazide (HYDRODIURIL) 25 MG tablet Take 25 mg by mouth daily.    . Lancets (ONETOUCH ULTRASOFT) lancets Ultra soft smooth 33 gauge. Prick finger 2 times a day to test blood glucose level (Patient taking differently: One touch verio  smooth 33 gauge. Prick finger 2 times a day to test blood glucose level) 100 each 12  . metoprolol tartrate (LOPRESSOR) 25 MG tablet Take 0.5 tablets (12.5 mg total) by mouth 2 (two) times daily. 60 tablet 0  . olmesartan-hydrochlorothiazide (BENICAR HCT) 20-12.5 MG tablet TAKE 1 TABLET BY MOUTH DAILY 90 tablet 3  . ONETOUCH VERIO test strip 1 each by Other route 3 (three) times daily. USE TO TEST BLOOD SUGAR UP TO QID 100 each 0  . sitaGLIPtin (JANUVIA) 50 MG tablet Take 1 tablet (50 mg total) by mouth daily. 90 tablet 3   No facility-administered medications prior to visit.     Allergies  Allergen Reactions  . Hydrocodone   . Iohexol      Code: HIVES, Desc: PT developed hive and fullness in throat post injection of 125cc's Omni 300, Onset Date: 07242008   . Naproxen   . Propoxyphene N-Acetaminophen     ROS Review of Systems  Review of Systems  Constitutional: Negative for activity change, appetite change, chills and fever.  HENT: Negative for congestion, nosebleeds, trouble swallowing and voice change.   Respiratory: Negative for cough,  shortness of breath and wheezing.   Gastrointestinal: Negative for diarrhea, nausea and vomiting.  Genitourinary: Negative for difficulty urinating, dysuria, flank pain and hematuria.  Musculoskeletal: Negative for back pain, joint swelling and neck pain.  Neurological: see hpi See HPI. All other review of systems negative.     Objective:    Physical Exam  BP 128/79 (BP Location: Right Arm, Patient Position: Sitting, Cuff Size: Large)   Pulse 90   Temp 98.3 F (36.8 C) (Oral)   Resp   17   Ht 6' (1.829 m)   Wt 169 lb 3.2 oz (76.7 kg)   SpO2 100%   BMI 22.95 kg/m  Wt Readings from Last 3 Encounters:  09/30/18 169 lb 3.2 oz (76.7 kg)  09/04/18 174 lb 4.8 oz (79.1 kg)  09/02/18 173 lb 6.4 oz (78.7 kg)   Physical Exam  Constitutional: Oriented to person, place, and time. Appears well-developed and well-nourished.  HENT:  Head: Normocephalic and atraumatic.  Eyes: Conjunctivae and EOM are normal.  Cardiovascular: Normal rate, regular rhythm, normal heart sounds and intact distal pulses.  No murmur heard. Pulmonary/Chest: Effort normal and breath sounds normal. No stridor. No respiratory distress. Has no wheezes.  Neurological: Is alert and oriented to person, place, and time.  Skin: Skin is warm. Capillary refill takes less than 2 seconds.  Psychiatric: Has a normal mood and affect. Behavior is normal. Judgment and thought content normal.    There are no preventive care reminders to display for this patient.  There are no preventive care reminders to display for this patient.  Lab Results  Component Value Date   TSH 2.420 03/30/2018   Lab Results  Component Value Date   WBC 4.9 03/30/2018   HGB 10.4 (L) 03/30/2018   HCT 34.0 03/30/2018   MCV 85 03/30/2018   PLT 307 03/30/2018   Lab Results  Component Value Date   NA 139 09/30/2018   K 4.3 09/30/2018   CO2 23 09/30/2018   GLUCOSE 195 (H) 09/30/2018   BUN 10 09/30/2018   CREATININE 0.63 09/30/2018   BILITOT 0.5  03/30/2018   ALKPHOS 75 03/30/2018   AST 12 03/30/2018   ALT 12 03/30/2018   PROT 6.4 03/30/2018   ALBUMIN 3.9 03/30/2018   CALCIUM 9.2 09/30/2018   GFR 94.31 10/10/2017   Lab Results  Component Value Date   CHOL 137 03/30/2018   Lab Results  Component Value Date   HDL 49 03/30/2018   Lab Results  Component Value Date   LDLCALC 72 03/30/2018   Lab Results  Component Value Date   TRIG 80 03/30/2018   Lab Results  Component Value Date   CHOLHDL 2.8 03/30/2018   Lab Results  Component Value Date   HGBA1C 7.2 (A) 08/20/2018      Assessment & Plan:   Problem List Items Addressed This Visit      Cardiovascular and Mediastinum   Essential hypertension   Frequent PVCs     Endocrine   Type 2 diabetes mellitus with complication, without long-term current use of insulin (HCC) - Primary     Nervous and Auditory   CVA, old, hemiparesis ([place])    Other Visit Diagnoses    Dizziness and giddiness       Anxiety about health          Dizziness:  ddx sick sinus, av node disease ECG showed underlying 1st degree AV block and Left anterior fascicular block/bifascicular block Event Monitor showed NSVT but no A. Fib or heart block.   Cardiology also recommended nocturnal oximetry and evaluation of dizziness should be done with Neurology  Hypertension:  bp improved with benicar hct, cpm  Diabetes Mellitus: fasting glucose elevated  Pt concerned about her fasting glucose elevations She is on januvia 50m and takes 1/2 tablet Advised to call Endocrinology   No orders of the defined types were placed in this encounter.   Follow-up: No follow-ups on file.    ZForrest Moron MD

## 2018-10-01 LAB — BASIC METABOLIC PANEL
BUN/Creatinine Ratio: 16 (ref 12–28)
BUN: 10 mg/dL (ref 8–27)
CO2: 23 mmol/L (ref 20–29)
Calcium: 9.2 mg/dL (ref 8.7–10.3)
Chloride: 103 mmol/L (ref 96–106)
Creatinine, Ser: 0.63 mg/dL (ref 0.57–1.00)
GFR calc Af Amer: 98 mL/min/{1.73_m2} (ref >59)
GFR calc non Af Amer: 85 mL/min/{1.73_m2} (ref >59)
Glucose: 195 mg/dL — ABNORMAL HIGH (ref 65–99)
Potassium: 4.3 mmol/L (ref 3.5–5.2)
Sodium: 139 mmol/L (ref 134–144)

## 2018-10-07 ENCOUNTER — Ambulatory Visit (INDEPENDENT_AMBULATORY_CARE_PROVIDER_SITE_OTHER): Payer: Medicare HMO

## 2018-10-07 ENCOUNTER — Encounter: Payer: Self-pay | Admitting: *Deleted

## 2018-10-07 ENCOUNTER — Other Ambulatory Visit: Payer: Self-pay

## 2018-10-07 DIAGNOSIS — I472 Ventricular tachycardia: Secondary | ICD-10-CM | POA: Diagnosis not present

## 2018-10-07 DIAGNOSIS — I4729 Other ventricular tachycardia: Secondary | ICD-10-CM

## 2018-10-07 DIAGNOSIS — R0609 Other forms of dyspnea: Secondary | ICD-10-CM

## 2018-10-10 DIAGNOSIS — R69 Illness, unspecified: Secondary | ICD-10-CM | POA: Diagnosis not present

## 2018-10-15 ENCOUNTER — Inpatient Hospital Stay (HOSPITAL_COMMUNITY)
Admission: EM | Admit: 2018-10-15 | Discharge: 2018-10-18 | DRG: 375 | Disposition: A | Discharge status: Home Health | Payer: Medicare HMO | Attending: Internal Medicine | Admitting: Internal Medicine

## 2018-10-15 ENCOUNTER — Ambulatory Visit (INDEPENDENT_AMBULATORY_CARE_PROVIDER_SITE_OTHER): Payer: Medicare HMO | Admitting: Family Medicine

## 2018-10-15 ENCOUNTER — Encounter: Payer: Self-pay | Admitting: Family Medicine

## 2018-10-15 ENCOUNTER — Other Ambulatory Visit: Payer: Self-pay

## 2018-10-15 ENCOUNTER — Encounter (HOSPITAL_COMMUNITY): Payer: Self-pay | Admitting: Emergency Medicine

## 2018-10-15 VITALS — BP 122/75 | HR 97 | Temp 98.5°F | Resp 17 | Ht 72.0 in | Wt 168.6 lb

## 2018-10-15 DIAGNOSIS — F430 Acute stress reaction: Secondary | ICD-10-CM

## 2018-10-15 DIAGNOSIS — Z1159 Encounter for screening for other viral diseases: Secondary | ICD-10-CM

## 2018-10-15 DIAGNOSIS — Z888 Allergy status to other drugs, medicaments and biological substances status: Secondary | ICD-10-CM | POA: Diagnosis not present

## 2018-10-15 DIAGNOSIS — I491 Atrial premature depolarization: Secondary | ICD-10-CM | POA: Diagnosis not present

## 2018-10-15 DIAGNOSIS — M48061 Spinal stenosis, lumbar region without neurogenic claudication: Secondary | ICD-10-CM | POA: Diagnosis present

## 2018-10-15 DIAGNOSIS — D649 Anemia, unspecified: Secondary | ICD-10-CM

## 2018-10-15 DIAGNOSIS — R609 Edema, unspecified: Secondary | ICD-10-CM | POA: Diagnosis present

## 2018-10-15 DIAGNOSIS — R001 Bradycardia, unspecified: Secondary | ICD-10-CM | POA: Diagnosis not present

## 2018-10-15 DIAGNOSIS — I639 Cerebral infarction, unspecified: Secondary | ICD-10-CM | POA: Diagnosis not present

## 2018-10-15 DIAGNOSIS — Z886 Allergy status to analgesic agent status: Secondary | ICD-10-CM | POA: Diagnosis not present

## 2018-10-15 DIAGNOSIS — J449 Chronic obstructive pulmonary disease, unspecified: Secondary | ICD-10-CM | POA: Diagnosis present

## 2018-10-15 DIAGNOSIS — Z90710 Acquired absence of both cervix and uterus: Secondary | ICD-10-CM

## 2018-10-15 DIAGNOSIS — I10 Essential (primary) hypertension: Secondary | ICD-10-CM | POA: Diagnosis not present

## 2018-10-15 DIAGNOSIS — K219 Gastro-esophageal reflux disease without esophagitis: Secondary | ICD-10-CM | POA: Diagnosis present

## 2018-10-15 DIAGNOSIS — I69354 Hemiplegia and hemiparesis following cerebral infarction affecting left non-dominant side: Secondary | ICD-10-CM | POA: Diagnosis not present

## 2018-10-15 DIAGNOSIS — R42 Dizziness and giddiness: Secondary | ICD-10-CM

## 2018-10-15 DIAGNOSIS — Z7982 Long term (current) use of aspirin: Secondary | ICD-10-CM

## 2018-10-15 DIAGNOSIS — I69359 Hemiplegia and hemiparesis following cerebral infarction affecting unspecified side: Secondary | ICD-10-CM | POA: Diagnosis not present

## 2018-10-15 DIAGNOSIS — D734 Cyst of spleen: Secondary | ICD-10-CM | POA: Diagnosis present

## 2018-10-15 DIAGNOSIS — C182 Malignant neoplasm of ascending colon: Principal | ICD-10-CM | POA: Diagnosis present

## 2018-10-15 DIAGNOSIS — K648 Other hemorrhoids: Secondary | ICD-10-CM | POA: Diagnosis present

## 2018-10-15 DIAGNOSIS — Z87891 Personal history of nicotine dependence: Secondary | ICD-10-CM | POA: Diagnosis not present

## 2018-10-15 DIAGNOSIS — K621 Rectal polyp: Secondary | ICD-10-CM | POA: Diagnosis not present

## 2018-10-15 DIAGNOSIS — C189 Malignant neoplasm of colon, unspecified: Secondary | ICD-10-CM

## 2018-10-15 DIAGNOSIS — Z79899 Other long term (current) drug therapy: Secondary | ICD-10-CM | POA: Diagnosis not present

## 2018-10-15 DIAGNOSIS — E119 Type 2 diabetes mellitus without complications: Secondary | ICD-10-CM | POA: Diagnosis present

## 2018-10-15 DIAGNOSIS — E785 Hyperlipidemia, unspecified: Secondary | ICD-10-CM | POA: Diagnosis present

## 2018-10-15 DIAGNOSIS — C787 Secondary malignant neoplasm of liver and intrahepatic bile duct: Secondary | ICD-10-CM | POA: Diagnosis present

## 2018-10-15 DIAGNOSIS — R69 Illness, unspecified: Secondary | ICD-10-CM | POA: Diagnosis not present

## 2018-10-15 DIAGNOSIS — Z885 Allergy status to narcotic agent status: Secondary | ICD-10-CM | POA: Diagnosis not present

## 2018-10-15 DIAGNOSIS — D500 Iron deficiency anemia secondary to blood loss (chronic): Secondary | ICD-10-CM | POA: Diagnosis not present

## 2018-10-15 DIAGNOSIS — D509 Iron deficiency anemia, unspecified: Secondary | ICD-10-CM | POA: Diagnosis not present

## 2018-10-15 DIAGNOSIS — Z9049 Acquired absence of other specified parts of digestive tract: Secondary | ICD-10-CM | POA: Diagnosis not present

## 2018-10-15 DIAGNOSIS — R002 Palpitations: Secondary | ICD-10-CM | POA: Diagnosis not present

## 2018-10-15 DIAGNOSIS — D128 Benign neoplasm of rectum: Secondary | ICD-10-CM | POA: Diagnosis not present

## 2018-10-15 DIAGNOSIS — K6389 Other specified diseases of intestine: Secondary | ICD-10-CM | POA: Diagnosis not present

## 2018-10-15 DIAGNOSIS — Z8249 Family history of ischemic heart disease and other diseases of the circulatory system: Secondary | ICD-10-CM

## 2018-10-15 DIAGNOSIS — D120 Benign neoplasm of cecum: Secondary | ICD-10-CM | POA: Diagnosis not present

## 2018-10-15 DIAGNOSIS — R0600 Dyspnea, unspecified: Secondary | ICD-10-CM | POA: Diagnosis not present

## 2018-10-15 DIAGNOSIS — R935 Abnormal findings on diagnostic imaging of other abdominal regions, including retroperitoneum: Secondary | ICD-10-CM | POA: Diagnosis not present

## 2018-10-15 DIAGNOSIS — R160 Hepatomegaly, not elsewhere classified: Secondary | ICD-10-CM | POA: Diagnosis not present

## 2018-10-15 DIAGNOSIS — Z833 Family history of diabetes mellitus: Secondary | ICD-10-CM

## 2018-10-15 LAB — URINALYSIS, ROUTINE W REFLEX MICROSCOPIC
Bilirubin Urine: NEGATIVE
Glucose, UA: NEGATIVE mg/dL
Hgb urine dipstick: NEGATIVE
Ketones, ur: 5 mg/dL — AB
Leukocytes,Ua: NEGATIVE
Nitrite: NEGATIVE
Protein, ur: NEGATIVE mg/dL
Specific Gravity, Urine: 1.01 (ref 1.005–1.030)
pH: 8 (ref 5.0–8.0)

## 2018-10-15 LAB — POCT CBC
Granulocyte percent: 70.9 %G (ref 37–80)
HCT, POC: 22.7 % — AB (ref 29–41)
Hemoglobin: 6.6 g/dL — AB (ref 11–14.6)
Lymph, poc: 1.5 (ref 0.6–3.4)
MCH, POC: 17 pg — AB (ref 27–31.2)
MCHC: 29.1 g/dL — AB (ref 31.8–35.4)
MCV: 58.2 fL — AB (ref 76–111)
MID (cbc): 0.5 (ref 0–0.9)
MPV: 8.7 fL (ref 0–99.8)
POC Granulocyte: 4.8 (ref 2–6.9)
POC LYMPH PERCENT: 22.2 %L (ref 10–50)
POC MID %: 6.9 %M (ref 0–12)
Platelet Count, POC: 362 10*3/uL (ref 142–424)
RBC: 3.9 M/uL — AB (ref 4.04–5.48)
RDW, POC: 18.9 %
WBC: 6.8 10*3/uL (ref 4.6–10.2)

## 2018-10-15 LAB — CBC
HCT: 24.6 % — ABNORMAL LOW (ref 36.0–46.0)
Hemoglobin: 6.4 g/dL — CL (ref 12.0–15.0)
MCH: 16.2 pg — ABNORMAL LOW (ref 26.0–34.0)
MCHC: 26 g/dL — ABNORMAL LOW (ref 30.0–36.0)
MCV: 62.3 fL — ABNORMAL LOW (ref 80.0–100.0)
Platelets: 328 10*3/uL (ref 150–400)
RBC: 3.95 MIL/uL (ref 3.87–5.11)
RDW: 18.9 % — ABNORMAL HIGH (ref 11.5–15.5)
WBC: 7.6 10*3/uL (ref 4.0–10.5)
nRBC: 0 % (ref 0.0–0.2)

## 2018-10-15 LAB — COMPREHENSIVE METABOLIC PANEL WITH GFR
ALT: 13 U/L (ref 0–44)
AST: 19 U/L (ref 15–41)
Albumin: 3 g/dL — ABNORMAL LOW (ref 3.5–5.0)
Alkaline Phosphatase: 82 U/L (ref 38–126)
Anion gap: 8 (ref 5–15)
BUN: 15 mg/dL (ref 8–23)
CO2: 25 mmol/L (ref 22–32)
Calcium: 9.1 mg/dL (ref 8.9–10.3)
Chloride: 107 mmol/L (ref 98–111)
Creatinine, Ser: 0.75 mg/dL (ref 0.44–1.00)
GFR calc Af Amer: >60 mL/min
GFR calc non Af Amer: >60 mL/min
Glucose, Bld: 168 mg/dL — ABNORMAL HIGH (ref 70–99)
Potassium: 3.9 mmol/L (ref 3.5–5.1)
Sodium: 140 mmol/L (ref 135–145)
Total Bilirubin: 0.5 mg/dL (ref 0.3–1.2)
Total Protein: 6.2 g/dL — ABNORMAL LOW (ref 6.5–8.1)

## 2018-10-15 LAB — PREPARE RBC (CROSSMATCH)

## 2018-10-15 LAB — POC OCCULT BLOOD, ED: Fecal Occult Bld: NEGATIVE

## 2018-10-15 LAB — GLUCOSE, POCT (MANUAL RESULT ENTRY): POC Glucose: 216 mg/dl — AB (ref 70–99)

## 2018-10-15 LAB — ABO/RH: ABO/RH(D): A POS

## 2018-10-15 MED ORDER — ACETAMINOPHEN 650 MG RE SUPP: 650.0000 mg | Freq: Four times a day (QID) | RECTAL | Status: DC | PRN

## 2018-10-15 MED ORDER — SODIUM CHLORIDE 0.9% FLUSH
3.0000 mL | Freq: Two times a day (BID) | INTRAVENOUS | Status: DC
  Administered 2018-10-15 – 2018-10-18 (×3): 3 mL via INTRAVENOUS

## 2018-10-15 MED ORDER — ACETAMINOPHEN 325 MG PO TABS
650.0000 mg | ORAL_TABLET | Freq: Four times a day (QID) | ORAL | Status: DC | PRN
  Filled 2018-10-15: qty 2

## 2018-10-15 MED ORDER — LINAGLIPTIN 5 MG PO TABS
5.0000 mg | ORAL_TABLET | Freq: Every day | ORAL | Status: DC
  Administered 2018-10-16: 5 mg via ORAL
  Filled 2018-10-15: qty 1

## 2018-10-15 MED ORDER — HYDROCHLOROTHIAZIDE 25 MG PO TABS
25.0000 mg | ORAL_TABLET | Freq: Every day | ORAL | Status: DC
  Administered 2018-10-15 – 2018-10-18 (×4): 25 mg via ORAL
  Filled 2018-10-15 (×4): qty 1

## 2018-10-15 MED ORDER — SODIUM CHLORIDE 0.9 % IV SOLN
INTRAVENOUS | Status: DC
  Administered 2018-10-15: 21:00:00 via INTRAVENOUS

## 2018-10-15 MED ORDER — ONDANSETRON HCL 4 MG/2ML IJ SOLN: 4.0000 mg | Freq: Four times a day (QID) | INTRAMUSCULAR | Status: DC | PRN

## 2018-10-15 MED ORDER — ONDANSETRON HCL 4 MG PO TABS: 4.0000 mg | ORAL_TABLET | Freq: Four times a day (QID) | ORAL | Status: DC | PRN

## 2018-10-15 MED ORDER — SODIUM CHLORIDE 0.9 % IV SOLN: 250.0000 mL | INTRAVENOUS | Status: DC | PRN

## 2018-10-15 MED ORDER — SODIUM CHLORIDE 0.9% IV SOLUTION
Freq: Once | INTRAVENOUS | Status: AC
  Administered 2018-10-15: 15:00:00 via INTRAVENOUS

## 2018-10-15 MED ORDER — SODIUM CHLORIDE 0.9% FLUSH: 3.0000 mL | INTRAVENOUS | Status: DC | PRN

## 2018-10-15 MED ORDER — ATORVASTATIN CALCIUM 10 MG PO TABS
10.0000 mg | ORAL_TABLET | Freq: Every day | ORAL | Status: DC
  Administered 2018-10-15 – 2018-10-18 (×4): 10 mg via ORAL
  Filled 2018-10-15 (×4): qty 1

## 2018-10-15 MED ORDER — METOPROLOL TARTRATE 12.5 MG HALF TABLET
12.5000 mg | ORAL_TABLET | Freq: Two times a day (BID) | ORAL | Status: DC
  Administered 2018-10-15 – 2018-10-18 (×6): 12.5 mg via ORAL
  Filled 2018-10-15 (×7): qty 1

## 2018-10-15 NOTE — H&P (Signed)
[facility]                                                                                    Patient Demographics  Tonya [name], is a 78 y.o. female  CSN: [id number]  MRN: [id number]  DOB - 30-Jul-1940  Admit Date - 10/15/2018  Outpatient Primary MD for the patient is Forrest Moron, MD   With History of -  Past Medical History:  Diagnosis Date  . Cerebrovascular accident ([place])   . Chronic edema    ? venous insufficiency  . COPD (chronic obstructive pulmonary disease) ([place])    never had a problem breathing  . Diabetes mellitus without complication ([place])   . Enlarged heart    per pt  . Gallstones   . GERD (gastroesophageal reflux disease)   . Hyperlipidemia   . Hypertension       Past Surgical History:  Procedure Laterality Date  . ABDOMINAL HYSTERECTOMY    . CHOLECYSTECTOMY      in for   Chief Complaint  Patient presents with  . Abnormal Lab     HPI  Tonya [name]  is a 78 y.o. female, with past medical history significant for hypertension, hyperlipidemia, newly diagnosed diabetes mellitus and old CVA presenting today with increased weakness for the last 1 month with no abdominal pain, nausea vomiting, fever or chills. Patient denies any history of anemia.  She had her colonoscopy around 6 years ago which was negative.  Patient denies any headaches dizziness or loss of consciousness . Patient went to see her primary medical doctor and she was noted to be anemic with a hemoglobin of 6.6. In the emergency room the patient was noted to have a hemoglobin of 6.4 with a creatinine of 0.75 patient is on chronic aspirin 81 mg p.o. daily    Review of Systems    In addition to the HPI above,  No Fever-chills, No Headache, No changes with Vision or hearing, No problems swallowing food or Liquids, No Chest pain, Cough or Shortness of Breath, No Abdominal pain, No Nausea or Vommitting, Bowel movements are regular, No Blood in stool or Urine, No  dysuria, No new skin rashes or bruises, No new joints pains-aches,  No recent weight gain or loss, No polyuria, polydypsia or polyphagia, No significant Mental Stressors.  A full 10 point Review of Systems was done, except as stated above, all other Review of Systems were negative.   Social History Social History   Tobacco Use  . Smoking status: Former Smoker    Packs/day: 0.25    Years: 1.00    Pack years: 0.25    Quit date: 07/23/1968    Years since quitting: 50.2  . Smokeless tobacco: Never Used  Substance Use Topics  . Alcohol use: No    Alcohol/week: 0.0 standard drinks     Family History Family History  Problem Relation Age of Onset  . Cirrhosis Mother   . Heart disease Father   . Cancer Other   . Hypertension Other   . Diabetes Other      Prior to Admission medications   Medication Sig Start Date End Date Taking?  Authorizing Provider  metoprolol tartrate (LOPRESSOR) 25 MG tablet Take 0.5 tablets (12.5 mg total) by mouth 2 (two) times daily. 09/02/18  Yes Delia Chimes A, MD  aspirin 81 MG tablet Take 81 mg by mouth daily.      [provider]  atorvastatin (LIPITOR) 10 MG tablet TAKE 1 TABLET(10 MG) BY MOUTH DAILY 06/26/18   Delia Chimes A, MD  Blood Glucose Monitoring Suppl (ONETOUCH VERIO) w/Device KIT Prick finger 2 times a day to test blood glucose level 06/26/18   Forrest Moron, MD  Blood Pressure Monitoring [facility]) MISC 1 Units by Does not apply route every morning. 09/17/18   Adrian Prows, MD  Cholecalciferol 2000 units TABS Take 1 tablet (2,000 Units total) by mouth daily. 07/17/17   Janith Lima, MD  hydrochlorothiazide (HYDRODIURIL) 25 MG tablet Take 25 mg by mouth daily.    [provider]  Lancets (ONETOUCH ULTRASOFT) lancets Ultra soft smooth 33 gauge. Prick finger 2 times a day to test blood glucose level Patient taking differently: 1 each by Other route 2 (two) times a day. One Touch Verio smooth 33 gauge 08/07/18    Forrest Moron, MD  olmesartan-hydrochlorothiazide (BENICAR HCT) 20-12.5 MG tablet TAKE 1 TABLET BY MOUTH DAILY 07/24/18   Adrian Prows, MD  [facility] VERIO test strip 1 each by Other route 3 (three) times daily. USE TO TEST BLOOD SUGAR UP TO QID 05/24/18   Sagardia, Ines Bloomer, MD  sitaGLIPtin (JANUVIA) 50 MG tablet Take 1 tablet (50 mg total) by mouth daily. 08/26/18   Renato Shin, MD    Allergies  Allergen Reactions  . Hydrocodone   . Iohexol      Code: HIVES, Desc: PT developed hive and fullness in throat post injection of 125cc's Omni 300, Onset Date: [id number]   . Naproxen   . Propoxyphene N-Acetaminophen     Physical Exam  Vitals  Blood pressure (!) 185/78, pulse 70, temperature 98.7 F (37.1 C), temperature source Oral, resp. rate 20, height '5\' 11"'  (1.803 m), weight 76.2 kg, SpO2 100 %.   General appearance in no acute distress, looks younger than age 79 no jaundice, mild pallor , no facial deviation Neck supple, no neck vein distention Heart normal S1-S2, no murmurs gallops or rubs Chest decreased breath sounds at the bases Abdomen soft, nontender, bowel sounds are present Extremities no clubbing cyanosis +1 edema lower extremities Skin no rashes or ulcers Neuro nonfocal , patient moving all extremities Skin no rashes or ulcers  Data Review  CBC Recent Labs  Lab 10/15/18 1212 10/15/18 1225  WBC 6.8 7.6  HGB 6.6* 6.4*  HCT 22.7* 24.6*  PLT  --  328  MCV 58.2* 62.3*  MCH 17.0* 16.2*  MCHC 29.1* 26.0*  RDW  --  18.9*   ------------------------------------------------------------------------------------------------------------------  Chemistries  Recent Labs  Lab 10/15/18 1225  NA 140  K 3.9  CL 107  CO2 25  GLUCOSE 168*  BUN 15  CREATININE 0.75  CALCIUM 9.1  AST 19  ALT 13  ALKPHOS 82  BILITOT 0.5   ------------------------------------------------------------------------------------------------------------------ estimated creatinine  clearance is 62.7 mL/min (by C-G formula based on SCr of 0.75 mg/dL). ------------------------------------------------------------------------------------------------------------------ No results for input(s): TSH, T4TOTAL, T3FREE, THYROIDAB in the last 72 hours.  Invalid input(s): FREET3   Coagulation profile No results for input(s): INR, PROTIME in the last 168 hours. ------------------------------------------------------------------------------------------------------------------- No results for input(s): DDIMER in the last 72 hours. -------------------------------------------------------------------------------------------------------------------  Cardiac Enzymes No results for input(s): CKMB, TROPONINI, MYOGLOBIN  in the last 168 hours.  Invalid input(s): CK ------------------------------------------------------------------------------------------------------------------ Invalid input(s): POCBNP   ---------------------------------------------------------------------------------------------------------------  Urinalysis    Component Value Date/Time   COLORURINE YELLOW 10/10/2017 1216   APPEARANCEUR Clear 10/10/2017 1216   LABSPEC 1.010 10/10/2017 1216   PHURINE 7.0 10/10/2017 1216   GLUCOSEU >=1000 (A) 10/10/2017 1216   HGBUR NEGATIVE 10/10/2017 1216   BILIRUBINUR negative (A) 05/31/2018 1712   KETONESUR negative (A) 05/31/2018 1712   KETONESUR NEGATIVE 10/10/2017 1216   PROTEINUR trace (A) 05/31/2018 1712   UROBILINOGEN 1.0 05/31/2018 1712   UROBILINOGEN 1.0 10/10/2017 1216   NITRITE Negative 05/31/2018 1712   NITRITE NEGATIVE 10/10/2017 1216   LEUKOCYTESUR Negative 05/31/2018 1712    ----------------------------------------------------------------------------------------------------------------  Imaging results:     Assessment & Plan  Anemia On chronic aspirin 81 mg p.o. daily Continue with Protonix Discussed with gastroenterology who will see her in  outpatient setting since she refused COVID test , so no procedure will be done for her.  Explained to daughter Check CT of abdomen  History of CVA Aspirin on hold  Diabetes mellitus ISS  Hypertension Continue with Lopressor  Hyperlipidemia Continue with Lipitor   DVT Prophylaxis SCDs  AM Labs Ordered, also please review Full Orders  Family Communication: Discussed with daughter [name]  Code Status full  Disposition Plan: Home  Time spent in minutes : 35 minutes  Condition GUARDED   '@SIGNATURE' @

## 2018-10-15 NOTE — ED Notes (Signed)
Pt dtr called again as I was trying to obtain an IV site for this pt. IV consult placed. The PA came into the room and I asked her to speak with dtr. Pt is calm and A&O.

## 2018-10-15 NOTE — ED Notes (Signed)
Family updated about placement.

## 2018-10-15 NOTE — Progress Notes (Signed)
RN returned call back in attempt to receive report. RN busy and will return call back.

## 2018-10-15 NOTE — ED Notes (Signed)
Pt dtr called and wanted to speak to the doctor with her concerns. I have provided EDP and PA with her number. Currently the dtr is speaking to the pt.

## 2018-10-15 NOTE — ED Notes (Signed)
Pt family has declined that pt be tested for covid on the phone with me and the PA. IV team is at bedside.

## 2018-10-15 NOTE — ED Notes (Signed)
Assisted pt in calling her dtr to update.

## 2018-10-15 NOTE — ED Notes (Signed)
Admitting provider at bedside.

## 2018-10-15 NOTE — ED Notes (Signed)
ED TO INPATIENT HANDOFF REPORT  ED Nurse Name and Phone #: [phone]  S Name/Age/Gender Tonya [name] 78 y.o. female Room/Bed: 054C/054C  Code Status   Code Status: Full Code  Home/SNF/Other Home Patient oriented to: self, place, time and situation Is this baseline? Yes   Triage Complete: Triage complete  Chief Complaint Dizziness  Triage Note Sent from PCP for hgb drop from 10-6 over the last 2-3 months- pt denies any bloody or dark stools.    Allergies Allergies  Allergen Reactions  . Iohexol Hives, Shortness Of Breath and Other (See Comments)    Patient developed hives and fullness in throat post injection of 125cc's Omni 300, Onset Date: 11/15/2006   . Hydrocodone Swelling and Other (See Comments)    "I started swelling, became red, and passed out"  . Ibuprofen Nausea Only and Other (See Comments)    "It made my stomach hurt"  . Naproxen Other (See Comments)    "It made me feel out of my head"  . Propoxyphene N-Acetaminophen Other (See Comments)    "I couldn't find the door to make my way out of the room- I was in misery"    Level of Care/Admitting Diagnosis ED Disposition    ED Disposition Condition [place]: Learned [100100]  Level of Care: Med-Surg [16]  Covid Evaluation: Person Under Investigation (PUI)  Isolation Risk Level: Low Risk/Droplet (Less than 4L [place] supplementation)  Diagnosis: Symptomatic anemia [0938182]  Admitting Physician: Merton Border Marshal.Browner  Attending Physician: Laren Everts, ALI Marshal.Browner  Estimated length of stay: past midnight tomorrow  Certification:: I certify this patient will need inpatient services for at least 2 midnights  PT Class (Do Not Modify): Inpatient [101]  PT Acc Code (Do Not Modify): Private [1]       B Medical/Surgery History Past Medical History:  Diagnosis Date  . Cerebrovascular accident ([place])   . Chronic edema    ? venous insufficiency  . COPD (chronic obstructive  pulmonary disease) ([place])    never had a problem breathing  . Diabetes mellitus without complication ([place])   . Enlarged heart    per pt  . Gallstones   . GERD (gastroesophageal reflux disease)   . Hyperlipidemia   . Hypertension    Past Surgical History:  Procedure Laterality Date  . ABDOMINAL HYSTERECTOMY    . CHOLECYSTECTOMY       A IV Location/Drains/Wounds Patient Lines/Drains/Airways Status   Active Line/Drains/Airways    Name:   Placement date:   Placement time:   Site:   Days:   Peripheral IV 10/15/18 Right;Anterior Forearm   10/15/18    1522    Forearm   less than 1          Intake/Output Last 24 hours No intake or output data in the 24 hours ending 10/15/18 1929  Labs/Imaging Results for orders placed or performed during the hospital encounter of 10/15/18 (from the past 48 hour(s))  Comprehensive metabolic panel     Status: Abnormal   Collection Time: 10/15/18 12:25 PM  Result Value Ref Range   Sodium 140 135 - 145 mmol/L   Potassium 3.9 3.5 - 5.1 mmol/L   Chloride 107 98 - 111 mmol/L   CO2 25 22 - 32 mmol/L   Glucose, Bld 168 (H) 70 - 99 mg/dL   BUN 15 8 - 23 mg/dL   Creatinine, Ser 0.75 0.44 - 1.00 mg/dL   Calcium 9.1 8.9 - 10.3 mg/dL  Total Protein 6.2 (L) 6.5 - 8.1 g/dL   Albumin 3.0 (L) 3.5 - 5.0 g/dL   AST 19 15 - 41 U/L   ALT 13 0 - 44 U/L   Alkaline Phosphatase 82 38 - 126 U/L   Total Bilirubin 0.5 0.3 - 1.2 mg/dL   GFR calc non Af Amer >60 >60 mL/min   GFR calc Af Amer >60 >60 mL/min   Anion gap 8 5 - 15    Comment: Performed at [facility] [address], [place], [place] [address]  CBC     Status: Abnormal   Collection Time: 10/15/18 12:25 PM  Result Value Ref Range   WBC 7.6 4.0 - 10.5 K/uL   RBC 3.95 3.87 - 5.11 MIL/uL   Hemoglobin 6.4 (LL) 12.0 - 15.0 g/dL    Comment: REPEATED TO VERIFY Reticulocyte Hemoglobin testing may be clinically indicated, consider ordering this additional test [id number] THIS CRITICAL RESULT HAS  VERIFIED AND BEEN CALLED TO ELIZABETH TAYLOR RN BY ALLISON BENNETT ON 06 23 2020 AT 1415, AND HAS BEEN READ BACK.     HCT 24.6 (L) 36.0 - 46.0 %   MCV 62.3 (L) 80.0 - 100.0 fL   MCH 16.2 (L) 26.0 - 34.0 pg   MCHC 26.0 (L) 30.0 - 36.0 g/dL   RDW 18.9 (H) 11.5 - 15.5 %   Platelets 328 150 - 400 K/uL   nRBC 0.0 0.0 - 0.2 %    Comment: Performed at [facility] [address] [place], [place] [address]  Type and screen [place]     Status: None (Preliminary result)   Collection Time: 10/15/18 12:25 PM  Result Value Ref Range   ABO/RH(D) A POS    Antibody Screen NEG    Sample Expiration 10/18/2018,2359    Unit Number [id number]    Blood Component Type RED CELLS,LR    Unit division 00    Status of Unit ISSUED    Transfusion Status OK TO TRANSFUSE    Crossmatch Result      Compatible Performed at [facility], [place] [address] [place], [place] [address]    Unit Number [id number]    Blood Component Type RED CELLS,LR    Unit division 00    Status of Unit ALLOCATED    Transfusion Status OK TO TRANSFUSE    Crossmatch Result Compatible   Prepare RBC     Status: None   Collection Time: 10/15/18 12:25 PM  Result Value Ref Range   Order Confirmation      ORDER PROCESSED BY BLOOD BANK Performed at [facility], [place] [address], [place], [place] [address]   ABO/Rh     Status: None   Collection Time: 10/15/18 12:25 PM  Result Value Ref Range   ABO/RH(D)      A POS Performed at [facility] [address] [place], [place] [address]   POC occult blood, ED     Status: None   Collection Time: 10/15/18  2:19 PM  Result Value Ref Range   Fecal Occult Bld NEGATIVE NEGATIVE   No results found.  Pending Labs Unresulted Labs (From admission, onward)    Start     Ordered   10/16/18 0500  CBC  Tomorrow morning,   R     10/15/18 1744   10/16/18 6381  Basic metabolic panel  Tomorrow morning,   R     10/15/18 1744   10/15/18  1454   Urinalysis, Routine w reflex microscopic  ONCE - STAT,   STAT     10/15/18 1453   10/15/18 1454  Urine culture  ONCE - STAT,   STAT     10/15/18 1453   10/15/18 1427  Protime-INR  Once,   STAT    Comments: PRE TRANSFUSION    10/15/18 1426          Vitals/Pain Today's Vitals   10/15/18 1745 10/15/18 1830 10/15/18 1845 10/15/18 1858  BP:  (!) 167/79 (!) 182/78   Pulse:  66 65   Resp:  10 11   Temp:      TempSrc:      SpO2: 100% 100% 98%   Weight:      Height:      PainSc:    0-No pain    Isolation Precautions Droplet and Contact precautions  Medications Medications  atorvastatin (LIPITOR) tablet 10 mg (has no administration in time range)  hydrochlorothiazide (HYDRODIURIL) tablet 25 mg (has no administration in time range)  metoprolol tartrate (LOPRESSOR) tablet 12.5 mg (has no administration in time range)  linagliptin (TRADJENTA) tablet 5 mg (has no administration in time range)  sodium chloride flush (NS) 0.9 % injection 3 mL (has no administration in time range)  sodium chloride flush (NS) 0.9 % injection 3 mL (has no administration in time range)  0.9 %  sodium chloride infusion (has no administration in time range)  0.9 %  sodium chloride infusion (has no administration in time range)  ondansetron (ZOFRAN) tablet 4 mg (has no administration in time range)    Or  ondansetron (ZOFRAN) injection 4 mg (has no administration in time range)  acetaminophen (TYLENOL) tablet 650 mg (has no administration in time range)    Or  acetaminophen (TYLENOL) suppository 650 mg (has no administration in time range)  0.9 %  sodium chloride infusion (Manually program via Guardrails IV Fluids) ( Intravenous New Bag/Given 10/15/18 1522)    Mobility non-ambulatory Low fall risk   Focused Assessments   R Recommendations: See Admitting Provider Note  Report given to:   Additional Notes:

## 2018-10-15 NOTE — ED Provider Notes (Signed)
[facility] EMERGENCY DEPARTMENT Provider Note   CSN: [id number] Arrival date & time: 10/15/18  1311    History   Chief Complaint Chief Complaint  Patient presents with  . Abnormal Lab    HPI Tonya [name] is a 78 y.o. female with a past medical history of prior CVA, diabetes, hypertension, hyperlipidemia presents to ED for low hemoglobin from PCPs office.  She admits that for the past 4 to 5 months she has had decreased energy, decreased appetite, lightheadedness.  She relates this to an incident in which she was shown how to use an insulin needle when she was first diagnosed with diabetes.  She states that "I ain't been right since then."  She told her provider that she does not want insulin and she was instead placed on metformin and subsequently now on Januvia.  She denies any source of bleeding, no blood in her stool, no vomiting blood, no vaginal bleeding.  She denies ever having been told she had iron deficiency anemia, has not taken iron pills, denies any lightheadedness and has never received a blood transfusion.  She denies any injuries or falls.     HPI  Past Medical History:  Diagnosis Date  . Cerebrovascular accident ([place])   . Chronic edema    ? venous insufficiency  . COPD (chronic obstructive pulmonary disease) ([place])    never had a problem breathing  . Diabetes mellitus without complication ([place])   . Enlarged heart    per pt  . Gallstones   . GERD (gastroesophageal reflux disease)   . Hyperlipidemia   . Hypertension     Patient Active Problem List   Diagnosis Date Noted  . D-dimer, elevated 10/11/2017  . Leg edema, left 10/10/2017  . Lesion of lip 10/10/2017  . Type 2 diabetes mellitus with complication, without long-term current use of insulin ([place]) 07/19/2017  . Vitamin D deficiency 07/17/2017  . Spinal stenosis of lumbar region 01/23/2017  . Frequent PVCs 11/23/2016  . Heart murmur 09/19/2016  . Iron deficiency anemia 05/17/2016  .  Chronic right hip pain 06/08/2015  . Abnormal EKG 02/23/2015  . Bradycardia 10/20/2014  . Routine general medical examination at a health care facility 01/02/2013  . Hypertonicity of bladder 12/20/2009  . Hyperlipidemia with target LDL less than 100 12/14/2008  . GERD 11/03/2008  . Right lumbar radiculitis 05/18/2008  . Essential hypertension 07/26/2007  . COPD 07/26/2007  . CVA, old, hemiparesis ([place]) 07/26/2007    Past Surgical History:  Procedure Laterality Date  . ABDOMINAL HYSTERECTOMY    . CHOLECYSTECTOMY       OB History   No obstetric history on file.      Home Medications    Prior to Admission medications   Medication Sig Start Date End Date Taking? Authorizing Provider  aspirin 81 MG tablet Take 81 mg by mouth daily.      [provider]  atorvastatin (LIPITOR) 10 MG tablet TAKE 1 TABLET(10 MG) BY MOUTH DAILY Patient not taking: Reported on 10/15/2018 06/26/18   Forrest Moron, MD  Blood Glucose Monitoring Suppl (ONETOUCH VERIO) w/Device KIT Prick finger 2 times a day to test blood glucose level 06/26/18   Forrest Moron, MD  Blood Pressure Monitoring [facility]) MISC 1 Units by Does not apply route every morning. 09/17/18   Adrian Prows, MD  Cholecalciferol 2000 units TABS Take 1 tablet (2,000 Units total) by mouth daily. 07/17/17   Janith Lima, MD  hydrochlorothiazide (HYDRODIURIL)  25 MG tablet Take 25 mg by mouth daily.    [provider]  Lancets (ONETOUCH ULTRASOFT) lancets Ultra soft smooth 33 gauge. Prick finger 2 times a day to test blood glucose level Patient taking differently: One touch verio  smooth 33 gauge. Prick finger 2 times a day to test blood glucose level 08/07/18   Delia Chimes A, MD  metoprolol tartrate (LOPRESSOR) 25 MG tablet Take 0.5 tablets (12.5 mg total) by mouth 2 (two) times daily. Patient not taking: Reported on 10/15/2018 09/02/18   Forrest Moron, MD  olmesartan-hydrochlorothiazide (BENICAR HCT) 20-12.5 MG  tablet TAKE 1 TABLET BY MOUTH DAILY Patient not taking: Reported on 10/15/2018 07/24/18   Adrian Prows, MD  [facility] VERIO test strip 1 each by Other route 3 (three) times daily. USE TO TEST BLOOD SUGAR UP TO QID Patient not taking: Reported on 10/15/2018 05/24/18   Horald Pollen, MD  sitaGLIPtin (JANUVIA) 50 MG tablet Take 1 tablet (50 mg total) by mouth daily. 08/26/18   Renato Shin, MD    Family History Family History  Problem Relation Age of Onset  . Cirrhosis Mother   . Heart disease Father   . Cancer Other   . Hypertension Other   . Diabetes Other     Social History Social History   Tobacco Use  . Smoking status: Former Smoker    Packs/day: 0.25    Years: 1.00    Pack years: 0.25    Quit date: 07/23/1968    Years since quitting: 50.2  . Smokeless tobacco: Never Used  Substance Use Topics  . Alcohol use: No    Alcohol/week: 0.0 standard drinks  . Drug use: No     Allergies   Hydrocodone, Iohexol, Naproxen, and Propoxyphene n-acetaminophen   Review of Systems Review of Systems  Constitutional: Positive for appetite change and fatigue. Negative for chills and fever.  HENT: Negative for ear pain, rhinorrhea, sneezing and sore throat.   Eyes: Negative for photophobia and visual disturbance.  Respiratory: Negative for cough, chest tightness, shortness of breath and wheezing.   Cardiovascular: Negative for chest pain and palpitations.  Gastrointestinal: Negative for abdominal pain, blood in stool, constipation, diarrhea, nausea and vomiting.  Genitourinary: Negative for dysuria, hematuria and urgency.  Musculoskeletal: Negative for myalgias.  Skin: Negative for rash.  Neurological: Positive for light-headedness. Negative for dizziness and weakness.     Physical Exam Updated Vital Signs BP (!) 161/73 (BP Location: Right Arm)   Pulse 93   Temp 98.7 F (37.1 C) (Oral)   Resp 16   Ht '5\' 11"'  (1.803 m)   Wt 76.2 kg   SpO2 97%   BMI 23.43 kg/m   Physical Exam  Vitals signs and nursing note reviewed.  Constitutional:      General: She is not in acute distress.    Appearance: She is well-developed.  HENT:     Head: Normocephalic and atraumatic.     Nose: Nose normal.  Eyes:     General: No scleral icterus.       Left eye: No discharge.     Conjunctiva/sclera: Conjunctivae normal.  Neck:     Musculoskeletal: Normal range of motion and neck supple.  Cardiovascular:     Rate and Rhythm: Normal rate and regular rhythm.     Heart sounds: Normal heart sounds. No murmur. No friction rub. No gallop.   Pulmonary:     Effort: Pulmonary effort is normal. No respiratory distress.     Breath  sounds: Normal breath sounds.  Abdominal:     General: Bowel sounds are normal. There is no distension.     Palpations: Abdomen is soft.     Tenderness: There is no abdominal tenderness. There is no guarding.  Genitourinary:    Rectum: No external hemorrhoid or internal hemorrhoid.     Comments: Stool dark brown on rectal exam. Musculoskeletal: Normal range of motion.     Left lower leg: Edema (chronic) present.  Skin:    General: Skin is warm and dry.     Findings: No rash.  Neurological:     General: No focal deficit present.     Mental Status: She is alert and oriented to person, place, and time.     Cranial Nerves: No cranial nerve deficit.     Sensory: No sensory deficit.     Motor: No weakness or abnormal muscle tone.     Coordination: Coordination normal.     Comments: Pupils reactive. No facial asymmetry noted. Cranial nerves appear grossly intact. Sensation intact to light touch on face, BUE and BLE.      ED Treatments / Results  Labs (all labs ordered are listed, but only abnormal results are displayed) Labs Reviewed  COMPREHENSIVE METABOLIC PANEL - Abnormal; Notable for the following components:      Result Value   Glucose, Bld 168 (*)    Total Protein 6.2 (*)    Albumin 3.0 (*)    All other components within normal limits  CBC -  Abnormal; Notable for the following components:   Hemoglobin 6.4 (*)    HCT 24.6 (*)    MCV 62.3 (*)    MCH 16.2 (*)    MCHC 26.0 (*)    RDW 18.9 (*)    All other components within normal limits  PROTIME-INR  POC OCCULT BLOOD, ED  TYPE AND SCREEN  PREPARE RBC (CROSSMATCH)  ABO/RH    EKG None  Radiology No results found.  Procedures .Critical Care Performed by: Delia Heady, PA-C Authorized by: Delia Heady, PA-C   Critical care provider statement:    Critical care time (minutes):  35   Critical care was necessary to treat or prevent imminent or life-threatening deterioration of the following conditions:  Circulatory failure and shock   Critical care was time spent personally by me on the following activities:  Development of treatment plan with patient or surrogate, discussions with consultants, examination of patient, obtaining history from patient or surrogate, ordering and performing treatments and interventions and ordering and review of laboratory studies   I assumed direction of critical care for this patient from another provider in my specialty: no     (including critical care time)  Medications Ordered in ED Medications  0.9 %  sodium chloride infusion (Manually program via Guardrails IV Fluids) (has no administration in time range)     Initial Impression / Assessment and Plan / ED Course  I have reviewed the triage vital signs and the nursing notes.  Pertinent labs & imaging results that were available during my care of the patient were reviewed by me and considered in my medical decision making (see chart for details).        78 year old female with past medical history of prior CVA, diabetes presents to ED for 4 to 28-monthhistory of lightheadedness, fatigue, and decreased appetite.  She was sent by her PCP for hemoglobin of 6.6 checked in office today, down from 10 several months ago.  She states that she  has had progressive worsening of her fatigue and  lightheadedness over the past several months due to anxiety from being a recently diagnosed diabetic.  She reports compliance with her home Januvia.  She denies any source of bleeding.  Hemoccult is negative.  She has no known history of iron deficiency anemia.  Hemoglobin is 6.4 on recheck here, CMP unremarkable.  2 units of blood ordered.  Patient herself and daughters on the phone are adamant that patient does not need a COVID-19 test and refused to get testing when "we know she does not have it, she is not even there because of that."  I discussed risks and benefits of testing but patient still refuses.  She will need to be admitted for further work-up and management of her symptomatic anemia.     Portions of this note were generated with [employer]. Dictation errors may occur despite best attempts at proofreading.   Final Clinical Impressions(s) / ED Diagnoses   Final diagnoses:  Symptomatic anemia    ED Discharge Orders    None       Delia Heady, PA-C 10/15/18 [place], [place], DO 10/15/18 1519

## 2018-10-15 NOTE — ED Triage Notes (Signed)
Sent from PCP for hgb drop from 10-6 over the last 2-3 months- pt denies any bloody or dark stools.

## 2018-10-15 NOTE — ED Notes (Signed)
Attempted to call report

## 2018-10-15 NOTE — Patient Instructions (Addendum)
If you have lab work done today you will be contacted with your lab results within the next 2 weeks.  If you have not heard from [place] then please contact us. The fastest way to get your results is to register for My Chart.   IF you received an x-ray today, you will receive an invoice from [facility]. Please contact [facility] at [phone] with questions or concerns regarding your invoice.   IF you received labwork today, you will receive an invoice from [facility]. Please contact [facility] at [phone] with questions or concerns regarding your invoice.   Our billing staff will not be able to assist you with questions regarding bills from these companies.  You will be contacted with the lab results as soon as they are available. The fastest way to get your results is to activate your My Chart account. Instructions are located on the last page of this paperwork. If you have not heard from [place] regarding the results in 2 weeks, please contact this office.     Blood Transfusion, Adult A blood transfusion is a procedure in which you are given blood through an IV tube. You may need this procedure because of:  Illness.  Surgery.  Injury. The blood may come from someone else (a donor). You may also be able to donate blood for yourself (autologous blood donation). The blood given in a transfusion is made up of different types of cells. You may get:  Red blood cells. These carry oxygen to the cells in the body.  White blood cells. These help you fight infections.  Platelets. These help your blood to clot.  Plasma. This is the liquid part of your blood. It helps with fluid imbalances. If you have a clotting disorder, you may also get other types of blood products. What happens before the procedure?  You will have a blood test to find out your blood type. The test also finds out what type of blood your body will accept and matches it to the donor type.  If you are  going to have a planned surgery, you may be able to donate your own blood. This may be done in case you need a transfusion.  If you have had an allergic reaction to a transfusion in the past, you may be given medicine to help prevent a reaction. This medicine may be given to you by mouth or through an IV.  You will have your temperature, blood pressure, and pulse checked.  Follow instructions from your doctor about what you cannot eat or drink.  Ask your doctor about: ? Changing or stopping your regular medicines. This is important if you take diabetes medicines or blood thinners. ? Taking medicines such as aspirin and ibuprofen. These medicines can thin your blood. Do not take these medicines before your procedure if your doctor tells you not to. What happens during the procedure?  An IV tube will be put into one of your veins.  The bag of donated blood will be attached to your IV tube. Then, the blood will enter through your vein.  Your temperature, blood pressure, and pulse will be checked regularly during the procedure. This is done to find early signs of a transfusion reaction.  If you have any signs or symptoms of a reaction, your transfusion will be stopped. You may also be given medicine.  When the transfusion is done, your IV tube will be taken out.  Pressure may be applied to the IV  site for a few minutes.  A bandage (dressing) will be put on the IV site. The procedure may vary among doctors and hospitals. What happens after the procedure?  Your temperature, blood pressure, heart rate, breathing rate, and blood oxygen level will be checked often.  Your blood may be tested to see how you are responding to the transfusion.  You may be warmed with fluids or blankets. This is done to keep the temperature of your body normal. Summary  A blood transfusion is a procedure in which you are given blood through an IV tube.  The blood may come from someone else (a donor). You may  also be able to donate blood for yourself.  If you have had an allergic reaction to a transfusion in the past, you may be given medicine to help prevent a reaction. This medicine may be given to you by mouth or through an IV tube.  Your temperature, blood pressure, heart rate, breathing rate, and blood oxygen level will be checked often.  Your blood may be tested to see how you are responding to the transfusion. This information is not intended to replace advice given to you by your health care provider. Make sure you discuss any questions you have with your health care provider. Document Released: 07/07/2008 Document Revised: 12/03/2015 Document Reviewed: 12/03/2015 Elsevier Interactive Patient Education  [employer].

## 2018-10-15 NOTE — Progress Notes (Signed)
Established Patient Office Visit  Subjective:  Patient ID: Tonya [name], female    DOB: [birth year]  Age: 78 y.o. MRN: [id number]  CC:  Chief Complaint  Patient presents with  . med check    HPI Tonya [name] presents for   She reports that she feels like she can't breathe when she is standing up She states that she feels like her heart is jumping out of her chest when she is trying to breath She states that she has to la She reports that she takes deep breaths and lays down to catch her breath She gets up to fix her breakfast and then her breathing gets difficult  She states that she feels off balance and dizzy after she stands up Any movement seems to cause the dizziness and difficulty breathing  She reports that sitting in the chair also triggers her symptoms She stats that she gets nervous and she feels like she is breathing like she is scared.   She has not heard back from the Neurology  She is only taking aspirin and [place].  Past Medical History:  Diagnosis Date  . Cerebrovascular accident ([place])   . Chronic edema    ? venous insufficiency  . COPD (chronic obstructive pulmonary disease) ([place])    never had a problem breathing  . Diabetes mellitus without complication ([place])   . Enlarged heart    per pt  . Gallstones   . GERD (gastroesophageal reflux disease)   . Hyperlipidemia   . Hypertension     Past Surgical History:  Procedure Laterality Date  . ABDOMINAL HYSTERECTOMY    . CHOLECYSTECTOMY      Family History  Problem Relation Age of Onset  . Cirrhosis Mother   . Heart disease Father   . Cancer Other   . Hypertension Other   . Diabetes Other     Social History   Socioeconomic History  . Marital status: Widowed    Spouse name: Not on file  . Number of children: 5  . Years of education: Not on file  . Highest education level: Not on file  Occupational History  . Not on file  Social Needs  . Financial resource strain: Not on file  .  Food insecurity    Worry: Not on file    Inability: Not on file  . Transportation needs    Medical: Not on file    Non-medical: Not on file  Tobacco Use  . Smoking status: Former Smoker    Packs/day: 0.25    Years: 1.00    Pack years: 0.25    Quit date: 07/23/1968    Years since quitting: 50.2  . Smokeless tobacco: Never Used  Substance and Sexual Activity  . Alcohol use: No    Alcohol/week: 0.0 standard drinks  . Drug use: No  . Sexual activity: Not Currently  Lifestyle  . Physical activity    Days per week: Not on file    Minutes per session: Not on file  . Stress: Not on file  Relationships  . Social Herbalist on phone: Not on file    Gets together: Not on file    Attends religious service: Not on file    Active member of club or organization: Not on file    Attends meetings of clubs or organizations: Not on file    Relationship status: Not on file  . Intimate partner violence    Fear of current or ex  partner: Not on file    Emotionally abused: Not on file    Physically abused: Not on file    Forced sexual activity: Not on file  Other Topics Concern  . Not on file  Social History Narrative  . Not on file    Outpatient Medications Prior to Visit  Medication Sig Dispense Refill  . aspirin 81 MG tablet Take 81 mg by mouth daily.      . Blood Glucose Monitoring Suppl (ONETOUCH VERIO) w/Device KIT Prick finger 2 times a day to test blood glucose level 1 kit 0  . Blood Pressure Monitoring (SPHYGMOMANOMETER) MISC 1 Units by Does not apply route every morning. 1 each 0  . Lancets (ONETOUCH ULTRASOFT) lancets Ultra soft smooth 33 gauge. Prick finger 2 times a day to test blood glucose level (Patient taking differently: One touch verio  smooth 33 gauge. Prick finger 2 times a day to test blood glucose level) 100 each 12  . sitaGLIPtin (JANUVIA) 50 MG tablet Take 1 tablet (50 mg total) by mouth daily. 90 tablet 3  . atorvastatin (LIPITOR) 10 MG tablet TAKE 1  TABLET(10 MG) BY MOUTH DAILY (Patient not taking: Reported on 10/15/2018) 90 tablet 1  . Cholecalciferol 2000 units TABS Take 1 tablet (2,000 Units total) by mouth daily. 90 tablet 1  . hydrochlorothiazide (HYDRODIURIL) 25 MG tablet Take 25 mg by mouth daily.    . metoprolol tartrate (LOPRESSOR) 25 MG tablet Take 0.5 tablets (12.5 mg total) by mouth 2 (two) times daily. (Patient not taking: Reported on 10/15/2018) 60 tablet 0  . olmesartan-hydrochlorothiazide (BENICAR HCT) 20-12.5 MG tablet TAKE 1 TABLET BY MOUTH DAILY (Patient not taking: Reported on 10/15/2018) 90 tablet 3  . ONETOUCH VERIO test strip 1 each by Other route 3 (three) times daily. USE TO TEST BLOOD SUGAR UP TO QID (Patient not taking: Reported on 10/15/2018) 100 each 0   No facility-administered medications prior to visit.     Allergies  Allergen Reactions  . Hydrocodone   . Iohexol      Code: HIVES, Desc: PT developed hive and fullness in throat post injection of 125cc's Omni 300, Onset Date: [id number]   . Naproxen   . Propoxyphene N-Acetaminophen     ROS Review of Systems  Constitutional: Positive for fatigue. Negative for chills and diaphoresis.  Respiratory: Positive for shortness of breath. Negative for cough, choking, chest tightness, wheezing and stridor.   Cardiovascular: Positive for palpitations. Negative for chest pain and leg swelling.  Gastrointestinal: Negative for abdominal distention, anal bleeding and blood in stool.  Neurological: Positive for dizziness and light-headedness. Negative for seizures, syncope and speech difficulty.  Psychiatric/Behavioral: Negative for agitation and behavioral problems. The patient is nervous/anxious.       Objective:     Pulse 97   Temp 98.5 F (36.9 C) (Oral)   Resp 17   Ht 6' (1.829 m)   Wt 168 lb 9.6 oz (76.5 kg)   SpO2 100%   BMI 22.87 kg/m  Wt Readings from Last 3 Encounters:  10/15/18 168 lb 9.6 oz (76.5 kg)  09/30/18 169 lb 3.2 oz (76.7 kg)  09/04/18 174  lb 4.8 oz (79.1 kg)    Physical Exam  Constitutional: She is oriented to person, place, and time. She appears well-developed and well-nourished.  HENT:  Head: Normocephalic and atraumatic.  Cardiovascular: Normal rate, regular rhythm and normal heart sounds.  Pulmonary/Chest: Effort normal and breath sounds normal. No respiratory distress. She has no wheezes. She  has no rales.  Abdominal: Soft. Bowel sounds are normal. She exhibits no distension. There is no abdominal tenderness. There is no rebound.  Neurological: She is alert and oriented to person, place, and time. She has normal reflexes.  Psychiatric: Her behavior is normal. Judgment and thought content normal.  Nervous affect    ECG 10/15/2018 - sinus rhythm with poor r-wave progression  Result status: Final result  Echocardiogram 08/28/2018: Left ventricle cavity is normal in size. Moderate concentric hypertrophy of the left ventricle. Normal global wall motion. Doppler evidence of grade II (pseudonormal) diastolic dysfunction, elevated LAP. Calculated EF 53%. Mild (Grade I) mitral regurgitation. Mild tricuspid regurgitation. Estimated pulmonary artery systolic pressure 26 mmHg. Mild pulmonic regurgitation.     Component     Latest Ref Rng & Units 10/10/2017 03/30/2018 10/15/2018  WBC     4.6 - 10.2 K/uL 5.3 4.9 6.8  RBC     4.04 - 5.48 M/uL 4.46 4.01 3.90 (A)  Hemoglobin     11 - 14.6 g/dL 12.2 10.4 (L) 6.6 (A)  HCT     29 - 41 % 37.8 34.0 22.7 (A)  MCV     76 - 111 fL 84.6 85 58.2 (A)  MCH     27 - 31.2 pg  25.9 (L) 17.0 (A)  MCHC     31.8 - 35.4 g/dL 32.2 30.6 (L) 29.1 (A)  RDW     12.3 - 15.4 % 13.1 12.1 (L)   Platelets     150 - 450 x10E3/uL 265.0 307    There are no preventive care reminders to display for this patient.  There are no preventive care reminders to display for this patient.  Lab Results  Component Value Date   TSH 2.420 03/30/2018   Lab Results  Component Value Date   WBC 6.8 10/15/2018    HGB 6.6 (A) 10/15/2018   HCT 22.7 (A) 10/15/2018   MCV 58.2 (A) 10/15/2018   PLT 307 03/30/2018   Lab Results  Component Value Date   NA 139 09/30/2018   K 4.3 09/30/2018   CO2 23 09/30/2018   GLUCOSE 195 (H) 09/30/2018   BUN 10 09/30/2018   CREATININE 0.63 09/30/2018   BILITOT 0.5 03/30/2018   ALKPHOS 75 03/30/2018   AST 12 03/30/2018   ALT 12 03/30/2018   PROT 6.4 03/30/2018   ALBUMIN 3.9 03/30/2018   CALCIUM 9.2 09/30/2018   GFR 94.31 10/10/2017   Lab Results  Component Value Date   CHOL 137 03/30/2018   Lab Results  Component Value Date   HDL 49 03/30/2018   Lab Results  Component Value Date   LDLCALC 72 03/30/2018   Lab Results  Component Value Date   TRIG 80 03/30/2018   Lab Results  Component Value Date   CHOLHDL 2.8 03/30/2018   Lab Results  Component Value Date   HGBA1C 7.2 (A) 08/20/2018      Assessment & Plan:   Problem List Items Addressed This Visit      Nervous and Auditory   CVA, old, hemiparesis ([place])    Other Visit Diagnoses    Dizziness and giddiness    -  Primary   Relevant Orders   EKG 12-Lead (Completed)   POCT CBC (Completed)   POCT glucose (manual entry) (Completed)   POCT urinalysis dipstick   Orthostatic vital signs   Stress reaction causing mixed disturbance of emotion and conduct       Symptomatic anemia  Patient transferred to the ER by paramedics for symptomatic anemia Verified CBC 3 times and it is consistently 6.7 Orthostatic hypotension negative Previous CBC SHOW PROGRESSIVE DECLINED IN HEMOGLOBIN She is not aware of any blood per rectum She has a history of anemia in herself and other family members She previously had low hemoglobin but was never treated with iron  Her evaluation with cardiology showed frequent PVCs She has no a. Fib  Pt with her daughter who will go with her to the ER Currently due to hemodynamic instability patient is being transferred by paramedics so that she does not pass out  with an untrained family member.  They are agreeable to this plan.     No orders of the defined types were placed in this encounter.   Follow-up: No follow-ups on file.    Forrest Moron, MD

## 2018-10-15 NOTE — ED Notes (Signed)
Reported critical hbg of 6.4 to EDP

## 2018-10-16 ENCOUNTER — Inpatient Hospital Stay (HOSPITAL_COMMUNITY): Payer: Medicare HMO

## 2018-10-16 DIAGNOSIS — K621 Rectal polyp: Secondary | ICD-10-CM | POA: Diagnosis not present

## 2018-10-16 DIAGNOSIS — C787 Secondary malignant neoplasm of liver and intrahepatic bile duct: Secondary | ICD-10-CM | POA: Diagnosis not present

## 2018-10-16 DIAGNOSIS — C189 Malignant neoplasm of colon, unspecified: Secondary | ICD-10-CM | POA: Diagnosis not present

## 2018-10-16 DIAGNOSIS — D649 Anemia, unspecified: Secondary | ICD-10-CM | POA: Diagnosis not present

## 2018-10-16 DIAGNOSIS — R935 Abnormal findings on diagnostic imaging of other abdominal regions, including retroperitoneum: Secondary | ICD-10-CM | POA: Diagnosis not present

## 2018-10-16 LAB — BPAM RBC
Blood Product Expiration Date: 202007052359
Blood Product Expiration Date: 202007092359
ISSUE DATE / TIME: 202006231542
ISSUE DATE / TIME: 202006232237
Unit Type and Rh: 6200
Unit Type and Rh: 6200

## 2018-10-16 LAB — CBC
HCT: 29.6 % — ABNORMAL LOW (ref 36.0–46.0)
Hemoglobin: 8.8 g/dL — ABNORMAL LOW (ref 12.0–15.0)
MCH: 19.6 pg — ABNORMAL LOW (ref 26.0–34.0)
MCHC: 29.7 g/dL — ABNORMAL LOW (ref 30.0–36.0)
MCV: 65.8 fL — ABNORMAL LOW (ref 80.0–100.0)
Platelets: 273 10*3/uL (ref 150–400)
RBC: 4.5 MIL/uL (ref 3.87–5.11)
RDW: 22.7 % — ABNORMAL HIGH (ref 11.5–15.5)
WBC: 7.1 10*3/uL (ref 4.0–10.5)
nRBC: 0 % (ref 0.0–0.2)

## 2018-10-16 LAB — BASIC METABOLIC PANEL
Anion gap: 7 (ref 5–15)
BUN: 10 mg/dL (ref 8–23)
CO2: 25 mmol/L (ref 22–32)
Calcium: 8.8 mg/dL — ABNORMAL LOW (ref 8.9–10.3)
Chloride: 107 mmol/L (ref 98–111)
Creatinine, Ser: 0.67 mg/dL (ref 0.44–1.00)
GFR calc Af Amer: >60 mL/min (ref >60)
GFR calc non Af Amer: >60 mL/min (ref >60)
Glucose, Bld: 172 mg/dL — ABNORMAL HIGH (ref 70–99)
Potassium: 3.6 mmol/L (ref 3.5–5.1)
Sodium: 139 mmol/L (ref 135–145)

## 2018-10-16 LAB — URINE CULTURE: Culture: <10000 — AB

## 2018-10-16 LAB — TYPE AND SCREEN
ABO/RH(D): A POS
Antibody Screen: NEGATIVE
Unit division: 0
Unit division: 0

## 2018-10-16 LAB — SARS CORONAVIRUS 2 BY RT PCR (HOSPITAL ORDER, PERFORMED IN ~~LOC~~ HOSPITAL LAB): SARS Coronavirus 2: NEGATIVE

## 2018-10-16 LAB — PROTIME-INR
INR: 1.1 (ref 0.8–1.2)
Prothrombin Time: 13.9 seconds (ref 11.4–15.2)

## 2018-10-16 MED ORDER — AMLODIPINE BESYLATE 5 MG PO TABS: 5.0000 mg | ORAL_TABLET | Freq: Every day | ORAL | 11 refills | Status: DC

## 2018-10-16 MED ORDER — AMLODIPINE BESYLATE 5 MG PO TABS
5.0000 mg | ORAL_TABLET | Freq: Every day | ORAL | Status: DC
  Administered 2018-10-16: 5 mg via ORAL
  Filled 2018-10-16: qty 1

## 2018-10-16 MED ORDER — AMLODIPINE BESYLATE 10 MG PO TABS
10.0000 mg | ORAL_TABLET | Freq: Every day | ORAL | Status: DC
  Administered 2018-10-17 – 2018-10-18 (×2): 10 mg via ORAL
  Filled 2018-10-16 (×2): qty 1

## 2018-10-16 MED ORDER — AMLODIPINE BESYLATE 10 MG PO TABS: 10.0000 mg | ORAL_TABLET | Freq: Every day | ORAL | 0 refills | Status: DC

## 2018-10-16 MED ORDER — AMLODIPINE BESYLATE 5 MG PO TABS
5.0000 mg | ORAL_TABLET | Freq: Once | ORAL | Status: AC
  Administered 2018-10-16: 5 mg via ORAL
  Filled 2018-10-16: qty 1

## 2018-10-16 NOTE — Discharge Instructions (Addendum)
Blood Transfusion, Adult, Care After This sheet gives you information about how to care for yourself after your procedure. Your doctor may also give you more specific instructions. If you have problems or questions, contact your doctor. Follow these instructions at home:   Take over-the-counter and prescription medicines only as told by your doctor.  Go back to your normal activities as told by your doctor.  Follow instructions from your doctor about how to take care of the area where an IV tube was put into your vein (insertion site). Make sure you: ? Wash your hands with soap and water before you change your bandage (dressing). If there is no soap and water, use hand sanitizer. ? Change your bandage as told by your doctor.  Check your IV insertion site every day for signs of infection. Check for: ? More redness, swelling, or pain. ? More fluid or blood. ? Warmth. ? Pus or a bad smell. Contact a doctor if:  You have more redness, swelling, or pain around the IV insertion site.  You have more fluid or blood coming from the IV insertion site.  Your IV insertion site feels warm to the touch.  You have pus or a bad smell coming from the IV insertion site.  Your pee (urine) turns pink, red, or brown.  You feel weak after doing your normal activities. Get help right away if:  You have signs of a serious allergic or body defense (immune) system reaction, including: ? Itchiness. ? Hives. ? Trouble breathing. ? Anxiety. ? Pain in your chest or lower back. ? Fever, flushing, and chills. ? Fast pulse. ? Rash. ? Watery poop (diarrhea). ? Throwing up (vomiting). ? Dark pee. ? Serious headache. ? Dizziness. ? Stiff neck. ? Yellow color in your face or the white parts of your eyes (jaundice). Summary  After a blood transfusion, return to your normal activities as told by your doctor.  Every day, check for signs of infection where the IV tube was put into your vein.  Some  signs of infection are warm skin, more redness and pain, more fluid or blood, and pus or a bad smell where the needle went in.  Contact your doctor if you feel weak or have any unusual symptoms. This information is not intended to replace advice given to you by your health care provider. Make sure you discuss any questions you have with your health care provider. Document Released: 05/01/2014 Document Revised: 12/03/2015 Document Reviewed: 12/03/2015 Elsevier Interactive Patient Education  2019 [employer] Inc[employer]  

## 2018-10-16 NOTE — Progress Notes (Signed)
Pt and daughter requested for vitals to be checked after 6am in the am and not at midnight; pt daughter said she want mother to have a good rest; refused for staff to be frequently checking on pt; daughter said she will be taking care of mother and they don't want to be disturbed during the night. Pt stable and resting comfortably in bed with call light within reach and daughter remains at bedside. RN reinforced and educated to be called if she's needed. Delia Heady RN

## 2018-10-16 NOTE — TOC Transition Note (Addendum)
Transition of Care [facility]) - CM/SW Discharge Note   Patient Details  Name: TERRIKA ZUVER MRN: [id number] Date of Birth: December 24, 1940  Transition of Care [facility]) CM/SW Contact:  Pollie Friar, RN Phone Number: 10/16/2018, 1:21 PM   Clinical Narrative:    Plan is for pt to d/c home with [facility] services. Daughter will provide transportation home.    Final next level of care: [place] with [facility] accepting the referral Barriers to Discharge: No Barriers Identified   Patient Goals and CMS Choice   CMS Medicare.gov Compare Post Acute Care list provided to:: Patient Represenative (must comment) Choice offered to / list presented to : Adult Children  Discharge Placement                       Discharge Plan and Services                          HH Arranged: RN, PT [facility] Agency: [place] Date [facility] Agency Contacted: 10/16/18 Time [place]: [phone] Representative spoke with at [place]: [place] ([place]) Interventions     Readmission Risk Interventions No flowsheet data found.

## 2018-10-16 NOTE — Care Management CC44 (Signed)
Condition Code 44 Documentation Completed  Patient Details  Name: OLIVIYA GILKISON MRN: [id number] Date of Birth: 06/08/1940   Condition Code 44 given:  Yes Patient signature on Condition Code 44 notice:  Yes Documentation of 2 MD's agreement:  Yes Code 44 added to claim:  Yes    Pollie Friar, RN 10/16/2018, 3:37 PM

## 2018-10-16 NOTE — Evaluation (Signed)
Physical Therapy Evaluation Patient Details Name: Tonya [name] MRN: [id number] DOB: 06/13/1940 Today's Date: 10/16/2018   History of Present Illness  Patient presented to the hospital with progrssive wekness. She was found to have decreased HGB and HCT. She revieved a unit of blood.   Clinical Impression  Patient appears to be close to her baseline mobility. She reports at times she feels syncope when she is up for 15 minutes. Therapy was in for more then 15 minutes without syncope. She was put in a recliner and advised to call the nurse if she does feel syncope. She was able to walk in the room with good stability. She may benefit from home health for endurance training as stated above is close to baseline. Skilled therapy will continue to work with her while she is here to address the above deficits.     Follow Up Recommendations Home health PT    Equipment Recommendations  None recommended by PT    Recommendations for Other Services       Precautions / Restrictions Precautions Precautions: None Precaution Comments: old CVA with left UE and LE weakness Restrictions Weight Bearing Restrictions: No      Mobility  Bed Mobility Overal bed mobility: Independent             General bed mobility comments: sat up at the edge of the bed without difficulty   Transfers Overall transfer level: Needs assistance Equipment used: Straight cane Transfers: Sit to/from Stand Sit to Stand: Supervision         General transfer comment: cuing for safety with lines and leads. Supervision for inital balance. Sit to stand 3x with therapy   Ambulation/Gait Ambulation/Gait assistance: Supervision Gait Distance (Feet): 20 Feet Assistive device: Straight cane Gait Pattern/deviations: Step-to pattern;Decreased stance time - left Gait velocity: decreased   General Gait Details: slow step to gait pattern but stable   Stairs            Wheelchair Mobility    Modified Rankin  (Stroke Patients Only)       Balance                                             Pertinent Vitals/Pain Pain Assessment: No/denies pain    Home Living Family/patient expects to be discharged to:: Private residence Living Arrangements: Children Available Help at Discharge: Family Type of Home: House Home Access: Stairs to enter Entrance Stairs-Rails: Can reach both Entrance Stairs-Number of Steps: 3     Additional Comments: lives with son but son works     Prior Function Level of Independence: Independent with assistive device(s)         Comments: uses a cane. Still does all the cooking and cleaning     Hand Dominance   Dominant Hand: Right    Extremity/Trunk Assessment   Upper Extremity Assessment Upper Extremity Assessment: Overall WFL for tasks assessed;LUE deficits/detail LUE Deficits / Details: baseline strength deficits     Lower Extremity Assessment Lower Extremity Assessment: LLE deficits/detail LLE Deficits / Details: baseline strength deficits     Cervical / Trunk Assessment Cervical / Trunk Assessment: Kyphotic  Communication   Communication: No difficulties  Cognition Arousal/Alertness: Awake/alert Behavior During Therapy: WFL for tasks assessed/performed Overall Cognitive Status: Within Functional Limits for tasks assessed  General Comments General comments (skin integrity, edema, etc.): has bioness in her room for drop foot but it was not put on     Exercises     Assessment/Plan    PT Assessment Patient needs continued PT services  PT Problem List Decreased strength;Decreased activity tolerance;Decreased mobility;Decreased knowledge of use of DME;Decreased safety awareness;Decreased range of motion       PT Treatment Interventions DME instruction;Gait training;Functional mobility training;Therapeutic exercise;Therapeutic activities;Patient/family education     PT Goals (Current goals can be found in the Care Plan section)  Acute Rehab PT Goals Patient Stated Goal: to go home  PT Goal Formulation: With patient Time For Goal Achievement: 10/23/18 Potential to Achieve Goals: Good    Frequency Min 2X/week   Barriers to discharge        Co-evaluation               AM-PAC PT "6 Clicks" Mobility  Outcome Measure Help needed turning from your back to your side while in a flat bed without using bedrails?: None Help needed moving from lying on your back to sitting on the side of a flat bed without using bedrails?: None Help needed moving to and from a bed to a chair (including a wheelchair)?: A Little Help needed standing up from a chair using your arms (e.g., wheelchair or bedside chair)?: A Little Help needed to walk in hospital room?: A Little Help needed climbing 3-5 steps with a railing? : A Little 6 Click Score: 20    End of Session Equipment Utilized During Treatment: Gait belt Activity Tolerance: Patient tolerated treatment well Patient left: in chair;with call bell/phone within reach Nurse Communication: Mobility status PT Visit Diagnosis: Unsteadiness on feet (R26.81);Other abnormalities of gait and mobility (R26.89);Muscle weakness (generalized) (M62.81);History of falling (Z91.81);Difficulty in walking, not elsewhere classified (R26.2)    Time: 5374-8270 PT Time Calculation (min) (ACUTE ONLY): 19 min   Charges:   PT Evaluation $PT Eval Moderate Complexity: 1 Mod           Carney Living PT DPT  10/16/2018, 11:57 AM

## 2018-10-16 NOTE — Discharge Summary (Addendum)
Discharge Summary  Tonya [name] JKD:[id number] DOB: 10-07-1940  PCP: Tonya Moron, MD  Admit date: 10/15/2018 Discharge date: 10/16/2018  Time spent: 35 minutes  Update 1: CT abdomen and pelvis with no contrast done today 10/16/2018 revealed large cecal mass and another mass involving the liver.  GI consulted, Tonya [name], to further assess for possible biopsy.  Patient and family declined colonoscopy and possible liver biopsy at this time.  Daughter Tonya [name] will follow-up with GI outpatient.  Update 2: Patient and family now agreeable to colonoscopy and possible biopsies. In house Covid-19 negative. Colonoscopy planned on Friday 10/18/18 with Dr. Lyndel [name].   CT abdomen and pelvis without contrast done on 10/16/2018: IMPRESSION: 1. There is a large, circumferential mass of the proximal cecum just above ileocecal valve (series 3, image 55, series 6, image 39). There are enlarged lymph nodes of the right mesocolon measuring up to 2.1 cm (series 3, image 47). Findings are consistent with primary colon malignancy.  2. There is a large, hypodense mass of the right lobe of the liver measuring at least 5.9 x 5.0 cm (series 3, image 27). This is new in comparison to remote prior CT dated 2008 and concerning for metastatic lesion.  3. Other chronic, incidental, and postoperative findings as detailed above.  Recommendations for Outpatient Follow-up:  1. Follow-up with GI outpatient 2. Follow-up with your primary care provider 3. Take your medications as prescribed  Discharge Diagnoses:  Active Hospital Problems   Diagnosis Date Noted   Symptomatic anemia 10/15/2018    Resolved Hospital Problems  No resolved problems to display.    Discharge Condition: Stable  Diet recommendation: Resume previous diet  Vitals:   10/16/18 1154 10/16/18 1313  BP: (!) 146/75 (!) 154/81  Pulse: 68   Resp: 20   Temp: 98.4 F (36.9 C)   SpO2: 98%     History of present illness:  Tonya [name]   is a 78 y.o. female, with past medical history significant for hypertension, hyperlipidemia, newly diagnosed diabetes mellitus and old CVA presenting today with increased weakness for the last 1 month with no abdominal pain, nausea vomiting, fever or chills. Patient denies any history of anemia.  She had her colonoscopy around 6 years ago which was negative.  Patient denies any headaches dizziness or loss of consciousness . Patient went to see her primary medical doctor and she was noted to be anemic with a hemoglobin of 6.6. In the emergency room the patient was noted to have a hemoglobin of 6.4 with a creatinine of 0.75 patient is on chronic aspirin 81 mg p.o. daily.  10/16/18: Patient seen and examined at her bedside.  No acute events overnight.  She denies any headaches or dizziness.  Hemoglobin improved from 6.4 on presentation to 8.8 post 2 unit PRBCs.  FOBT negative.  No planned procedure here in the hospital.  GI has been contacted and recommended outpatient follow-up.  Patient declines COVID-19 testing.  No sign of overt bleeding.  CT abdomen pelvis with no contrast has been ordered with findings as stated above.  Hospital Course:  Active Problems:   Symptomatic anemia   Symptomatic anemia post 2 unit PRBC transfusion likely secondary to malignancy Presented with hemoglobin of 6.4 Post 2 unit PRBC transfusion with hemoglobin of 8.8 on 10/16/2018 FOBT negative CT abdomen and pelvis without contrast pending GI has been called started and recommended outpatient follow-up Patient declines COVID-19 testing No planned procedure in the hospital  Newly incidentally found on CT abdomen  and pelvis large cecal mass with possible liver involvement. Findings on CT abdomen and pelvis without contrast done on 10/16/2018 GI consulted for possible biopsies Patient and family declined colonoscopy and liver biopsy Daughter Tonya [name] will follow-up with GI outpatient.  History of CVA with left-sided  weakness Resume aspirin and Lipitor  Hypertension Blood pressure elevated Started on Norvasc 10 mg daily Holding off HCTZ and ARB, patient stating they made her dizzy. Continue home medication Lopressor 12.5 mg twice daily and continue with Norvasc Follow-up with your primary care provider outpatient  Hyperlipidemia Continue Lipitor LFTs normal     Procedures:  None  Consultations:  Follow-up with GI outpatient  Discharge Exam: BP (!) 154/81    Pulse 68    Temp 98.4 F (36.9 C) (Oral)    Resp 20    Ht 5' 11" (1.803 m)    Wt 76.2 kg    SpO2 98%    BMI 23.43 kg/m   General: 78 y.o. year-old female well developed well nourished in no acute distress.  Alert and interactive.  Cardiovascular: Regular rate and rhythm with no rubs or gallops.  No thyromegaly or JVD noted.    Respiratory: Clear to auscultation with no wheezes or rales. Good inspiratory effort.  Abdomen: Soft nontender nondistended with normal bowel sounds x4 quadrants.  Musculoskeletal: No lower extremity edema. 2/4 pulses in all 4 extremities.  Psychiatry: Mood is appropriate for condition and setting  Discharge Instructions You were cared for by a hospitalist during your hospital stay. If you have any questions about your discharge medications or the care you received while you were in the hospital after you are discharged, you can call the unit and asked to speak with the hospitalist on call if the hospitalist that took care of you is not available. Once you are discharged, your primary care physician will handle any further medical issues. Please note that NO REFILLS for any discharge medications will be authorized once you are discharged, as it is imperative that you return to your primary care physician (or establish a relationship with a primary care physician if you do not have one) for your aftercare needs so that they can reassess your need for medications and monitor your lab values.   Allergies as of  10/16/2018      Reactions   Iohexol Hives, Shortness Of Breath, Other (See Comments)   Patient developed hives and fullness in throat post injection of 125cc's Omni 300, Onset Date: 11/15/2006   Hydrocodone Swelling, Other (See Comments)   "I started swelling, became red, and passed out"   Ibuprofen Nausea Only, Other (See Comments)   "It made my stomach hurt"   Naproxen Other (See Comments)   "It made me feel out of my head"   Propoxyphene N-acetaminophen Other (See Comments)   "I couldn't find the door to make my way out of the room- I was in misery"      Medication List    STOP taking these medications   aspirin 81 MG tablet   hydrochlorothiazide 25 MG tablet Commonly known as: HYDRODIURIL   olmesartan-hydrochlorothiazide 20-12.5 MG tablet Commonly known as: BENICAR HCT     TAKE these medications   amLODipine 10 MG tablet Commonly known as: NORVASC Take 1 tablet (10 mg total) by mouth daily. Start taking on: October 17, 2018   atorvastatin 10 MG tablet Commonly known as: LIPITOR TAKE 1 TABLET(10 MG) BY MOUTH DAILY What changed:   how much to take  how to  take this  when to take this  additional instructions   Cholecalciferol 50 MCG (2000 UT) Tabs Take 1 tablet (2,000 Units total) by mouth daily.   metoprolol tartrate 25 MG tablet Commonly known as: LOPRESSOR Take 0.5 tablets (12.5 mg total) by mouth 2 (two) times daily.   onetouch ultrasoft lancets Ultra soft smooth 33 gauge. Prick finger 2 times a day to test blood glucose level What changed:   how much to take  how to take this  when to take this  additional instructions   OneTouch Verio test strip Generic drug: glucose blood 1 each by Other route 3 (three) times daily. USE TO TEST BLOOD SUGAR UP TO QID   OneTouch Verio w/Device Kit Prick finger 2 times a day to test blood glucose level   sitaGLIPtin 50 MG tablet Commonly known as: JANUVIA Take 1 tablet (50 mg total) by mouth daily.     Sphygmomanometer Misc 1 Units by Does not apply route every morning.      Allergies  Allergen Reactions   Iohexol Hives, Shortness Of Breath and Other (See Comments)    Patient developed hives and fullness in throat post injection of 125cc's Omni 300, Onset Date: 11/15/2006    Hydrocodone Swelling and Other (See Comments)    "I started swelling, became red, and passed out"   Ibuprofen Nausea Only and Other (See Comments)    "It made my stomach hurt"   Naproxen Other (See Comments)    "It made me feel out of my head"   Propoxyphene N-Acetaminophen Other (See Comments)    "I couldn't find the door to make my way out of the room- I was in misery"   Follow-up Information    Tonya Moron, MD. Call in 1 day(s).   Specialty: Internal Medicine Why: Please call for a post hospital follow-up appointment. Contact information: [address] [place] [address] [place].   Why: They will coantact you for the first appt. Contact information: [phone]       Milus Banister, MD. Call in 1 day(s).   Specialty: Gastroenterology Why: Please call within a week for a post hospital follow-up appointment. Contact information: [address] [address] [phone]            The results of significant diagnostics from this hospitalization (including imaging, microbiology, ancillary and laboratory) are listed below for reference.    Significant Diagnostic Studies: Ct Abdomen Pelvis Wo Contrast  Result Date: 10/16/2018 CLINICAL DATA:  Anemia, unexplained, post transfusion EXAM: CT ABDOMEN AND PELVIS WITHOUT CONTRAST TECHNIQUE: Multidetector CT imaging of the abdomen and pelvis was performed following the standard protocol without IV contrast. COMPARISON:  CT abdomen pelvis, 11/15/2006 FINDINGS: Lower chest: Trace bilateral pleural effusions and associated atelectasis or consolidation Hepatobiliary: There is a  large, hypodense mass of the right lobe of the liver measuring at least 5.9 x 5.0 cm (series 3, image 27). Status post cholecystic Pancreas: Unremarkable. No pancreatic ductal dilatation or surrounding inflammatory changes. Spleen: Normal in size. Fluid attenuation cystic lesion of the spleen not significantly changed compared to prior examination dated 2000. Adrenals/Urinary Tract: Adrenal glands are unremarkable. Kidneys are normal, without renal calculi, solid lesion, or hydronephrosis. Bladder is unremarkable. Stomach/Bowel: Stomach is within normal limits. Appendix appears normal. There is a large, circumferential mass of the proximal cecum just above ileocecal valve (series 3, image 55, series 6, image 39). Vascular/Lymphatic: Aortic  atherosclerosis. There are enlarged lymph nodes of the right mesocolon measuring up to 2.1 cm (series 3, image 47). Reproductive: Status post hysterectomy. Other: No abdominal wall hernia or abnormality. No abdominopelvic ascites. Musculoskeletal: No acute or significant osseous findings. IMPRESSION: 1. There is a large, circumferential mass of the proximal cecum just above ileocecal valve (series 3, image 55, series 6, image 39). There are enlarged lymph nodes of the right mesocolon measuring up to 2.1 cm (series 3, image 47). Findings are consistent with primary colon malignancy. 2. There is a large, hypodense mass of the right lobe of the liver measuring at least 5.9 x 5.0 cm (series 3, image 27). This is new in comparison to remote prior CT dated 2008 and concerning for metastatic lesion. 3. Other chronic, incidental, and postoperative findings as detailed above. Electronically Signed   By: Eddie Candle M.D.   On: 10/16/2018 11:24    Microbiology: No results found for this or any previous visit (from the past 240 hour(s)).   Labs: Basic Metabolic Panel: Recent Labs  Lab 10/15/18 1225 10/16/18 0453  NA 140 139  K 3.9 3.6  CL 107 107  CO2 25 25  GLUCOSE 168* 172*   BUN 15 10  CREATININE 0.75 0.67  CALCIUM 9.1 8.8*   Liver Function Tests: Recent Labs  Lab 10/15/18 1225  AST 19  ALT 13  ALKPHOS 82  BILITOT 0.5  PROT 6.2*  ALBUMIN 3.0*   No results for input(s): LIPASE, AMYLASE in the last 168 hours. No results for input(s): AMMONIA in the last 168 hours. CBC: Recent Labs  Lab 10/15/18 1212 10/15/18 1225 10/16/18 0453  WBC 6.8 7.6 7.1  HGB 6.6* 6.4* 8.8*  HCT 22.7* 24.6* 29.6*  MCV 58.2* 62.3* 65.8*  PLT  --  328 273   Cardiac Enzymes: No results for input(s): CKTOTAL, CKMB, CKMBINDEX, TROPONINI in the last 168 hours. BNP: BNP (last 3 results) No results for input(s): BNP in the last 8760 hours.  ProBNP (last 3 results) No results for input(s): PROBNP in the last 8760 hours.  CBG: No results for input(s): GLUCAP in the last 168 hours.     Signed:  Kayleen Memos, MD [facility] 10/16/2018, 3:22 PM

## 2018-10-16 NOTE — Care Management Obs Status (Signed)
[facility] NOTIFICATION   Patient Details  Name: SAHORY NORDLING MRN: [id number] Date of Birth: 08-Feb-1941   Medicare Observation Status Notification Given:  Yes    Pollie Friar, RN 10/16/2018, 3:37 PM

## 2018-10-16 NOTE — Progress Notes (Signed)
Pt admitted to the unit from ED last night; pt A&O x4; pt stable during shift; pt tolerated 1unit of RBC received with no reaction noted. Pt VSS;Reported off to oncoming RN. Delia Heady RN

## 2018-10-16 NOTE — Consult Note (Addendum)
[facility] Consult: 12:41 PM 10/16/2018  LOS: 1 day    Referring Provider: Dr Irene Pap   Primary Care Physician:  Forrest Moron, MD Primary Gastroenterologist:  Dr. Ardis Hughs    Reason for Consultation:  Cecal mass, lesion in liver.  Anemia.     HPI: Tonya [name] is a 78 y.o. female.  Past hx CVA 2001.  Chronic edema.  COPD.  DM 2.  Hypertension.  Hyperlipidemia.  Iron def anemia.  Splenic cysts.  Surgeries include hysterectomy, cholecystectomy. She takes low-dose aspirin but no other platelet disrupting or anticoagulation medications.  01/2006 Colonoscopy.  Routine screening study.  No polyps or cancers.  Recommended repeat, average risk, screening study in 10 years 11/2006 EGD.  Dr. Ardis Hughs.  For abdominal pain, weight loss, ?thickened stomach on recent CT.  Study normal.  MD suspected functional dyspepsia, added daily Prilosec. There was no response on the part of the patient to colonoscopy callback letters in 11/2015.  Pt declined EGD/colonoscopy when she was seen at GI office in 05/2016 guarding iron deficiency anemia  Seen by her PMD yesterday for 1 month progressive weakness, DOE.  Labs showed progressive microcytic anemia and she was sent to the emergency room for evaluation, admission, transfusion. Hgb 6.4 >> 8.8 after 2 PRBCs overnight.  Hgb was 10.4 early 03/2018. MCV 58, was 85 in 03/2018. FOBT negative.  Renal function, electrolytes, LFTs, PT/INR normal.    10/16/18 CTAP without contrast.  Large cecal mass with adjacent lymphadenopathy.  Findings consistent with primary colon malignancy.  Large mass in the right lobe of liver, new and concerning for mets.   Since her arrival in the ED the patient has repeatedly declined COVID 19 testing.  She and her daughter feel it is too uncomfortable, will  raise pt's BP and she is too frail after stroke invasive maneuvers such as collecting a deep nasal swab  Patient has not had any obstructive symptoms.  I.e. no nausea, vomiting, change in bowel habits, no bloody or melenic/tarry stool, no abdominal pain.  Overall her appetite is pretty good, her largest meal is lunch time.  Over several years, since her stroke she has lost weight.  Weights: 80.6 kg 1 year ago.  78 kg 06/2018.  76.5 kg currently.  Other than 81 ASA, no NSAIDs, no ETOH.    Fm Hx cirrhosis of liver in mother.    Past Medical History:  Diagnosis Date   Cerebrovascular accident [facility])    Chronic edema    ? venous insufficiency   COPD (chronic obstructive pulmonary disease) ([place])    never had a problem breathing   Diabetes mellitus without complication ([place])    Enlarged heart    per pt   Gallstones    GERD (gastroesophageal reflux disease)    Hyperlipidemia    Hypertension     Past Surgical History:  Procedure Laterality Date   ABDOMINAL HYSTERECTOMY     CHOLECYSTECTOMY      Prior to Admission medications   Medication Sig Start Date End Date Taking? Authorizing Provider  aspirin  81 MG tablet Take 81 mg by mouth daily.     Yes [provider]  atorvastatin (LIPITOR) 10 MG tablet TAKE 1 TABLET(10 MG) BY MOUTH DAILY Patient taking differently: Take 10 mg by mouth daily.  06/26/18  Yes Stallings, Zoe A, MD  hydrochlorothiazide (HYDRODIURIL) 25 MG tablet Take 25 mg by mouth daily.   Yes [provider]  metoprolol tartrate (LOPRESSOR) 25 MG tablet Take 0.5 tablets (12.5 mg total) by mouth 2 (two) times daily. 09/02/18  Yes Stallings, Zoe A, MD  sitaGLIPtin (JANUVIA) 50 MG tablet Take 1 tablet (50 mg total) by mouth daily. 08/26/18  Yes Renato Shin, MD  amLODipine (NORVASC) 5 MG tablet Take 1 tablet (5 mg total) by mouth daily. 10/16/18 10/16/19  Kayleen Memos, DO  Blood Glucose Monitoring Suppl (ONETOUCH VERIO) w/Device KIT Prick finger 2 times a  day to test blood glucose level 06/26/18   Forrest Moron, MD  Blood Pressure Monitoring [facility]) MISC 1 Units by Does not apply route every morning. 09/17/18   Adrian Prows, MD  Cholecalciferol 2000 units TABS Take 1 tablet (2,000 Units total) by mouth daily. Patient not taking: Reported on 10/15/2018 07/17/17   Janith Lima, MD  Lancets [facility] ULTRASOFT) lancets Ultra soft smooth 33 gauge. Prick finger 2 times a day to test blood glucose level Patient taking differently: 1 each by Other route 2 (two) times a day. One Touch Verio smooth 33 gauge 08/07/18   Forrest Moron, MD  olmesartan-hydrochlorothiazide (BENICAR HCT) 20-12.5 MG tablet TAKE 1 TABLET BY MOUTH DAILY 07/24/18   Adrian Prows, MD  [facility] VERIO test strip 1 each by Other route 3 (three) times daily. USE TO TEST BLOOD SUGAR UP TO QID 05/24/18   Horald Pollen, MD    Scheduled Meds:  amLODipine  5 mg Oral Daily   atorvastatin  10 mg Oral q1800   hydrochlorothiazide  25 mg Oral Daily   linagliptin  5 mg Oral Daily   metoprolol tartrate  12.5 mg Oral BID   sodium chloride flush  3 mL Intravenous Q12H   Infusions:  sodium chloride     sodium chloride 50 mL/hr at 10/16/18 0600   PRN Meds: sodium chloride, acetaminophen **OR** acetaminophen, ondansetron **OR** ondansetron (ZOFRAN) IV, sodium chloride flush   Allergies as of 10/15/2018 - Review Complete 10/15/2018  Allergen Reaction Noted   Iohexol Hives, Shortness Of Breath, and Other (See Comments) 11/15/2006   Hydrocodone Swelling and Other (See Comments) 04/30/2008   Ibuprofen Nausea Only and Other (See Comments) 10/15/2018   Naproxen Other (See Comments) 04/30/2008   Propoxyphene n-acetaminophen Other (See Comments) 04/30/2008    Family History  Problem Relation Age of Onset   Cirrhosis Mother    Heart disease Father    Cancer Other    Hypertension Other    Diabetes Other     Social History   Socioeconomic History   Marital  status: Widowed    Spouse name: Not on file   Number of children: 5   Years of education: Not on file   Highest education level: Not on file  Occupational History   Not on file  Social Needs   Financial resource strain: Not on file   Food insecurity    Worry: Not on file    Inability: Not on file   Transportation needs    Medical: Not on file    Non-medical: Not on file  Tobacco Use   Smoking status: Former Smoker  Packs/day: 0.25    Years: 1.00    Pack years: 0.25    Quit date: 07/23/1968    Years since quitting: 50.2   Smokeless tobacco: Never Used  Substance and Sexual Activity   Alcohol use: No    Alcohol/week: 0.0 standard drinks   Drug use: No   Sexual activity: Not Currently  Lifestyle   Physical activity    Days per week: Not on file    Minutes per session: Not on file   Stress: Not on file  Relationships   Social connections    Talks on phone: Not on file    Gets together: Not on file    Attends religious service: Not on file    Active member of club or organization: Not on file    Attends meetings of clubs or organizations: Not on file    Relationship status: Not on file   Intimate partner violence    Fear of current or ex partner: Not on file    Emotionally abused: Not on file    Physically abused: Not on file    Forced sexual activity: Not on file  Other Topics Concern   Not on file  Social History Narrative   Not on file    REVIEW OF SYSTEMS: Constitutional: Weakness, fatigue. ENT:  No nose bleeds Pulm: + DOE.  No cough. CV:  No palpitations, no LE edema.  No angina GU:  No hematuria, no frequency GI: See HPI. Heme: Denies unusual or excessive bleeding or bruising. Transfusions: As per HPI. Neuro: Dizziness.  No headaches, no peripheral tingling or numbness Derm:  No itching, no rash or sores.  Endocrine:  No sweats or chills.  No polyuria or dysuria Immunization: Reviewed.  Last vaccination recorded was Td vaccine in  2011 Travel:  None beyond local counties in last few months.    PHYSICAL EXAM: Vital signs in last 24 hours: Vitals:   10/16/18 1112 10/16/18 1154  BP: (!) 152/70 (!) 146/75  Pulse: 65 68  Resp:  20  Temp:  98.4 F (36.9 C)  SpO2:  98%   Wt Readings from Last 3 Encounters:  10/15/18 76.2 kg  10/15/18 76.5 kg  09/30/18 76.7 kg    General: Somewhat frail, elderly BF.  Laying in bed comfortable. Head: No facial asymmetry or swelling.  No signs of head trauma. Eyes: Scleral icterus.  No conjunctival pallor. Ears: Not hard of hearing Nose: No congestion, no discharge Mouth: Oral mucosa moist, pink, clear.  Tongue midline.  Dentures in place. Neck: No JVD, no masses, no thyromegaly. Lungs: Clear bilaterally.  No labored breathing or cough. Heart: RRR.  No MRG.  S1, S2 present Abdomen: Soft.  Not tender or distended.  No masses.  No organomegaly.  No bruits, no hernias.  Bowel sounds active..   Rectal: Not performed Musc/Skeltl: No joint redness, swelling.  Contracture at the elbow on left arm, unable to straighten it. Extremities: No CCE. Neurologic: Left upper and lower extremity weakness.  No tremor.  Oriented x 3.   Skin: No rash, no sores, no suspicious lesions. Tattoos: None Nodes: No cervical or inguinal adenopathy. Psych: [place], speech clear.   Intake/Output from previous day: 06/23 0701 - 06/24 0700 In: 1286.4 [I.V.:373.4; Blood:913] Out: -  Intake/Output this shift: No intake/output data recorded.  LAB RESULTS: Recent Labs    10/15/18 1212 10/15/18 1225 10/16/18 0453  WBC 6.8 7.6 7.1  HGB 6.6* 6.4* 8.8*  HCT 22.7* 24.6* 29.6*  PLT  --  328 273   BMET Lab Results  Component Value Date   NA 139 10/16/2018   NA 140 10/15/2018   NA 139 09/30/2018   K 3.6 10/16/2018   K 3.9 10/15/2018   K 4.3 09/30/2018   CL 107 10/16/2018   CL 107 10/15/2018   CL 103 09/30/2018   CO2 25 10/16/2018   CO2 25 10/15/2018   CO2 23 09/30/2018   GLUCOSE 172 (H)  10/16/2018   GLUCOSE 168 (H) 10/15/2018   GLUCOSE 195 (H) 09/30/2018   BUN 10 10/16/2018   BUN 15 10/15/2018   BUN 10 09/30/2018   CREATININE 0.67 10/16/2018   CREATININE 0.75 10/15/2018   CREATININE 0.63 09/30/2018   CALCIUM 8.8 (L) 10/16/2018   CALCIUM 9.1 10/15/2018   CALCIUM 9.2 09/30/2018   LFT Recent Labs    10/15/18 1225  PROT 6.2*  ALBUMIN 3.0*  AST 19  ALT 13  ALKPHOS 82  BILITOT 0.5   PT/INR Lab Results  Component Value Date   INR 1.1 10/16/2018   Hepatitis Panel No results for input(s): HEPBSAG, HCVAB, HEPAIGM, HEPBIGM in the last 72 hours. C-Diff No components found for: CDIFF Lipase  No results found for: LIPASE  Drugs of Abuse  No results found for: LABOPIA, COCAINSCRNUR, LABBENZ, AMPHETMU, THCU, LABBARB   RADIOLOGY STUDIES: Ct Abdomen Pelvis Wo Contrast  Result Date: 10/16/2018 CLINICAL DATA:  Anemia, unexplained, post transfusion EXAM: CT ABDOMEN AND PELVIS WITHOUT CONTRAST TECHNIQUE: Multidetector CT imaging of the abdomen and pelvis was performed following the standard protocol without IV contrast. COMPARISON:  CT abdomen pelvis, 11/15/2006 FINDINGS: Lower chest: Trace bilateral pleural effusions and associated atelectasis or consolidation Hepatobiliary: There is a large, hypodense mass of the right lobe of the liver measuring at least 5.9 x 5.0 cm (series 3, image 27). Status post cholecystic Pancreas: Unremarkable. No pancreatic ductal dilatation or surrounding inflammatory changes. Spleen: Normal in size. Fluid attenuation cystic lesion of the spleen not significantly changed compared to prior examination dated 2000. Adrenals/Urinary Tract: Adrenal glands are unremarkable. Kidneys are normal, without renal calculi, solid lesion, or hydronephrosis. Bladder is unremarkable. Stomach/Bowel: Stomach is within normal limits. Appendix appears normal. There is a large, circumferential mass of the proximal cecum just above ileocecal valve (series 3, image 55,  series 6, image 39). Vascular/Lymphatic: Aortic atherosclerosis. There are enlarged lymph nodes of the right mesocolon measuring up to 2.1 cm (series 3, image 47). Reproductive: Status post hysterectomy. Other: No abdominal wall hernia or abnormality. No abdominopelvic ascites. Musculoskeletal: No acute or significant osseous findings. IMPRESSION: 1. There is a large, circumferential mass of the proximal cecum just above ileocecal valve (series 3, image 55, series 6, image 39). There are enlarged lymph nodes of the right mesocolon measuring up to 2.1 cm (series 3, image 47). Findings are consistent with primary colon malignancy. 2. There is a large, hypodense mass of the right lobe of the liver measuring at least 5.9 x 5.0 cm (series 3, image 27). This is new in comparison to remote prior CT dated 2008 and concerning for metastatic lesion. 3. Other chronic, incidental, and postoperative findings as detailed above. Electronically Signed   By: Eddie Candle M.D.   On: 10/16/2018 11:24      IMPRESSION:   *    Cecal mass.  Local adenopathy as well as right liver lesion consistent with metastatic disease. Unremarkable colonoscopy 2007.  Patient declined EGD/colonoscopy 2018 for work-up of IDA.  *    Iron deficiency anemia (IDA).  Symptomatic. Good response to 2 U PRBCs.    PLAN:     *   At present both the daughter and the patient are not inclined to pursue immediate colonoscopy.  Have not been able to persuade either of them to consent to patient undergoing COVID testing and or colonoscopy. Their reasoning at this point is to have the patient return home and discuss the situation with medical people within their family and come to a decision. Certainly this is not ideal and will delay diagnosis as well as complicates arranging procedures such as colonoscopy. As an alternative, would liver biopsy give [place] the diagnosis we need which would allow for referral to oncology?  This may work temporarily but  the colon mass will imminently become obstructive would likely require hospitalization and intervention.  I am not sure patient/daughter would consent to a liver biopsy but may be a possibility worth pursuing.  *   Given the patient's refusal to undergo any testing at this point, have advanced her diet to carb modified.    Azucena Freed  10/16/2018, 12:41 PM Phone [phone]    Attending physician's note   I have taken an interval history, reviewed the chart and examined the patient. I agree with the Advanced Practitioner's note, impression and recommendations.   Discussed extensively with the pt and patient's daughter.  Have also shown CT scan films.  Likely cecal mass with mets in the liver. IDA, s/p 2U PRBC  Covid-19 neg (finally got it done)  Plan: -Prep tomorrow for colonoscopy 6/26 (Friday). -Check CEA level. -Trend CBC   Carmell Austria, MD [facility] [phone].

## 2018-10-17 DIAGNOSIS — K219 Gastro-esophageal reflux disease without esophagitis: Secondary | ICD-10-CM | POA: Diagnosis present

## 2018-10-17 DIAGNOSIS — Z885 Allergy status to narcotic agent status: Secondary | ICD-10-CM | POA: Diagnosis not present

## 2018-10-17 DIAGNOSIS — R160 Hepatomegaly, not elsewhere classified: Secondary | ICD-10-CM

## 2018-10-17 DIAGNOSIS — Z79899 Other long term (current) drug therapy: Secondary | ICD-10-CM | POA: Diagnosis not present

## 2018-10-17 DIAGNOSIS — K621 Rectal polyp: Secondary | ICD-10-CM | POA: Diagnosis not present

## 2018-10-17 DIAGNOSIS — D500 Iron deficiency anemia secondary to blood loss (chronic): Secondary | ICD-10-CM | POA: Diagnosis not present

## 2018-10-17 DIAGNOSIS — D649 Anemia, unspecified: Secondary | ICD-10-CM | POA: Diagnosis not present

## 2018-10-17 DIAGNOSIS — E119 Type 2 diabetes mellitus without complications: Secondary | ICD-10-CM | POA: Diagnosis not present

## 2018-10-17 DIAGNOSIS — Z888 Allergy status to other drugs, medicaments and biological substances status: Secondary | ICD-10-CM | POA: Diagnosis not present

## 2018-10-17 DIAGNOSIS — C182 Malignant neoplasm of ascending colon: Secondary | ICD-10-CM | POA: Diagnosis not present

## 2018-10-17 DIAGNOSIS — Z87891 Personal history of nicotine dependence: Secondary | ICD-10-CM | POA: Diagnosis not present

## 2018-10-17 DIAGNOSIS — K6389 Other specified diseases of intestine: Secondary | ICD-10-CM

## 2018-10-17 DIAGNOSIS — D734 Cyst of spleen: Secondary | ICD-10-CM | POA: Diagnosis present

## 2018-10-17 DIAGNOSIS — C189 Malignant neoplasm of colon, unspecified: Secondary | ICD-10-CM | POA: Diagnosis not present

## 2018-10-17 DIAGNOSIS — J449 Chronic obstructive pulmonary disease, unspecified: Secondary | ICD-10-CM | POA: Diagnosis not present

## 2018-10-17 DIAGNOSIS — Z1159 Encounter for screening for other viral diseases: Secondary | ICD-10-CM | POA: Diagnosis not present

## 2018-10-17 DIAGNOSIS — K648 Other hemorrhoids: Secondary | ICD-10-CM | POA: Diagnosis not present

## 2018-10-17 DIAGNOSIS — Z8249 Family history of ischemic heart disease and other diseases of the circulatory system: Secondary | ICD-10-CM | POA: Diagnosis not present

## 2018-10-17 DIAGNOSIS — M48061 Spinal stenosis, lumbar region without neurogenic claudication: Secondary | ICD-10-CM | POA: Diagnosis present

## 2018-10-17 DIAGNOSIS — D509 Iron deficiency anemia, unspecified: Secondary | ICD-10-CM | POA: Diagnosis not present

## 2018-10-17 DIAGNOSIS — Z886 Allergy status to analgesic agent status: Secondary | ICD-10-CM | POA: Diagnosis not present

## 2018-10-17 DIAGNOSIS — D128 Benign neoplasm of rectum: Secondary | ICD-10-CM | POA: Diagnosis not present

## 2018-10-17 DIAGNOSIS — R609 Edema, unspecified: Secondary | ICD-10-CM | POA: Diagnosis present

## 2018-10-17 DIAGNOSIS — I69354 Hemiplegia and hemiparesis following cerebral infarction affecting left non-dominant side: Secondary | ICD-10-CM | POA: Diagnosis not present

## 2018-10-17 DIAGNOSIS — Z7982 Long term (current) use of aspirin: Secondary | ICD-10-CM | POA: Diagnosis not present

## 2018-10-17 DIAGNOSIS — I10 Essential (primary) hypertension: Secondary | ICD-10-CM | POA: Diagnosis not present

## 2018-10-17 DIAGNOSIS — Z9049 Acquired absence of other specified parts of digestive tract: Secondary | ICD-10-CM | POA: Diagnosis not present

## 2018-10-17 DIAGNOSIS — C787 Secondary malignant neoplasm of liver and intrahepatic bile duct: Secondary | ICD-10-CM | POA: Diagnosis not present

## 2018-10-17 DIAGNOSIS — E785 Hyperlipidemia, unspecified: Secondary | ICD-10-CM | POA: Diagnosis present

## 2018-10-17 DIAGNOSIS — D120 Benign neoplasm of cecum: Secondary | ICD-10-CM | POA: Diagnosis not present

## 2018-10-17 DIAGNOSIS — Z90710 Acquired absence of both cervix and uterus: Secondary | ICD-10-CM | POA: Diagnosis not present

## 2018-10-17 LAB — COMPREHENSIVE METABOLIC PANEL
ALT: 12 U/L (ref 0–44)
AST: 17 U/L (ref 15–41)
Albumin: 2.7 g/dL — ABNORMAL LOW (ref 3.5–5.0)
Alkaline Phosphatase: 74 U/L (ref 38–126)
Anion gap: 8 (ref 5–15)
BUN: 11 mg/dL (ref 8–23)
CO2: 23 mmol/L (ref 22–32)
Calcium: 8.7 mg/dL — ABNORMAL LOW (ref 8.9–10.3)
Chloride: 108 mmol/L (ref 98–111)
Creatinine, Ser: 0.62 mg/dL (ref 0.44–1.00)
GFR calc Af Amer: >60 mL/min (ref >60)
GFR calc non Af Amer: >60 mL/min (ref >60)
Glucose, Bld: 187 mg/dL — ABNORMAL HIGH (ref 70–99)
Potassium: 3.5 mmol/L (ref 3.5–5.1)
Sodium: 139 mmol/L (ref 135–145)
Total Bilirubin: 1 mg/dL (ref 0.3–1.2)
Total Protein: 5.8 g/dL — ABNORMAL LOW (ref 6.5–8.1)

## 2018-10-17 LAB — CBC
HCT: 30.1 % — ABNORMAL LOW (ref 36.0–46.0)
Hemoglobin: 8.8 g/dL — ABNORMAL LOW (ref 12.0–15.0)
MCH: 19.3 pg — ABNORMAL LOW (ref 26.0–34.0)
MCHC: 29.2 g/dL — ABNORMAL LOW (ref 30.0–36.0)
MCV: 65.9 fL — ABNORMAL LOW (ref 80.0–100.0)
Platelets: 275 10*3/uL (ref 150–400)
RBC: 4.57 MIL/uL (ref 3.87–5.11)
RDW: 23.6 % — ABNORMAL HIGH (ref 11.5–15.5)
WBC: 7.4 10*3/uL (ref 4.0–10.5)
nRBC: 0 % (ref 0.0–0.2)

## 2018-10-17 LAB — GLUCOSE, CAPILLARY
Glucose-Capillary: 184 mg/dL — ABNORMAL HIGH (ref 70–99)
Glucose-Capillary: 183 mg/dL — ABNORMAL HIGH (ref 70–99)
Glucose-Capillary: 124 mg/dL — ABNORMAL HIGH (ref 70–99)

## 2018-10-17 MED ORDER — POLYETHYLENE GLYCOL 3350 17 GM/SCOOP PO POWD
1.0000 | Freq: Once | ORAL | Status: AC
  Administered 2018-10-17: 255 g via ORAL
  Filled 2018-10-17: qty 255

## 2018-10-17 MED ORDER — INSULIN ASPART 100 UNIT/ML ~~LOC~~ SOLN: 0.0000 [IU] | Freq: Three times a day (TID) | SUBCUTANEOUS | Status: DC

## 2018-10-17 MED ORDER — METOCLOPRAMIDE HCL 5 MG/ML IJ SOLN
10.0000 mg | Freq: Once | INTRAMUSCULAR | Status: DC
  Filled 2018-10-17: qty 2

## 2018-10-17 MED ORDER — BISACODYL 5 MG PO TBEC
20.0000 mg | DELAYED_RELEASE_TABLET | Freq: Once | ORAL | Status: AC
  Administered 2018-10-17: 20 mg via ORAL
  Filled 2018-10-17: qty 4

## 2018-10-17 MED ORDER — METOCLOPRAMIDE HCL 5 MG/ML IJ SOLN
10.0000 mg | Freq: Once | INTRAMUSCULAR | Status: AC
  Administered 2018-10-17: 10 mg via INTRAVENOUS

## 2018-10-17 MED ORDER — INSULIN ASPART 100 UNIT/ML ~~LOC~~ SOLN: 0.0000 [IU] | Freq: Every day | SUBCUTANEOUS | Status: DC

## 2018-10-17 NOTE — H&P (View-Only) (Signed)
Daily Rounding Note  10/17/2018, 8:20 AM  LOS: 1 day   SUBJECTIVE:   Chief complaint: tumor in cecum.  IDA    No issues overnight.  Dtr running the show in terms of allowing vital signs etc.  Covid in house test collected, results pndg.    OBJECTIVE:         Vital signs in last 24 hours:    Temp:  [98.2 F (36.8 C)-99.2 F (37.3 C)] 98.5 F (36.9 C) (06/25 0700) Pulse Rate:  [58-79] 64 (06/25 0700) Resp:  [16-20] 19 (06/25 0700) BP: (140-174)/(69-82) 154/69 (06/25 0700) SpO2:  [96 %-100 %] 99 % (06/25 0700) Last BM Date: 10/15/18 Filed Weights   10/15/18 1340  Weight: 76.2 kg   Pt not reexamined.    Intake/Output from previous day: 06/24 0701 - 06/25 0700 In: 753.6 [I.V.:753.6] Out: -   Intake/Output this shift: No intake/output data recorded.  Lab Results: Recent Labs    10/15/18 1225 10/16/18 0453 10/17/18 0652  WBC 7.6 7.1 7.4  HGB 6.4* 8.8* 8.8*  HCT 24.6* 29.6* 30.1*  PLT 328 273 275   BMET Recent Labs    10/15/18 1225 10/16/18 0453 10/17/18 0652  NA 140 139 139  K 3.9 3.6 3.5  CL 107 107 108  CO2 25 25 23   GLUCOSE 168* 172* 187*  BUN 15 10 11   CREATININE 0.75 0.67 0.62  CALCIUM 9.1 8.8* 8.7*   LFT Recent Labs    10/15/18 1225 10/17/18 0652  PROT 6.2* 5.8*  ALBUMIN 3.0* 2.7*  AST 19 17  ALT 13 12  ALKPHOS 82 74  BILITOT 0.5 1.0   PT/INR Recent Labs    10/16/18 0453  LABPROT 13.9  INR 1.1   Hepatitis Panel No results for input(s): HEPBSAG, HCVAB, HEPAIGM, HEPBIGM in the last 72 hours.  Studies/Results: Ct Abdomen Pelvis Wo Contrast  Result Date: 10/16/2018 CLINICAL DATA:  Anemia, unexplained, post transfusion EXAM: CT ABDOMEN AND PELVIS WITHOUT CONTRAST TECHNIQUE: Multidetector CT imaging of the abdomen and pelvis was performed following the standard protocol without IV contrast. COMPARISON:  CT abdomen pelvis, 11/15/2006 FINDINGS: Lower chest: Trace bilateral pleural  effusions and associated atelectasis or consolidation Hepatobiliary: There is a large, hypodense mass of the right lobe of the liver measuring at least 5.9 x 5.0 cm (series 3, image 27). Status post cholecystic Pancreas: Unremarkable. No pancreatic ductal dilatation or surrounding inflammatory changes. Spleen: Normal in size. Fluid attenuation cystic lesion of the spleen not significantly changed compared to prior examination dated 2000. Adrenals/Urinary Tract: Adrenal glands are unremarkable. Kidneys are normal, without renal calculi, solid lesion, or hydronephrosis. Bladder is unremarkable. Stomach/Bowel: Stomach is within normal limits. Appendix appears normal. There is a large, circumferential mass of the proximal cecum just above ileocecal valve (series 3, image 55, series 6, image 39). Vascular/Lymphatic: Aortic atherosclerosis. There are enlarged lymph nodes of the right mesocolon measuring up to 2.1 cm (series 3, image 47). Reproductive: Status post hysterectomy. Other: No abdominal wall hernia or abnormality. No abdominopelvic ascites. Musculoskeletal: No acute or significant osseous findings. IMPRESSION: 1. There is a large, circumferential mass of the proximal cecum just above ileocecal valve (series 3, image 55, series 6, image 39). There are enlarged lymph nodes of the right mesocolon measuring up to 2.1 cm (series 3, image 47). Findings are consistent with primary colon malignancy. 2. There is a large, hypodense mass of the right lobe of the liver measuring at least  5.9 x 5.0 cm (series 3, image 27). This is new in comparison to remote prior CT dated 2008 and concerning for metastatic lesion. 3. Other chronic, incidental, and postoperative findings as detailed above. Electronically Signed   By: Eddie Candle M.D.   On: 10/16/2018 11:24    ASSESMENT:   *  Likely malignant mass at cecum with likely liver mets.   Pt and dtr now agreeable to colonoscopy on 6/26, this is set for ~ 0815/0830 tmrw.     *   IDA.  FOBT negative on 1 of 1 specs.  S/p 2 PRBCs with adequate, sustained rise in Hgb.    *   DM2.  Sugars in 170s, 180s.  Receiving Tradjenta.     PLAN   *   See prep orders for split dose /Dulcolax/miralax/gatorade/Metoclopramide prep which is likely to be best tolerated.   Switch to clear liquids.    *  Await Covid test and CEA.    *   Consider dosing with Feraheme.    *   ? Hold Tradgenta today in light of clear diet and bowel prep today, though NPI will not start til 0400 tmrw.  Messaged attending with this question.      Azucena Freed  10/17/2018, 8:20 AM Phone [phone]    Attending physician's note   I have taken an interval history, reviewed the chart and examined the patient. I agree with the Advanced Practitioner's note, impression and recommendations.  Discussed with patient's daughter.  Being prepped for colonoscopy in a.m.  I have discussed risks and benefits.  Carmell Austria, MD [facility] [phone].

## 2018-10-17 NOTE — Progress Notes (Addendum)
PROGRESS NOTE    Tonya [name]  NOM:[id number] DOB: 07/14/1940 DOA: 10/15/2018 PCP: Forrest Moron, MD   Brief Narrative: 78 year old female with history of hypertension, hyperlipidemia, newly diagnosed diabetes, old CVA presented with increased weakness for last 1 month, with anemia hemoglobin 6.6 g and on referral from her PCP.  As per GI Dr Lyndel Safe seh had screening colonoscopy done in October 2007 recommended repeat work-up in 10 years, and was sent letter in 2017 for repeat colonoscopy and there was no response from the patient.  Patient declined EGD/colonoscopy when she was seen in GI office in February/2018 regarding iron deficiency anemia.  In the ER hemoglobin was 6.4 g, switched to Ancef PRBC, was admitted.  And hemoglobin has improved. Work-up 6/24 with CT scan showed large cecal mass with adjacent lymphadenopathy, consistent with primary: Malignancy and a large mass in the right lobe of the liver new and concerning for metastasis. Seen by gastroenterology and planning for colonoscopy 6/26.  Subjective: Seen this morning resting comfortably.  No new complaints.  Daughter at the bedside.  Denies nausea vomiting chest pain.  Assessment & Plan:   Symptomatic anemia secondary to colon mass  with hemoglobin 6.4 status post 2 unit PRBC and hemoglobin has improved. monitor Recent Labs  Lab 10/15/18 1212 10/15/18 1225 10/16/18 0453 10/17/18 0652  HGB 6.6* 6.4* 8.8* 8.8*  HCT 22.7* 24.6* 29.6* 30.1*   Newl diagnosed Cecal mass with possible liver metastasis. See in CT scan .GI o/b and pPlanning for colonoscopy and biopsy tomorrow.  Continue on clears.  History of CVA with left-sided weakness:aspirin on hold. cont Lipitor  HLD- Cont statin  Diabetes mellitus on Januvia. At goal. Hold p.o. meds- on Januvia- was on Tradjenta inhouse- stopped for procedure/po w clears. Start sliding scale insulin.Patient refusing insulin.  Hypertension :BP higher side, acceptable, continue home  amlodipine 10, Lopressor 12.5 mg, HCTZ  And monitor.  Apparently patient refused COVID 21- and then agreed later and was done 6/24 pm and is NEGATIVE.  DVT prophylaxis: SCD.Heparin/lovenox-contraindicated. Code Status: Full code Family Communication: Discussed with the daughter at the bedside. Disposition Plan: Patient will need to stay in-house at least 2 midnights, will get further work-up with colonoscopy Friday morning after intensive bowel preparation starting today light of colon mass   Consultants:  GI Procedures:  CT abdomen pelvis w/o  There is a large, circumferential mass of the proximal cecum just above ileocecal valve (series 3, image 55, series 6, image 39). There are enlarged lymph nodes of the right mesocolon measuring up to 2.1 cm (series 3, image 47). Findings are consistent with primary colon malignancy. 2. There is a large, hypodense mass of the right lobe of the liver measuring at least 5.9 x 5.0 cm (series 3, image 27). This is new in comparison to remote prior CT dated 2008 and concerning for metastatic lesion.  Antimicrobials: Anti-infectives (From admission, onward)   None       Objective: Vitals:   10/16/18 1535 10/16/18 1928 10/17/18 0650 10/17/18 0700  BP: (!) 168/82 140/74 (!) 156/73 (!) 154/69  Pulse: (!) 58 79 72 64  Resp: 20 19 20 19   Temp: 98.2 F (36.8 C) 99.2 F (37.3 C) 98.4 F (36.9 C) 98.5 F (36.9 C)  TempSrc: Oral  Oral Axillary  SpO2: 99% 100% 96% 99%  Weight:      Height:        Intake/Output Summary (Last 24 hours) at 10/17/2018 1136 Last data filed at 10/16/2018 2337 Gross  per 24 hour  Intake 753.6 ml  Output --  Net 753.6 ml   Filed Weights   10/15/18 1340  Weight: 76.2 kg   Weight change:   Body mass index is 23.43 kg/m.  Intake/Output from previous day: 06/24 0701 - 06/25 0700 In: 753.6 [I.V.:753.6] Out: -  Intake/Output this shift: No intake/output data recorded.  Examination:  General exam: Appears calm  and comfortable, thin, frail elderly.  HEENT:PERRL,Oral mucosa moist, Ear/Nose normal on gross exam Respiratory system: Bilateral equal air entry, normal vesicular breath sounds, no wheezes or crackles  Cardiovascular system: S1 & S2 heard,No JVD, murmurs. Gastrointestinal system: Abdomen is  soft, non tender, non distended, BS +  Nervous System:Alert and oriented. No focal neurological deficits/moving extremities, sensation intact. Extremities: No edema, no clubbing, distal peripheral pulses palpable. Skin: No rashes, lesions, no icterus MSK: Normal muscle bulk,tone ,power  Medications:  Scheduled Meds:  amLODipine  10 mg Oral Daily   atorvastatin  10 mg Oral q1800   hydrochlorothiazide  25 mg Oral Daily   insulin aspart  0-5 Units Subcutaneous QHS   insulin aspart  0-9 Units Subcutaneous TID WC   metoCLOPramide (REGLAN) injection  10 mg Intravenous Once   Followed by   metoCLOPramide (REGLAN) injection  10 mg Intravenous Once   metoprolol tartrate  12.5 mg Oral BID   polyethylene glycol powder  1 Container Oral Once   sodium chloride flush  3 mL Intravenous Q12H   Continuous Infusions:  sodium chloride     sodium chloride 50 mL/hr at 10/16/18 2337    Data Reviewed: I have personally reviewed following labs and imaging studies  CBC: Recent Labs  Lab 10/15/18 1212 10/15/18 1225 10/16/18 0453 10/17/18 0652  WBC 6.8 7.6 7.1 7.4  HGB 6.6* 6.4* 8.8* 8.8*  HCT 22.7* 24.6* 29.6* 30.1*  MCV 58.2* 62.3* 65.8* 65.9*  PLT  --  328 273 824   Basic Metabolic Panel: Recent Labs  Lab 10/15/18 1225 10/16/18 0453 10/17/18 0652  NA 140 139 139  K 3.9 3.6 3.5  CL 107 107 108  CO2 25 25 23   GLUCOSE 168* 172* 187*  BUN 15 10 11   CREATININE 0.75 0.67 0.62  CALCIUM 9.1 8.8* 8.7*   GFR: Estimated Creatinine Clearance: 62.7 mL/min (by C-G formula based on SCr of 0.62 mg/dL). Liver Function Tests: Recent Labs  Lab 10/15/18 1225 10/17/18 0652  AST 19 17  ALT 13  12  ALKPHOS 82 74  BILITOT 0.5 1.0  PROT 6.2* 5.8*  ALBUMIN 3.0* 2.7*   No results for input(s): LIPASE, AMYLASE in the last 168 hours. No results for input(s): AMMONIA in the last 168 hours. Coagulation Profile: Recent Labs  Lab 10/16/18 0453  INR 1.1   Cardiac Enzymes: No results for input(s): CKTOTAL, CKMB, CKMBINDEX, TROPONINI in the last 168 hours. BNP (last 3 results) No results for input(s): PROBNP in the last 8760 hours. HbA1C: No results for input(s): HGBA1C in the last 72 hours. CBG: Recent Labs  Lab 10/17/18 0750  GLUCAP 184*   Lipid Profile: No results for input(s): CHOL, HDL, LDLCALC, TRIG, CHOLHDL, LDLDIRECT in the last 72 hours. Thyroid Function Tests: No results for input(s): TSH, T4TOTAL, FREET4, T3FREE, THYROIDAB in the last 72 hours. Anemia Panel: No results for input(s): VITAMINB12, FOLATE, FERRITIN, TIBC, IRON, RETICCTPCT in the last 72 hours. Sepsis Labs: No results for input(s): PROCALCITON, LATICACIDVEN in the last 168 hours.  Recent Results (from the past 240 hour(s))  Urine culture  Status: Abnormal   Collection Time: 10/15/18  9:43 PM   Specimen: Urine, Clean Catch  Result Value Ref Range Status   Specimen Description URINE, CLEAN CATCH  Final   Special Requests NONE  Final   Culture (A)  Final    <10,000 COLONIES/mL INSIGNIFICANT GROWTH Performed at [facility], [address] [place], [place] [address]    Report Status 10/16/2018 FINAL  Final  SARS Coronavirus 2 (CEPHEID - Performed in [facility] lab), Hosp Order     Status: None   Collection Time: 10/16/18  3:56 PM   Specimen: Nasopharyngeal Swab  Result Value Ref Range Status   SARS Coronavirus 2 NEGATIVE NEGATIVE Final    Comment: (NOTE) If result is NEGATIVE SARS-CoV-2 target nucleic acids are NOT DETECTED. The SARS-CoV-2 RNA is generally detectable in upper and lower  respiratory specimens during the acute phase of infection. The lowest  concentration of  SARS-CoV-2 viral copies this assay can detect is 250  copies / mL. A negative result does not preclude SARS-CoV-2 infection  and should not be used as the sole basis for treatment or other  patient management decisions.  A negative result may occur with  improper specimen collection / handling, submission of specimen other  than nasopharyngeal swab, presence of viral mutation(s) within the  areas targeted by this assay, and inadequate number of viral copies  (<250 copies / mL). A negative result must be combined with clinical  observations, patient history, and epidemiological information. If result is POSITIVE SARS-CoV-2 target nucleic acids are DETECTED. The SARS-CoV-2 RNA is generally detectable in upper and lower  respiratory specimens dur ing the acute phase of infection.  Positive  results are indicative of active infection with SARS-CoV-2.  Clinical  correlation with patient history and other diagnostic information is  necessary to determine patient infection status.  Positive results do  not rule out bacterial infection or co-infection with other viruses. If result is PRESUMPTIVE POSTIVE SARS-CoV-2 nucleic acids MAY BE PRESENT.   A presumptive positive result was obtained on the submitted specimen  and confirmed on repeat testing.  While 2019 novel coronavirus  (SARS-CoV-2) nucleic acids may be present in the submitted sample  additional confirmatory testing may be necessary for epidemiological  and / or clinical management purposes  to differentiate between  SARS-CoV-2 and other Sarbecovirus currently known to infect humans.  If clinically indicated additional testing with an alternate test  methodology [phone]) is advised. The SARS-CoV-2 RNA is generally  detectable in upper and lower respiratory sp ecimens during the acute  phase of infection. The expected result is Negative. Fact Sheet for Patients:  StrictlyIdeas.no Fact Sheet for Healthcare  Providers: BankingDealers.co.za This test is not yet approved or cleared by the [place] FDA and has been authorized for detection and/or diagnosis of SARS-CoV-2 by FDA under an Emergency Use Authorization (EUA).  This EUA will remain in effect (meaning this test can be used) for the duration of the COVID-19 declaration under Section 564(b)(1) of the Act, 21 U.S.C. section 360bbb-3(b)(1), unless the authorization is terminated or revoked sooner. Performed at [facility], [place] [address], [place], [place] [address]       Radiology Studies: Ct Abdomen Pelvis Wo Contrast  Result Date: 10/16/2018 CLINICAL DATA:  Anemia, unexplained, post transfusion EXAM: CT ABDOMEN AND PELVIS WITHOUT CONTRAST TECHNIQUE: Multidetector CT imaging of the abdomen and pelvis was performed following the standard protocol without IV contrast. COMPARISON:  CT abdomen pelvis, 11/15/2006  FINDINGS: Lower chest: Trace bilateral pleural effusions and associated atelectasis or consolidation Hepatobiliary: There is a large, hypodense mass of the right lobe of the liver measuring at least 5.9 x 5.0 cm (series 3, image 27). Status post cholecystic Pancreas: Unremarkable. No pancreatic ductal dilatation or surrounding inflammatory changes. Spleen: Normal in size. Fluid attenuation cystic lesion of the spleen not significantly changed compared to prior examination dated 2000. Adrenals/Urinary Tract: Adrenal glands are unremarkable. Kidneys are normal, without renal calculi, solid lesion, or hydronephrosis. Bladder is unremarkable. Stomach/Bowel: Stomach is within normal limits. Appendix appears normal. There is a large, circumferential mass of the proximal cecum just above ileocecal valve (series 3, image 55, series 6, image 39). Vascular/Lymphatic: Aortic atherosclerosis. There are enlarged lymph nodes of the right mesocolon measuring up to 2.1 cm (series 3, image 47). Reproductive: Status post  hysterectomy. Other: No abdominal wall hernia or abnormality. No abdominopelvic ascites. Musculoskeletal: No acute or significant osseous findings. IMPRESSION: 1. There is a large, circumferential mass of the proximal cecum just above ileocecal valve (series 3, image 55, series 6, image 39). There are enlarged lymph nodes of the right mesocolon measuring up to 2.1 cm (series 3, image 47). Findings are consistent with primary colon malignancy. 2. There is a large, hypodense mass of the right lobe of the liver measuring at least 5.9 x 5.0 cm (series 3, image 27). This is new in comparison to remote prior CT dated 2008 and concerning for metastatic lesion. 3. Other chronic, incidental, and postoperative findings as detailed above. Electronically Signed   By: Eddie Candle M.D.   On: 10/16/2018 11:24      LOS: 1 day   Time spent: More than 50% of that time was spent in counseling and/or coordination of care.  Antonieta Pert, MD [facility]  10/17/2018, 11:36 AM

## 2018-10-17 NOTE — Progress Notes (Addendum)
Daily Rounding Note  10/17/2018, 8:20 AM  LOS: 1 day   SUBJECTIVE:   Chief complaint: tumor in cecum.  IDA    No issues overnight.  Dtr running the show in terms of allowing vital signs etc.  Covid in house test collected, results pndg.    OBJECTIVE:         Vital signs in last 24 hours:    Temp:  [98.2 F (36.8 C)-99.2 F (37.3 C)] 98.5 F (36.9 C) (06/25 0700) Pulse Rate:  [58-79] 64 (06/25 0700) Resp:  [16-20] 19 (06/25 0700) BP: (140-174)/(69-82) 154/69 (06/25 0700) SpO2:  [96 %-100 %] 99 % (06/25 0700) Last BM Date: 10/15/18 Filed Weights   10/15/18 1340  Weight: 76.2 kg   Pt not reexamined.    Intake/Output from previous day: 06/24 0701 - 06/25 0700 In: 753.6 [I.V.:753.6] Out: -   Intake/Output this shift: No intake/output data recorded.  Lab Results: Recent Labs    10/15/18 1225 10/16/18 0453 10/17/18 0652  WBC 7.6 7.1 7.4  HGB 6.4* 8.8* 8.8*  HCT 24.6* 29.6* 30.1*  PLT 328 273 275   BMET Recent Labs    10/15/18 1225 10/16/18 0453 10/17/18 0652  NA 140 139 139  K 3.9 3.6 3.5  CL 107 107 108  CO2 25 25 23   GLUCOSE 168* 172* 187*  BUN 15 10 11   CREATININE 0.75 0.67 0.62  CALCIUM 9.1 8.8* 8.7*   LFT Recent Labs    10/15/18 1225 10/17/18 0652  PROT 6.2* 5.8*  ALBUMIN 3.0* 2.7*  AST 19 17  ALT 13 12  ALKPHOS 82 74  BILITOT 0.5 1.0   PT/INR Recent Labs    10/16/18 0453  LABPROT 13.9  INR 1.1   Hepatitis Panel No results for input(s): HEPBSAG, HCVAB, HEPAIGM, HEPBIGM in the last 72 hours.  Studies/Results: Ct Abdomen Pelvis Wo Contrast  Result Date: 10/16/2018 CLINICAL DATA:  Anemia, unexplained, post transfusion EXAM: CT ABDOMEN AND PELVIS WITHOUT CONTRAST TECHNIQUE: Multidetector CT imaging of the abdomen and pelvis was performed following the standard protocol without IV contrast. COMPARISON:  CT abdomen pelvis, 11/15/2006 FINDINGS: Lower chest: Trace bilateral pleural  effusions and associated atelectasis or consolidation Hepatobiliary: There is a large, hypodense mass of the right lobe of the liver measuring at least 5.9 x 5.0 cm (series 3, image 27). Status post cholecystic Pancreas: Unremarkable. No pancreatic ductal dilatation or surrounding inflammatory changes. Spleen: Normal in size. Fluid attenuation cystic lesion of the spleen not significantly changed compared to prior examination dated 2000. Adrenals/Urinary Tract: Adrenal glands are unremarkable. Kidneys are normal, without renal calculi, solid lesion, or hydronephrosis. Bladder is unremarkable. Stomach/Bowel: Stomach is within normal limits. Appendix appears normal. There is a large, circumferential mass of the proximal cecum just above ileocecal valve (series 3, image 55, series 6, image 39). Vascular/Lymphatic: Aortic atherosclerosis. There are enlarged lymph nodes of the right mesocolon measuring up to 2.1 cm (series 3, image 47). Reproductive: Status post hysterectomy. Other: No abdominal wall hernia or abnormality. No abdominopelvic ascites. Musculoskeletal: No acute or significant osseous findings. IMPRESSION: 1. There is a large, circumferential mass of the proximal cecum just above ileocecal valve (series 3, image 55, series 6, image 39). There are enlarged lymph nodes of the right mesocolon measuring up to 2.1 cm (series 3, image 47). Findings are consistent with primary colon malignancy. 2. There is a large, hypodense mass of the right lobe of the liver measuring at least  5.9 x 5.0 cm (series 3, image 27). This is new in comparison to remote prior CT dated 2008 and concerning for metastatic lesion. 3. Other chronic, incidental, and postoperative findings as detailed above. Electronically Signed   By: Eddie Candle M.D.   On: 10/16/2018 11:24    ASSESMENT:   *  Likely malignant mass at cecum with likely liver mets.   Pt and dtr now agreeable to colonoscopy on 6/26, this is set for ~ 0815/0830 tmrw.     *   IDA.  FOBT negative on 1 of 1 specs.  S/p 2 PRBCs with adequate, sustained rise in Hgb.    *   DM2.  Sugars in 170s, 180s.  Receiving Tradjenta.     PLAN   *   See prep orders for split dose /Dulcolax/miralax/gatorade/Metoclopramide prep which is likely to be best tolerated.   Switch to clear liquids.    *  Await Covid test and CEA.    *   Consider dosing with Feraheme.    *   ? Hold Tradgenta today in light of clear diet and bowel prep today, though NPI will not start til 0400 tmrw.  Messaged attending with this question.      Azucena Freed  10/17/2018, 8:20 AM Phone [phone]    Attending physician's note   I have taken an interval history, reviewed the chart and examined the patient. I agree with the Advanced Practitioner's note, impression and recommendations.  Discussed with patient's daughter.  Being prepped for colonoscopy in a.m.  I have discussed risks and benefits.  Carmell Austria, MD [facility] [phone].

## 2018-10-18 ENCOUNTER — Inpatient Hospital Stay (HOSPITAL_COMMUNITY): Payer: Medicare HMO | Admitting: Anesthesiology

## 2018-10-18 ENCOUNTER — Encounter (HOSPITAL_COMMUNITY)
Admission: EM | Disposition: A | Discharge status: Home Health | Payer: Self-pay | Source: Home / Self Care | Attending: Internal Medicine

## 2018-10-18 ENCOUNTER — Inpatient Hospital Stay (HOSPITAL_COMMUNITY): Payer: Medicare HMO

## 2018-10-18 ENCOUNTER — Encounter (HOSPITAL_COMMUNITY): Payer: Self-pay

## 2018-10-18 DIAGNOSIS — K621 Rectal polyp: Secondary | ICD-10-CM

## 2018-10-18 DIAGNOSIS — C787 Secondary malignant neoplasm of liver and intrahepatic bile duct: Secondary | ICD-10-CM

## 2018-10-18 DIAGNOSIS — I10 Essential (primary) hypertension: Secondary | ICD-10-CM

## 2018-10-18 DIAGNOSIS — C189 Malignant neoplasm of colon, unspecified: Secondary | ICD-10-CM

## 2018-10-18 HISTORY — PX: COLONOSCOPY WITH PROPOFOL: SHX5780

## 2018-10-18 HISTORY — PX: BIOPSY: SHX5522

## 2018-10-18 HISTORY — PX: SUBMUCOSAL TATTOO INJECTION: SHX6856

## 2018-10-18 HISTORY — PX: POLYPECTOMY: SHX5525

## 2018-10-18 LAB — CBC
HCT: 35.8 % — ABNORMAL LOW (ref 36.0–46.0)
Hemoglobin: 10.3 g/dL — ABNORMAL LOW (ref 12.0–15.0)
MCH: 19.2 pg — ABNORMAL LOW (ref 26.0–34.0)
MCHC: 28.8 g/dL — ABNORMAL LOW (ref 30.0–36.0)
MCV: 66.8 fL — ABNORMAL LOW (ref 80.0–100.0)
Platelets: 313 10*3/uL (ref 150–400)
RBC: 5.36 MIL/uL — ABNORMAL HIGH (ref 3.87–5.11)
RDW: 25 % — ABNORMAL HIGH (ref 11.5–15.5)
WBC: 8.7 10*3/uL (ref 4.0–10.5)
nRBC: 0 % (ref 0.0–0.2)

## 2018-10-18 LAB — GLUCOSE, CAPILLARY
Glucose-Capillary: 242 mg/dL — ABNORMAL HIGH (ref 70–99)
Glucose-Capillary: 163 mg/dL — ABNORMAL HIGH (ref 70–99)
Glucose-Capillary: 220 mg/dL — ABNORMAL HIGH (ref 70–99)

## 2018-10-18 LAB — CEA: CEA: 4.3 ng/mL (ref 0.0–4.7)

## 2018-10-18 SURGERY — COLONOSCOPY WITH PROPOFOL
Anesthesia: Monitor Anesthesia Care

## 2018-10-18 MED ORDER — LACTATED RINGERS IV SOLN
INTRAVENOUS | Status: DC
  Administered 2018-10-18: 08:00:00 via INTRAVENOUS

## 2018-10-18 MED ORDER — SPOT INK MARKER SYRINGE KIT
PACK | SUBMUCOSAL | Status: DC | PRN
  Administered 2018-10-18: 4 mL via SUBMUCOSAL

## 2018-10-18 MED ORDER — PROPOFOL 10 MG/ML IV BOLUS
INTRAVENOUS | Status: DC | PRN
  Administered 2018-10-18: 20 mg via INTRAVENOUS

## 2018-10-18 MED ORDER — SPOT INK MARKER SYRINGE KIT
PACK | SUBMUCOSAL | Status: AC
  Filled 2018-10-18: qty 5

## 2018-10-18 MED ORDER — PROPOFOL 500 MG/50ML IV EMUL
INTRAVENOUS | Status: DC | PRN
  Administered 2018-10-18: 50 ug/kg/min via INTRAVENOUS

## 2018-10-18 SURGICAL SUPPLY — 25 items

## 2018-10-18 NOTE — Anesthesia Postprocedure Evaluation (Signed)
Anesthesia Post Note  Patient: Tonya [name]  Procedure(s) Performed: COLONOSCOPY WITH PROPOFOL (N/A ) SUBMUCOSAL TATTOO INJECTION BIOPSY POLYPECTOMY     Patient location during evaluation: PACU Anesthesia Type: MAC Level of consciousness: awake and alert Pain management: pain level controlled Vital Signs Assessment: post-procedure vital signs reviewed and stable Respiratory status: spontaneous breathing, nonlabored ventilation, respiratory function stable and patient connected to nasal cannula oxygen Cardiovascular status: blood pressure returned to baseline and stable Postop Assessment: no apparent nausea or vomiting Anesthetic complications: no    Last Vitals:  Vitals:   10/18/18 0920 10/18/18 1118  BP: 126/63 (!) 135/59  Pulse: 60 100  Resp: 19 17  Temp:  36.8 C  SpO2: 99% 100%    Last Pain:  Vitals:   10/18/18 1118  TempSrc: Axillary  PainSc:                  Nyaja [place] COKER

## 2018-10-18 NOTE — Discharge Summary (Signed)
Physician Discharge Summary  Tonya [name] XFG:[id number] DOB: 1940-05-27 DOA: 10/15/2018  PCP: Forrest Moron, MD  Admit date: 10/15/2018 Discharge date: 10/18/2018  Admitted From: Home  Disposition:  Home   Recommendations for Outpatient Follow-up and new medication changes:  1. Follow up with Dr. Marin Olp from Oncology as scheduled.  2. Follow up with Dr Lyndel Safe from GI next week for biopsy results.  3. Pending CT chest report.  4. CEA 4.3   Home Health: no  Equipment/Devices: no    Discharge Condition: stable  CODE STATUS: full  Diet recommendation: Heart healthy   Brief/Interim Summary: 78 yo female who presented with worsening weakness.  She does have significant past medical history for hypertension, dyslipidemia, type 2 diabetes mellitus and history of CVA.  Patient reported progressive and worsening generalized weakness, outpatient work-up revealed hemoglobin of 6.6, and she was sent to the emergency room for evaluation. On her initial physical examination blood pressure 185/78, heart rate 70, respiratory rate 20, temperature 98.7.  Her lungs were clear to auscultation bilaterally, heart S1-S2 present and rhythmic, the abdomen was soft nontender, no lower extremity edema.  Sodium 140, potassium 3.9, chloride 107, bicarb 25, glucose 168, BUN 15, creatinine 1.75, white count 7.6, hemoglobin 6.4, hematocrit 24.6, platelets 328, SARS COVID-19 was negative, urinalysis negative for infection.  EKG 81 bpm, left axis deviation, sinus rhythm with normal conduction, poor R wave progression, no ST segment or T wave changes.  Patient was admitted to the hospital with a working diagnosis of symptomatic anemia.  1.  Malignant partially obstructing tumor in the mid ascending colon, complicated by symptomatic anemia.  Patient was admitted to the medical ward, she was placed on a remote telemetry monitor, received 2 units packed red blood cells with good toleration.  Patient underwent further  work-up with CT of the abdomen which showed a large mass of the proximal cecum just above ileocecal valve.  Large hypodense mass of the right lobe of the liver measuring 5.9 x 5.0 cm.  She underwent colonoscopy showing a malignant partial obstructing tumor in the mid ascending colon, biopsies were taken.  CEA 4.3. I spoke with Dr. Lindi Adie from oncology, and will arrange for outpatient follow-up.  Patient declined any surgical intervention at this point, and per discussion with Dr. Lyndel Safe from GI, low risk for obstruction at this point.  Her discharge hemoglobin is 10.3, hematocrit 35.8.  CT chest without contrast for staging was ordered before discharge..  2.  History of CVA with left-sided weakness.  We will continue to hold aspirin, continue Lipitor.  3.  Type 2 diabetes mellitus.  Patient will resume her diabetic regimen at discharge with sitagliptin.   4.  Hypertension.  Continue blood pressure control with amlodipine, metoprolol and hydrochlorothiazide.  Discharge Diagnoses:  Active Problems:   Symptomatic anemia   Colon cancer metastasized to liver [facility])    Discharge Instructions   Allergies as of 10/18/2018      Reactions   Iohexol Hives, Shortness Of Breath, Other (See Comments)   Patient developed hives and fullness in throat post injection of 125cc's Omni 300, Onset Date: 11/15/2006   Hydrocodone Swelling, Other (See Comments)   "I started swelling, became red, and passed out"   Ibuprofen Nausea Only, Other (See Comments)   "It made my stomach hurt"   Naproxen Other (See Comments)   "It made me feel out of my head"   Propoxyphene N-acetaminophen Other (See Comments)   "I couldn't find the door to make  my way out of the room- I was in misery"      Medication List    STOP taking these medications   aspirin 81 MG tablet   olmesartan-hydrochlorothiazide 20-12.5 MG tablet Commonly known as: BENICAR HCT     TAKE these medications   amLODipine 10 MG tablet Commonly known  as: NORVASC Take 1 tablet (10 mg total) by mouth daily.   atorvastatin 10 MG tablet Commonly known as: LIPITOR TAKE 1 TABLET(10 MG) BY MOUTH DAILY What changed:   how much to take  how to take this  when to take this  additional instructions   Cholecalciferol 50 MCG (2000 UT) Tabs Take 1 tablet (2,000 Units total) by mouth daily.   hydrochlorothiazide 25 MG tablet Commonly known as: HYDRODIURIL Take 25 mg by mouth daily.   metoprolol tartrate 25 MG tablet Commonly known as: LOPRESSOR Take 0.5 tablets (12.5 mg total) by mouth 2 (two) times daily.   onetouch ultrasoft lancets Ultra soft smooth 33 gauge. Prick finger 2 times a day to test blood glucose level What changed:   how much to take  how to take this  when to take this  additional instructions   OneTouch Verio test strip Generic drug: glucose blood 1 each by Other route 3 (three) times daily. USE TO TEST BLOOD SUGAR UP TO QID   OneTouch Verio w/Device Kit Prick finger 2 times a day to test blood glucose level   sitaGLIPtin 50 MG tablet Commonly known as: JANUVIA Take 1 tablet (50 mg total) by mouth daily.   Sphygmomanometer Misc 1 Units by Does not apply route every morning.      Follow-up Information    Forrest Moron, MD. Call in 1 day(s).   Specialty: Internal Medicine Why: Please call for a post hospital follow-up appointment. Contact information: [address] [place] [address] [place] [facility] up.   Why: They will coantact you for the first appt. Contact information: [phone]         Allergies  Allergen Reactions  . Iohexol Hives, Shortness Of Breath and Other (See Comments)    Patient developed hives and fullness in throat post injection of 125cc's Omni 300, Onset Date: 11/15/2006   . Hydrocodone Swelling and Other (See Comments)    "I started swelling, became red, and passed out"  . Ibuprofen Nausea Only and Other (See Comments)     "It made my stomach hurt"  . Naproxen Other (See Comments)    "It made me feel out of my head"  . Propoxyphene N-Acetaminophen Other (See Comments)    "I couldn't find the door to make my way out of the room- I was in misery"    Consultations:  GI   Oncology    Procedures/Studies: Ct Abdomen Pelvis Wo Contrast  Result Date: 10/16/2018 CLINICAL DATA:  Anemia, unexplained, post transfusion EXAM: CT ABDOMEN AND PELVIS WITHOUT CONTRAST TECHNIQUE: Multidetector CT imaging of the abdomen and pelvis was performed following the standard protocol without IV contrast. COMPARISON:  CT abdomen pelvis, 11/15/2006 FINDINGS: Lower chest: Trace bilateral pleural effusions and associated atelectasis or consolidation Hepatobiliary: There is a large, hypodense mass of the right lobe of the liver measuring at least 5.9 x 5.0 cm (series 3, image 27). Status post cholecystic Pancreas: Unremarkable. No pancreatic ductal dilatation or surrounding inflammatory changes. Spleen: Normal in size. Fluid attenuation cystic lesion of the spleen not significantly changed compared to prior examination dated  2000. Adrenals/Urinary Tract: Adrenal glands are unremarkable. Kidneys are normal, without renal calculi, solid lesion, or hydronephrosis. Bladder is unremarkable. Stomach/Bowel: Stomach is within normal limits. Appendix appears normal. There is a large, circumferential mass of the proximal cecum just above ileocecal valve (series 3, image 55, series 6, image 39). Vascular/Lymphatic: Aortic atherosclerosis. There are enlarged lymph nodes of the right mesocolon measuring up to 2.1 cm (series 3, image 47). Reproductive: Status post hysterectomy. Other: No abdominal wall hernia or abnormality. No abdominopelvic ascites. Musculoskeletal: No acute or significant osseous findings. IMPRESSION: 1. There is a large, circumferential mass of the proximal cecum just above ileocecal valve (series 3, image 55, series 6, image 39). There are  enlarged lymph nodes of the right mesocolon measuring up to 2.1 cm (series 3, image 47). Findings are consistent with primary colon malignancy. 2. There is a large, hypodense mass of the right lobe of the liver measuring at least 5.9 x 5.0 cm (series 3, image 27). This is new in comparison to remote prior CT dated 2008 and concerning for metastatic lesion. 3. Other chronic, incidental, and postoperative findings as detailed above. Electronically Signed   By: Eddie Candle M.D.   On: 10/16/2018 11:24      Procedures: Colonoscopy with biopsy 06/26  Subjective: Patient is feeling well, no nausea or vomiting, no abdominal pain, no chest pain or dyspnea.   Discharge Exam: Vitals:   10/18/18 0920 10/18/18 1118  BP: 126/63 (!) 135/59  Pulse: 60 100  Resp: 19 17  Temp:  98.2 F (36.8 C)  SpO2: 99% 100%   Vitals:   10/18/18 0740 10/18/18 0903 10/18/18 0920 10/18/18 1118  BP: (!) 157/60 (!) 129/43 126/63 (!) 135/59  Pulse: 68 61 60 100  Resp: (!) _0 Temp: 98.6 F (37 C) 97.7 F (36.5 C)  98.2 F (36.8 C)  TempSrc: Temporal Temporal  Axillary  SpO2: 99% 100% 99% 100%  Weight:      Height:        General: no in pain or dyspnea Neurology: Awake and alert, non focal  E ENT: mild pallor, no icterus, oral mucosa moist Cardiovascular: No JVD. S1-S2 present, rhythmic, no gallops, rubs, or murmurs. No lower extremity edema. Pulmonary: vesicular breath sounds bilaterally, adequate air movement, no wheezing, rhonchi or rales. Gastrointestinal. Abdomen withno organomegaly, non tender, no rebound or guarding Skin. No rashes Musculoskeletal: no joint deformities   The results of significant diagnostics from this hospitalization (including imaging, microbiology, ancillary and laboratory) are listed below for reference.     Microbiology: Recent Results (from the past 240 hour(s))  Urine culture     Status: Abnormal   Collection Time: 10/15/18  9:43 PM   Specimen: Urine, Clean Catch   Result Value Ref Range Status   Specimen Description URINE, CLEAN CATCH  Final   Special Requests NONE  Final   Culture (A)  Final    <10,000 COLONIES/mL INSIGNIFICANT GROWTH Performed at [facility], [address] [place], [place] [address]    Report Status 10/16/2018 FINAL  Final  SARS Coronavirus 2 (CEPHEID - Performed in [place] hospital lab), Hosp Order     Status: None   Collection Time: 10/16/18  3:56 PM   Specimen: Nasopharyngeal Swab  Result Value Ref Range Status   SARS Coronavirus 2 NEGATIVE NEGATIVE Final    Comment: (NOTE) If result is NEGATIVE SARS-CoV-2 target nucleic acids are NOT DETECTED. The SARS-CoV-2 RNA is generally detectable in upper  and lower  respiratory specimens during the acute phase of infection. The lowest  concentration of SARS-CoV-2 viral copies this assay can detect is 250  copies / mL. A negative result does not preclude SARS-CoV-2 infection  and should not be used as the sole basis for treatment or other  patient management decisions.  A negative result may occur with  improper specimen collection / handling, submission of specimen other  than nasopharyngeal swab, presence of viral mutation(s) within the  areas targeted by this assay, and inadequate number of viral copies  (<250 copies / mL). A negative result must be combined with clinical  observations, patient history, and epidemiological information. If result is POSITIVE SARS-CoV-2 target nucleic acids are DETECTED. The SARS-CoV-2 RNA is generally detectable in upper and lower  respiratory specimens dur ing the acute phase of infection.  Positive  results are indicative of active infection with SARS-CoV-2.  Clinical  correlation with patient history and other diagnostic information is  necessary to determine patient infection status.  Positive results do  not rule out bacterial infection or co-infection with other viruses. If result is PRESUMPTIVE POSTIVE SARS-CoV-2 nucleic  acids MAY BE PRESENT.   A presumptive positive result was obtained on the submitted specimen  and confirmed on repeat testing.  While 2019 novel coronavirus  (SARS-CoV-2) nucleic acids may be present in the submitted sample  additional confirmatory testing may be necessary for epidemiological  and / or clinical management purposes  to differentiate between  SARS-CoV-2 and other Sarbecovirus currently known to infect humans.  If clinically indicated additional testing with an alternate test  methodology [phone]) is advised. The SARS-CoV-2 RNA is generally  detectable in upper and lower respiratory sp ecimens during the acute  phase of infection. The expected result is Negative. Fact Sheet for Patients:  StrictlyIdeas.no Fact Sheet for Healthcare Providers: BankingDealers.co.za This test is not yet approved or cleared by the [place] FDA and has been authorized for detection and/or diagnosis of SARS-CoV-2 by FDA under an Emergency Use Authorization (EUA).  This EUA will remain in effect (meaning this test can be used) for the duration of the COVID-19 declaration under Section 564(b)(1) of the Act, 21 U.S.C. section 360bbb-3(b)(1), unless the authorization is terminated or revoked sooner. Performed at [facility], [place] [address] [place], [place] [address]      Labs: BNP (last 3 results) No results for input(s): BNP in the last 8760 hours. Basic Metabolic Panel: Recent Labs  Lab 10/15/18 1225 10/16/18 0453 10/17/18 0652  NA 140 139 139  K 3.9 3.6 3.5  CL 107 107 108  CO2 _0 GLUCOSE 168* 172* 187*  BUN _1 CREATININE 0.75 0.67 0.62  CALCIUM 9.1 8.8* 8.7*   Liver Function Tests: Recent Labs  Lab 10/15/18 1225 10/17/18 0652  AST 19 17  ALT 13 12  ALKPHOS 82 74  BILITOT 0.5 1.0  PROT 6.2* 5.8*  ALBUMIN 3.0* 2.7*   No results for input(s): LIPASE, AMYLASE in the last 168 hours. No results for  input(s): AMMONIA in the last 168 hours. CBC: Recent Labs  Lab 10/15/18 1212 10/15/18 1225 10/16/18 0453 10/17/18 0652 10/18/18 0641  WBC 6.8 7.6 7.1 7.4 8.7  HGB 6.6* 6.4* 8.8* 8.8* 10.3*  HCT 22.7* 24.6* 29.6* 30.1* 35.8*  MCV 58.2* 62.3* 65.8* 65.9* 66.8*  PLT  --  328 273 275 313   Cardiac Enzymes: No results for input(s): CKTOTAL, CKMB, CKMBINDEX, TROPONINI in the  last 168 hours. BNP: Invalid input(s): POCBNP CBG: Recent Labs  Lab 10/17/18 0750 10/17/18 1302 10/17/18 1718 10/17/18 2023 10/18/18 0607  GLUCAP 184* 183* 242* 124* 163*   D-Dimer No results for input(s): DDIMER in the last 72 hours. Hgb A1c No results for input(s): HGBA1C in the last 72 hours. Lipid Profile No results for input(s): CHOL, HDL, LDLCALC, TRIG, CHOLHDL, LDLDIRECT in the last 72 hours. Thyroid function studies No results for input(s): TSH, T4TOTAL, T3FREE, THYROIDAB in the last 72 hours.  Invalid input(s): FREET3 Anemia work up No results for input(s): VITAMINB12, FOLATE, FERRITIN, TIBC, IRON, RETICCTPCT in the last 72 hours. Urinalysis    Component Value Date/Time   COLORURINE STRAW (A) 10/15/2018 2143   APPEARANCEUR CLEAR 10/15/2018 2143   LABSPEC 1.010 10/15/2018 2143   PHURINE 8.0 10/15/2018 2143   GLUCOSEU NEGATIVE 10/15/2018 2143   GLUCOSEU >=1000 (A) 10/10/2017 1216   HGBUR NEGATIVE 10/15/2018 2143   BILIRUBINUR NEGATIVE 10/15/2018 2143   BILIRUBINUR negative (A) 05/31/2018 1712   KETONESUR 5 (A) 10/15/2018 2143   PROTEINUR NEGATIVE 10/15/2018 2143   UROBILINOGEN 1.0 05/31/2018 1712   UROBILINOGEN 1.0 10/10/2017 1216   NITRITE NEGATIVE 10/15/2018 2143   LEUKOCYTESUR NEGATIVE 10/15/2018 2143   Sepsis Labs Invalid input(s): PROCALCITONIN,  WBC,  LACTICIDVEN Microbiology Recent Results (from the past 240 hour(s))  Urine culture     Status: Abnormal   Collection Time: 10/15/18  9:43 PM   Specimen: Urine, Clean Catch  Result Value Ref Range Status   Specimen  Description URINE, CLEAN CATCH  Final   Special Requests NONE  Final   Culture (A)  Final    <10,000 COLONIES/mL INSIGNIFICANT GROWTH Performed at [facility], [place] [address], [place], [place] [address]    Report Status 10/16/2018 FINAL  Final  SARS Coronavirus 2 (CEPHEID - Performed in [place] hospital lab), Hosp Order     Status: None   Collection Time: 10/16/18  3:56 PM   Specimen: Nasopharyngeal Swab  Result Value Ref Range Status   SARS Coronavirus 2 NEGATIVE NEGATIVE Final    Comment: (NOTE) If result is NEGATIVE SARS-CoV-2 target nucleic acids are NOT DETECTED. The SARS-CoV-2 RNA is generally detectable in upper and lower  respiratory specimens during the acute phase of infection. The lowest  concentration of SARS-CoV-2 viral copies this assay can detect is 250  copies / mL. A negative result does not preclude SARS-CoV-2 infection  and should not be used as the sole basis for treatment or other  patient management decisions.  A negative result may occur with  improper specimen collection / handling, submission of specimen other  than nasopharyngeal swab, presence of viral mutation(s) within the  areas targeted by this assay, and inadequate number of viral copies  (<250 copies / mL). A negative result must be combined with clinical  observations, patient history, and epidemiological information. If result is POSITIVE SARS-CoV-2 target nucleic acids are DETECTED. The SARS-CoV-2 RNA is generally detectable in upper and lower  respiratory specimens dur ing the acute phase of infection.  Positive  results are indicative of active infection with SARS-CoV-2.  Clinical  correlation with patient history and other diagnostic information is  necessary to determine patient infection status.  Positive results do  not rule out bacterial infection or co-infection with other viruses. If result is PRESUMPTIVE POSTIVE SARS-CoV-2 nucleic acids MAY BE PRESENT.   A presumptive  positive result was obtained on the submitted specimen  and confirmed on repeat testing.  While 2019 novel coronavirus  (SARS-CoV-2) nucleic acids may be present in the submitted sample  additional confirmatory testing may be necessary for epidemiological  and / or clinical management purposes  to differentiate between  SARS-CoV-2 and other Sarbecovirus currently known to infect humans.  If clinically indicated additional testing with an alternate test  methodology [phone]) is advised. The SARS-CoV-2 RNA is generally  detectable in upper and lower respiratory sp ecimens during the acute  phase of infection. The expected result is Negative. Fact Sheet for Patients:  StrictlyIdeas.no Fact Sheet for Healthcare Providers: BankingDealers.co.za This test is not yet approved or cleared by the [place] FDA and has been authorized for detection and/or diagnosis of SARS-CoV-2 by FDA under an Emergency Use Authorization (EUA).  This EUA will remain in effect (meaning this test can be used) for the duration of the COVID-19 declaration under Section 564(b)(1) of the Act, 21 U.S.C. section 360bbb-3(b)(1), unless the authorization is terminated or revoked sooner. Performed at [facility], [place] [address] [place], [place] [address]      Time coordinating discharge: 45 minutes  SIGNED:   Tawni Millers, MD  [facility] 10/18/2018, 11:51 AM

## 2018-10-18 NOTE — Anesthesia Procedure Notes (Signed)
Procedure Name: MAC Date/Time: 10/18/2018 8:29 AM Performed by: Mariea Clonts, CRNA Pre-anesthesia Checklist: Patient identified, Emergency Drugs available, Suction available, Patient being monitored and Timeout performed Patient Re-evaluated:Patient Re-evaluated prior to induction Oxygen Delivery Method: Nasal cannula

## 2018-10-18 NOTE — Op Note (Signed)
[facility] Patient Name: Tonya [name] Procedure Date : 10/18/2018 MRN: [id number] Attending MD: Jackquline Denmark , MD Date of Birth: Jul 02, 1940 CSN: [id number] Age: 78 Admit Type: Inpatient Procedure:                Colonoscopy Indications:              Iron deficiency anemia secondary to chronic blood                            loss, Abnormal CT of the GI tract Providers:                Jackquline Denmark, MD, Glori Bickers, RN, William Dalton, Technician Referring MD:              Medicines:                Monitored Anesthesia Care Complications:            No immediate complications. Estimated Blood Loss:     Estimated blood loss: none. Procedure:                Pre-Anesthesia Assessment:                           - Prior to the procedure, a History and Physical                            was performed, and patient medications and                            allergies were reviewed. The patient's tolerance of                            previous anesthesia was also reviewed. The risks                            and benefits of the procedure and the sedation                            options and risks were discussed with the patient.                            All questions were answered, and informed consent                            was obtained. Prior Anticoagulants: The patient has                            taken no previous anticoagulant or antiplatelet                            agents. ASA Grade Assessment: II - A patient with  mild systemic disease. After reviewing the risks                            and benefits, the patient was deemed in                            satisfactory condition to undergo the procedure.                           After obtaining informed consent, the colonoscope                            was passed under direct vision. Throughout the                            procedure, the patient's  blood pressure, pulse, and                            oxygen saturations were monitored continuously. The                            PCF-H190DL ([id number]) Olympus pediatric colonoscope                            was introduced through the anus and advanced to the                            the ascending colon to examine a mass. This was the                            intended extent. The colonoscopy was performed                            without difficulty. The patient tolerated the                            procedure well. The quality of the bowel                            preparation was excellent. Scope In: 8:39:43 AM Scope Out: 9:00:37 AM Total Procedure Duration: 0 hours 20 minutes 54 seconds  Findings:      A fungating and ulcerated partially obstructing large mass was found in       the mid ascending colon. The mass was circumferential. No bleeding was       present but was very friable. It did not allow peds colonoscope       (diameter 11.2 mm) to be passed beyond. This was biopsied with a cold       forceps for histology. Area was tattooed with an injection of 4 mL of       Spot (carbon black).      A 8 mm polyp was found in the rectum. The polyp was sessile. The polyp       was removed with a cold snare. Resection and retrieval were complete.  Non-bleeding internal hemorrhoids were found during retroflexion. The       hemorrhoids were small. Impression:               - Malignant partially obstructing tumor in the mid                            ascending colon. Biopsied. Tattooed.                           - Rectal polyp status post polypectomy. Moderate Sedation:      none Recommendation:           - Return patient to hospital ward for ongoing care.                           - Resume previous diet.                           - Await pathology results.                           - Follow CEA level.                           - Suggest oncology consultation.                            - She is not clinically obstructed but is at a                            higher risk. Does not want to pursue surgery at                            this time. Understands that it will be palliative                            only. Procedure Code(s):        --- Professional ---                           [phone], 52, Colonoscopy, flexible; with removal of                            tumor(s), polyp(s), or other lesion(s) by snare                            technique                           45381, 52, Colonoscopy, flexible; with directed                            submucosal injection(s), any substance                           56387, 59,52, Colonoscopy, flexible; with biopsy,  single or multiple Diagnosis Code(s):        --- Professional ---                           C18.2, Malignant neoplasm of ascending colon                           K56.690, Other partial intestinal obstruction                           K62.1, Rectal polyp                           K64.8, Other hemorrhoids                           D50.0, Iron deficiency anemia secondary to blood                            loss (chronic)                           R93.3, Abnormal findings on diagnostic imaging of                            other parts of digestive tract CPT copyright 2019 [employer]. All rights reserved. The codes documented in this report are preliminary and upon coder review may  be revised to meet current compliance requirements. Jackquline Denmark, MD 10/18/2018 9:14:16 AM This report has been signed electronically. Number of Addenda: 0

## 2018-10-18 NOTE — TOC Transition Note (Signed)
Transition of Care [facility]) - CM/SW Discharge Note   Patient Details  Name: Tonya [name] MRN: [id number] Date of Birth: 01-18-1941  Transition of Care [facility]) CM/SW Contact:  Pollie Friar, RN Phone Number: 10/18/2018, 1:48 PM   Clinical Narrative:    Pt discharging home with [facility]. [facility] accepted referral 2 days ago. CM updated her on d/c today.  Pt has transportation home.    Final next level of care: Home w Home Health Services Barriers to Discharge: No Barriers Identified   Patient Goals and CMS Choice   CMS Medicare.gov Compare Post Acute Care list provided to:: Patient Represenative (must comment) Choice offered to / list presented to : Adult Children  Discharge Placement                       Discharge Plan and Services                          HH Arranged: PT, RN [facility] Agency: [place] Date [facility] Agency Contacted: 10/18/18 Time [place]: 1321 Representative spoke with at [place]: Malachy Mood notified of d/c  Social Determinants of Health ([place]) Interventions     Readmission Risk Interventions No flowsheet data found.

## 2018-10-18 NOTE — Transfer of Care (Signed)
Immediate Anesthesia Transfer of Care Note  Patient: Tonya [name]  Procedure(s) Performed: COLONOSCOPY WITH PROPOFOL (N/A ) SUBMUCOSAL TATTOO INJECTION BIOPSY POLYPECTOMY  Patient Location: Endoscopy Unit  Anesthesia Type:MAC  Level of Consciousness: awake, alert  and oriented  Airway & Oxygen Therapy: Patient Spontanous Breathing and Patient connected to nasal cannula oxygen  Post-op Assessment: Report given to RN, Post -op Vital signs reviewed and stable and Patient moving all extremities X 4  Post vital signs: Reviewed and stable  Last Vitals:  Vitals Value Taken Time  BP    Temp    Pulse    Resp    SpO2      Last Pain:  Vitals:   10/18/18 0740  TempSrc: Temporal  PainSc: 0-No pain         Complications: No apparent anesthesia complications

## 2018-10-18 NOTE — Interval H&P Note (Signed)
History and Physical Interval Note:  10/18/2018 8:20 AM  Tonya [name]  has presented today for surgery, with the diagnosis of IDA.  tumor in cecum/ascending colon.  The various methods of treatment have been discussed with the patient and family. After consideration of risks, benefits and other options for treatment, the patient has consented to  Procedure(s): COLONOSCOPY WITH PROPOFOL (N/A) as a surgical intervention.  The patient's history has been reviewed, patient examined, no change in status, stable for surgery.  I have reviewed the patient's chart and labs.  Questions were answered to the patient's satisfaction.     Jackquline Denmark

## 2018-10-18 NOTE — Progress Notes (Signed)
Pt picked up by endo; pt transported off unit to endoscopy. Reported off to oncoming RN. Delia Heady RN

## 2018-10-18 NOTE — Progress Notes (Signed)
Patient being discharged home with home health. Education and instructions provided to patient and family. IV removed. All belongings with patient. Education and instructions provided to patient.

## 2018-10-20 ENCOUNTER — Encounter (HOSPITAL_COMMUNITY): Payer: Self-pay | Admitting: Gastroenterology

## 2018-10-21 ENCOUNTER — Encounter: Payer: Self-pay | Admitting: Family Medicine

## 2018-10-21 ENCOUNTER — Telehealth: Payer: Self-pay

## 2018-10-21 DIAGNOSIS — R69 Illness, unspecified: Secondary | ICD-10-CM | POA: Diagnosis not present

## 2018-10-21 NOTE — Telephone Encounter (Signed)
Patient was discharged from the hospital Please call her and see how she is doing, reconcile her meds and set her up for follow up next week

## 2018-10-21 NOTE — Telephone Encounter (Signed)
Spoke with pt today about her recent hospital visit. She was admitted on 10/15/2018 and was discharged on 10/18/2018. She states she feels ok today but they did try to change her Januvia but she told them she did not want to change the medication so they told her to continue to take. She also states they recommended  she take an Iron tablet for her anemia.  She states she has made her follow-up appointments with Endo and Cardio. She states she will call back to schedule a hospital follow-up.

## 2018-10-22 ENCOUNTER — Encounter: Payer: Self-pay | Admitting: *Deleted

## 2018-10-22 ENCOUNTER — Telehealth: Payer: Self-pay | Admitting: Hematology & Oncology

## 2018-10-22 ENCOUNTER — Telehealth: Payer: Self-pay

## 2018-10-22 NOTE — Telephone Encounter (Signed)
Spoke with patient to confirm new patient appointment 7/10 at 3 pm

## 2018-10-22 NOTE — Progress Notes (Signed)
[facility] request sent on specimen [phone] per Dr Antonieta Pert request

## 2018-10-22 NOTE — Telephone Encounter (Signed)
lmom for patient to return call to office to confirm new patient appt 7/10 at 3 pm per 6/29 sch msg

## 2018-10-22 NOTE — Anesthesia Postprocedure Evaluation (Signed)
Anesthesia Post Note  Patient: Tonya [name]  Procedure(s) Performed: COLONOSCOPY WITH PROPOFOL (N/A ) SUBMUCOSAL TATTOO INJECTION BIOPSY POLYPECTOMY     Patient location during evaluation: Endoscopy Anesthesia Type: MAC Level of consciousness: awake and alert Pain management: pain level controlled Vital Signs Assessment: post-procedure vital signs reviewed and stable Respiratory status: spontaneous breathing, nonlabored ventilation, respiratory function stable and patient connected to nasal cannula oxygen Cardiovascular status: stable and blood pressure returned to baseline Postop Assessment: no apparent nausea or vomiting Anesthetic complications: no    Last Vitals:  Vitals:   10/18/18 0920 10/18/18 1118  BP: 126/63 (!) 135/59  Pulse: 60 100  Resp: 19 17  Temp:  36.8 C  SpO2: 99% 100%    Last Pain:  Vitals:   10/18/18 1118  TempSrc: Axillary  PainSc:                  [name]

## 2018-10-22 NOTE — Anesthesia Preprocedure Evaluation (Addendum)
Anesthesia Evaluation  Patient identified by MRN, date of birth, ID band Patient awake    Reviewed: Allergy & Precautions, NPO status , Patient's Chart, lab work & pertinent test results  Airway Mallampati: II       Dental   Pulmonary former smoker,    breath sounds clear to auscultation       Cardiovascular hypertension,  Rhythm:Regular Rate:Normal     Neuro/Psych    GI/Hepatic   Endo/Other  diabetes  Renal/GU      Musculoskeletal   Abdominal   Peds  Hematology   Anesthesia Other Findings   Reproductive/Obstetrics                            Anesthesia Physical Anesthesia Plan  ASA: III  Anesthesia Plan: MAC   Post-op Pain Management:    Induction: Intravenous  PONV Risk Score and Plan: Propofol infusion and Ondansetron  Airway Management Planned: Natural Airway and Nasal Cannula  Additional Equipment:   Intra-op Plan:   Post-operative Plan:   Informed Consent: I have reviewed the patients History and Physical, chart, labs and discussed the procedure including the risks, benefits and alternatives for the proposed anesthesia with the patient or authorized representative who has indicated his/her understanding and acceptance.       Plan Discussed with: CRNA and Anesthesiologist  Anesthesia Plan Comments:         Anesthesia Quick Evaluation

## 2018-10-22 NOTE — Telephone Encounter (Signed)
Noted  

## 2018-10-22 NOTE — Telephone Encounter (Signed)
-----   Message from Jackquline Denmark, MD sent at 10/21/2018 10:25 PM EDT ----- Pt's daughter is aware. Nl CEA CT - does show liver mets. Should have appt with Dr Marin Olp  (He also took care of pt's husband. Family was very appreciative of Him). I did tell the family that Dr Marin Olp is a nice guy but routes for wrong soccer/football team !!!!!

## 2018-10-24 ENCOUNTER — Telehealth: Payer: Self-pay | Admitting: Gastroenterology

## 2018-10-24 NOTE — Telephone Encounter (Signed)
Discussed in detail with the patient's daughter over the phone. All the questions were answered  RG

## 2018-10-24 NOTE — Telephone Encounter (Signed)
Pt's daughter called to inform that she is really appreciative of Dr. Steve Rattler bedside manners and said that "he came from [place]."    She stated that she was expecting a follow up call from her mother's procedure.

## 2018-10-24 NOTE — Telephone Encounter (Signed)
Patient daughter is returning your call. CB # [phone]

## 2018-10-24 NOTE — Telephone Encounter (Signed)
Dr Lyndel Safe the pt's daughter [name] is calling stating she has not received a call from our office.  I see in the note on the pathology result from 6/26 that you documented speaking with the daughter.  [name] says she has not spoken to you or anyone from our office an would like a call today to discuss.

## 2018-10-30 DIAGNOSIS — C189 Malignant neoplasm of colon, unspecified: Secondary | ICD-10-CM | POA: Diagnosis not present

## 2018-10-31 ENCOUNTER — Encounter (HOSPITAL_COMMUNITY): Payer: Self-pay | Admitting: Hematology & Oncology

## 2018-10-31 ENCOUNTER — Ambulatory Visit (INDEPENDENT_AMBULATORY_CARE_PROVIDER_SITE_OTHER): Payer: Medicare HMO | Admitting: Cardiology

## 2018-10-31 ENCOUNTER — Other Ambulatory Visit: Payer: Self-pay

## 2018-10-31 ENCOUNTER — Encounter: Payer: Self-pay | Admitting: Cardiology

## 2018-10-31 VITALS — BP 133/88 | HR 56 | Ht 71.0 in | Wt 169.0 lb

## 2018-10-31 DIAGNOSIS — R0609 Other forms of dyspnea: Secondary | ICD-10-CM | POA: Diagnosis not present

## 2018-10-31 DIAGNOSIS — I10 Essential (primary) hypertension: Secondary | ICD-10-CM | POA: Diagnosis not present

## 2018-10-31 DIAGNOSIS — I493 Ventricular premature depolarization: Secondary | ICD-10-CM

## 2018-10-31 DIAGNOSIS — I472 Ventricular tachycardia: Secondary | ICD-10-CM

## 2018-10-31 DIAGNOSIS — I4729 Other ventricular tachycardia: Secondary | ICD-10-CM

## 2018-10-31 NOTE — Progress Notes (Signed)
Virtual Visit via Telephone Note: Patient unable to use video assisted device.  This visit type was conducted due to national recommendations for restrictions regarding the COVID-19 Pandemic (e.g. social distancing).  This format is felt to be most appropriate for this patient at this time.  All issues noted in this document were discussed and addressed.  No physical exam was performed.  The patient has consented to conduct a Telehealth visit and understands insurance will be billed.   I connected with@, on 10/31/18 at  by TELEPHONE and verified that I am speaking with the correct person using two identifiers.   I discussed the limitations of evaluation and management by telemedicine and the availability of in person appointments. The patient expressed understanding and agreed to proceed.   I have discussed with patient regarding the safety during COVID Pandemic and steps and precautions to be taken including social distancing, frequent hand wash and use of detergent soap, gels with the patient. I asked the patient to avoid touching mouth, nose, eyes, ears with the hands. I encouraged regular walking around the neighborhood and exercise and regular diet, as long as social distancing can be maintained.  Primary Physician/Referring:  Forrest Moron, MD  Patient ID: Tonya [name], female    DOB: 04-08-1941, 78 y.o.   MRN: [id number]  No chief complaint on file.   HPI: Tonya [name]  is a 78 y.o. female  with Hypertension, hyperlipidemia, uncontrolled diabetes mellitus, history of stroke involving the right pons in 2001 with moderate diffuse atherosclerotic changes in the intracerebral vessels when she presented with left-sided weakness and slurred speech, lumbosacral spondylosis with resolution of back pain with steroid injection in the past, nonsmoker referred to me for evaluation of frequent episodes of dizziness and near syncope that started several months ago, dyspnea on exertion and abnormal  EKG with frequent PVCs.    She was readmitted to the hospital on 10/18/2018 with severe anemia with a hemoglobin of 6.6.  She underwent GI evaluation and found to have mid descending colon cancer complicated by symptomatic anemia, liver metastasis. She did not want to have surgery.   Due to dizziness and NSVT noted on the event monitor and also worsening dyspnea she had undergone nuclear stress test and now presents for a virtual visit  No associated chest pain.  No PND or orthopnea. Her daughter is present. She also c/o fatigue.  Scheduled to see Dr. Marin Olp tomorrow.  Past Medical History:  Diagnosis Date  . Cerebrovascular accident ([place])   . Chronic edema    ? venous insufficiency  . COPD (chronic obstructive pulmonary disease) ([place])    never had a problem breathing  . Diabetes mellitus without complication ([place])   . Enlarged heart    per pt  . Gallstones   . GERD (gastroesophageal reflux disease)   . Hyperlipidemia   . Hypertension     Past Surgical History:  Procedure Laterality Date  . ABDOMINAL HYSTERECTOMY    . BIOPSY  10/18/2018   Procedure: BIOPSY;  Surgeon: Jackquline Denmark, MD;  Location: [facility] ENDOSCOPY;  Service: Endoscopy;;  . CHOLECYSTECTOMY    . COLONOSCOPY WITH PROPOFOL N/A 10/18/2018   Procedure: COLONOSCOPY WITH PROPOFOL;  Surgeon: Jackquline Denmark, MD;  Location: [facility] ENDOSCOPY;  Service: Endoscopy;  Laterality: N/A;  . POLYPECTOMY  10/18/2018   Procedure: POLYPECTOMY;  Surgeon: Jackquline Denmark, MD;  Location[facility] ENDOSCOPY;  Service: Endoscopy;;  . SUBMUCOSAL TATTOO INJECTION  10/18/2018   Procedure: SUBMUCOSAL TATTOO INJECTION;  Surgeon: Lyndel Safe,  Peyton Bottoms, MD;  Location: [facility];  Service: Endoscopy;;    Social History   Socioeconomic History  . Marital status: Widowed    Spouse name: Not on file  . Number of children: 5  . Years of education: Not on file  . Highest education level: Not on file  Occupational History  . Not on file  Social Needs  . Financial  resource strain: Not on file  . Food insecurity    Worry: Not on file    Inability: Not on file  . Transportation needs    Medical: Not on file    Non-medical: Not on file  Tobacco Use  . Smoking status: Former Smoker    Packs/day: 0.25    Years: 1.00    Pack years: 0.25    Quit date: 07/23/1968    Years since quitting: 50.3  . Smokeless tobacco: Never Used  Substance and Sexual Activity  . Alcohol use: No    Alcohol/week: 0.0 standard drinks  . Drug use: No  . Sexual activity: Not Currently  Lifestyle  . Physical activity    Days per week: Not on file    Minutes per session: Not on file  . Stress: Not on file  Relationships  . Social Herbalist on phone: Not on file    Gets together: Not on file    Attends religious service: Not on file    Active member of club or organization: Not on file    Attends meetings of clubs or organizations: Not on file    Relationship status: Not on file  . Intimate partner violence    Fear of current or ex partner: Not on file    Emotionally abused: Not on file    Physically abused: Not on file    Forced sexual activity: Not on file  Other Topics Concern  . Not on file  Social History Narrative  . Not on file    Review of Systems  Constitution: Negative for chills, decreased appetite, malaise/fatigue and weight gain.  Cardiovascular: Positive for dyspnea on exertion and leg swelling (chronic and mild). Negative for syncope.  Endocrine: Negative for cold intolerance.  Hematologic/Lymphatic: Does not bruise/bleed easily.  Musculoskeletal: Positive for muscle weakness (left arm and left leg since stroke). Negative for falls and joint swelling.  Gastrointestinal: Negative for abdominal pain, anorexia and change in bowel habit.  Neurological: Positive for focal weakness (left arm and leg weakness), light-headedness and loss of balance. Negative for headaches and seizures.  Psychiatric/Behavioral: Negative for depression and  substance abuse.  All other systems reviewed and are negative.     Objective  Blood pressure 133/88, pulse (!) 56, height '5\' 11"'  (1.803 m), weight 169 lb (76.7 kg). Body mass index is 23.57 kg/m.  Physical exam not performed or limited due to virtual visit.  Please see exam details from prior visit is as below.  Physical Exam  Constitutional: She appears well-nourished. No distress.  Moderately built  HENT:  Head: Atraumatic.  Eyes: Conjunctivae are normal.  Neck: Neck supple. No JVD present. No thyromegaly present.  Cardiovascular: Normal rate, regular rhythm, normal heart sounds and intact distal pulses.  Occasional extrasystoles are present. Exam reveals no gallop.  No murmur heard. Pulmonary/Chest: Effort normal and breath sounds normal.  Abdominal: Soft. Bowel sounds are normal.  Musculoskeletal: Normal range of motion.        General: Edema (1-2 plus ankle pitting edema) present.  Neurological: She is alert.  Grossly  left arm weakness and mild contracture noted, left lower extremity weakness present.  Skin: Skin is warm and dry.  Psychiatric: She has a normal mood and affect.   Radiology: No results found.  Laboratory examination:    CMP Latest Ref Rng & Units 10/17/2018 10/16/2018 10/15/2018  Glucose 70 - 99 mg/dL 187(H) 172(H) 168(H)  BUN 8 - 23 mg/dL '11 10 15  ' Creatinine 0.44 - 1.00 mg/dL 0.62 0.67 0.75  Sodium 135 - 145 mmol/L 139 139 140  Potassium 3.5 - 5.1 mmol/L 3.5 3.6 3.9  Chloride 98 - 111 mmol/L 108 107 107  CO2 22 - 32 mmol/L '23 25 25  ' Calcium 8.9 - 10.3 mg/dL 8.7(L) 8.8(L) 9.1  Total Protein 6.5 - 8.1 g/dL 5.8(L) - 6.2(L)  Total Bilirubin 0.3 - 1.2 mg/dL 1.0 - 0.5  Alkaline Phos 38 - 126 U/L 74 - 82  AST 15 - 41 U/L 17 - 19  ALT 0 - 44 U/L 12 - 13   CBC Latest Ref Rng & Units 10/18/2018 10/17/2018 10/16/2018  WBC 4.0 - 10.5 K/uL 8.7 7.4 7.1  Hemoglobin 12.0 - 15.0 g/dL 10.3(L) 8.8(L) 8.8(L)  Hematocrit 36.0 - 46.0 % 35.8(L) 30.1(L) 29.6(L)  Platelets  150 - 400 K/uL 313 275 273   Lipid Panel     Component Value Date/Time   CHOL 137 03/30/2018 1201   TRIG 80 03/30/2018 1201   HDL 49 03/30/2018 1201   CHOLHDL 2.8 03/30/2018 1201   CHOLHDL 3 07/17/2017 1227   VLDL 21.6 07/17/2017 1227   LDLCALC 72 03/30/2018 1201   HEMOGLOBIN A1C Lab Results  Component Value Date   HGBA1C 7.2 (A) 08/20/2018   TSH Recent Labs    03/30/18 1201  TSH 2.420    PRN Meds:. There are no discontinued medications. Current Meds  Medication Sig  . amLODipine (NORVASC) 5 MG tablet   . aspirin EC 81 MG tablet Take 81 mg by mouth daily.  Marland Kitchen atorvastatin (LIPITOR) 10 MG tablet TAKE 1 TABLET(10 MG) BY MOUTH DAILY (Patient taking differently: Take 10 mg by mouth daily. )  . Blood Glucose Monitoring Suppl (ONETOUCH VERIO) w/Device KIT Prick finger 2 times a day to test blood glucose level  . Blood Pressure Monitoring (SPHYGMOMANOMETER) MISC 1 Units by Does not apply route every morning.  . hydrochlorothiazide (HYDRODIURIL) 25 MG tablet Take 25 mg by mouth daily.  . Lancets (ONETOUCH ULTRASOFT) lancets Ultra soft smooth 33 gauge. Prick finger 2 times a day to test blood glucose level (Patient taking differently: 1 each by Other route 2 (two) times a day. One Touch Verio smooth 33 gauge)  . metoprolol tartrate (LOPRESSOR) 25 MG tablet Take 0.5 tablets (12.5 mg total) by mouth 2 (two) times daily.  Glory Rosebush VERIO test strip 1 each by Other route 3 (three) times daily. USE TO TEST BLOOD SUGAR UP TO QID  . sitaGLIPtin (JANUVIA) 50 MG tablet Take 1 tablet (50 mg total) by mouth daily. (Patient taking differently: Take 25 mg by mouth 2 (two) times a day. )    Cardiac Studies:  Holter Monitor for 24 hours  10/09/2016:  Normal sinus rhythm, sinus bradycardia and sinus tachycardia with heart rate ranging from 45-121bpm with average heart rate 69bpm.  Frequent PVCs, bigeminal PVCs, salvos and nonsustained ventricular tachycardia up to 5 beats in a ros  PVC load  was 20%.  Occasional PACs and nonsustained atrial tachycardia up to 7 beats in a row.  Event monitor 30 days 07/30/2018: Predominant  rhythm is normal sinus rhythm.  There were 3 runs of NSVT, maximum 13 beats, minimum 3 beats, asymptomatic.  Frequent PVCs, ventricular ectopic burden 5%.  There are occasional PACs.  Echocardiogram 08/28/2018: Left ventricle cavity is normal in size. Moderate concentric hypertrophy of the left ventricle. Normal global wall motion. Doppler evidence of grade II (pseudonormal) diastolic dysfunction, elevated LAP. Calculated EF 53%. Mild (Grade I) mitral regurgitation. Mild tricuspid regurgitation. Estimated pulmonary artery systolic pressure 26 mmHg. Mild pulmonic regurgitation. Compared to 10/09/16, previously mild LVH.   Lexiscan Myoview stress test 10/08/2018: Lexiscan stress test was performed. Stress EKG is non-diagnostic, as this is pharmacological stress test. Myocardial pefusion imaging is normal. LVEF 65%. Low risk study.  No significant change from  10/09/2016.  Assessment   1. Frequent PVCs   2. NSVT (nonsustained ventricular tachycardia) ([place])   3. Dyspnea on exertion   4. Essential hypertension    EKG 05/31/2018: Sinus rhythm with first-degree AV block at rate of 56 bpm, left axis deviation, left anterior fascicular block.  Cannot exclude inferior infarct old, anteroseptal infarct old.  No evidence of ischemia.  EKG 09/19/2016: Normal sinus rhythm at the rate of 85 bpm, left axis deviation, poor R-wave progression, frequent PVCs in the form of ventricular bigeminy, ventricular couplets and triplets.  Recommendations:   I have reviewed the results of the stress test and echocardiogram with the patient.  She has low risk stress test.  So reviewed her hospital records, she has a new diagnosis of colonic cancer.  She had refused surgery, advised her that from the cardiac standpoint she would be low risk for proceeding with surgery.  She appears  motivated now and is willing to consider surgery.  Dyspnea has remained stable, probably related to deconditioning and also underlying anemia.  Blood pressure is now stable and well-controlled on the present medications.  With regard to dizziness, suspect it may have been related to underlying colonic cancer as there was no orthostasis, she has not had any significant arrhythmias except for one episode of nonsustained VT on event monitor without any heart block.  Due to her age AV nodal disease is still a possibility but does not appear to be high on the list.  Hence advised continued observation for now and work-up for colonic cancer  will be most appropriate.  Otherwise from cardiac standpoint she remained stable, I will see her back in a year.  Adrian Prows, MD, [facility] 10/31/2018, 9:54 AM [facility] [place] Pager: [phone] Office: [phone] If no answer Cell [phone]

## 2018-11-01 ENCOUNTER — Inpatient Hospital Stay: Payer: Medicare HMO

## 2018-11-01 ENCOUNTER — Other Ambulatory Visit: Payer: Self-pay

## 2018-11-01 ENCOUNTER — Inpatient Hospital Stay: Payer: Medicare HMO | Attending: Hematology & Oncology | Admitting: Hematology & Oncology

## 2018-11-01 ENCOUNTER — Encounter: Payer: Self-pay | Admitting: Hematology & Oncology

## 2018-11-01 VITALS — BP 132/85 | HR 88 | Temp 97.8°F | Resp 19 | Wt 166.0 lb

## 2018-11-01 DIAGNOSIS — Z5111 Encounter for antineoplastic chemotherapy: Secondary | ICD-10-CM | POA: Diagnosis present

## 2018-11-01 DIAGNOSIS — E785 Hyperlipidemia, unspecified: Secondary | ICD-10-CM | POA: Diagnosis not present

## 2018-11-01 DIAGNOSIS — C787 Secondary malignant neoplasm of liver and intrahepatic bile duct: Secondary | ICD-10-CM | POA: Insufficient documentation

## 2018-11-01 DIAGNOSIS — C182 Malignant neoplasm of ascending colon: Secondary | ICD-10-CM | POA: Insufficient documentation

## 2018-11-01 DIAGNOSIS — Z8673 Personal history of transient ischemic attack (TIA), and cerebral infarction without residual deficits: Secondary | ICD-10-CM | POA: Diagnosis not present

## 2018-11-01 DIAGNOSIS — Z7982 Long term (current) use of aspirin: Secondary | ICD-10-CM | POA: Diagnosis not present

## 2018-11-01 DIAGNOSIS — E119 Type 2 diabetes mellitus without complications: Secondary | ICD-10-CM | POA: Insufficient documentation

## 2018-11-01 DIAGNOSIS — J449 Chronic obstructive pulmonary disease, unspecified: Secondary | ICD-10-CM | POA: Diagnosis not present

## 2018-11-01 DIAGNOSIS — D649 Anemia, unspecified: Secondary | ICD-10-CM | POA: Diagnosis not present

## 2018-11-01 DIAGNOSIS — I10 Essential (primary) hypertension: Secondary | ICD-10-CM | POA: Diagnosis not present

## 2018-11-01 DIAGNOSIS — Z7189 Other specified counseling: Secondary | ICD-10-CM

## 2018-11-01 DIAGNOSIS — Z87891 Personal history of nicotine dependence: Secondary | ICD-10-CM

## 2018-11-01 DIAGNOSIS — Z79899 Other long term (current) drug therapy: Secondary | ICD-10-CM | POA: Diagnosis not present

## 2018-11-01 DIAGNOSIS — Z7984 Long term (current) use of oral hypoglycemic drugs: Secondary | ICD-10-CM | POA: Insufficient documentation

## 2018-11-01 DIAGNOSIS — C189 Malignant neoplasm of colon, unspecified: Secondary | ICD-10-CM

## 2018-11-01 HISTORY — DX: Other specified counseling: Z71.89

## 2018-11-01 LAB — CBC WITH DIFFERENTIAL (CANCER CENTER ONLY)
Abs Immature Granulocytes: 0.02 10*3/uL (ref 0.00–0.07)
Basophils Absolute: 0 10*3/uL (ref 0.0–0.1)
Basophils Relative: 0 %
Eosinophils Absolute: 0.2 10*3/uL (ref 0.0–0.5)
Eosinophils Relative: 2 %
HCT: 33.5 % — ABNORMAL LOW (ref 36.0–46.0)
Hemoglobin: 9.5 g/dL — ABNORMAL LOW (ref 12.0–15.0)
Immature Granulocytes: 0 %
Lymphocytes Relative: 22 %
Lymphs Abs: 2 10*3/uL (ref 0.7–4.0)
MCH: 19.8 pg — ABNORMAL LOW (ref 26.0–34.0)
MCHC: 28.4 g/dL — ABNORMAL LOW (ref 30.0–36.0)
MCV: 69.8 fL — ABNORMAL LOW (ref 80.0–100.0)
Monocytes Absolute: 0.7 10*3/uL (ref 0.1–1.0)
Monocytes Relative: 8 %
Neutro Abs: 6 10*3/uL (ref 1.7–7.7)
Neutrophils Relative %: 68 %
Platelet Count: 338 10*3/uL (ref 150–400)
RBC: 4.8 MIL/uL (ref 3.87–5.11)
RDW: 26.7 % — ABNORMAL HIGH (ref 11.5–15.5)
WBC Count: 9 10*3/uL (ref 4.0–10.5)
nRBC: 0 % (ref 0.0–0.2)

## 2018-11-01 LAB — CMP (CANCER CENTER ONLY)
ALT: 13 U/L (ref 0–44)
AST: 19 U/L (ref 15–41)
Albumin: 3.7 g/dL (ref 3.5–5.0)
Alkaline Phosphatase: 99 U/L (ref 38–126)
Anion gap: 10 (ref 5–15)
BUN: 16 mg/dL (ref 8–23)
CO2: 26 mmol/L (ref 22–32)
Calcium: 8.9 mg/dL (ref 8.9–10.3)
Chloride: 105 mmol/L (ref 98–111)
Creatinine: 0.63 mg/dL (ref 0.44–1.00)
GFR, Est AFR Am: >60 mL/min (ref >60)
GFR, Estimated: >60 mL/min (ref >60)
Glucose, Bld: 206 mg/dL — ABNORMAL HIGH (ref 70–99)
Potassium: 3.8 mmol/L (ref 3.5–5.1)
Sodium: 141 mmol/L (ref 135–145)
Total Bilirubin: 0.5 mg/dL (ref 0.3–1.2)
Total Protein: 6.4 g/dL — ABNORMAL LOW (ref 6.5–8.1)

## 2018-11-01 NOTE — Progress Notes (Signed)
START ON PATHWAY REGIMEN - Colorectal     A cycle is every 14 days:     Oxaliplatin      Leucovorin      Fluorouracil      Fluorouracil   **Always confirm dose/schedule in your pharmacy ordering system**  Patient Characteristics: Distant Metastases, Nonsurgical Candidate, KRAS/NRAS Mutation Positive/Unknown (BRAF V600 Wild-Type/Unknown), Standard Cytotoxic Therapy, First Line Standard Cytotoxic Therapy, Bevacizumab Ineligible, PS = 0,1 Tumor Location: Colon Therapeutic Status: Distant Metastases Microsatellite/Mismatch Repair Status: MSS/pMMR BRAF Mutation Status: Wild-Type (no mutation) KRAS/NRAS Mutation Status: Mutation Positive Standard Cytotoxic Line of Therapy: First Line Standard Cytotoxic Therapy ECOG Performance Status: 1 Bevacizumab Eligibility: Ineligible Intent of Therapy: Curative Intent, Discussed with Patient

## 2018-11-01 NOTE — Progress Notes (Signed)
Referral MD  Reason for Referral: Stage IV --  Oligmetastatic -- colon cancer of the ascending colon -- solitary liver met -- NRAS (+)  Chief Complaint  Patient presents with  . Follow-up  : I just feeling weak.  HPI: Tonya [name] is a very nice 78 year old African-American woman.  She actually is the wife of my former patient.  His name was also Tonya [name].  I am pretty sure he had colon cancer.  He passed away in 07-03-2006.  She had a stroke in 1999/07/03.  She still has some weakness over on her right side.  She has had been doing pretty well.  He has been pretty functional.  She has a very good family support.  She is lost some weight.  She probably lost about 20 pounds over the past few months.  She then began to feel weaker.  She has little energy.  She did not notice any change in bowel or bladder habits.  She was eating okay.  She had no nausea or vomiting.  She had no obvious bleeding.  She ultimately went to the emergency room and late June.  She was found to be quite anemic.  Her hemoglobin was 6.6.  MCV was 65.  She underwent a CT scan of the abdomen and pelvis.  This showed that she had a large circumferential mass in the proximal cecum.  She has some enlarged lymph nodes.  She had a mass in the right lobe of the liver measuring 5.9 x 5 cm.  Couple days later she had a CT of the chest.  This was negative for any obvious metastatic disease.  She then underwent a colonoscopy.  This was done on 10/18/2018.  A mass was noted in the ascending colon.  This was biopsied.  The pathology report ([id number]) showed an adenocarcinoma.  The tumor was sent for MSI/MMR analysis.  She had normal MS I and was proficient in MMR.  We sent off molecular studies.  The tumor is positive for NRAS mutation.  It is negative for HER-2.  Had a low level of PD-L1.  It was negative for BRAF.  Because we had to take care of her husband, she wanted to come see [place].  As such, she was, referred to the [facility].  Overall, I would say her performance status is ECOG 1.     Past Medical History:  Diagnosis Date  . Cerebrovascular accident ([place])   . Chronic edema    ? venous insufficiency  . COPD (chronic obstructive pulmonary disease) ([place])    never had a problem breathing  . Diabetes mellitus without complication ([place])   . Enlarged heart    per pt  . Gallstones   . GERD (gastroesophageal reflux disease)   . Hyperlipidemia   . Hypertension   :  Past Surgical History:  Procedure Laterality Date  . ABDOMINAL HYSTERECTOMY    . BIOPSY  10/18/2018   Procedure: BIOPSY;  Surgeon: Jackquline Denmark, MD;  Location: [facility] ENDOSCOPY;  Service: Endoscopy;;  . CHOLECYSTECTOMY    . COLONOSCOPY WITH PROPOFOL N/A 10/18/2018   Procedure: COLONOSCOPY WITH PROPOFOL;  Surgeon: Jackquline Denmark, MD;  Location: [facility] ENDOSCOPY;  Service: Endoscopy;  Laterality: N/A;  . POLYPECTOMY  10/18/2018   Procedure: POLYPECTOMY;  Surgeon: Jackquline Denmark, MD;  Location: [facility] ENDOSCOPY;  Service: Endoscopy;;  . SUBMUCOSAL TATTOO INJECTION  10/18/2018   Procedure: SUBMUCOSAL TATTOO INJECTION;  Surgeon: Jackquline Denmark, MD;  Location: [facility] ENDOSCOPY;  Service: Endoscopy;;  :  Current Outpatient Medications:  .  amLODipine (NORVASC) 10 MG tablet, Take 1 tablet (10 mg total) by mouth daily. (Patient not taking: Reported on 10/31/2018), Disp: 30 tablet, Rfl: 0 .  amLODipine (NORVASC) 5 MG tablet, , Disp: , Rfl:  .  aspirin EC 81 MG tablet, Take 81 mg by mouth daily., Disp: , Rfl:  .  atorvastatin (LIPITOR) 10 MG tablet, TAKE 1 TABLET(10 MG) BY MOUTH DAILY (Patient taking differently: Take 10 mg by mouth daily. ), Disp: 90 tablet, Rfl: 1 .  Blood Glucose Monitoring Suppl (ONETOUCH VERIO) w/Device KIT, Prick finger 2 times a day to test blood glucose level, Disp: 1 kit, Rfl: 0 .  Blood Pressure Monitoring (SPHYGMOMANOMETER) MISC, 1 Units by Does not apply route every morning., Disp: 1 each, Rfl: 0 .  Cholecalciferol 2000 units TABS,  Take 1 tablet (2,000 Units total) by mouth daily. (Patient not taking: Reported on 10/31/2018), Disp: 90 tablet, Rfl: 1 .  hydrochlorothiazide (HYDRODIURIL) 25 MG tablet, Take 25 mg by mouth daily., Disp: , Rfl:  .  Lancets (ONETOUCH ULTRASOFT) lancets, Ultra soft smooth 33 gauge. Prick finger 2 times a day to test blood glucose level (Patient taking differently: 1 each by Other route 2 (two) times a day. One Touch Verio smooth 33 gauge), Disp: 100 each, Rfl: 12 .  metoprolol tartrate (LOPRESSOR) 25 MG tablet, Take 0.5 tablets (12.5 mg total) by mouth 2 (two) times daily., Disp: 60 tablet, Rfl: 0 .  ONETOUCH VERIO test strip, 1 each by Other route 3 (three) times daily. USE TO TEST BLOOD SUGAR UP TO QID, Disp: 100 each, Rfl: 0 .  sitaGLIPtin (JANUVIA) 50 MG tablet, Take 1 tablet (50 mg total) by mouth daily. (Patient taking differently: Take 25 mg by mouth 2 (two) times a day. ), Disp: 90 tablet, Rfl: 3:  :  Allergies  Allergen Reactions  . Iohexol Hives, Shortness Of Breath and Other (See Comments)    Patient developed hives and fullness in throat post injection of 125cc's Omni 300, Onset Date: 11/15/2006   . Hydrocodone Swelling and Other (See Comments)    "I started swelling, became red, and passed out"  . Ibuprofen Nausea Only and Other (See Comments)    "It made my stomach hurt"  . Naproxen Other (See Comments)    "It made me feel out of my head"  . Propoxyphene N-Acetaminophen Other (See Comments)    "I couldn't find the door to make my way out of the room- I was in misery"  :  Family History  Problem Relation Age of Onset  . Cirrhosis Mother   . Heart disease Father   . Cancer Other   . Hypertension Other   . Diabetes Other   :  Social History   Socioeconomic History  . Marital status: Widowed    Spouse name: Not on file  . Number of children: 5  . Years of education: Not on file  . Highest education level: Not on file  Occupational History  . Not on file  Social Needs   . Financial resource strain: Not on file  . Food insecurity    Worry: Not on file    Inability: Not on file  . Transportation needs    Medical: Not on file    Non-medical: Not on file  Tobacco Use  . Smoking status: Former Smoker    Packs/day: 0.25    Years: 1.00    Pack years: 0.25    Quit date: 07/23/1968  Years since quitting: 50.3  . Smokeless tobacco: Never Used  Substance and Sexual Activity  . Alcohol use: No    Alcohol/week: 0.0 standard drinks  . Drug use: No  . Sexual activity: Not Currently  Lifestyle  . Physical activity    Days per week: Not on file    Minutes per session: Not on file  . Stress: Not on file  Relationships  . Social Herbalist on phone: Not on file    Gets together: Not on file    Attends religious service: Not on file    Active member of club or organization: Not on file    Attends meetings of clubs or organizations: Not on file    Relationship status: Not on file  . Intimate partner violence    Fear of current or ex partner: Not on file    Emotionally abused: Not on file    Physically abused: Not on file    Forced sexual activity: Not on file  Other Topics Concern  . Not on file  Social History Narrative  . Not on file  :  Review of Systems  Constitutional: Positive for weight loss.  HENT: Negative.   Eyes: Negative.   Respiratory: Negative.   Cardiovascular: Negative.   Gastrointestinal: Positive for blood in stool.  Genitourinary: Negative.   Musculoskeletal: Negative.   Skin: Negative.   Neurological: Positive for focal weakness.  Endo/Heme/Allergies: Negative.   Psychiatric/Behavioral: Negative.      Exam: This is a well-developed well-nourished African-American female in no obvious distress.  She has some weakness that is chronic on her left side.  Vital signs are temperature 97.8.  Pulse 88.  Blood pressure 132/85.  Weight is 166 pounds.  Head exam shows no ocular or oral lesions.  She has no scleral icterus.   She has no adenopathy on the neck.  Lungs are clear bilaterally.  Cardiac exam shows an irregular rate and irregular rhythm with a normal S1 and S2.  This is consistent with atrial fibrillation.  Abdomen is soft.  She has good bowel sounds.  There is no fluid wave.  There is no palpable liver or spleen tip.  Back exam shows no tenderness over the spine, ribs or hips.  Extremities shows no clubbing, cyanosis or edema.  She has a weakness on her left side.  She has a stimulator on her left leg.  Neurological exam shows a chronic weakness on her left side.  Skin exam shows no rashes, ecchymoses or petechia.  '@IPVITALS'$ @   Recent Labs    11/01/18 1434  WBC 9.0  HGB 9.5*  HCT 33.5*  PLT 338   Recent Labs    11/01/18 1434  NA 141  K 3.8  CL 105  CO2 26  GLUCOSE 206*  BUN 16  CREATININE 0.63  CALCIUM 8.9    Blood smear review: None  Pathology: See above    Assessment and Plan: Tonya [name] is a very charming 78 year old African-American woman.  She has stage IV colon cancer.  This originated in her ascending colon.  She has a solitary liver met from what we can tell on her scans.  Of note, she had a normal CEA level of 4.3.  I just tells me that she is a great surgical candidate to have these tumors resected right now.  I think if anything, there was surgery that she could probably have would be to resect out the primary colon cancer.  I would  like to try her on "neoadjuvant" chemotherapy.  Maybe we can get a good response to chemotherapy and then what is in the liver can be treated with local therapy, possibly RFA or maybe even cryotherapy.  I think that she would be a candidate for systemic chemotherapy with FOLFOX.  I would not add Avastin given her history of CVA.  She will need to have a Port-A-Cath placed.  I spent about an hour and half with she and her daughter.  Everything was recorded so she could take all the information that we talked about home to her family.  I gave  her information sheets about the chemotherapy.  Again, I think she could tolerate this.  We will try to get started on July 20.  I would give her 4 cycles of treatment and then I would do scans to see how things look.  Again if we see a good response, then we might consider local therapy for the liver and possibly laparoscopic surgery for the colon cancer.  Again, I realize her age and the fact that she has had the CVA as complicating factors.  I answered all their questions.  We had a very good prayer session.  I gave her a prayer blanket.  She was grateful for this.  She has a very strong faith.  He was fun talking about her husband.  I certainly do remember him because he had really good hair.  I will plan to see her back when she starts her chemotherapy with her second cycle in August.

## 2018-11-04 ENCOUNTER — Telehealth: Payer: Self-pay | Admitting: *Deleted

## 2018-11-04 LAB — IRON AND TIBC
Iron: 41 ug/dL (ref 41–142)
Saturation Ratios: 11 % — ABNORMAL LOW (ref 21–57)
TIBC: 356 ug/dL (ref 236–444)
UIBC: 316 ug/dL (ref 120–384)

## 2018-11-04 LAB — FERRITIN: Ferritin: 8 ng/mL — ABNORMAL LOW (ref 11–307)

## 2018-11-04 LAB — CEA (IN HOUSE-CHCC): CEA (CHCC-In House): 3.89 ng/mL (ref 0.00–5.00)

## 2018-11-04 LAB — LACTATE DEHYDROGENASE: LDH: 249 U/L — ABNORMAL HIGH (ref 98–192)

## 2018-11-04 NOTE — Telephone Encounter (Signed)
Call received from [place] with IR stating that the earliest port placement can be is 11/11/18 in the afternoon,  which is also patient's first scheduled treatment day.  Message sent to scheduling to reschedule chemo appts for 11/11/18 d/t port placement is on 11/11/18.

## 2018-11-05 ENCOUNTER — Ambulatory Visit: Payer: Medicare HMO | Admitting: Endocrinology

## 2018-11-05 ENCOUNTER — Inpatient Hospital Stay: Payer: Medicare HMO

## 2018-11-05 ENCOUNTER — Other Ambulatory Visit: Payer: Self-pay

## 2018-11-05 ENCOUNTER — Other Ambulatory Visit: Payer: Self-pay | Admitting: Family

## 2018-11-05 ENCOUNTER — Encounter: Payer: Self-pay | Admitting: *Deleted

## 2018-11-05 VITALS — BP 130/49 | HR 70 | Temp 98.0°F | Resp 20

## 2018-11-05 DIAGNOSIS — E785 Hyperlipidemia, unspecified: Secondary | ICD-10-CM | POA: Diagnosis not present

## 2018-11-05 DIAGNOSIS — C182 Malignant neoplasm of ascending colon: Secondary | ICD-10-CM | POA: Diagnosis not present

## 2018-11-05 DIAGNOSIS — E119 Type 2 diabetes mellitus without complications: Secondary | ICD-10-CM | POA: Diagnosis not present

## 2018-11-05 DIAGNOSIS — D500 Iron deficiency anemia secondary to blood loss (chronic): Secondary | ICD-10-CM

## 2018-11-05 DIAGNOSIS — D649 Anemia, unspecified: Secondary | ICD-10-CM | POA: Diagnosis not present

## 2018-11-05 DIAGNOSIS — Z87891 Personal history of nicotine dependence: Secondary | ICD-10-CM | POA: Diagnosis not present

## 2018-11-05 DIAGNOSIS — Z5111 Encounter for antineoplastic chemotherapy: Secondary | ICD-10-CM | POA: Diagnosis not present

## 2018-11-05 DIAGNOSIS — Z8673 Personal history of transient ischemic attack (TIA), and cerebral infarction without residual deficits: Secondary | ICD-10-CM | POA: Diagnosis not present

## 2018-11-05 DIAGNOSIS — I10 Essential (primary) hypertension: Secondary | ICD-10-CM | POA: Diagnosis not present

## 2018-11-05 DIAGNOSIS — C787 Secondary malignant neoplasm of liver and intrahepatic bile duct: Secondary | ICD-10-CM | POA: Diagnosis not present

## 2018-11-05 DIAGNOSIS — J449 Chronic obstructive pulmonary disease, unspecified: Secondary | ICD-10-CM | POA: Diagnosis not present

## 2018-11-05 MED ORDER — SODIUM CHLORIDE 0.9 % IV SOLN
Freq: Once | INTRAVENOUS | Status: AC
  Administered 2018-11-05: 12:00:00 via INTRAVENOUS
  Filled 2018-11-05: qty 250

## 2018-11-05 MED ORDER — SODIUM CHLORIDE 0.9 % IV SOLN
510.0000 mg | Freq: Once | INTRAVENOUS | Status: AC
  Administered 2018-11-05: 510 mg via INTRAVENOUS
  Filled 2018-11-05: qty 17

## 2018-11-05 NOTE — Progress Notes (Signed)
Patient here for chemotherapy education.  Patient in wheelchair, very clammy and head to side.  Unable to answer questions.  Daughter with her concerned.  Patient put in bed.  BP 54/41.  Spoke to [facility] NP who noted that iron was very low on last visit.  Ok to proceed with fluids and iron.  Patient BP better after 5 minutes and more alert.

## 2018-11-05 NOTE — Progress Notes (Unsigned)
Patient in chemotherapy education class with daughter [name].   Discussed side effects of  5FU, Oxaliplatin, Leucovorin   which include but are not limited to myelosuppression, decreased appetite, fatigue, fever, allergic or infusional reaction, mucositis, cardiac toxicity, cough, SOB, altered taste, nausea and vomiting, diarrhea, constipation, elevated LFTs myalgia and arthralgias, hair loss or thinning, rash, skin dryness, nail changes, peripheral neuropathy, discolored urine, delayed wound healing, mental changes (Chemo brain), increased risk of infections, weight loss.  Reviewed infusion room and office policy and procedure and phone numbers 24 hours x 7 days a week.  Reviewed ambulatory pump specifics and how to manage safe handling at home.  Reviewed when to call the office with any concerns or problems.  Scientist, clinical (histocompatibility and immunogenetics) given.  Discussed portacath insertion and EMLA cream administration.  Antiemetic protocol and chemotherapy schedule reviewed. Patient verbalized understanding of chemotherapy indications and possible side effects.  Teachback done

## 2018-11-05 NOTE — Progress Notes (Signed)
NPR for [facility] per Otilio Carpen, Financial Advocate.

## 2018-11-05 NOTE — Patient Instructions (Signed)

## 2018-11-07 ENCOUNTER — Other Ambulatory Visit: Payer: Self-pay | Admitting: *Deleted

## 2018-11-07 ENCOUNTER — Other Ambulatory Visit: Payer: Self-pay | Admitting: Hematology & Oncology

## 2018-11-07 ENCOUNTER — Other Ambulatory Visit (HOSPITAL_COMMUNITY): Payer: Self-pay | Admitting: Family Medicine

## 2018-11-07 ENCOUNTER — Other Ambulatory Visit: Payer: Self-pay | Admitting: Radiology

## 2018-11-07 DIAGNOSIS — C189 Malignant neoplasm of colon, unspecified: Secondary | ICD-10-CM

## 2018-11-07 DIAGNOSIS — Z1231 Encounter for screening mammogram for malignant neoplasm of breast: Secondary | ICD-10-CM

## 2018-11-07 MED ORDER — ONDANSETRON HCL 8 MG PO TABS: 8.0000 mg | ORAL_TABLET | Freq: Two times a day (BID) | ORAL | 1 refills | Status: DC | PRN

## 2018-11-07 MED ORDER — LIDOCAINE-PRILOCAINE 2.5-2.5 % EX CREA: TOPICAL_CREAM | CUTANEOUS | 3 refills | Status: DC

## 2018-11-07 MED ORDER — PROCHLORPERAZINE MALEATE 10 MG PO TABS: 10.0000 mg | ORAL_TABLET | Freq: Four times a day (QID) | ORAL | 1 refills | Status: DC | PRN

## 2018-11-07 MED ORDER — DEXAMETHASONE 4 MG PO TABS: 8.0000 mg | ORAL_TABLET | Freq: Every day | ORAL | 1 refills | Status: DC

## 2018-11-08 ENCOUNTER — Other Ambulatory Visit: Payer: Self-pay | Admitting: Radiology

## 2018-11-08 ENCOUNTER — Ambulatory Visit (HOSPITAL_COMMUNITY)
Admission: RE | Admit: 2018-11-08 | Discharge: 2018-11-08 | Disposition: A | Discharge status: Home / Self Care | Payer: Medicare HMO | Source: Ambulatory Visit | Attending: Hematology & Oncology | Admitting: Hematology & Oncology

## 2018-11-08 ENCOUNTER — Other Ambulatory Visit: Payer: Self-pay

## 2018-11-08 DIAGNOSIS — Z1231 Encounter for screening mammogram for malignant neoplasm of breast: Secondary | ICD-10-CM

## 2018-11-11 ENCOUNTER — Inpatient Hospital Stay: Payer: Medicare HMO

## 2018-11-11 ENCOUNTER — Other Ambulatory Visit: Payer: Self-pay

## 2018-11-11 ENCOUNTER — Other Ambulatory Visit: Payer: Self-pay | Admitting: Family

## 2018-11-11 ENCOUNTER — Ambulatory Visit (HOSPITAL_COMMUNITY)
Admission: RE | Admit: 2018-11-11 | Discharge: 2018-11-11 | Disposition: A | Discharge status: Home / Self Care | Payer: Medicare HMO | Source: Ambulatory Visit | Attending: Hematology & Oncology | Admitting: Hematology & Oncology

## 2018-11-11 ENCOUNTER — Other Ambulatory Visit: Payer: Self-pay | Admitting: Hematology & Oncology

## 2018-11-11 ENCOUNTER — Encounter (HOSPITAL_COMMUNITY): Payer: Self-pay

## 2018-11-11 DIAGNOSIS — Z79899 Other long term (current) drug therapy: Secondary | ICD-10-CM | POA: Insufficient documentation

## 2018-11-11 DIAGNOSIS — E785 Hyperlipidemia, unspecified: Secondary | ICD-10-CM | POA: Insufficient documentation

## 2018-11-11 DIAGNOSIS — Z888 Allergy status to other drugs, medicaments and biological substances status: Secondary | ICD-10-CM | POA: Insufficient documentation

## 2018-11-11 DIAGNOSIS — C189 Malignant neoplasm of colon, unspecified: Secondary | ICD-10-CM | POA: Insufficient documentation

## 2018-11-11 DIAGNOSIS — Z8249 Family history of ischemic heart disease and other diseases of the circulatory system: Secondary | ICD-10-CM | POA: Insufficient documentation

## 2018-11-11 DIAGNOSIS — J449 Chronic obstructive pulmonary disease, unspecified: Secondary | ICD-10-CM | POA: Insufficient documentation

## 2018-11-11 DIAGNOSIS — Z7982 Long term (current) use of aspirin: Secondary | ICD-10-CM | POA: Diagnosis not present

## 2018-11-11 DIAGNOSIS — Z90710 Acquired absence of both cervix and uterus: Secondary | ICD-10-CM | POA: Diagnosis not present

## 2018-11-11 DIAGNOSIS — E119 Type 2 diabetes mellitus without complications: Secondary | ICD-10-CM | POA: Diagnosis not present

## 2018-11-11 DIAGNOSIS — Z452 Encounter for adjustment and management of vascular access device: Secondary | ICD-10-CM | POA: Diagnosis not present

## 2018-11-11 DIAGNOSIS — K219 Gastro-esophageal reflux disease without esophagitis: Secondary | ICD-10-CM | POA: Insufficient documentation

## 2018-11-11 DIAGNOSIS — Z886 Allergy status to analgesic agent status: Secondary | ICD-10-CM | POA: Diagnosis not present

## 2018-11-11 DIAGNOSIS — C61 Malignant neoplasm of prostate: Secondary | ICD-10-CM | POA: Diagnosis not present

## 2018-11-11 DIAGNOSIS — Z833 Family history of diabetes mellitus: Secondary | ICD-10-CM | POA: Diagnosis not present

## 2018-11-11 DIAGNOSIS — Z87891 Personal history of nicotine dependence: Secondary | ICD-10-CM | POA: Diagnosis not present

## 2018-11-11 DIAGNOSIS — C787 Secondary malignant neoplasm of liver and intrahepatic bile duct: Secondary | ICD-10-CM

## 2018-11-11 DIAGNOSIS — Z885 Allergy status to narcotic agent status: Secondary | ICD-10-CM | POA: Insufficient documentation

## 2018-11-11 DIAGNOSIS — I10 Essential (primary) hypertension: Secondary | ICD-10-CM | POA: Diagnosis not present

## 2018-11-11 DIAGNOSIS — Z85038 Personal history of other malignant neoplasm of large intestine: Secondary | ICD-10-CM | POA: Diagnosis not present

## 2018-11-11 DIAGNOSIS — Z8673 Personal history of transient ischemic attack (TIA), and cerebral infarction without residual deficits: Secondary | ICD-10-CM | POA: Diagnosis not present

## 2018-11-11 HISTORY — PX: IR IMAGING GUIDED PORT INSERTION: IMG5740

## 2018-11-11 LAB — CBC WITH DIFFERENTIAL/PLATELET
Abs Immature Granulocytes: 0.03 10*3/uL (ref 0.00–0.07)
Basophils Absolute: 0 10*3/uL (ref 0.0–0.1)
Basophils Relative: 0 %
Eosinophils Absolute: 0.1 10*3/uL (ref 0.0–0.5)
Eosinophils Relative: 1 %
HCT: 37.2 % (ref 36.0–46.0)
Hemoglobin: 10.4 g/dL — ABNORMAL LOW (ref 12.0–15.0)
Immature Granulocytes: 0 %
Lymphocytes Relative: 21 %
Lymphs Abs: 2 10*3/uL (ref 0.7–4.0)
MCH: 20.8 pg — ABNORMAL LOW (ref 26.0–34.0)
MCHC: 28 g/dL — ABNORMAL LOW (ref 30.0–36.0)
MCV: 74.5 fL — ABNORMAL LOW (ref 80.0–100.0)
Monocytes Absolute: 0.9 10*3/uL (ref 0.1–1.0)
Monocytes Relative: 10 %
Neutro Abs: 6.2 10*3/uL (ref 1.7–7.7)
Neutrophils Relative %: 68 %
Platelets: 302 10*3/uL (ref 150–400)
RBC: 4.99 MIL/uL (ref 3.87–5.11)
RDW: 28.6 % — ABNORMAL HIGH (ref 11.5–15.5)
WBC: 9.2 10*3/uL (ref 4.0–10.5)
nRBC: 0 % (ref 0.0–0.2)

## 2018-11-11 LAB — PROTIME-INR
INR: 1 (ref 0.8–1.2)
Prothrombin Time: 13.3 seconds (ref 11.4–15.2)

## 2018-11-11 LAB — GLUCOSE, CAPILLARY: Glucose-Capillary: 174 mg/dL — ABNORMAL HIGH (ref 70–99)

## 2018-11-11 MED ORDER — FENTANYL CITRATE (PF) 100 MCG/2ML IJ SOLN
INTRAMUSCULAR | Status: AC | PRN
  Administered 2018-11-11: 50 ug via INTRAVENOUS

## 2018-11-11 MED ORDER — SODIUM CHLORIDE 0.9 % IV SOLN
INTRAVENOUS | Status: DC
  Administered 2018-11-11: 14:00:00 via INTRAVENOUS

## 2018-11-11 MED ORDER — CEFAZOLIN SODIUM-DEXTROSE 2-4 GM/100ML-% IV SOLN
2.0000 g | INTRAVENOUS | Status: AC
  Administered 2018-11-11: 2 g via INTRAVENOUS

## 2018-11-11 MED ORDER — HEPARIN SOD (PORK) LOCK FLUSH 100 UNIT/ML IV SOLN
INTRAVENOUS | Status: AC
  Filled 2018-11-11: qty 5

## 2018-11-11 MED ORDER — CEFAZOLIN SODIUM-DEXTROSE 2-4 GM/100ML-% IV SOLN
INTRAVENOUS | Status: AC
  Administered 2018-11-11: 2 g via INTRAVENOUS
  Filled 2018-11-11: qty 100

## 2018-11-11 MED ORDER — LIDOCAINE-EPINEPHRINE (PF) 2 %-1:200000 IJ SOLN
INTRAMUSCULAR | Status: AC
  Filled 2018-11-11: qty 20

## 2018-11-11 MED ORDER — FENTANYL CITRATE (PF) 100 MCG/2ML IJ SOLN
INTRAMUSCULAR | Status: AC
  Filled 2018-11-11: qty 2

## 2018-11-11 MED ORDER — LIDOCAINE-EPINEPHRINE (PF) 1 %-1:200000 IJ SOLN
INTRAMUSCULAR | Status: AC | PRN
  Administered 2018-11-11: 10 mL

## 2018-11-11 MED ORDER — MIDAZOLAM HCL 2 MG/2ML IJ SOLN
INTRAMUSCULAR | Status: AC
  Filled 2018-11-11: qty 4

## 2018-11-11 MED ORDER — MIDAZOLAM HCL 2 MG/2ML IJ SOLN
INTRAMUSCULAR | Status: AC | PRN
  Administered 2018-11-11 (×2): 1 mg via INTRAVENOUS

## 2018-11-11 NOTE — Procedures (Signed)
Interventional Radiology Procedure Note  Procedure: Placement of a right IJ approach single lumen PowerPort.  Tip is positioned at the superior cavoatrial junction and catheter is ready for immediate use.  Complications: No immediate Recommendations:  - Ok to shower tomorrow - Do not submerge for 7 days - Routine line care   Signed,  Jalaiya Oyster K. Colt Martelle, MD   

## 2018-11-11 NOTE — Discharge Instructions (Signed)
Implanted Port Insertion, Care After °This sheet gives you information about how to care for yourself after your procedure. Your health care provider may also give you more specific instructions. If you have problems or questions, contact your health care provider. °What can I expect after the procedure? °After the procedure, it is common to have: °· Discomfort at the port insertion site. °· Bruising on the skin over the port. This should improve over 3-4 days. °Follow these instructions at home: °Port care °· After your port is placed, you will get a manufacturer's information card. The card has information about your port. Keep this card with you at all times. °· Take care of the port as told by your health care provider. Ask your health care provider if you or a family member can get training for taking care of the port at home. A home health care nurse may also take care of the port. °· Make sure to remember what type of port you have. °Incision care ° °  ° °· Follow instructions from your health care provider about how to take care of your port insertion site. Make sure you: °? Wash your hands with soap and water before and after you change your bandage (dressing). If soap and water are not available, use hand sanitizer. °? Change your dressing as told by your health care provider. °? Leave stitches (sutures), skin glue, or adhesive strips in place. These skin closures may need to stay in place for 2 weeks or longer. If adhesive strip edges start to loosen and curl up, you may trim the loose edges. Do not remove adhesive strips completely unless your health care provider tells you to do that. °· Check your port insertion site every day for signs of infection. Check for: °? Redness, swelling, or pain. °? Fluid or blood. °? Warmth. °? Pus or a bad smell. °Activity °· Return to your normal activities as told by your health care provider. Ask your health care provider what activities are safe for you. °· Do not  lift anything that is heavier than 10 lb (4.5 kg), or the limit that you are told, until your health care provider says that it is safe. °General instructions °· Take over-the-counter and prescription medicines only as told by your health care provider. °· Do not take baths, swim, or use a hot tub until your health care provider approves. Ask your health care provider if you may take showers. You may only be allowed to take sponge baths. °· Do not drive for 24 hours if you were given a sedative during your procedure. °· Wear a medical alert bracelet in case of an emergency. This will tell any health care providers that you have a port. °· Keep all follow-up visits as told by your health care provider. This is important. °Contact a health care provider if: °· You cannot flush your port with saline as directed, or you cannot draw blood from the port. °· You have a fever or chills. °· You have redness, swelling, or pain around your port insertion site. °· You have fluid or blood coming from your port insertion site. °· Your port insertion site feels warm to the touch. °· You have pus or a bad smell coming from the port insertion site. °Get help right away if: °· You have chest pain or shortness of breath. °· You have bleeding from your port that you cannot control. °Summary °· Take care of the port as told by your health   care provider. Keep the manufacturer's information card with you at all times. °· Change your dressing as told by your health care provider. °· Contact a health care provider if you have a fever or chills or if you have redness, swelling, or pain around your port insertion site. °· Keep all follow-up visits as told by your health care provider. °This information is not intended to replace advice given to you by your health care provider. Make sure you discuss any questions you have with your health care provider. °Document Released: 01/29/2013 Document Revised: 11/06/2017 Document Reviewed:  11/06/2017 °Elsevier Patient Education © 2020 [employer] ° °Moderate Conscious Sedation, Adult, Care After °These instructions provide you with information about caring for yourself after your procedure. Your health care provider may also give you more specific instructions. Your treatment has been planned according to current medical practices, but problems sometimes occur. Call your health care provider if you have any problems or questions after your procedure. °What can I expect after the procedure? °After your procedure, it is common: °· To feel sleepy for several hours. °· To feel clumsy and have poor balance for several hours. °· To have poor judgment for several hours. °· To vomit if you eat too soon. °Follow these instructions at home: °For at least 24 hours after the procedure: ° °· Do not: °? Participate in activities where you could fall or become injured. °? Drive. °? Use heavy machinery. °? Drink alcohol. °? Take sleeping pills or medicines that cause drowsiness. °? Make important decisions or sign legal documents. °? Take care of children on your own. °· Rest. °Eating and drinking °· Follow the diet recommended by your health care provider. °· If you vomit: °? Drink water, juice, or soup when you can drink without vomiting. °? Make sure you have little or no nausea before eating solid foods. °General instructions °· Have a responsible adult stay with you until you are awake and alert. °· Take over-the-counter and prescription medicines only as told by your health care provider. °· If you smoke, do not smoke without supervision. °· Keep all follow-up visits as told by your health care provider. This is important. °Contact a health care provider if: °· You keep feeling nauseous or you keep vomiting. °· You feel light-headed. °· You develop a rash. °· You have a fever. °Get help right away if: °· You have trouble breathing. °This information is not intended to replace advice given to you by your  health care provider. Make sure you discuss any questions you have with your health care provider. °Document Released: 01/29/2013 Document Revised: 03/23/2017 Document Reviewed: 07/31/2015 °Elsevier Patient Education © 2020 [employer] ° °

## 2018-11-11 NOTE — Consult Note (Signed)
Chief Complaint: Patient was seen in consultation today for Port-A-Cath placement  Referring Physician(s): Ennever,Peter R  Supervising Physician: Jacqulynn Cadet  Patient Status: Tonya [name]  History of Present Illness: Tonya [name] is an 78 y.o. female with past medical history significant for prior stroke with residual left-sided weakness, COPD, diabetes, GERD, hypertension, hyperlipidemia and stage IV metastatic colon cancer.  She has poor venous access and presents today for Port-A-Cath placement for additional treatment.  Past Medical History:  Diagnosis Date   Cerebrovascular accident [facility])    Chronic edema    ? venous insufficiency   COPD (chronic obstructive pulmonary disease) ([place])    never had a problem breathing   Diabetes mellitus without complication ([place])    Enlarged heart    per pt   Gallstones    GERD (gastroesophageal reflux disease)    Goals of care, counseling/discussion 11/01/2018   Hyperlipidemia    Hypertension     Past Surgical History:  Procedure Laterality Date   ABDOMINAL HYSTERECTOMY     BIOPSY  10/18/2018   Procedure: BIOPSY;  Surgeon: Jackquline Denmark, MD;  Location: [facility];  Service: Endoscopy;;   CHOLECYSTECTOMY     COLONOSCOPY WITH PROPOFOL N/A 10/18/2018   Procedure: COLONOSCOPY WITH PROPOFOL;  Surgeon: Jackquline Denmark, MD;  Location: [facility];  Service: Endoscopy;  Laterality: N/A;   POLYPECTOMY  10/18/2018   Procedure: POLYPECTOMY;  Surgeon: Jackquline Denmark, MD;  Location: [facility] ENDOSCOPY;  Service: Endoscopy;;   SUBMUCOSAL TATTOO INJECTION  10/18/2018   Procedure: SUBMUCOSAL TATTOO INJECTION;  Surgeon: Jackquline Denmark, MD;  Location: [facility] ENDOSCOPY;  Service: Endoscopy;;    Allergies: Iohexol, Hydrocodone, Ibuprofen, Naproxen, and Propoxyphene n-acetaminophen  Medications: Prior to Admission medications   Medication Sig Start Date End Date Taking? Authorizing Provider  amLODipine (NORVASC) 10 MG tablet Take 1  tablet (10 mg total) by mouth daily. Patient not taking: Reported on 10/31/2018 10/17/18   Kayleen Memos, DO  amLODipine (NORVASC) 5 MG tablet  10/16/18   [provider]  aspirin EC 81 MG tablet Take 81 mg by mouth daily.    [provider]  atorvastatin (LIPITOR) 10 MG tablet TAKE 1 TABLET(10 MG) BY MOUTH DAILY Patient taking differently: Take 10 mg by mouth daily.  06/26/18   Forrest Moron, MD  Blood Glucose Monitoring Suppl (ONETOUCH VERIO) w/Device KIT Prick finger 2 times a day to test blood glucose level 06/26/18   Forrest Moron, MD  Blood Pressure Monitoring [facility]) MISC 1 Units by Does not apply route every morning. 09/17/18   Adrian Prows, MD  Cholecalciferol 2000 units TABS Take 1 tablet (2,000 Units total) by mouth daily. Patient not taking: Reported on 10/31/2018 07/17/17   Janith Lima, MD  dexamethasone (DECADRON) 4 MG tablet Take 2 tablets (8 mg total) by mouth daily. Start the day after chemotherapy for 2 days. Take with food. 11/07/18   [name], MD  hydrochlorothiazide (HYDRODIURIL) 25 MG tablet Take 25 mg by mouth daily.    [provider]  Lancets (ONETOUCH ULTRASOFT) lancets Ultra soft smooth 33 gauge. Prick finger 2 times a day to test blood glucose level Patient taking differently: 1 each by Other route 2 (two) times a day. One Touch Verio smooth 33 gauge 08/07/18   Delia Chimes A, MD  lidocaine-prilocaine (EMLA) cream Apply to Portacath 1-2 hours prior to chemotherapy or blood draw. 11/07/18   [name], MD  metoprolol tartrate (LOPRESSOR) 25 MG tablet Take 0.5 tablets (  12.5 mg total) by mouth 2 (two) times daily. 09/02/18   Forrest Moron, MD  ondansetron (ZOFRAN) 8 MG tablet Take 1 tablet (8 mg total) by mouth 2 (two) times daily as needed for refractory nausea / vomiting. Start on day 3 after chemotherapy. 11/07/18   [name], MD  [facility] VERIO test strip 1 each by Other route 3 (three) times daily. USE TO TEST  BLOOD SUGAR UP TO QID 05/24/18   Horald Pollen, MD  prochlorperazine (COMPAZINE) 10 MG tablet Take 1 tablet (10 mg total) by mouth every 6 (six) hours as needed (Nausea or vomiting). 11/07/18   [name], MD  sitaGLIPtin (JANUVIA) 50 MG tablet Take 1 tablet (50 mg total) by mouth daily. Patient taking differently: Take 25 mg by mouth 2 (two) times a day.  08/26/18   Renato Shin, MD     Family History  Problem Relation Age of Onset   Cirrhosis Mother    Heart disease Father    Cancer Other    Hypertension Other    Diabetes Other     Social History   Socioeconomic History   Marital status: Widowed    Spouse name: Not on file   Number of children: 5   Years of education: Not on file   Highest education level: Not on file  Occupational History   Not on file  Social Needs   Financial resource strain: Not on file   Food insecurity    Worry: Not on file    Inability: Not on file   Transportation needs    Medical: Not on file    Non-medical: Not on file  Tobacco Use   Smoking status: Former Smoker    Packs/day: 0.25    Years: 1.00    Pack years: 0.25    Quit date: 07/23/1968    Years since quitting: 50.3   Smokeless tobacco: Never Used  Substance and Sexual Activity   Alcohol use: No    Alcohol/week: 0.0 standard drinks   Drug use: No   Sexual activity: Not Currently  Lifestyle   Physical activity    Days per week: Not on file    Minutes per session: Not on file   Stress: Not on file  Relationships   Social connections    Talks on phone: Not on file    Gets together: Not on file    Attends religious service: Not on file    Active member of club or organization: Not on file    Attends meetings of clubs or organizations: Not on file    Relationship status: Not on file  Other Topics Concern   Not on file  Social History Narrative   Not on file      Review of Systems currently denies fever, headache, chest pain, cough,  abdominal/back pain, nausea, vomiting or bleeding.  She does have some dyspnea with exertion and left-sided weakness.  Vital Signs: BP (!) 146/92    Pulse (!) 59    Temp 98.1 F (36.7 C) (Oral)    Resp 16    SpO2 97%   Physical Exam awake, alert.  Chest clear to auscultation bilaterally.  Heart with slightly bradycardic rate, irregular rhythm.  Abdomen soft, positive bowel sounds, nontender.  No pretibial edema bilaterally.  Left-sided weakness noted.  Imaging: Ct Abdomen Pelvis Wo Contrast  Result Date: 10/16/2018 CLINICAL DATA:  Anemia, unexplained, post transfusion EXAM: CT ABDOMEN AND PELVIS WITHOUT CONTRAST TECHNIQUE: Multidetector CT imaging of  the abdomen and pelvis was performed following the standard protocol without IV contrast. COMPARISON:  CT abdomen pelvis, 11/15/2006 FINDINGS: Lower chest: Trace bilateral pleural effusions and associated atelectasis or consolidation Hepatobiliary: There is a large, hypodense mass of the right lobe of the liver measuring at least 5.9 x 5.0 cm (series 3, image 27). Status post cholecystic Pancreas: Unremarkable. No pancreatic ductal dilatation or surrounding inflammatory changes. Spleen: Normal in size. Fluid attenuation cystic lesion of the spleen not significantly changed compared to prior examination dated 2000. Adrenals/Urinary Tract: Adrenal glands are unremarkable. Kidneys are normal, without renal calculi, solid lesion, or hydronephrosis. Bladder is unremarkable. Stomach/Bowel: Stomach is within normal limits. Appendix appears normal. There is a large, circumferential mass of the proximal cecum just above ileocecal valve (series 3, image 55, series 6, image 39). Vascular/Lymphatic: Aortic atherosclerosis. There are enlarged lymph nodes of the right mesocolon measuring up to 2.1 cm (series 3, image 47). Reproductive: Status post hysterectomy. Other: No abdominal wall hernia or abnormality. No abdominopelvic ascites. Musculoskeletal: No acute or  significant osseous findings. IMPRESSION: 1. There is a large, circumferential mass of the proximal cecum just above ileocecal valve (series 3, image 55, series 6, image 39). There are enlarged lymph nodes of the right mesocolon measuring up to 2.1 cm (series 3, image 47). Findings are consistent with primary colon malignancy. 2. There is a large, hypodense mass of the right lobe of the liver measuring at least 5.9 x 5.0 cm (series 3, image 27). This is new in comparison to remote prior CT dated 2008 and concerning for metastatic lesion. 3. Other chronic, incidental, and postoperative findings as detailed above. Electronically Signed   By: Eddie Candle M.D.   On: 10/16/2018 11:24   Ct Chest Wo Contrast  Result Date: 10/18/2018 CLINICAL DATA:  Colon cancer staging. EXAM: CT CHEST WITHOUT CONTRAST TECHNIQUE: Multidetector CT imaging of the chest was performed following the standard protocol without IV contrast. COMPARISON:  Abdominopelvic CT 10/16/2018. Chest radiographs 07/02/2013. FINDINGS: Cardiovascular: Atherosclerosis of the aorta, great vessels and coronary arteries. The heart size is normal. There is no pericardial effusion. Mediastinum/Nodes: There are no enlarged mediastinal, hilar or axillary lymph nodes.Hilar assessment is limited by the lack of intravenous contrast. The thyroid gland, trachea and esophagus demonstrate no significant findings. Lungs/Pleura: There is no pleural effusion or pneumothorax. A part solid nodule is present medially in the right lower lobe, measuring 6 mm on image 72/4. No other nodules are identified. However, there is a focal subpleural opacity along the posterolateral aspect of the descending aorta, just posterior to the left hilum which is most obvious on the reformatted images. This measures up to 3.2 cm on coronal image 61/5. Upper abdomen: A large mass centrally in the right hepatic lobe measuring up to 5.9 cm on image 153/3 is again noted, likely a metastasis.  Low-density 3.3 cm splenic lesion is likely a cyst based on stability with older prior studies. Musculoskeletal/Chest wall: There is no chest wall mass or suspicious osseous finding. Mild degenerative changes in the spine. IMPRESSION: 1. No thoracic findings typical for metastatic colon cancer. 2. Indeterminate subpleural left lower lobe density between the left hilum and descending aorta could reflect an ill-defined mass, postinflammatory scarring or rounded atelectasis. This is unlikely to be related to the patient's colon cancer, although could reflect primary lung cancer. Consider further evaluation with PET-CT. Alternatively, follow-up chest CT in 3-6 months could be performed. 3. Sub solid right lower lobe pulmonary nodule. Given the personal history  of malignancy, Fleischner criteria do not apply. Attention on follow-up recommended. 4.  Aortic Atherosclerosis (ICD10-I70.0). Electronically Signed   By: Richardean Sale M.D.   On: 10/18/2018 14:27   Mm 3d Screen Breast Bilateral  Result Date: 11/11/2018 CLINICAL DATA:  Screening. EXAM: DIGITAL SCREENING BILATERAL MAMMOGRAM WITH TOMO AND CAD COMPARISON:  Previous exam(s). ACR Breast Density Category b: There are scattered areas of fibroglandular density. FINDINGS: There are no findings suspicious for malignancy. Images were processed with CAD. IMPRESSION: No mammographic evidence of malignancy. A result letter of this screening mammogram will be mailed directly to the patient. RECOMMENDATION: Screening mammogram in one year. (Code:SM-B-01Y) BI-RADS CATEGORY  1: Negative. Electronically Signed   By: Curlene Dolphin M.D.   On: 11/11/2018 10:16    Labs:  CBC: Recent Labs    10/16/18 0453 10/17/18 0652 10/18/18 0641 11/01/18 1434  WBC 7.1 7.4 8.7 9.0  HGB 8.8* 8.8* 10.3* 9.5*  HCT 29.6* 30.1* 35.8* 33.5*  PLT 273 275 313 338    COAGS: Recent Labs    10/16/18 0453  INR 1.1    BMP: Recent Labs    10/15/18 1225 10/16/18 0453 10/17/18 0652  11/01/18 1434  NA 140 139 139 141  K 3.9 3.6 3.5 3.8  CL 107 107 108 105  CO2 _0 GLUCOSE 168* 172* 187* 206*  BUN _1 CALCIUM 9.1 8.8* 8.7* 8.9  CREATININE 0.75 0.67 0.62 0.63  GFRNONAA >60 >60 >60 >60  GFRAA >60 >60 >60 >60    LIVER FUNCTION TESTS: Recent Labs    03/30/18 1201 10/15/18 1225 10/17/18 0652 11/01/18 1434  BILITOT 0.5 0.5 1.0 0.5  AST _2 ALT _3 ALKPHOS 75 82 74 99  PROT 6.4 6.2* 5.8* 6.4*  ALBUMIN 3.9 3.0* 2.7* 3.7    TUMOR MARKERS: No results for input(s): AFPTM, CEA, CA199, CHROMGRNA in the last 8760 hours.  Assessment and Plan: 78 y.o. female with past medical history significant for prior stroke with residual left-sided weakness, COPD, diabetes, GERD, hypertension, hyperlipidemia and stage IV metastatic colon cancer.  She has poor venous access and presents today for Port-A-Cath placement for additional treatment.Risks and benefits of image guided port-a-catheter placement was discussed with the patient including, but not limited to bleeding, infection, pneumothorax, or fibrin sheath development and need for additional procedures.  All of the patient's questions were answered, patient is agreeable to proceed. Consent signed and in chart.  LABS PENDING   Thank you for this interesting consult.  I greatly enjoyed meeting Tonya [name] and look forward to participating in their care.  A copy of this report was sent to the requesting provider on this date.  Electronically Signed: D. Rowe Robert, PA-C 11/11/2018, 1:28 PM   I spent a total of 25 minutes in face to face in clinical consultation, greater than 50% of which was counseling/coordinating care for Port-A-Cath placement

## 2018-11-12 ENCOUNTER — Telehealth: Payer: Self-pay | Admitting: *Deleted

## 2018-11-12 NOTE — Telephone Encounter (Signed)
Message received from patient's daughter, [name] requesting a call back regarding port placed on 11/11/18.  Call placed back to patient's daughter [name] and [name] states that patient is having "bad pain" at port site and that she is on her way to visit patient now to give her Advil.  She states that patient has taken nothing for pain since port placement and would like to "put chemo tx off by one day."  Instructed [name] that if tx is put off, appt will need to be moved until next week d/t scheduling pump d/c.  [name] states that she will bring pt in as scheduled tomorrow for chemo treatment.  [name] appreciative of call back and has no further questions or concerns at this time.

## 2018-11-13 ENCOUNTER — Inpatient Hospital Stay: Payer: Medicare HMO

## 2018-11-13 ENCOUNTER — Other Ambulatory Visit: Payer: Self-pay

## 2018-11-13 ENCOUNTER — Other Ambulatory Visit: Payer: Self-pay | Admitting: Hematology & Oncology

## 2018-11-13 VITALS — BP 140/69 | HR 69

## 2018-11-13 DIAGNOSIS — C189 Malignant neoplasm of colon, unspecified: Secondary | ICD-10-CM

## 2018-11-13 DIAGNOSIS — I10 Essential (primary) hypertension: Secondary | ICD-10-CM | POA: Diagnosis not present

## 2018-11-13 DIAGNOSIS — D500 Iron deficiency anemia secondary to blood loss (chronic): Secondary | ICD-10-CM

## 2018-11-13 DIAGNOSIS — C787 Secondary malignant neoplasm of liver and intrahepatic bile duct: Secondary | ICD-10-CM | POA: Diagnosis not present

## 2018-11-13 DIAGNOSIS — E119 Type 2 diabetes mellitus without complications: Secondary | ICD-10-CM | POA: Diagnosis not present

## 2018-11-13 DIAGNOSIS — D649 Anemia, unspecified: Secondary | ICD-10-CM | POA: Diagnosis not present

## 2018-11-13 DIAGNOSIS — Z87891 Personal history of nicotine dependence: Secondary | ICD-10-CM | POA: Diagnosis not present

## 2018-11-13 DIAGNOSIS — Z5111 Encounter for antineoplastic chemotherapy: Secondary | ICD-10-CM | POA: Diagnosis not present

## 2018-11-13 DIAGNOSIS — J449 Chronic obstructive pulmonary disease, unspecified: Secondary | ICD-10-CM | POA: Diagnosis not present

## 2018-11-13 DIAGNOSIS — C182 Malignant neoplasm of ascending colon: Secondary | ICD-10-CM | POA: Diagnosis not present

## 2018-11-13 DIAGNOSIS — E785 Hyperlipidemia, unspecified: Secondary | ICD-10-CM | POA: Diagnosis not present

## 2018-11-13 DIAGNOSIS — Z8673 Personal history of transient ischemic attack (TIA), and cerebral infarction without residual deficits: Secondary | ICD-10-CM | POA: Diagnosis not present

## 2018-11-13 LAB — CMP (CANCER CENTER ONLY)
ALT: 13 U/L (ref 0–44)
AST: 19 U/L (ref 15–41)
Albumin: 3.3 g/dL — ABNORMAL LOW (ref 3.5–5.0)
Alkaline Phosphatase: 89 U/L (ref 38–126)
Anion gap: 8 (ref 5–15)
BUN: 12 mg/dL (ref 8–23)
CO2: 28 mmol/L (ref 22–32)
Calcium: 8.4 mg/dL — ABNORMAL LOW (ref 8.9–10.3)
Chloride: 103 mmol/L (ref 98–111)
Creatinine: 0.69 mg/dL (ref 0.44–1.00)
GFR, Est AFR Am: >60 mL/min (ref >60)
GFR, Estimated: >60 mL/min (ref >60)
Glucose, Bld: 235 mg/dL — ABNORMAL HIGH (ref 70–99)
Potassium: 3.6 mmol/L (ref 3.5–5.1)
Sodium: 139 mmol/L (ref 135–145)
Total Bilirubin: 0.4 mg/dL (ref 0.3–1.2)
Total Protein: 6 g/dL — ABNORMAL LOW (ref 6.5–8.1)

## 2018-11-13 LAB — CBC WITH DIFFERENTIAL (CANCER CENTER ONLY)
Abs Immature Granulocytes: 0.03 10*3/uL (ref 0.00–0.07)
Basophils Absolute: 0 10*3/uL (ref 0.0–0.1)
Basophils Relative: 0 %
Eosinophils Absolute: 0.1 10*3/uL (ref 0.0–0.5)
Eosinophils Relative: 2 %
HCT: 32.2 % — ABNORMAL LOW (ref 36.0–46.0)
Hemoglobin: 9.3 g/dL — ABNORMAL LOW (ref 12.0–15.0)
Immature Granulocytes: 0 %
Lymphocytes Relative: 19 %
Lymphs Abs: 1.4 10*3/uL (ref 0.7–4.0)
MCH: 20.9 pg — ABNORMAL LOW (ref 26.0–34.0)
MCHC: 28.9 g/dL — ABNORMAL LOW (ref 30.0–36.0)
MCV: 72.4 fL — ABNORMAL LOW (ref 80.0–100.0)
Monocytes Absolute: 0.6 10*3/uL (ref 0.1–1.0)
Monocytes Relative: 8 %
Neutro Abs: 5.3 10*3/uL (ref 1.7–7.7)
Neutrophils Relative %: 71 %
Platelet Count: 262 10*3/uL (ref 150–400)
RBC: 4.45 MIL/uL (ref 3.87–5.11)
RDW: 27.9 % — ABNORMAL HIGH (ref 11.5–15.5)
WBC Count: 7.4 10*3/uL (ref 4.0–10.5)
nRBC: 0 % (ref 0.0–0.2)

## 2018-11-13 MED ORDER — DEXTROSE 5 % IV SOLN
Freq: Once | INTRAVENOUS | Status: AC
  Administered 2018-11-13: 13:00:00 via INTRAVENOUS
  Filled 2018-11-13: qty 250

## 2018-11-13 MED ORDER — SODIUM CHLORIDE 0.9 % IV SOLN
510.0000 mg | Freq: Once | INTRAVENOUS | Status: AC
  Administered 2018-11-13: 510 mg via INTRAVENOUS
  Filled 2018-11-13: qty 510

## 2018-11-13 MED ORDER — DEXTROSE 5 % IV SOLN
Freq: Once | INTRAVENOUS | Status: AC
  Administered 2018-11-13: 14:00:00 via INTRAVENOUS
  Filled 2018-11-13: qty 250

## 2018-11-13 MED ORDER — SODIUM CHLORIDE 0.9% FLUSH
10.0000 mL | Freq: Once | INTRAVENOUS | Status: AC | PRN
  Administered 2018-11-13: 10 mL
  Filled 2018-11-13: qty 10

## 2018-11-13 MED ORDER — LEUCOVORIN CALCIUM INJECTION 350 MG
400.0000 mg/m2 | Freq: Once | INTRAVENOUS | Status: AC
  Administered 2018-11-13: 776 mg via INTRAVENOUS
  Filled 2018-11-13: qty 38.8

## 2018-11-13 MED ORDER — HEPARIN SOD (PORK) LOCK FLUSH 100 UNIT/ML IV SOLN
250.0000 [IU] | Freq: Once | INTRAVENOUS | Status: DC | PRN
  Filled 2018-11-13: qty 5

## 2018-11-13 MED ORDER — FLUOROURACIL CHEMO INJECTION 2.5 GM/50ML
320.0000 mg/m2 | Freq: Once | INTRAVENOUS | Status: AC
  Administered 2018-11-13: 16:00:00 600 mg via INTRAVENOUS
  Filled 2018-11-13: qty 12

## 2018-11-13 MED ORDER — ALTEPLASE 2 MG IJ SOLR
2.0000 mg | Freq: Once | INTRAMUSCULAR | Status: DC | PRN
  Filled 2018-11-13: qty 2

## 2018-11-13 MED ORDER — PALONOSETRON HCL INJECTION 0.25 MG/5ML
0.2500 mg | Freq: Once | INTRAVENOUS | Status: AC
  Administered 2018-11-13: 0.25 mg via INTRAVENOUS

## 2018-11-13 MED ORDER — DEXAMETHASONE SODIUM PHOSPHATE 10 MG/ML IJ SOLN
INTRAMUSCULAR | Status: AC
  Filled 2018-11-13: qty 1

## 2018-11-13 MED ORDER — METHYLPREDNISOLONE SODIUM SUCC 125 MG IJ SOLR
125.0000 mg | Freq: Once | INTRAMUSCULAR | Status: AC
  Administered 2018-11-13: 125 mg via INTRAVENOUS

## 2018-11-13 MED ORDER — PALONOSETRON HCL INJECTION 0.25 MG/5ML
INTRAVENOUS | Status: AC
  Filled 2018-11-13: qty 5

## 2018-11-13 MED ORDER — SODIUM CHLORIDE 0.9 % IV SOLN
1920.0000 mg/m2 | INTRAVENOUS | Status: DC
  Administered 2018-11-13: 3700 mg via INTRAVENOUS
  Filled 2018-11-13: qty 74

## 2018-11-13 MED ORDER — DEXAMETHASONE SODIUM PHOSPHATE 10 MG/ML IJ SOLN
10.0000 mg | Freq: Once | INTRAMUSCULAR | Status: AC
  Administered 2018-11-13: 10 mg via INTRAVENOUS

## 2018-11-13 MED ORDER — SODIUM CHLORIDE 0.9% FLUSH
10.0000 mL | INTRAVENOUS | Status: DC | PRN
  Administered 2018-11-13: 10 mL
  Filled 2018-11-13: qty 10

## 2018-11-13 MED ORDER — SODIUM CHLORIDE 0.9 % IV SOLN
40.0000 mg | Freq: Once | INTRAVENOUS | Status: AC
  Administered 2018-11-13: 40 mg via INTRAVENOUS
  Filled 2018-11-13: qty 4

## 2018-11-13 MED ORDER — HEPARIN SOD (PORK) LOCK FLUSH 100 UNIT/ML IV SOLN
500.0000 [IU] | Freq: Once | INTRAVENOUS | Status: DC | PRN
  Filled 2018-11-13: qty 5

## 2018-11-13 MED ORDER — METHYLPREDNISOLONE SODIUM SUCC 125 MG IJ SOLR
INTRAMUSCULAR | Status: AC
  Filled 2018-11-13: qty 2

## 2018-11-13 MED ORDER — SODIUM CHLORIDE 0.9 % IV SOLN
Freq: Once | INTRAVENOUS | Status: AC
  Administered 2018-11-13: 11:00:00 via INTRAVENOUS
  Filled 2018-11-13: qty 250

## 2018-11-13 MED ORDER — OXALIPLATIN CHEMO INJECTION 100 MG/20ML
68.0000 mg/m2 | Freq: Once | INTRAVENOUS | Status: AC
  Administered 2018-11-13: 130 mg via INTRAVENOUS
  Filled 2018-11-13: qty 10

## 2018-11-13 NOTE — Patient Instructions (Signed)
[place] Discharge Instructions for Patients Receiving Chemotherapy  Today you received the following chemotherapy agents Leucovorin, Oxaliplatin, 5FU.  To help prevent nausea and vomiting after your treatment, we encourage you to take your nausea medication as indicated by your MD>   If you develop nausea and vomiting that is not controlled by your nausea medication, call the clinic.   BELOW ARE SYMPTOMS THAT SHOULD BE REPORTED IMMEDIATELY:  *FEVER GREATER THAN 100.5 F  *CHILLS WITH OR WITHOUT FEVER  NAUSEA AND VOMITING THAT IS NOT CONTROLLED WITH YOUR NAUSEA MEDICATION  *UNUSUAL SHORTNESS OF BREATH  *UNUSUAL BRUISING OR BLEEDING  TENDERNESS IN MOUTH AND THROAT WITH OR WITHOUT PRESENCE OF ULCERS  *URINARY PROBLEMS  *BOWEL PROBLEMS  UNUSUAL RASH Items with * indicate a potential emergency and should be followed up as soon as possible.  Feel free to call the clinic should you have any questions or concerns. The clinic phone number is [phone].  Please show the [place] at check-in to the Emergency Department and triage nurse.

## 2018-11-13 NOTE — Patient Instructions (Signed)

## 2018-11-15 ENCOUNTER — Inpatient Hospital Stay: Payer: Medicare HMO

## 2018-11-15 ENCOUNTER — Other Ambulatory Visit: Payer: Self-pay

## 2018-11-15 ENCOUNTER — Telehealth: Payer: Self-pay | Admitting: *Deleted

## 2018-11-15 ENCOUNTER — Other Ambulatory Visit: Payer: Self-pay | Admitting: *Deleted

## 2018-11-15 VITALS — BP 137/55 | HR 57 | Temp 97.1°F | Resp 18

## 2018-11-15 DIAGNOSIS — C182 Malignant neoplasm of ascending colon: Secondary | ICD-10-CM | POA: Diagnosis not present

## 2018-11-15 DIAGNOSIS — Z8673 Personal history of transient ischemic attack (TIA), and cerebral infarction without residual deficits: Secondary | ICD-10-CM | POA: Diagnosis not present

## 2018-11-15 DIAGNOSIS — D649 Anemia, unspecified: Secondary | ICD-10-CM | POA: Diagnosis not present

## 2018-11-15 DIAGNOSIS — Z5111 Encounter for antineoplastic chemotherapy: Secondary | ICD-10-CM | POA: Diagnosis not present

## 2018-11-15 DIAGNOSIS — E785 Hyperlipidemia, unspecified: Secondary | ICD-10-CM | POA: Diagnosis not present

## 2018-11-15 DIAGNOSIS — D500 Iron deficiency anemia secondary to blood loss (chronic): Secondary | ICD-10-CM

## 2018-11-15 DIAGNOSIS — C189 Malignant neoplasm of colon, unspecified: Secondary | ICD-10-CM

## 2018-11-15 DIAGNOSIS — C787 Secondary malignant neoplasm of liver and intrahepatic bile duct: Secondary | ICD-10-CM | POA: Diagnosis not present

## 2018-11-15 DIAGNOSIS — E119 Type 2 diabetes mellitus without complications: Secondary | ICD-10-CM | POA: Diagnosis not present

## 2018-11-15 DIAGNOSIS — J449 Chronic obstructive pulmonary disease, unspecified: Secondary | ICD-10-CM | POA: Diagnosis not present

## 2018-11-15 DIAGNOSIS — I10 Essential (primary) hypertension: Secondary | ICD-10-CM | POA: Diagnosis not present

## 2018-11-15 DIAGNOSIS — Z87891 Personal history of nicotine dependence: Secondary | ICD-10-CM | POA: Diagnosis not present

## 2018-11-15 LAB — CMP (CANCER CENTER ONLY)
ALT: 19 U/L (ref 0–44)
AST: 23 U/L (ref 15–41)
Albumin: 3.5 g/dL (ref 3.5–5.0)
Alkaline Phosphatase: 82 U/L (ref 38–126)
Anion gap: 5 (ref 5–15)
BUN: 26 mg/dL — ABNORMAL HIGH (ref 8–23)
CO2: 30 mmol/L (ref 22–32)
Calcium: 9 mg/dL (ref 8.9–10.3)
Chloride: 102 mmol/L (ref 98–111)
Creatinine: 0.66 mg/dL (ref 0.44–1.00)
GFR, Est AFR Am: >60 mL/min (ref >60)
GFR, Estimated: >60 mL/min (ref >60)
Glucose, Bld: 372 mg/dL — ABNORMAL HIGH (ref 70–99)
Potassium: 4 mmol/L (ref 3.5–5.1)
Sodium: 137 mmol/L (ref 135–145)
Total Bilirubin: 0.4 mg/dL (ref 0.3–1.2)
Total Protein: 5.9 g/dL — ABNORMAL LOW (ref 6.5–8.1)

## 2018-11-15 MED ORDER — HEPARIN SOD (PORK) LOCK FLUSH 100 UNIT/ML IV SOLN
500.0000 [IU] | Freq: Once | INTRAVENOUS | Status: AC | PRN
  Administered 2018-11-15: 14:00:00 500 [IU]
  Filled 2018-11-15: qty 5

## 2018-11-15 MED ORDER — SODIUM CHLORIDE 0.9% FLUSH
10.0000 mL | INTRAVENOUS | Status: DC | PRN
  Administered 2018-11-15: 10 mL
  Filled 2018-11-15: qty 10

## 2018-11-15 MED ORDER — INSULIN REGULAR HUMAN 100 UNIT/ML IJ SOLN
15.0000 [IU] | Freq: Once | INTRAMUSCULAR | Status: AC
  Administered 2018-11-15: 15:00:00 15 [IU] via SUBCUTANEOUS

## 2018-11-15 NOTE — Patient Instructions (Signed)
Insulin Aspart injection What is this medicine? INSULIN ASPART (IN su lin AS part) is a human-made form of insulin. This drug lowers the amount of sugar in your blood. It is a fast acting insulin that starts working faster than regular insulin. It will not work as long as regular insulin. This medicine may be used for other purposes; ask your health care provider or pharmacist if you have questions. COMMON BRAND NAME(S): Fiasp, [employer], Medtronic, NovoLog, NovoLog Flexpen, NovoLog PenFill What should I tell my health care provider before I take this medicine? They need to know if you have any of these conditions:  episodes of low blood sugar  eye disease, vision problems  kidney disease  liver disease  an unusual or allergic reaction to insulin, metacresol, other medicines, foods, dyes, or preservatives  pregnant or trying to get pregnant  breast-feeding How should I use this medicine? This medicine is for injection under the skin. Use exactly as directed. It is important to follow the directions given to you by your health care professional or doctor. If you are using Novolog, you should start your meal within 5 to 10 minutes after injection. If you are using Fiasp, you should start your meal at the time of injection or within 20 minutes after injection. Have food ready before injection. Do not delay eating. You will be taught how to use this medicine and how to adjust doses for activities and illness. Do not use more insulin than prescribed. Do not use more or less often than prescribed. Always check the appearance of your insulin before using it. This medicine should be clear and colorless like water. Do not use if it is cloudy, thickened, colored, or has solid particles in it. If you use a pen, be sure to take off the outer needle cover before using the dose. It is important that you put your used needles and syringes in a special sharps container. Do not put them in a trash  can. If you do not have a sharps container, call your pharmacist or healthcare provider to get one. Talk to your pediatrician regarding the use of this medicine in children. While this drug may be prescribed for children as young as 2 years for selected conditions, precautions do apply. Overdosage: If you think you have taken too much of this medicine contact a poison control center or emergency room at once. NOTE: This medicine is only for you. Do not share this medicine with others. What if I miss a dose? It is important not to miss a dose. Your health care professional or doctor should discuss a plan for missed doses with you. If you do miss a dose, follow their plan. Do not take double doses. What may interact with this medicine?  other medicines for diabetes Many medications may cause an increase or decrease in blood sugar, these include:  alcohol containing beverages  antiviral medicines for HIV or AIDS  aspirin and aspirin-like drugs  certain medicines for depression, anxiety, or psychotic disturbances  chromium  diuretics  female hormones, like estrogens or progestins and birth control pills  heart medicines  isoniazid  MAOIs like Carbex, Eldepryl, Marplan, Nardil, and Parnate  female hormones or anabolic steroids  medicines for weight loss  medicines for allergies, asthma, cold, or cough  niacin  NSAIDs, medicines for pain and inflammation, like ibuprofen or naproxen  octreotide  pentamidine  phenytoin  probenecid  quinolone antibiotics like ciprofloxacin, levofloxacin, ofloxacin  some herbal dietary supplements  steroid medicines like prednisone or cortisone  sulfamethoxazole; trimethoprim  thyroid medicine Some medications can hide the warning symptoms of low blood sugar. You may need to monitor your blood sugar more closely if you are taking one of these medications. These include:  beta-blockers such as atenolol, metoprolol,  propranolol  clonidine  guanethidine  reserpine This list may not describe all possible interactions. Give your health care provider a list of all the medicines, herbs, non-prescription drugs, or dietary supplements you use. Also tell them if you smoke, drink alcohol, or use illegal drugs. Some items may interact with your medicine. What should I watch for while using this medicine? Visit your health care professional or doctor for regular checks on your progress. A test called the HbA1C (A1C) will be monitored. This is a simple blood test. It measures your blood sugar control over the last 2 to 3 months. You will receive this test every 3 to 6 months. Learn how to check your blood sugar. Learn the symptoms of low and high blood sugar and how to manage them. Always carry a quick-source of sugar with you in case you have symptoms of low blood sugar. Examples include hard sugar candy or glucose tablets. Make sure others know that you can choke if you eat or drink when you develop serious symptoms of low blood sugar, such as seizures or unconsciousness. They must get medical help at once. Tell your doctor or health care professional if you have high blood sugar. You might need to change the dose of your medicine. If you are sick or exercising more than usual, you might need to change the dose of your medicine. Do not skip meals. Ask your doctor or health care professional if you should avoid alcohol. Many nonprescription cough and cold products contain sugar or alcohol. These can affect blood sugar. Make sure that you have the right kind of syringe for the type of insulin you use. Try not to change the brand and type of insulin or syringe unless your health care professional or doctor tells you to. Switching insulin brand or type can cause dangerously high or low blood sugar. Always keep an extra supply of insulin, syringes, and needles on hand. Use a syringe one time only. Throw away syringe and needle  in a closed container to prevent accidental needle sticks. Insulin pens and cartridges should never be shared. Even if the needle is changed, sharing may result in passing of viruses like hepatitis or HIV. Each time you get a new box of pen needles, check to see if they are the same type as the ones you were trained to use. If not, ask your health care professional to show you how to use this new type properly. Wear a medical ID bracelet or chain, and carry a card that describes your disease and details of your medicine and dosage times. What side effects may I notice from receiving this medicine? Side effects that you should report to your doctor or health care professional as soon as possible:  allergic reactions like skin rash, itching or hives, swelling of the face, lips, or tongue  breathing problems  signs and symptoms of high blood sugar such as dizziness, dry mouth, dry skin, fruity breath, nausea, stomach pain, increased hunger or thirst, increased urination  signs and symptoms of low blood sugar such as feeling anxious, confusion, dizziness, increased hunger, unusually weak or tired, sweating, shakiness, cold, irritable, headache, blurred vision, fast heartbeat, loss of consciousness Side effects that  usually do not require medical attention (report to your doctor or health care professional if they continue or are bothersome):  increase or decrease in fatty tissue under the skin due to overuse of a particular injection site  itching, burning, swelling, or rash at site where injected This list may not describe all possible side effects. Call your doctor for medical advice about side effects. You may report side effects to FDA at 1-800-FDA-1088. Where should I keep my medicine? Keep out of the reach of children. Unopened Vials: Novolog Vials: Store in a refrigerator between 2 and 8 degrees C (36 and 46 degrees F) or at room temperature below 30 degrees C (86 degrees F). Do not freeze  or use if the insulin has been frozen. Protect from light and excessive heat. If stored at room temperature, the vial must be discarded after 28 days. Throw away any unopened and unused medicine that has been stored in the refrigerator after the expiration date. Fiasp Vials: Store in a refrigerator between 2 and 8 degrees C (36 and 46 degrees F) or at room temperature below 30 degrees C (86 degrees F). Do not freeze or use if the insulin has been frozen. Protect from light and excessive heat. If stored at room temperature, the vial must be discarded after 28 days. Throw away any unopened and unused medicine that has been stored in the refrigerator after the expiration date. Unopened Pens and Cartridges: Novolog Flexpens and cartridges: Store in a refrigerator between 2 and 8 degrees C (36 and 46 degrees F) or at room temperature below 30 degrees C (86 degrees F). Do not freeze or use if the insulin has been frozen. Protect from light and excessive heat. If stored at room temperature, the pen or cartridge must be discarded after 28 days. Throw away any unopened and unused medicine that has been stored in the refrigerator after the expiration date. Fiasp FlexTouch pens: Store in a refrigerator between 2 and 8 degrees C (36 and 46 degrees F) or at room temperature below 30 degrees C (86 degrees F). Do not freeze or use if the insulin has been frozen. Protect from light and excessive heat. If stored at room temperature, the pen must be discarded after 28 days. Throw away any unopened and unused medicine that has been stored in the refrigerator after the expiration date. Fiasp FlexTouch cartridges: Store at room temperature below 30 degrees C (86 degrees F). Do not refrigerate or freeze. Keep away from heat and light. Throw the cartridge away after 28 days, even if it still has insulin left in it. Vials that you are using: Novolog Vials: Store in the refrigerator or at room temperature below 30 degrees C (86  degrees F). Do not freeze. Keep away from heat and light. Throw the opened vial away after 28 days. Fiasp Vials: Store in the refrigerator or at room temperature below 30 degrees C (86 degrees F). Do not freeze. Keep away from heat and light. Throw the opened vial away after 28 days. Pens and cartridges that you are using: Novolog Flexpens and cartridges: Store at room temperature below 30 degrees C (86 degrees F). Do not refrigerate or freeze. Keep away from heat and light. Throw away the pen or cartridge after 28 days, even if it still has insulin left in it. Fiasp FlexTouch pens: Store in the refrigerator or at room temperature below 30 degrees C (86 degrees F). Do not freeze. Keep away from heat and light. Throw the pen  away after 28 days, even if it still has insulin left in it. Fiasp FlexTouch cartridges: Store at room temperature below 30 degrees C (86 degrees F). Do not refrigerate or freeze. Keep away from heat and light. Throw the cartridge away after 28 days, even if it still has insulin left in it. NOTE: This sheet is a summary. It may not cover all possible information. If you have questions about this medicine, talk to your doctor, pharmacist, or health care provider.  2020 Elsevier/Gold Standard (2018-07-19 11:35:39)

## 2018-11-15 NOTE — Telephone Encounter (Signed)
Call received from patient's daughter [name] stating that patient's BS is 400 and that the patient is extremely anxious regarding elevated result.  She states that she is going to bring patient in now for pump d/c to be assessed.  Dr. Marin Olp notified.  Order received to do a finger stick when patient arrives to office today and that Decadron will be held with next treatment per Dr. Marin Olp.

## 2018-11-18 ENCOUNTER — Telehealth: Payer: Self-pay | Admitting: Endocrinology

## 2018-11-18 NOTE — Telephone Encounter (Signed)
Please move up next appt to this week

## 2018-11-18 NOTE — Telephone Encounter (Signed)
Please advise 

## 2018-11-18 NOTE — Telephone Encounter (Signed)
Daughter's # is [phone]

## 2018-11-18 NOTE — Telephone Encounter (Signed)
Pt daughter calling to let us know her sugars have been very high lately, need an insulin shot on Friday.  She is on chemo therapy now  Daughter is asking if she needs to start taking the [place] in a higher dosing.  Daughter states it has been ranging from 280-480 after chemo.

## 2018-11-19 ENCOUNTER — Other Ambulatory Visit: Payer: Self-pay

## 2018-11-19 NOTE — Telephone Encounter (Signed)
Please refer to Dr. Cordelia Pen request to move appt to this week

## 2018-11-19 NOTE — Telephone Encounter (Signed)
Patient is scheduled for appointment on 11/20/18-patient's daughter [name] will be coming to appointment to help patient

## 2018-11-20 ENCOUNTER — Encounter: Payer: Medicare HMO | Attending: Endocrinology | Admitting: Nutrition

## 2018-11-20 ENCOUNTER — Encounter: Payer: Self-pay | Admitting: Endocrinology

## 2018-11-20 ENCOUNTER — Ambulatory Visit (INDEPENDENT_AMBULATORY_CARE_PROVIDER_SITE_OTHER): Payer: Medicare HMO | Admitting: Endocrinology

## 2018-11-20 VITALS — BP 152/70 | HR 97 | Ht 71.0 in | Wt 164.6 lb

## 2018-11-20 DIAGNOSIS — E118 Type 2 diabetes mellitus with unspecified complications: Secondary | ICD-10-CM

## 2018-11-20 LAB — POCT GLYCOSYLATED HEMOGLOBIN (HGB A1C): Hemoglobin A1C: 8 % — AB (ref 4.0–5.6)

## 2018-11-20 MED ORDER — "PEN NEEDLES 5/16"" 31G X 8 MM MISC": 1.0000 | Freq: Every day | 3 refills | Status: DC

## 2018-11-20 MED ORDER — TRESIBA FLEXTOUCH 100 UNIT/ML ~~LOC~~ SOPN: 15.0000 [IU] | PEN_INJECTOR | Freq: Every day | SUBCUTANEOUS | 11 refills | Status: DC

## 2018-11-20 NOTE — Progress Notes (Signed)
Subjective:    Patient ID: Tonya [name], female    DOB: 1940/05/17, 78 y.o.   MRN: [id number]  HPI Pt returns for f/u of diabetes mellitus: DM type: 2 (but lean body habitus raises possibility she is evolving type 1).  Dx'ed: 6222 Complications: CVA Therapy: Januvia    GDM: never DKA: never Severe hypoglycemia: never.   Pancreatitis: never Pancreatic imaging: normal on 2010 [place].  Other: she did not tolerate metformin-XR (abd pain); she took insulin only for a brief time after diagnosis; edema limits rx options.   Interval history: she brings her meter with her cbg's which I have reviewed today.  She gets chemotx twice more (once each of the next 2 weeks).  She gets decadron for each rx.  cbg was in the 200's prior to first chemotx last week.  It the increased to the 400's.  She is here with dtr Odette Horns), who sees pt each day Past Medical History:  Diagnosis Date  . Cerebrovascular accident ([place])   . Chronic edema    ? venous insufficiency  . COPD (chronic obstructive pulmonary disease) ([place])    never had a problem breathing  . Diabetes mellitus without complication ([place])   . Enlarged heart    per pt  . Gallstones   . GERD (gastroesophageal reflux disease)   . Goals of care, counseling/discussion 11/01/2018  . Hyperlipidemia   . Hypertension     Past Surgical History:  Procedure Laterality Date  . ABDOMINAL HYSTERECTOMY    . BIOPSY  10/18/2018   Procedure: BIOPSY;  Surgeon: Jackquline Denmark, MD;  Location: [facility] ENDOSCOPY;  Service: Endoscopy;;  . CHOLECYSTECTOMY    . COLONOSCOPY WITH PROPOFOL N/A 10/18/2018   Procedure: COLONOSCOPY WITH PROPOFOL;  Surgeon: Jackquline Denmark, MD;  Location: [facility] ENDOSCOPY;  Service: Endoscopy;  Laterality: N/A;  . IR IMAGING GUIDED PORT INSERTION  11/11/2018  . POLYPECTOMY  10/18/2018   Procedure: POLYPECTOMY;  Surgeon: Jackquline Denmark, MD;  Location: [facility] ENDOSCOPY;  Service: Endoscopy;;  . SUBMUCOSAL TATTOO INJECTION  10/18/2018   Procedure:  SUBMUCOSAL TATTOO INJECTION;  Surgeon: Jackquline Denmark, MD;  Location: [facility] ENDOSCOPY;  Service: Endoscopy;;    Social History   Socioeconomic History  . Marital status: Widowed    Spouse name: Not on file  . Number of children: 5  . Years of education: Not on file  . Highest education level: Not on file  Occupational History  . Not on file  Social Needs  . Financial resource strain: Not on file  . Food insecurity    Worry: Not on file    Inability: Not on file  . Transportation needs    Medical: Not on file    Non-medical: Not on file  Tobacco Use  . Smoking status: Former Smoker    Packs/day: 0.25    Years: 1.00    Pack years: 0.25    Quit date: 07/23/1968    Years since quitting: 50.3  . Smokeless tobacco: Never Used  Substance and Sexual Activity  . Alcohol use: No    Alcohol/week: 0.0 standard drinks  . Drug use: No  . Sexual activity: Not Currently  Lifestyle  . Physical activity    Days per week: Not on file    Minutes per session: Not on file  . Stress: Not on file  Relationships  . Social Herbalist on phone: Not on file    Gets together: Not on file    Attends religious service:  Not on file    Active member of club or organization: Not on file    Attends meetings of clubs or organizations: Not on file    Relationship status: Not on file  . Intimate partner violence    Fear of current or ex partner: Not on file    Emotionally abused: Not on file    Physically abused: Not on file    Forced sexual activity: Not on file  Other Topics Concern  . Not on file  Social History Narrative  . Not on file    Current Outpatient Medications on File Prior to Visit  Medication Sig Dispense Refill  . amLODipine (NORVASC) 10 MG tablet Take 1 tablet (10 mg total) by mouth daily. 30 tablet 0  . amLODipine (NORVASC) 5 MG tablet     . aspirin EC 81 MG tablet Take 81 mg by mouth daily.    Marland Kitchen atorvastatin (LIPITOR) 10 MG tablet TAKE 1 TABLET(10 MG) BY MOUTH DAILY  (Patient taking differently: Take 10 mg by mouth daily. ) 90 tablet 1  . Blood Glucose Monitoring Suppl (ONETOUCH VERIO) w/Device KIT Prick finger 2 times a day to test blood glucose level 1 kit 0  . Blood Pressure Monitoring (SPHYGMOMANOMETER) MISC 1 Units by Does not apply route every morning. 1 each 0  . Cholecalciferol 2000 units TABS Take 1 tablet (2,000 Units total) by mouth daily. 90 tablet 1  . dexamethasone (DECADRON) 4 MG tablet Take 2 tablets (8 mg total) by mouth daily. Start the day after chemotherapy for 2 days. Take with food. 30 tablet 1  . hydrochlorothiazide (HYDRODIURIL) 25 MG tablet Take 25 mg by mouth daily.    . Lancets (ONETOUCH ULTRASOFT) lancets Ultra soft smooth 33 gauge. Prick finger 2 times a day to test blood glucose level (Patient taking differently: 1 each by Other route 2 (two) times a day. One Touch Verio smooth 33 gauge) 100 each 12  . lidocaine-prilocaine (EMLA) cream Apply to Portacath 1-2 hours prior to chemotherapy or blood draw. 30 g 3  . metoprolol tartrate (LOPRESSOR) 25 MG tablet Take 0.5 tablets (12.5 mg total) by mouth 2 (two) times daily. 60 tablet 0  . ondansetron (ZOFRAN) 8 MG tablet Take 1 tablet (8 mg total) by mouth 2 (two) times daily as needed for refractory nausea / vomiting. Start on day 3 after chemotherapy. 30 tablet 1  . ONETOUCH VERIO test strip 1 each by Other route 3 (three) times daily. USE TO TEST BLOOD SUGAR UP TO QID 100 each 0  . prochlorperazine (COMPAZINE) 10 MG tablet Take 1 tablet (10 mg total) by mouth every 6 (six) hours as needed (Nausea or vomiting). 30 tablet 1  . sitaGLIPtin (JANUVIA) 50 MG tablet Take 1 tablet (50 mg total) by mouth daily. (Patient taking differently: Take 25 mg by mouth 2 (two) times a day. ) 90 tablet 3   No current facility-administered medications on file prior to visit.     Allergies  Allergen Reactions  . Iohexol Hives, Shortness Of Breath and Other (See Comments)    Patient developed hives and  fullness in throat post injection of 125cc's Omni 300, Onset Date: 11/15/2006   . Hydrocodone Swelling and Other (See Comments)    "I started swelling, became red, and passed out"  . Ibuprofen Nausea Only and Other (See Comments)    "It made my stomach hurt"  . Naproxen Other (See Comments)    "It made me feel out of my head"  .  Propoxyphene N-Acetaminophen Other (See Comments)    "I couldn't find the door to make my way out of the room- I was in misery"    Family History  Problem Relation Age of Onset  . Cirrhosis Mother   . Heart disease Father   . Cancer Other   . Hypertension Other   . Diabetes Other     BP (!) 152/70 (BP Location: Right Arm, Patient Position: Sitting, Cuff Size: Normal)   Pulse 97   Ht '5\' 11"'  (1.803 m)   Wt 164 lb 9.6 oz (74.7 kg)   SpO2 90%   BMI 22.96 kg/m   Review of Systems She denies hypoglycemia    Objective:   Physical Exam VITAL SIGNS:  See vs page GENERAL: no distress.  In wheelchair Pulses: dorsalis pedis intact bilat.   MSK: no deformity of the feet CV: 1+ bilat leg edema Skin:  no ulcer on the feet.  normal color and temp on the feet.  Neuro: sensation is intact to touch on the feet.  Ext: there is bilateral onychomycosis of the toenails.    Lab Results  Component Value Date   HGBA1C 8.0 (A) 11/20/2018    Lab Results  Component Value Date   CREATININE 0.66 11/15/2018   BUN 26 (H) 11/15/2018   NA 137 11/15/2018   K 4.0 11/15/2018   CL 102 11/15/2018   CO2 30 11/15/2018      Assessment & Plan:  HTN: is noted today Type 2 DM, with CVA: worse Domestic situation (pt is alone most of the day): she is not a candidate for multiple daily injections. Colon cancer: she may be able to d/c insulin when chemotx is done.  Patient Instructions  Your blood pressure is high today.  Please see your primary care provider soon, to have it rechecked.   I have sent a prescription to your pharmacy, to start insulin.  Please see Vaughan Basta, to  learn about the insulin.  Please call or message [place] next week, to tell us how the blood sugar is doing. Please come back for a follow-up appointment in 3 weeks.  Please continue the same jnuvia check your blood sugar twice a day.  vary the time of day when you check, between before the 3 meals, and at bedtime.  also check if you have symptoms of your blood sugar being too high or too low.  please keep a record of the readings and bring it to your next appointment here (or you can bring the meter itself).  You can write it on any piece of paper.  please call us sooner if your blood sugar goes below 70, or if you have a lot of readings over 200.

## 2018-11-20 NOTE — Patient Instructions (Addendum)
Your blood pressure is high today.  Please see your primary care provider soon, to have it rechecked.   I have sent a prescription to your pharmacy, to start insulin.  Please see Vaughan Basta, to learn about the insulin.  Please call or message [place] next week, to tell us how the blood sugar is doing. Please come back for a follow-up appointment in 3 weeks.  Please continue the same jnuvia check your blood sugar twice a day.  vary the time of day when you check, between before the 3 meals, and at bedtime.  also check if you have symptoms of your blood sugar being too high or too low.  please keep a record of the readings and bring it to your next appointment here (or you can bring the meter itself).  You can write it on any piece of paper.  please call us sooner if your blood sugar goes below 70, or if you have a lot of readings over 200.

## 2018-11-23 NOTE — Patient Instructions (Signed)
Take insulin as directed. Call the office if blood sugars remain over 250, or drop below 80.

## 2018-11-23 NOTE — Progress Notes (Signed)
Patient is here with her daughter.  She going through chemotherapy, with high doses of cortisone, putting her blood sugar readings out of control.  Mother wants daughter to learn how to give this insulin.  I insisted that she also learn, and she was given reasons for needing, using this insulin.  We discussed the timing of this insulin, how to take it using a pen, and how to store this insulin.  They both reported good understanding of this. We also discussed how/when to test blood sugar readings, and when to call the office if readings drop low, or remain high.   We also discussed low blood sugars--symptoms and treatments.  They both reported good understanding of this.  Daughter reports that mother is not eating much and requested some liquid supplements.  Suggested Boost and Glucerna, and discussed the need for nutrition at this time.  They had no final questions.

## 2018-11-25 ENCOUNTER — Inpatient Hospital Stay (HOSPITAL_BASED_OUTPATIENT_CLINIC_OR_DEPARTMENT_OTHER): Payer: Medicare HMO | Admitting: Hematology & Oncology

## 2018-11-25 ENCOUNTER — Other Ambulatory Visit: Payer: Self-pay

## 2018-11-25 ENCOUNTER — Inpatient Hospital Stay: Payer: Medicare HMO | Attending: Hematology & Oncology

## 2018-11-25 ENCOUNTER — Inpatient Hospital Stay: Payer: Medicare HMO

## 2018-11-25 ENCOUNTER — Encounter: Payer: Self-pay | Admitting: Hematology & Oncology

## 2018-11-25 VITALS — BP 134/76 | HR 80 | Temp 97.1°F | Resp 16 | Wt 165.0 lb

## 2018-11-25 DIAGNOSIS — Z7984 Long term (current) use of oral hypoglycemic drugs: Secondary | ICD-10-CM | POA: Diagnosis not present

## 2018-11-25 DIAGNOSIS — C189 Malignant neoplasm of colon, unspecified: Secondary | ICD-10-CM

## 2018-11-25 DIAGNOSIS — Z7982 Long term (current) use of aspirin: Secondary | ICD-10-CM | POA: Diagnosis not present

## 2018-11-25 DIAGNOSIS — Z79899 Other long term (current) drug therapy: Secondary | ICD-10-CM | POA: Diagnosis not present

## 2018-11-25 DIAGNOSIS — C787 Secondary malignant neoplasm of liver and intrahepatic bile duct: Secondary | ICD-10-CM | POA: Diagnosis not present

## 2018-11-25 DIAGNOSIS — Z794 Long term (current) use of insulin: Secondary | ICD-10-CM | POA: Insufficient documentation

## 2018-11-25 DIAGNOSIS — Z5111 Encounter for antineoplastic chemotherapy: Secondary | ICD-10-CM | POA: Diagnosis not present

## 2018-11-25 DIAGNOSIS — C182 Malignant neoplasm of ascending colon: Secondary | ICD-10-CM | POA: Diagnosis present

## 2018-11-25 DIAGNOSIS — E119 Type 2 diabetes mellitus without complications: Secondary | ICD-10-CM | POA: Insufficient documentation

## 2018-11-25 DIAGNOSIS — R011 Cardiac murmur, unspecified: Secondary | ICD-10-CM

## 2018-11-25 DIAGNOSIS — D500 Iron deficiency anemia secondary to blood loss (chronic): Secondary | ICD-10-CM

## 2018-11-25 DIAGNOSIS — D509 Iron deficiency anemia, unspecified: Secondary | ICD-10-CM | POA: Insufficient documentation

## 2018-11-25 LAB — CBC WITH DIFFERENTIAL (CANCER CENTER ONLY)
Abs Immature Granulocytes: 0.01 10*3/uL (ref 0.00–0.07)
Basophils Absolute: 0 10*3/uL (ref 0.0–0.1)
Basophils Relative: 0 %
Eosinophils Absolute: 0.1 10*3/uL (ref 0.0–0.5)
Eosinophils Relative: 1 %
HCT: 33.7 % — ABNORMAL LOW (ref 36.0–46.0)
Hemoglobin: 9.9 g/dL — ABNORMAL LOW (ref 12.0–15.0)
Immature Granulocytes: 0 %
Lymphocytes Relative: 27 %
Lymphs Abs: 1.4 10*3/uL (ref 0.7–4.0)
MCH: 22.3 pg — ABNORMAL LOW (ref 26.0–34.0)
MCHC: 29.4 g/dL — ABNORMAL LOW (ref 30.0–36.0)
MCV: 76.1 fL — ABNORMAL LOW (ref 80.0–100.0)
Monocytes Absolute: 0.5 10*3/uL (ref 0.1–1.0)
Monocytes Relative: 10 %
Neutro Abs: 3.3 10*3/uL (ref 1.7–7.7)
Neutrophils Relative %: 62 %
Platelet Count: 232 10*3/uL (ref 150–400)
RBC: 4.43 MIL/uL (ref 3.87–5.11)
RDW: 30 % — ABNORMAL HIGH (ref 11.5–15.5)
WBC Count: 5.3 10*3/uL (ref 4.0–10.5)
nRBC: 0 % (ref 0.0–0.2)

## 2018-11-25 LAB — CMP (CANCER CENTER ONLY)
ALT: 16 U/L (ref 0–44)
AST: 17 U/L (ref 15–41)
Albumin: 3.4 g/dL — ABNORMAL LOW (ref 3.5–5.0)
Alkaline Phosphatase: 92 U/L (ref 38–126)
Anion gap: 7 (ref 5–15)
BUN: 9 mg/dL (ref 8–23)
CO2: 29 mmol/L (ref 22–32)
Calcium: 9.1 mg/dL (ref 8.9–10.3)
Chloride: 102 mmol/L (ref 98–111)
Creatinine: 0.55 mg/dL (ref 0.44–1.00)
GFR, Est AFR Am: >60 mL/min (ref >60)
GFR, Estimated: >60 mL/min (ref >60)
Glucose, Bld: 176 mg/dL — ABNORMAL HIGH (ref 70–99)
Potassium: 3.8 mmol/L (ref 3.5–5.1)
Sodium: 138 mmol/L (ref 135–145)
Total Bilirubin: 0.4 mg/dL (ref 0.3–1.2)
Total Protein: 5.9 g/dL — ABNORMAL LOW (ref 6.5–8.1)

## 2018-11-25 MED ORDER — DEXTROSE 5 % IV SOLN
Freq: Once | INTRAVENOUS | Status: AC
  Administered 2018-11-25: 13:00:00 via INTRAVENOUS
  Filled 2018-11-25: qty 250

## 2018-11-25 MED ORDER — SODIUM CHLORIDE 0.9 % IV SOLN
Freq: Once | INTRAVENOUS | Status: AC
  Administered 2018-11-25: 12:00:00 via INTRAVENOUS
  Filled 2018-11-25: qty 250

## 2018-11-25 MED ORDER — LEUCOVORIN CALCIUM INJECTION 350 MG
400.0000 mg/m2 | Freq: Once | INTRAVENOUS | Status: AC
  Administered 2018-11-25: 776 mg via INTRAVENOUS
  Filled 2018-11-25: qty 38.8

## 2018-11-25 MED ORDER — FLUOROURACIL CHEMO INJECTION 2.5 GM/50ML
320.0000 mg/m2 | Freq: Once | INTRAVENOUS | Status: AC
  Administered 2018-11-25: 600 mg via INTRAVENOUS
  Filled 2018-11-25: qty 12

## 2018-11-25 MED ORDER — PALONOSETRON HCL INJECTION 0.25 MG/5ML
0.2500 mg | Freq: Once | INTRAVENOUS | Status: AC
  Administered 2018-11-25: 0.25 mg via INTRAVENOUS

## 2018-11-25 MED ORDER — SODIUM CHLORIDE 0.9 % IV SOLN
510.0000 mg | Freq: Once | INTRAVENOUS | Status: AC
  Administered 2018-11-25: 510 mg via INTRAVENOUS
  Filled 2018-11-25: qty 510

## 2018-11-25 MED ORDER — SODIUM CHLORIDE 0.9 % IV SOLN
Freq: Once | INTRAVENOUS | Status: AC
  Administered 2018-11-25: 13:00:00 via INTRAVENOUS
  Filled 2018-11-25: qty 5

## 2018-11-25 MED ORDER — OXALIPLATIN CHEMO INJECTION 100 MG/20ML
68.0000 mg/m2 | Freq: Once | INTRAVENOUS | Status: AC
  Administered 2018-11-25: 130 mg via INTRAVENOUS
  Filled 2018-11-25: qty 20

## 2018-11-25 MED ORDER — SODIUM CHLORIDE 0.9 % IV SOLN
1920.0000 mg/m2 | INTRAVENOUS | Status: DC
  Administered 2018-11-25: 3700 mg via INTRAVENOUS
  Filled 2018-11-25: qty 74

## 2018-11-25 MED ORDER — SODIUM CHLORIDE 0.9% FLUSH
10.0000 mL | INTRAVENOUS | Status: DC | PRN
  Filled 2018-11-25: qty 10

## 2018-11-25 MED ORDER — HEPARIN SOD (PORK) LOCK FLUSH 100 UNIT/ML IV SOLN
500.0000 [IU] | Freq: Once | INTRAVENOUS | Status: DC | PRN
  Filled 2018-11-25: qty 5

## 2018-11-25 NOTE — Patient Instructions (Signed)
Tunneled Central Venous Catheter Flushing Guide  It is important to flush your tunneled central venous catheter each time you use it, both before and after you use it. Flushing your catheter will help prevent it from clogging. What are the risks? Risks may include:  Infection.  Air getting into the catheter and bloodstream. Supplies needed:  A clean pair of gloves.  A disinfecting wipe. Use an alcohol wipe, chlorhexidine wipe, or iodine wipe as told by your health care provider.  A 10 mL syringe that has been prefilled with saline solution.  An empty 10 mL syringe, if a substance called heparin was injected into your catheter. How to flush your catheter When you flush your catheter, make sure you follow any specific instructions from your health care provider or the manufacturer. These are general guidelines. Flushing your catheter before use If there is heparin in your catheter: 1. Wash your hands with soap and water. 2. Put on gloves. 3. Scrub the injection cap for a minimum of 15 seconds with a disinfecting wipe. 4. Unclamp the catheter. 5. Attach the empty syringe to the injection cap. 6. Pull the syringe plunger back and withdraw 10 mL of blood. 7. Place the syringe into an appropriate waste container. 8. Scrub the injection cap for 15 seconds with a disinfecting wipe. 9. Attach the prefilled syringe to the injection cap. 10. Flush the catheter by pushing the plunger forward until all the liquid from the syringe is in the catheter. 11. Remove the syringe from the injection cap. 12. Clamp the catheter. If there is no heparin in your catheter: 1. Wash your hands with soap and water. 2. Put on gloves. 3. Scrub the injection cap for 15 seconds with a disinfecting wipe. 4. Unclamp the catheter. 5. Attach the prefilled syringe to the injection cap. 6. Flush the catheter by pushing the plunger forward until 5 mL of the liquid from the syringe is in the catheter. 7. Pull back on  the syringe until you see blood in the catheter. 8. If you have been asked to collect any blood, follow your health care provider's instructions. Otherwise, flush the catheter with the rest of the solution from the syringe. 9. Remove the syringe from the injection cap. 10. Clamp the catheter.  Flushing your catheter after use 1. Wash your hands with soap and water. 2. Put on gloves. 3. Scrub the injection cap for 15 seconds with a disinfecting wipe. 4. Unclamp the catheter. 5. Attach the prefilled syringe to the injection cap. 6. Flush the catheter by pushing the plunger forward until all of the liquid from the syringe is in the catheter. 7. Remove the syringe from the injection cap. 8. Clamp the catheter. Problems and solutions  If blood cannot be completely cleared from the injection cap, you may need to have the injection cap replaced.  If the catheter is difficult to flush, use the pulsing method. The pulsing method involves pushing only a few milliliters of solution into the catheter at a time and pausing between pushes.  If you do not see blood in the catheter when you pull back on the syringe, change your body position, such as by raising your arms above your head. Take a deep breath and cough. Then, pull back on the syringe. If you still do not see blood, flush the catheter with a small amount of solution. Then, change positions again and take a breath or cough. Pull back on the syringe again. If you still do not see   blood, finish flushing the catheter and contact your health care provider. Do not use your catheter until your health care provider says it is okay. General tips  Have someone help you flush your catheter, if possible.  Do not force fluid through your catheter.  Do not use a syringe that is larger or smaller than 10 mL. Using a smaller syringe can make the catheter burst.  Do not use your catheter without flushing it first if it has heparin in it. Contact a health  care provider if:  You cannot see any blood in the catheter when you flush it before using it.  Your catheter is difficult to flush. Get help right away if:  You cannot flush the catheter.  The catheter leaks when you flush it or when there is fluid in it.  There are cracks or breaks in the catheter. Summary  It is important to flush your tunneled central venous catheter each time you use it, both before and after you use it.  Scrub the injection cap for 15 seconds with a disinfecting wipe before and after you flush it.  When you flush your catheter, make sure you follow any specific instructions from your health care provider or the manufacturer.  Get help right away if you cannot flush the catheter. This information is not intended to replace advice given to you by your health care provider. Make sure you discuss any questions you have with your health care provider. Document Released: 03/30/2011 Document Revised: 06/26/2018 Document Reviewed: 06/26/2018 Elsevier Patient Education  2020 [employer]  

## 2018-11-25 NOTE — Addendum Note (Signed)
Addended by: Burney Gauze R on: 11/25/2018 12:27 PM   Modules accepted: Orders

## 2018-11-25 NOTE — Progress Notes (Signed)
Hematology and Oncology Follow Up Visit  QUINTERIA CHISUM [id number] 1940-08-02 78 y.o. 11/25/2018   Principle Diagnosis:   Stage IV adenocarcinoma of the ascending colon -- NRAS+, BRAF-, HER2-, MSI/MMR -,   Iron deficiency anemia  Current Therapy:    FOLFOX -- s/p cycle #1  IV Iron -  Feraheme  -- given on 11/11/2018     Interim History:  Ms. Borromeo is back for follow-up.  She is doing okay.  She had problems with hyperglycemia with the first cycle of chemotherapy.  I think this probably was from the Decadron.  I stop the Decadron and talk to our pharmacist.  Our pharmacist, who is outstanding, will put in Aloxi and Emend for her nausea medications.  She is now on insulin for the blood sugars.  Her family doctor is doing a great job with this.  She has had no pain.  Appetite is doing okay.  She does have iron deficiency.  Her ferritin was 8 with an iron saturation of 11% back in a couple weeks ago.  She has already had 1 dose of iron.  We will give her another dose of iron today.  She has had no problems with bleeding.  There is no diarrhea.  She has had no issues with cough or shortness of breath.  There is no leg swelling.  She has had no rashes.  Overall, her performance status is ECOG 1.  Medications:  Current Outpatient Medications:  .  amLODipine (NORVASC) 10 MG tablet, Take 1 tablet (10 mg total) by mouth daily., Disp: 30 tablet, Rfl: 0 .  amLODipine (NORVASC) 5 MG tablet, , Disp: , Rfl:  .  aspirin EC 81 MG tablet, Take 81 mg by mouth daily., Disp: , Rfl:  .  atorvastatin (LIPITOR) 10 MG tablet, TAKE 1 TABLET(10 MG) BY MOUTH DAILY (Patient taking differently: Take 10 mg by mouth daily. ), Disp: 90 tablet, Rfl: 1 .  Blood Glucose Monitoring Suppl (ONETOUCH VERIO) w/Device KIT, Prick finger 2 times a day to test blood glucose level, Disp: 1 kit, Rfl: 0 .  Blood Pressure Monitoring (SPHYGMOMANOMETER) MISC, 1 Units by Does not apply route every morning., Disp: 1 each, Rfl: 0 .   Cholecalciferol 2000 units TABS, Take 1 tablet (2,000 Units total) by mouth daily., Disp: 90 tablet, Rfl: 1 .  dexamethasone (DECADRON) 4 MG tablet, Take 2 tablets (8 mg total) by mouth daily. Start the day after chemotherapy for 2 days. Take with food., Disp: 30 tablet, Rfl: 1 .  hydrochlorothiazide (HYDRODIURIL) 25 MG tablet, Take 25 mg by mouth daily., Disp: , Rfl:  .  insulin degludec (TRESIBA FLEXTOUCH) 100 UNIT/ML SOPN FlexTouch Pen, Inject 0.15 mLs (15 Units total) into the skin daily., Disp: 5 pen, Rfl: 11 .  Insulin Pen Needle (PEN NEEDLES 31GX5/16") 31G X 8 MM MISC, 1 Device by Does not apply route daily., Disp: 100 each, Rfl: 3 .  Lancets (ONETOUCH ULTRASOFT) lancets, Ultra soft smooth 33 gauge. Prick finger 2 times a day to test blood glucose level (Patient taking differently: 1 each by Other route 2 (two) times a day. One Touch Verio smooth 33 gauge), Disp: 100 each, Rfl: 12 .  lidocaine-prilocaine (EMLA) cream, Apply to Portacath 1-2 hours prior to chemotherapy or blood draw., Disp: 30 g, Rfl: 3 .  metoprolol tartrate (LOPRESSOR) 25 MG tablet, Take 0.5 tablets (12.5 mg total) by mouth 2 (two) times daily., Disp: 60 tablet, Rfl: 0 .  ondansetron (ZOFRAN) 8 MG tablet, Take  1 tablet (8 mg total) by mouth 2 (two) times daily as needed for refractory nausea / vomiting. Start on day 3 after chemotherapy., Disp: 30 tablet, Rfl: 1 .  ONETOUCH VERIO test strip, 1 each by Other route 3 (three) times daily. USE TO TEST BLOOD SUGAR UP TO QID, Disp: 100 each, Rfl: 0 .  prochlorperazine (COMPAZINE) 10 MG tablet, Take 1 tablet (10 mg total) by mouth every 6 (six) hours as needed (Nausea or vomiting)., Disp: 30 tablet, Rfl: 1 .  sitaGLIPtin (JANUVIA) 50 MG tablet, Take 1 tablet (50 mg total) by mouth daily. (Patient taking differently: Take 25 mg by mouth 2 (two) times a day. ), Disp: 90 tablet, Rfl: 3 No current facility-administered medications for this visit.   Facility-Administered Medications  Ordered in Other Visits:  .  ferumoxytol (FERAHEME) 510 mg in sodium chloride 0.9 % 100 mL IVPB, 510 mg, Intravenous, Once, , Rudell Cobb, MD  Allergies:  Allergies  Allergen Reactions  . Iohexol Hives, Shortness Of Breath and Other (See Comments)    Patient developed hives and fullness in throat post injection of 125cc's Omni 300, Onset Date: 11/15/2006   . Hydrocodone Swelling and Other (See Comments)    "I started swelling, became red, and passed out"  . Ibuprofen Nausea Only and Other (See Comments)    "It made my stomach hurt"  . Naproxen Other (See Comments)    "It made me feel out of my head"  . Propoxyphene N-Acetaminophen Other (See Comments)    "I couldn't find the door to make my way out of the room- I was in misery"    Past Medical History, Surgical history, Social history, and Family History were reviewed and updated.  Review of Systems: Review of Systems  Constitutional: Negative.   HENT:  Negative.   Eyes: Negative.   Respiratory: Negative.   Cardiovascular: Negative.   Gastrointestinal: Negative.   Endocrine: Negative.   Genitourinary: Negative.    Musculoskeletal: Negative.   Skin: Negative.   Neurological: Negative.   Hematological: Negative.   Psychiatric/Behavioral: Negative.     Physical Exam:  weight is 165 lb (74.8 kg). Her temporal temperature is 97.1 F (36.2 C) (abnormal). Her blood pressure is 134/76 and her pulse is 80. Her respiration is 16 and oxygen saturation is 100%.   Wt Readings from Last 3 Encounters:  11/25/18 165 lb (74.8 kg)  11/20/18 164 lb 9.6 oz (74.7 kg)  11/11/18 160 lb (72.6 kg)    Physical Exam Vitals signs reviewed.  HENT:     Head: Normocephalic and atraumatic.  Eyes:     Pupils: Pupils are equal, round, and reactive to light.  Neck:     Musculoskeletal: Normal range of motion.  Cardiovascular:     Rate and Rhythm: Normal rate and regular rhythm.     Heart sounds: Normal heart sounds.  Pulmonary:      Effort: Pulmonary effort is normal.     Breath sounds: Normal breath sounds.  Abdominal:     General: Bowel sounds are normal.     Palpations: Abdomen is soft.     Comments: Abdominal exam is soft.  There is no palpable hepatosplenomegaly.  She has no fluid wave.  There is no guarding or rebound tenderness.  Musculoskeletal: Normal range of motion.        General: No tenderness or deformity.  Lymphadenopathy:     Cervical: No cervical adenopathy.  Skin:    General: Skin is warm and dry.  Findings: No erythema or rash.  Neurological:     Mental Status: She is alert and oriented to person, place, and time.     Comments: She has weakness on her right side.  This for the past stroke.  Otherwise, she has good strength on her left side.  Psychiatric:        Behavior: Behavior normal.        Thought Content: Thought content normal.        Judgment: Judgment normal.      Lab Results  Component Value Date   WBC 5.3 11/25/2018   HGB 9.9 (L) 11/25/2018   HCT 33.7 (L) 11/25/2018   MCV 76.1 (L) 11/25/2018   PLT 232 11/25/2018     Chemistry      Component Value Date/Time   NA 138 11/25/2018 1110   NA 139 09/30/2018 1311   K 3.8 11/25/2018 1110   CL 102 11/25/2018 1110   CO2 29 11/25/2018 1110   BUN 9 11/25/2018 1110   BUN 10 09/30/2018 1311   CREATININE 0.55 11/25/2018 1110      Component Value Date/Time   CALCIUM 9.1 11/25/2018 1110   ALKPHOS 92 11/25/2018 1110   AST 17 11/25/2018 1110   ALT 16 11/25/2018 1110   BILITOT 0.4 11/25/2018 1110       Impression and Plan: Ms. Zachow is an 78 year old African-American female.  She has metastatic colon cancer.  She has low I will consider oligometastatic disease.  Her CEA level which saw her was only 4.  Certainly cannot use is to monitor for disease response.  We will go ahead with her second cycle of chemotherapy.  After her fourth cycle I will repeat her scans.  If we have a good response in the liver, then I would then  consider some form of intrahepatic therapy.  I does want her quality of life to be good.  Hopefully that we will give her today we will do okay.  I will see her back in 2 more weeks.   [name], MD 8/3/202012:05 PM

## 2018-11-25 NOTE — Patient Instructions (Signed)
[facility] Discharge Instructions for Patients Receiving Chemotherapy  Today you received the following chemotherapy agents Oxaliplatin, 5FU, Leucovorin  To help prevent nausea and vomiting after your treatment, we encourage you to take your nausea medication    If you develop nausea and vomiting that is not controlled by your nausea medication, call the clinic.   BELOW ARE SYMPTOMS THAT SHOULD BE REPORTED IMMEDIATELY:  *FEVER GREATER THAN 100.5 F  *CHILLS WITH OR WITHOUT FEVER  NAUSEA AND VOMITING THAT IS NOT CONTROLLED WITH YOUR NAUSEA MEDICATION  *UNUSUAL SHORTNESS OF BREATH  *UNUSUAL BRUISING OR BLEEDING  TENDERNESS IN MOUTH AND THROAT WITH OR WITHOUT PRESENCE OF ULCERS  *URINARY PROBLEMS  *BOWEL PROBLEMS  UNUSUAL RASH Items with * indicate a potential emergency and should be followed up as soon as possible.  Feel free to call the clinic should you have any questions or concerns. The clinic phone number is [phone].  Please show the CHEMO ALERT CARD at check-in to the Emergency Department and triage nurse.   

## 2018-11-26 ENCOUNTER — Telehealth: Payer: Self-pay | Admitting: Endocrinology

## 2018-11-26 LAB — CEA (IN HOUSE-CHCC): CEA (CHCC-In House): 7.19 ng/mL — ABNORMAL HIGH (ref 0.00–5.00)

## 2018-11-26 LAB — IRON AND TIBC
Iron: 30 ug/dL — ABNORMAL LOW (ref 41–142)
Saturation Ratios: 11 % — ABNORMAL LOW (ref 21–57)
TIBC: 274 ug/dL (ref 236–444)
UIBC: 244 ug/dL (ref 120–384)

## 2018-11-26 LAB — FERRITIN: Ferritin: 510 ng/mL — ABNORMAL HIGH (ref 11–307)

## 2018-11-26 NOTE — Telephone Encounter (Signed)
Paients daughter [name] called about Tonya [name] usage and general information so that she is aware of how to assist patient with medicine.    Please advise at [phone]

## 2018-11-27 ENCOUNTER — Inpatient Hospital Stay: Payer: Medicare HMO

## 2018-11-27 VITALS — BP 139/87 | HR 68 | Temp 97.1°F | Resp 17

## 2018-11-27 DIAGNOSIS — Z5111 Encounter for antineoplastic chemotherapy: Secondary | ICD-10-CM | POA: Diagnosis not present

## 2018-11-27 DIAGNOSIS — C189 Malignant neoplasm of colon, unspecified: Secondary | ICD-10-CM

## 2018-11-27 MED ORDER — SODIUM CHLORIDE 0.9% FLUSH
10.0000 mL | INTRAVENOUS | Status: DC | PRN
  Administered 2018-11-27: 10 mL
  Filled 2018-11-27: qty 10

## 2018-11-27 MED ORDER — HEPARIN SOD (PORK) LOCK FLUSH 100 UNIT/ML IV SOLN
500.0000 [IU] | Freq: Once | INTRAVENOUS | Status: AC | PRN
  Administered 2018-11-27: 500 [IU]
  Filled 2018-11-27: qty 5

## 2018-11-27 NOTE — Telephone Encounter (Signed)
Please call pt dtr to assist

## 2018-11-27 NOTE — Telephone Encounter (Signed)
Called pt's daughter and she just had questions regarding proper administration of the pt's insulin since she will now be helping the pt with medication administration. All daughters questions and concerns were addressed and she was encouraged to call back any time if any questions should arise. Pt's daughter verbalized understanding.

## 2018-11-29 ENCOUNTER — Telehealth: Payer: Self-pay | Admitting: *Deleted

## 2018-11-29 ENCOUNTER — Telehealth: Payer: Self-pay | Admitting: Endocrinology

## 2018-11-29 NOTE — Telephone Encounter (Signed)
Called pt dtr and informed of Dr. Quin Hoop advice. Verbalized acceptance and understanding.

## 2018-11-29 NOTE — Telephone Encounter (Signed)
Please advise 

## 2018-11-29 NOTE — Telephone Encounter (Signed)
Patients daughter called and is wanting to know if it is ok if she gives her mom her insulin in the morning and not the afternoon. The afternoon is not a good time for her.  Also, states her sugars do not go below 200.  Please Advise, Thanks

## 2018-11-29 NOTE — Telephone Encounter (Signed)
Call received from patient's daughter, [name] stating that pt has had difficulty sleeping the weeks of her chemo treatments. Instructed [name] that sleeplessness is a side effect of the Decadron patient is taking for nausea and to discuss this with Dr. Marin Olp at patient's next scheduled visit with Dr. Marin Olp.

## 2018-12-04 ENCOUNTER — Other Ambulatory Visit: Payer: Self-pay | Admitting: Family Medicine

## 2018-12-04 DIAGNOSIS — I493 Ventricular premature depolarization: Secondary | ICD-10-CM

## 2018-12-04 DIAGNOSIS — I10 Essential (primary) hypertension: Secondary | ICD-10-CM

## 2018-12-09 ENCOUNTER — Inpatient Hospital Stay: Payer: Medicare HMO

## 2018-12-09 ENCOUNTER — Inpatient Hospital Stay (HOSPITAL_BASED_OUTPATIENT_CLINIC_OR_DEPARTMENT_OTHER): Payer: Medicare HMO | Admitting: Hematology & Oncology

## 2018-12-09 ENCOUNTER — Other Ambulatory Visit: Payer: Self-pay

## 2018-12-09 ENCOUNTER — Encounter: Payer: Self-pay | Admitting: Hematology & Oncology

## 2018-12-09 VITALS — BP 132/72 | HR 64 | Temp 97.4°F | Resp 18 | Wt 164.0 lb

## 2018-12-09 DIAGNOSIS — Z5111 Encounter for antineoplastic chemotherapy: Secondary | ICD-10-CM | POA: Diagnosis not present

## 2018-12-09 DIAGNOSIS — C189 Malignant neoplasm of colon, unspecified: Secondary | ICD-10-CM

## 2018-12-09 DIAGNOSIS — C787 Secondary malignant neoplasm of liver and intrahepatic bile duct: Secondary | ICD-10-CM | POA: Diagnosis not present

## 2018-12-09 DIAGNOSIS — D500 Iron deficiency anemia secondary to blood loss (chronic): Secondary | ICD-10-CM

## 2018-12-09 DIAGNOSIS — R69 Illness, unspecified: Secondary | ICD-10-CM | POA: Diagnosis not present

## 2018-12-09 LAB — CBC WITH DIFFERENTIAL (CANCER CENTER ONLY)
Abs Immature Granulocytes: 0.01 10*3/uL (ref 0.00–0.07)
Basophils Absolute: 0 10*3/uL (ref 0.0–0.1)
Basophils Relative: 0 %
Eosinophils Absolute: 0 10*3/uL (ref 0.0–0.5)
Eosinophils Relative: 1 %
HCT: 37.2 % (ref 36.0–46.0)
Hemoglobin: 11.2 g/dL — ABNORMAL LOW (ref 12.0–15.0)
Immature Granulocytes: 0 %
Lymphocytes Relative: 29 %
Lymphs Abs: 1.5 10*3/uL (ref 0.7–4.0)
MCH: 24.4 pg — ABNORMAL LOW (ref 26.0–34.0)
MCHC: 30.1 g/dL (ref 30.0–36.0)
MCV: 81 fL (ref 80.0–100.0)
Monocytes Absolute: 0.8 10*3/uL (ref 0.1–1.0)
Monocytes Relative: 15 %
Neutro Abs: 2.9 10*3/uL (ref 1.7–7.7)
Neutrophils Relative %: 55 %
Platelet Count: 169 10*3/uL (ref 150–400)
RBC: 4.59 MIL/uL (ref 3.87–5.11)
RDW: 28.2 % — ABNORMAL HIGH (ref 11.5–15.5)
WBC Count: 5.2 10*3/uL (ref 4.0–10.5)
nRBC: 0 % (ref 0.0–0.2)

## 2018-12-09 LAB — CMP (CANCER CENTER ONLY)
ALT: 19 U/L (ref 0–44)
AST: 20 U/L (ref 15–41)
Albumin: 3.3 g/dL — ABNORMAL LOW (ref 3.5–5.0)
Alkaline Phosphatase: 84 U/L (ref 38–126)
Anion gap: 7 (ref 5–15)
BUN: 11 mg/dL (ref 8–23)
CO2: 30 mmol/L (ref 22–32)
Calcium: 8.9 mg/dL (ref 8.9–10.3)
Chloride: 104 mmol/L (ref 98–111)
Creatinine: 0.61 mg/dL (ref 0.44–1.00)
GFR, Est AFR Am: >60 mL/min (ref >60)
GFR, Estimated: >60 mL/min (ref >60)
Glucose, Bld: 157 mg/dL — ABNORMAL HIGH (ref 70–99)
Potassium: 3.8 mmol/L (ref 3.5–5.1)
Sodium: 141 mmol/L (ref 135–145)
Total Bilirubin: 0.5 mg/dL (ref 0.3–1.2)
Total Protein: 5.5 g/dL — ABNORMAL LOW (ref 6.5–8.1)

## 2018-12-09 MED ORDER — OXALIPLATIN CHEMO INJECTION 100 MG/20ML
68.0000 mg/m2 | Freq: Once | INTRAVENOUS | Status: AC
  Administered 2018-12-09: 130 mg via INTRAVENOUS
  Filled 2018-12-09: qty 20

## 2018-12-09 MED ORDER — LEUCOVORIN CALCIUM INJECTION 350 MG
400.0000 mg/m2 | Freq: Once | INTRAVENOUS | Status: AC
  Administered 2018-12-09: 776 mg via INTRAVENOUS
  Filled 2018-12-09: qty 38.8

## 2018-12-09 MED ORDER — SODIUM CHLORIDE 0.9% FLUSH
10.0000 mL | Freq: Once | INTRAVENOUS | Status: AC
  Administered 2018-12-09: 10 mL via INTRAVENOUS
  Filled 2018-12-09: qty 10

## 2018-12-09 MED ORDER — DEXTROSE 5 % IV SOLN
Freq: Once | INTRAVENOUS | Status: AC
  Administered 2018-12-09: 14:00:00 via INTRAVENOUS
  Filled 2018-12-09: qty 250

## 2018-12-09 MED ORDER — FLUOROURACIL CHEMO INJECTION 2.5 GM/50ML
320.0000 mg/m2 | Freq: Once | INTRAVENOUS | Status: AC
  Administered 2018-12-09: 600 mg via INTRAVENOUS
  Filled 2018-12-09: qty 12

## 2018-12-09 MED ORDER — PALONOSETRON HCL INJECTION 0.25 MG/5ML
INTRAVENOUS | Status: AC
  Filled 2018-12-09: qty 5

## 2018-12-09 MED ORDER — PALONOSETRON HCL INJECTION 0.25 MG/5ML
0.2500 mg | Freq: Once | INTRAVENOUS | Status: AC
  Administered 2018-12-09: 0.25 mg via INTRAVENOUS

## 2018-12-09 MED ORDER — SODIUM CHLORIDE 0.9 % IV SOLN
1920.0000 mg/m2 | INTRAVENOUS | Status: DC
  Administered 2018-12-09: 3700 mg via INTRAVENOUS
  Filled 2018-12-09: qty 74

## 2018-12-09 MED ORDER — DEXTROSE 5 % IV SOLN
Freq: Once | INTRAVENOUS | Status: AC
  Administered 2018-12-09: 12:00:00 via INTRAVENOUS
  Filled 2018-12-09: qty 250

## 2018-12-09 MED ORDER — SODIUM CHLORIDE 0.9 % IV SOLN
Freq: Once | INTRAVENOUS | Status: AC
  Administered 2018-12-09: 13:00:00 via INTRAVENOUS
  Filled 2018-12-09: qty 5

## 2018-12-09 NOTE — Patient Instructions (Signed)
Tunneled Central Venous Catheter Flushing Guide  It is important to flush your tunneled central venous catheter each time you use it, both before and after you use it. Flushing your catheter will help prevent it from clogging. What are the risks? Risks may include:  Infection.  Air getting into the catheter and bloodstream. Supplies needed:  A clean pair of gloves.  A disinfecting wipe. Use an alcohol wipe, chlorhexidine wipe, or iodine wipe as told by your health care provider.  A 10 mL syringe that has been prefilled with saline solution.  An empty 10 mL syringe, if a substance called heparin was injected into your catheter. How to flush your catheter When you flush your catheter, make sure you follow any specific instructions from your health care provider or the manufacturer. These are general guidelines. Flushing your catheter before use If there is heparin in your catheter: 1. Wash your hands with soap and water. 2. Put on gloves. 3. Scrub the injection cap for a minimum of 15 seconds with a disinfecting wipe. 4. Unclamp the catheter. 5. Attach the empty syringe to the injection cap. 6. Pull the syringe plunger back and withdraw 10 mL of blood. 7. Place the syringe into an appropriate waste container. 8. Scrub the injection cap for 15 seconds with a disinfecting wipe. 9. Attach the prefilled syringe to the injection cap. 10. Flush the catheter by pushing the plunger forward until all the liquid from the syringe is in the catheter. 11. Remove the syringe from the injection cap. 12. Clamp the catheter. If there is no heparin in your catheter: 1. Wash your hands with soap and water. 2. Put on gloves. 3. Scrub the injection cap for 15 seconds with a disinfecting wipe. 4. Unclamp the catheter. 5. Attach the prefilled syringe to the injection cap. 6. Flush the catheter by pushing the plunger forward until 5 mL of the liquid from the syringe is in the catheter. 7. Pull back on  the syringe until you see blood in the catheter. 8. If you have been asked to collect any blood, follow your health care provider's instructions. Otherwise, flush the catheter with the rest of the solution from the syringe. 9. Remove the syringe from the injection cap. 10. Clamp the catheter.  Flushing your catheter after use 1. Wash your hands with soap and water. 2. Put on gloves. 3. Scrub the injection cap for 15 seconds with a disinfecting wipe. 4. Unclamp the catheter. 5. Attach the prefilled syringe to the injection cap. 6. Flush the catheter by pushing the plunger forward until all of the liquid from the syringe is in the catheter. 7. Remove the syringe from the injection cap. 8. Clamp the catheter. Problems and solutions  If blood cannot be completely cleared from the injection cap, you may need to have the injection cap replaced.  If the catheter is difficult to flush, use the pulsing method. The pulsing method involves pushing only a few milliliters of solution into the catheter at a time and pausing between pushes.  If you do not see blood in the catheter when you pull back on the syringe, change your body position, such as by raising your arms above your head. Take a deep breath and cough. Then, pull back on the syringe. If you still do not see blood, flush the catheter with a small amount of solution. Then, change positions again and take a breath or cough. Pull back on the syringe again. If you still do not see   blood, finish flushing the catheter and contact your health care provider. Do not use your catheter until your health care provider says it is okay. General tips  Have someone help you flush your catheter, if possible.  Do not force fluid through your catheter.  Do not use a syringe that is larger or smaller than 10 mL. Using a smaller syringe can make the catheter burst.  Do not use your catheter without flushing it first if it has heparin in it. Contact a health  care provider if:  You cannot see any blood in the catheter when you flush it before using it.  Your catheter is difficult to flush. Get help right away if:  You cannot flush the catheter.  The catheter leaks when you flush it or when there is fluid in it.  There are cracks or breaks in the catheter. Summary  It is important to flush your tunneled central venous catheter each time you use it, both before and after you use it.  Scrub the injection cap for 15 seconds with a disinfecting wipe before and after you flush it.  When you flush your catheter, make sure you follow any specific instructions from your health care provider or the manufacturer.  Get help right away if you cannot flush the catheter. This information is not intended to replace advice given to you by your health care provider. Make sure you discuss any questions you have with your health care provider. Document Released: 03/30/2011 Document Revised: 06/26/2018 Document Reviewed: 06/26/2018 Elsevier Patient Education  2020 [employer]  

## 2018-12-09 NOTE — Patient Instructions (Signed)
Fluorouracil, 5-FU injection What is this medicine? FLUOROURACIL, 5-FU (flure oh YOOR a sil) is a chemotherapy drug. It slows the growth of cancer cells. This medicine is used to treat many types of cancer like breast cancer, colon or rectal cancer, pancreatic cancer, and stomach cancer. This medicine may be used for other purposes; ask your health care provider or pharmacist if you have questions. COMMON BRAND NAME(S): Adrucil What should I tell my health care provider before I take this medicine? They need to know if you have any of these conditions:  blood disorders  dihydropyrimidine dehydrogenase (DPD) deficiency  infection (especially a virus infection such as chickenpox, cold sores, or herpes)  kidney disease  liver disease  malnourished, poor nutrition  recent or ongoing radiation therapy  an unusual or allergic reaction to fluorouracil, other chemotherapy, other medicines, foods, dyes, or preservatives  pregnant or trying to get pregnant  breast-feeding How should I use this medicine? This drug is given as an infusion or injection into a vein. It is administered in a hospital or clinic by a specially trained health care professional. Talk to your pediatrician regarding the use of this medicine in children. Special care may be needed. Overdosage: If you think you have taken too much of this medicine contact a poison control center or emergency room at once. NOTE: This medicine is only for you. Do not share this medicine with others. What if I miss a dose? It is important not to miss your dose. Call your doctor or health care professional if you are unable to keep an appointment. What may interact with this medicine?  allopurinol  cimetidine  dapsone  digoxin  hydroxyurea  leucovorin  levamisole  medicines for seizures like ethotoin, fosphenytoin, phenytoin  medicines to increase blood counts like filgrastim, pegfilgrastim, sargramostim  medicines that  treat or prevent blood clots like warfarin, enoxaparin, and dalteparin  methotrexate  metronidazole  pyrimethamine  some other chemotherapy drugs like busulfan, cisplatin, estramustine, vinblastine  trimethoprim  trimetrexate  vaccines Talk to your doctor or health care professional before taking any of these medicines:  acetaminophen  aspirin  ibuprofen  ketoprofen  naproxen This list may not describe all possible interactions. Give your health care provider a list of all the medicines, herbs, non-prescription drugs, or dietary supplements you use. Also tell them if you smoke, drink alcohol, or use illegal drugs. Some items may interact with your medicine. What should I watch for while using this medicine? Visit your doctor for checks on your progress. This drug may make you feel generally unwell. This is not uncommon, as chemotherapy can affect healthy cells as well as cancer cells. Report any side effects. Continue your course of treatment even though you feel ill unless your doctor tells you to stop. In some cases, you may be given additional medicines to help with side effects. Follow all directions for their use. Call your doctor or health care professional for advice if you get a fever, chills or sore throat, or other symptoms of a cold or flu. Do not treat yourself. This drug decreases your body's ability to fight infections. Try to avoid being around people who are sick. This medicine may increase your risk to bruise or bleed. Call your doctor or health care professional if you notice any unusual bleeding. Be careful brushing and flossing your teeth or using a toothpick because you may get an infection or bleed more easily. If you have any dental work done, tell your dentist you are  receiving this medicine. Avoid taking products that contain aspirin, acetaminophen, ibuprofen, naproxen, or ketoprofen unless instructed by your doctor. These medicines may hide a fever. Do not  become pregnant while taking this medicine. Women should inform their doctor if they wish to become pregnant or think they might be pregnant. There is a potential for serious side effects to an unborn child. Talk to your health care professional or pharmacist for more information. Do not breast-feed an infant while taking this medicine. Men should inform their doctor if they wish to father a child. This medicine may lower sperm counts. Do not treat diarrhea with over the counter products. Contact your doctor if you have diarrhea that lasts more than 2 days or if it is severe and watery. This medicine can make you more sensitive to the sun. Keep out of the sun. If you cannot avoid being in the sun, wear protective clothing and use sunscreen. Do not use sun lamps or tanning beds/booths. What side effects may I notice from receiving this medicine? Side effects that you should report to your doctor or health care professional as soon as possible:  allergic reactions like skin rash, itching or hives, swelling of the face, lips, or tongue  low blood counts - this medicine may decrease the number of white blood cells, red blood cells and platelets. You may be at increased risk for infections and bleeding.  signs of infection - fever or chills, cough, sore throat, pain or difficulty passing urine  signs of decreased platelets or bleeding - bruising, pinpoint red spots on the skin, black, tarry stools, blood in the urine  signs of decreased red blood cells - unusually weak or tired, fainting spells, lightheadedness  breathing problems  changes in vision  chest pain  mouth sores  nausea and vomiting  pain, swelling, redness at site where injected  pain, tingling, numbness in the hands or feet  redness, swelling, or sores on hands or feet  stomach pain  unusual bleeding Side effects that usually do not require medical attention (report to your doctor or health care professional if they  continue or are bothersome):  changes in finger or toe nails  diarrhea  dry or itchy skin  hair loss  headache  loss of appetite  sensitivity of eyes to the light  stomach upset  unusually teary eyes This list may not describe all possible side effects. Call your doctor for medical advice about side effects. You may report side effects to FDA at 1-800-FDA-1088. Where should I keep my medicine? This drug is given in a hospital or clinic and will not be stored at home. NOTE: This sheet is a summary. It may not cover all possible information. If you have questions about this medicine, talk to your doctor, pharmacist, or health care provider.  2020 Elsevier/Gold Standard (2007-08-14 13:53:16) Leucovorin injection What is this medicine? LEUCOVORIN (loo koe VOR in) is used to prevent or treat the harmful effects of some medicines. This medicine is used to treat anemia caused by a low amount of folic acid in the body. It is also used with 5-fluorouracil (5-FU) to treat colon cancer. This medicine may be used for other purposes; ask your health care provider or pharmacist if you have questions. What should I tell my health care provider before I take this medicine? They need to know if you have any of these conditions:  anemia from low levels of vitamin B-12 in the blood  an unusual or allergic reaction to  leucovorin, folic acid, other medicines, foods, dyes, or preservatives  pregnant or trying to get pregnant  breast-feeding How should I use this medicine? This medicine is for injection into a muscle or into a vein. It is given by a health care professional in a hospital or clinic setting. Talk to your pediatrician regarding the use of this medicine in children. Special care may be needed. Overdosage: If you think you have taken too much of this medicine contact a poison control center or emergency room at once. NOTE: This medicine is only for you. Do not share this medicine with  others. What if I miss a dose? This does not apply. What may interact with this medicine?  capecitabine  fluorouracil  phenobarbital  phenytoin  primidone  trimethoprim-sulfamethoxazole This list may not describe all possible interactions. Give your health care provider a list of all the medicines, herbs, non-prescription drugs, or dietary supplements you use. Also tell them if you smoke, drink alcohol, or use illegal drugs. Some items may interact with your medicine. What should I watch for while using this medicine? Your condition will be monitored carefully while you are receiving this medicine. This medicine may increase the side effects of 5-fluorouracil, 5-FU. Tell your doctor or health care professional if you have diarrhea or mouth sores that do not get better or that get worse. What side effects may I notice from receiving this medicine? Side effects that you should report to your doctor or health care professional as soon as possible:  allergic reactions like skin rash, itching or hives, swelling of the face, lips, or tongue  breathing problems  fever, infection  mouth sores  unusual bleeding or bruising  unusually weak or tired Side effects that usually do not require medical attention (report to your doctor or health care professional if they continue or are bothersome):  constipation or diarrhea  loss of appetite  nausea, vomiting This list may not describe all possible side effects. Call your doctor for medical advice about side effects. You may report side effects to FDA at 1-800-FDA-1088. Where should I keep my medicine? This drug is given in a hospital or clinic and will not be stored at home. NOTE: This sheet is a summary. It may not cover all possible information. If you have questions about this medicine, talk to your doctor, pharmacist, or health care provider.  2020 Elsevier/Gold Standard (2007-10-15 16:50:29) Oxaliplatin Injection What is this  medicine? OXALIPLATIN (ox AL i PLA tin) is a chemotherapy drug. It targets fast dividing cells, like cancer cells, and causes these cells to die. This medicine is used to treat cancers of the colon and rectum, and many other cancers. This medicine may be used for other purposes; ask your health care provider or pharmacist if you have questions. COMMON BRAND NAME(S): Eloxatin What should I tell my health care provider before I take this medicine? They need to know if you have any of these conditions:  kidney disease  an unusual or allergic reaction to oxaliplatin, other chemotherapy, other medicines, foods, dyes, or preservatives  pregnant or trying to get pregnant  breast-feeding How should I use this medicine? This drug is given as an infusion into a vein. It is administered in a hospital or clinic by a specially trained health care professional. Talk to your pediatrician regarding the use of this medicine in children. Special care may be needed. Overdosage: If you think you have taken too much of this medicine contact a poison control center or  emergency room at once. NOTE: This medicine is only for you. Do not share this medicine with others. What if I miss a dose? It is important not to miss a dose. Call your doctor or health care professional if you are unable to keep an appointment. What may interact with this medicine?  medicines to increase blood counts like filgrastim, pegfilgrastim, sargramostim  probenecid  some antibiotics like amikacin, gentamicin, neomycin, polymyxin B, streptomycin, tobramycin  zalcitabine Talk to your doctor or health care professional before taking any of these medicines:  acetaminophen  aspirin  ibuprofen  ketoprofen  naproxen This list may not describe all possible interactions. Give your health care provider a list of all the medicines, herbs, non-prescription drugs, or dietary supplements you use. Also tell them if you smoke, drink  alcohol, or use illegal drugs. Some items may interact with your medicine. What should I watch for while using this medicine? Your condition will be monitored carefully while you are receiving this medicine. You will need important blood work done while you are taking this medicine. This medicine can make you more sensitive to cold. Do not drink cold drinks or use ice. Cover exposed skin before coming in contact with cold temperatures or cold objects. When out in cold weather wear warm clothing and cover your mouth and nose to warm the air that goes into your lungs. Tell your doctor if you get sensitive to the cold. This drug may make you feel generally unwell. This is not uncommon, as chemotherapy can affect healthy cells as well as cancer cells. Report any side effects. Continue your course of treatment even though you feel ill unless your doctor tells you to stop. In some cases, you may be given additional medicines to help with side effects. Follow all directions for their use. Call your doctor or health care professional for advice if you get a fever, chills or sore throat, or other symptoms of a cold or flu. Do not treat yourself. This drug decreases your body's ability to fight infections. Try to avoid being around people who are sick. This medicine may increase your risk to bruise or bleed. Call your doctor or health care professional if you notice any unusual bleeding. Be careful brushing and flossing your teeth or using a toothpick because you may get an infection or bleed more easily. If you have any dental work done, tell your dentist you are receiving this medicine. Avoid taking products that contain aspirin, acetaminophen, ibuprofen, naproxen, or ketoprofen unless instructed by your doctor. These medicines may hide a fever. Do not become pregnant while taking this medicine. Women should inform their doctor if they wish to become pregnant or think they might be pregnant. There is a potential  for serious side effects to an unborn child. Talk to your health care professional or pharmacist for more information. Do not breast-feed an infant while taking this medicine. Call your doctor or health care professional if you get diarrhea. Do not treat yourself. What side effects may I notice from receiving this medicine? Side effects that you should report to your doctor or health care professional as soon as possible:  allergic reactions like skin rash, itching or hives, swelling of the face, lips, or tongue  low blood counts - This drug may decrease the number of white blood cells, red blood cells and platelets. You may be at increased risk for infections and bleeding.  signs of infection - fever or chills, cough, sore throat, pain or difficulty passing  urine  signs of decreased platelets or bleeding - bruising, pinpoint red spots on the skin, black, tarry stools, nosebleeds  signs of decreased red blood cells - unusually weak or tired, fainting spells, lightheadedness  breathing problems  chest pain, pressure  cough  diarrhea  jaw tightness  mouth sores  nausea and vomiting  pain, swelling, redness or irritation at the injection site  pain, tingling, numbness in the hands or feet  problems with balance, talking, walking  redness, blistering, peeling or loosening of the skin, including inside the mouth  trouble passing urine or change in the amount of urine Side effects that usually do not require medical attention (report to your doctor or health care professional if they continue or are bothersome):  changes in vision  constipation  hair loss  loss of appetite  metallic taste in the mouth or changes in taste  stomach pain This list may not describe all possible side effects. Call your doctor for medical advice about side effects. You may report side effects to FDA at 1-800-FDA-1088. Where should I keep my medicine? This drug is given in a hospital or clinic  and will not be stored at home. NOTE: This sheet is a summary. It may not cover all possible information. If you have questions about this medicine, talk to your doctor, pharmacist, or health care provider.  2020 Elsevier/Gold Standard (2007-11-05 17:22:47)

## 2018-12-09 NOTE — Progress Notes (Signed)
Hematology and Oncology Follow Up Visit  Tonya [name] [id number] Jul 21, 1940 78 y.o. 12/09/2018   Principle Diagnosis:   Stage IV adenocarcinoma of the ascending colon -- NRAS+, BRAF-, HER2-, MSI/MMR -,   Iron deficiency anemia  Current Therapy:    FOLFOX -- s/p cycle #2  IV Iron -  Feraheme  -- given on 11/11/2018     Interim History:  Tonya [name] is back for follow-up.  She is doing okay.  She looks better.  Every time I see her, she is looking better.  She is eating okay.  She finally is eating small frequent meals.  She has through 3 big meals a day.  Her blood sugars are better controlled.  She has had no issues with nausea or vomiting.  There is no problems with bleeding.  She has had no diarrhea.  She denies any kind of tingling or numbness in her fingers or toes.  She has had no cough or shortness of breath.  Her last CEA was 7.2.    Overall, her performance status is ECOG 1.  Medications:  Current Outpatient Medications:  .  amLODipine (NORVASC) 10 MG tablet, Take 1 tablet (10 mg total) by mouth daily., Disp: 30 tablet, Rfl: 0 .  amLODipine (NORVASC) 5 MG tablet, , Disp: , Rfl:  .  aspirin EC 81 MG tablet, Take 81 mg by mouth daily., Disp: , Rfl:  .  atorvastatin (LIPITOR) 10 MG tablet, TAKE 1 TABLET(10 MG) BY MOUTH DAILY (Patient taking differently: Take 10 mg by mouth daily. ), Disp: 90 tablet, Rfl: 1 .  Blood Glucose Monitoring Suppl (ONETOUCH VERIO) w/Device KIT, Prick finger 2 times a day to test blood glucose level, Disp: 1 kit, Rfl: 0 .  Blood Pressure Monitoring (SPHYGMOMANOMETER) MISC, 1 Units by Does not apply route every morning., Disp: 1 each, Rfl: 0 .  Cholecalciferol 2000 units TABS, Take 1 tablet (2,000 Units total) by mouth daily., Disp: 90 tablet, Rfl: 1 .  dexamethasone (DECADRON) 4 MG tablet, Take 2 tablets (8 mg total) by mouth daily. Start the day after chemotherapy for 2 days. Take with food., Disp: 30 tablet, Rfl: 1 .  hydrochlorothiazide  (HYDRODIURIL) 25 MG tablet, Take 25 mg by mouth daily., Disp: , Rfl:  .  insulin degludec (TRESIBA FLEXTOUCH) 100 UNIT/ML SOPN FlexTouch Pen, Inject 0.15 mLs (15 Units total) into the skin daily., Disp: 5 pen, Rfl: 11 .  Insulin Pen Needle (PEN NEEDLES 31GX5/16") 31G X 8 MM MISC, 1 Device by Does not apply route daily., Disp: 100 each, Rfl: 3 .  Lancets (ONETOUCH ULTRASOFT) lancets, Ultra soft smooth 33 gauge. Prick finger 2 times a day to test blood glucose level (Patient taking differently: 1 each by Other route 2 (two) times a day. One Touch Verio smooth 33 gauge), Disp: 100 each, Rfl: 12 .  lidocaine-prilocaine (EMLA) cream, Apply to Portacath 1-2 hours prior to chemotherapy or blood draw., Disp: 30 g, Rfl: 3 .  metoprolol tartrate (LOPRESSOR) 25 MG tablet, TAKE 1/2 TABLET(12.5 MG) BY MOUTH TWICE DAILY, Disp: 60 tablet, Rfl: 1 .  ondansetron (ZOFRAN) 8 MG tablet, Take 1 tablet (8 mg total) by mouth 2 (two) times daily as needed for refractory nausea / vomiting. Start on day 3 after chemotherapy., Disp: 30 tablet, Rfl: 1 .  ONETOUCH VERIO test strip, 1 each by Other route 3 (three) times daily. USE TO TEST BLOOD SUGAR UP TO QID, Disp: 100 each, Rfl: 0 .  prochlorperazine (COMPAZINE) 10 MG tablet,  Take 1 tablet (10 mg total) by mouth every 6 (six) hours as needed (Nausea or vomiting)., Disp: 30 tablet, Rfl: 1 .  sitaGLIPtin (JANUVIA) 50 MG tablet, Take 1 tablet (50 mg total) by mouth daily. (Patient taking differently: Take 25 mg by mouth 2 (two) times a day. ), Disp: 90 tablet, Rfl: 3  Allergies:  Allergies  Allergen Reactions  . Iohexol Hives, Shortness Of Breath and Other (See Comments)    Patient developed hives and fullness in throat post injection of 125cc's Omni 300, Onset Date: 11/15/2006   . Hydrocodone Swelling and Other (See Comments)    "I started swelling, became red, and passed out"  . Ibuprofen Nausea Only and Other (See Comments)    "It made my stomach hurt"  . Naproxen Other  (See Comments)    "It made me feel out of my head"  . Propoxyphene N-Acetaminophen Other (See Comments)    "I couldn't find the door to make my way out of the room- I was in misery"    Past Medical History, Surgical history, Social history, and Family History were reviewed and updated.  Review of Systems: Review of Systems  Constitutional: Negative.   HENT:  Negative.   Eyes: Negative.   Respiratory: Negative.   Cardiovascular: Negative.   Gastrointestinal: Negative.   Endocrine: Negative.   Genitourinary: Negative.    Musculoskeletal: Negative.   Skin: Negative.   Neurological: Negative.   Hematological: Negative.   Psychiatric/Behavioral: Negative.     Physical Exam:  weight is 164 lb (74.4 kg). Her temporal temperature is 97.4 F (36.3 C) (abnormal). Her blood pressure is 132/72 and her pulse is 64. Her respiration is 18 and oxygen saturation is 100%.   Wt Readings from Last 3 Encounters:  12/09/18 164 lb (74.4 kg)  11/25/18 165 lb (74.8 kg)  11/20/18 164 lb 9.6 oz (74.7 kg)    Physical Exam Vitals signs reviewed.  HENT:     Head: Normocephalic and atraumatic.  Eyes:     Pupils: Pupils are equal, round, and reactive to light.  Neck:     Musculoskeletal: Normal range of motion.  Cardiovascular:     Rate and Rhythm: Normal rate and regular rhythm.     Heart sounds: Normal heart sounds.  Pulmonary:     Effort: Pulmonary effort is normal.     Breath sounds: Normal breath sounds.  Abdominal:     General: Bowel sounds are normal.     Palpations: Abdomen is soft.     Comments: Abdominal exam is soft.  There is no palpable hepatosplenomegaly.  She has no fluid wave.  There is no guarding or rebound tenderness.  Musculoskeletal: Normal range of motion.        General: No tenderness or deformity.  Lymphadenopathy:     Cervical: No cervical adenopathy.  Skin:    General: Skin is warm and dry.     Findings: No erythema or rash.  Neurological:     Mental Status: She  is alert and oriented to person, place, and time.     Comments: She has weakness on her right side.  This for the past stroke.  Otherwise, she has good strength on her left side.  Psychiatric:        Behavior: Behavior normal.        Thought Content: Thought content normal.        Judgment: Judgment normal.      Lab Results  Component Value Date   WBC  5.2 12/09/2018   HGB 11.2 (L) 12/09/2018   HCT 37.2 12/09/2018   MCV 81.0 12/09/2018   PLT 169 12/09/2018     Chemistry      Component Value Date/Time   NA 141 12/09/2018 1115   NA 139 09/30/2018 1311   K 3.8 12/09/2018 1115   CL 104 12/09/2018 1115   CO2 30 12/09/2018 1115   BUN 11 12/09/2018 1115   BUN 10 09/30/2018 1311   CREATININE 0.61 12/09/2018 1115      Component Value Date/Time   CALCIUM 8.9 12/09/2018 1115   ALKPHOS 84 12/09/2018 1115   AST 20 12/09/2018 1115   ALT 19 12/09/2018 1115   BILITOT 0.5 12/09/2018 1115       Impression and Plan: Ms. Kemnitz is an 78 year old African-American female.  She has metastatic colon cancer.  She has low I will consider oligometastatic disease.  Her CEA level which saw her was only 4.  Certainly cannot use is to monitor for disease response.  We will go ahead with her third cycle of chemotherapy.  After her fourth cycle I will repeat her scans.  If we have a good response in the liver, then I would then consider some form of intrahepatic therapy.  I just want her quality of life to be good.  I will see her back in 2 more weeks.   [name], MD 8/17/202012:16 PM

## 2018-12-10 LAB — IRON AND TIBC
Iron: 56 ug/dL (ref 41–142)
Saturation Ratios: 22 % (ref 21–57)
TIBC: 256 ug/dL (ref 236–444)
UIBC: 200 ug/dL (ref 120–384)

## 2018-12-10 LAB — CEA (IN HOUSE-CHCC): CEA (CHCC-In House): 8.93 ng/mL — ABNORMAL HIGH (ref 0.00–5.00)

## 2018-12-10 LAB — FERRITIN: Ferritin: 770 ng/mL — ABNORMAL HIGH (ref 11–307)

## 2018-12-11 ENCOUNTER — Other Ambulatory Visit: Payer: Self-pay

## 2018-12-11 ENCOUNTER — Inpatient Hospital Stay: Payer: Medicare HMO

## 2018-12-11 VITALS — BP 149/86 | HR 67 | Temp 96.8°F | Resp 18

## 2018-12-11 DIAGNOSIS — Z5111 Encounter for antineoplastic chemotherapy: Secondary | ICD-10-CM | POA: Diagnosis not present

## 2018-12-11 DIAGNOSIS — C189 Malignant neoplasm of colon, unspecified: Secondary | ICD-10-CM

## 2018-12-11 MED ORDER — SODIUM CHLORIDE 0.9% FLUSH
10.0000 mL | INTRAVENOUS | Status: DC | PRN
  Administered 2018-12-11: 10 mL
  Filled 2018-12-11: qty 10

## 2018-12-11 MED ORDER — HEPARIN SOD (PORK) LOCK FLUSH 100 UNIT/ML IV SOLN
500.0000 [IU] | Freq: Once | INTRAVENOUS | Status: AC | PRN
  Administered 2018-12-11: 15:00:00 500 [IU]
  Filled 2018-12-11: qty 5

## 2018-12-13 ENCOUNTER — Telehealth: Payer: Self-pay | Admitting: *Deleted

## 2018-12-13 ENCOUNTER — Other Ambulatory Visit: Payer: Self-pay | Admitting: *Deleted

## 2018-12-13 ENCOUNTER — Telehealth: Payer: Self-pay | Admitting: Endocrinology

## 2018-12-13 ENCOUNTER — Encounter: Payer: Self-pay | Admitting: Endocrinology

## 2018-12-13 MED ORDER — DRONABINOL 2.5 MG PO CAPS: 2.5000 mg | ORAL_CAPSULE | Freq: Two times a day (BID) | ORAL | 0 refills | Status: DC

## 2018-12-13 NOTE — Telephone Encounter (Signed)
Please advise if this can be scheduled as a VV?

## 2018-12-13 NOTE — Telephone Encounter (Signed)
Completed.

## 2018-12-13 NOTE — Telephone Encounter (Signed)
Patients daughter would like to know if it is ok since the patient is a diabetic to drink ginger ale?  Also, states she feels she needs to be seen because her sugars are not doing good.    Please Advise, Thanks

## 2018-12-13 NOTE — Telephone Encounter (Signed)
Call received from patients daughter [name] requesting a prescription for Marinol which was discussed with Dr. Marin Olp at pt.'s last visit per pt.'s daughter.  Dr. Marin Olp notified and prescription sent per order of Dr. Marin Olp.

## 2018-12-13 NOTE — Telephone Encounter (Signed)
Please refer to Dr. Cordelia Pen request to schedule pt for a VV to further discuss daughter's questions/concerns

## 2018-12-13 NOTE — Telephone Encounter (Signed)
VV is ok. Thank you

## 2018-12-16 ENCOUNTER — Ambulatory Visit (INDEPENDENT_AMBULATORY_CARE_PROVIDER_SITE_OTHER): Payer: Medicare HMO | Admitting: Endocrinology

## 2018-12-16 ENCOUNTER — Other Ambulatory Visit: Payer: Self-pay

## 2018-12-16 DIAGNOSIS — Z794 Long term (current) use of insulin: Secondary | ICD-10-CM

## 2018-12-16 DIAGNOSIS — E1149 Type 2 diabetes mellitus with other diabetic neurological complication: Secondary | ICD-10-CM

## 2018-12-16 DIAGNOSIS — E119 Type 2 diabetes mellitus without complications: Secondary | ICD-10-CM | POA: Insufficient documentation

## 2018-12-16 NOTE — Progress Notes (Signed)
Subjective:    Patient ID: Tonya [name], female    DOB: 1940/07/17, 78 y.o.   MRN: [id number]  HPI telehealth visit today via phone x 13 minutes.   Alternatives to telehealth are presented to this patient, and the patient agrees to the telehealth visit. Pt is advised of the cost of the visit, and agrees to this, also.   Patient is at home, and I am at the office.   Persons attending the telehealth visit: the patient, dtr Odette Horns), and I.  Pt returns for f/u of diabetes mellitus: DM type: Insulin-requiring type 2 (but lean body habitus raises possibility she is evolving type 1).   Dx'ed: 1155 Complications: CVA Therapy: insulin since 2020, and Januvia    GDM: never DKA: never Severe hypoglycemia: never.   Pancreatitis: never Pancreatic imaging: normal on 2010 [place].  Other: she did not tolerate metformin-XR (abd pain); she took insulin only for a brief time after diagnosis; edema limits rx options.   Interval history: dtr says cbg varies from 160-200's.  She takes 14 units QD.  Pt has 1 more chemo tx scheduled.   Past Medical History:  Diagnosis Date  . Cerebrovascular accident ([place])   . Chronic edema    ? venous insufficiency  . COPD (chronic obstructive pulmonary disease) ([place])    never had a problem breathing  . Diabetes mellitus without complication ([place])   . Enlarged heart    per pt  . Gallstones   . GERD (gastroesophageal reflux disease)   . Goals of care, counseling/discussion 11/01/2018  . Hyperlipidemia   . Hypertension     Past Surgical History:  Procedure Laterality Date  . ABDOMINAL HYSTERECTOMY    . BIOPSY  10/18/2018   Procedure: BIOPSY;  Surgeon: Jackquline Denmark, MD;  Location: [facility] ENDOSCOPY;  Service: Endoscopy;;  . CHOLECYSTECTOMY    . COLONOSCOPY WITH PROPOFOL N/A 10/18/2018   Procedure: COLONOSCOPY WITH PROPOFOL;  Surgeon: Jackquline Denmark, MD;  Location: [facility] ENDOSCOPY;  Service: Endoscopy;  Laterality: N/A;  . IR IMAGING GUIDED PORT INSERTION  11/11/2018   . POLYPECTOMY  10/18/2018   Procedure: POLYPECTOMY;  Surgeon: Jackquline Denmark, MD;  Location: [facility] ENDOSCOPY;  Service: Endoscopy;;  . SUBMUCOSAL TATTOO INJECTION  10/18/2018   Procedure: SUBMUCOSAL TATTOO INJECTION;  Surgeon: Jackquline Denmark, MD;  Location: [facility] ENDOSCOPY;  Service: Endoscopy;;    Social History   Socioeconomic History  . Marital status: Widowed    Spouse name: Not on file  . Number of children: 5  . Years of education: Not on file  . Highest education level: Not on file  Occupational History  . Not on file  Social Needs  . Financial resource strain: Not on file  . Food insecurity    Worry: Not on file    Inability: Not on file  . Transportation needs    Medical: Not on file    Non-medical: Not on file  Tobacco Use  . Smoking status: Former Smoker    Packs/day: 0.25    Years: 1.00    Pack years: 0.25    Quit date: 07/23/1968    Years since quitting: 50.4  . Smokeless tobacco: Never Used  Substance and Sexual Activity  . Alcohol use: No    Alcohol/week: 0.0 standard drinks  . Drug use: No  . Sexual activity: Not Currently  Lifestyle  . Physical activity    Days per week: Not on file    Minutes per session: Not on file  . Stress:  Not on file  Relationships  . Social Herbalist on phone: Not on file    Gets together: Not on file    Attends religious service: Not on file    Active member of club or organization: Not on file    Attends meetings of clubs or organizations: Not on file    Relationship status: Not on file  . Intimate partner violence    Fear of current or ex partner: Not on file    Emotionally abused: Not on file    Physically abused: Not on file    Forced sexual activity: Not on file  Other Topics Concern  . Not on file  Social History Narrative  . Not on file    Current Outpatient Medications on File Prior to Visit  Medication Sig Dispense Refill  . amLODipine (NORVASC) 10 MG tablet Take 1 tablet (10 mg total) by mouth daily.  30 tablet 0  . amLODipine (NORVASC) 5 MG tablet     . aspirin EC 81 MG tablet Take 81 mg by mouth daily.    Marland Kitchen atorvastatin (LIPITOR) 10 MG tablet TAKE 1 TABLET(10 MG) BY MOUTH DAILY (Patient taking differently: Take 10 mg by mouth daily. ) 90 tablet 1  . Blood Glucose Monitoring Suppl (ONETOUCH VERIO) w/Device KIT Prick finger 2 times a day to test blood glucose level 1 kit 0  . Blood Pressure Monitoring (SPHYGMOMANOMETER) MISC 1 Units by Does not apply route every morning. 1 each 0  . Cholecalciferol 2000 units TABS Take 1 tablet (2,000 Units total) by mouth daily. 90 tablet 1  . dexamethasone (DECADRON) 4 MG tablet Take 2 tablets (8 mg total) by mouth daily. Start the day after chemotherapy for 2 days. Take with food. 30 tablet 1  . dronabinol (MARINOL) 2.5 MG capsule Take 1 capsule (2.5 mg total) by mouth 2 (two) times daily before a meal. 60 capsule 0  . hydrochlorothiazide (HYDRODIURIL) 25 MG tablet Take 25 mg by mouth daily.    . insulin degludec (TRESIBA FLEXTOUCH) 100 UNIT/ML SOPN FlexTouch Pen Inject 0.15 mLs (15 Units total) into the skin daily. 5 pen 11  . Insulin Pen Needle (PEN NEEDLES 31GX5/16") 31G X 8 MM MISC 1 Device by Does not apply route daily. 100 each 3  . Lancets (ONETOUCH ULTRASOFT) lancets Ultra soft smooth 33 gauge. Prick finger 2 times a day to test blood glucose level (Patient taking differently: 1 each by Other route 2 (two) times a day. One Touch Verio smooth 33 gauge) 100 each 12  . lidocaine-prilocaine (EMLA) cream Apply to Portacath 1-2 hours prior to chemotherapy or blood draw. 30 g 3  . metoprolol tartrate (LOPRESSOR) 25 MG tablet TAKE 1/2 TABLET(12.5 MG) BY MOUTH TWICE DAILY 60 tablet 1  . ondansetron (ZOFRAN) 8 MG tablet Take 1 tablet (8 mg total) by mouth 2 (two) times daily as needed for refractory nausea / vomiting. Start on day 3 after chemotherapy. 30 tablet 1  . ONETOUCH VERIO test strip 1 each by Other route 3 (three) times daily. USE TO TEST BLOOD SUGAR UP  TO QID 100 each 0  . prochlorperazine (COMPAZINE) 10 MG tablet Take 1 tablet (10 mg total) by mouth every 6 (six) hours as needed (Nausea or vomiting). 30 tablet 1  . sitaGLIPtin (JANUVIA) 50 MG tablet Take 1 tablet (50 mg total) by mouth daily. (Patient taking differently: Take 25 mg by mouth 2 (two) times a day. ) 90 tablet 3  No current facility-administered medications on file prior to visit.     Allergies  Allergen Reactions  . Iohexol Hives, Shortness Of Breath and Other (See Comments)    Patient developed hives and fullness in throat post injection of 125cc's Omni 300, Onset Date: 11/15/2006   . Hydrocodone Swelling and Other (See Comments)    "I started swelling, became red, and passed out"  . Ibuprofen Nausea Only and Other (See Comments)    "It made my stomach hurt"  . Naproxen Other (See Comments)    "It made me feel out of my head"  . Propoxyphene N-Acetaminophen Other (See Comments)    "I couldn't find the door to make my way out of the room- I was in misery"    Family History  Problem Relation Age of Onset  . Cirrhosis Mother   . Heart disease Father   . Cancer Other   . Hypertension Other   . Diabetes Other     Ht _0  (1.803 m)   BMI 22.87 kg/m    Review of Systems She denies hypoglycemia.      Objective:   Physical Exam    Lab Results  Component Value Date   HGBA1C 8.0 (A) 11/20/2018   Lab Results  Component Value Date   CREATININE 0.61 12/09/2018   BUN 11 12/09/2018   NA 141 12/09/2018   K 3.8 12/09/2018   CL 104 12/09/2018   CO2 30 12/09/2018      Assessment & Plan:  Insulin-requiring type 2 DM, with CVA: Based on the pattern of her cbg's, she may need a faster-acting insulin.   Colon cancer: we'll reassess glycemic control when she is done with chemotx.    Patient Instructions  Please increase the insulin to 18 units daily.   Please come back for a follow-up appointment in 2-3 weeks.  Please see our dietician the same day.    Please continue the same [place].   check your blood sugar twice a day.  vary the time of day when you check, between before the 3 meals, and at bedtime.  also check if you have symptoms of your blood sugar being too high or too low.  please keep a record of the readings and bring it to your next appointment here (or you can bring the meter itself).  You can write it on any piece of paper.  please call us sooner if your blood sugar goes below 70, or if you have a lot of readings over 200.

## 2018-12-16 NOTE — Patient Instructions (Addendum)
Please increase the insulin to 18 units daily.   Please come back for a follow-up appointment in 2-3 weeks.  Please see our dietician the same day.   Please continue the same [place].   check your blood sugar twice a day.  vary the time of day when you check, between before the 3 meals, and at bedtime.  also check if you have symptoms of your blood sugar being too high or too low.  please keep a record of the readings and bring it to your next appointment here (or you can bring the meter itself).  You can write it on any piece of paper.  please call us sooner if your blood sugar goes below 70, or if you have a lot of readings over 200.

## 2018-12-18 ENCOUNTER — Telehealth: Payer: Self-pay | Admitting: *Deleted

## 2018-12-18 NOTE — Telephone Encounter (Signed)
Received a call from [place], patients daughter stating that patient has had a very hard time since her last chemo treatment.  She has been in bed for 3-4 days and just today feels like sitting upright.  Has had a sore throat and dizziness.  Offered to bring patient in to be seen but patient daughter said patient due to see dr Marin Olp on Monday and they could wait until then.  Dr. Marin Olp notified of above.

## 2018-12-23 ENCOUNTER — Inpatient Hospital Stay: Payer: Medicare HMO

## 2018-12-23 ENCOUNTER — Encounter: Payer: Self-pay | Admitting: Hematology & Oncology

## 2018-12-23 ENCOUNTER — Other Ambulatory Visit: Payer: Self-pay

## 2018-12-23 ENCOUNTER — Inpatient Hospital Stay (HOSPITAL_BASED_OUTPATIENT_CLINIC_OR_DEPARTMENT_OTHER): Payer: Medicare HMO | Admitting: Hematology & Oncology

## 2018-12-23 VITALS — BP 136/73 | HR 74 | Temp 97.2°F | Resp 20 | Wt 166.0 lb

## 2018-12-23 DIAGNOSIS — C189 Malignant neoplasm of colon, unspecified: Secondary | ICD-10-CM | POA: Diagnosis not present

## 2018-12-23 DIAGNOSIS — Z5111 Encounter for antineoplastic chemotherapy: Secondary | ICD-10-CM | POA: Diagnosis not present

## 2018-12-23 DIAGNOSIS — C787 Secondary malignant neoplasm of liver and intrahepatic bile duct: Secondary | ICD-10-CM | POA: Diagnosis not present

## 2018-12-23 LAB — CBC WITH DIFFERENTIAL (CANCER CENTER ONLY)
Abs Immature Granulocytes: 0.01 10*3/uL (ref 0.00–0.07)
Basophils Absolute: 0 10*3/uL (ref 0.0–0.1)
Basophils Relative: 0 %
Eosinophils Absolute: 0 10*3/uL (ref 0.0–0.5)
Eosinophils Relative: 0 %
HCT: 36.4 % (ref 36.0–46.0)
Hemoglobin: 11.2 g/dL — ABNORMAL LOW (ref 12.0–15.0)
Immature Granulocytes: 0 %
Lymphocytes Relative: 21 %
Lymphs Abs: 1.1 10*3/uL (ref 0.7–4.0)
MCH: 25.9 pg — ABNORMAL LOW (ref 26.0–34.0)
MCHC: 30.8 g/dL (ref 30.0–36.0)
MCV: 84.1 fL (ref 80.0–100.0)
Monocytes Absolute: 0.7 10*3/uL (ref 0.1–1.0)
Monocytes Relative: 12 %
Neutro Abs: 3.6 10*3/uL (ref 1.7–7.7)
Neutrophils Relative %: 67 %
Platelet Count: 107 10*3/uL — ABNORMAL LOW (ref 150–400)
RBC: 4.33 MIL/uL (ref 3.87–5.11)
RDW: 24.9 % — ABNORMAL HIGH (ref 11.5–15.5)
WBC Count: 5.4 10*3/uL (ref 4.0–10.5)
nRBC: 0 % (ref 0.0–0.2)

## 2018-12-23 LAB — CMP (CANCER CENTER ONLY)
ALT: 20 U/L (ref 0–44)
AST: 22 U/L (ref 15–41)
Albumin: 3.3 g/dL — ABNORMAL LOW (ref 3.5–5.0)
Alkaline Phosphatase: 81 U/L (ref 38–126)
Anion gap: 7 (ref 5–15)
BUN: 15 mg/dL (ref 8–23)
CO2: 29 mmol/L (ref 22–32)
Calcium: 9.3 mg/dL (ref 8.9–10.3)
Chloride: 105 mmol/L (ref 98–111)
Creatinine: 0.77 mg/dL (ref 0.44–1.00)
GFR, Est AFR Am: >60 mL/min (ref >60)
GFR, Estimated: >60 mL/min (ref >60)
Glucose, Bld: 243 mg/dL — ABNORMAL HIGH (ref 70–99)
Potassium: 3.7 mmol/L (ref 3.5–5.1)
Sodium: 141 mmol/L (ref 135–145)
Total Bilirubin: 0.5 mg/dL (ref 0.3–1.2)
Total Protein: 5.7 g/dL — ABNORMAL LOW (ref 6.5–8.1)

## 2018-12-23 LAB — IRON AND TIBC
Iron: 52 ug/dL (ref 41–142)
Saturation Ratios: 20 % — ABNORMAL LOW (ref 21–57)
TIBC: 268 ug/dL (ref 236–444)
UIBC: 215 ug/dL (ref 120–384)

## 2018-12-23 LAB — CEA (IN HOUSE-CHCC): CEA (CHCC-In House): 10.48 ng/mL — ABNORMAL HIGH (ref 0.00–5.00)

## 2018-12-23 LAB — FERRITIN: Ferritin: 626 ng/mL — ABNORMAL HIGH (ref 11–307)

## 2018-12-23 MED ORDER — PALONOSETRON HCL INJECTION 0.25 MG/5ML
0.2500 mg | Freq: Once | INTRAVENOUS | Status: AC
  Administered 2018-12-23: 11:00:00 0.25 mg via INTRAVENOUS

## 2018-12-23 MED ORDER — FLUOROURACIL CHEMO INJECTION 2.5 GM/50ML
320.0000 mg/m2 | Freq: Once | INTRAVENOUS | Status: AC
  Administered 2018-12-23: 15:00:00 600 mg via INTRAVENOUS
  Filled 2018-12-23: qty 12

## 2018-12-23 MED ORDER — SODIUM CHLORIDE 0.9 % IV SOLN
Freq: Once | INTRAVENOUS | Status: AC
  Administered 2018-12-23: 12:00:00 via INTRAVENOUS
  Filled 2018-12-23: qty 5

## 2018-12-23 MED ORDER — PALONOSETRON HCL INJECTION 0.25 MG/5ML
INTRAVENOUS | Status: AC
  Filled 2018-12-23: qty 5

## 2018-12-23 MED ORDER — LEUCOVORIN CALCIUM INJECTION 350 MG
400.0000 mg/m2 | Freq: Once | INTRAVENOUS | Status: AC
  Administered 2018-12-23: 13:00:00 776 mg via INTRAVENOUS
  Filled 2018-12-23: qty 38.8

## 2018-12-23 MED ORDER — DEXTROSE 5 % IV SOLN
Freq: Once | INTRAVENOUS | Status: AC
  Administered 2018-12-23: 11:00:00 via INTRAVENOUS
  Filled 2018-12-23: qty 250

## 2018-12-23 MED ORDER — HEPARIN SOD (PORK) LOCK FLUSH 100 UNIT/ML IV SOLN
500.0000 [IU] | Freq: Once | INTRAVENOUS | Status: DC | PRN
  Filled 2018-12-23: qty 5

## 2018-12-23 MED ORDER — SODIUM CHLORIDE 0.9% FLUSH
10.0000 mL | INTRAVENOUS | Status: DC | PRN
  Filled 2018-12-23: qty 10

## 2018-12-23 MED ORDER — OXALIPLATIN CHEMO INJECTION 100 MG/20ML
61.2000 mg/m2 | Freq: Once | INTRAVENOUS | Status: AC
  Administered 2018-12-23: 13:00:00 120 mg via INTRAVENOUS
  Filled 2018-12-23: qty 20

## 2018-12-23 MED ORDER — SODIUM CHLORIDE 0.9 % IV SOLN
1920.0000 mg/m2 | INTRAVENOUS | Status: DC
  Administered 2018-12-23: 15:00:00 3700 mg via INTRAVENOUS
  Filled 2018-12-23: qty 74

## 2018-12-23 NOTE — Patient Instructions (Signed)
[facility] Discharge Instructions for Patients receiving Home Portable Chemo Pump    **The bag should finish at 46 hours, 96 hours or 7 days. For example, if your pump is scheduled for 46 hours and it was put on at 4pm, it should finish at 2 pm the day it is scheduled to come off regardless of your appointment time.    Estimated time to finish   _________________________ (Have your nurse fill in)     ** if the display on your pump reads "Low Volume" and it is beeping, take the batteries out of the pump and come to the cancer center for it to be taken off.   **If the pump alarms go off prior to the pump reading "Low Volume" then call the [phone] and someone can assist you.  **If the plunger comes out and the bag fluid is running out, please use your chemo spill kit to clean up the spill. Do not use paper towels or other house hold products.  ** If you have problems or questions regarding your pump, please call either the [phone] or the cancer center Monday-Friday 8:00am-4:30pm at [phone] and we will assist you.  If you are unable to get assistance then go to [facility] Emergency Room, ask the staff to contact the IV team for assistance.   Preparing for Chemotherapy, Adult Chemotherapy is a way to treat cancer. It uses medicines to slow down or stop the growth of cancer. Depending on the type and stage of your cancer, you may have chemotherapy to:  Cure the cancer.  Prevent the cancer from growing or spreading (metastasizing).  Ease symptoms and improve your quality of life (palliative care).  Improve the effects of radiation treatment.  Shrink a tumor before surgery.  Rid your body of cancer cells that remain after a tumor is surgically removed. How is chemotherapy given? Chemotherapy may be given in many ways. Some common ways include:  As a pill or capsule.  By a shot (injection).  As a skin (topical) cream.  As a wafer that is put  in your body where the cancer is. The wafer has chemotherapy medicine in it.  As an injection into the cerebrospinal fluid (CSF) in the brain or spinal cord (intraventricular or intrathecal chemotherapy).  Through a small, thin tube (catheter). There are different kinds of catheters. You might have one that: ? Goes into a vein (intravenous catheter). An IV may be inserted into a vein each time you get a treatment. ? Connects to a port that is inserted under the skin of your chest. A port catheter connects the port to a large vein in your chest or upper arm. A needle is inserted through your skin into the port during each treatment. The port may be kept in place for many weeks or months. ? Goes into a vein near your elbow (PICC line). It may be used for weeks or months. ? Goes into a vein near your neck that leads to your heart (non-tunneled catheter). This catheter has a risk of infection, so it is used for only a short time. ? Goes through the skin of your chest and into a large vein that leads to your heart (tunneled catheter). It can be kept in your body for months or years. The way that you will get chemotherapy medicines depends on your condition. What can I do to prepare for the treatment? Do these things so you feel ready for your treatment:  Find  out as much as you can about your treatment. Ask your health care provider for trusted sources (Internet sites, books, videos, and people) to help you learn about the treatment that you will be having.  Talk with your health care provider about all medicines, vitamins, and supplements that you take. Some vitamins and supplements should not be taken during chemotherapy because they may interfere with the treatment.  Ask your health care provider what to expect. Write down any questions you have and keep track of the answers.  If you work, ask how the treatment will affect your ability to do your job. If needed, meet with your manager to discuss a  modified work schedule or time away from work during your chemotherapy treatment.  Meet with a Education officer, museum or patient navigator to discuss any financial concerns you have about the cost of your chemotherapy medicine. Ask about resources such as patient assistance programs and grants to help with other related costs.  Learn about eating well during treatment. Chemotherapy can change what you want to eat and how your body feels. Choose nutritious foods and drinks that taste good to you. It might help to talk to a nutrition specialist (dietitian) about what to eat to stay healthy.  If you are concerned about how the chemotherapy treatment may affect your ability to have children (fertility), ask to meet with a specialist prior to starting treatment to discuss ways to preserve your fertility.  You might lose some hair because of chemotherapy. Decide how you want to manage this. Find a wig, scarf, or hat that you like. Think about shaving your head before treatment starts.  Go to your dentist for a checkup before treatment starts.  If you have a port, a PICC line, or a long-term catheter, learn how to take care of it.  Pack things to bring with you to the treatment center, like a snack, movies, music, books, or a smartphone or tablet. What questions should I ask my health care provider? Ask your health care provider these questions:  Which kind of chemotherapy would be best for me? Why?  How often will I have treatment?  How does the treatment work?  Does the clinic or hospital have support groups for adults with cancer?  Can I do all my usual activities? What activities are off-limits?  What are the most common side effects?  Are there any long-lasting or permanent side effects?  Will this treatment affect my fertility?  Whom do I call if I have problems after a treatment? Summary  Chemotherapy is a way to treat cancer. It uses medicines to slow down or stop the growth of cancer.   The goal of chemotherapy depends on the type and stage of your cancer.  Chemotherapy may be given in many ways. The most common ways are by IV, by a shot (injection), or in a pill form.  Find out as much as you can about your treatment. Ask your health care provider for trusted sources (Internet sites, books, videos, and people) to help you learn about the treatment you will be having.  Before you start chemotherapy, ask your health care provider any questions you have about your treatment. This information is not intended to replace advice given to you by your health care provider. Make sure you discuss any questions you have with your health care provider. Document Released: 12/22/2016 Document Revised: 03/23/2017 Document Reviewed: 12/22/2016 Elsevier Patient Education  2020 [employer].

## 2018-12-23 NOTE — Progress Notes (Signed)
Hematology and Oncology Follow Up Visit  Tonya [name] [id number] 12-16-1940 78 y.o. 12/23/2018   Principle Diagnosis:   Stage IV adenocarcinoma of the ascending colon -- NRAS+, BRAF-, HER2-, MSI/MMR -,   Iron deficiency anemia  Current Therapy:    FOLFOX -- s/p cycle #3  IV Iron -  Feraheme  -- given on 11/11/2018     Interim History:  Tonya [name] is back for follow-up.  She had more of a difficult time with this round of chemotherapy.  She was not eating all that well.  She is having some dizziness.  She had no diarrhea.  She is having some abdominal discomfort.  Her last CEA was 8.9.  This actually has gone up a little bit.  She has had no mouth sores.  I told her to make sure that she rinses her mouth with both Biotene and sodium bicarb.  She does have occasional dizziness.  I am not sure this is from the chemotherapy.  She is having some neck pain on the right side.  Again, I am not sure this is from the chemotherapy.  Overall, her performance status is ECOG 1.  Medications:  Current Outpatient Medications:  .  amLODipine (NORVASC) 10 MG tablet, Take 1 tablet (10 mg total) by mouth daily., Disp: 30 tablet, Rfl: 0 .  aspirin EC 81 MG tablet, Take 81 mg by mouth daily., Disp: , Rfl:  .  atorvastatin (LIPITOR) 10 MG tablet, TAKE 1 TABLET(10 MG) BY MOUTH DAILY (Patient taking differently: Take 10 mg by mouth daily. ), Disp: 90 tablet, Rfl: 1 .  Blood Glucose Monitoring Suppl (ONETOUCH VERIO) w/Device KIT, Prick finger 2 times a day to test blood glucose level, Disp: 1 kit, Rfl: 0 .  Blood Pressure Monitoring (SPHYGMOMANOMETER) MISC, 1 Units by Does not apply route every morning., Disp: 1 each, Rfl: 0 .  Cholecalciferol 2000 units TABS, Take 1 tablet (2,000 Units total) by mouth daily., Disp: 90 tablet, Rfl: 1 .  dexamethasone (DECADRON) 4 MG tablet, Take 2 tablets (8 mg total) by mouth daily. Start the day after chemotherapy for 2 days. Take with food., Disp: 30 tablet, Rfl: 1  .  insulin degludec (TRESIBA FLEXTOUCH) 100 UNIT/ML SOPN FlexTouch Pen, Inject 0.15 mLs (15 Units total) into the skin daily. (Patient taking differently: Inject 18 Units into the skin daily. ), Disp: 5 pen, Rfl: 11 .  Insulin Pen Needle (PEN NEEDLES 31GX5/16") 31G X 8 MM MISC, 1 Device by Does not apply route daily., Disp: 100 each, Rfl: 3 .  Lancets (ONETOUCH ULTRASOFT) lancets, Ultra soft smooth 33 gauge. Prick finger 2 times a day to test blood glucose level (Patient taking differently: 1 each by Other route 2 (two) times a day. One Touch Verio smooth 33 gauge), Disp: 100 each, Rfl: 12 .  lidocaine-prilocaine (EMLA) cream, Apply to Portacath 1-2 hours prior to chemotherapy or blood draw., Disp: 30 g, Rfl: 3 .  metoprolol tartrate (LOPRESSOR) 25 MG tablet, TAKE 1/2 TABLET(12.5 MG) BY MOUTH TWICE DAILY, Disp: 60 tablet, Rfl: 1 .  ONETOUCH VERIO test strip, 1 each by Other route 3 (three) times daily. USE TO TEST BLOOD SUGAR UP TO QID, Disp: 100 each, Rfl: 0 .  sitaGLIPtin (JANUVIA) 50 MG tablet, Take 1 tablet (50 mg total) by mouth daily., Disp: 90 tablet, Rfl: 3 .  amLODipine (NORVASC) 5 MG tablet, , Disp: , Rfl:  .  dronabinol (MARINOL) 2.5 MG capsule, Take 1 capsule (2.5 mg total) by mouth  2 (two) times daily before a meal. (Patient not taking: Reported on 12/23/2018), Disp: 60 capsule, Rfl: 0 .  hydrochlorothiazide (HYDRODIURIL) 25 MG tablet, Take 25 mg by mouth daily., Disp: , Rfl:  .  ondansetron (ZOFRAN) 8 MG tablet, Take 1 tablet (8 mg total) by mouth 2 (two) times daily as needed for refractory nausea / vomiting. Start on day 3 after chemotherapy. (Patient not taking: Reported on 12/23/2018), Disp: 30 tablet, Rfl: 1 .  prochlorperazine (COMPAZINE) 10 MG tablet, Take 1 tablet (10 mg total) by mouth every 6 (six) hours as needed (Nausea or vomiting). (Patient not taking: Reported on 12/23/2018), Disp: 30 tablet, Rfl: 1  Allergies:  Allergies  Allergen Reactions  . Iohexol Hives, Shortness Of  Breath and Other (See Comments)    Patient developed hives and fullness in throat post injection of 125cc's Omni 300, Onset Date: 11/15/2006   . Hydrocodone Swelling and Other (See Comments)    "I started swelling, became red, and passed out"  . Ibuprofen Nausea Only and Other (See Comments)    "It made my stomach hurt"  . Naproxen Other (See Comments)    "It made me feel out of my head"  . Propoxyphene N-Acetaminophen Other (See Comments)    "I couldn't find the door to make my way out of the room- I was in misery"    Past Medical History, Surgical history, Social history, and Family History were reviewed and updated.  Review of Systems: Review of Systems  Constitutional: Negative.   HENT:  Negative.   Eyes: Negative.   Respiratory: Negative.   Cardiovascular: Negative.   Gastrointestinal: Negative.   Endocrine: Negative.   Genitourinary: Negative.    Musculoskeletal: Negative.   Skin: Negative.   Neurological: Negative.   Hematological: Negative.   Psychiatric/Behavioral: Negative.     Physical Exam:  weight is 166 lb (75.3 kg). Her temporal temperature is 97.2 F (36.2 C) (abnormal). Her blood pressure is 136/73 and her pulse is 74. Her respiration is 20 and oxygen saturation is 100%.   Wt Readings from Last 3 Encounters:  12/23/18 166 lb (75.3 kg)  12/09/18 164 lb (74.4 kg)  11/25/18 165 lb (74.8 kg)    Physical Exam Vitals signs reviewed.  HENT:     Head: Normocephalic and atraumatic.  Eyes:     Pupils: Pupils are equal, round, and reactive to light.  Neck:     Musculoskeletal: Normal range of motion.  Cardiovascular:     Rate and Rhythm: Normal rate and regular rhythm.     Heart sounds: Normal heart sounds.  Pulmonary:     Effort: Pulmonary effort is normal.     Breath sounds: Normal breath sounds.  Abdominal:     General: Bowel sounds are normal.     Palpations: Abdomen is soft.     Comments: Abdominal exam is soft.  There is no palpable  hepatosplenomegaly.  She has no fluid wave.  There is no guarding or rebound tenderness.  Musculoskeletal: Normal range of motion.        General: No tenderness or deformity.  Lymphadenopathy:     Cervical: No cervical adenopathy.  Skin:    General: Skin is warm and dry.     Findings: No erythema or rash.  Neurological:     Mental Status: She is alert and oriented to person, place, and time.     Comments: She has weakness on her right side.  This for the past stroke.  Otherwise, she has  good strength on her left side.  Psychiatric:        Behavior: Behavior normal.        Thought Content: Thought content normal.        Judgment: Judgment normal.      Lab Results  Component Value Date   WBC 5.4 12/23/2018   HGB 11.2 (L) 12/23/2018   HCT 36.4 12/23/2018   MCV 84.1 12/23/2018   PLT 107 (L) 12/23/2018     Chemistry      Component Value Date/Time   NA 141 12/09/2018 1115   NA 139 09/30/2018 1311   K 3.8 12/09/2018 1115   CL 104 12/09/2018 1115   CO2 30 12/09/2018 1115   BUN 11 12/09/2018 1115   BUN 10 09/30/2018 1311   CREATININE 0.61 12/09/2018 1115      Component Value Date/Time   CALCIUM 8.9 12/09/2018 1115   ALKPHOS 84 12/09/2018 1115   AST 20 12/09/2018 1115   ALT 19 12/09/2018 1115   BILITOT 0.5 12/09/2018 1115       Impression and Plan: Ms. Arutyunyan is an 78 year old African-American female.  She has metastatic colon cancer.  She has low I will consider oligometastatic disease.    We will go ahead and plan for a follow-up scan when we see her back next time.  This will tell us how things are going.  By the CEA, I just worry little bit that we are not seeing a response.  If we do not see a response, then we will have to make a change with treatment.  I would clearly moved to [place] can.  I do not think we can use Avastin given the history of her CVA.  I will have her come back in 3 weeks now.  I think an extra week off would certainly help her out.     [name], MD 8/31/20209:23 AM

## 2018-12-23 NOTE — Patient Instructions (Signed)
Implanted Port Insertion, Care After This sheet gives you information about how to care for yourself after your procedure. Your health care provider may also give you more specific instructions. If you have problems or questions, contact your health care provider. What can I expect after the procedure? After the procedure, it is common to have:  Discomfort at the port insertion site.  Bruising on the skin over the port. This should improve over 3-4 days. Follow these instructions at home: Port care  After your port is placed, you will get a manufacturer's information card. The card has information about your port. Keep this card with you at all times.  Take care of the port as told by your health care provider. Ask your health care provider if you or a family member can get training for taking care of the port at home. A home health care nurse may also take care of the port.  Make sure to remember what type of port you have. Incision care      Follow instructions from your health care provider about how to take care of your port insertion site. Make sure you: ? Wash your hands with soap and water before and after you change your bandage (dressing). If soap and water are not available, use hand sanitizer. ? Change your dressing as told by your health care provider. ? Leave stitches (sutures), skin glue, or adhesive strips in place. These skin closures may need to stay in place for 2 weeks or longer. If adhesive strip edges start to loosen and curl up, you may trim the loose edges. Do not remove adhesive strips completely unless your health care provider tells you to do that.  Check your port insertion site every day for signs of infection. Check for: ? Redness, swelling, or pain. ? Fluid or blood. ? Warmth. ? Pus or a bad smell. Activity  Return to your normal activities as told by your health care provider. Ask your health care provider what activities are safe for you.  Do not  lift anything that is heavier than 10 lb (4.5 kg), or the limit that you are told, until your health care provider says that it is safe. General instructions  Take over-the-counter and prescription medicines only as told by your health care provider.  Do not take baths, swim, or use a hot tub until your health care provider approves. Ask your health care provider if you may take showers. You may only be allowed to take sponge baths.  Do not drive for 24 hours if you were given a sedative during your procedure.  Wear a medical alert bracelet in case of an emergency. This will tell any health care providers that you have a port.  Keep all follow-up visits as told by your health care provider. This is important. Contact a health care provider if:  You cannot flush your port with saline as directed, or you cannot draw blood from the port.  You have a fever or chills.  You have redness, swelling, or pain around your port insertion site.  You have fluid or blood coming from your port insertion site.  Your port insertion site feels warm to the touch.  You have pus or a bad smell coming from the port insertion site. Get help right away if:  You have chest pain or shortness of breath.  You have bleeding from your port that you cannot control. Summary  Take care of the port as told by your health   care provider. Keep the manufacturer's information card with you at all times.  Change your dressing as told by your health care provider.  Contact a health care provider if you have a fever or chills or if you have redness, swelling, or pain around your port insertion site.  Keep all follow-up visits as told by your health care provider. This information is not intended to replace advice given to you by your health care provider. Make sure you discuss any questions you have with your health care provider. Document Released: 01/29/2013 Document Revised: 11/06/2017 Document Reviewed: 11/06/2017  Elsevier Patient Education  2020 [employer]  

## 2018-12-25 ENCOUNTER — Other Ambulatory Visit: Payer: Self-pay

## 2018-12-25 ENCOUNTER — Inpatient Hospital Stay: Payer: Medicare HMO | Attending: Hematology & Oncology

## 2018-12-25 VITALS — BP 156/84 | HR 75 | Temp 97.5°F | Resp 16

## 2018-12-25 DIAGNOSIS — C182 Malignant neoplasm of ascending colon: Secondary | ICD-10-CM | POA: Diagnosis present

## 2018-12-25 DIAGNOSIS — D509 Iron deficiency anemia, unspecified: Secondary | ICD-10-CM | POA: Insufficient documentation

## 2018-12-25 DIAGNOSIS — Z79899 Other long term (current) drug therapy: Secondary | ICD-10-CM | POA: Insufficient documentation

## 2018-12-25 DIAGNOSIS — C787 Secondary malignant neoplasm of liver and intrahepatic bile duct: Secondary | ICD-10-CM

## 2018-12-25 DIAGNOSIS — Z5111 Encounter for antineoplastic chemotherapy: Secondary | ICD-10-CM | POA: Insufficient documentation

## 2018-12-25 DIAGNOSIS — C189 Malignant neoplasm of colon, unspecified: Secondary | ICD-10-CM

## 2018-12-25 DIAGNOSIS — Z5112 Encounter for antineoplastic immunotherapy: Secondary | ICD-10-CM | POA: Diagnosis not present

## 2018-12-25 MED ORDER — HEPARIN SOD (PORK) LOCK FLUSH 100 UNIT/ML IV SOLN
500.0000 [IU] | Freq: Once | INTRAVENOUS | Status: AC | PRN
  Administered 2018-12-25: 500 [IU]
  Filled 2018-12-25: qty 5

## 2018-12-25 MED ORDER — SODIUM CHLORIDE 0.9% FLUSH
10.0000 mL | INTRAVENOUS | Status: DC | PRN
  Administered 2018-12-25: 10 mL
  Filled 2018-12-25: qty 10

## 2018-12-25 NOTE — Progress Notes (Signed)
5-fu pump dc'd. Site unremarkable. 

## 2019-01-02 ENCOUNTER — Encounter: Payer: Self-pay | Admitting: Endocrinology

## 2019-01-02 ENCOUNTER — Other Ambulatory Visit: Payer: Self-pay

## 2019-01-02 ENCOUNTER — Ambulatory Visit (INDEPENDENT_AMBULATORY_CARE_PROVIDER_SITE_OTHER): Payer: Medicare HMO | Admitting: Endocrinology

## 2019-01-02 VITALS — BP 152/84 | HR 75 | Temp 97.1°F | Wt 169.0 lb

## 2019-01-02 DIAGNOSIS — Z794 Long term (current) use of insulin: Secondary | ICD-10-CM | POA: Diagnosis not present

## 2019-01-02 DIAGNOSIS — E1149 Type 2 diabetes mellitus with other diabetic neurological complication: Secondary | ICD-10-CM | POA: Diagnosis not present

## 2019-01-02 LAB — POCT GLYCOSYLATED HEMOGLOBIN (HGB A1C): Hemoglobin A1C: 8.1 % — AB (ref 4.0–5.6)

## 2019-01-02 MED ORDER — TRESIBA FLEXTOUCH 100 UNIT/ML ~~LOC~~ SOPN: 24.0000 [IU] | PEN_INJECTOR | Freq: Every day | SUBCUTANEOUS | 11 refills | Status: DC

## 2019-01-02 NOTE — Progress Notes (Signed)
Subjective:    Patient ID: Tonya [name], female    DOB: 16-Sep-1940, 78 y.o.   MRN: [id number]  HPI Pt returns for f/u of diabetes mellitus: DM type: Insulin-requiring type 2 (but lean body habitus raises possibility she is evolving type 1).   Dx'ed: 1829 Complications: CVA Therapy: insulin since 2020, and Januvia    GDM: never DKA: never Severe hypoglycemia: never.   Pancreatitis: never Pancreatic imaging: normal on 2010 [place].  Other: she did not tolerate metformin-XR (abd pain); she also took insulin only for a brief time after diagnosis; edema limits rx options.   Interval history: she brings a record of her cbg's which I have reviewed today.  cbg varies from 173-400.  It is in general higher as the day goes on.  Pt now has 2 more chemo txs scheduled.  She takes decadron just 1 day after each chemotx.   Past Medical History:  Diagnosis Date  . Cerebrovascular accident ([place])   . Chronic edema    ? venous insufficiency  . COPD (chronic obstructive pulmonary disease) ([place])    never had a problem breathing  . Diabetes mellitus without complication ([place])   . Enlarged heart    per pt  . Gallstones   . GERD (gastroesophageal reflux disease)   . Goals of care, counseling/discussion 11/01/2018  . Hyperlipidemia   . Hypertension     Past Surgical History:  Procedure Laterality Date  . ABDOMINAL HYSTERECTOMY    . BIOPSY  10/18/2018   Procedure: BIOPSY;  Surgeon: Jackquline Denmark, MD;  Location: [facility] ENDOSCOPY;  Service: Endoscopy;;  . CHOLECYSTECTOMY    . COLONOSCOPY WITH PROPOFOL N/A 10/18/2018   Procedure: COLONOSCOPY WITH PROPOFOL;  Surgeon: Jackquline Denmark, MD;  Location: [facility] ENDOSCOPY;  Service: Endoscopy;  Laterality: N/A;  . IR IMAGING GUIDED PORT INSERTION  11/11/2018  . POLYPECTOMY  10/18/2018   Procedure: POLYPECTOMY;  Surgeon: Jackquline Denmark, MD;  Location: [facility] ENDOSCOPY;  Service: Endoscopy;;  . SUBMUCOSAL TATTOO INJECTION  10/18/2018   Procedure: SUBMUCOSAL TATTOO INJECTION;   Surgeon: Jackquline Denmark, MD;  Location: [facility] ENDOSCOPY;  Service: Endoscopy;;    Social History   Socioeconomic History  . Marital status: Widowed    Spouse name: Not on file  . Number of children: 5  . Years of education: Not on file  . Highest education level: Not on file  Occupational History  . Not on file  Social Needs  . Financial resource strain: Not on file  . Food insecurity    Worry: Not on file    Inability: Not on file  . Transportation needs    Medical: Not on file    Non-medical: Not on file  Tobacco Use  . Smoking status: Former Smoker    Packs/day: 0.25    Years: 1.00    Pack years: 0.25    Quit date: 07/23/1968    Years since quitting: 50.4  . Smokeless tobacco: Never Used  Substance and Sexual Activity  . Alcohol use: No    Alcohol/week: 0.0 standard drinks  . Drug use: No  . Sexual activity: Not Currently  Lifestyle  . Physical activity    Days per week: Not on file    Minutes per session: Not on file  . Stress: Not on file  Relationships  . Social Herbalist on phone: Not on file    Gets together: Not on file    Attends religious service: Not on file    Active  member of club or organization: Not on file    Attends meetings of clubs or organizations: Not on file    Relationship status: Not on file  . Intimate partner violence    Fear of current or ex partner: Not on file    Emotionally abused: Not on file    Physically abused: Not on file    Forced sexual activity: Not on file  Other Topics Concern  . Not on file  Social History Narrative  . Not on file    Current Outpatient Medications on File Prior to Visit  Medication Sig Dispense Refill  . amLODipine (NORVASC) 10 MG tablet Take 1 tablet (10 mg total) by mouth daily. 30 tablet 0  . aspirin EC 81 MG tablet Take 81 mg by mouth daily.    Marland Kitchen atorvastatin (LIPITOR) 10 MG tablet TAKE 1 TABLET(10 MG) BY MOUTH DAILY (Patient taking differently: Take 10 mg by mouth daily. ) 90 tablet 1   . Blood Glucose Monitoring Suppl (ONETOUCH VERIO) w/Device KIT Prick finger 2 times a day to test blood glucose level 1 kit 0  . Blood Pressure Monitoring (SPHYGMOMANOMETER) MISC 1 Units by Does not apply route every morning. 1 each 0  . Cholecalciferol 2000 units TABS Take 1 tablet (2,000 Units total) by mouth daily. 90 tablet 1  . dexamethasone (DECADRON) 4 MG tablet Take 2 tablets (8 mg total) by mouth daily. Start the day after chemotherapy for 2 days. Take with food. 30 tablet 1  . dronabinol (MARINOL) 2.5 MG capsule Take 1 capsule (2.5 mg total) by mouth 2 (two) times daily before a meal. 60 capsule 0  . Insulin Pen Needle (PEN NEEDLES 31GX5/16") 31G X 8 MM MISC 1 Device by Does not apply route daily. 100 each 3  . Lancets (ONETOUCH ULTRASOFT) lancets Ultra soft smooth 33 gauge. Prick finger 2 times a day to test blood glucose level (Patient taking differently: 1 each by Other route 2 (two) times a day. One Touch Verio smooth 33 gauge) 100 each 12  . lidocaine-prilocaine (EMLA) cream Apply to Portacath 1-2 hours prior to chemotherapy or blood draw. 30 g 3  . metoprolol tartrate (LOPRESSOR) 25 MG tablet TAKE 1/2 TABLET(12.5 MG) BY MOUTH TWICE DAILY 60 tablet 1  . ondansetron (ZOFRAN) 8 MG tablet Take 1 tablet (8 mg total) by mouth 2 (two) times daily as needed for refractory nausea / vomiting. Start on day 3 after chemotherapy. 30 tablet 1  . ONETOUCH VERIO test strip 1 each by Other route 3 (three) times daily. USE TO TEST BLOOD SUGAR UP TO QID 100 each 0  . prochlorperazine (COMPAZINE) 10 MG tablet Take 1 tablet (10 mg total) by mouth every 6 (six) hours as needed (Nausea or vomiting). 30 tablet 1  . sitaGLIPtin (JANUVIA) 50 MG tablet Take 1 tablet (50 mg total) by mouth daily. 90 tablet 3  . amLODipine (NORVASC) 5 MG tablet     . hydrochlorothiazide (HYDRODIURIL) 25 MG tablet Take 25 mg by mouth daily.     No current facility-administered medications on file prior to visit.     Allergies   Allergen Reactions  . Iohexol Hives, Shortness Of Breath and Other (See Comments)    Patient developed hives and fullness in throat post injection of 125cc's Omni 300, Onset Date: 11/15/2006   . Hydrocodone Swelling and Other (See Comments)    "I started swelling, became red, and passed out"  . Ibuprofen Nausea Only and Other (See Comments)    "  It made my stomach hurt"  . Naproxen Other (See Comments)    "It made me feel out of my head"  . Propoxyphene N-Acetaminophen Other (See Comments)    "I couldn't find the door to make my way out of the room- I was in misery"    Family History  Problem Relation Age of Onset  . Cirrhosis Mother   . Heart disease Father   . Cancer Other   . Hypertension Other   . Diabetes Other     BP (!) 152/84   Pulse 75   Temp (!) 97.1 F (36.2 C) (Temporal)   Wt 169 lb (76.7 kg)   SpO2 99%   BMI 23.57 kg/m   Review of Systems She denies hypoglycemia    Objective:   Physical Exam VITAL SIGNS:  See vs page GENERAL: no distress Pulses: dorsalis pedis intact bilat.   MSK: no deformity of the feet CV: 1+ left leg edema (pt says due to CVA), but none on the right Skin:  no ulcer on the feet.  normal color and temp on the feet. Neuro: sensation is intact to touch on the feet Ext: there is bilateral onychomycosis of the toenails.    A1c=8.1%  Lab Results  Component Value Date   CREATININE 0.77 12/23/2018   BUN 15 12/23/2018   NA 141 12/23/2018   K 3.7 12/23/2018   CL 105 12/23/2018   CO2 29 12/23/2018       Assessment & Plan:  HTN: is noted today.    Edema: This limits rx options Insulin-requiring type 2 DM, with CVA: worse.    Colon cancer: steroid rx is affecting glycemic control.   Patient Instructions  Your blood pressure is high today.  Please see your primary care provider soon, to have it rechecked Please increase the insulin to 24 units daily.   Please come back for a follow-up appointment in 1 month.  Please continue the  same [place].   check your blood sugar twice a day.  vary the time of day when you check, between before the 3 meals, and at bedtime.  also check if you have symptoms of your blood sugar being too high or too low.  please keep a record of the readings and bring it to your next appointment here (or you can bring the meter itself).  You can write it on any piece of paper.  please call us sooner if your blood sugar goes below 70, or if you have a lot of readings over 200.

## 2019-01-02 NOTE — Patient Instructions (Addendum)
Your blood pressure is high today.  Please see your primary care provider soon, to have it rechecked Please increase the insulin to 24 units daily.   Please come back for a follow-up appointment in 1 month.  Please continue the same [place].   check your blood sugar twice a day.  vary the time of day when you check, between before the 3 meals, and at bedtime.  also check if you have symptoms of your blood sugar being too high or too low.  please keep a record of the readings and bring it to your next appointment here (or you can bring the meter itself).  You can write it on any piece of paper.  please call us sooner if your blood sugar goes below 70, or if you have a lot of readings over 200.

## 2019-01-03 ENCOUNTER — Encounter: Payer: Self-pay | Admitting: *Deleted

## 2019-01-09 ENCOUNTER — Other Ambulatory Visit: Payer: Self-pay

## 2019-01-09 ENCOUNTER — Ambulatory Visit (HOSPITAL_BASED_OUTPATIENT_CLINIC_OR_DEPARTMENT_OTHER)
Admission: RE | Admit: 2019-01-09 | Discharge: 2019-01-09 | Disposition: A | Discharge status: Home / Self Care | Payer: Medicare HMO | Source: Ambulatory Visit | Attending: Hematology & Oncology | Admitting: Hematology & Oncology

## 2019-01-09 DIAGNOSIS — C189 Malignant neoplasm of colon, unspecified: Secondary | ICD-10-CM | POA: Insufficient documentation

## 2019-01-09 DIAGNOSIS — C787 Secondary malignant neoplasm of liver and intrahepatic bile duct: Secondary | ICD-10-CM | POA: Insufficient documentation

## 2019-01-10 ENCOUNTER — Encounter: Payer: Medicare HMO | Attending: Endocrinology | Admitting: Dietician

## 2019-01-10 ENCOUNTER — Other Ambulatory Visit: Payer: Self-pay

## 2019-01-10 ENCOUNTER — Telehealth: Payer: Self-pay | Admitting: *Deleted

## 2019-01-10 DIAGNOSIS — E118 Type 2 diabetes mellitus with unspecified complications: Secondary | ICD-10-CM | POA: Diagnosis not present

## 2019-01-10 DIAGNOSIS — E1149 Type 2 diabetes mellitus with other diabetic neurological complication: Secondary | ICD-10-CM

## 2019-01-10 DIAGNOSIS — C189 Malignant neoplasm of colon, unspecified: Secondary | ICD-10-CM

## 2019-01-10 MED ORDER — SITAGLIPTIN PHOSPHATE 50 MG PO TABS: 50.0000 mg | ORAL_TABLET | Freq: Every day | ORAL | 0 refills | Status: DC

## 2019-01-10 MED ORDER — SITAGLIPTIN PHOSPHATE 50 MG PO TABS: 50.0000 mg | ORAL_TABLET | Freq: Every day | ORAL | 3 refills | Status: DC

## 2019-01-10 NOTE — Patient Instructions (Addendum)
Store your extra insulin in the refrigerator. Be sure to rotate your insulin injection sites.  Be careful with your juice and regular soda intake.  These do contain a lot of carbohydrate which can make your blood sugar rise quickly.  Small amounts of gingerale is fine if you are feeling nauseous.    Continue to drink your water. Continue to eat increased vegetables as you are feeling up to it.  Aim for 3 Carb Choices per meal (45 grams) +/- 1 either way  Aim for 0-1 Carbs per snack if hungry  Include protein in moderation with your meals and snacks Consider reading food labels for Total Carbohydrate of foods Consider  increasing your activity level by armchair exercises or stationary bike for 10 minutes daily as tolerated Consider checking BG at alternate times per day  Consider taking medication  as directed by MD  Increase intake as needed to maintain a healthy weight.

## 2019-01-10 NOTE — Telephone Encounter (Signed)
Note received from telephone advice record stating that pt had dizziness and chills once she got home from CT yesterday.  Call placed to pt.'s daughter [name] to check on pt.'s status.  [name] states that pt is "much better" today and has no complaints.  [name] appreciative of call and would like CT results when they are available.  Dr. Marin Olp notified.

## 2019-01-10 NOTE — Progress Notes (Signed)
Medical Nutrition Therapy:  Appt start time: 1600 end time:  [id number].   Assessment:  Primary concerns today: Patient is here today with her daughter.  They would like to learn more about meal planning with diabetes and how to control her BG better.Marland Kitchen  History includes type 2 diabetes and colon cancer with mets to the liver.  She is currently undergoing chemo.  She goes on Decadron each time she gets chemo (every 2 weeks). Taste alterations- "everything tastes sweet" Other history includes HTN, HLD, GERD, CVA. She checks her BG tid.  Average 260 per patient, recent fasting 130-183 and post meal 186-200's with a high of 484.   Medications include 24 units Tresiba q HS, Januvia Weight 169 lbs stable  Patient lives with her son Edd Arbour.  Her son and daughter have been shopping and cooking for the past 4 months.  Patient states that she was doing this prior to 4 months ago but is now too weak.  Her daughter takes her to all of her appointments and cares for her as needed.  She has been receiving Chemo treatments since July.  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   Preferred Learning Style:   No preference indicated   Learning Readiness:   Ready  Change in progress   DIETARY INTAKE: Everyday foods include vegetables, chicken.  Avoided foods include eggs, milk.    24-hr recall:  B (10:30-11 AM): Glucerna Shake, Banana, tangerine  Snk ( AM):   L (12:30-1PM): chicken, greens or cabbage or green beans, potato salad or noodles or beans Snk ( PM): occasional popsiclel or Glucerna or 1/2 Sunday D ( PM): [place] sandwich, potato salad or greens Snk ( PM): frozen peaches or pears Beverages: water, regular gingerale, coffee with canned milk and 1/2 t sugar  Usual physical activity:  Came in a wheel chair today  Estimated energy needs: 1800-2000 calories 75-85 g protein  Progress Towards Goal(s):  In  progress.   Nutritional Diagnosis:  NB-1.1 Food and nutrition-related knowledge deficit As related to balance of carbohydrates, protein, and fat.  As evidenced by diet hx and patient report.    Intervention:  Nutrition education related to type 2 diabetes with considerations related to cancer treatment.  Discussed importance of continued adequate nutrition, balance of carbohydrate, portion sizes, and label reading.  Discussed beverage intake and impact on BG.  Discussed insulin storage and rotation.  Store your extra insulin in the refrigerator. Be sure to rotate your insulin injection sites.  Be careful with your juice and regular soda intake.  These do contain a lot of carbohydrate which can make your blood sugar rise quickly.  Small amounts of gingerale is fine if you are feeling nauseous.    Continue to drink your water. Continue to eat increased vegetables as you are feeling up to it.  Aim for 3 Carb Choices per meal (45 grams) +/- 1 either way  Aim for 0-1 Carbs per snack if hungry  Include protein in moderation with your meals and snacks Consider reading food labels for Total Carbohydrate of foods Consider  increasing your activity level by armchair exercises or stationary bike for 10 minutes daily as tolerated Consider checking BG at alternate times per day  Consider taking medication  as directed by MD  Increase intake as needed to maintain a healthy weight.   Teaching Method Utilized:  Visual Auditory Hands on  Handouts given during  visit include:  2 week 1500 calorie meal plan with instructions to increase caloric intake as needed to maintain her weight  Meal plan card  Label reading  Barriers to learning/adherence to lifestyle change: none  Demonstrated degree of understanding via:  Teach Back   Monitoring/Evaluation:  Dietary intake, exercise, label reading, and body weight prn.

## 2019-01-13 ENCOUNTER — Other Ambulatory Visit: Payer: Self-pay | Admitting: Hematology & Oncology

## 2019-01-13 NOTE — Progress Notes (Signed)
DISCONTINUE ON PATHWAY REGIMEN - Colorectal     A cycle is every 14 days:     Oxaliplatin      Leucovorin      Fluorouracil      Fluorouracil   **Always confirm dose/schedule in your pharmacy ordering system**  REASON: Disease Progression PRIOR TREATMENT: [id number]: [id number] q14 Days TREATMENT RESPONSE: Progressive Disease (PD)  START ON PATHWAY REGIMEN - Colorectal     A cycle is every 14 days:     Bevacizumab-xxxx      Irinotecan      Leucovorin      Fluorouracil      Fluorouracil   **Always confirm dose/schedule in your pharmacy ordering system**  Patient Characteristics: Distant Metastases, Nonsurgical Candidate, KRAS/NRAS Mutation Positive/Unknown (BRAF V600 Wild-Type/Unknown), Standard Cytotoxic Therapy, Second Line Standard Cytotoxic Therapy, Bevacizumab Eligible Tumor Location: Colon Therapeutic Status: Distant Metastases Microsatellite/Mismatch Repair Status: MSS/pMMR BRAF Mutation Status: Wild-Type (no mutation) KRAS/NRAS Mutation Status: Mutation Positive Standard Cytotoxic Line of Therapy: Second Line Standard Cytotoxic Therapy Bevacizumab Eligibility: Eligible Intent of Therapy: Curative Intent, Discussed with Patient

## 2019-01-14 ENCOUNTER — Telehealth: Payer: Self-pay | Admitting: Hematology & Oncology

## 2019-01-14 NOTE — Telephone Encounter (Signed)
Called and spoke with patient's daughter and advised her that appointment times have changed again.  She stated she just received a call that appts were at 10:00. I advised her that they now had been scheduled for 9:15 and she was ok with this new time.  Appts were moved up due to type of treatment

## 2019-01-20 ENCOUNTER — Ambulatory Visit: Payer: Medicare HMO | Admitting: Family

## 2019-01-20 ENCOUNTER — Inpatient Hospital Stay (HOSPITAL_BASED_OUTPATIENT_CLINIC_OR_DEPARTMENT_OTHER): Payer: Medicare HMO | Admitting: Family

## 2019-01-20 ENCOUNTER — Inpatient Hospital Stay: Payer: Medicare HMO

## 2019-01-20 ENCOUNTER — Other Ambulatory Visit: Payer: Self-pay

## 2019-01-20 ENCOUNTER — Ambulatory Visit: Payer: Medicare HMO

## 2019-01-20 ENCOUNTER — Encounter: Payer: Self-pay | Admitting: Family

## 2019-01-20 ENCOUNTER — Other Ambulatory Visit: Payer: Self-pay | Admitting: Hematology & Oncology

## 2019-01-20 ENCOUNTER — Telehealth: Payer: Self-pay | Admitting: Family

## 2019-01-20 ENCOUNTER — Other Ambulatory Visit: Payer: Medicare HMO

## 2019-01-20 VITALS — BP 148/77 | HR 76 | Temp 97.6°F | Resp 18 | Ht 71.0 in | Wt 167.1 lb

## 2019-01-20 DIAGNOSIS — R42 Dizziness and giddiness: Secondary | ICD-10-CM | POA: Diagnosis not present

## 2019-01-20 DIAGNOSIS — C787 Secondary malignant neoplasm of liver and intrahepatic bile duct: Secondary | ICD-10-CM | POA: Diagnosis not present

## 2019-01-20 DIAGNOSIS — C189 Malignant neoplasm of colon, unspecified: Secondary | ICD-10-CM

## 2019-01-20 DIAGNOSIS — C182 Malignant neoplasm of ascending colon: Secondary | ICD-10-CM | POA: Diagnosis not present

## 2019-01-20 DIAGNOSIS — Z5112 Encounter for antineoplastic immunotherapy: Secondary | ICD-10-CM | POA: Diagnosis not present

## 2019-01-20 DIAGNOSIS — D509 Iron deficiency anemia, unspecified: Secondary | ICD-10-CM | POA: Diagnosis not present

## 2019-01-20 DIAGNOSIS — D500 Iron deficiency anemia secondary to blood loss (chronic): Secondary | ICD-10-CM

## 2019-01-20 DIAGNOSIS — Z79899 Other long term (current) drug therapy: Secondary | ICD-10-CM | POA: Diagnosis not present

## 2019-01-20 DIAGNOSIS — Z5111 Encounter for antineoplastic chemotherapy: Secondary | ICD-10-CM | POA: Diagnosis not present

## 2019-01-20 LAB — CMP (CANCER CENTER ONLY)
ALT: 14 U/L (ref 0–44)
AST: 21 U/L (ref 15–41)
Albumin: 3.6 g/dL (ref 3.5–5.0)
Alkaline Phosphatase: 94 U/L (ref 38–126)
Anion gap: 7 (ref 5–15)
BUN: 13 mg/dL (ref 8–23)
CO2: 29 mmol/L (ref 22–32)
Calcium: 9.7 mg/dL (ref 8.9–10.3)
Chloride: 106 mmol/L (ref 98–111)
Creatinine: 0.7 mg/dL (ref 0.44–1.00)
GFR, Est AFR Am: >60 mL/min (ref >60)
GFR, Estimated: >60 mL/min (ref >60)
Glucose, Bld: 168 mg/dL — ABNORMAL HIGH (ref 70–99)
Potassium: 3.6 mmol/L (ref 3.5–5.1)
Sodium: 142 mmol/L (ref 135–145)
Total Bilirubin: 0.5 mg/dL (ref 0.3–1.2)
Total Protein: 6.2 g/dL — ABNORMAL LOW (ref 6.5–8.1)

## 2019-01-20 LAB — IRON AND TIBC
Iron: 44 ug/dL (ref 41–142)
Saturation Ratios: 17 % — ABNORMAL LOW (ref 21–57)
TIBC: 255 ug/dL (ref 236–444)
UIBC: 211 ug/dL (ref 120–384)

## 2019-01-20 LAB — CBC WITH DIFFERENTIAL (CANCER CENTER ONLY)
Abs Immature Granulocytes: 0.03 10*3/uL (ref 0.00–0.07)
Basophils Absolute: 0 10*3/uL (ref 0.0–0.1)
Basophils Relative: 0 %
Eosinophils Absolute: 0 10*3/uL (ref 0.0–0.5)
Eosinophils Relative: 0 %
HCT: 37.7 % (ref 36.0–46.0)
Hemoglobin: 11.9 g/dL — ABNORMAL LOW (ref 12.0–15.0)
Immature Granulocytes: 1 %
Lymphocytes Relative: 25 %
Lymphs Abs: 1.7 10*3/uL (ref 0.7–4.0)
MCH: 28.3 pg (ref 26.0–34.0)
MCHC: 31.6 g/dL (ref 30.0–36.0)
MCV: 89.8 fL (ref 80.0–100.0)
Monocytes Absolute: 0.6 10*3/uL (ref 0.1–1.0)
Monocytes Relative: 9 %
Neutro Abs: 4.3 10*3/uL (ref 1.7–7.7)
Neutrophils Relative %: 65 %
Platelet Count: 198 10*3/uL (ref 150–400)
RBC: 4.2 MIL/uL (ref 3.87–5.11)
RDW: 15.9 % — ABNORMAL HIGH (ref 11.5–15.5)
WBC Count: 6.6 10*3/uL (ref 4.0–10.5)
nRBC: 0 % (ref 0.0–0.2)

## 2019-01-20 LAB — CEA (IN HOUSE-CHCC): CEA (CHCC-In House): 4.38 ng/mL (ref 0.00–5.00)

## 2019-01-20 LAB — FERRITIN: Ferritin: 323 ng/mL — ABNORMAL HIGH (ref 11–307)

## 2019-01-20 MED ORDER — DEXAMETHASONE SODIUM PHOSPHATE 10 MG/ML IJ SOLN
INTRAMUSCULAR | Status: AC
  Filled 2019-01-20: qty 1

## 2019-01-20 MED ORDER — SODIUM CHLORIDE 0.9 % IV SOLN
Freq: Once | INTRAVENOUS | Status: AC
  Administered 2019-01-20: 11:00:00 via INTRAVENOUS
  Filled 2019-01-20: qty 250

## 2019-01-20 MED ORDER — MECLIZINE HCL 12.5 MG PO TABS: 12.5000 mg | ORAL_TABLET | Freq: Three times a day (TID) | ORAL | 0 refills | Status: DC | PRN

## 2019-01-20 MED ORDER — SODIUM CHLORIDE 0.9 % IV SOLN
5.0000 mg/kg | Freq: Once | INTRAVENOUS | Status: AC
  Administered 2019-01-20: 400 mg via INTRAVENOUS
  Filled 2019-01-20: qty 16

## 2019-01-20 MED ORDER — LOPERAMIDE HCL 2 MG PO TABS: 4.0000 mg | ORAL_TABLET | Freq: Four times a day (QID) | ORAL | 1 refills | Status: DC | PRN

## 2019-01-20 MED ORDER — LEUCOVORIN CALCIUM INJECTION 350 MG
400.0000 mg/m2 | Freq: Once | INTRAVENOUS | Status: AC
  Administered 2019-01-20: 784 mg via INTRAVENOUS
  Filled 2019-01-20: qty 39.2

## 2019-01-20 MED ORDER — PALONOSETRON HCL INJECTION 0.25 MG/5ML
0.2500 mg | Freq: Once | INTRAVENOUS | Status: AC
  Administered 2019-01-20: 0.25 mg via INTRAVENOUS

## 2019-01-20 MED ORDER — DEXAMETHASONE SODIUM PHOSPHATE 10 MG/ML IJ SOLN
10.0000 mg | Freq: Once | INTRAMUSCULAR | Status: AC
  Administered 2019-01-20: 10 mg via INTRAVENOUS

## 2019-01-20 MED ORDER — IRINOTECAN HCL CHEMO INJECTION 100 MG/5ML
135.0000 mg/m2 | Freq: Once | INTRAVENOUS | Status: AC
  Administered 2019-01-20: 260 mg via INTRAVENOUS
  Filled 2019-01-20: qty 4

## 2019-01-20 MED ORDER — PALONOSETRON HCL INJECTION 0.25 MG/5ML
INTRAVENOUS | Status: AC
  Filled 2019-01-20: qty 5

## 2019-01-20 MED ORDER — SODIUM CHLORIDE 0.9 % IV SOLN
1920.0000 mg/m2 | INTRAVENOUS | Status: DC
  Administered 2019-01-20: 3750 mg via INTRAVENOUS
  Filled 2019-01-20: qty 75

## 2019-01-20 MED ORDER — ATROPINE SULFATE 1 MG/ML IJ SOLN: 0.5000 mg | Freq: Once | INTRAMUSCULAR | Status: DC | PRN

## 2019-01-20 MED ORDER — FLUOROURACIL CHEMO INJECTION 2.5 GM/50ML
320.0000 mg/m2 | Freq: Once | INTRAVENOUS | Status: AC
  Administered 2019-01-20: 650 mg via INTRAVENOUS
  Filled 2019-01-20: qty 13

## 2019-01-20 NOTE — Patient Instructions (Signed)
Implanted Port Insertion, Care After This sheet gives you information about how to care for yourself after your procedure. Your health care provider may also give you more specific instructions. If you have problems or questions, contact your health care provider. What can I expect after the procedure? After the procedure, it is common to have:  Discomfort at the port insertion site.  Bruising on the skin over the port. This should improve over 3-4 days. Follow these instructions at home: Port care  After your port is placed, you will get a manufacturer's information card. The card has information about your port. Keep this card with you at all times.  Take care of the port as told by your health care provider. Ask your health care provider if you or a family member can get training for taking care of the port at home. A home health care nurse may also take care of the port.  Make sure to remember what type of port you have. Incision care      Follow instructions from your health care provider about how to take care of your port insertion site. Make sure you: ? Wash your hands with soap and water before and after you change your bandage (dressing). If soap and water are not available, use hand sanitizer. ? Change your dressing as told by your health care provider. ? Leave stitches (sutures), skin glue, or adhesive strips in place. These skin closures may need to stay in place for 2 weeks or longer. If adhesive strip edges start to loosen and curl up, you may trim the loose edges. Do not remove adhesive strips completely unless your health care provider tells you to do that.  Check your port insertion site every day for signs of infection. Check for: ? Redness, swelling, or pain. ? Fluid or blood. ? Warmth. ? Pus or a bad smell. Activity  Return to your normal activities as told by your health care provider. Ask your health care provider what activities are safe for you.  Do not  lift anything that is heavier than 10 lb (4.5 kg), or the limit that you are told, until your health care provider says that it is safe. General instructions  Take over-the-counter and prescription medicines only as told by your health care provider.  Do not take baths, swim, or use a hot tub until your health care provider approves. Ask your health care provider if you may take showers. You may only be allowed to take sponge baths.  Do not drive for 24 hours if you were given a sedative during your procedure.  Wear a medical alert bracelet in case of an emergency. This will tell any health care providers that you have a port.  Keep all follow-up visits as told by your health care provider. This is important. Contact a health care provider if:  You cannot flush your port with saline as directed, or you cannot draw blood from the port.  You have a fever or chills.  You have redness, swelling, or pain around your port insertion site.  You have fluid or blood coming from your port insertion site.  Your port insertion site feels warm to the touch.  You have pus or a bad smell coming from the port insertion site. Get help right away if:  You have chest pain or shortness of breath.  You have bleeding from your port that you cannot control. Summary  Take care of the port as told by your health   care provider. Keep the manufacturer's information card with you at all times.  Change your dressing as told by your health care provider.  Contact a health care provider if you have a fever or chills or if you have redness, swelling, or pain around your port insertion site.  Keep all follow-up visits as told by your health care provider. This information is not intended to replace advice given to you by your health care provider. Make sure you discuss any questions you have with your health care provider. Document Released: 01/29/2013 Document Revised: 11/06/2017 Document Reviewed: 11/06/2017  Elsevier Patient Education  2020 [employer]  

## 2019-01-20 NOTE — Progress Notes (Signed)
Okay to give bevacizumab today per Dr. Marin Olp. Patient had CVA in distant past, but benefits outweigh risks at this time.

## 2019-01-20 NOTE — Patient Instructions (Signed)
[facility] Discharge Instructions for Patients Receiving Chemotherapy  Today you received the following chemotherapy agents Irinotecan, Avastin  To help prevent nausea and vomiting after your treatment, we encourage you to take your nausea medication    If you develop nausea and vomiting that is not controlled by your nausea medication, call the clinic.   BELOW ARE SYMPTOMS THAT SHOULD BE REPORTED IMMEDIATELY:  *FEVER GREATER THAN 100.5 F  *CHILLS WITH OR WITHOUT FEVER  NAUSEA AND VOMITING THAT IS NOT CONTROLLED WITH YOUR NAUSEA MEDICATION  *UNUSUAL SHORTNESS OF BREATH  *UNUSUAL BRUISING OR BLEEDING  TENDERNESS IN MOUTH AND THROAT WITH OR WITHOUT PRESENCE OF ULCERS  *URINARY PROBLEMS  *BOWEL PROBLEMS  UNUSUAL RASH Items with * indicate a potential emergency and should be followed up as soon as possible.  Feel free to call the clinic should you have any questions or concerns. The clinic phone number is [phone].  Please show the [facility] at check-in to the Emergency Department and triage nurse.

## 2019-01-20 NOTE — Telephone Encounter (Signed)
Appointments scheduled calendar printed per 9/28 los / IB message to Dr Marin Olp regarding overbooking appt on 10/12

## 2019-01-20 NOTE — Progress Notes (Signed)
Hematology and Oncology Follow Up Visit  Tonya [name] [id number] 05-10-1940 78 y.o. 01/20/2019   Principle Diagnosis:  Stage IV adenocarcinoma of the ascending colon -- NRAS+, BRAF-, HER2-, MSI/MMR -,  Iron deficiency anemia  Past Therapy: FOLFOX - s/p cycle 4  Current Therapy:  FOLFIRI/Bevacizumab every 2 weeks, started 01/20/2019  IV Iron as indicated   Interim History:  Tonya [name] is here today with her daughter for follow-up and treatment. CT scan 2 weeks ago showed progression of disease in the colon an liver and CEA last month was up to 10.48.  She will now be changing her chemo to FOFIRI/Avastin.  Hgb is stable at 11.9, WBC count is 6.6 and platelets 198.  She is still having episodes of dizziness which she states seem more frequent.  She states that she has a heaviness in her chest at times and cant belch. She feels this may be indigestion.  No fever, chills, n/v, cough, rash, dizziness, SOB, chest pain, palpitations, abdominal pain or changes in bowel or bladder habits.  She has chronic swelling in the left side secondary to stroke. No tenderness, numbness or tingling in her extremities at this time.  No falls or syncopal episodes.  No episodes of bleeding, no bruising or petechiae.  Her appetite is ok and she is hydrating. Her weight is stable.   ECOG Performance Status: 2 - Symptomatic, <50% confined to bed  Medications:  Allergies as of 01/20/2019      Reactions   Iohexol Hives, Shortness Of Breath, Other (See Comments)   Patient developed hives and fullness in throat post injection of 125cc's Omni 300, Onset Date: 11/15/2006   Hydrocodone Swelling, Other (See Comments)   "I started swelling, became red, and passed out"   Ibuprofen Nausea Only, Other (See Comments)   "It made my stomach hurt"   Naproxen Other (See Comments)   "It made me feel out of my head"   Propoxyphene N-acetaminophen Other (See Comments)   "I couldn't find the door to make my way out of the  room- I was in misery"      Medication List       Accurate as of January 20, 2019  9:38 AM. If you have any questions, ask your nurse or doctor.        amLODipine 5 MG tablet Commonly known as: NORVASC   amLODipine 10 MG tablet Commonly known as: NORVASC Take 1 tablet (10 mg total) by mouth daily.   aspirin EC 81 MG tablet Take 81 mg by mouth daily.   atorvastatin 10 MG tablet Commonly known as: LIPITOR TAKE 1 TABLET(10 MG) BY MOUTH DAILY What changed:   how much to take  how to take this  when to take this  additional instructions   Cholecalciferol 50 MCG (2000 UT) Tabs Take 1 tablet (2,000 Units total) by mouth daily.   dronabinol 2.5 MG capsule Commonly known as: MARINOL Take 1 capsule (2.5 mg total) by mouth 2 (two) times daily before a meal.   hydrochlorothiazide 25 MG tablet Commonly known as: HYDRODIURIL Take 25 mg by mouth daily.   metoprolol tartrate 25 MG tablet Commonly known as: LOPRESSOR TAKE 1/2 TABLET(12.5 MG) BY MOUTH TWICE DAILY   onetouch ultrasoft lancets Ultra soft smooth 33 gauge. Prick finger 2 times a day to test blood glucose level What changed:   how much to take  how to take this  when to take this  additional instructions   OneTouch Verio test strip  Generic drug: glucose blood 1 each by Other route 3 (three) times daily. USE TO TEST BLOOD SUGAR UP TO QID   OneTouch Verio w/Device Kit Prick finger 2 times a day to test blood glucose level   PEN NEEDLES 31GX5/16" 31G X 8 MM Misc 1 Device by Does not apply route daily.   sitaGLIPtin 50 MG tablet Commonly known as: JANUVIA Take 1 tablet (50 mg total) by mouth daily.   Sphygmomanometer Misc 1 Units by Does not apply route every morning.   Tyler Aas FlexTouch 100 UNIT/ML Sopn FlexTouch Pen Generic drug: insulin degludec Inject 0.24 mLs (24 Units total) into the skin daily.       Allergies:  Allergies  Allergen Reactions  . Iohexol Hives, Shortness Of Breath  and Other (See Comments)    Patient developed hives and fullness in throat post injection of 125cc's Omni 300, Onset Date: 11/15/2006   . Hydrocodone Swelling and Other (See Comments)    "I started swelling, became red, and passed out"  . Ibuprofen Nausea Only and Other (See Comments)    "It made my stomach hurt"  . Naproxen Other (See Comments)    "It made me feel out of my head"  . Propoxyphene N-Acetaminophen Other (See Comments)    "I couldn't find the door to make my way out of the room- I was in misery"    Past Medical History, Surgical history, Social history, and Family History were reviewed and updated.  Review of Systems: All other 10 point review of systems is negative.   Physical Exam:  vitals were not taken for this visit.   Wt Readings from Last 3 Encounters:  01/10/19 169 lb (76.7 kg)  01/02/19 169 lb (76.7 kg)  12/23/18 166 lb (75.3 kg)    Ocular: Sclerae unicteric, pupils equal, round and reactive to light Ear-nose-throat: Oropharynx clear, dentition fair Lymphatic: No cervical or supraclavicular adenopathy Lungs no rales or rhonchi, good excursion bilaterally Heart regular rate and rhythm, no murmur appreciated Abd soft, nontender, positive bowel sounds, no liver or spleen tip palpated on exam, no fluid wave  MSK no focal spinal tenderness, no joint edema Neuro: non-focal, well-oriented, appropriate affect Breasts: Deferred   Lab Results  Component Value Date   WBC 5.4 12/23/2018   HGB 11.2 (L) 12/23/2018   HCT 36.4 12/23/2018   MCV 84.1 12/23/2018   PLT 107 (L) 12/23/2018   Lab Results  Component Value Date   FERRITIN 626 (H) 12/23/2018   IRON 52 12/23/2018   TIBC 268 12/23/2018   UIBC 215 12/23/2018   IRONPCTSAT 20 (L) 12/23/2018   Lab Results  Component Value Date   RETICCTPCT 1.5 05/17/2016   RBC 4.33 12/23/2018   RETICCTABS 62,400 05/17/2016   No results found for: KPAFRELGTCHN, LAMBDASER, KAPLAMBRATIO No results found for: IGGSERUM,  IGA, IGMSERUM No results found for: Odetta Pink, SPEI   Chemistry      Component Value Date/Time   NA 141 12/23/2018 0850   NA 139 09/30/2018 1311   K 3.7 12/23/2018 0850   CL 105 12/23/2018 0850   CO2 29 12/23/2018 0850   BUN 15 12/23/2018 0850   BUN 10 09/30/2018 1311   CREATININE 0.77 12/23/2018 0850      Component Value Date/Time   CALCIUM 9.3 12/23/2018 0850   ALKPHOS 81 12/23/2018 0850   AST 22 12/23/2018 0850   ALT 20 12/23/2018 0850   BILITOT 0.5 12/23/2018 0850  Impression and Plan: Tonya [name] is a very pleasant 78 yo [place] American female with metastatic colon cancer. With recently progression of disease in the colon and liver and rise in CEA we will now change her to FOLFIRI/Avastin.  We will proceed as planned. Dr. Marin Olp also popped in to say hello. We discussed potential side effects with the next drugs and gave her daughter education material on each one. No other questions at this time.  Prescription for Antivert sent to her [facility] for dizziness/vertigo.  We will see what her iron studies show and give IV iron if needed.  We will see her back in another 2 weeks.  They will contact our office with any questions or concerns. We can certainly see her sooner if needed.   Laverna Peace, NP 9/28/20209:38 AM

## 2019-01-21 ENCOUNTER — Encounter: Payer: Medicare HMO | Admitting: Dietician

## 2019-01-21 ENCOUNTER — Ambulatory Visit: Payer: Medicare HMO | Admitting: Endocrinology

## 2019-01-22 ENCOUNTER — Inpatient Hospital Stay: Payer: Medicare HMO

## 2019-01-22 ENCOUNTER — Other Ambulatory Visit: Payer: Self-pay

## 2019-01-22 ENCOUNTER — Other Ambulatory Visit: Payer: Self-pay | Admitting: Family

## 2019-01-22 VITALS — BP 159/78 | HR 58 | Temp 97.2°F | Resp 17

## 2019-01-22 DIAGNOSIS — D500 Iron deficiency anemia secondary to blood loss (chronic): Secondary | ICD-10-CM

## 2019-01-22 DIAGNOSIS — Z79899 Other long term (current) drug therapy: Secondary | ICD-10-CM | POA: Diagnosis not present

## 2019-01-22 DIAGNOSIS — Z5111 Encounter for antineoplastic chemotherapy: Secondary | ICD-10-CM | POA: Diagnosis not present

## 2019-01-22 DIAGNOSIS — D509 Iron deficiency anemia, unspecified: Secondary | ICD-10-CM | POA: Diagnosis not present

## 2019-01-22 DIAGNOSIS — C189 Malignant neoplasm of colon, unspecified: Secondary | ICD-10-CM

## 2019-01-22 DIAGNOSIS — Z5112 Encounter for antineoplastic immunotherapy: Secondary | ICD-10-CM | POA: Diagnosis not present

## 2019-01-22 DIAGNOSIS — C182 Malignant neoplasm of ascending colon: Secondary | ICD-10-CM | POA: Diagnosis not present

## 2019-01-22 MED ORDER — SODIUM CHLORIDE 0.9 % IV SOLN: 510.0000 mg | Freq: Once | INTRAVENOUS | Status: DC

## 2019-01-22 MED ORDER — SODIUM CHLORIDE 0.9 % IV SOLN: 40.0000 mg | Freq: Once | INTRAVENOUS | Status: DC

## 2019-01-22 MED ORDER — SODIUM CHLORIDE 0.9 % IV SOLN
200.0000 mg | Freq: Once | INTRAVENOUS | Status: AC
  Administered 2019-01-22: 200 mg via INTRAVENOUS
  Filled 2019-01-22: qty 10

## 2019-01-22 MED ORDER — METHYLPREDNISOLONE SODIUM SUCC 125 MG IJ SOLR
INTRAMUSCULAR | Status: AC
  Filled 2019-01-22: qty 2

## 2019-01-22 MED ORDER — SODIUM CHLORIDE 0.9% FLUSH
10.0000 mL | INTRAVENOUS | Status: DC | PRN
  Filled 2019-01-22: qty 10

## 2019-01-22 MED ORDER — METHYLPREDNISOLONE SODIUM SUCC 125 MG IJ SOLR: 125.0000 mg | Freq: Once | INTRAMUSCULAR | Status: DC

## 2019-01-22 MED ORDER — SODIUM CHLORIDE 0.9 % IV SOLN
Freq: Once | INTRAVENOUS | Status: AC
  Administered 2019-01-22: 12:00:00 via INTRAVENOUS
  Filled 2019-01-22: qty 250

## 2019-01-22 MED ORDER — HEPARIN SOD (PORK) LOCK FLUSH 100 UNIT/ML IV SOLN
500.0000 [IU] | Freq: Once | INTRAVENOUS | Status: DC | PRN
  Filled 2019-01-22: qty 5

## 2019-01-22 NOTE — Patient Instructions (Addendum)
Central Line, Adult A central line is a thin, flexible tube (catheter) that is put in your vein. It can be used to:  Give you medicine.  Give you food and nutrients. Follow these instructions at home: Caring for the tube   Follow instructions from your doctor about: ? Flushing the tube with saline solution. ? Cleaning the tube and the area around it.  Only flush with clean (sterile) supplies. The supplies should be from your doctor, a pharmacy, or another place that your doctor recommends.  Before you flush the tube or clean the area around the tube: ? Wash your hands with soap and water. If you cannot use soap and water, use hand sanitizer. ? Clean the central line hub with rubbing alcohol. Caring for your skin  Keep the area where the tube was put in clean and dry.  Every day, and when changing the bandage, check the skin around the central line for: ? Redness, swelling, or pain. ? Fluid or blood. ? Warmth. ? Pus. ? A bad smell. General instructions  Keep the tube clamped, unless it is being used.  Keep your supplies in a clean, dry location.  If you or someone else accidentally pulls on the tube, make sure: ? The bandage (dressing) is okay. ? There is no bleeding. ? The tube has not been pulled out.  Do not use scissors or sharp objects near the tube.  Do not swim or let the tube soak in a tub.  Ask your doctor what activities are safe for you. Your doctor may tell you not to lift anything or move your arm too much.  Take over-the-counter and prescription medicines only as told by your doctor.  Change bandages as told by your doctor.  Keep your bandage dry. If a bandage gets wet, have it changed right away.  Keep all follow-up visits as told by your doctor. This is important. Throwing away supplies  Throw away any syringes in a trash (disposal) container that is only for sharp items (sharps container). You can buy a sharps container from a pharmacy, or you  can make one by using an empty hard plastic bottle with a cover.  Place any used bandages or infusion bags into a plastic bag. Throw that bag in the trash. Contact a doctor if:  You have any of these where the tube was put in: ? Redness, swelling, or pain. ? Fluid or blood. ? A warm feeling. ? Pus or a bad smell. Get help right away if:  You have: ? A fever. ? Chills. ? Trouble getting enough air (shortness of breath). ? Trouble breathing. ? Pain in your chest. ? Swelling in your neck, face, chest, or arm.  You are coughing.  You feel your heart beating fast or skipping beats.  You feel dizzy or you pass out (faint).  There are red lines coming from where the tube was put in.  The area where the tube was put in is bleeding and the bleeding will not stop.  Your tube is hard to flush.  You do not get a blood return from the tube.  The tube gets loose or comes out.  The tube has a hole or a tear.  The tube leaks. Summary  A central line is a thin, flexible tube (catheter) that is put in your vein. It can be used to take blood for lab tests or to give you medicine.  Follow instructions from your doctor about flushing and cleaning the   tube.  Keep the area where the tube was put in clean and dry.  Ask your doctor what activities are safe for you. This information is not intended to replace advice given to you by your health care provider. Make sure you discuss any questions you have with your health care provider. Document Released: 03/27/2012 Document Revised: 07/31/2018 Document Reviewed: 04/27/2016 Elsevier Patient Education  [place]. Ferumoxytol injection What is this medicine? FERUMOXYTOL is an iron complex. Iron is used to make healthy red blood cells, which carry oxygen and nutrients throughout the body. This medicine is used to treat iron deficiency anemia. This medicine may be used for other purposes; ask your health care provider or pharmacist if  you have questions. COMMON BRAND NAME(S): Feraheme What should I tell my health care provider before I take this medicine? They need to know if you have any of these conditions:  anemia not caused by low iron levels  high levels of iron in the blood  magnetic resonance imaging (MRI) test scheduled  an unusual or allergic reaction to iron, other medicines, foods, dyes, or preservatives  pregnant or trying to get pregnant  breast-feeding How should I use this medicine? This medicine is for injection into a vein. It is given by a health care professional in a hospital or clinic setting. Talk to your pediatrician regarding the use of this medicine in children. Special care may be needed. Overdosage: If you think you have taken too much of this medicine contact a poison control center or emergency room at once. NOTE: This medicine is only for you. Do not share this medicine with others. What if I miss a dose? It is important not to miss your dose. Call your doctor or health care professional if you are unable to keep an appointment. What may interact with this medicine? This medicine may interact with the following medications:  other iron products This list may not describe all possible interactions. Give your health care provider a list of all the medicines, herbs, non-prescription drugs, or dietary supplements you use. Also tell them if you smoke, drink alcohol, or use illegal drugs. Some items may interact with your medicine. What should I watch for while using this medicine? Visit your doctor or healthcare professional regularly. Tell your doctor or healthcare professional if your symptoms do not start to get better or if they get worse. You may need blood work done while you are taking this medicine. You may need to follow a special diet. Talk to your doctor. Foods that contain iron include: whole grains/cereals, dried fruits, beans, or peas, leafy green vegetables, and organ meats  (liver, kidney). What side effects may I notice from receiving this medicine? Side effects that you should report to your doctor or health care professional as soon as possible:  allergic reactions like skin rash, itching or hives, swelling of the face, lips, or tongue  breathing problems  changes in blood pressure  feeling faint or lightheaded, falls  fever or chills  flushing, sweating, or hot feelings  swelling of the ankles or feet Side effects that usually do not require medical attention (report to your doctor or health care professional if they continue or are bothersome):  diarrhea  headache  nausea, vomiting  stomach pain This list may not describe all possible side effects. Call your doctor for medical advice about side effects. You may report side effects to FDA at 1-800-FDA-1088. Where should I keep my medicine? This drug is given in  a hospital or clinic and will not be stored at home. NOTE: This sheet is a summary. It may not cover all possible information. If you have questions about this medicine, talk to your doctor, pharmacist, or health care provider.  2020 Elsevier/Gold Standard (2016-05-29 20:21:10)

## 2019-01-28 ENCOUNTER — Encounter: Payer: Self-pay | Admitting: Endocrinology

## 2019-01-28 ENCOUNTER — Ambulatory Visit (INDEPENDENT_AMBULATORY_CARE_PROVIDER_SITE_OTHER): Payer: Medicare HMO | Admitting: Endocrinology

## 2019-01-28 ENCOUNTER — Other Ambulatory Visit: Payer: Self-pay

## 2019-01-28 VITALS — BP 140/80 | HR 78 | Ht 71.0 in | Wt 177.4 lb

## 2019-01-28 DIAGNOSIS — Z794 Long term (current) use of insulin: Secondary | ICD-10-CM

## 2019-01-28 DIAGNOSIS — E1149 Type 2 diabetes mellitus with other diabetic neurological complication: Secondary | ICD-10-CM

## 2019-01-28 MED ORDER — TRESIBA FLEXTOUCH 100 UNIT/ML ~~LOC~~ SOPN: 28.0000 [IU] | PEN_INJECTOR | Freq: Every day | SUBCUTANEOUS | 11 refills | Status: DC

## 2019-01-28 NOTE — Progress Notes (Signed)
Subjective:    Patient ID: Tonya [name], female    DOB: 07-Nov-1940, 78 y.o.   MRN: [id number]  HPI Pt returns for f/u of diabetes mellitus: DM type: Insulin-requiring type 2 (but lean body habitus raises possibility she is evolving type 1).   Dx'ed: 4098 Complications: CVA Therapy: insulin since 2020, and Januvia    GDM: never DKA: never Severe hypoglycemia: never.   Pancreatitis: never Pancreatic imaging: normal on 2010 [place].  Other: she did not tolerate metformin-XR (abd pain); she also took insulin only for a brief time after diagnosis; edema limits rx options.   Interval history: she brings her meter with her cbg's which I have reviewed today.  cbg varies from 124-420.  It is in general higher as the day goes on.  Pt still has 2 more chemo txs scheduled.  She takes decadron just 1 day after each chemotx. Pt says she will run out of [place] soon, and it is very expensive to refill.   She has not recently taken norvasc or olmesartan-HCT Past Medical History:  Diagnosis Date  . Cerebrovascular accident ([place])   . Chronic edema    ? venous insufficiency  . COPD (chronic obstructive pulmonary disease) ([place])    never had a problem breathing  . Diabetes mellitus without complication ([place])   . Enlarged heart    per pt  . Gallstones   . GERD (gastroesophageal reflux disease)   . Goals of care, counseling/discussion 11/01/2018  . Hyperlipidemia   . Hypertension     Past Surgical History:  Procedure Laterality Date  . ABDOMINAL HYSTERECTOMY    . BIOPSY  10/18/2018   Procedure: BIOPSY;  Surgeon: Jackquline Denmark, MD;  Location: [facility] ENDOSCOPY;  Service: Endoscopy;;  . CHOLECYSTECTOMY    . COLONOSCOPY WITH PROPOFOL N/A 10/18/2018   Procedure: COLONOSCOPY WITH PROPOFOL;  Surgeon: Jackquline Denmark, MD;  Location: [facility] ENDOSCOPY;  Service: Endoscopy;  Laterality: N/A;  . IR IMAGING GUIDED PORT INSERTION  11/11/2018  . POLYPECTOMY  10/18/2018   Procedure: POLYPECTOMY;  Surgeon: Jackquline Denmark, MD;   Location: [facility] ENDOSCOPY;  Service: Endoscopy;;  . SUBMUCOSAL TATTOO INJECTION  10/18/2018   Procedure: SUBMUCOSAL TATTOO INJECTION;  Surgeon: Jackquline Denmark, MD;  Location: [facility] ENDOSCOPY;  Service: Endoscopy;;    Social History   Socioeconomic History  . Marital status: Widowed    Spouse name: Not on file  . Number of children: 5  . Years of education: Not on file  . Highest education level: Not on file  Occupational History  . Not on file  Social Needs  . Financial resource strain: Not on file  . Food insecurity    Worry: Not on file    Inability: Not on file  . Transportation needs    Medical: Not on file    Non-medical: Not on file  Tobacco Use  . Smoking status: Former Smoker    Packs/day: 0.25    Years: 1.00    Pack years: 0.25    Quit date: 07/23/1968    Years since quitting: 50.5  . Smokeless tobacco: Never Used  Substance and Sexual Activity  . Alcohol use: No    Alcohol/week: 0.0 standard drinks  . Drug use: No  . Sexual activity: Not Currently  Lifestyle  . Physical activity    Days per week: Not on file    Minutes per session: Not on file  . Stress: Not on file  Relationships  . Social Herbalist on phone:  Not on file    Gets together: Not on file    Attends religious service: Not on file    Active member of club or organization: Not on file    Attends meetings of clubs or organizations: Not on file    Relationship status: Not on file  . Intimate partner violence    Fear of current or ex partner: Not on file    Emotionally abused: Not on file    Physically abused: Not on file    Forced sexual activity: Not on file  Other Topics Concern  . Not on file  Social History Narrative  . Not on file    Current Outpatient Medications on File Prior to Visit  Medication Sig Dispense Refill  . amLODipine (NORVASC) 10 MG tablet Take 1 tablet (10 mg total) by mouth daily. 30 tablet 0  . aspirin EC 81 MG tablet Take 81 mg by mouth daily.    .  atorvastatin (LIPITOR) 10 MG tablet TAKE 1 TABLET(10 MG) BY MOUTH DAILY (Patient taking differently: Take 10 mg by mouth daily. ) 90 tablet 1  . Blood Glucose Monitoring Suppl (ONETOUCH VERIO) w/Device KIT Prick finger 2 times a day to test blood glucose level 1 kit 0  . Blood Pressure Monitoring (SPHYGMOMANOMETER) MISC 1 Units by Does not apply route every morning. 1 each 0  . Cholecalciferol 2000 units TABS Take 1 tablet (2,000 Units total) by mouth daily. 90 tablet 1  . dronabinol (MARINOL) 2.5 MG capsule Take 1 capsule (2.5 mg total) by mouth 2 (two) times daily before a meal. 60 capsule 0  . Insulin Pen Needle (PEN NEEDLES 31GX5/16") 31G X 8 MM MISC 1 Device by Does not apply route daily. 100 each 3  . Lancets (ONETOUCH ULTRASOFT) lancets Ultra soft smooth 33 gauge. Prick finger 2 times a day to test blood glucose level (Patient taking differently: 1 each by Other route 2 (two) times a day. One Touch Verio smooth 33 gauge) 100 each 12  . loperamide (IMODIUM A-D) 2 MG tablet Take 2 tablets (4 mg total) by mouth 4 (four) times daily as needed. Take 2 at diarrhea onset , then 1 every 2hr until 12hrs with no BM. May take 2 every 4hrs at night. If diarrhea recurs repeat. 100 tablet 1  . meclizine (ANTIVERT) 12.5 MG tablet Take 1 tablet (12.5 mg total) by mouth 3 (three) times daily as needed for dizziness. 30 tablet 0  . metoprolol tartrate (LOPRESSOR) 25 MG tablet TAKE 1/2 TABLET(12.5 MG) BY MOUTH TWICE DAILY 60 tablet 1  . ONETOUCH VERIO test strip 1 each by Other route 3 (three) times daily. USE TO TEST BLOOD SUGAR UP TO QID 100 each 0  . sitaGLIPtin (JANUVIA) 50 MG tablet Take 1 tablet (50 mg total) by mouth daily. 90 tablet 3  . [DISCONTINUED] prochlorperazine (COMPAZINE) 10 MG tablet Take 1 tablet (10 mg total) by mouth every 6 (six) hours as needed (Nausea or vomiting). 30 tablet 1   No current facility-administered medications on file prior to visit.     Allergies  Allergen Reactions  .  Hydrocodone Swelling and Other (See Comments)    "I started swelling, became red, and passed out"  . Iohexol Hives, Shortness Of Breath and Other (See Comments)    Patient developed hives and fullness in throat post injection of 125cc's Omni 300, Onset Date: 11/15/2006   . Naproxen Other (See Comments)    "It made me feel out of my head"  .   Ibuprofen Nausea Only  . Propoxyphene N-Acetaminophen Other (See Comments)    "I couldn't find the door to make my way out of the room- I was in misery"    Family History  Problem Relation Age of Onset  . Cirrhosis Mother   . Heart disease Father   . Cancer Other   . Hypertension Other   . Diabetes Other     BP 140/80 (BP Location: Right Arm, Patient Position: Sitting, Cuff Size: Normal)   Pulse 78   Ht 5' 11" (1.803 m)   Wt 177 lb 6.4 oz (80.5 kg)   SpO2 93%   BMI 24.74 kg/m   Review of Systems She denies hypoglycemia    Objective:   Physical Exam VITAL SIGNS:  See vs page GENERAL: no distress.  In wheelchair Pulses: dorsalis pedis intact bilat.   MSK: no deformity of the feet CV: trace bilat leg edema Skin:  no ulcer on the feet.  normal color and temp on the feet. Neuro: sensation is intact to touch on the feet Ext: there is bilateral onychomycosis of the toenails  Lab Results  Component Value Date   CREATININE 0.70 01/20/2019   BUN 13 01/20/2019   NA 142 01/20/2019   K 3.6 01/20/2019   CL 106 01/20/2019   CO2 29 01/20/2019      Assessment & Plan:  Insulin-requiring type 2 DM, with CVA: I told her that syringe and vial from [facility] is cheaper, but she declines to change.  She says she'll continue Tresiba HTN: therapy limited by noncompliance, but BP is OK today on decreased rx.  Colon cancer: in this context, she is not a candidate for aggressive glycemic control.  Patient Instructions  Your blood pressure is high today.  Please see your primary care provider soon, to have it rechecked. You can stay off the amlodipine  and olmesartan-HCTZ for now.   Please increase the Tresiba to 28 units daily.   Please come back for a follow-up appointment in 1 month.   Please continue the same januvia.   check your blood sugar twice a day.  vary the time of day when you check, between before the 3 meals, and at bedtime.  also check if you have symptoms of your blood sugar being too high or too low.  please keep a record of the readings and bring it to your next appointment here (or you can bring the meter itself).  You can write it on any piece of paper.  please call us sooner if your blood sugar goes below 70, or if you have a lot of readings over 200.      

## 2019-01-28 NOTE — Patient Instructions (Addendum)
Your blood pressure is high today.  Please see your primary care provider soon, to have it rechecked. You can stay off the amlodipine and olmesartan-HCTZ for now.   Please increase the Tresiba to 28 units daily.   Please come back for a follow-up appointment in 1 month.   Please continue the same [place].   check your blood sugar twice a day.  vary the time of day when you check, between before the 3 meals, and at bedtime.  also check if you have symptoms of your blood sugar being too high or too low.  please keep a record of the readings and bring it to your next appointment here (or you can bring the meter itself).  You can write it on any piece of paper.  please call us sooner if your blood sugar goes below 70, or if you have a lot of readings over 200.

## 2019-01-29 ENCOUNTER — Telehealth: Payer: Self-pay | Admitting: *Deleted

## 2019-01-29 NOTE — Telephone Encounter (Signed)
Called patient to check on her right lower back pain.  Patient has been taking 1 Aleve with considerable relief.  Told Debra to call us if she doesn't get relief.

## 2019-02-03 ENCOUNTER — Inpatient Hospital Stay: Payer: Medicare HMO

## 2019-02-03 ENCOUNTER — Other Ambulatory Visit: Payer: Self-pay

## 2019-02-03 ENCOUNTER — Other Ambulatory Visit: Payer: Self-pay | Admitting: Family

## 2019-02-03 ENCOUNTER — Inpatient Hospital Stay (HOSPITAL_BASED_OUTPATIENT_CLINIC_OR_DEPARTMENT_OTHER): Payer: Medicare HMO | Admitting: Hematology & Oncology

## 2019-02-03 ENCOUNTER — Inpatient Hospital Stay: Payer: Medicare HMO | Attending: Hematology & Oncology

## 2019-02-03 ENCOUNTER — Encounter: Payer: Self-pay | Admitting: Hematology & Oncology

## 2019-02-03 DIAGNOSIS — C787 Secondary malignant neoplasm of liver and intrahepatic bile duct: Secondary | ICD-10-CM

## 2019-02-03 DIAGNOSIS — R42 Dizziness and giddiness: Secondary | ICD-10-CM | POA: Insufficient documentation

## 2019-02-03 DIAGNOSIS — R531 Weakness: Secondary | ICD-10-CM | POA: Diagnosis not present

## 2019-02-03 DIAGNOSIS — C189 Malignant neoplasm of colon, unspecified: Secondary | ICD-10-CM

## 2019-02-03 DIAGNOSIS — D500 Iron deficiency anemia secondary to blood loss (chronic): Secondary | ICD-10-CM

## 2019-02-03 DIAGNOSIS — Z7982 Long term (current) use of aspirin: Secondary | ICD-10-CM | POA: Diagnosis not present

## 2019-02-03 DIAGNOSIS — Z79899 Other long term (current) drug therapy: Secondary | ICD-10-CM | POA: Insufficient documentation

## 2019-02-03 DIAGNOSIS — D509 Iron deficiency anemia, unspecified: Secondary | ICD-10-CM | POA: Diagnosis not present

## 2019-02-03 DIAGNOSIS — Z5112 Encounter for antineoplastic immunotherapy: Secondary | ICD-10-CM | POA: Insufficient documentation

## 2019-02-03 DIAGNOSIS — Z8673 Personal history of transient ischemic attack (TIA), and cerebral infarction without residual deficits: Secondary | ICD-10-CM | POA: Insufficient documentation

## 2019-02-03 DIAGNOSIS — C182 Malignant neoplasm of ascending colon: Secondary | ICD-10-CM | POA: Insufficient documentation

## 2019-02-03 DIAGNOSIS — Z5111 Encounter for antineoplastic chemotherapy: Secondary | ICD-10-CM | POA: Insufficient documentation

## 2019-02-03 LAB — CBC WITH DIFFERENTIAL (CANCER CENTER ONLY)
Abs Immature Granulocytes: 0.01 10*3/uL (ref 0.00–0.07)
Basophils Absolute: 0 10*3/uL (ref 0.0–0.1)
Basophils Relative: 0 %
Eosinophils Absolute: 0 10*3/uL (ref 0.0–0.5)
Eosinophils Relative: 1 %
HCT: 36.3 % (ref 36.0–46.0)
Hemoglobin: 11.2 g/dL — ABNORMAL LOW (ref 12.0–15.0)
Immature Granulocytes: 0 %
Lymphocytes Relative: 24 %
Lymphs Abs: 1.1 10*3/uL (ref 0.7–4.0)
MCH: 28.7 pg (ref 26.0–34.0)
MCHC: 30.9 g/dL (ref 30.0–36.0)
MCV: 93.1 fL (ref 80.0–100.0)
Monocytes Absolute: 0.4 10*3/uL (ref 0.1–1.0)
Monocytes Relative: 10 %
Neutro Abs: 2.8 10*3/uL (ref 1.7–7.7)
Neutrophils Relative %: 65 %
Platelet Count: 194 10*3/uL (ref 150–400)
RBC: 3.9 MIL/uL (ref 3.87–5.11)
RDW: 14.3 % (ref 11.5–15.5)
WBC Count: 4.4 10*3/uL (ref 4.0–10.5)
nRBC: 0 % (ref 0.0–0.2)

## 2019-02-03 LAB — CMP (CANCER CENTER ONLY)
ALT: 15 U/L (ref 0–44)
AST: 19 U/L (ref 15–41)
Albumin: 3.6 g/dL (ref 3.5–5.0)
Alkaline Phosphatase: 80 U/L (ref 38–126)
Anion gap: 6 (ref 5–15)
BUN: 15 mg/dL (ref 8–23)
CO2: 29 mmol/L (ref 22–32)
Calcium: 9.5 mg/dL (ref 8.9–10.3)
Chloride: 107 mmol/L (ref 98–111)
Creatinine: 0.71 mg/dL (ref 0.44–1.00)
GFR, Est AFR Am: >60 mL/min (ref >60)
GFR, Estimated: >60 mL/min (ref >60)
Glucose, Bld: 160 mg/dL — ABNORMAL HIGH (ref 70–99)
Potassium: 4.2 mmol/L (ref 3.5–5.1)
Sodium: 142 mmol/L (ref 135–145)
Total Bilirubin: 0.6 mg/dL (ref 0.3–1.2)
Total Protein: 5.9 g/dL — ABNORMAL LOW (ref 6.5–8.1)

## 2019-02-03 LAB — FERRITIN: Ferritin: 344 ng/mL — ABNORMAL HIGH (ref 11–307)

## 2019-02-03 LAB — IRON AND TIBC
Iron: 51 ug/dL (ref 41–142)
Saturation Ratios: 18 % — ABNORMAL LOW (ref 21–57)
TIBC: 288 ug/dL (ref 236–444)
UIBC: 237 ug/dL (ref 120–384)

## 2019-02-03 MED ORDER — SODIUM CHLORIDE 0.9 % IV SOLN
Freq: Once | INTRAVENOUS | Status: AC
  Administered 2019-02-03: 11:00:00 via INTRAVENOUS
  Filled 2019-02-03: qty 250

## 2019-02-03 MED ORDER — ALTEPLASE 2 MG IJ SOLR
2.0000 mg | Freq: Once | INTRAMUSCULAR | Status: DC | PRN
  Filled 2019-02-03: qty 2

## 2019-02-03 MED ORDER — PALONOSETRON HCL INJECTION 0.25 MG/5ML
0.2500 mg | Freq: Once | INTRAVENOUS | Status: AC
  Administered 2019-02-03: 0.25 mg via INTRAVENOUS

## 2019-02-03 MED ORDER — SODIUM CHLORIDE 0.9 % IV SOLN
135.0000 mg/m2 | Freq: Once | INTRAVENOUS | Status: AC
  Administered 2019-02-03: 260 mg via INTRAVENOUS
  Filled 2019-02-03: qty 2

## 2019-02-03 MED ORDER — HEPARIN SOD (PORK) LOCK FLUSH 100 UNIT/ML IV SOLN
250.0000 [IU] | Freq: Once | INTRAVENOUS | Status: DC | PRN
  Filled 2019-02-03: qty 5

## 2019-02-03 MED ORDER — SODIUM CHLORIDE 0.9 % IV SOLN
1920.0000 mg/m2 | INTRAVENOUS | Status: DC
  Administered 2019-02-03: 3750 mg via INTRAVENOUS
  Filled 2019-02-03: qty 75

## 2019-02-03 MED ORDER — SODIUM CHLORIDE 0.9 % IV SOLN
400.0000 mg/m2 | Freq: Once | INTRAVENOUS | Status: AC
  Administered 2019-02-03: 784 mg via INTRAVENOUS
  Filled 2019-02-03: qty 39.2

## 2019-02-03 MED ORDER — HEPARIN SOD (PORK) LOCK FLUSH 100 UNIT/ML IV SOLN
500.0000 [IU] | Freq: Once | INTRAVENOUS | Status: DC | PRN
  Filled 2019-02-03: qty 5

## 2019-02-03 MED ORDER — SODIUM CHLORIDE 0.9% FLUSH
10.0000 mL | INTRAVENOUS | Status: DC | PRN
  Filled 2019-02-03: qty 10

## 2019-02-03 MED ORDER — DEXAMETHASONE SODIUM PHOSPHATE 10 MG/ML IJ SOLN
INTRAMUSCULAR | Status: AC
  Filled 2019-02-03: qty 1

## 2019-02-03 MED ORDER — ATROPINE SULFATE 1 MG/ML IJ SOLN: 0.5000 mg | Freq: Once | INTRAMUSCULAR | Status: DC | PRN

## 2019-02-03 MED ORDER — LEUCOVORIN CALCIUM INJECTION 350 MG: 400.0000 mg/m2 | Freq: Once | INTRAVENOUS | Status: DC

## 2019-02-03 MED ORDER — PALONOSETRON HCL INJECTION 0.25 MG/5ML
INTRAVENOUS | Status: AC
  Filled 2019-02-03: qty 5

## 2019-02-03 MED ORDER — DEXAMETHASONE SODIUM PHOSPHATE 10 MG/ML IJ SOLN
10.0000 mg | Freq: Once | INTRAMUSCULAR | Status: AC
  Administered 2019-02-03: 10 mg via INTRAVENOUS

## 2019-02-03 MED ORDER — SODIUM CHLORIDE 0.9% FLUSH
3.0000 mL | INTRAVENOUS | Status: DC | PRN
  Filled 2019-02-03: qty 10

## 2019-02-03 MED ORDER — FLUOROURACIL CHEMO INJECTION 2.5 GM/50ML
320.0000 mg/m2 | Freq: Once | INTRAVENOUS | Status: AC
  Administered 2019-02-03: 650 mg via INTRAVENOUS
  Filled 2019-02-03: qty 13

## 2019-02-03 MED ORDER — SODIUM CHLORIDE 0.9 % IV SOLN
5.0000 mg/kg | Freq: Once | INTRAVENOUS | Status: AC
  Administered 2019-02-03: 400 mg via INTRAVENOUS
  Filled 2019-02-03: qty 16

## 2019-02-03 MED ORDER — CYCLOBENZAPRINE HCL 7.5 MG PO TABS: 7.5000 mg | ORAL_TABLET | Freq: Three times a day (TID) | ORAL | 2 refills | Status: DC | PRN

## 2019-02-03 MED ORDER — SODIUM CHLORIDE 0.9 % IV SOLN
Freq: Once | INTRAVENOUS | Status: AC
  Administered 2019-02-03: 12:00:00 via INTRAVENOUS
  Filled 2019-02-03: qty 250

## 2019-02-03 NOTE — Patient Instructions (Signed)

## 2019-02-03 NOTE — Patient Instructions (Signed)
Bevacizumab injection What is this medicine? BEVACIZUMAB (be va SIZ yoo mab) is a monoclonal antibody. It is used to treat many types of cancer. This medicine may be used for other purposes; ask your health care provider or pharmacist if you have questions. COMMON BRAND NAME(S): Avastin, MVASI, Zirabev What should I tell my health care provider before I take this medicine? They need to know if you have any of these conditions:  diabetes  heart disease  high blood pressure  history of coughing up blood  prior anthracycline chemotherapy (e.g., doxorubicin, daunorubicin, epirubicin)  recent or ongoing radiation therapy  recent or planning to have surgery  stroke  an unusual or allergic reaction to bevacizumab, hamster proteins, mouse proteins, other medicines, foods, dyes, or preservatives  pregnant or trying to get pregnant  breast-feeding How should I use this medicine? This medicine is for infusion into a vein. It is given by a health care professional in a hospital or clinic setting. Talk to your pediatrician regarding the use of this medicine in children. Special care may be needed. Overdosage: If you think you have taken too much of this medicine contact a poison control center or emergency room at once. NOTE: This medicine is only for you. Do not share this medicine with others. What if I miss a dose? It is important not to miss your dose. Call your doctor or health care professional if you are unable to keep an appointment. What may interact with this medicine? Interactions are not expected. This list may not describe all possible interactions. Give your health care provider a list of all the medicines, herbs, non-prescription drugs, or dietary supplements you use. Also tell them if you smoke, drink alcohol, or use illegal drugs. Some items may interact with your medicine. What should I watch for while using this medicine? Your condition will be monitored carefully while  you are receiving this medicine. You will need important blood work and urine testing done while you are taking this medicine. This medicine may increase your risk to bruise or bleed. Call your doctor or health care professional if you notice any unusual bleeding. This medicine should be started at least 28 days following major surgery and the site of the surgery should be totally healed. Check with your doctor before scheduling dental work or surgery while you are receiving this treatment. Talk to your doctor if you have recently had surgery or if you have a wound that has not healed. Do not become pregnant while taking this medicine or for 6 months after stopping it. Women should inform their doctor if they wish to become pregnant or think they might be pregnant. There is a potential for serious side effects to an unborn child. Talk to your health care professional or pharmacist for more information. Do not breast-feed an infant while taking this medicine and for 6 months after the last dose. This medicine has caused ovarian failure in some women. This medicine may interfere with the ability to have a child. You should talk to your doctor or health care professional if you are concerned about your fertility. What side effects may I notice from receiving this medicine? Side effects that you should report to your doctor or health care professional as soon as possible:  allergic reactions like skin rash, itching or hives, swelling of the face, lips, or tongue  chest pain or chest tightness  chills  coughing up blood  high fever  seizures  severe constipation  signs and symptoms   of bleeding such as bloody or black, tarry stools; red or dark-brown urine; spitting up blood or brown material that looks like coffee grounds; red spots on the skin; unusual bruising or bleeding from the eye, gums, or nose  signs and symptoms of a blood clot such as breathing problems; chest pain; severe, sudden  headache; pain, swelling, warmth in the leg  signs and symptoms of a stroke like changes in vision; confusion; trouble speaking or understanding; severe headaches; sudden numbness or weakness of the face, arm or leg; trouble walking; dizziness; loss of balance or coordination  stomach pain  sweating  swelling of legs or ankles  vomiting  weight gain Side effects that usually do not require medical attention (report to your doctor or health care professional if they continue or are bothersome):  back pain  changes in taste  decreased appetite  dry skin  nausea  tiredness This list may not describe all possible side effects. Call your doctor for medical advice about side effects. You may report side effects to FDA at 1-800-FDA-1088. Where should I keep my medicine? This drug is given in a hospital or clinic and will not be stored at home. NOTE: This sheet is a summary. It may not cover all possible information. If you have questions about this medicine, talk to your doctor, pharmacist, or health care provider.  2020 Elsevier/Gold Standard (2016-04-07 14:33:29) Leucovorin injection What is this medicine? LEUCOVORIN (loo koe VOR in) is used to prevent or treat the harmful effects of some medicines. This medicine is used to treat anemia caused by a low amount of folic acid in the body. It is also used with 5-fluorouracil (5-FU) to treat colon cancer. This medicine may be used for other purposes; ask your health care provider or pharmacist if you have questions. What should I tell my health care provider before I take this medicine? They need to know if you have any of these conditions:  anemia from low levels of vitamin B-12 in the blood  an unusual or allergic reaction to leucovorin, folic acid, other medicines, foods, dyes, or preservatives  pregnant or trying to get pregnant  breast-feeding How should I use this medicine? This medicine is for injection into a muscle or  into a vein. It is given by a health care professional in a hospital or clinic setting. Talk to your pediatrician regarding the use of this medicine in children. Special care may be needed. Overdosage: If you think you have taken too much of this medicine contact a poison control center or emergency room at once. NOTE: This medicine is only for you. Do not share this medicine with others. What if I miss a dose? This does not apply. What may interact with this medicine?  capecitabine  fluorouracil  phenobarbital  phenytoin  primidone  trimethoprim-sulfamethoxazole This list may not describe all possible interactions. Give your health care provider a list of all the medicines, herbs, non-prescription drugs, or dietary supplements you use. Also tell them if you smoke, drink alcohol, or use illegal drugs. Some items may interact with your medicine. What should I watch for while using this medicine? Your condition will be monitored carefully while you are receiving this medicine. This medicine may increase the side effects of 5-fluorouracil, 5-FU. Tell your doctor or health care professional if you have diarrhea or mouth sores that do not get better or that get worse. What side effects may I notice from receiving this medicine? Side effects that you should report  to your doctor or health care professional as soon as possible:  allergic reactions like skin rash, itching or hives, swelling of the face, lips, or tongue  breathing problems  fever, infection  mouth sores  unusual bleeding or bruising  unusually weak or tired Side effects that usually do not require medical attention (report to your doctor or health care professional if they continue or are bothersome):  constipation or diarrhea  loss of appetite  nausea, vomiting This list may not describe all possible side effects. Call your doctor for medical advice about side effects. You may report side effects to FDA at  1-800-FDA-1088. Where should I keep my medicine? This drug is given in a hospital or clinic and will not be stored at home. NOTE: This sheet is a summary. It may not cover all possible information. If you have questions about this medicine, talk to your doctor, pharmacist, or health care provider.  2020 Elsevier/Gold Standard (2007-10-15 16:50:29) Irinotecan injection What is this medicine? IRINOTECAN (ir in oh TEE kan ) is a chemotherapy drug. It is used to treat colon and rectal cancer. This medicine may be used for other purposes; ask your health care provider or pharmacist if you have questions. COMMON BRAND NAME(S): Camptosar What should I tell my health care provider before I take this medicine? They need to know if you have any of these conditions:  dehydration  diarrhea  infection (especially a virus infection such as chickenpox, cold sores, or herpes)  liver disease  low blood counts, like low white cell, platelet, or red cell counts  low levels of calcium, magnesium, or potassium in the blood  recent or ongoing radiation therapy  an unusual or allergic reaction to irinotecan, other medicines, foods, dyes, or preservatives  pregnant or trying to get pregnant  breast-feeding How should I use this medicine? This drug is given as an infusion into a vein. It is administered in a hospital or clinic by a specially trained health care professional. Talk to your pediatrician regarding the use of this medicine in children. Special care may be needed. Overdosage: If you think you have taken too much of this medicine contact a poison control center or emergency room at once. NOTE: This medicine is only for you. Do not share this medicine with others. What if I miss a dose? It is important not to miss your dose. Call your doctor or health care professional if you are unable to keep an appointment. What may interact with this medicine? This medicine may interact with the  following medications:  antiviral medicines for HIV or AIDS  certain antibiotics like rifampin or rifabutin  certain medicines for fungal infections like itraconazole, ketoconazole, posaconazole, and voriconazole  certain medicines for seizures like carbamazepine, phenobarbital, phenotoin  clarithromycin  gemfibrozil  nefazodone  St. John's Wort This list may not describe all possible interactions. Give your health care provider a list of all the medicines, herbs, non-prescription drugs, or dietary supplements you use. Also tell them if you smoke, drink alcohol, or use illegal drugs. Some items may interact with your medicine. What should I watch for while using this medicine? Your condition will be monitored carefully while you are receiving this medicine. You will need important blood work done while you are taking this medicine. This drug may make you feel generally unwell. This is not uncommon, as chemotherapy can affect healthy cells as well as cancer cells. Report any side effects. Continue your course of treatment even though you feel  ill unless your doctor tells you to stop. In some cases, you may be given additional medicines to help with side effects. Follow all directions for their use. You may get drowsy or dizzy. Do not drive, use machinery, or do anything that needs mental alertness until you know how this medicine affects you. Do not stand or sit up quickly, especially if you are an older patient. This reduces the risk of dizzy or fainting spells. Call your health care professional for advice if you get a fever, chills, or sore throat, or other symptoms of a cold or flu. Do not treat yourself. This medicine decreases your body's ability to fight infections. Try to avoid being around people who are sick. Avoid taking products that contain aspirin, acetaminophen, ibuprofen, naproxen, or ketoprofen unless instructed by your doctor. These medicines may hide a fever. This medicine  may increase your risk to bruise or bleed. Call your doctor or health care professional if you notice any unusual bleeding. Be careful brushing and flossing your teeth or using a toothpick because you may get an infection or bleed more easily. If you have any dental work done, tell your dentist you are receiving this medicine. Do not become pregnant while taking this medicine or for 6 months after stopping it. Women should inform their health care professional if they wish to become pregnant or think they might be pregnant. Men should not father a child while taking this medicine and for 3 months after stopping it. There is potential for serious side effects to an unborn child. Talk to your health care professional for more information. Do not breast-feed an infant while taking this medicine or for 7 days after stopping it. This medicine has caused ovarian failure in some women. This medicine may make it more difficult to get pregnant. Talk to your health care professional if you are concerned about your fertility. This medicine has caused decreased sperm counts in some men. This may make it more difficult to father a child. Talk to your health care professional if you are concerned about your fertility. What side effects may I notice from receiving this medicine? Side effects that you should report to your doctor or health care professional as soon as possible:  allergic reactions like skin rash, itching or hives, swelling of the face, lips, or tongue  chest pain  diarrhea  flushing, runny nose, sweating during infusion  low blood counts - this medicine may decrease the number of white blood cells, red blood cells and platelets. You may be at increased risk for infections and bleeding.  nausea, vomiting  pain, swelling, warmth in the leg  signs of decreased platelets or bleeding - bruising, pinpoint red spots on the skin, black, tarry stools, blood in the urine  signs of infection - fever  or chills, cough, sore throat, pain or difficulty passing urine  signs of decreased red blood cells - unusually weak or tired, fainting spells, lightheadedness Side effects that usually do not require medical attention (report to your doctor or health care professional if they continue or are bothersome):  constipation  hair loss  headache  loss of appetite  mouth sores  stomach pain This list may not describe all possible side effects. Call your doctor for medical advice about side effects. You may report side effects to FDA at 1-800-FDA-1088. Where should I keep my medicine? This drug is given in a hospital or clinic and will not be stored at home. NOTE: This sheet is a  summary. It may not cover all possible information. If you have questions about this medicine, talk to your doctor, pharmacist, or health care provider.  2020 Elsevier/Gold Standard (2018-05-31 10:09:17)

## 2019-02-03 NOTE — Progress Notes (Signed)
Hematology and Oncology Follow Up Visit  Tonya [name] [id number] [birth year] 78 y.o. 02/03/2019   Principle Diagnosis:  Stage IV adenocarcinoma of the ascending colon -- NRAS+, BRAF-, HER2-, MSI/MMR -,  Iron deficiency anemia  Past Therapy: FOLFOX - s/p cycle 4  Current Therapy:  FOLFIRI/Bevacizumab-- s/p cycle #1 - started 01/20/2019  IV Iron as indicated   Interim History:  Tonya [name] is here today with her daughter for follow-up and treatment.  She seemed to tolerate the first cycle of chemotherapy okay.  She is having some pain over on her right side.  This seems to be musculoskeletal.  It seems to hurt more when she gets in or out of bed.  I will try her on some Flexeril (7.5 mg p.o. every 8 hours as needed) let us see if this cannot help.  She is eating quite well.  Her weight is gone up a little bit.  She has had no problems with mouth sores.  She does have a couple little sores at the corner of her mouth.  I told her to try some over-the-counter B complex vitamins.  She has had no diarrhea.  There has been no bleeding.  She has had no leg swelling.  We had to give her IV iron the last time she was here.  I think this will be a ongoing issue with her.  Overall, her performance status is ECOG 1.  Medications:  Allergies as of 02/03/2019      Reactions   Hydrocodone Swelling, Other (See Comments)   "I started swelling, became red, and passed out"   Iohexol Hives, Shortness Of Breath, Other (See Comments)   Patient developed hives and fullness in throat post injection of 125cc's Omni 300, Onset Date: 11/15/2006   Naproxen Other (See Comments)   "It made me feel out of my head"   Ibuprofen Nausea Only   Propoxyphene N-acetaminophen Other (See Comments)   "I couldn't find the door to make my way out of the room- I was in misery"      Medication List       Accurate as of February 03, 2019 10:45 AM. If you have any questions, ask your nurse or doctor.        STOP  taking these medications   onetouch ultrasoft lancets Stopped by: [name], MD   OneTouch Verio test strip Generic drug: glucose blood Stopped by: [name], MD   OneTouch Verio w/Device Kit Stopped by: [name], MD   PEN NEEDLES 31GX5/16" 31G X 8 MM Misc Stopped by: [name], MD   Sphygmomanometer Misc Stopped by: [name], MD     TAKE these medications   amLODipine 10 MG tablet Commonly known as: NORVASC Take 1 tablet (10 mg total) by mouth daily.   aspirin EC 81 MG tablet Take 81 mg by mouth daily.   atorvastatin 10 MG tablet Commonly known as: LIPITOR TAKE 1 TABLET(10 MG) BY MOUTH DAILY What changed:   how much to take  how to take this  when to take this  additional instructions   Cholecalciferol 50 MCG (2000 UT) Tabs Take 1 tablet (2,000 Units total) by mouth daily.   dronabinol 2.5 MG capsule Commonly known as: MARINOL Take 1 capsule (2.5 mg total) by mouth 2 (two) times daily before a meal.   loperamide 2 MG tablet Commonly known as: Imodium A-D Take 2 tablets (4 mg total) by mouth 4 (four) times daily as  needed. Take 2 at diarrhea onset , then 1 every 2hr until 12hrs with no BM. May take 2 every 4hrs at night. If diarrhea recurs repeat.   meclizine 12.5 MG tablet Commonly known as: ANTIVERT Take 1 tablet (12.5 mg total) by mouth 3 (three) times daily as needed for dizziness.   metoprolol tartrate 25 MG tablet Commonly known as: LOPRESSOR TAKE 1/2 TABLET(12.5 MG) BY MOUTH TWICE DAILY   sitaGLIPtin 50 MG tablet Commonly known as: JANUVIA Take 1 tablet (50 mg total) by mouth daily.   Tyler Aas FlexTouch 100 UNIT/ML Sopn FlexTouch Pen Generic drug: insulin degludec Inject 0.28 mLs (28 Units total) into the skin daily.       Allergies:  Allergies  Allergen Reactions   Hydrocodone Swelling and Other (See Comments)    "I started swelling, became red, and passed out"   Iohexol Hives, Shortness Of Breath and  Other (See Comments)    Patient developed hives and fullness in throat post injection of 125cc's Omni 300, Onset Date: 11/15/2006    Naproxen Other (See Comments)    "It made me feel out of my head"   Ibuprofen Nausea Only   Propoxyphene N-Acetaminophen Other (See Comments)    "I couldn't find the door to make my way out of the room- I was in misery"    Past Medical History, Surgical history, Social history, and Family History were reviewed and updated.  Review of Systems: Review of Systems  Constitutional: Positive for malaise/fatigue.  HENT: Negative.   Eyes: Negative.   Respiratory: Negative.   Cardiovascular: Negative.   Gastrointestinal: Negative.   Genitourinary: Negative.   Musculoskeletal: Negative.   Skin: Negative.   Neurological: Positive for focal weakness.  Endo/Heme/Allergies: Negative.   Psychiatric/Behavioral: Negative.      Physical Exam:  vitals were not taken for this visit.   Wt Readings from Last 3 Encounters:  02/03/19 177 lb (80.3 kg)  01/28/19 177 lb 6.4 oz (80.5 kg)  01/20/19 167 lb 1.9 oz (75.8 kg)    Physical Exam Vitals signs reviewed.  HENT:     Head: Normocephalic and atraumatic.  Eyes:     Pupils: Pupils are equal, round, and reactive to light.  Neck:     Musculoskeletal: Normal range of motion.  Cardiovascular:     Rate and Rhythm: Normal rate and regular rhythm.     Heart sounds: Normal heart sounds.  Pulmonary:     Effort: Pulmonary effort is normal.     Breath sounds: Normal breath sounds.  Abdominal:     General: Bowel sounds are normal.     Palpations: Abdomen is soft.  Musculoskeletal: Normal range of motion.        General: No tenderness or deformity.  Lymphadenopathy:     Cervical: No cervical adenopathy.  Skin:    General: Skin is warm and dry.     Findings: No erythema or rash.  Neurological:     Mental Status: She is alert and oriented to person, place, and time.     Comments: She has chronic weakness on her  left side.  Psychiatric:        Behavior: Behavior normal.        Thought Content: Thought content normal.        Judgment: Judgment normal.      Lab Results  Component Value Date   WBC 4.4 02/03/2019   HGB 11.2 (L) 02/03/2019   HCT 36.3 02/03/2019   MCV 93.1 02/03/2019   PLT  194 02/03/2019   Lab Results  Component Value Date   FERRITIN 323 (H) 01/20/2019   IRON 44 01/20/2019   TIBC 255 01/20/2019   UIBC 211 01/20/2019   IRONPCTSAT 17 (L) 01/20/2019   Lab Results  Component Value Date   RETICCTPCT 1.5 05/17/2016   RBC 3.90 02/03/2019   RETICCTABS 62,400 05/17/2016   No results found for: KPAFRELGTCHN, LAMBDASER, KAPLAMBRATIO No results found for: IGGSERUM, IGA, IGMSERUM No results found for: Odetta Pink, SPEI   Chemistry      Component Value Date/Time   NA 142 02/03/2019 0940   NA 139 09/30/2018 1311   K 4.2 02/03/2019 0940   CL 107 02/03/2019 0940   CO2 29 02/03/2019 0940   BUN 15 02/03/2019 0940   BUN 10 09/30/2018 1311   CREATININE 0.71 02/03/2019 0940      Component Value Date/Time   CALCIUM 9.5 02/03/2019 0940   ALKPHOS 80 02/03/2019 0940   AST 19 02/03/2019 0940   ALT 15 02/03/2019 0940   BILITOT 0.6 02/03/2019 0940       Impression and Plan: Tonya [name] is a very pleasant 78 yo African American female with metastatic colon cancer.   Hopefully, we will see a response.  I will give her 4 cycles of FOLFIRI/Avastin and then we will rescan her.  Hopefully, the Flexeril will help with his discomfort over on her right side.  Her CVA symptoms have not worsened.  She still has a weakness on her left side.  This is all about quality of life.  Hopefully, we will see that her quality of life continues to improve.  We will get her back in another couple weeks.  If she is having issues, we might want to switch her intervals to every 3 weeks.    [name], MD 10/12/202010:45 AM

## 2019-02-05 ENCOUNTER — Other Ambulatory Visit: Payer: Self-pay | Admitting: *Deleted

## 2019-02-05 ENCOUNTER — Other Ambulatory Visit: Payer: Self-pay

## 2019-02-05 ENCOUNTER — Inpatient Hospital Stay: Payer: Medicare HMO

## 2019-02-05 VITALS — BP 178/71 | HR 52 | Temp 97.3°F | Resp 17

## 2019-02-05 DIAGNOSIS — D500 Iron deficiency anemia secondary to blood loss (chronic): Secondary | ICD-10-CM

## 2019-02-05 DIAGNOSIS — Z5112 Encounter for antineoplastic immunotherapy: Secondary | ICD-10-CM | POA: Diagnosis not present

## 2019-02-05 DIAGNOSIS — R531 Weakness: Secondary | ICD-10-CM | POA: Diagnosis not present

## 2019-02-05 DIAGNOSIS — Z5111 Encounter for antineoplastic chemotherapy: Secondary | ICD-10-CM | POA: Diagnosis not present

## 2019-02-05 DIAGNOSIS — R42 Dizziness and giddiness: Secondary | ICD-10-CM | POA: Diagnosis not present

## 2019-02-05 DIAGNOSIS — C182 Malignant neoplasm of ascending colon: Secondary | ICD-10-CM | POA: Diagnosis not present

## 2019-02-05 DIAGNOSIS — Z8673 Personal history of transient ischemic attack (TIA), and cerebral infarction without residual deficits: Secondary | ICD-10-CM | POA: Diagnosis not present

## 2019-02-05 DIAGNOSIS — Z79899 Other long term (current) drug therapy: Secondary | ICD-10-CM | POA: Diagnosis not present

## 2019-02-05 DIAGNOSIS — D509 Iron deficiency anemia, unspecified: Secondary | ICD-10-CM | POA: Diagnosis not present

## 2019-02-05 DIAGNOSIS — Z7982 Long term (current) use of aspirin: Secondary | ICD-10-CM | POA: Diagnosis not present

## 2019-02-05 MED ORDER — BACLOFEN 10 MG PO TABS: 5.0000 mg | ORAL_TABLET | Freq: Three times a day (TID) | ORAL | 0 refills | Status: DC | PRN

## 2019-02-05 MED ORDER — SODIUM CHLORIDE 0.9 % IV SOLN
Freq: Once | INTRAVENOUS | Status: AC
  Administered 2019-02-05: 13:00:00 via INTRAVENOUS
  Filled 2019-02-05: qty 250

## 2019-02-05 MED ORDER — SODIUM CHLORIDE 0.9 % IV SOLN
200.0000 mg | Freq: Once | INTRAVENOUS | Status: DC
  Administered 2019-02-05: 200 mg via INTRAVENOUS
  Filled 2019-02-05: qty 10

## 2019-02-05 MED ORDER — SODIUM CHLORIDE 0.9% FLUSH
10.0000 mL | Freq: Once | INTRAVENOUS | Status: AC | PRN
  Administered 2019-02-05: 10 mL
  Filled 2019-02-05: qty 10

## 2019-02-05 MED ORDER — SODIUM CHLORIDE 0.9 % IV SOLN
200.0000 mg | Freq: Once | INTRAVENOUS | Status: DC
  Filled 2019-02-05: qty 10

## 2019-02-05 MED ORDER — HEPARIN SOD (PORK) LOCK FLUSH 100 UNIT/ML IV SOLN
500.0000 [IU] | Freq: Once | INTRAVENOUS | Status: AC | PRN
  Administered 2019-02-05: 500 [IU]
  Filled 2019-02-05: qty 5

## 2019-02-05 NOTE — Patient Instructions (Signed)

## 2019-02-17 ENCOUNTER — Inpatient Hospital Stay: Payer: Medicare HMO

## 2019-02-17 ENCOUNTER — Other Ambulatory Visit: Payer: Self-pay

## 2019-02-17 ENCOUNTER — Inpatient Hospital Stay (HOSPITAL_BASED_OUTPATIENT_CLINIC_OR_DEPARTMENT_OTHER): Payer: Medicare HMO | Admitting: Family

## 2019-02-17 VITALS — BP 155/85 | HR 67 | Temp 97.1°F | Resp 18

## 2019-02-17 DIAGNOSIS — C189 Malignant neoplasm of colon, unspecified: Secondary | ICD-10-CM | POA: Diagnosis not present

## 2019-02-17 DIAGNOSIS — C787 Secondary malignant neoplasm of liver and intrahepatic bile duct: Secondary | ICD-10-CM

## 2019-02-17 DIAGNOSIS — D509 Iron deficiency anemia, unspecified: Secondary | ICD-10-CM | POA: Diagnosis not present

## 2019-02-17 DIAGNOSIS — Z5111 Encounter for antineoplastic chemotherapy: Secondary | ICD-10-CM | POA: Diagnosis not present

## 2019-02-17 DIAGNOSIS — Z5112 Encounter for antineoplastic immunotherapy: Secondary | ICD-10-CM | POA: Diagnosis not present

## 2019-02-17 DIAGNOSIS — R531 Weakness: Secondary | ICD-10-CM | POA: Diagnosis not present

## 2019-02-17 DIAGNOSIS — R42 Dizziness and giddiness: Secondary | ICD-10-CM | POA: Diagnosis not present

## 2019-02-17 DIAGNOSIS — Z8673 Personal history of transient ischemic attack (TIA), and cerebral infarction without residual deficits: Secondary | ICD-10-CM | POA: Diagnosis not present

## 2019-02-17 DIAGNOSIS — D500 Iron deficiency anemia secondary to blood loss (chronic): Secondary | ICD-10-CM | POA: Diagnosis not present

## 2019-02-17 DIAGNOSIS — C187 Malignant neoplasm of sigmoid colon: Secondary | ICD-10-CM | POA: Diagnosis not present

## 2019-02-17 DIAGNOSIS — Z7982 Long term (current) use of aspirin: Secondary | ICD-10-CM | POA: Diagnosis not present

## 2019-02-17 DIAGNOSIS — Z79899 Other long term (current) drug therapy: Secondary | ICD-10-CM | POA: Diagnosis not present

## 2019-02-17 DIAGNOSIS — C182 Malignant neoplasm of ascending colon: Secondary | ICD-10-CM | POA: Diagnosis not present

## 2019-02-17 LAB — IRON AND TIBC
Iron: 65 ug/dL (ref 41–142)
Saturation Ratios: 21 % (ref 21–57)
TIBC: 305 ug/dL (ref 236–444)
UIBC: 240 ug/dL (ref 120–384)

## 2019-02-17 LAB — CMP (CANCER CENTER ONLY)
ALT: 13 U/L (ref 0–44)
AST: 19 U/L (ref 15–41)
Albumin: 4.2 g/dL (ref 3.5–5.0)
Alkaline Phosphatase: 77 U/L (ref 38–126)
Anion gap: 7 (ref 5–15)
BUN: 12 mg/dL (ref 8–23)
CO2: 29 mmol/L (ref 22–32)
Calcium: 9.7 mg/dL (ref 8.9–10.3)
Chloride: 104 mmol/L (ref 98–111)
Creatinine: 0.78 mg/dL (ref 0.44–1.00)
GFR, Est AFR Am: >60 mL/min (ref >60)
GFR, Estimated: >60 mL/min (ref >60)
Glucose, Bld: 156 mg/dL — ABNORMAL HIGH (ref 70–99)
Potassium: 4.2 mmol/L (ref 3.5–5.1)
Sodium: 140 mmol/L (ref 135–145)
Total Bilirubin: 0.6 mg/dL (ref 0.3–1.2)
Total Protein: 6.3 g/dL — ABNORMAL LOW (ref 6.5–8.1)

## 2019-02-17 LAB — TOTAL PROTEIN, URINE DIPSTICK: Protein, ur: NEGATIVE mg/dL

## 2019-02-17 LAB — CBC WITH DIFFERENTIAL (CANCER CENTER ONLY)
Abs Immature Granulocytes: 0.02 10*3/uL (ref 0.00–0.07)
Basophils Absolute: 0 10*3/uL (ref 0.0–0.1)
Basophils Relative: 0 %
Eosinophils Absolute: 0 10*3/uL (ref 0.0–0.5)
Eosinophils Relative: 1 %
HCT: 38.9 % (ref 36.0–46.0)
Hemoglobin: 12 g/dL (ref 12.0–15.0)
Immature Granulocytes: 1 %
Lymphocytes Relative: 33 %
Lymphs Abs: 1.4 10*3/uL (ref 0.7–4.0)
MCH: 29.1 pg (ref 26.0–34.0)
MCHC: 30.8 g/dL (ref 30.0–36.0)
MCV: 94.4 fL (ref 80.0–100.0)
Monocytes Absolute: 0.5 10*3/uL (ref 0.1–1.0)
Monocytes Relative: 11 %
Neutro Abs: 2.3 10*3/uL (ref 1.7–7.7)
Neutrophils Relative %: 54 %
Platelet Count: 190 10*3/uL (ref 150–400)
RBC: 4.12 MIL/uL (ref 3.87–5.11)
RDW: 14.1 % (ref 11.5–15.5)
WBC Count: 4.1 10*3/uL (ref 4.0–10.5)
nRBC: 0 % (ref 0.0–0.2)

## 2019-02-17 LAB — CEA (IN HOUSE-CHCC): CEA (CHCC-In House): 2.81 ng/mL (ref 0.00–5.00)

## 2019-02-17 LAB — FERRITIN: Ferritin: 325 ng/mL — ABNORMAL HIGH (ref 11–307)

## 2019-02-17 MED ORDER — HEPARIN SOD (PORK) LOCK FLUSH 100 UNIT/ML IV SOLN
500.0000 [IU] | Freq: Once | INTRAVENOUS | Status: DC | PRN
  Filled 2019-02-17: qty 5

## 2019-02-17 MED ORDER — DEXAMETHASONE SODIUM PHOSPHATE 10 MG/ML IJ SOLN
10.0000 mg | Freq: Once | INTRAMUSCULAR | Status: AC
  Administered 2019-02-17: 10 mg via INTRAVENOUS

## 2019-02-17 MED ORDER — SODIUM CHLORIDE 0.9% FLUSH
10.0000 mL | INTRAVENOUS | Status: DC | PRN
  Filled 2019-02-17: qty 10

## 2019-02-17 MED ORDER — SODIUM CHLORIDE 0.9 % IV SOLN
135.0000 mg/m2 | Freq: Once | INTRAVENOUS | Status: AC
  Administered 2019-02-17: 260 mg via INTRAVENOUS
  Filled 2019-02-17: qty 13

## 2019-02-17 MED ORDER — FLUOROURACIL CHEMO INJECTION 2.5 GM/50ML
320.0000 mg/m2 | Freq: Once | INTRAVENOUS | Status: AC
  Administered 2019-02-17: 15:00:00 650 mg via INTRAVENOUS
  Filled 2019-02-17: qty 13

## 2019-02-17 MED ORDER — SODIUM CHLORIDE 0.9 % IV SOLN
Freq: Once | INTRAVENOUS | Status: AC
  Administered 2019-02-17: 13:00:00 via INTRAVENOUS
  Filled 2019-02-17: qty 250

## 2019-02-17 MED ORDER — DEXAMETHASONE SODIUM PHOSPHATE 10 MG/ML IJ SOLN
INTRAMUSCULAR | Status: AC
  Filled 2019-02-17: qty 1

## 2019-02-17 MED ORDER — SODIUM CHLORIDE 0.9 % IV SOLN
Freq: Once | INTRAVENOUS | Status: AC
  Administered 2019-02-17: 12:00:00 via INTRAVENOUS
  Filled 2019-02-17: qty 250

## 2019-02-17 MED ORDER — ATROPINE SULFATE 1 MG/ML IJ SOLN: 0.5000 mg | Freq: Once | INTRAMUSCULAR | Status: DC | PRN

## 2019-02-17 MED ORDER — SODIUM CHLORIDE 0.9 % IV SOLN
5.0000 mg/kg | Freq: Once | INTRAVENOUS | Status: AC
  Administered 2019-02-17: 13:00:00 400 mg via INTRAVENOUS
  Filled 2019-02-17: qty 16

## 2019-02-17 MED ORDER — SODIUM CHLORIDE 0.9 % IV SOLN
1920.0000 mg/m2 | INTRAVENOUS | Status: DC
  Administered 2019-02-17: 15:00:00 3750 mg via INTRAVENOUS
  Filled 2019-02-17: qty 75

## 2019-02-17 MED ORDER — SODIUM CHLORIDE 0.9 % IV SOLN
400.0000 mg/m2 | Freq: Once | INTRAVENOUS | Status: AC
  Administered 2019-02-17: 784 mg via INTRAVENOUS
  Filled 2019-02-17: qty 39.2

## 2019-02-17 MED ORDER — PALONOSETRON HCL INJECTION 0.25 MG/5ML
INTRAVENOUS | Status: AC
  Filled 2019-02-17: qty 5

## 2019-02-17 MED ORDER — PALONOSETRON HCL INJECTION 0.25 MG/5ML
0.2500 mg | Freq: Once | INTRAVENOUS | Status: AC
  Administered 2019-02-17: 12:00:00 0.25 mg via INTRAVENOUS

## 2019-02-17 NOTE — Progress Notes (Signed)
Hematology and Oncology Follow Up Visit  SHACONDA HAJDUK [id number] 1940/06/20 78 y.o. 02/17/2019   Principle Diagnosis:  Stage IV adenocarcinoma of the ascending colon -- NRAS+, BRAF-, HER2-, MSI/MMR -,  Iron deficiency anemia  Past Therapy: FOLFOX - s/p cycle 4  Current Therapy:   FOLFIRI/Bevacizumab - started 01/20/2019, s/p cycle 2     IV Iron as indicated   Interim History:  Ms. Tolles is here today with her daughter for follow-up and treatment. She notes that her fatigue has improved.  CEA is down to 2.81. Iron studies stable.  She has occasional episodes of dizziness.  She still has twinges of pain in the right side. She has been taking Baclofen and states that she didn't fill the cyclobenzaprine due to cost.  No fever, chills, n/v, cough, rash, headache, SOB, oral sores, chest pain, palpitations, abdominal pain or changes in bowel or bladder habits.  No bleeding. No bruising or petechiae.  She has chronic swelling, numbness and tingling in the left side of her body residual from previous stroke.  No falls or syncopal episodes.  She has maintained a good appetite and is staying hydrated. Her weight has been stable.   ECOG Performance Status: 1 - Symptomatic but completely ambulatory  Medications:  Allergies as of 02/17/2019      Reactions   Hydrocodone Swelling, Other (See Comments)   "I started swelling, became red, and passed out"   Iohexol Hives, Shortness Of Breath, Other (See Comments)   Patient developed hives and fullness in throat post injection of 125cc's Omni 300, Onset Date: 11/15/2006   Naproxen Other (See Comments)   "It made me feel out of my head"   Ibuprofen Nausea Only   Propoxyphene N-acetaminophen Other (See Comments)   "I couldn't find the door to make my way out of the room- I was in misery"      Medication List       Accurate as of February 17, 2019 10:58 AM. If you have any questions, ask your nurse or doctor.        amLODipine 10 MG  tablet Commonly known as: NORVASC Take 1 tablet (10 mg total) by mouth daily.   aspirin EC 81 MG tablet Take 81 mg by mouth daily.   atorvastatin 10 MG tablet Commonly known as: LIPITOR TAKE 1 TABLET(10 MG) BY MOUTH DAILY What changed:   how much to take  how to take this  when to take this  additional instructions   baclofen 10 MG tablet Commonly known as: LIORESAL Take 0.5 tablets (5 mg total) by mouth every 8 (eight) hours as needed for muscle spasms.   Cholecalciferol 50 MCG (2000 UT) Tabs Take 1 tablet (2,000 Units total) by mouth daily.   cyclobenzaprine 7.5 MG tablet Commonly known as: FEXMID Take 1 tablet (7.5 mg total) by mouth 3 (three) times daily as needed for muscle spasms.   dronabinol 2.5 MG capsule Commonly known as: MARINOL Take 1 capsule (2.5 mg total) by mouth 2 (two) times daily before a meal.   loperamide 2 MG tablet Commonly known as: Imodium A-D Take 2 tablets (4 mg total) by mouth 4 (four) times daily as needed. Take 2 at diarrhea onset , then 1 every 2hr until 12hrs with no BM. May take 2 every 4hrs at night. If diarrhea recurs repeat.   meclizine 12.5 MG tablet Commonly known as: ANTIVERT Take 1 tablet (12.5 mg total) by mouth 3 (three) times daily as needed for dizziness.  metoprolol tartrate 25 MG tablet Commonly known as: LOPRESSOR TAKE 1/2 TABLET(12.5 MG) BY MOUTH TWICE DAILY   sitaGLIPtin 50 MG tablet Commonly known as: JANUVIA Take 1 tablet (50 mg total) by mouth daily.   Tyler Aas FlexTouch 100 UNIT/ML Sopn FlexTouch Pen Generic drug: insulin degludec Inject 0.28 mLs (28 Units total) into the skin daily.       Allergies:  Allergies  Allergen Reactions  . Hydrocodone Swelling and Other (See Comments)    "I started swelling, became red, and passed out"  . Iohexol Hives, Shortness Of Breath and Other (See Comments)    Patient developed hives and fullness in throat post injection of 125cc's Omni 300, Onset Date: 11/15/2006    . Naproxen Other (See Comments)    "It made me feel out of my head"  . Ibuprofen Nausea Only  . Propoxyphene N-Acetaminophen Other (See Comments)    "I couldn't find the door to make my way out of the room- I was in misery"    Past Medical History, Surgical history, Social history, and Family History were reviewed and updated.  Review of Systems: All other 10 point review of systems is negative.   Physical Exam:  vitals were not taken for this visit.   Wt Readings from Last 3 Encounters:  02/03/19 177 lb (80.3 kg)  01/28/19 177 lb 6.4 oz (80.5 kg)  01/20/19 167 lb 1.9 oz (75.8 kg)    Ocular: Sclerae unicteric, pupils equal, round and reactive to light Ear-nose-throat: Oropharynx clear, dentition fair Lymphatic: No cervical or supraclavicular adenopathy Lungs no rales or rhonchi, good excursion bilaterally Heart regular rate and rhythm, no murmur appreciated Abd soft, nontender, positive bowel sounds, no liver or spleen tip palpated on exam, no fluid wave  MSK no focal spinal tenderness, no joint edema Neuro: non-focal, well-oriented, appropriate affect Breasts: Deferred   Lab Results  Component Value Date   WBC 4.1 02/17/2019   HGB 12.0 02/17/2019   HCT 38.9 02/17/2019   MCV 94.4 02/17/2019   PLT 190 02/17/2019   Lab Results  Component Value Date   FERRITIN 344 (H) 02/03/2019   IRON 51 02/03/2019   TIBC 288 02/03/2019   UIBC 237 02/03/2019   IRONPCTSAT 18 (L) 02/03/2019   Lab Results  Component Value Date   RETICCTPCT 1.5 05/17/2016   RBC 4.12 02/17/2019   RETICCTABS 62,400 05/17/2016   No results found for: KPAFRELGTCHN, LAMBDASER, KAPLAMBRATIO No results found for: IGGSERUM, IGA, IGMSERUM No results found for: Odetta Pink, SPEI   Chemistry      Component Value Date/Time   NA 142 02/03/2019 0940   NA 139 09/30/2018 1311   K 4.2 02/03/2019 0940   CL 107 02/03/2019 0940   CO2 29 02/03/2019 0940    BUN 15 02/03/2019 0940   BUN 10 09/30/2018 1311   CREATININE 0.71 02/03/2019 0940      Component Value Date/Time   CALCIUM 9.5 02/03/2019 0940   ALKPHOS 80 02/03/2019 0940   AST 19 02/03/2019 0940   ALT 15 02/03/2019 0940   BILITOT 0.6 02/03/2019 0940       Impression and Plan: Ms. Saathoff is a very pleasant 78 yo African American female with metastatic colon cancer.  We will proceed with cycle 3 today as planned.  We will plan to repeat a PET scan after cycle 4.  We will plan to see her back in another 2 weeks. I went of today's lab work with she  and her daughter in detail and they verbalized understanding  They will contact our office with any questions or concerns. We can certainly see her sooner if needed.  Laverna Peace, NP 10/26/202010:58 AM

## 2019-02-17 NOTE — Patient Instructions (Signed)
Irinotecan injection What is this medicine? IRINOTECAN (ir in oh TEE kan ) is a chemotherapy drug. It is used to treat colon and rectal cancer. This medicine may be used for other purposes; ask your health care provider or pharmacist if you have questions. COMMON BRAND NAME(S): Camptosar What should I tell my health care provider before I take this medicine? They need to know if you have any of these conditions:  dehydration  diarrhea  infection (especially a virus infection such as chickenpox, cold sores, or herpes)  liver disease  low blood counts, like low white cell, platelet, or red cell counts  low levels of calcium, magnesium, or potassium in the blood  recent or ongoing radiation therapy  an unusual or allergic reaction to irinotecan, other medicines, foods, dyes, or preservatives  pregnant or trying to get pregnant  breast-feeding How should I use this medicine? This drug is given as an infusion into a vein. It is administered in a hospital or clinic by a specially trained health care professional. Talk to your pediatrician regarding the use of this medicine in children. Special care may be needed. Overdosage: If you think you have taken too much of this medicine contact a poison control center or emergency room at once. NOTE: This medicine is only for you. Do not share this medicine with others. What if I miss a dose? It is important not to miss your dose. Call your doctor or health care professional if you are unable to keep an appointment. What may interact with this medicine? This medicine may interact with the following medications:  antiviral medicines for HIV or AIDS  certain antibiotics like rifampin or rifabutin  certain medicines for fungal infections like itraconazole, ketoconazole, posaconazole, and voriconazole  certain medicines for seizures like carbamazepine, phenobarbital, phenotoin  clarithromycin  gemfibrozil  nefazodone  [facility] John's  Wort This list may not describe all possible interactions. Give your health care provider a list of all the medicines, herbs, non-prescription drugs, or dietary supplements you use. Also tell them if you smoke, drink alcohol, or use illegal drugs. Some items may interact with your medicine. What should I watch for while using this medicine? Your condition will be monitored carefully while you are receiving this medicine. You will need important blood work done while you are taking this medicine. This drug may make you feel generally unwell. This is not uncommon, as chemotherapy can affect healthy cells as well as cancer cells. Report any side effects. Continue your course of treatment even though you feel ill unless your doctor tells you to stop. In some cases, you may be given additional medicines to help with side effects. Follow all directions for their use. You may get drowsy or dizzy. Do not drive, use machinery, or do anything that needs mental alertness until you know how this medicine affects you. Do not stand or sit up quickly, especially if you are an older patient. This reduces the risk of dizzy or fainting spells. Call your health care professional for advice if you get a fever, chills, or sore throat, or other symptoms of a cold or flu. Do not treat yourself. This medicine decreases your body's ability to fight infections. Try to avoid being around people who are sick. Avoid taking products that contain aspirin, acetaminophen, ibuprofen, naproxen, or ketoprofen unless instructed by your doctor. These medicines may hide a fever. This medicine may increase your risk to bruise or bleed. Call your doctor or health care professional if you notice  any unusual bleeding. Be careful brushing and flossing your teeth or using a toothpick because you may get an infection or bleed more easily. If you have any dental work done, tell your dentist you are receiving this medicine. Do not become pregnant while  taking this medicine or for 6 months after stopping it. Women should inform their health care professional if they wish to become pregnant or think they might be pregnant. Men should not father a child while taking this medicine and for 3 months after stopping it. There is potential for serious side effects to an unborn child. Talk to your health care professional for more information. Do not breast-feed an infant while taking this medicine or for 7 days after stopping it. This medicine has caused ovarian failure in some women. This medicine may make it more difficult to get pregnant. Talk to your health care professional if you are concerned about your fertility. This medicine has caused decreased sperm counts in some men. This may make it more difficult to father a child. Talk to your health care professional if you are concerned about your fertility. What side effects may I notice from receiving this medicine? Side effects that you should report to your doctor or health care professional as soon as possible:  allergic reactions like skin rash, itching or hives, swelling of the face, lips, or tongue  chest pain  diarrhea  flushing, runny nose, sweating during infusion  low blood counts - this medicine may decrease the number of white blood cells, red blood cells and platelets. You may be at increased risk for infections and bleeding.  nausea, vomiting  pain, swelling, warmth in the leg  signs of decreased platelets or bleeding - bruising, pinpoint red spots on the skin, black, tarry stools, blood in the urine  signs of infection - fever or chills, cough, sore throat, pain or difficulty passing urine  signs of decreased red blood cells - unusually weak or tired, fainting spells, lightheadedness Side effects that usually do not require medical attention (report to your doctor or health care professional if they continue or are bothersome):  constipation  hair loss  headache  loss of  appetite  mouth sores  stomach pain This list may not describe all possible side effects. Call your doctor for medical advice about side effects. You may report side effects to FDA at 1-800-FDA-1088. Where should I keep my medicine? This drug is given in a hospital or clinic and will not be stored at home. NOTE: This sheet is a summary. It may not cover all possible information. If you have questions about this medicine, talk to your doctor, pharmacist, or health care provider.  2020 Elsevier/Gold Standard (2018-05-31 10:09:17) Leucovorin injection What is this medicine? LEUCOVORIN (loo koe VOR in) is used to prevent or treat the harmful effects of some medicines. This medicine is used to treat anemia caused by a low amount of folic acid in the body. It is also used with 5-fluorouracil (5-FU) to treat colon cancer. This medicine may be used for other purposes; ask your health care provider or pharmacist if you have questions. What should I tell my health care provider before I take this medicine? They need to know if you have any of these conditions:  anemia from low levels of vitamin B-12 in the blood  an unusual or allergic reaction to leucovorin, folic acid, other medicines, foods, dyes, or preservatives  pregnant or trying to get pregnant  breast-feeding How should I use  this medicine? This medicine is for injection into a muscle or into a vein. It is given by a health care professional in a hospital or clinic setting. Talk to your pediatrician regarding the use of this medicine in children. Special care may be needed. Overdosage: If you think you have taken too much of this medicine contact a poison control center or emergency room at once. NOTE: This medicine is only for you. Do not share this medicine with others. What if I miss a dose? This does not apply. What may interact with this  medicine?  capecitabine  fluorouracil  phenobarbital  phenytoin  primidone  trimethoprim-sulfamethoxazole This list may not describe all possible interactions. Give your health care provider a list of all the medicines, herbs, non-prescription drugs, or dietary supplements you use. Also tell them if you smoke, drink alcohol, or use illegal drugs. Some items may interact with your medicine. What should I watch for while using this medicine? Your condition will be monitored carefully while you are receiving this medicine. This medicine may increase the side effects of 5-fluorouracil, 5-FU. Tell your doctor or health care professional if you have diarrhea or mouth sores that do not get better or that get worse. What side effects may I notice from receiving this medicine? Side effects that you should report to your doctor or health care professional as soon as possible:  allergic reactions like skin rash, itching or hives, swelling of the face, lips, or tongue  breathing problems  fever, infection  mouth sores  unusual bleeding or bruising  unusually weak or tired Side effects that usually do not require medical attention (report to your doctor or health care professional if they continue or are bothersome):  constipation or diarrhea  loss of appetite  nausea, vomiting This list may not describe all possible side effects. Call your doctor for medical advice about side effects. You may report side effects to FDA at 1-800-FDA-1088. Where should I keep my medicine? This drug is given in a hospital or clinic and will not be stored at home. NOTE: This sheet is a summary. It may not cover all possible information. If you have questions about this medicine, talk to your doctor, pharmacist, or health care provider.  2020 Elsevier/Gold Standard (2007-10-15 16:50:29) Fluorouracil, 5-FU injection What is this medicine? FLUOROURACIL, 5-FU (flure oh YOOR a sil) is a chemotherapy drug. It  slows the growth of cancer cells. This medicine is used to treat many types of cancer like breast cancer, colon or rectal cancer, pancreatic cancer, and stomach cancer. This medicine may be used for other purposes; ask your health care provider or pharmacist if you have questions. COMMON BRAND NAME(S): Adrucil What should I tell my health care provider before I take this medicine? They need to know if you have any of these conditions:  blood disorders  dihydropyrimidine dehydrogenase (DPD) deficiency  infection (especially a virus infection such as chickenpox, cold sores, or herpes)  kidney disease  liver disease  malnourished, poor nutrition  recent or ongoing radiation therapy  an unusual or allergic reaction to fluorouracil, other chemotherapy, other medicines, foods, dyes, or preservatives  pregnant or trying to get pregnant  breast-feeding How should I use this medicine? This drug is given as an infusion or injection into a vein. It is administered in a hospital or clinic by a specially trained health care professional. Talk to your pediatrician regarding the use of this medicine in children. Special care may be needed. Overdosage: If you  think you have taken too much of this medicine contact a poison control center or emergency room at once. NOTE: This medicine is only for you. Do not share this medicine with others. What if I miss a dose? It is important not to miss your dose. Call your doctor or health care professional if you are unable to keep an appointment. What may interact with this medicine?  allopurinol  cimetidine  dapsone  digoxin  hydroxyurea  leucovorin  levamisole  medicines for seizures like ethotoin, fosphenytoin, phenytoin  medicines to increase blood counts like filgrastim, pegfilgrastim, sargramostim  medicines that treat or prevent blood clots like warfarin, enoxaparin, and  dalteparin  methotrexate  metronidazole  pyrimethamine  some other chemotherapy drugs like busulfan, cisplatin, estramustine, vinblastine  trimethoprim  trimetrexate  vaccines Talk to your doctor or health care professional before taking any of these medicines:  acetaminophen  aspirin  ibuprofen  ketoprofen  naproxen This list may not describe all possible interactions. Give your health care provider a list of all the medicines, herbs, non-prescription drugs, or dietary supplements you use. Also tell them if you smoke, drink alcohol, or use illegal drugs. Some items may interact with your medicine. What should I watch for while using this medicine? Visit your doctor for checks on your progress. This drug may make you feel generally unwell. This is not uncommon, as chemotherapy can affect healthy cells as well as cancer cells. Report any side effects. Continue your course of treatment even though you feel ill unless your doctor tells you to stop. In some cases, you may be given additional medicines to help with side effects. Follow all directions for their use. Call your doctor or health care professional for advice if you get a fever, chills or sore throat, or other symptoms of a cold or flu. Do not treat yourself. This drug decreases your body's ability to fight infections. Try to avoid being around people who are sick. This medicine may increase your risk to bruise or bleed. Call your doctor or health care professional if you notice any unusual bleeding. Be careful brushing and flossing your teeth or using a toothpick because you may get an infection or bleed more easily. If you have any dental work done, tell your dentist you are receiving this medicine. Avoid taking products that contain aspirin, acetaminophen, ibuprofen, naproxen, or ketoprofen unless instructed by your doctor. These medicines may hide a fever. Do not become pregnant while taking this medicine. Women should  inform their doctor if they wish to become pregnant or think they might be pregnant. There is a potential for serious side effects to an unborn child. Talk to your health care professional or pharmacist for more information. Do not breast-feed an infant while taking this medicine. Men should inform their doctor if they wish to father a child. This medicine may lower sperm counts. Do not treat diarrhea with over the counter products. Contact your doctor if you have diarrhea that lasts more than 2 days or if it is severe and watery. This medicine can make you more sensitive to the sun. Keep out of the sun. If you cannot avoid being in the sun, wear protective clothing and use sunscreen. Do not use sun lamps or tanning beds/booths. What side effects may I notice from receiving this medicine? Side effects that you should report to your doctor or health care professional as soon as possible:  allergic reactions like skin rash, itching or hives, swelling of the face, lips, or  tongue  low blood counts - this medicine may decrease the number of white blood cells, red blood cells and platelets. You may be at increased risk for infections and bleeding.  signs of infection - fever or chills, cough, sore throat, pain or difficulty passing urine  signs of decreased platelets or bleeding - bruising, pinpoint red spots on the skin, black, tarry stools, blood in the urine  signs of decreased red blood cells - unusually weak or tired, fainting spells, lightheadedness  breathing problems  changes in vision  chest pain  mouth sores  nausea and vomiting  pain, swelling, redness at site where injected  pain, tingling, numbness in the hands or feet  redness, swelling, or sores on hands or feet  stomach pain  unusual bleeding Side effects that usually do not require medical attention (report to your doctor or health care professional if they continue or are bothersome):  changes in finger or toe  nails  diarrhea  dry or itchy skin  hair loss  headache  loss of appetite  sensitivity of eyes to the light  stomach upset  unusually teary eyes This list may not describe all possible side effects. Call your doctor for medical advice about side effects. You may report side effects to FDA at 1-800-FDA-1088. Where should I keep my medicine? This drug is given in a hospital or clinic and will not be stored at home. NOTE: This sheet is a summary. It may not cover all possible information. If you have questions about this medicine, talk to your doctor, pharmacist, or health care provider.  2020 Elsevier/Gold Standard (2007-08-14 13:53:16)

## 2019-02-17 NOTE — Patient Instructions (Signed)

## 2019-02-18 ENCOUNTER — Other Ambulatory Visit: Payer: Self-pay | Admitting: Family

## 2019-02-19 ENCOUNTER — Other Ambulatory Visit: Payer: Self-pay

## 2019-02-19 ENCOUNTER — Inpatient Hospital Stay: Payer: Medicare HMO

## 2019-02-19 VITALS — BP 155/77 | HR 68 | Temp 97.1°F | Resp 18

## 2019-02-19 DIAGNOSIS — D509 Iron deficiency anemia, unspecified: Secondary | ICD-10-CM | POA: Diagnosis not present

## 2019-02-19 DIAGNOSIS — Z5111 Encounter for antineoplastic chemotherapy: Secondary | ICD-10-CM | POA: Diagnosis not present

## 2019-02-19 DIAGNOSIS — C182 Malignant neoplasm of ascending colon: Secondary | ICD-10-CM | POA: Diagnosis not present

## 2019-02-19 DIAGNOSIS — C189 Malignant neoplasm of colon, unspecified: Secondary | ICD-10-CM

## 2019-02-19 DIAGNOSIS — Z7982 Long term (current) use of aspirin: Secondary | ICD-10-CM | POA: Diagnosis not present

## 2019-02-19 DIAGNOSIS — Z79899 Other long term (current) drug therapy: Secondary | ICD-10-CM | POA: Diagnosis not present

## 2019-02-19 DIAGNOSIS — Z8673 Personal history of transient ischemic attack (TIA), and cerebral infarction without residual deficits: Secondary | ICD-10-CM | POA: Diagnosis not present

## 2019-02-19 DIAGNOSIS — R42 Dizziness and giddiness: Secondary | ICD-10-CM | POA: Diagnosis not present

## 2019-02-19 DIAGNOSIS — R531 Weakness: Secondary | ICD-10-CM | POA: Diagnosis not present

## 2019-02-19 DIAGNOSIS — Z5112 Encounter for antineoplastic immunotherapy: Secondary | ICD-10-CM | POA: Diagnosis not present

## 2019-02-19 MED ORDER — SODIUM CHLORIDE 0.9% FLUSH
10.0000 mL | INTRAVENOUS | Status: DC | PRN
  Administered 2019-02-19: 10 mL
  Filled 2019-02-19: qty 10

## 2019-02-19 MED ORDER — HEPARIN SOD (PORK) LOCK FLUSH 100 UNIT/ML IV SOLN
500.0000 [IU] | Freq: Once | INTRAVENOUS | Status: AC | PRN
  Administered 2019-02-19: 500 [IU]
  Filled 2019-02-19: qty 5

## 2019-02-22 ENCOUNTER — Other Ambulatory Visit: Payer: Self-pay | Admitting: Hematology & Oncology

## 2019-02-27 ENCOUNTER — Telehealth: Payer: Self-pay | Admitting: Endocrinology

## 2019-02-27 NOTE — Telephone Encounter (Signed)
Patient called and is asking if she can split her Januvia and take half in the AM and half in the PM.  Please call to advise at [phone]

## 2019-02-27 NOTE — Telephone Encounter (Signed)
OK 

## 2019-02-27 NOTE — Telephone Encounter (Signed)
Called pt and informed that Dr. Loanne Drilling agreed to her request. Pt thanked me for the call but stated, after further review of her budget, will only be able to afford to take a half of a tablet every day. Advised I would inform Dr. Loanne Drilling.

## 2019-02-27 NOTE — Telephone Encounter (Signed)
Please advise 

## 2019-02-27 NOTE — Telephone Encounter (Signed)
That is OK, also

## 2019-02-28 ENCOUNTER — Telehealth: Payer: Self-pay | Admitting: *Deleted

## 2019-02-28 ENCOUNTER — Other Ambulatory Visit: Payer: Self-pay | Admitting: *Deleted

## 2019-02-28 MED ORDER — TRAMADOL HCL 50 MG PO TABS: 50.0000 mg | ORAL_TABLET | Freq: Four times a day (QID) | ORAL | 0 refills | Status: DC | PRN

## 2019-02-28 MED ORDER — MAGIC MOUTHWASH: ORAL | 0 refills | Status: DC

## 2019-02-28 NOTE — Telephone Encounter (Signed)
Call placed back to patient's daughter, Tonya [name] and message left to notify her per order of Dr. Marin Olp that a prescription for Magic Mouthwash and Tramadol will be sent to [facility] in [place] per order of Dr. Marin Olp for patient's abdominal pain and mouth sores.  Instructed pt.'s daughter to call back with any questions or concerns.

## 2019-02-28 NOTE — Telephone Encounter (Signed)
Call received from patient's daughter stating that patient has continued pain to the right lower side of her abdomen, which increases with movement, that pt is taking Advil and Baclofen with no relief, no nausea, fevers or chills, pt has mouth sores with difficulty swallowing.  Instructed pt.'s daughter that I would speak with Dr. Marin Olp and call her back with MD orders.

## 2019-02-28 NOTE — Telephone Encounter (Signed)
-----   Message from Riley Lam sent at 02/28/2019 10:28 AM EST ----- Patient's daughter Christen Bame call and wants nurse to call her back regarding lower quadrant pain.   Thanks [employer]

## 2019-03-01 DIAGNOSIS — R69 Illness, unspecified: Secondary | ICD-10-CM | POA: Diagnosis not present

## 2019-03-03 ENCOUNTER — Inpatient Hospital Stay: Payer: Medicare HMO | Attending: Hematology & Oncology

## 2019-03-03 ENCOUNTER — Inpatient Hospital Stay: Payer: Medicare HMO

## 2019-03-03 ENCOUNTER — Inpatient Hospital Stay (HOSPITAL_BASED_OUTPATIENT_CLINIC_OR_DEPARTMENT_OTHER): Payer: Medicare HMO | Admitting: Family

## 2019-03-03 ENCOUNTER — Other Ambulatory Visit: Payer: Self-pay

## 2019-03-03 VITALS — BP 155/82 | HR 74 | Temp 97.1°F | Resp 16 | Wt 176.0 lb

## 2019-03-03 DIAGNOSIS — Z8673 Personal history of transient ischemic attack (TIA), and cerebral infarction without residual deficits: Secondary | ICD-10-CM | POA: Insufficient documentation

## 2019-03-03 DIAGNOSIS — D500 Iron deficiency anemia secondary to blood loss (chronic): Secondary | ICD-10-CM

## 2019-03-03 DIAGNOSIS — C189 Malignant neoplasm of colon, unspecified: Secondary | ICD-10-CM

## 2019-03-03 DIAGNOSIS — Z5111 Encounter for antineoplastic chemotherapy: Secondary | ICD-10-CM | POA: Diagnosis present

## 2019-03-03 DIAGNOSIS — D509 Iron deficiency anemia, unspecified: Secondary | ICD-10-CM | POA: Insufficient documentation

## 2019-03-03 DIAGNOSIS — Z5112 Encounter for antineoplastic immunotherapy: Secondary | ICD-10-CM | POA: Insufficient documentation

## 2019-03-03 DIAGNOSIS — C187 Malignant neoplasm of sigmoid colon: Secondary | ICD-10-CM | POA: Diagnosis not present

## 2019-03-03 DIAGNOSIS — C787 Secondary malignant neoplasm of liver and intrahepatic bile duct: Secondary | ICD-10-CM

## 2019-03-03 DIAGNOSIS — C182 Malignant neoplasm of ascending colon: Secondary | ICD-10-CM | POA: Diagnosis present

## 2019-03-03 DIAGNOSIS — Z7982 Long term (current) use of aspirin: Secondary | ICD-10-CM | POA: Diagnosis not present

## 2019-03-03 DIAGNOSIS — Z79899 Other long term (current) drug therapy: Secondary | ICD-10-CM | POA: Diagnosis not present

## 2019-03-03 LAB — IRON AND TIBC
Iron: 60 ug/dL (ref 41–142)
Saturation Ratios: 21 % (ref 21–57)
TIBC: 291 ug/dL (ref 236–444)
UIBC: 231 ug/dL (ref 120–384)

## 2019-03-03 LAB — CBC WITH DIFFERENTIAL (CANCER CENTER ONLY)
Abs Immature Granulocytes: 0.01 10*3/uL (ref 0.00–0.07)
Basophils Absolute: 0 10*3/uL (ref 0.0–0.1)
Basophils Relative: 0 %
Eosinophils Absolute: 0 10*3/uL (ref 0.0–0.5)
Eosinophils Relative: 0 %
HCT: 37.4 % (ref 36.0–46.0)
Hemoglobin: 11.8 g/dL — ABNORMAL LOW (ref 12.0–15.0)
Immature Granulocytes: 0 %
Lymphocytes Relative: 24 %
Lymphs Abs: 1.1 10*3/uL (ref 0.7–4.0)
MCH: 29.4 pg (ref 26.0–34.0)
MCHC: 31.6 g/dL (ref 30.0–36.0)
MCV: 93 fL (ref 80.0–100.0)
Monocytes Absolute: 0.5 10*3/uL (ref 0.1–1.0)
Monocytes Relative: 11 %
Neutro Abs: 2.9 10*3/uL (ref 1.7–7.7)
Neutrophils Relative %: 65 %
Platelet Count: 196 10*3/uL (ref 150–400)
RBC: 4.02 MIL/uL (ref 3.87–5.11)
RDW: 13.6 % (ref 11.5–15.5)
WBC Count: 4.5 10*3/uL (ref 4.0–10.5)
nRBC: 0 % (ref 0.0–0.2)

## 2019-03-03 LAB — CMP (CANCER CENTER ONLY)
ALT: 16 U/L (ref 0–44)
AST: 21 U/L (ref 15–41)
Albumin: 3.9 g/dL (ref 3.5–5.0)
Alkaline Phosphatase: 65 U/L (ref 38–126)
Anion gap: 8 (ref 5–15)
BUN: 14 mg/dL (ref 8–23)
CO2: 29 mmol/L (ref 22–32)
Calcium: 9.7 mg/dL (ref 8.9–10.3)
Chloride: 106 mmol/L (ref 98–111)
Creatinine: 0.77 mg/dL (ref 0.44–1.00)
GFR, Est AFR Am: >60 mL/min (ref >60)
GFR, Estimated: >60 mL/min (ref >60)
Glucose, Bld: 164 mg/dL — ABNORMAL HIGH (ref 70–99)
Potassium: 3.8 mmol/L (ref 3.5–5.1)
Sodium: 143 mmol/L (ref 135–145)
Total Bilirubin: 0.6 mg/dL (ref 0.3–1.2)
Total Protein: 5.8 g/dL — ABNORMAL LOW (ref 6.5–8.1)

## 2019-03-03 LAB — FERRITIN: Ferritin: 313 ng/mL — ABNORMAL HIGH (ref 11–307)

## 2019-03-03 MED ORDER — FLUOROURACIL CHEMO INJECTION 2.5 GM/50ML
320.0000 mg/m2 | Freq: Once | INTRAVENOUS | Status: AC
  Administered 2019-03-03: 650 mg via INTRAVENOUS
  Filled 2019-03-03: qty 13

## 2019-03-03 MED ORDER — SODIUM CHLORIDE 0.9 % IV SOLN
5.0000 mg/kg | Freq: Once | INTRAVENOUS | Status: AC
  Administered 2019-03-03: 400 mg via INTRAVENOUS
  Filled 2019-03-03: qty 16

## 2019-03-03 MED ORDER — PALONOSETRON HCL INJECTION 0.25 MG/5ML
0.2500 mg | Freq: Once | INTRAVENOUS | Status: AC
  Administered 2019-03-03: 0.25 mg via INTRAVENOUS

## 2019-03-03 MED ORDER — SODIUM CHLORIDE 0.9 % IV SOLN
135.0000 mg/m2 | Freq: Once | INTRAVENOUS | Status: AC
  Administered 2019-03-03: 260 mg via INTRAVENOUS
  Filled 2019-03-03: qty 13

## 2019-03-03 MED ORDER — SODIUM CHLORIDE 0.9 % IV SOLN
400.0000 mg/m2 | Freq: Once | INTRAVENOUS | Status: AC
  Administered 2019-03-03: 784 mg via INTRAVENOUS
  Filled 2019-03-03: qty 39.2

## 2019-03-03 MED ORDER — PALONOSETRON HCL INJECTION 0.25 MG/5ML
INTRAVENOUS | Status: AC
  Filled 2019-03-03: qty 5

## 2019-03-03 MED ORDER — SODIUM CHLORIDE 0.9% FLUSH
10.0000 mL | INTRAVENOUS | Status: DC | PRN
  Filled 2019-03-03: qty 10

## 2019-03-03 MED ORDER — ATROPINE SULFATE 1 MG/ML IJ SOLN: 0.5000 mg | Freq: Once | INTRAMUSCULAR | Status: DC | PRN

## 2019-03-03 MED ORDER — SODIUM CHLORIDE 0.9 % IV SOLN
Freq: Once | INTRAVENOUS | Status: AC
  Administered 2019-03-03: 10:00:00 via INTRAVENOUS
  Filled 2019-03-03: qty 250

## 2019-03-03 MED ORDER — DEXAMETHASONE SODIUM PHOSPHATE 10 MG/ML IJ SOLN
INTRAMUSCULAR | Status: AC
  Filled 2019-03-03: qty 1

## 2019-03-03 MED ORDER — DEXAMETHASONE SODIUM PHOSPHATE 10 MG/ML IJ SOLN
10.0000 mg | Freq: Once | INTRAMUSCULAR | Status: AC
  Administered 2019-03-03: 10 mg via INTRAVENOUS

## 2019-03-03 MED ORDER — HEPARIN SOD (PORK) LOCK FLUSH 100 UNIT/ML IV SOLN
500.0000 [IU] | Freq: Once | INTRAVENOUS | Status: DC | PRN
  Filled 2019-03-03: qty 5

## 2019-03-03 MED ORDER — SODIUM CHLORIDE 0.9 % IV SOLN
1920.0000 mg/m2 | INTRAVENOUS | Status: DC
  Administered 2019-03-03: 3750 mg via INTRAVENOUS
  Filled 2019-03-03: qty 75

## 2019-03-03 NOTE — Patient Instructions (Signed)
Fluorouracil, 5-FU injection What is this medicine? FLUOROURACIL, 5-FU (flure oh YOOR a sil) is a chemotherapy drug. It slows the growth of cancer cells. This medicine is used to treat many types of cancer like breast cancer, colon or rectal cancer, pancreatic cancer, and stomach cancer. This medicine may be used for other purposes; ask your health care provider or pharmacist if you have questions. COMMON BRAND NAME(S): Adrucil What should I tell my health care provider before I take this medicine? They need to know if you have any of these conditions:  blood disorders  dihydropyrimidine dehydrogenase (DPD) deficiency  infection (especially a virus infection such as chickenpox, cold sores, or herpes)  kidney disease  liver disease  malnourished, poor nutrition  recent or ongoing radiation therapy  an unusual or allergic reaction to fluorouracil, other chemotherapy, other medicines, foods, dyes, or preservatives  pregnant or trying to get pregnant  breast-feeding How should I use this medicine? This drug is given as an infusion or injection into a vein. It is administered in a hospital or clinic by a specially trained health care professional. Talk to your pediatrician regarding the use of this medicine in children. Special care may be needed. Overdosage: If you think you have taken too much of this medicine contact a poison control center or emergency room at once. NOTE: This medicine is only for you. Do not share this medicine with others. What if I miss a dose? It is important not to miss your dose. Call your doctor or health care professional if you are unable to keep an appointment. What may interact with this medicine?  allopurinol  cimetidine  dapsone  digoxin  hydroxyurea  leucovorin  levamisole  medicines for seizures like ethotoin, fosphenytoin, phenytoin  medicines to increase blood counts like filgrastim, pegfilgrastim, sargramostim  medicines that  treat or prevent blood clots like warfarin, enoxaparin, and dalteparin  methotrexate  metronidazole  pyrimethamine  some other chemotherapy drugs like busulfan, cisplatin, estramustine, vinblastine  trimethoprim  trimetrexate  vaccines Talk to your doctor or health care professional before taking any of these medicines:  acetaminophen  aspirin  ibuprofen  ketoprofen  naproxen This list may not describe all possible interactions. Give your health care provider a list of all the medicines, herbs, non-prescription drugs, or dietary supplements you use. Also tell them if you smoke, drink alcohol, or use illegal drugs. Some items may interact with your medicine. What should I watch for while using this medicine? Visit your doctor for checks on your progress. This drug may make you feel generally unwell. This is not uncommon, as chemotherapy can affect healthy cells as well as cancer cells. Report any side effects. Continue your course of treatment even though you feel ill unless your doctor tells you to stop. In some cases, you may be given additional medicines to help with side effects. Follow all directions for their use. Call your doctor or health care professional for advice if you get a fever, chills or sore throat, or other symptoms of a cold or flu. Do not treat yourself. This drug decreases your body's ability to fight infections. Try to avoid being around people who are sick. This medicine may increase your risk to bruise or bleed. Call your doctor or health care professional if you notice any unusual bleeding. Be careful brushing and flossing your teeth or using a toothpick because you may get an infection or bleed more easily. If you have any dental work done, tell your dentist you are  receiving this medicine. Avoid taking products that contain aspirin, acetaminophen, ibuprofen, naproxen, or ketoprofen unless instructed by your doctor. These medicines may hide a fever. Do not  become pregnant while taking this medicine. Women should inform their doctor if they wish to become pregnant or think they might be pregnant. There is a potential for serious side effects to an unborn child. Talk to your health care professional or pharmacist for more information. Do not breast-feed an infant while taking this medicine. Men should inform their doctor if they wish to father a child. This medicine may lower sperm counts. Do not treat diarrhea with over the counter products. Contact your doctor if you have diarrhea that lasts more than 2 days or if it is severe and watery. This medicine can make you more sensitive to the sun. Keep out of the sun. If you cannot avoid being in the sun, wear protective clothing and use sunscreen. Do not use sun lamps or tanning beds/booths. What side effects may I notice from receiving this medicine? Side effects that you should report to your doctor or health care professional as soon as possible:  allergic reactions like skin rash, itching or hives, swelling of the face, lips, or tongue  low blood counts - this medicine may decrease the number of white blood cells, red blood cells and platelets. You may be at increased risk for infections and bleeding.  signs of infection - fever or chills, cough, sore throat, pain or difficulty passing urine  signs of decreased platelets or bleeding - bruising, pinpoint red spots on the skin, black, tarry stools, blood in the urine  signs of decreased red blood cells - unusually weak or tired, fainting spells, lightheadedness  breathing problems  changes in vision  chest pain  mouth sores  nausea and vomiting  pain, swelling, redness at site where injected  pain, tingling, numbness in the hands or feet  redness, swelling, or sores on hands or feet  stomach pain  unusual bleeding Side effects that usually do not require medical attention (report to your doctor or health care professional if they  continue or are bothersome):  changes in finger or toe nails  diarrhea  dry or itchy skin  hair loss  headache  loss of appetite  sensitivity of eyes to the light  stomach upset  unusually teary eyes This list may not describe all possible side effects. Call your doctor for medical advice about side effects. You may report side effects to FDA at 1-800-FDA-1088. Where should I keep my medicine? This drug is given in a hospital or clinic and will not be stored at home. NOTE: This sheet is a summary. It may not cover all possible information. If you have questions about this medicine, talk to your doctor, pharmacist, or health care provider.  2020 [employer] Standard (2007-08-14 13:53:16) Leucovorin injection What is this medicine? LEUCOVORIN (loo koe VOR in) is used to prevent or treat the harmful effects of some medicines. This medicine is used to treat anemia caused by a low amount of folic acid in the body. It is also used with 5-fluorouracil (5-FU) to treat colon cancer. This medicine may be used for other purposes; ask your health care provider or pharmacist if you have questions. What should I tell my health care provider before I take this medicine? They need to know if you have any of these conditions:  anemia from low levels of vitamin B-12 in the blood  an unusual or allergic reaction to  leucovorin, folic acid, other medicines, foods, dyes, or preservatives  pregnant or trying to get pregnant  breast-feeding How should I use this medicine? This medicine is for injection into a muscle or into a vein. It is given by a health care professional in a hospital or clinic setting. Talk to your pediatrician regarding the use of this medicine in children. Special care may be needed. Overdosage: If you think you have taken too much of this medicine contact a poison control center or emergency room at once. NOTE: This medicine is only for you. Do not share this medicine with  others. What if I miss a dose? This does not apply. What may interact with this medicine?  capecitabine  fluorouracil  phenobarbital  phenytoin  primidone  trimethoprim-sulfamethoxazole This list may not describe all possible interactions. Give your health care provider a list of all the medicines, herbs, non-prescription drugs, or dietary supplements you use. Also tell them if you smoke, drink alcohol, or use illegal drugs. Some items may interact with your medicine. What should I watch for while using this medicine? Your condition will be monitored carefully while you are receiving this medicine. This medicine may increase the side effects of 5-fluorouracil, 5-FU. Tell your doctor or health care professional if you have diarrhea or mouth sores that do not get better or that get worse. What side effects may I notice from receiving this medicine? Side effects that you should report to your doctor or health care professional as soon as possible:  allergic reactions like skin rash, itching or hives, swelling of the face, lips, or tongue  breathing problems  fever, infection  mouth sores  unusual bleeding or bruising  unusually weak or tired Side effects that usually do not require medical attention (report to your doctor or health care professional if they continue or are bothersome):  constipation or diarrhea  loss of appetite  nausea, vomiting This list may not describe all possible side effects. Call your doctor for medical advice about side effects. You may report side effects to FDA at 1-800-FDA-1088. Where should I keep my medicine? This drug is given in a hospital or clinic and will not be stored at home. NOTE: This sheet is a summary. It may not cover all possible information. If you have questions about this medicine, talk to your doctor, pharmacist, or health care provider.  2020 [employer]/Gold Standard (2007-10-15 16:50:29) Irinotecan injection What is this  medicine? IRINOTECAN (ir in oh TEE kan ) is a chemotherapy drug. It is used to treat colon and rectal cancer. This medicine may be used for other purposes; ask your health care provider or pharmacist if you have questions. COMMON BRAND NAME(S): Camptosar What should I tell my health care provider before I take this medicine? They need to know if you have any of these conditions:  dehydration  diarrhea  infection (especially a virus infection such as chickenpox, cold sores, or herpes)  liver disease  low blood counts, like low white cell, platelet, or red cell counts  low levels of calcium, magnesium, or potassium in the blood  recent or ongoing radiation therapy  an unusual or allergic reaction to irinotecan, other medicines, foods, dyes, or preservatives  pregnant or trying to get pregnant  breast-feeding How should I use this medicine? This drug is given as an infusion into a vein. It is administered in a hospital or clinic by a specially trained health care professional. Talk to your pediatrician regarding the use of this medicine   in children. Special care may be needed. Overdosage: If you think you have taken too much of this medicine contact a poison control center or emergency room at once. NOTE: This medicine is only for you. Do not share this medicine with others. What if I miss a dose? It is important not to miss your dose. Call your doctor or health care professional if you are unable to keep an appointment. What may interact with this medicine? This medicine may interact with the following medications:  antiviral medicines for HIV or AIDS  certain antibiotics like rifampin or rifabutin  certain medicines for fungal infections like itraconazole, ketoconazole, posaconazole, and voriconazole  certain medicines for seizures like carbamazepine, phenobarbital, phenotoin  clarithromycin  gemfibrozil  nefazodone  St. John's Wort This list may not describe all  possible interactions. Give your health care provider a list of all the medicines, herbs, non-prescription drugs, or dietary supplements you use. Also tell them if you smoke, drink alcohol, or use illegal drugs. Some items may interact with your medicine. What should I watch for while using this medicine? Your condition will be monitored carefully while you are receiving this medicine. You will need important blood work done while you are taking this medicine. This drug may make you feel generally unwell. This is not uncommon, as chemotherapy can affect healthy cells as well as cancer cells. Report any side effects. Continue your course of treatment even though you feel ill unless your doctor tells you to stop. In some cases, you may be given additional medicines to help with side effects. Follow all directions for their use. You may get drowsy or dizzy. Do not drive, use machinery, or do anything that needs mental alertness until you know how this medicine affects you. Do not stand or sit up quickly, especially if you are an older patient. This reduces the risk of dizzy or fainting spells. Call your health care professional for advice if you get a fever, chills, or sore throat, or other symptoms of a cold or flu. Do not treat yourself. This medicine decreases your body's ability to fight infections. Try to avoid being around people who are sick. Avoid taking products that contain aspirin, acetaminophen, ibuprofen, naproxen, or ketoprofen unless instructed by your doctor. These medicines may hide a fever. This medicine may increase your risk to bruise or bleed. Call your doctor or health care professional if you notice any unusual bleeding. Be careful brushing and flossing your teeth or using a toothpick because you may get an infection or bleed more easily. If you have any dental work done, tell your dentist you are receiving this medicine. Do not become pregnant while taking this medicine or for 6 months  after stopping it. Women should inform their health care professional if they wish to become pregnant or think they might be pregnant. Men should not father a child while taking this medicine and for 3 months after stopping it. There is potential for serious side effects to an unborn child. Talk to your health care professional for more information. Do not breast-feed an infant while taking this medicine or for 7 days after stopping it. This medicine has caused ovarian failure in some women. This medicine may make it more difficult to get pregnant. Talk to your health care professional if you are concerned about your fertility. This medicine has caused decreased sperm counts in some men. This may make it more difficult to father a child. Talk to your health care professional if you   are concerned about your fertility. What side effects may I notice from receiving this medicine? Side effects that you should report to your doctor or health care professional as soon as possible:  allergic reactions like skin rash, itching or hives, swelling of the face, lips, or tongue  chest pain  diarrhea  flushing, runny nose, sweating during infusion  low blood counts - this medicine may decrease the number of white blood cells, red blood cells and platelets. You may be at increased risk for infections and bleeding.  nausea, vomiting  pain, swelling, warmth in the leg  signs of decreased platelets or bleeding - bruising, pinpoint red spots on the skin, black, tarry stools, blood in the urine  signs of infection - fever or chills, cough, sore throat, pain or difficulty passing urine  signs of decreased red blood cells - unusually weak or tired, fainting spells, lightheadedness Side effects that usually do not require medical attention (report to your doctor or health care professional if they continue or are bothersome):  constipation  hair loss  headache  loss of appetite  mouth sores  stomach  pain This list may not describe all possible side effects. Call your doctor for medical advice about side effects. You may report side effects to FDA at 1-800-FDA-1088. Where should I keep my medicine? This drug is given in a hospital or clinic and will not be stored at home. NOTE: This sheet is a summary. It may not cover all possible information. If you have questions about this medicine, talk to your doctor, pharmacist, or health care provider.  2020 [employer]/Gold Standard (2018-05-31 10:09:17) Bevacizumab injection What is this medicine? BEVACIZUMAB (be va SIZ yoo mab) is a monoclonal antibody. It is used to treat many types of cancer. This medicine may be used for other purposes; ask your health care provider or pharmacist if you have questions. COMMON BRAND NAME(S): Avastin, MVASI, Zirabev What should I tell my health care provider before I take this medicine? They need to know if you have any of these conditions:  diabetes  heart disease  high blood pressure  history of coughing up blood  prior anthracycline chemotherapy (e.g., doxorubicin, daunorubicin, epirubicin)  recent or ongoing radiation therapy  recent or planning to have surgery  stroke  an unusual or allergic reaction to bevacizumab, hamster proteins, mouse proteins, other medicines, foods, dyes, or preservatives  pregnant or trying to get pregnant  breast-feeding How should I use this medicine? This medicine is for infusion into a vein. It is given by a health care professional in a hospital or clinic setting. Talk to your pediatrician regarding the use of this medicine in children. Special care may be needed. Overdosage: If you think you have taken too much of this medicine contact a poison control center or emergency room at once. NOTE: This medicine is only for you. Do not share this medicine with others. What if I miss a dose? It is important not to miss your dose. Call your doctor or health care  professional if you are unable to keep an appointment. What may interact with this medicine? Interactions are not expected. This list may not describe all possible interactions. Give your health care provider a list of all the medicines, herbs, non-prescription drugs, or dietary supplements you use. Also tell them if you smoke, drink alcohol, or use illegal drugs. Some items may interact with your medicine. What should I watch for while using this medicine? Your condition will be monitored carefully while   you are receiving this medicine. You will need important blood work and urine testing done while you are taking this medicine. This medicine may increase your risk to bruise or bleed. Call your doctor or health care professional if you notice any unusual bleeding. This medicine should be started at least 28 days following major surgery and the site of the surgery should be totally healed. Check with your doctor before scheduling dental work or surgery while you are receiving this treatment. Talk to your doctor if you have recently had surgery or if you have a wound that has not healed. Do not become pregnant while taking this medicine or for 6 months after stopping it. Women should inform their doctor if they wish to become pregnant or think they might be pregnant. There is a potential for serious side effects to an unborn child. Talk to your health care professional or pharmacist for more information. Do not breast-feed an infant while taking this medicine and for 6 months after the last dose. This medicine has caused ovarian failure in some women. This medicine may interfere with the ability to have a child. You should talk to your doctor or health care professional if you are concerned about your fertility. What side effects may I notice from receiving this medicine? Side effects that you should report to your doctor or health care professional as soon as possible:  allergic reactions like skin  rash, itching or hives, swelling of the face, lips, or tongue  chest pain or chest tightness  chills  coughing up blood  high fever  seizures  severe constipation  signs and symptoms of bleeding such as bloody or black, tarry stools; red or dark-brown urine; spitting up blood or brown material that looks like coffee grounds; red spots on the skin; unusual bruising or bleeding from the eye, gums, or nose  signs and symptoms of a blood clot such as breathing problems; chest pain; severe, sudden headache; pain, swelling, warmth in the leg  signs and symptoms of a stroke like changes in vision; confusion; trouble speaking or understanding; severe headaches; sudden numbness or weakness of the face, arm or leg; trouble walking; dizziness; loss of balance or coordination  stomach pain  sweating  swelling of legs or ankles  vomiting  weight gain Side effects that usually do not require medical attention (report to your doctor or health care professional if they continue or are bothersome):  back pain  changes in taste  decreased appetite  dry skin  nausea  tiredness This list may not describe all possible side effects. Call your doctor for medical advice about side effects. You may report side effects to FDA at 1-800-FDA-1088. Where should I keep my medicine? This drug is given in a hospital or clinic and will not be stored at home. NOTE: This sheet is a summary. It may not cover all possible information. If you have questions about this medicine, talk to your doctor, pharmacist, or health care provider.  2020 [employer] Standard (2016-04-07 14:33:29)  

## 2019-03-03 NOTE — Patient Instructions (Signed)

## 2019-03-03 NOTE — Progress Notes (Signed)
Hematology and Oncology Follow Up Visit  Tonya [name] [id number] [birth year] 78 y.o. 03/03/2019   Principle Diagnosis:  Stage IV adenocarcinoma of the ascending colon -- NRAS+, BRAF-, HER2-, MSI/MMR -,  Iron deficiency anemia  Past Therapy: FOLFOX - s/p cycle 4  Current Therapy:   FOLFIRI/Bevacizumab -started 01/20/2019, s/p cycle 3 IV Iron as indicated   Interim History:  Tonya [name] is here today with her daughter for follow-up and cycle 4 of treatment.  CEA was down to 2.81 last month.  She still has muscle spasms off and on in her right side. She just picked up the Baclofen over the weekend and plans take this evening.  She also picked up the Magic mouthwash and will start today for sores in her mouth.  She denies fever, chills, n/v, cough, rash, dizziness, SOB, chest pain, abdominal pain or changes in bowel or bladder habits.  She state that she sometimes has palpitations when she gets a little stressed and will take a break from whatever she is doing (computer time) if needed.  She has swelling along with numbness and tingling in the left side of her body since her stroke. This is stable.  No falls or syncopal episodes to report.  She states that she has a great appetite and has also added Glucerna daily to her routine. She is hydrating well and her weight is stable.   ECOG Performance Status: 2 - Symptomatic, <50% confined to bed  Medications:  Allergies as of 03/03/2019      Reactions   Hydrocodone Swelling, Other (See Comments)   "I started swelling, became red, and passed out"   Iohexol Hives, Shortness Of Breath, Other (See Comments)   Patient developed hives and fullness in throat post injection of 125cc's Omni 300, Onset Date: 11/15/2006   Naproxen Other (See Comments)   "It made me feel out of my head"   Ibuprofen Nausea Only   Propoxyphene N-acetaminophen Other (See Comments)   "I couldn't find the door to make my way out of the room- I was in misery"       Medication List       Accurate as of March 03, 2019  9:44 AM. If you have any questions, ask your nurse or doctor.        amLODipine 10 MG tablet Commonly known as: NORVASC Take 1 tablet (10 mg total) by mouth daily.   aspirin EC 81 MG tablet Take 81 mg by mouth daily.   atorvastatin 10 MG tablet Commonly known as: LIPITOR TAKE 1 TABLET(10 MG) BY MOUTH DAILY What changed:   how much to take  how to take this  when to take this  additional instructions   baclofen 10 MG tablet Commonly known as: LIORESAL TAKE 1/2 TABLET(5 MG) BY MOUTH EVERY 8 HOURS AS NEEDED FOR MUSCLE SPASMS   Cholecalciferol 50 MCG (2000 UT) Tabs Take 1 tablet (2,000 Units total) by mouth daily.   cyclobenzaprine 7.5 MG tablet Commonly known as: FEXMID Take 1 tablet (7.5 mg total) by mouth 3 (three) times daily as needed for muscle spasms.   dexamethasone 4 MG tablet Commonly known as: DECADRON   dronabinol 2.5 MG capsule Commonly known as: MARINOL Take 1 capsule (2.5 mg total) by mouth 2 (two) times daily before a meal.   lidocaine 2 % solution Commonly known as: XYLOCAINE   loperamide 2 MG tablet Commonly known as: Imodium A-D Take 2 tablets (4 mg total) by mouth 4 (four) times daily as  needed. Take 2 at diarrhea onset , then 1 every 2hr until 12hrs with no BM. May take 2 every 4hrs at night. If diarrhea recurs repeat.   magic mouthwash Soln Swish and swallow equal parts Benadryl, Lidocaine and Nystatin.  5- 10 ml.'s four times a day as needed.   meclizine 12.5 MG tablet Commonly known as: ANTIVERT Take 1 tablet (12.5 mg total) by mouth 3 (three) times daily as needed for dizziness.   metoprolol tartrate 25 MG tablet Commonly known as: LOPRESSOR TAKE 1/2 TABLET(12.5 MG) BY MOUTH TWICE DAILY   olmesartan-hydrochlorothiazide 20-12.5 MG tablet Commonly known as: BENICAR HCT TK 1 T PO D   OneTouch Delica Plus [id number] Misc   sitaGLIPtin 50 MG tablet Commonly known as: JANUVIA  Take 1 tablet (50 mg total) by mouth daily.   traMADol 50 MG tablet Commonly known as: ULTRAM Take 1 tablet (50 mg total) by mouth every 6 (six) hours as needed.   Tyler Aas FlexTouch 100 UNIT/ML Sopn FlexTouch Pen Generic drug: insulin degludec Inject 0.28 mLs (28 Units total) into the skin daily.       Allergies:  Allergies  Allergen Reactions  . Hydrocodone Swelling and Other (See Comments)    "I started swelling, became red, and passed out"  . Iohexol Hives, Shortness Of Breath and Other (See Comments)    Patient developed hives and fullness in throat post injection of 125cc's Omni 300, Onset Date: 11/15/2006   . Naproxen Other (See Comments)    "It made me feel out of my head"  . Ibuprofen Nausea Only  . Propoxyphene N-Acetaminophen Other (See Comments)    "I couldn't find the door to make my way out of the room- I was in misery"    Past Medical History, Surgical history, Social history, and Family History were reviewed and updated.  Review of Systems: All other 10 point review of systems is negative.   Physical Exam:  weight is 176 lb (79.8 kg). Her temporal temperature is 97.1 F (36.2 C) (abnormal). Her blood pressure is 155/82 (abnormal) and her pulse is 74. Her respiration is 16 and oxygen saturation is 100%.   Wt Readings from Last 3 Encounters:  03/03/19 176 lb (79.8 kg)  02/03/19 177 lb (80.3 kg)  01/28/19 177 lb 6.4 oz (80.5 kg)    Ocular: Sclerae unicteric, pupils equal, round and reactive to light Ear-nose-throat: Oropharynx clear, dentition fair Lymphatic: No cervical or supraclavicular adenopathy Lungs no rales or rhonchi, good excursion bilaterally Heart regular rate and rhythm, no murmur appreciated Abd soft, nontender, positive bowel sounds, no liver or spleen tip palpated on exam, no fluid wave  MSK no focal spinal tenderness, no joint edema Neuro: non-focal, well-oriented, appropriate affect Breasts: Deferred   Lab Results  Component Value  Date   WBC 4.5 03/03/2019   HGB 11.8 (L) 03/03/2019   HCT 37.4 03/03/2019   MCV 93.0 03/03/2019   PLT 196 03/03/2019   Lab Results  Component Value Date   FERRITIN 325 (H) 02/17/2019   IRON 65 02/17/2019   TIBC 305 02/17/2019   UIBC 240 02/17/2019   IRONPCTSAT 21 02/17/2019   Lab Results  Component Value Date   RETICCTPCT 1.5 05/17/2016   RBC 4.02 03/03/2019   RETICCTABS 62,400 05/17/2016   No results found for: KPAFRELGTCHN, LAMBDASER, KAPLAMBRATIO No results found for: IGGSERUM, IGA, IGMSERUM No results found for: TOTALPROTELP, ALBUMINELP, A1GS, A2GS, BETS, BETA2SER, GAMS, MSPIKE, SPEI   Chemistry      Component Value Date/Time  NA 140 02/17/2019 1020   NA 139 09/30/2018 1311   K 4.2 02/17/2019 1020   CL 104 02/17/2019 1020   CO2 29 02/17/2019 1020   BUN 12 02/17/2019 1020   BUN 10 09/30/2018 1311   CREATININE 0.78 02/17/2019 1020      Component Value Date/Time   CALCIUM 9.7 02/17/2019 1020   ALKPHOS 77 02/17/2019 1020   AST 19 02/17/2019 1020   ALT 13 02/17/2019 1020   BILITOT 0.6 02/17/2019 1020       Impression and Plan: Ms. Barbe is a very pleasant 78 yo African American female with metastatic colon cancer.  We will proceed with treatment today as planned.  We will repeat her PET scan to assess response in 2 weeks, prior to her MD follow-up.  We will see what her iron studies show and bring her back in for infusion if needed.  They will contact our office with any questions or concerns. We can certainly see her sooner if needed.   Laverna Peace, NP 11/9/20209:44 AM

## 2019-03-04 ENCOUNTER — Ambulatory Visit: Payer: Medicare HMO | Admitting: Endocrinology

## 2019-03-05 ENCOUNTER — Other Ambulatory Visit: Payer: Self-pay | Admitting: Family

## 2019-03-05 ENCOUNTER — Other Ambulatory Visit: Payer: Self-pay

## 2019-03-05 ENCOUNTER — Inpatient Hospital Stay: Payer: Medicare HMO

## 2019-03-05 VITALS — BP 158/75 | HR 64 | Temp 97.6°F | Resp 17

## 2019-03-05 DIAGNOSIS — Z7982 Long term (current) use of aspirin: Secondary | ICD-10-CM | POA: Diagnosis not present

## 2019-03-05 DIAGNOSIS — C787 Secondary malignant neoplasm of liver and intrahepatic bile duct: Secondary | ICD-10-CM

## 2019-03-05 DIAGNOSIS — Z5111 Encounter for antineoplastic chemotherapy: Secondary | ICD-10-CM | POA: Diagnosis not present

## 2019-03-05 DIAGNOSIS — Z5112 Encounter for antineoplastic immunotherapy: Secondary | ICD-10-CM | POA: Diagnosis not present

## 2019-03-05 DIAGNOSIS — C189 Malignant neoplasm of colon, unspecified: Secondary | ICD-10-CM

## 2019-03-05 DIAGNOSIS — Z79899 Other long term (current) drug therapy: Secondary | ICD-10-CM | POA: Diagnosis not present

## 2019-03-05 DIAGNOSIS — Z8673 Personal history of transient ischemic attack (TIA), and cerebral infarction without residual deficits: Secondary | ICD-10-CM | POA: Diagnosis not present

## 2019-03-05 DIAGNOSIS — D509 Iron deficiency anemia, unspecified: Secondary | ICD-10-CM | POA: Diagnosis not present

## 2019-03-05 DIAGNOSIS — C182 Malignant neoplasm of ascending colon: Secondary | ICD-10-CM | POA: Diagnosis not present

## 2019-03-05 MED ORDER — SODIUM CHLORIDE 0.9% FLUSH
10.0000 mL | INTRAVENOUS | Status: DC | PRN
  Administered 2019-03-05: 10 mL
  Filled 2019-03-05: qty 10

## 2019-03-05 MED ORDER — HEPARIN SOD (PORK) LOCK FLUSH 100 UNIT/ML IV SOLN
500.0000 [IU] | Freq: Once | INTRAVENOUS | Status: AC | PRN
  Administered 2019-03-05: 500 [IU]
  Filled 2019-03-05: qty 5

## 2019-03-10 ENCOUNTER — Other Ambulatory Visit: Payer: Self-pay

## 2019-03-12 ENCOUNTER — Encounter: Payer: Self-pay | Admitting: Endocrinology

## 2019-03-12 ENCOUNTER — Ambulatory Visit (INDEPENDENT_AMBULATORY_CARE_PROVIDER_SITE_OTHER): Payer: Medicare HMO | Admitting: Endocrinology

## 2019-03-12 ENCOUNTER — Other Ambulatory Visit: Payer: Self-pay

## 2019-03-12 VITALS — BP 140/92 | HR 74 | Ht 71.0 in | Wt 181.0 lb

## 2019-03-12 DIAGNOSIS — E1149 Type 2 diabetes mellitus with other diabetic neurological complication: Secondary | ICD-10-CM

## 2019-03-12 DIAGNOSIS — Z794 Long term (current) use of insulin: Secondary | ICD-10-CM

## 2019-03-12 LAB — POCT GLYCOSYLATED HEMOGLOBIN (HGB A1C): Hemoglobin A1C: 6.1 % — AB (ref 4.0–5.6)

## 2019-03-12 MED ORDER — TRESIBA FLEXTOUCH 100 UNIT/ML ~~LOC~~ SOPN: 24.0000 [IU] | PEN_INJECTOR | Freq: Every day | SUBCUTANEOUS | 11 refills | Status: DC

## 2019-03-12 NOTE — Progress Notes (Signed)
Subjective:    Patient ID: Tonya [name], female    DOB: 24-Sep-1940, 78 y.o.   MRN: [id number]  HPI Pt returns for f/u of diabetes mellitus: DM type: Insulin-requiring type 2 (but lean body habitus raises possibility she is evolving type 1).   Dx'ed: [id number] Complications: CVA Therapy: insulin since 2020, and Januvia    GDM: never DKA: never Severe hypoglycemia: never.   Pancreatitis: never Pancreatic imaging: normal on 2010 [place].  Other: she did not tolerate metformin-XR (abd pain); she also took insulin only for a brief time after diagnosis; edema limits rx options; she is not a candidate for aggressive glycemic control, given colon cancer Interval history: she brings a record of her cbg's which I have reviewed today.  cbg varies from 85-366.  It is in general higher as the day goes on, and for 1 day after each chemo tx.  chemo has been extended x 2 more txs.  She takes decadron just 1 day after each chemotx.  Pt says she can afford [place] for now.  She stopped Januvia 2 weeks ago, due to cost.   Past Medical History:  Diagnosis Date  . Cerebrovascular accident ([place])   . Chronic edema    ? venous insufficiency  . COPD (chronic obstructive pulmonary disease) ([place])    never had a problem breathing  . Diabetes mellitus without complication ([place])   . Enlarged heart    per pt  . Gallstones   . GERD (gastroesophageal reflux disease)   . Goals of care, counseling/discussion 11/01/2018  . Hyperlipidemia   . Hypertension     Past Surgical History:  Procedure Laterality Date  . ABDOMINAL HYSTERECTOMY    . BIOPSY  10/18/2018   Procedure: BIOPSY;  Surgeon: Jackquline Denmark, MD;  Location: [facility] ENDOSCOPY;  Service: Endoscopy;;  . CHOLECYSTECTOMY    . COLONOSCOPY WITH PROPOFOL N/A 10/18/2018   Procedure: COLONOSCOPY WITH PROPOFOL;  Surgeon: Jackquline Denmark, MD;  Location: [facility] ENDOSCOPY;  Service: Endoscopy;  Laterality: N/A;  . IR IMAGING GUIDED PORT INSERTION  11/11/2018  . POLYPECTOMY  10/18/2018    Procedure: POLYPECTOMY;  Surgeon: Jackquline Denmark, MD;  Location: [facility] ENDOSCOPY;  Service: Endoscopy;;  . SUBMUCOSAL TATTOO INJECTION  10/18/2018   Procedure: SUBMUCOSAL TATTOO INJECTION;  Surgeon: Jackquline Denmark, MD;  Location: [facility] ENDOSCOPY;  Service: Endoscopy;;    Social History   Socioeconomic History  . Marital status: Widowed    Spouse name: Not on file  . Number of children: 5  . Years of education: Not on file  . Highest education level: Not on file  Occupational History  . Not on file  Social Needs  . Financial resource strain: Not on file  . Food insecurity    Worry: Not on file    Inability: Not on file  . Transportation needs    Medical: Not on file    Non-medical: Not on file  Tobacco Use  . Smoking status: Former Smoker    Packs/day: 0.25    Years: 1.00    Pack years: 0.25    Quit date: 07/23/1968    Years since quitting: 50.6  . Smokeless tobacco: Never Used  Substance and Sexual Activity  . Alcohol use: No    Alcohol/week: 0.0 standard drinks  . Drug use: No  . Sexual activity: Not Currently  Lifestyle  . Physical activity    Days per week: Not on file    Minutes per session: Not on file  . Stress: Not on  file  Relationships  . Social Herbalist on phone: Not on file    Gets together: Not on file    Attends religious service: Not on file    Active member of club or organization: Not on file    Attends meetings of clubs or organizations: Not on file    Relationship status: Not on file  . Intimate partner violence    Fear of current or ex partner: Not on file    Emotionally abused: Not on file    Physically abused: Not on file    Forced sexual activity: Not on file  Other Topics Concern  . Not on file  Social History Narrative  . Not on file    Current Outpatient Medications on File Prior to Visit  Medication Sig Dispense Refill  . amLODipine (NORVASC) 10 MG tablet Take 1 tablet (10 mg total) by mouth daily. 30 tablet 0  . aspirin EC  81 MG tablet Take 81 mg by mouth daily.    Marland Kitchen atorvastatin (LIPITOR) 10 MG tablet TAKE 1 TABLET(10 MG) BY MOUTH DAILY (Patient taking differently: Take 10 mg by mouth daily. ) 90 tablet 1  . baclofen (LIORESAL) 10 MG tablet TAKE 1/2 TABLET(5 MG) BY MOUTH EVERY 8 HOURS AS NEEDED FOR MUSCLE SPASMS 30 tablet 2  . Cholecalciferol 2000 units TABS Take 1 tablet (2,000 Units total) by mouth daily. 90 tablet 1  . cyclobenzaprine (FEXMID) 7.5 MG tablet Take 1 tablet (7.5 mg total) by mouth 3 (three) times daily as needed for muscle spasms. 30 tablet 2  . dexamethasone (DECADRON) 4 MG tablet     . dronabinol (MARINOL) 2.5 MG capsule Take 1 capsule (2.5 mg total) by mouth 2 (two) times daily before a meal. 60 capsule 0  . Lancets (ONETOUCH DELICA PLUS [id number]) MISC     . lidocaine (XYLOCAINE) 2 % solution     . loperamide (IMODIUM A-D) 2 MG tablet Take 2 tablets (4 mg total) by mouth 4 (four) times daily as needed. Take 2 at diarrhea onset , then 1 every 2hr until 12hrs with no BM. May take 2 every 4hrs at night. If diarrhea recurs repeat. 100 tablet 1  . magic mouthwash SOLN Swish and swallow equal parts Benadryl, Lidocaine and Nystatin.  5- 10 ml.'s four times a day as needed. 240 mL 0  . meclizine (ANTIVERT) 12.5 MG tablet Take 1 tablet (12.5 mg total) by mouth 3 (three) times daily as needed for dizziness. 30 tablet 0  . metoprolol tartrate (LOPRESSOR) 25 MG tablet TAKE 1/2 TABLET(12.5 MG) BY MOUTH TWICE DAILY 60 tablet 1  . olmesartan-hydrochlorothiazide (BENICAR HCT) 20-12.5 MG tablet TK 1 T PO D    . traMADol (ULTRAM) 50 MG tablet Take 1 tablet (50 mg total) by mouth every 6 (six) hours as needed. 30 tablet 0  . sitaGLIPtin (JANUVIA) 50 MG tablet Take 1 tablet (50 mg total) by mouth daily. (Patient not taking: Reported on 03/12/2019) 90 tablet 3  . [DISCONTINUED] prochlorperazine (COMPAZINE) 10 MG tablet Take 1 tablet (10 mg total) by mouth every 6 (six) hours as needed (Nausea or vomiting). 30 tablet 1    No current facility-administered medications on file prior to visit.     Allergies  Allergen Reactions  . Hydrocodone Swelling and Other (See Comments)    "I started swelling, became red, and passed out"  . Iohexol Hives, Shortness Of Breath and Other (See Comments)    Patient developed hives and  fullness in throat post injection of 125cc's Omni 300, Onset Date: 11/15/2006   . Naproxen Other (See Comments)    "It made me feel out of my head"  . Ibuprofen Nausea Only  . Propoxyphene N-Acetaminophen Other (See Comments)    "I couldn't find the door to make my way out of the room- I was in misery"    Family History  Problem Relation Age of Onset  . Cirrhosis Mother   . Heart disease Father   . Cancer Other   . Hypertension Other   . Diabetes Other     BP (!) 140/92 (BP Location: Right Arm, Patient Position: Sitting, Cuff Size: Normal)   Pulse 74   Ht 5\' 11"  (1.803 m)   Wt 181 lb (82.1 kg)   SpO2 96%   BMI 25.24 kg/m    Review of Systems She denies hypoglycemia.      Objective:   Physical Exam VITAL SIGNS:  See vs page GENERAL: no distress.  In wheelchair.   Pulses: dorsalis pedis intact bilat.   MSK: no deformity of the feet CV: 1+ left leg edema (trace on the right) Skin:  no ulcer on the feet.  normal color and temp on the feet. Neuro: sensation is intact to touch on the feet  Lab Results  Component Value Date   HGBA1C 6.1 (A) 03/12/2019    Lab Results  Component Value Date   CREATININE 0.77 03/03/2019   BUN 14 03/03/2019   NA 143 03/03/2019   K 3.8 03/03/2019   CL 106 03/03/2019   CO2 29 03/03/2019       Assessment & Plan:  Insulin-requiring type 2 DM, with CVA: overcontrolled, given this regimen, which does match insulin to her changing needs throughout the day colon cancer: she needs to continue to adjust insulin for chemotx HTN: recheck next time.  Patient Instructions  Please reduce the [place] to 24 units daily.  However, take 28 on each  chemo day.   Please come back for a follow-up appointment in 2 months.   You can stop taking the [place], to same money. Please continue the same medications for blood pressure, for now.  check your blood sugar twice a day.  vary the time of day when you check, between before the 3 meals, and at bedtime.  also check if you have symptoms of your blood sugar being too high or too low.  please keep a record of the readings and bring it to your next appointment here (or you can bring the meter itself).  You can write it on any piece of paper.  please call us sooner if your blood sugar goes below 70, or if you have a lot of readings over 200.

## 2019-03-12 NOTE — Patient Instructions (Addendum)
Please reduce the Tresiba to 24 units daily.  However, take 28 on each chemo day.   Please come back for a follow-up appointment in 2 months.   You can stop taking the [place], to same money. Please continue the same medications for blood pressure, for now.  check your blood sugar twice a day.  vary the time of day when you check, between before the 3 meals, and at bedtime.  also check if you have symptoms of your blood sugar being too high or too low.  please keep a record of the readings and bring it to your next appointment here (or you can bring the meter itself).  You can write it on any piece of paper.  please call us sooner if your blood sugar goes below 70, or if you have a lot of readings over 200.

## 2019-03-14 ENCOUNTER — Telehealth: Payer: Self-pay | Admitting: Endocrinology

## 2019-03-14 NOTE — Telephone Encounter (Signed)
MEDICATION: Tonya [name]  PHARMACY:  [facility] in [place] A 90 DAY SUPPLY :   IS PATIENT OUT OF MEDICATION:   IF NOT; HOW MUCH IS LEFT:   LAST APPOINTMENT DATE: @11 /18/2020  NEXT APPOINTMENT DATE:@1 /21/2021  DO WE HAVE YOUR PERMISSION TO LEAVE A DETAILED MESSAGE: yes  OTHER COMMENTS: patient only picked up a partial fill of this medication due to financial concerns.  Patient is able to pick up balance of RX or full refill at this time, but per the pharmacy in order for her to get the medication filled now she will need a new RX sent over   **Let patient know to contact pharmacy at the end of the day to make sure medication is ready. **  ** Please notify patient to allow 48-72 hours to process**  **Encourage patient to contact the pharmacy for refills or they can request refills through [facility] [address]**

## 2019-03-16 DIAGNOSIS — C187 Malignant neoplasm of sigmoid colon: Secondary | ICD-10-CM | POA: Diagnosis not present

## 2019-03-17 ENCOUNTER — Inpatient Hospital Stay: Payer: Medicare HMO

## 2019-03-17 ENCOUNTER — Other Ambulatory Visit: Payer: Self-pay

## 2019-03-17 ENCOUNTER — Inpatient Hospital Stay (HOSPITAL_BASED_OUTPATIENT_CLINIC_OR_DEPARTMENT_OTHER): Payer: Medicare HMO | Admitting: Hematology & Oncology

## 2019-03-17 ENCOUNTER — Encounter: Payer: Self-pay | Admitting: Hematology & Oncology

## 2019-03-17 VITALS — BP 153/95 | HR 90 | Temp 97.7°F | Resp 18 | Wt 174.0 lb

## 2019-03-17 DIAGNOSIS — I10 Essential (primary) hypertension: Secondary | ICD-10-CM | POA: Diagnosis not present

## 2019-03-17 DIAGNOSIS — C787 Secondary malignant neoplasm of liver and intrahepatic bile duct: Secondary | ICD-10-CM | POA: Diagnosis not present

## 2019-03-17 DIAGNOSIS — Z8673 Personal history of transient ischemic attack (TIA), and cerebral infarction without residual deficits: Secondary | ICD-10-CM | POA: Diagnosis not present

## 2019-03-17 DIAGNOSIS — E1149 Type 2 diabetes mellitus with other diabetic neurological complication: Secondary | ICD-10-CM

## 2019-03-17 DIAGNOSIS — Z5111 Encounter for antineoplastic chemotherapy: Secondary | ICD-10-CM | POA: Diagnosis not present

## 2019-03-17 DIAGNOSIS — I493 Ventricular premature depolarization: Secondary | ICD-10-CM

## 2019-03-17 DIAGNOSIS — C189 Malignant neoplasm of colon, unspecified: Secondary | ICD-10-CM

## 2019-03-17 DIAGNOSIS — D509 Iron deficiency anemia, unspecified: Secondary | ICD-10-CM | POA: Diagnosis not present

## 2019-03-17 DIAGNOSIS — C182 Malignant neoplasm of ascending colon: Secondary | ICD-10-CM | POA: Diagnosis not present

## 2019-03-17 DIAGNOSIS — Z7982 Long term (current) use of aspirin: Secondary | ICD-10-CM | POA: Diagnosis not present

## 2019-03-17 DIAGNOSIS — Z794 Long term (current) use of insulin: Secondary | ICD-10-CM

## 2019-03-17 DIAGNOSIS — Z79899 Other long term (current) drug therapy: Secondary | ICD-10-CM | POA: Diagnosis not present

## 2019-03-17 DIAGNOSIS — Z5112 Encounter for antineoplastic immunotherapy: Secondary | ICD-10-CM | POA: Diagnosis not present

## 2019-03-17 DIAGNOSIS — C187 Malignant neoplasm of sigmoid colon: Secondary | ICD-10-CM | POA: Diagnosis not present

## 2019-03-17 LAB — CBC WITH DIFFERENTIAL (CANCER CENTER ONLY)
Abs Immature Granulocytes: 0.01 10*3/uL (ref 0.00–0.07)
Basophils Absolute: 0 10*3/uL (ref 0.0–0.1)
Basophils Relative: 0 %
Eosinophils Absolute: 0 10*3/uL (ref 0.0–0.5)
Eosinophils Relative: 0 %
HCT: 38.9 % (ref 36.0–46.0)
Hemoglobin: 12.4 g/dL (ref 12.0–15.0)
Immature Granulocytes: 0 %
Lymphocytes Relative: 27 %
Lymphs Abs: 1.2 10*3/uL (ref 0.7–4.0)
MCH: 29.2 pg (ref 26.0–34.0)
MCHC: 31.9 g/dL (ref 30.0–36.0)
MCV: 91.7 fL (ref 80.0–100.0)
Monocytes Absolute: 0.5 10*3/uL (ref 0.1–1.0)
Monocytes Relative: 11 %
Neutro Abs: 2.7 10*3/uL (ref 1.7–7.7)
Neutrophils Relative %: 62 %
Platelet Count: 188 10*3/uL (ref 150–400)
RBC: 4.24 MIL/uL (ref 3.87–5.11)
RDW: 13.5 % (ref 11.5–15.5)
WBC Count: 4.5 10*3/uL (ref 4.0–10.5)
nRBC: 0 % (ref 0.0–0.2)

## 2019-03-17 LAB — CMP (CANCER CENTER ONLY)
ALT: 17 U/L (ref 0–44)
AST: 21 U/L (ref 15–41)
Albumin: 4.1 g/dL (ref 3.5–5.0)
Alkaline Phosphatase: 70 U/L (ref 38–126)
Anion gap: 9 (ref 5–15)
BUN: 12 mg/dL (ref 8–23)
CO2: 30 mmol/L (ref 22–32)
Calcium: 9.9 mg/dL (ref 8.9–10.3)
Chloride: 104 mmol/L (ref 98–111)
Creatinine: 0.89 mg/dL (ref 0.44–1.00)
GFR, Est AFR Am: >60 mL/min (ref >60)
GFR, Estimated: >60 mL/min (ref >60)
Glucose, Bld: 167 mg/dL — ABNORMAL HIGH (ref 70–99)
Potassium: 3.8 mmol/L (ref 3.5–5.1)
Sodium: 143 mmol/L (ref 135–145)
Total Bilirubin: 0.8 mg/dL (ref 0.3–1.2)
Total Protein: 6.5 g/dL (ref 6.5–8.1)

## 2019-03-17 LAB — CEA (IN HOUSE-CHCC): CEA (CHCC-In House): 2.43 ng/mL (ref 0.00–5.00)

## 2019-03-17 MED ORDER — SODIUM CHLORIDE 0.9 % IV SOLN
400.0000 mg/m2 | Freq: Once | INTRAVENOUS | Status: AC
  Administered 2019-03-17: 784 mg via INTRAVENOUS
  Filled 2019-03-17: qty 39.2

## 2019-03-17 MED ORDER — DEXAMETHASONE SODIUM PHOSPHATE 10 MG/ML IJ SOLN
INTRAMUSCULAR | Status: AC
  Filled 2019-03-17: qty 1

## 2019-03-17 MED ORDER — SODIUM CHLORIDE 0.9 % IV SOLN
1920.0000 mg/m2 | INTRAVENOUS | Status: DC
  Administered 2019-03-17: 3750 mg via INTRAVENOUS
  Filled 2019-03-17: qty 75

## 2019-03-17 MED ORDER — TRESIBA FLEXTOUCH 100 UNIT/ML ~~LOC~~ SOPN: 24.0000 [IU] | PEN_INJECTOR | Freq: Every day | SUBCUTANEOUS | 11 refills | Status: DC

## 2019-03-17 MED ORDER — PALONOSETRON HCL INJECTION 0.25 MG/5ML
0.2500 mg | Freq: Once | INTRAVENOUS | Status: AC
  Administered 2019-03-17: 0.25 mg via INTRAVENOUS

## 2019-03-17 MED ORDER — TEMAZEPAM 7.5 MG PO CAPS: 7.5000 mg | ORAL_CAPSULE | Freq: Every evening | ORAL | 0 refills | Status: DC | PRN

## 2019-03-17 MED ORDER — SODIUM CHLORIDE 0.9 % IV SOLN
Freq: Once | INTRAVENOUS | Status: AC
  Administered 2019-03-17: 11:00:00 via INTRAVENOUS
  Filled 2019-03-17: qty 250

## 2019-03-17 MED ORDER — PALONOSETRON HCL INJECTION 0.25 MG/5ML
INTRAVENOUS | Status: AC
  Filled 2019-03-17: qty 5

## 2019-03-17 MED ORDER — HEPARIN SOD (PORK) LOCK FLUSH 100 UNIT/ML IV SOLN
500.0000 [IU] | Freq: Once | INTRAVENOUS | Status: DC | PRN
  Filled 2019-03-17: qty 5

## 2019-03-17 MED ORDER — FLUOROURACIL CHEMO INJECTION 2.5 GM/50ML
320.0000 mg/m2 | Freq: Once | INTRAVENOUS | Status: AC
  Administered 2019-03-17: 650 mg via INTRAVENOUS
  Filled 2019-03-17: qty 13

## 2019-03-17 MED ORDER — SODIUM CHLORIDE 0.9 % IV SOLN
5.0000 mg/kg | Freq: Once | INTRAVENOUS | Status: AC
  Administered 2019-03-17: 400 mg via INTRAVENOUS
  Filled 2019-03-17: qty 16

## 2019-03-17 MED ORDER — SODIUM CHLORIDE 0.9 % IV SOLN
135.0000 mg/m2 | Freq: Once | INTRAVENOUS | Status: AC
  Administered 2019-03-17: 260 mg via INTRAVENOUS
  Filled 2019-03-17: qty 13

## 2019-03-17 MED ORDER — METOPROLOL TARTRATE 25 MG PO TABS: 25.0000 mg | ORAL_TABLET | Freq: Two times a day (BID) | ORAL | 1 refills | Status: DC

## 2019-03-17 MED ORDER — SODIUM CHLORIDE 0.9% FLUSH
10.0000 mL | INTRAVENOUS | Status: DC | PRN
  Filled 2019-03-17: qty 10

## 2019-03-17 MED ORDER — DEXAMETHASONE SODIUM PHOSPHATE 10 MG/ML IJ SOLN
10.0000 mg | Freq: Once | INTRAMUSCULAR | Status: AC
  Administered 2019-03-17: 10 mg via INTRAVENOUS

## 2019-03-17 NOTE — Progress Notes (Signed)
Hematology and Oncology Follow Up Visit  Tonya [name] [id number] 1941/02/16 78 y.o. 03/17/2019   Principle Diagnosis:  Stage IV adenocarcinoma of the ascending colon -- NRAS+, BRAF-, HER2-, MSI/MMR -,  Iron deficiency anemia  Past Therapy: FOLFOX - s/p cycle 4  Current Therapy:   FOLFIRI/Bevacizumab -started 01/20/2019, s/p cycle #4 IV Iron as indicated   Interim History:  Tonya [name] is here today with her daughter for follow-up.   She actually is doing pretty well.  She still has is pain over on the right side.  This seems to worsen whenever she moves.  This might be some type of muscular strain.  I am not sure what else we can really do regarding this.  I think that treatment is working.  Her CEA has come down quite nicely.  We will refer start her treatments, the CEA was 10.5.  Her last CEA level back in October was 2.8.  Her appetite is doing okay.  She has had no mouth sores.  She is eating a little bit better.  She is on appetite stimulants.  She is not sleeping all that well.  I will try her on some Restoril to see if this might help.  She has had no bleeding.  There has been no leg swelling.  Her blood pressure is still on the high side.  We need to increase her metoprolol to 25 mg p.o. twice daily.  We will have to set her up with some scans after this cycle of treatment.  Again, I have to believe that treatment is working.  Overall, her performance status is ECOG 1-2.  Medications:  Allergies as of 03/17/2019      Reactions   Hydrocodone Swelling, Other (See Comments)   "I started swelling, became red, and passed out"   Iohexol Hives, Shortness Of Breath, Other (See Comments)   Patient developed hives and fullness in throat post injection of 125cc's Omni 300, Onset Date: 11/15/2006   Naproxen Other (See Comments)   "It made me feel out of my head"   Ibuprofen Nausea Only   Propoxyphene N-acetaminophen Other (See Comments)   "I couldn't find the door to  make my way out of the room- I was in misery"      Medication List       Accurate as of March 17, 2019 11:12 AM. If you have any questions, ask your nurse or doctor.        amLODipine 10 MG tablet Commonly known as: NORVASC Take 1 tablet (10 mg total) by mouth daily.   aspirin EC 81 MG tablet Take 81 mg by mouth daily.   atorvastatin 10 MG tablet Commonly known as: LIPITOR TAKE 1 TABLET(10 MG) BY MOUTH DAILY What changed:   how much to take  how to take this  when to take this  additional instructions   baclofen 10 MG tablet Commonly known as: LIORESAL TAKE 1/2 TABLET(5 MG) BY MOUTH EVERY 8 HOURS AS NEEDED FOR MUSCLE SPASMS   Cholecalciferol 50 MCG (2000 UT) Tabs Take 1 tablet (2,000 Units total) by mouth daily.   cyclobenzaprine 7.5 MG tablet Commonly known as: FEXMID Take 1 tablet (7.5 mg total) by mouth 3 (three) times daily as needed for muscle spasms.   dexamethasone 4 MG tablet Commonly known as: DECADRON   dronabinol 2.5 MG capsule Commonly known as: MARINOL Take 1 capsule (2.5 mg total) by mouth 2 (two) times daily before a meal.   lidocaine 2 % solution  Commonly known as: XYLOCAINE   loperamide 2 MG tablet Commonly known as: Imodium A-D Take 2 tablets (4 mg total) by mouth 4 (four) times daily as needed. Take 2 at diarrhea onset , then 1 every 2hr until 12hrs with no BM. May take 2 every 4hrs at night. If diarrhea recurs repeat.   magic mouthwash Soln Swish and swallow equal parts Benadryl, Lidocaine and Nystatin.  5- 10 ml.'s four times a day as needed.   meclizine 12.5 MG tablet Commonly known as: ANTIVERT Take 1 tablet (12.5 mg total) by mouth 3 (three) times daily as needed for dizziness.   metoprolol tartrate 25 MG tablet Commonly known as: LOPRESSOR TAKE 1/2 TABLET(12.5 MG) BY MOUTH TWICE DAILY   olmesartan-hydrochlorothiazide 20-12.5 MG tablet Commonly known as: BENICAR HCT TK 1 T PO D   OneTouch Delica Plus [id number] Misc    sitaGLIPtin 50 MG tablet Commonly known as: JANUVIA Take 1 tablet (50 mg total) by mouth daily.   traMADol 50 MG tablet Commonly known as: ULTRAM Take 1 tablet (50 mg total) by mouth every 6 (six) hours as needed.   Tyler Aas FlexTouch 100 UNIT/ML Sopn FlexTouch Pen Generic drug: insulin degludec Inject 0.24 mLs (24 Units total) into the skin daily.       Allergies:  Allergies  Allergen Reactions  . Hydrocodone Swelling and Other (See Comments)    "I started swelling, became red, and passed out"  . Iohexol Hives, Shortness Of Breath and Other (See Comments)    Patient developed hives and fullness in throat post injection of 125cc's Omni 300, Onset Date: 11/15/2006   . Naproxen Other (See Comments)    "It made me feel out of my head"  . Ibuprofen Nausea Only  . Propoxyphene N-Acetaminophen Other (See Comments)    "I couldn't find the door to make my way out of the room- I was in misery"    Past Medical History, Surgical history, Social history, and Family History were reviewed and updated.  Review of Systems: Review of Systems  Constitutional: Negative.   HENT: Negative.   Eyes: Negative.   Respiratory: Negative.   Cardiovascular: Negative.   Gastrointestinal: Positive for abdominal pain.  Genitourinary: Negative.   Musculoskeletal: Positive for back pain.  Skin: Negative.   Neurological: Negative.   Endo/Heme/Allergies: Negative.   Psychiatric/Behavioral: The patient has insomnia.       Physical Exam:  weight is 174 lb (78.9 kg). Her temporal temperature is 97.7 F (36.5 C). Her blood pressure is 153/95 (abnormal) and her pulse is 90. Her respiration is 18 and oxygen saturation is 100%.   Wt Readings from Last 3 Encounters:  03/17/19 174 lb (78.9 kg)  03/12/19 181 lb (82.1 kg)  03/03/19 176 lb (79.8 kg)    Physical Exam Vitals signs reviewed.  HENT:     Head: Normocephalic and atraumatic.  Eyes:     Pupils: Pupils are equal, round, and reactive to light.   Neck:     Musculoskeletal: Normal range of motion.  Cardiovascular:     Rate and Rhythm: Normal rate and regular rhythm.     Heart sounds: Normal heart sounds.  Pulmonary:     Effort: Pulmonary effort is normal.     Breath sounds: Normal breath sounds.  Abdominal:     General: Bowel sounds are normal.     Palpations: Abdomen is soft.     Comments: Abdominal exam is soft.  She has good bowel sounds.  There is no fluid wave.  There is no palpable liver or spleen tip.  Musculoskeletal: Normal range of motion.        General: No tenderness or deformity.  Lymphadenopathy:     Cervical: No cervical adenopathy.  Skin:    General: Skin is warm and dry.     Findings: No erythema or rash.  Neurological:     Mental Status: She is alert and oriented to person, place, and time.     Comments: She has chronic weakness on her left side.  This is from a past CVA.  Psychiatric:        Behavior: Behavior normal.        Thought Content: Thought content normal.        Judgment: Judgment normal.      Lab Results  Component Value Date   WBC 4.5 03/17/2019   HGB 12.4 03/17/2019   HCT 38.9 03/17/2019   MCV 91.7 03/17/2019   PLT 188 03/17/2019   Lab Results  Component Value Date   FERRITIN 313 (H) 03/03/2019   IRON 60 03/03/2019   TIBC 291 03/03/2019   UIBC 231 03/03/2019   IRONPCTSAT 21 03/03/2019   Lab Results  Component Value Date   RETICCTPCT 1.5 05/17/2016   RBC 4.24 03/17/2019   RETICCTABS 62,400 05/17/2016   No results found for: KPAFRELGTCHN, LAMBDASER, KAPLAMBRATIO No results found for: IGGSERUM, IGA, IGMSERUM No results found for: Odetta Pink, SPEI   Chemistry      Component Value Date/Time   NA 143 03/17/2019 0925   NA 139 09/30/2018 1311   K 3.8 03/17/2019 0925   CL 104 03/17/2019 0925   CO2 30 03/17/2019 0925   BUN 12 03/17/2019 0925   BUN 10 09/30/2018 1311   CREATININE 0.89 03/17/2019 0925      Component  Value Date/Time   CALCIUM 9.9 03/17/2019 0925   ALKPHOS 70 03/17/2019 0925   AST 21 03/17/2019 0925   ALT 17 03/17/2019 0925   BILITOT 0.8 03/17/2019 0925       Impression and Plan: Ms. Negrette is a very pleasant 78 yo African American female with metastatic colon cancer.   I really think that the key to her success with this protocol is at adding the Avastin.  Her blood pressure is on the high side.  Her heart rate is little bit on the high side.  Again we will increase her metoprolol up to 25 mg p.o. twice daily.  We will get her set up with another scan.  We will get the CT scan set up in 2 weeks.  I am just glad that her quality life is doing well and that she will enjoyed a nice Thanksgiving.  As always, we had a very good prayer session.  Her faith remains very strong.   [name], MD 11/23/202011:12 AM

## 2019-03-17 NOTE — Patient Instructions (Signed)
[facility] Discharge Instructions for Patients Receiving Chemotherapy  Today you received the following chemotherapy agents Irinotecan, Avastin, 5FU  To help prevent nausea and vomiting after your treatment, we encourage you to take your nausea medication    If you develop nausea and vomiting that is not controlled by your nausea medication, call the clinic.   BELOW ARE SYMPTOMS THAT SHOULD BE REPORTED IMMEDIATELY:  *FEVER GREATER THAN 100.5 F  *CHILLS WITH OR WITHOUT FEVER  NAUSEA AND VOMITING THAT IS NOT CONTROLLED WITH YOUR NAUSEA MEDICATION  *UNUSUAL SHORTNESS OF BREATH  *UNUSUAL BRUISING OR BLEEDING  TENDERNESS IN MOUTH AND THROAT WITH OR WITHOUT PRESENCE OF ULCERS  *URINARY PROBLEMS  *BOWEL PROBLEMS  UNUSUAL RASH Items with * indicate a potential emergency and should be followed up as soon as possible.  Feel free to call the clinic should you have any questions or concerns. The clinic phone number is [phone].  Please show the [place] at check-in to the Emergency Department and triage nurse.

## 2019-03-17 NOTE — Patient Instructions (Signed)

## 2019-03-17 NOTE — Telephone Encounter (Signed)
insulin degludec (TRESIBA FLEXTOUCH) 100 UNIT/ML SOPN FlexTouch Pen 5 pen 11 03/17/2019    Sig - Route: Inject 0.24 mLs (24 Units total) into the skin daily. - Subcutaneous   Sent to pharmacy as: insulin degludec (TRESIBA FLEXTOUCH) 100 UNIT/ML Solution Pen-injector FlexTouch Pen   E-Prescribing Status: Receipt confirmed by pharmacy (03/17/2019 7:22 AM EST)

## 2019-03-18 ENCOUNTER — Ambulatory Visit: Payer: Medicare HMO | Admitting: Family Medicine

## 2019-03-19 ENCOUNTER — Inpatient Hospital Stay: Payer: Medicare HMO

## 2019-03-19 ENCOUNTER — Other Ambulatory Visit: Payer: Self-pay

## 2019-03-19 VITALS — BP 170/83 | HR 65 | Temp 97.0°F | Resp 18

## 2019-03-19 DIAGNOSIS — Z5111 Encounter for antineoplastic chemotherapy: Secondary | ICD-10-CM | POA: Diagnosis not present

## 2019-03-19 DIAGNOSIS — Z8673 Personal history of transient ischemic attack (TIA), and cerebral infarction without residual deficits: Secondary | ICD-10-CM | POA: Diagnosis not present

## 2019-03-19 DIAGNOSIS — C189 Malignant neoplasm of colon, unspecified: Secondary | ICD-10-CM

## 2019-03-19 DIAGNOSIS — Z7982 Long term (current) use of aspirin: Secondary | ICD-10-CM | POA: Diagnosis not present

## 2019-03-19 DIAGNOSIS — Z5112 Encounter for antineoplastic immunotherapy: Secondary | ICD-10-CM | POA: Diagnosis not present

## 2019-03-19 DIAGNOSIS — C182 Malignant neoplasm of ascending colon: Secondary | ICD-10-CM | POA: Diagnosis not present

## 2019-03-19 DIAGNOSIS — D509 Iron deficiency anemia, unspecified: Secondary | ICD-10-CM | POA: Diagnosis not present

## 2019-03-19 DIAGNOSIS — Z79899 Other long term (current) drug therapy: Secondary | ICD-10-CM | POA: Diagnosis not present

## 2019-03-19 MED ORDER — SODIUM CHLORIDE 0.9% FLUSH
10.0000 mL | INTRAVENOUS | Status: DC | PRN
  Administered 2019-03-19: 10 mL
  Filled 2019-03-19: qty 10

## 2019-03-19 MED ORDER — HEPARIN SOD (PORK) LOCK FLUSH 100 UNIT/ML IV SOLN
500.0000 [IU] | Freq: Once | INTRAVENOUS | Status: AC | PRN
  Administered 2019-03-19: 500 [IU]
  Filled 2019-03-19: qty 5

## 2019-03-25 ENCOUNTER — Other Ambulatory Visit: Payer: Self-pay

## 2019-03-25 ENCOUNTER — Telehealth: Payer: Self-pay | Admitting: Endocrinology

## 2019-03-25 ENCOUNTER — Ambulatory Visit (INDEPENDENT_AMBULATORY_CARE_PROVIDER_SITE_OTHER): Payer: Medicare HMO | Admitting: Family Medicine

## 2019-03-25 ENCOUNTER — Other Ambulatory Visit: Payer: Self-pay | Admitting: Family Medicine

## 2019-03-25 ENCOUNTER — Encounter: Payer: Self-pay | Admitting: Family Medicine

## 2019-03-25 VITALS — BP 152/88 | HR 78 | Temp 98.0°F | Ht 71.0 in | Wt 179.0 lb

## 2019-03-25 DIAGNOSIS — I493 Ventricular premature depolarization: Secondary | ICD-10-CM

## 2019-03-25 DIAGNOSIS — E785 Hyperlipidemia, unspecified: Secondary | ICD-10-CM | POA: Diagnosis not present

## 2019-03-25 DIAGNOSIS — M5416 Radiculopathy, lumbar region: Secondary | ICD-10-CM

## 2019-03-25 DIAGNOSIS — I10 Essential (primary) hypertension: Secondary | ICD-10-CM

## 2019-03-25 MED ORDER — OLMESARTAN MEDOXOMIL 20 MG PO TABS: 20.0000 mg | ORAL_TABLET | Freq: Every day | ORAL | 1 refills | Status: DC

## 2019-03-25 MED ORDER — METOPROLOL TARTRATE 25 MG PO TABS: 25.0000 mg | ORAL_TABLET | Freq: Two times a day (BID) | ORAL | 1 refills | Status: DC

## 2019-03-25 NOTE — Telephone Encounter (Signed)
Patient is trying to pick up the [place] and lancets from the [facility], [place] in [place], [place] - They are unable to pick up per [employer] because insurance states that it is too soon.Patient had picked up just pens prior and is back to fill remainder of RX.  Pharmacy # [phone]  This is the patient Tonya [name] called you about

## 2019-03-25 NOTE — Telephone Encounter (Signed)
I do not recall any conversations about this pt,please clarify

## 2019-03-25 NOTE — Telephone Encounter (Signed)
Spoke to pharmacy, they confirm she picked up 2 pens on 03/08/2019 which based on dose is a 40 day supply. Since she only picked up the 2 pens her insurance will not pay for the remaninig 3 pens until 04/07/2019.  I let the patient's daughter know this information and she said her mother will run out before Dec 14. I let her know we can give her 2 Tresiba samples and she will pick them up tomorrow.

## 2019-03-25 NOTE — Patient Instructions (Addendum)
Take metoprolol 25 mg twice a day  Start Benicar (NO HCTZ) daily  Take blood pressure first thing in the morning  Continue to check fasting glucose readings and 2 hours after your biggest meal several times a week-Take to endocrinology   Baclofen-muscle relaxer-take 1/2 at night for muscle tightness-FALL RISK

## 2019-03-25 NOTE — Progress Notes (Signed)
New Patient Office Visit  Subjective:  Patient ID: Tonya [name], female    DOB: 21-May-1940  Age: 78 y.o. MRN: [id number]  CC:  Chief Complaint  Patient presents with  . Establish Care  HTN-elevated at oncology-pt with treatment for colon cancer-chem since cancer identified on colonoscopy. Pt fell 3 weeks ago and hit the ribs on the right side -tenderness with moving and breathing.  HPI  [name] presents for HTN-increased metoprolol to BID 25mg  and took pt off HCTZ at oncology office-Benicar being used  for treatment Blood pressure at home readings normal range DM-well controlled on Tresiba-seen by endo for managment  COPD-no inhalers  DM-Dr. Elam Dutch longer taking Januvia, continues Tresiba 24 units at night   Cardiac Studies:  Holter Monitor for 24 hours  10/09/2016:  Normal sinus rhythm, sinus bradycardia and sinus tachycardia with heart rate ranging from 45-121bpm with average heart rate 69bpm.  Frequent PVCs, bigeminal PVCs, salvos and nonsustained ventricular tachycardia up to 5 beats in a ros  PVC load was 20%.  Occasional PACs and nonsustained atrial tachycardia up to 7 beats in a row.  Event monitor 30 days 07/30/2018: Predominant rhythm is normal sinus rhythm.  There were 3 runs of NSVT, maximum 13 beats, minimum 3 beats, asymptomatic.  Frequent PVCs, ventricular ectopic burden 5%.  There are occasional PACs.  Echocardiogram 08/28/2018: Left ventricle cavity is normal in size. Moderate concentric hypertrophy of the left ventricle. Normal global wall motion. Doppler evidence of grade II (pseudonormal) diastolic dysfunction, elevated LAP. Calculated EF 53%. Mild (Grade I) mitral regurgitation. Mild tricuspid regurgitation. Estimated pulmonary artery systolic pressure 26 mmHg. Mild pulmonic regurgitation. Compared to 10/09/16, previously mild LVH.   Lexiscan Myoview stress test 10/08/2018: Lexiscan stress test was performed. Stress EKG is  non-diagnostic, as this is pharmacological stress test. Myocardial pefusion imaging is normal. LVEF 65%. Low risk study.  No significant change from  10/09/2016.  Assessment   1. Frequent PVCs   2. NSVT (nonsustained ventricular tachycardia) ([place])   3. Dyspnea on exertion   4. Essential hypertension    EKG 05/31/2018: Sinus rhythm with first-degree AV block at rate of 56 bpm, left axis deviation, left anterior fascicular block.  Cannot exclude inferior infarct old, anteroseptal infarct old.  No evidence of ischemia.  EKG 09/19/2016: Normal sinus rhythm at the rate of 85 bpm, left axis deviation, poor R-wave progression, frequent PVCs in the form of ventricular bigeminy, ventricular couplets and triplets.  Recommendations:Dr. Einar Gip   I have reviewed the results of the stress test and echocardiogram with the patient.  She has low risk stress test.  So reviewed her hospital records, she has a new diagnosis of colonic cancer.  She had refused surgery, advised her that from the cardiac standpoint she would be low risk for proceeding with surgery.  She appears motivated now and is willing to consider surgery.  Dyspnea has remained stable, probably related to deconditioning and also underlying anemia.  Blood pressure is now stable and well-controlled on the present medications.  With regard to dizziness, suspect it may have been related to underlying colonic cancer as there was no orthostasis, she has not had any significant arrhythmias except for one episode of nonsustained VT on event monitor without any heart block.  Due to her age AV nodal disease is still a possibility but does not appear to be high on the list.  Hence advised continued observation for now and work-up for colonic cancer  will be most appropriate.  Otherwise from cardiac standpoint she remained stable, I will see her back in a year.   Past Medical History:  Diagnosis Date  . Cerebrovascular accident ([facility])   . Chronic  edema    ? venous insufficiency  . COPD (chronic obstructive pulmonary disease) ([facility])    never had a problem breathing  . Diabetes mellitus without complication ([facility])   . Enlarged heart    per pt  . Gallstones   . GERD (gastroesophageal reflux disease)   . Goals of care, counseling/discussion 11/01/2018  . Hyperlipidemia   . Hypertension     Past Surgical History:  Procedure Laterality Date  . ABDOMINAL HYSTERECTOMY    . BIOPSY  10/18/2018   Procedure: BIOPSY;  Surgeon: Jackquline Denmark, MD;  Location[facility] ENDOSCOPY;  Service: Endoscopy;;  . CHOLECYSTECTOMY    . COLONOSCOPY WITH PROPOFOL N/A 10/18/2018   Procedure: COLONOSCOPY WITH PROPOFOL;  Surgeon: Jackquline Denmark, MD;  Location: [facility] ENDOSCOPY;  Service: Endoscopy;  Laterality: N/A;  . IR IMAGING GUIDED PORT INSERTION  11/11/2018  . POLYPECTOMY  10/18/2018   Procedure: POLYPECTOMY;  Surgeon: Jackquline Denmark, MD;  Location: [facility] ENDOSCOPY;  Service: Endoscopy;;  . SUBMUCOSAL TATTOO INJECTION  10/18/2018   Procedure: SUBMUCOSAL TATTOO INJECTION;  Surgeon: Jackquline Denmark, MD;  Location: [facility] ENDOSCOPY;  Service: Endoscopy;;    Family History  Problem Relation Age of Onset  . Cirrhosis Mother   . Heart disease Father   . Cancer Other   . Hypertension Other   . Diabetes Other     Social History   Socioeconomic History  . Marital status: Widowed    Spouse name: Not on file  . Number of children: 5  . Years of education: Not on file  . Highest education level: Not on file  Occupational History  . Not on file  Social Needs  . Financial resource strain: Not on file  . Food insecurity    Worry: Not on file    Inability: Not on file  . Transportation needs    Medical: Not on file    Non-medical: Not on file  Tobacco Use  . Smoking status: Former Smoker    Packs/day: 0.25    Years: 1.00    Pack years: 0.25    Quit date: 07/23/1968    Years since quitting: 50.7  . Smokeless tobacco: Never Used  Substance and Sexual Activity  .  Alcohol use: No    Alcohol/week: 0.0 standard drinks  . Drug use: No  . Sexual activity: Not Currently  Lifestyle  . Physical activity    Days per week: Not on file    Minutes per session: Not on file  . Stress: Not on file  Relationships  . Social Herbalist on phone: Not on file    Gets together: Not on file    Attends religious service: Not on file    Active member of club or organization: Not on file    Attends meetings of clubs or organizations: Not on file    Relationship status: Not on file  . Intimate partner violence    Fear of current or ex partner: Not on file    Emotionally abused: Not on file    Physically abused: Not on file    Forced sexual activity: Not on file  Other Topics Concern  . Not on file  Social History Narrative  . Not on file    ROS Review of Systems  Constitutional: Negative for fever.  Respiratory: Positive for chest tightness.   Cardiovascular: Positive for leg swelling. Negative for chest pain and palpitations.  Gastrointestinal:       Colon cancer-chemo currently  Endocrine: Negative.   Skin: Negative.   Allergic/Immunologic: Negative.     Objective:   Today's Vitals: BP (!) 152/88 (BP Location: Right Arm, Patient Position: Sitting, Cuff Size: Normal)   Pulse 78   Temp 98 F (36.7 C) (Oral)   Ht 5\' 11"  (1.803 m)   Wt 179 lb (81.2 kg)   SpO2 97%   BMI 24.97 kg/m   Physical Exam Constitutional:      Appearance: Normal appearance.  HENT:     Head: Normocephalic and atraumatic.  Neck:     Musculoskeletal: Normal range of motion and neck supple.  Cardiovascular:     Rate and Rhythm: Normal rate and regular rhythm.     Pulses: Normal pulses.     Heart sounds: Normal heart sounds.  Pulmonary:     Effort: Pulmonary effort is normal.     Breath sounds: Normal breath sounds.  Chest:     Chest wall: Tenderness present.  Neurological:     Mental Status: She is alert and oriented to person, place, and time.      Assessment & Plan:    Outpatient Encounter Medications as of 03/25/2019  Medication Sig  . amLODipine (NORVASC) 10 MG tablet Take 1 tablet (10 mg total) by mouth daily.  Marland Kitchen aspirin EC 81 MG tablet Take 81 mg by mouth daily.  Marland Kitchen atorvastatin (LIPITOR) 10 MG tablet TAKE 1 TABLET(10 MG) BY MOUTH DAILY (Patient taking differently: Take 10 mg by mouth daily. )  . baclofen (LIORESAL) 10 MG tablet TAKE 1/2 TABLET(5 MG) BY MOUTH EVERY 8 HOURS AS NEEDED FOR MUSCLE SPASMS  . Cholecalciferol 2000 units TABS Take 1 tablet (2,000 Units total) by mouth daily.  . cyclobenzaprine (FEXMID) 7.5 MG tablet Take 1 tablet (7.5 mg total) by mouth 3 (three) times daily as needed for muscle spasms.  Marland Kitchen dexamethasone (DECADRON) 4 MG tablet   . dronabinol (MARINOL) 2.5 MG capsule Take 1 capsule (2.5 mg total) by mouth 2 (two) times daily before a meal.  . insulin degludec (TRESIBA FLEXTOUCH) 100 UNIT/ML SOPN FlexTouch Pen Inject 0.24 mLs (24 Units total) into the skin daily.  . Lancets (ONETOUCH DELICA PLUS [id number]) MISC   . lidocaine (XYLOCAINE) 2 % solution   . loperamide (IMODIUM A-D) 2 MG tablet Take 2 tablets (4 mg total) by mouth 4 (four) times daily as needed. Take 2 at diarrhea onset , then 1 every 2hr until 12hrs with no BM. May take 2 every 4hrs at night. If diarrhea recurs repeat.  . magic mouthwash SOLN Swish and swallow equal parts Benadryl, Lidocaine and Nystatin.  5- 10 ml.'s four times a day as needed.  . meclizine (ANTIVERT) 12.5 MG tablet Take 1 tablet (12.5 mg total) by mouth 3 (three) times daily as needed for dizziness.  . metoprolol tartrate (LOPRESSOR) 25 MG tablet Take 1 tablet (25 mg total) by mouth 2 (two) times daily.  . sitaGLIPtin (JANUVIA) 50 MG tablet Take 1 tablet (50 mg total) by mouth daily. (Patient not taking: Reported on 03/12/2019)  . temazepam (RESTORIL) 7.5 MG capsule Take 1 capsule (7.5 mg total) by mouth at bedtime as needed for sleep.  . traMADol (ULTRAM) 50 MG tablet Take  1 tablet (50 mg total) by mouth every 6 (six) hours as needed.  . [DISCONTINUED] metoprolol tartrate (LOPRESSOR) 25  MG tablet Take 1 tablet (25 mg total) by mouth 2 (two) times daily.  . [DISCONTINUED] olmesartan-hydrochlorothiazide (BENICAR HCT) 20-12.5 MG tablet TK 1 T PO D  . [DISCONTINUED] prochlorperazine (COMPAZINE) 10 MG tablet Take 1 tablet (10 mg total) by mouth every 6 (six) hours as needed (Nausea or vomiting).   No facility-administered encounter medications on file as of 03/25/2019.   1. Essential hypertension  - metoprolol tartrate (LOPRESSOR) 25 MG tablet; Take 1 tablet (25 mg total) by mouth 2 (two) times daily.  Dispense: 60 tablet; Refill: 1  2. Frequent PVCs  - metoprolol tartrate (LOPRESSOR) 25 MG tablet; Take 1 tablet (25 mg total) by mouth 2 (two) times daily.  Dispense: 60 tablet; Refill: 1  3. Hyperlipidemia with target LDL less than 100 Stable-atorvastatin-  4. Right lumbar radiculitis  Follow-up: 1 month -recheck bp off HCTZ  Zenab Gronewold Hannah Beat, MD

## 2019-03-25 NOTE — Telephone Encounter (Signed)
Sorry for you - re-routed

## 2019-03-25 NOTE — Telephone Encounter (Signed)
Requested medication (s) are due for refill today: yes  Requested medication (s) are on the active medication list: yes  Last refill:  03/25/2019  Future visit scheduled: yes  Notes to clinic:  Requesting a 90 day supply   Requested Prescriptions  Pending Prescriptions Disp Refills   olmesartan (BENICAR) 20 MG tablet [Pharmacy Med Name: OLMESARTAN MEDOXOMIL 20MG  TABLETS] 90 tablet     Sig: TAKE 1 TABLET(20 MG) BY MOUTH DAILY     There is no refill protocol information for this order

## 2019-03-26 ENCOUNTER — Other Ambulatory Visit: Payer: Self-pay | Admitting: Family Medicine

## 2019-03-26 NOTE — Telephone Encounter (Signed)
Please advise on refills.  

## 2019-03-27 ENCOUNTER — Other Ambulatory Visit: Payer: Self-pay | Admitting: Family

## 2019-03-31 ENCOUNTER — Other Ambulatory Visit: Payer: Medicare HMO

## 2019-03-31 ENCOUNTER — Ambulatory Visit: Payer: Medicare HMO

## 2019-03-31 ENCOUNTER — Other Ambulatory Visit (HOSPITAL_BASED_OUTPATIENT_CLINIC_OR_DEPARTMENT_OTHER): Payer: Medicare HMO

## 2019-03-31 ENCOUNTER — Ambulatory Visit: Payer: Medicare HMO | Admitting: Hematology & Oncology

## 2019-04-01 ENCOUNTER — Other Ambulatory Visit (HOSPITAL_BASED_OUTPATIENT_CLINIC_OR_DEPARTMENT_OTHER): Payer: Medicare HMO

## 2019-04-03 ENCOUNTER — Other Ambulatory Visit: Payer: Self-pay | Admitting: Family

## 2019-04-03 DIAGNOSIS — R42 Dizziness and giddiness: Secondary | ICD-10-CM

## 2019-04-03 MED ORDER — TRAMADOL HCL 50 MG PO TABS: 50.0000 mg | ORAL_TABLET | Freq: Four times a day (QID) | ORAL | 0 refills | Status: DC | PRN

## 2019-04-04 ENCOUNTER — Other Ambulatory Visit: Payer: Self-pay | Admitting: *Deleted

## 2019-04-04 NOTE — Telephone Encounter (Signed)
Opened in error

## 2019-04-07 ENCOUNTER — Other Ambulatory Visit: Payer: Medicare HMO

## 2019-04-07 ENCOUNTER — Telehealth: Payer: Self-pay | Admitting: Family Medicine

## 2019-04-07 ENCOUNTER — Ambulatory Visit: Payer: Medicare HMO | Admitting: Family

## 2019-04-07 ENCOUNTER — Ambulatory Visit: Payer: Medicare HMO

## 2019-04-07 ENCOUNTER — Telehealth: Payer: Self-pay

## 2019-04-07 DIAGNOSIS — I10 Essential (primary) hypertension: Secondary | ICD-10-CM

## 2019-04-07 DIAGNOSIS — I493 Ventricular premature depolarization: Secondary | ICD-10-CM

## 2019-04-07 MED ORDER — METOPROLOL TARTRATE 25 MG PO TABS: 25.0000 mg | ORAL_TABLET | Freq: Two times a day (BID) | ORAL | 1 refills | Status: DC

## 2019-04-07 NOTE — Telephone Encounter (Signed)
done

## 2019-04-07 NOTE — Telephone Encounter (Signed)
Patient daughter [name] is calling and states the medication  metoprolol tartrate (LOPRESSOR) 25 MG tablet  Was not called in on 03/25/19 like mychart is showing her. She states her mother needs this medications as soon as possible. She would like a call back once this has been done at [place], [place]. Ruthe Mannan Phone:  [phone]  Fax:  [phone]

## 2019-04-07 NOTE — Telephone Encounter (Signed)
Tonya [name], CMA  

## 2019-04-08 ENCOUNTER — Telehealth: Payer: Self-pay

## 2019-04-08 ENCOUNTER — Other Ambulatory Visit (HOSPITAL_BASED_OUTPATIENT_CLINIC_OR_DEPARTMENT_OTHER): Payer: Medicare HMO

## 2019-04-08 DIAGNOSIS — I493 Ventricular premature depolarization: Secondary | ICD-10-CM

## 2019-04-08 DIAGNOSIS — I10 Essential (primary) hypertension: Secondary | ICD-10-CM

## 2019-04-08 MED ORDER — METOPROLOL TARTRATE 25 MG PO TABS: 25.0000 mg | ORAL_TABLET | Freq: Two times a day (BID) | ORAL | 1 refills | Status: DC

## 2019-04-08 NOTE — Telephone Encounter (Signed)
Tonya [name], CMA  

## 2019-04-08 NOTE — Telephone Encounter (Signed)
LeighAnn Arasely Akkerman, CMA  

## 2019-04-09 ENCOUNTER — Inpatient Hospital Stay: Payer: Medicare HMO

## 2019-04-09 ENCOUNTER — Ambulatory Visit (HOSPITAL_BASED_OUTPATIENT_CLINIC_OR_DEPARTMENT_OTHER): Admission: RE | Admit: 2019-04-09 | Payer: Medicare HMO | Source: Ambulatory Visit

## 2019-04-09 ENCOUNTER — Telehealth: Payer: Self-pay | Admitting: Hematology & Oncology

## 2019-04-09 ENCOUNTER — Inpatient Hospital Stay: Payer: Medicare HMO | Admitting: Hematology & Oncology

## 2019-04-09 ENCOUNTER — Ambulatory Visit (HOSPITAL_BASED_OUTPATIENT_CLINIC_OR_DEPARTMENT_OTHER): Payer: Medicare HMO

## 2019-04-09 NOTE — Telephone Encounter (Signed)
Patients daughter called to reschedule her appointments from 12/16.  She also was requesting to reschedule PET scan that was scheduled for 12/22.  I called central scheduling and they were unable to move date or time.  Daughter was notified that we were unable to move appt for her.  She was ok with new date time for week after Christmas.

## 2019-04-10 ENCOUNTER — Encounter (HOSPITAL_COMMUNITY): Payer: Medicare HMO

## 2019-04-10 ENCOUNTER — Telehealth: Payer: Self-pay | Admitting: Endocrinology

## 2019-04-10 ENCOUNTER — Other Ambulatory Visit: Payer: Self-pay | Admitting: Family Medicine

## 2019-04-10 NOTE — Telephone Encounter (Signed)
Please advise if virtual visit is suitable or if an office visit required

## 2019-04-10 NOTE — Telephone Encounter (Signed)
Requested medication (s) are due for refill today: yes  Requested medication (s) are on the active medication list: yes  Last refill:  05/30/2018  Future visit scheduled: no  Notes to clinic:  review for refill   Requested Prescriptions  Pending Prescriptions Disp Refills   hydrochlorothiazide (HYDRODIURIL) 25 MG tablet [Pharmacy Med Name: HYDROCHLOROT TAB 25MG ] 90 tablet 0    Sig: TAKE 1 TABLET DAILY      Cardiovascular: Diuretics - Thiazide Failed - 04/10/2019  8:09 AM      Failed - Last BP in normal range    BP Readings from Last 1 Encounters:  03/25/19 (!) 152/88          Passed - Ca in normal range and within 360 days    Calcium  Date Value Ref Range Status  03/17/2019 9.9 8.9 - 10.3 mg/dL Final          Passed - Cr in normal range and within 360 days    Creatinine  Date Value Ref Range Status  03/17/2019 0.89 0.44 - 1.00 mg/dL Final   Creatinine,U  Date Value Ref Range Status  10/10/2017 96.2 mg/dL Final          Passed - K in normal range and within 360 days    Potassium  Date Value Ref Range Status  03/17/2019 3.8 3.5 - 5.1 mmol/L Final          Passed - Na in normal range and within 360 days    Sodium  Date Value Ref Range Status  03/17/2019 143 135 - 145 mmol/L Final  09/30/2018 139 134 - 144 mmol/L Final          Passed - Valid encounter within last 6 months    Recent Outpatient Visits           5 months ago Dizziness and giddiness   Primary Care at [facility], Zoe A, MD   6 months ago Type 2 diabetes mellitus with complication, without long-term current use of insulin ([place])   Primary Care at [facility], Arlie Solomons, MD   7 months ago Essential hypertension   Primary Care at [facility], [place] A, MD   8 months ago Type 2 diabetes mellitus with complication, without long-term current use of insulin ([place])   Primary Care at [facility], Zoe A, MD   9 months ago Type 2 diabetes mellitus with complication, without long-term  current use of insulin [facility])   Primary Care at [facility], Arlie Solomons, MD       Future Appointments             In 1 week [place], Rex Kras, MD [place]   In 6 months Adrian Prows, MD [facility]

## 2019-04-10 NOTE — Telephone Encounter (Signed)
Patient's daughter called to advised that the patient has developed a dark, black spot on one of her toes.  She would like to know if this is something that Dr Loanne Drilling could looked at for her.  I did advise that she should reach out to PCP, but that I would send a message back and let Dr Loanne Drilling know and evaluate.  Call back number if necessary os [phone]

## 2019-04-10 NOTE — Telephone Encounter (Signed)
Please refer to primary care provider 

## 2019-04-10 NOTE — Telephone Encounter (Signed)
Called pt and informed of Dr. Ellison's advice. Verbalized acceptance and understanding. 

## 2019-04-11 ENCOUNTER — Inpatient Hospital Stay: Payer: Medicare HMO

## 2019-04-14 ENCOUNTER — Telehealth: Payer: Self-pay | Admitting: Hematology & Oncology

## 2019-04-14 NOTE — Telephone Encounter (Signed)
Called and advised patient's daughter of instructions again for PET scan that has been scheduled for 12/22.  I also gave her the number for Central Scheduling in case she had any further questions

## 2019-04-15 ENCOUNTER — Telehealth: Payer: Self-pay | Admitting: *Deleted

## 2019-04-15 ENCOUNTER — Ambulatory Visit (HOSPITAL_COMMUNITY)
Admission: RE | Admit: 2019-04-15 | Discharge: 2019-04-15 | Disposition: A | Discharge status: Home / Self Care | Payer: Medicare HMO | Source: Ambulatory Visit | Attending: Family | Admitting: Family

## 2019-04-15 ENCOUNTER — Other Ambulatory Visit: Payer: Self-pay

## 2019-04-15 DIAGNOSIS — C187 Malignant neoplasm of sigmoid colon: Secondary | ICD-10-CM | POA: Diagnosis not present

## 2019-04-15 DIAGNOSIS — C189 Malignant neoplasm of colon, unspecified: Secondary | ICD-10-CM | POA: Diagnosis not present

## 2019-04-15 DIAGNOSIS — C787 Secondary malignant neoplasm of liver and intrahepatic bile duct: Secondary | ICD-10-CM | POA: Diagnosis present

## 2019-04-15 DIAGNOSIS — R590 Localized enlarged lymph nodes: Secondary | ICD-10-CM | POA: Diagnosis not present

## 2019-04-15 LAB — GLUCOSE, CAPILLARY: Glucose-Capillary: 125 mg/dL — ABNORMAL HIGH (ref 70–99)

## 2019-04-15 MED ORDER — FLUDEOXYGLUCOSE F - 18 (FDG) INJECTION
8.9700 | Freq: Once | INTRAVENOUS | Status: AC
  Administered 2019-04-15: 8.97 via INTRAVENOUS

## 2019-04-15 NOTE — Telephone Encounter (Signed)
-----   Message from [name], MD sent at 04/15/2019  4:18 PM EST ----- Call - the cancer is shrinking!!!  GOD is GREAT!!!!  Merry Christmas!!  Laurey Arrow

## 2019-04-15 NOTE — Telephone Encounter (Signed)
As noted below by Dr. Marin Olp, I informed the daughter that the cancer is shrinking. She verbalized understanding.

## 2019-04-21 ENCOUNTER — Inpatient Hospital Stay: Payer: Medicare HMO

## 2019-04-21 ENCOUNTER — Inpatient Hospital Stay: Payer: Medicare HMO | Attending: Hematology & Oncology

## 2019-04-21 ENCOUNTER — Encounter: Payer: Self-pay | Admitting: Hematology & Oncology

## 2019-04-21 ENCOUNTER — Other Ambulatory Visit: Payer: Self-pay

## 2019-04-21 ENCOUNTER — Inpatient Hospital Stay (HOSPITAL_BASED_OUTPATIENT_CLINIC_OR_DEPARTMENT_OTHER): Payer: Medicare HMO | Admitting: Hematology & Oncology

## 2019-04-21 VITALS — BP 162/72 | HR 62 | Temp 97.1°F | Resp 18 | Wt 180.0 lb

## 2019-04-21 DIAGNOSIS — Z7982 Long term (current) use of aspirin: Secondary | ICD-10-CM | POA: Diagnosis not present

## 2019-04-21 DIAGNOSIS — C189 Malignant neoplasm of colon, unspecified: Secondary | ICD-10-CM

## 2019-04-21 DIAGNOSIS — D500 Iron deficiency anemia secondary to blood loss (chronic): Secondary | ICD-10-CM

## 2019-04-21 DIAGNOSIS — C182 Malignant neoplasm of ascending colon: Secondary | ICD-10-CM | POA: Insufficient documentation

## 2019-04-21 DIAGNOSIS — Z79899 Other long term (current) drug therapy: Secondary | ICD-10-CM | POA: Diagnosis not present

## 2019-04-21 DIAGNOSIS — C787 Secondary malignant neoplasm of liver and intrahepatic bile duct: Secondary | ICD-10-CM | POA: Insufficient documentation

## 2019-04-21 DIAGNOSIS — Z5111 Encounter for antineoplastic chemotherapy: Secondary | ICD-10-CM | POA: Diagnosis present

## 2019-04-21 DIAGNOSIS — D509 Iron deficiency anemia, unspecified: Secondary | ICD-10-CM | POA: Insufficient documentation

## 2019-04-21 DIAGNOSIS — Z5112 Encounter for antineoplastic immunotherapy: Secondary | ICD-10-CM | POA: Insufficient documentation

## 2019-04-21 DIAGNOSIS — C187 Malignant neoplasm of sigmoid colon: Secondary | ICD-10-CM | POA: Diagnosis not present

## 2019-04-21 LAB — CBC WITH DIFFERENTIAL (CANCER CENTER ONLY)
Abs Immature Granulocytes: 0.02 10*3/uL (ref 0.00–0.07)
Basophils Absolute: 0 10*3/uL (ref 0.0–0.1)
Basophils Relative: 0 %
Eosinophils Absolute: 0 10*3/uL (ref 0.0–0.5)
Eosinophils Relative: 1 %
HCT: 37.1 % (ref 36.0–46.0)
Hemoglobin: 11.7 g/dL — ABNORMAL LOW (ref 12.0–15.0)
Immature Granulocytes: 0 %
Lymphocytes Relative: 25 %
Lymphs Abs: 1.5 10*3/uL (ref 0.7–4.0)
MCH: 28.6 pg (ref 26.0–34.0)
MCHC: 31.5 g/dL (ref 30.0–36.0)
MCV: 90.7 fL (ref 80.0–100.0)
Monocytes Absolute: 0.7 10*3/uL (ref 0.1–1.0)
Monocytes Relative: 11 %
Neutro Abs: 3.8 10*3/uL (ref 1.7–7.7)
Neutrophils Relative %: 63 %
Platelet Count: 186 10*3/uL (ref 150–400)
RBC: 4.09 MIL/uL (ref 3.87–5.11)
RDW: 13.1 % (ref 11.5–15.5)
WBC Count: 6.1 10*3/uL (ref 4.0–10.5)
nRBC: 0 % (ref 0.0–0.2)

## 2019-04-21 LAB — CMP (CANCER CENTER ONLY)
ALT: 20 U/L (ref 0–44)
AST: 23 U/L (ref 15–41)
Albumin: 3.8 g/dL (ref 3.5–5.0)
Alkaline Phosphatase: 69 U/L (ref 38–126)
Anion gap: 5 (ref 5–15)
BUN: 18 mg/dL (ref 8–23)
CO2: 29 mmol/L (ref 22–32)
Calcium: 9.7 mg/dL (ref 8.9–10.3)
Chloride: 108 mmol/L (ref 98–111)
Creatinine: 0.68 mg/dL (ref 0.44–1.00)
GFR, Est AFR Am: >60 mL/min (ref >60)
GFR, Estimated: >60 mL/min (ref >60)
Glucose, Bld: 161 mg/dL — ABNORMAL HIGH (ref 70–99)
Potassium: 4.1 mmol/L (ref 3.5–5.1)
Sodium: 142 mmol/L (ref 135–145)
Total Bilirubin: 0.6 mg/dL (ref 0.3–1.2)
Total Protein: 5.9 g/dL — ABNORMAL LOW (ref 6.5–8.1)

## 2019-04-21 LAB — TOTAL PROTEIN, URINE DIPSTICK: Protein, ur: NEGATIVE mg/dL

## 2019-04-21 LAB — IRON AND TIBC
Iron: 66 ug/dL (ref 41–142)
Saturation Ratios: 24 % (ref 21–57)
TIBC: 269 ug/dL (ref 236–444)
UIBC: 203 ug/dL (ref 120–384)

## 2019-04-21 LAB — FERRITIN: Ferritin: 303 ng/mL (ref 11–307)

## 2019-04-21 LAB — CEA (IN HOUSE-CHCC): CEA (CHCC-In House): 2.11 ng/mL (ref 0.00–5.00)

## 2019-04-21 MED ORDER — PALONOSETRON HCL INJECTION 0.25 MG/5ML
INTRAVENOUS | Status: AC
  Filled 2019-04-21: qty 5

## 2019-04-21 MED ORDER — PALONOSETRON HCL INJECTION 0.25 MG/5ML
0.2500 mg | Freq: Once | INTRAVENOUS | Status: AC
  Administered 2019-04-21: 0.25 mg via INTRAVENOUS

## 2019-04-21 MED ORDER — DEXAMETHASONE SODIUM PHOSPHATE 10 MG/ML IJ SOLN
INTRAMUSCULAR | Status: AC
  Filled 2019-04-21: qty 1

## 2019-04-21 MED ORDER — HEPARIN SOD (PORK) LOCK FLUSH 100 UNIT/ML IV SOLN
250.0000 [IU] | Freq: Once | INTRAVENOUS | Status: DC | PRN
  Filled 2019-04-21: qty 5

## 2019-04-21 MED ORDER — HEPARIN SOD (PORK) LOCK FLUSH 100 UNIT/ML IV SOLN
500.0000 [IU] | Freq: Once | INTRAVENOUS | Status: DC | PRN
  Filled 2019-04-21: qty 5

## 2019-04-21 MED ORDER — SODIUM CHLORIDE 0.9% FLUSH
10.0000 mL | INTRAVENOUS | Status: DC | PRN
  Administered 2019-04-21: 10 mL
  Filled 2019-04-21: qty 10

## 2019-04-21 MED ORDER — DEXAMETHASONE SODIUM PHOSPHATE 10 MG/ML IJ SOLN
10.0000 mg | Freq: Once | INTRAMUSCULAR | Status: AC
  Administered 2019-04-21: 10 mg via INTRAVENOUS

## 2019-04-21 MED ORDER — SODIUM CHLORIDE 0.9 % IV SOLN
Freq: Once | INTRAVENOUS | Status: DC
  Filled 2019-04-21: qty 250

## 2019-04-21 MED ORDER — SODIUM CHLORIDE 0.9 % IV SOLN
Freq: Once | INTRAVENOUS | Status: AC
  Filled 2019-04-21: qty 250

## 2019-04-21 MED ORDER — SODIUM CHLORIDE 0.9 % IV SOLN
1920.0000 mg/m2 | INTRAVENOUS | Status: DC
  Administered 2019-04-21: 3750 mg via INTRAVENOUS
  Filled 2019-04-21: qty 75

## 2019-04-21 MED ORDER — FLUOROURACIL CHEMO INJECTION 2.5 GM/50ML
320.0000 mg/m2 | Freq: Once | INTRAVENOUS | Status: AC
  Administered 2019-04-21: 650 mg via INTRAVENOUS
  Filled 2019-04-21: qty 13

## 2019-04-21 MED ORDER — SODIUM CHLORIDE 0.9% FLUSH
3.0000 mL | INTRAVENOUS | Status: DC | PRN
  Filled 2019-04-21: qty 10

## 2019-04-21 MED ORDER — SODIUM CHLORIDE 0.9 % IV SOLN
5.0000 mg/kg | Freq: Once | INTRAVENOUS | Status: AC
  Administered 2019-04-21: 400 mg via INTRAVENOUS
  Filled 2019-04-21: qty 16

## 2019-04-21 MED ORDER — SODIUM CHLORIDE 0.9 % IV SOLN
200.0000 mg | Freq: Once | INTRAVENOUS | Status: DC
  Administered 2019-04-21: 11:00:00 200 mg via INTRAVENOUS
  Filled 2019-04-21: qty 10

## 2019-04-21 MED ORDER — SODIUM CHLORIDE 0.9 % IV SOLN
400.0000 mg/m2 | Freq: Once | INTRAVENOUS | Status: AC
  Administered 2019-04-21: 12:00:00 784 mg via INTRAVENOUS
  Filled 2019-04-21: qty 39.2

## 2019-04-21 MED ORDER — SODIUM CHLORIDE 0.9 % IV SOLN
135.0000 mg/m2 | Freq: Once | INTRAVENOUS | Status: AC
  Administered 2019-04-21: 260 mg via INTRAVENOUS
  Filled 2019-04-21: qty 3

## 2019-04-21 MED ORDER — ALTEPLASE 2 MG IJ SOLR
2.0000 mg | Freq: Once | INTRAMUSCULAR | Status: DC | PRN
  Filled 2019-04-21: qty 2

## 2019-04-21 MED ORDER — SODIUM CHLORIDE 0.9 % IV SOLN
200.0000 mg | Freq: Once | INTRAVENOUS | Status: DC
  Filled 2019-04-21: qty 10

## 2019-04-21 MED ORDER — ATROPINE SULFATE 1 MG/ML IJ SOLN: 0.5000 mg | Freq: Once | INTRAMUSCULAR | Status: DC | PRN

## 2019-04-21 NOTE — Progress Notes (Signed)
Hematology and Oncology Follow Up Visit  ORLENE SALMONS [id number] 1941/01/14 78 y.o. 04/21/2019   Principle Diagnosis:  Stage IV adenocarcinoma of the ascending colon -- NRAS+, BRAF-, HER2-, MSI/MMR -,  Iron deficiency anemia  Past Therapy: FOLFOX - s/p cycle 4  Current Therapy:   FOLFIRI/Bevacizumab -started 01/20/2019, s/p cycle #5 IV Iron as indicated   Interim History:  Ms. Delconte is here today with her daughter for follow-up.   She actually is doing pretty well.  She had a wonderful Christmas.  Her daughters came over to be with her.  Even better is the fact that her last PET scan showed that she is responding.  The PET scan was done on 04/15/2019.  Thankfully, the liver metastasis had improved.  The lymph nodes in the mesentery also improved with no metabolism.  There is still the lesion in the liver that had an SUV of 39.  This is the primary.  Unfortunately she is not a candidate for any resection.  Of note, the liver lesion now measures 5.7 x 4.7 cm.  Her last CEA level was down to 2.43.  She really has had no issues with chemotherapy.  She has had no pain with mouth sores.  Her blood pressure is doing pretty well.  She has had no bleeding.  There is been no issues with nausea or vomiting.  She has had no problems with cough.  She has had no leg swelling.  Overall, I would say her performance status is ECOG 1.     Medications:  Allergies as of 04/21/2019      Reactions   Hydrocodone Swelling, Other (See Comments)   "I started swelling, became red, and passed out"   Iohexol Hives, Shortness Of Breath, Other (See Comments)   Patient developed hives and fullness in throat post injection of 125cc's Omni 300, Onset Date: 11/15/2006   Naproxen Other (See Comments)   "It made me feel out of my head"   Ibuprofen Nausea Only   Propoxyphene N-acetaminophen Other (See Comments)   "I couldn't find the door to make my way out of the room- I was in misery"        Medication List       Accurate as of April 21, 2019  9:49 AM. If you have any questions, ask your nurse or doctor.        amLODipine 10 MG tablet Commonly known as: NORVASC Take 1 tablet (10 mg total) by mouth daily.   aspirin EC 81 MG tablet Take 81 mg by mouth daily.   atorvastatin 10 MG tablet Commonly known as: LIPITOR TAKE 1 TABLET(10 MG) BY MOUTH DAILY What changed:   how much to take  how to take this  when to take this  additional instructions   baclofen 10 MG tablet Commonly known as: LIORESAL TAKE 1/2 TABLET(5 MG) BY MOUTH EVERY 8 HOURS AS NEEDED FOR MUSCLE SPASMS   Cholecalciferol 50 MCG (2000 UT) Tabs Take 1 tablet (2,000 Units total) by mouth daily.   cyclobenzaprine 7.5 MG tablet Commonly known as: FEXMID Take 1 tablet (7.5 mg total) by mouth 3 (three) times daily as needed for muscle spasms.   dexamethasone 4 MG tablet Commonly known as: DECADRON   dronabinol 2.5 MG capsule Commonly known as: MARINOL Take 1 capsule (2.5 mg total) by mouth 2 (two) times daily before a meal.   lidocaine 2 % solution Commonly known as: XYLOCAINE   lidocaine-prilocaine cream Commonly known as: EMLA  loperamide 2 MG tablet Commonly known as: Imodium A-D Take 2 tablets (4 mg total) by mouth 4 (four) times daily as needed. Take 2 at diarrhea onset , then 1 every 2hr until 12hrs with no BM. May take 2 every 4hrs at night. If diarrhea recurs repeat.   magic mouthwash Soln Swish and swallow equal parts Benadryl, Lidocaine and Nystatin.  5- 10 ml.'s four times a day as needed.   meclizine 12.5 MG tablet Commonly known as: ANTIVERT TAKE 1 TABLET(12.5 MG) BY MOUTH THREE TIMES DAILY AS NEEDED FOR DIZZINESS   metoprolol tartrate 25 MG tablet Commonly known as: LOPRESSOR Take 1 tablet (25 mg total) by mouth 2 (two) times daily.   olmesartan 20 MG tablet Commonly known as: BENICAR TAKE 1 TABLET(20 MG) BY MOUTH DAILY   OneTouch Delica Plus [id number] Misc    sitaGLIPtin 50 MG tablet Commonly known as: JANUVIA Take 1 tablet (50 mg total) by mouth daily.   temazepam 7.5 MG capsule Commonly known as: Restoril Take 1 capsule (7.5 mg total) by mouth at bedtime as needed for sleep.   traMADol 50 MG tablet Commonly known as: ULTRAM Take 1 tablet (50 mg total) by mouth every 6 (six) hours as needed.   Tyler Aas FlexTouch 100 UNIT/ML Sopn FlexTouch Pen Generic drug: insulin degludec Inject 0.24 mLs (24 Units total) into the skin daily.       Allergies:  Allergies  Allergen Reactions  . Hydrocodone Swelling and Other (See Comments)    "I started swelling, became red, and passed out"  . Iohexol Hives, Shortness Of Breath and Other (See Comments)    Patient developed hives and fullness in throat post injection of 125cc's Omni 300, Onset Date: 11/15/2006   . Naproxen Other (See Comments)    "It made me feel out of my head"  . Ibuprofen Nausea Only  . Propoxyphene N-Acetaminophen Other (See Comments)    "I couldn't find the door to make my way out of the room- I was in misery"    Past Medical History, Surgical history, Social history, and Family History were reviewed and updated.  Review of Systems: Review of Systems  Constitutional: Negative.   HENT: Negative.   Eyes: Negative.   Respiratory: Negative.   Cardiovascular: Negative.   Gastrointestinal: Positive for abdominal pain.  Genitourinary: Negative.   Musculoskeletal: Positive for back pain.  Skin: Negative.   Neurological: Negative.   Endo/Heme/Allergies: Negative.   Psychiatric/Behavioral: The patient has insomnia.       Physical Exam:  weight is 180 lb (81.6 kg). Her temporal temperature is 97.1 F (36.2 C) (abnormal). Her blood pressure is 162/72 (abnormal) and her pulse is 62. Her respiration is 18 and oxygen saturation is 100%.   Wt Readings from Last 3 Encounters:  04/21/19 180 lb (81.6 kg)  04/21/19 180 lb (81.6 kg)  03/25/19 179 lb (81.2 kg)    Physical  Exam Vitals reviewed.  HENT:     Head: Normocephalic and atraumatic.  Eyes:     Pupils: Pupils are equal, round, and reactive to light.  Cardiovascular:     Rate and Rhythm: Normal rate and regular rhythm.     Heart sounds: Normal heart sounds.  Pulmonary:     Effort: Pulmonary effort is normal.     Breath sounds: Normal breath sounds.  Abdominal:     General: Bowel sounds are normal.     Palpations: Abdomen is soft.     Comments: Abdominal exam is soft.  She has  good bowel sounds.  There is no fluid wave.  There is no palpable liver or spleen tip.  Musculoskeletal:        General: No tenderness or deformity. Normal range of motion.     Cervical back: Normal range of motion.  Lymphadenopathy:     Cervical: No cervical adenopathy.  Skin:    General: Skin is warm and dry.     Findings: No erythema or rash.  Neurological:     Mental Status: She is alert and oriented to person, place, and time.     Comments: She has chronic weakness on her left side.  This is from a past CVA.  Psychiatric:        Behavior: Behavior normal.        Thought Content: Thought content normal.        Judgment: Judgment normal.      Lab Results  Component Value Date   WBC 6.1 04/21/2019   HGB 11.7 (L) 04/21/2019   HCT 37.1 04/21/2019   MCV 90.7 04/21/2019   PLT 186 04/21/2019   Lab Results  Component Value Date   FERRITIN 313 (H) 03/03/2019   IRON 60 03/03/2019   TIBC 291 03/03/2019   UIBC 231 03/03/2019   IRONPCTSAT 21 03/03/2019   Lab Results  Component Value Date   RETICCTPCT 1.5 05/17/2016   RBC 4.09 04/21/2019   RETICCTABS 62,400 05/17/2016   No results found for: KPAFRELGTCHN, LAMBDASER, KAPLAMBRATIO No results found for: IGGSERUM, IGA, IGMSERUM No results found for: Odetta Pink, SPEI   Chemistry      Component Value Date/Time   NA 143 03/17/2019 0925   NA 139 09/30/2018 1311   K 3.8 03/17/2019 0925   CL 104 03/17/2019  0925   CO2 30 03/17/2019 0925   BUN 12 03/17/2019 0925   BUN 10 09/30/2018 1311   CREATININE 0.89 03/17/2019 0925      Component Value Date/Time   CALCIUM 9.9 03/17/2019 0925   ALKPHOS 70 03/17/2019 0925   AST 21 03/17/2019 0925   ALT 17 03/17/2019 0925   BILITOT 0.8 03/17/2019 0925       Impression and Plan: Ms. Radich is a very pleasant 78 yo African American female with metastatic colon cancer.   I really think that the key to her success with this protocol is at adding the Avastin.  Her blood pressure has been on the high side.  However, this seems to be improving.  Her heart rate definitely is not fast today.    We will go ahead with her 6 cycle of treatment today.  We will plan to get her back to see [place] in another 3 weeks.  I think that the 3-week cycles really works well for her.  Her blood counts have tolerated this.  She will go ahead and get iron today.  I think that she needs iron.  [name], MD 12/28/20209:49 AM

## 2019-04-22 ENCOUNTER — Telehealth (INDEPENDENT_AMBULATORY_CARE_PROVIDER_SITE_OTHER): Payer: Medicare HMO | Admitting: Family Medicine

## 2019-04-22 ENCOUNTER — Encounter: Payer: Self-pay | Admitting: Family Medicine

## 2019-04-22 VITALS — BP 162/72 | Temp 97.1°F | Wt 180.0 lb

## 2019-04-22 DIAGNOSIS — I10 Essential (primary) hypertension: Secondary | ICD-10-CM | POA: Diagnosis not present

## 2019-04-22 DIAGNOSIS — E1169 Type 2 diabetes mellitus with other specified complication: Secondary | ICD-10-CM

## 2019-04-22 NOTE — Progress Notes (Signed)
Virtual Visit via Telephone Note  I connected with Peggye Fothergill on 04/22/19 at 12:40 by telephone and verified that I am speaking with the correct person using two identifiers.DOB/address Reached pt at  [phone]( spoke with daughter-pts mobile number requested for use today only)  Location: Patient home Provider: clinic   I discussed the limitations, risks, security and privacy concerns of performing an evaluation and management service by telephone and the availability of in person appointments. I also discussed with the patient that there may be a patient responsible charge related to this service. The patient expressed understanding and agreed to proceed.   History of Present Illness: HTN-elevated blood pressure noted at oncology-pt states she had elevated bp yesterday at the oncology office. Pt states she was out of blood pressure medication but is unclear on the medication-daughter will pick up refills today. Pt seen at oncology yesterday- bp 162/72. Pt states bp and glucose higher at oncology office  Principle Diagnosis:  Stage IV adenocarcinoma of the ascending colon -- NRAS+, BRAF-, HER2-, MSI/MMR -,  Iron deficiency anemia Past Therapy: FOLFOX - s/p cycle 4 Current Therapy:        FOLFIRI/Bevacizumab -started 01/20/2019,s/p cycle#5 IV Iron as indicated         Even better is the fact that her last PET scan showed that she is responding.  The PET scan was done on 04/15/2019.  Thankfully, the liver metastasis had improved.  The lymph nodes in the mesentery also improved with no metabolism.  There is still the lesion in the liver that had an SUV of 39.  This is the primary.  Unfortunately she is not a candidate for any resection.  Of note, the liver lesion now measures 5.7 x 4.7 cm.  CEA level was down to 2.43. She really has had no issues with chemotherapy.  She has had no pain with mouth sores.  Her blood pressure is doing pretty well.She has had no bleeding. There is  been no issues with nausea or vomiting.  She has had no problems with cough.  She has had no leg swelling. Overall, I would say her performance status is ECOG 1.   Observations/Objective: 162/72 at oncology Systolic 563-149 at home Glucose readings 100-120 fasting, 200 after chemo-sees endo Assessment and Plan: 1. Type 2 diabetes mellitus with other specified complication, unspecified whether long term insulin use ([place]) Pt requested referral to podiatry for evaluation of her feet - Ambulatory referral to Podiatry  2. Essential hypertension Systolic bp elevated-refill medication-pt to continue taking bp medications -will check bp at home and will be seen frequently in oncology for evaluation-pt will call if concerns for bp greater than 150/90 on medication  Follow Up Instructions: Podiatry referral   I discussed the assessment and treatment plan with the patient. The patient was provided an opportunity to ask questions and all were answered. The patient agreed with the plan and demonstrated an understanding of the instructions.   The patient was advised to call back or seek an in-person evaluation if continued elevated blood pressure I provided 12 minutes of non-face-to-face time during this encounter.   Dakhari Zuver Hannah Beat, MD

## 2019-04-23 ENCOUNTER — Telehealth: Payer: Self-pay | Admitting: Endocrinology

## 2019-04-23 ENCOUNTER — Other Ambulatory Visit: Payer: Self-pay

## 2019-04-23 ENCOUNTER — Inpatient Hospital Stay: Payer: Medicare HMO

## 2019-04-23 ENCOUNTER — Other Ambulatory Visit: Payer: Self-pay | Admitting: Family

## 2019-04-23 VITALS — BP 154/80 | HR 61 | Temp 97.1°F | Resp 18

## 2019-04-23 DIAGNOSIS — Z7982 Long term (current) use of aspirin: Secondary | ICD-10-CM | POA: Diagnosis not present

## 2019-04-23 DIAGNOSIS — Z5112 Encounter for antineoplastic immunotherapy: Secondary | ICD-10-CM | POA: Diagnosis not present

## 2019-04-23 DIAGNOSIS — C182 Malignant neoplasm of ascending colon: Secondary | ICD-10-CM | POA: Diagnosis not present

## 2019-04-23 DIAGNOSIS — Z5111 Encounter for antineoplastic chemotherapy: Secondary | ICD-10-CM | POA: Diagnosis not present

## 2019-04-23 DIAGNOSIS — Z79899 Other long term (current) drug therapy: Secondary | ICD-10-CM | POA: Diagnosis not present

## 2019-04-23 DIAGNOSIS — C189 Malignant neoplasm of colon, unspecified: Secondary | ICD-10-CM

## 2019-04-23 DIAGNOSIS — E1149 Type 2 diabetes mellitus with other diabetic neurological complication: Secondary | ICD-10-CM

## 2019-04-23 DIAGNOSIS — D509 Iron deficiency anemia, unspecified: Secondary | ICD-10-CM | POA: Diagnosis not present

## 2019-04-23 DIAGNOSIS — C787 Secondary malignant neoplasm of liver and intrahepatic bile duct: Secondary | ICD-10-CM | POA: Diagnosis not present

## 2019-04-23 MED ORDER — SODIUM CHLORIDE 0.9% FLUSH
10.0000 mL | INTRAVENOUS | Status: DC | PRN
  Administered 2019-04-23: 10 mL
  Filled 2019-04-23: qty 10

## 2019-04-23 MED ORDER — HEPARIN SOD (PORK) LOCK FLUSH 100 UNIT/ML IV SOLN
500.0000 [IU] | Freq: Once | INTRAVENOUS | Status: AC | PRN
  Administered 2019-04-23: 500 [IU]
  Filled 2019-04-23: qty 5

## 2019-04-23 NOTE — Telephone Encounter (Signed)
Patient's family is requesting a referral to a foot doctor for Tonya [name]

## 2019-04-23 NOTE — Telephone Encounter (Signed)
Would you like to do this or refer to PCP?

## 2019-04-23 NOTE — Telephone Encounter (Signed)
Ok, I did referral 

## 2019-05-05 ENCOUNTER — Telehealth: Payer: Self-pay

## 2019-05-05 ENCOUNTER — Other Ambulatory Visit: Payer: Self-pay | Admitting: Family Medicine

## 2019-05-05 ENCOUNTER — Telehealth: Payer: Self-pay | Admitting: Family Medicine

## 2019-05-05 DIAGNOSIS — I10 Essential (primary) hypertension: Secondary | ICD-10-CM

## 2019-05-05 DIAGNOSIS — I493 Ventricular premature depolarization: Secondary | ICD-10-CM

## 2019-05-05 MED ORDER — AMLODIPINE BESYLATE 10 MG PO TABS: 10.0000 mg | ORAL_TABLET | Freq: Every day | ORAL | 0 refills | Status: DC

## 2019-05-05 NOTE — Telephone Encounter (Signed)
Patient is requesting a refill on the following medications.  amLODipine (NORVASC) 10 MG tablet  metoprolol tartrate (LOPRESSOR) 25 MG tablet  Informed pt there should be a refill on metoprolol at the pharmacy. She states the pharmacy is telling her it is not.  [id number] - [place], [address] Phone:  [phone]  Fax:  [phone]

## 2019-05-05 NOTE — Telephone Encounter (Signed)
done

## 2019-05-05 NOTE — Telephone Encounter (Signed)
Tonya [name], CMA  

## 2019-05-12 ENCOUNTER — Other Ambulatory Visit: Payer: Self-pay | Admitting: Family

## 2019-05-12 ENCOUNTER — Inpatient Hospital Stay: Payer: Medicare HMO | Attending: Hematology & Oncology

## 2019-05-12 ENCOUNTER — Other Ambulatory Visit: Payer: Self-pay

## 2019-05-12 ENCOUNTER — Ambulatory Visit (HOSPITAL_BASED_OUTPATIENT_CLINIC_OR_DEPARTMENT_OTHER)
Admission: RE | Admit: 2019-05-12 | Discharge: 2019-05-12 | Disposition: A | Discharge status: Home / Self Care | Payer: Medicare HMO | Source: Ambulatory Visit | Attending: Family | Admitting: Family

## 2019-05-12 ENCOUNTER — Inpatient Hospital Stay: Payer: Medicare HMO

## 2019-05-12 ENCOUNTER — Telehealth: Payer: Self-pay | Admitting: Hematology & Oncology

## 2019-05-12 ENCOUNTER — Inpatient Hospital Stay (HOSPITAL_BASED_OUTPATIENT_CLINIC_OR_DEPARTMENT_OTHER): Payer: Medicare HMO | Admitting: Family

## 2019-05-12 ENCOUNTER — Encounter: Payer: Self-pay | Admitting: Family

## 2019-05-12 ENCOUNTER — Telehealth: Payer: Self-pay | Admitting: *Deleted

## 2019-05-12 VITALS — BP 150/73 | HR 75 | Temp 97.8°F | Resp 17 | Ht 71.0 in | Wt 179.0 lb

## 2019-05-12 DIAGNOSIS — C787 Secondary malignant neoplasm of liver and intrahepatic bile duct: Secondary | ICD-10-CM | POA: Diagnosis not present

## 2019-05-12 DIAGNOSIS — C189 Malignant neoplasm of colon, unspecified: Secondary | ICD-10-CM | POA: Insufficient documentation

## 2019-05-12 DIAGNOSIS — Z8673 Personal history of transient ischemic attack (TIA), and cerebral infarction without residual deficits: Secondary | ICD-10-CM | POA: Diagnosis not present

## 2019-05-12 DIAGNOSIS — Z5112 Encounter for antineoplastic immunotherapy: Secondary | ICD-10-CM | POA: Insufficient documentation

## 2019-05-12 DIAGNOSIS — Z79899 Other long term (current) drug therapy: Secondary | ICD-10-CM | POA: Diagnosis not present

## 2019-05-12 DIAGNOSIS — C182 Malignant neoplasm of ascending colon: Secondary | ICD-10-CM | POA: Diagnosis present

## 2019-05-12 DIAGNOSIS — Z5111 Encounter for antineoplastic chemotherapy: Secondary | ICD-10-CM | POA: Insufficient documentation

## 2019-05-12 DIAGNOSIS — D509 Iron deficiency anemia, unspecified: Secondary | ICD-10-CM | POA: Diagnosis not present

## 2019-05-12 DIAGNOSIS — Z7982 Long term (current) use of aspirin: Secondary | ICD-10-CM | POA: Insufficient documentation

## 2019-05-12 DIAGNOSIS — M1611 Unilateral primary osteoarthritis, right hip: Secondary | ICD-10-CM | POA: Diagnosis not present

## 2019-05-12 DIAGNOSIS — M25551 Pain in right hip: Secondary | ICD-10-CM | POA: Diagnosis not present

## 2019-05-12 LAB — CMP (CANCER CENTER ONLY)
ALT: 16 U/L (ref 0–44)
AST: 22 U/L (ref 15–41)
Albumin: 3.5 g/dL (ref 3.5–5.0)
Alkaline Phosphatase: 81 U/L (ref 38–126)
Anion gap: 6 (ref 5–15)
BUN: 11 mg/dL (ref 8–23)
CO2: 29 mmol/L (ref 22–32)
Calcium: 9.4 mg/dL (ref 8.9–10.3)
Chloride: 107 mmol/L (ref 98–111)
Creatinine: 0.68 mg/dL (ref 0.44–1.00)
GFR, Est AFR Am: >60 mL/min (ref >60)
GFR, Estimated: >60 mL/min (ref >60)
Glucose, Bld: 150 mg/dL — ABNORMAL HIGH (ref 70–99)
Potassium: 4 mmol/L (ref 3.5–5.1)
Sodium: 142 mmol/L (ref 135–145)
Total Bilirubin: 0.6 mg/dL (ref 0.3–1.2)
Total Protein: 5.5 g/dL — ABNORMAL LOW (ref 6.5–8.1)

## 2019-05-12 LAB — CBC WITH DIFFERENTIAL (CANCER CENTER ONLY)
Abs Immature Granulocytes: 0.02 10*3/uL (ref 0.00–0.07)
Basophils Absolute: 0 10*3/uL (ref 0.0–0.1)
Basophils Relative: 0 %
Eosinophils Absolute: 0 10*3/uL (ref 0.0–0.5)
Eosinophils Relative: 1 %
HCT: 35.6 % — ABNORMAL LOW (ref 36.0–46.0)
Hemoglobin: 11.1 g/dL — ABNORMAL LOW (ref 12.0–15.0)
Immature Granulocytes: 0 %
Lymphocytes Relative: 31 %
Lymphs Abs: 1.4 10*3/uL (ref 0.7–4.0)
MCH: 27.9 pg (ref 26.0–34.0)
MCHC: 31.2 g/dL (ref 30.0–36.0)
MCV: 89.4 fL (ref 80.0–100.0)
Monocytes Absolute: 0.6 10*3/uL (ref 0.1–1.0)
Monocytes Relative: 12 %
Neutro Abs: 2.5 10*3/uL (ref 1.7–7.7)
Neutrophils Relative %: 56 %
Platelet Count: 196 10*3/uL (ref 150–400)
RBC: 3.98 MIL/uL (ref 3.87–5.11)
RDW: 12.9 % (ref 11.5–15.5)
WBC Count: 4.5 10*3/uL (ref 4.0–10.5)
nRBC: 0 % (ref 0.0–0.2)

## 2019-05-12 LAB — FERRITIN: Ferritin: 477 ng/mL — ABNORMAL HIGH (ref 11–307)

## 2019-05-12 LAB — IRON AND TIBC
Iron: 46 ug/dL (ref 41–142)
Saturation Ratios: 22 % (ref 21–57)
TIBC: 213 ug/dL — ABNORMAL LOW (ref 236–444)
UIBC: 167 ug/dL (ref 120–384)

## 2019-05-12 LAB — CEA (IN HOUSE-CHCC): CEA (CHCC-In House): 2.81 ng/mL (ref 0.00–5.00)

## 2019-05-12 MED ORDER — FLUOROURACIL CHEMO INJECTION 2.5 GM/50ML
320.0000 mg/m2 | Freq: Once | INTRAVENOUS | Status: AC
  Administered 2019-05-12: 650 mg via INTRAVENOUS
  Filled 2019-05-12: qty 13

## 2019-05-12 MED ORDER — DEXAMETHASONE SODIUM PHOSPHATE 10 MG/ML IJ SOLN
10.0000 mg | Freq: Once | INTRAMUSCULAR | Status: AC
  Administered 2019-05-12: 11:00:00 10 mg via INTRAVENOUS

## 2019-05-12 MED ORDER — SODIUM CHLORIDE 0.9 % IV SOLN
5.0000 mg/kg | Freq: Once | INTRAVENOUS | Status: AC
  Administered 2019-05-12: 400 mg via INTRAVENOUS
  Filled 2019-05-12: qty 16

## 2019-05-12 MED ORDER — SODIUM CHLORIDE 0.9 % IV SOLN
1920.0000 mg/m2 | INTRAVENOUS | Status: DC
  Administered 2019-05-12: 3750 mg via INTRAVENOUS
  Filled 2019-05-12: qty 75

## 2019-05-12 MED ORDER — SODIUM CHLORIDE 0.9 % IV SOLN
400.0000 mg/m2 | Freq: Once | INTRAVENOUS | Status: AC
  Administered 2019-05-12: 12:00:00 784 mg via INTRAVENOUS
  Filled 2019-05-12: qty 39.2

## 2019-05-12 MED ORDER — SODIUM CHLORIDE 0.9 % IV SOLN: 135.0000 mg/m2 | Freq: Once | INTRAVENOUS | Status: DC

## 2019-05-12 MED ORDER — PALONOSETRON HCL INJECTION 0.25 MG/5ML
INTRAVENOUS | Status: AC
  Filled 2019-05-12: qty 5

## 2019-05-12 MED ORDER — SODIUM CHLORIDE 0.9% FLUSH
10.0000 mL | INTRAVENOUS | Status: DC | PRN
  Filled 2019-05-12: qty 10

## 2019-05-12 MED ORDER — PALONOSETRON HCL INJECTION 0.25 MG/5ML
0.2500 mg | Freq: Once | INTRAVENOUS | Status: AC
  Administered 2019-05-12: 0.25 mg via INTRAVENOUS

## 2019-05-12 MED ORDER — SODIUM CHLORIDE 0.9 % IV SOLN
Freq: Once | INTRAVENOUS | Status: AC
  Filled 2019-05-12: qty 250

## 2019-05-12 MED ORDER — SODIUM CHLORIDE 0.9 % IV SOLN
135.0000 mg/m2 | Freq: Once | INTRAVENOUS | Status: AC
  Administered 2019-05-12: 260 mg via INTRAVENOUS
  Filled 2019-05-12: qty 10

## 2019-05-12 MED ORDER — DEXAMETHASONE SODIUM PHOSPHATE 10 MG/ML IJ SOLN
INTRAMUSCULAR | Status: AC
  Filled 2019-05-12: qty 1

## 2019-05-12 MED ORDER — HEPARIN SOD (PORK) LOCK FLUSH 100 UNIT/ML IV SOLN
500.0000 [IU] | Freq: Once | INTRAVENOUS | Status: DC | PRN
  Filled 2019-05-12: qty 5

## 2019-05-12 NOTE — Progress Notes (Signed)
Patient is changing to treatment every 3 weeks. Dr. Marin Olp would like to keep the bevacizumab dose 5 mg/kg.

## 2019-05-12 NOTE — Progress Notes (Signed)
Hematology and Oncology Follow Up Visit  Tonya [name] [id number] Apr 02, 1941 79 y.o. 05/12/2019   Principle Diagnosis:  Stage IV adenocarcinoma of the ascending colon -- NRAS+, BRAF-, HER2-, MSI/MMR -,  Iron deficiency anemia  Past Therapy: FOLFOX - s/p cycle 4  Current Therapy:   FOLFIRI/Bevacizumab -started 01/20/2019,s/p cycle6 IV Iron as indicated   Interim History:  Tonya [name] is here today with her daughter for follow-up and treatment (cycle 7).  She is recently recuperated from a sinus infection and is feeling much better.  She still has discomfort off and on in the right hip and side. PET scan 3 weeks ago did not show any activity in this area other than known liver involvement.  No falls or syncopal episodes to report.  She has residual weakness and mild swelling in the left side since her stroke. No numbness or tingling in her extremities.   No fever, chills, n/v, cough, rash, dizziness, SOB, chest pain, palpitations, abdominal pain or changes in bowel or bladder habits.  No bleeding, bruising or petechiae.  She states that she has maintained a good appetite and is staying well hydrated. Her weight is stable.   ECOG Performance Status: 1 - Symptomatic but completely ambulatory  Medications:  Allergies as of 05/12/2019      Reactions   Hydrocodone Swelling, Other (See Comments)   "I started swelling, became red, and passed out"   Iohexol Hives, Shortness Of Breath, Other (See Comments)   Patient developed hives and fullness in throat post injection of 125cc's Omni 300, Onset Date: 11/15/2006   Naproxen Other (See Comments)   "It made me feel out of my head"   Ibuprofen Nausea Only   Propoxyphene N-acetaminophen Other (See Comments)   "I couldn't find the door to make my way out of the room- I was in misery"      Medication List       Accurate as of May 12, 2019 10:02 AM. If you have any questions, ask your nurse or doctor.        amLODipine 10 MG  tablet Commonly known as: NORVASC TAKE 1 TABLET(10 MG) BY MOUTH DAILY   aspirin EC 81 MG tablet Take 81 mg by mouth daily.   atorvastatin 10 MG tablet Commonly known as: LIPITOR TAKE 1 TABLET(10 MG) BY MOUTH DAILY What changed:   how much to take  how to take this  when to take this  additional instructions   baclofen 10 MG tablet Commonly known as: LIORESAL TAKE 1/2 TABLET(5 MG) BY MOUTH EVERY 8 HOURS AS NEEDED FOR MUSCLE SPASMS   Cholecalciferol 50 MCG (2000 UT) Tabs Take 1 tablet (2,000 Units total) by mouth daily.   cyclobenzaprine 7.5 MG tablet Commonly known as: FEXMID Take 1 tablet (7.5 mg total) by mouth 3 (three) times daily as needed for muscle spasms.   dexamethasone 4 MG tablet Commonly known as: DECADRON   dronabinol 2.5 MG capsule Commonly known as: MARINOL Take 1 capsule (2.5 mg total) by mouth 2 (two) times daily before a meal.   lidocaine 2 % solution Commonly known as: XYLOCAINE   lidocaine-prilocaine cream Commonly known as: EMLA   loperamide 2 MG tablet Commonly known as: Imodium A-D Take 2 tablets (4 mg total) by mouth 4 (four) times daily as needed. Take 2 at diarrhea onset , then 1 every 2hr until 12hrs with no BM. May take 2 every 4hrs at night. If diarrhea recurs repeat.   magic mouthwash Soln Swish and  swallow equal parts Benadryl, Lidocaine and Nystatin.  5- 10 ml.'s four times a day as needed.   meclizine 12.5 MG tablet Commonly known as: ANTIVERT TAKE 1 TABLET(12.5 MG) BY MOUTH THREE TIMES DAILY AS NEEDED FOR DIZZINESS   metoprolol tartrate 25 MG tablet Commonly known as: LOPRESSOR TAKE 1 TABLET(25 MG) BY MOUTH TWICE DAILY   olmesartan 20 MG tablet Commonly known as: BENICAR TAKE 1 TABLET(20 MG) BY MOUTH DAILY   OneTouch Delica Plus [id number] Misc   sitaGLIPtin 50 MG tablet Commonly known as: JANUVIA Take 1 tablet (50 mg total) by mouth daily.   temazepam 7.5 MG capsule Commonly known as: Restoril Take 1 capsule (7.5  mg total) by mouth at bedtime as needed for sleep.   traMADol 50 MG tablet Commonly known as: ULTRAM Take 1 tablet (50 mg total) by mouth every 6 (six) hours as needed.   Tyler Aas FlexTouch 100 UNIT/ML Sopn FlexTouch Pen Generic drug: insulin degludec Inject 0.24 mLs (24 Units total) into the skin daily.       Allergies:  Allergies  Allergen Reactions  . Hydrocodone Swelling and Other (See Comments)    "I started swelling, became red, and passed out"  . Iohexol Hives, Shortness Of Breath and Other (See Comments)    Patient developed hives and fullness in throat post injection of 125cc's Omni 300, Onset Date: 11/15/2006   . Naproxen Other (See Comments)    "It made me feel out of my head"  . Ibuprofen Nausea Only  . Propoxyphene N-Acetaminophen Other (See Comments)    "I couldn't find the door to make my way out of the room- I was in misery"    Past Medical History, Surgical history, Social history, and Family History were reviewed and updated.  Review of Systems: All other 10 point review of systems is negative.   Physical Exam:  height is '5\' 11"'  (1.803 m) and weight is 179 lb (81.2 kg). Her temporal temperature is 97.8 F (36.6 C). Her blood pressure is 150/73 (abnormal) and her pulse is 75. Her respiration is 17 and oxygen saturation is 100%.   Wt Readings from Last 3 Encounters:  05/12/19 179 lb (81.2 kg)  04/22/19 180 lb (81.6 kg)  04/21/19 180 lb (81.6 kg)    Ocular: Sclerae unicteric, pupils equal, round and reactive to light Ear-nose-throat: Oropharynx clear, dentition fair Lymphatic: No cervical or supraclavicular adenopathy Lungs no rales or rhonchi, good excursion bilaterally Heart regular rate and rhythm, no murmur appreciated Abd soft, nontender, positive bowel sounds, no liver or spleen tip palpated on exam, no fluid wave  MSK no focal spinal tenderness, no joint edema Neuro: non-focal, well-oriented, appropriate affect Breasts: Deferred   Lab Results    Component Value Date   WBC 4.5 05/12/2019   HGB 11.1 (L) 05/12/2019   HCT 35.6 (L) 05/12/2019   MCV 89.4 05/12/2019   PLT 196 05/12/2019   Lab Results  Component Value Date   FERRITIN 303 04/21/2019   IRON 66 04/21/2019   TIBC 269 04/21/2019   UIBC 203 04/21/2019   IRONPCTSAT 24 04/21/2019   Lab Results  Component Value Date   RETICCTPCT 1.5 05/17/2016   RBC 3.98 05/12/2019   RETICCTABS 62,400 05/17/2016   No results found for: KPAFRELGTCHN, LAMBDASER, KAPLAMBRATIO No results found for: IGGSERUM, IGA, IGMSERUM No results found for: TOTALPROTELP, ALBUMINELP, A1GS, A2GS, BETS, BETA2SER, GAMS, MSPIKE, SPEI   Chemistry      Component Value Date/Time   NA 142 04/21/2019 0920  NA 139 09/30/2018 1311   K 4.1 04/21/2019 0920   CL 108 04/21/2019 0920   CO2 29 04/21/2019 0920   BUN 18 04/21/2019 0920   BUN 10 09/30/2018 1311   CREATININE 0.68 04/21/2019 0920      Component Value Date/Time   CALCIUM 9.7 04/21/2019 0920   ALKPHOS 69 04/21/2019 0920   AST 23 04/21/2019 0920   ALT 20 04/21/2019 0920   BILITOT 0.6 04/21/2019 0920       Impression and Plan: Tonya [name] is a very pleasant 79 yo African American female with metastatic colon cancer.CEA in December was down to 2.11!  We will get an xray of the left hip to better assess for cause of intermittent discomfort.  We will proceed with treatment today as planned.  We will see her back in another 3 weeks.  They will contact our office with any questions or concerns. We can certainly see her sooner if needed.   Laverna Peace, NP 1/18/202110:02 AM

## 2019-05-12 NOTE — Telephone Encounter (Signed)
-----   Message from Eliezer Bottom, NP sent at 05/12/2019  3:48 PM EST ----- Mild degenerative changes, no evidence of malignancy.   Tonya [name] ----- Message ----- From: Buel Ream, Rad Results In Sent: 05/12/2019   3:47 PM EST To: Eliezer Bottom, NP

## 2019-05-12 NOTE — Patient Instructions (Signed)

## 2019-05-12 NOTE — Telephone Encounter (Signed)
Appointments previously scheduled as requested per 1/18 los

## 2019-05-12 NOTE — Patient Instructions (Signed)
[facility] Discharge Instructions for Patients Receiving Chemotherapy  Today you received the following chemotherapy agents Irinotecan, Avastin  To help prevent nausea and vomiting after your treatment, we encourage you to take your nausea medication    If you develop nausea and vomiting that is not controlled by your nausea medication, call the clinic.   BELOW ARE SYMPTOMS THAT SHOULD BE REPORTED IMMEDIATELY:  *FEVER GREATER THAN 100.5 F  *CHILLS WITH OR WITHOUT FEVER  NAUSEA AND VOMITING THAT IS NOT CONTROLLED WITH YOUR NAUSEA MEDICATION  *UNUSUAL SHORTNESS OF BREATH  *UNUSUAL BRUISING OR BLEEDING  TENDERNESS IN MOUTH AND THROAT WITH OR WITHOUT PRESENCE OF ULCERS  *URINARY PROBLEMS  *BOWEL PROBLEMS  UNUSUAL RASH Items with * indicate a potential emergency and should be followed up as soon as possible.  Feel free to call the clinic should you have any questions or concerns. The clinic phone number is [phone].  Please show the [facility] at check-in to the Emergency Department and triage nurse.

## 2019-05-12 NOTE — Telephone Encounter (Signed)
Patient's daughter [name] notified per order of [place]. [place] NP that there are "no degenerative changes and no evidence of malignancy" from hip xray today. [name] appreciative of call and states that she will continue to have pt take Aleve for hip discomfort.

## 2019-05-12 NOTE — Addendum Note (Signed)
Addended by: Burney Gauze R on: 05/12/2019 10:52 AM   Modules accepted: Orders

## 2019-05-13 ENCOUNTER — Other Ambulatory Visit: Payer: Self-pay

## 2019-05-14 ENCOUNTER — Inpatient Hospital Stay: Payer: Medicare HMO

## 2019-05-14 ENCOUNTER — Other Ambulatory Visit: Payer: Self-pay

## 2019-05-14 DIAGNOSIS — C182 Malignant neoplasm of ascending colon: Secondary | ICD-10-CM | POA: Diagnosis not present

## 2019-05-14 DIAGNOSIS — D509 Iron deficiency anemia, unspecified: Secondary | ICD-10-CM | POA: Diagnosis not present

## 2019-05-14 DIAGNOSIS — Z79899 Other long term (current) drug therapy: Secondary | ICD-10-CM | POA: Diagnosis not present

## 2019-05-14 DIAGNOSIS — C189 Malignant neoplasm of colon, unspecified: Secondary | ICD-10-CM

## 2019-05-14 DIAGNOSIS — Z5112 Encounter for antineoplastic immunotherapy: Secondary | ICD-10-CM | POA: Diagnosis not present

## 2019-05-14 DIAGNOSIS — Z5111 Encounter for antineoplastic chemotherapy: Secondary | ICD-10-CM | POA: Diagnosis not present

## 2019-05-14 DIAGNOSIS — Z7982 Long term (current) use of aspirin: Secondary | ICD-10-CM | POA: Diagnosis not present

## 2019-05-14 DIAGNOSIS — Z8673 Personal history of transient ischemic attack (TIA), and cerebral infarction without residual deficits: Secondary | ICD-10-CM | POA: Diagnosis not present

## 2019-05-14 MED ORDER — SODIUM CHLORIDE 0.9% FLUSH
10.0000 mL | INTRAVENOUS | Status: DC | PRN
  Administered 2019-05-14: 10 mL
  Filled 2019-05-14: qty 10

## 2019-05-14 MED ORDER — HEPARIN SOD (PORK) LOCK FLUSH 100 UNIT/ML IV SOLN
500.0000 [IU] | Freq: Once | INTRAVENOUS | Status: AC | PRN
  Administered 2019-05-14: 14:00:00 500 [IU]
  Filled 2019-05-14: qty 5

## 2019-05-14 NOTE — Patient Instructions (Signed)
Fluorouracil, 5-FU injection What is this medicine? FLUOROURACIL, 5-FU (flure oh YOOR a sil) is a chemotherapy drug. It slows the growth of cancer cells. This medicine is used to treat many types of cancer like breast cancer, colon or rectal cancer, pancreatic cancer, and stomach cancer. This medicine may be used for other purposes; ask your health care provider or pharmacist if you have questions. COMMON BRAND NAME(S): Adrucil What should I tell my health care provider before I take this medicine? They need to know if you have any of these conditions:  blood disorders  dihydropyrimidine dehydrogenase (DPD) deficiency  infection (especially a virus infection such as chickenpox, cold sores, or herpes)  kidney disease  liver disease  malnourished, poor nutrition  recent or ongoing radiation therapy  an unusual or allergic reaction to fluorouracil, other chemotherapy, other medicines, foods, dyes, or preservatives  pregnant or trying to get pregnant  breast-feeding How should I use this medicine? This drug is given as an infusion or injection into a vein. It is administered in a hospital or clinic by a specially trained health care professional. Talk to your pediatrician regarding the use of this medicine in children. Special care may be needed. Overdosage: If you think you have taken too much of this medicine contact a poison control center or emergency room at once. NOTE: This medicine is only for you. Do not share this medicine with others. What if I miss a dose? It is important not to miss your dose. Call your doctor or health care professional if you are unable to keep an appointment. What may interact with this medicine?  allopurinol  cimetidine  dapsone  digoxin  hydroxyurea  leucovorin  levamisole  medicines for seizures like ethotoin, fosphenytoin, phenytoin  medicines to increase blood counts like filgrastim, pegfilgrastim, sargramostim  medicines that  treat or prevent blood clots like warfarin, enoxaparin, and dalteparin  methotrexate  metronidazole  pyrimethamine  some other chemotherapy drugs like busulfan, cisplatin, estramustine, vinblastine  trimethoprim  trimetrexate  vaccines Talk to your doctor or health care professional before taking any of these medicines:  acetaminophen  aspirin  ibuprofen  ketoprofen  naproxen This list may not describe all possible interactions. Give your health care provider a list of all the medicines, herbs, non-prescription drugs, or dietary supplements you use. Also tell them if you smoke, drink alcohol, or use illegal drugs. Some items may interact with your medicine. What should I watch for while using this medicine? Visit your doctor for checks on your progress. This drug may make you feel generally unwell. This is not uncommon, as chemotherapy can affect healthy cells as well as cancer cells. Report any side effects. Continue your course of treatment even though you feel ill unless your doctor tells you to stop. In some cases, you may be given additional medicines to help with side effects. Follow all directions for their use. Call your doctor or health care professional for advice if you get a fever, chills or sore throat, or other symptoms of a cold or flu. Do not treat yourself. This drug decreases your body's ability to fight infections. Try to avoid being around people who are sick. This medicine may increase your risk to bruise or bleed. Call your doctor or health care professional if you notice any unusual bleeding. Be careful brushing and flossing your teeth or using a toothpick because you may get an infection or bleed more easily. If you have any dental work done, tell your dentist you are   receiving this medicine. Avoid taking products that contain aspirin, acetaminophen, ibuprofen, naproxen, or ketoprofen unless instructed by your doctor. These medicines may hide a fever. Do not  become pregnant while taking this medicine. Women should inform their doctor if they wish to become pregnant or think they might be pregnant. There is a potential for serious side effects to an unborn child. Talk to your health care professional or pharmacist for more information. Do not breast-feed an infant while taking this medicine. Men should inform their doctor if they wish to father a child. This medicine may lower sperm counts. Do not treat diarrhea with over the counter products. Contact your doctor if you have diarrhea that lasts more than 2 days or if it is severe and watery. This medicine can make you more sensitive to the sun. Keep out of the sun. If you cannot avoid being in the sun, wear protective clothing and use sunscreen. Do not use sun lamps or tanning beds/booths. What side effects may I notice from receiving this medicine? Side effects that you should report to your doctor or health care professional as soon as possible:  allergic reactions like skin rash, itching or hives, swelling of the face, lips, or tongue  low blood counts - this medicine may decrease the number of white blood cells, red blood cells and platelets. You may be at increased risk for infections and bleeding.  signs of infection - fever or chills, cough, sore throat, pain or difficulty passing urine  signs of decreased platelets or bleeding - bruising, pinpoint red spots on the skin, black, tarry stools, blood in the urine  signs of decreased red blood cells - unusually weak or tired, fainting spells, lightheadedness  breathing problems  changes in vision  chest pain  mouth sores  nausea and vomiting  pain, swelling, redness at site where injected  pain, tingling, numbness in the hands or feet  redness, swelling, or sores on hands or feet  stomach pain  unusual bleeding Side effects that usually do not require medical attention (report to your doctor or health care professional if they  continue or are bothersome):  changes in finger or toe nails  diarrhea  dry or itchy skin  hair loss  headache  loss of appetite  sensitivity of eyes to the light  stomach upset  unusually teary eyes This list may not describe all possible side effects. Call your doctor for medical advice about side effects. You may report side effects to FDA at 1-800-FDA-1088. Where should I keep my medicine? This drug is given in a hospital or clinic and will not be stored at home. NOTE: This sheet is a summary. It may not cover all possible information. If you have questions about this medicine, talk to your doctor, pharmacist, or health care provider.  2020 Elsevier/Gold Standard (2007-08-14 13:53:16)  

## 2019-05-15 ENCOUNTER — Ambulatory Visit (INDEPENDENT_AMBULATORY_CARE_PROVIDER_SITE_OTHER): Payer: Medicare HMO | Admitting: Endocrinology

## 2019-05-15 ENCOUNTER — Encounter: Payer: Self-pay | Admitting: Endocrinology

## 2019-05-15 VITALS — BP 146/70 | HR 70 | Ht 71.0 in | Wt 181.6 lb

## 2019-05-15 DIAGNOSIS — Z794 Long term (current) use of insulin: Secondary | ICD-10-CM | POA: Diagnosis not present

## 2019-05-15 DIAGNOSIS — E1149 Type 2 diabetes mellitus with other diabetic neurological complication: Secondary | ICD-10-CM

## 2019-05-15 LAB — POCT GLYCOSYLATED HEMOGLOBIN (HGB A1C): Hemoglobin A1C: 6.5 % — AB (ref 4.0–5.6)

## 2019-05-15 MED ORDER — TRESIBA FLEXTOUCH 100 UNIT/ML ~~LOC~~ SOPN: 20.0000 [IU] | PEN_INJECTOR | Freq: Every day | SUBCUTANEOUS | 11 refills | Status: DC

## 2019-05-15 NOTE — Patient Instructions (Addendum)
Please reduce the Tresiba to 20 units daily.  However, take 28 on each chemo day.   Please come back for a follow-up appointment in 2 months.   Your blood pressure is high today.  Please see your primary care provider soon, to have it rechecked check your blood sugar twice a day.  vary the time of day when you check, between before the 3 meals, and at bedtime.  also check if you have symptoms of your blood sugar being too high or too low.  please keep a record of the readings and bring it to your next appointment here (or you can bring the meter itself).  You can write it on any piece of paper.  please call us sooner if your blood sugar goes below 70, or if you have a lot of readings over 200.

## 2019-05-15 NOTE — Progress Notes (Signed)
Subjective:    Patient ID: Tonya [name], female    DOB: 1940/07/03, 79 y.o.   MRN: [id number]  HPI Pt returns for f/u of diabetes mellitus: DM type: Insulin-requiring type 2 (but lean body habitus raises possibility she is evolving type 1).   Dx'ed: [id number] Complications: CVA Therapy: insulin since 2020, and Januvia    GDM: never DKA: never Severe hypoglycemia: never.   Pancreatitis: never Pancreatic imaging: normal on 2010 [place].  Other: she did not tolerate metformin-XR (abd pain); she also took insulin only for a brief time after diagnosis; edema limits rx options; she is not a candidate for aggressive glycemic control, given colon cancer Interval history: she brings a record of her cbg's which I have reviewed today.  cbg varies from 89-291.  It is in general higher as the day goes on, and for 1 day after each chemo tx.  chemo is planned intil 4/21.  She takes decadron just 1 day after each chemotx.  Pt says she can afford [place] for now.   Past Medical History:  Diagnosis Date  . Cerebrovascular accident ([place])   . Chronic edema    ? venous insufficiency  . COPD (chronic obstructive pulmonary disease) ([place])    never had a problem breathing  . Diabetes mellitus without complication ([place])   . Enlarged heart    per pt  . Gallstones   . GERD (gastroesophageal reflux disease)   . Goals of care, counseling/discussion 11/01/2018  . Hyperlipidemia   . Hypertension     Past Surgical History:  Procedure Laterality Date  . ABDOMINAL HYSTERECTOMY    . BIOPSY  10/18/2018   Procedure: BIOPSY;  Surgeon: Jackquline Denmark, MD;  Location: [facility] ENDOSCOPY;  Service: Endoscopy;;  . CHOLECYSTECTOMY    . COLONOSCOPY WITH PROPOFOL N/A 10/18/2018   Procedure: COLONOSCOPY WITH PROPOFOL;  Surgeon: Jackquline Denmark, MD;  Location: [facility] ENDOSCOPY;  Service: Endoscopy;  Laterality: N/A;  . IR IMAGING GUIDED PORT INSERTION  11/11/2018  . POLYPECTOMY  10/18/2018   Procedure: POLYPECTOMY;  Surgeon: Jackquline Denmark, MD;   Location[facility] ENDOSCOPY;  Service: Endoscopy;;  . SUBMUCOSAL TATTOO INJECTION  10/18/2018   Procedure: SUBMUCOSAL TATTOO INJECTION;  Surgeon: Jackquline Denmark, MD;  Location: [facility] ENDOSCOPY;  Service: Endoscopy;;    Social History   Socioeconomic History  . Marital status: Widowed    Spouse name: Not on file  . Number of children: 5  . Years of education: Not on file  . Highest education level: Not on file  Occupational History  . Not on file  Tobacco Use  . Smoking status: Former Smoker    Packs/day: 0.25    Years: 1.00    Pack years: 0.25    Quit date: 07/23/1968    Years since quitting: 50.8  . Smokeless tobacco: Never Used  Substance and Sexual Activity  . Alcohol use: No    Alcohol/week: 0.0 standard drinks  . Drug use: No  . Sexual activity: Not Currently  Other Topics Concern  . Not on file  Social History Narrative  . Not on file   Social Determinants of Health   Financial Resource Strain:   . Difficulty of Paying Living Expenses: Not on file  Food Insecurity:   . Worried About Charity fundraiser in the Last Year: Not on file  . Ran Out of Food in the Last Year: Not on file  Transportation Needs:   . Lack of Transportation (Medical): Not on file  . Lack of  Transportation (Non-Medical): Not on file  Physical Activity:   . Days of Exercise per Week: Not on file  . Minutes of Exercise per Session: Not on file  Stress:   . Feeling of Stress : Not on file  Social Connections:   . Frequency of Communication with Friends and Family: Not on file  . Frequency of Social Gatherings with Friends and Family: Not on file  . Attends Religious Services: Not on file  . Active Member of Clubs or Organizations: Not on file  . Attends Archivist Meetings: Not on file  . Marital Status: Not on file  Intimate Partner Violence:   . Fear of Current or Ex-Partner: Not on file  . Emotionally Abused: Not on file  . Physically Abused: Not on file  . Sexually Abused: Not on  file    Current Outpatient Medications on File Prior to Visit  Medication Sig Dispense Refill  . amLODipine (NORVASC) 10 MG tablet TAKE 1 TABLET(10 MG) BY MOUTH DAILY 90 tablet 0  . aspirin EC 81 MG tablet Take 81 mg by mouth daily.    Marland Kitchen atorvastatin (LIPITOR) 10 MG tablet TAKE 1 TABLET(10 MG) BY MOUTH DAILY (Patient taking differently: Take 10 mg by mouth daily. ) 90 tablet 1  . baclofen (LIORESAL) 10 MG tablet TAKE 1/2 TABLET(5 MG) BY MOUTH EVERY 8 HOURS AS NEEDED FOR MUSCLE SPASMS 30 tablet 2  . Cholecalciferol 2000 units TABS Take 1 tablet (2,000 Units total) by mouth daily. 90 tablet 1  . cyclobenzaprine (FEXMID) 7.5 MG tablet Take 1 tablet (7.5 mg total) by mouth 3 (three) times daily as needed for muscle spasms. 30 tablet 2  . dexamethasone (DECADRON) 4 MG tablet     . dronabinol (MARINOL) 2.5 MG capsule Take 1 capsule (2.5 mg total) by mouth 2 (two) times daily before a meal. 60 capsule 0  . Lancets (ONETOUCH DELICA PLUS 123XX123) MISC     . lidocaine (XYLOCAINE) 2 % solution     . lidocaine-prilocaine (EMLA) cream     . loperamide (IMODIUM A-D) 2 MG tablet Take 2 tablets (4 mg total) by mouth 4 (four) times daily as needed. Take 2 at diarrhea onset , then 1 every 2hr until 12hrs with no BM. May take 2 every 4hrs at night. If diarrhea recurs repeat. 100 tablet 1  . magic mouthwash SOLN Swish and swallow equal parts Benadryl, Lidocaine and Nystatin.  5- 10 ml.'s four times a day as needed. 240 mL 0  . meclizine (ANTIVERT) 12.5 MG tablet TAKE 1 TABLET(12.5 MG) BY MOUTH THREE TIMES DAILY AS NEEDED FOR DIZZINESS 30 tablet 0  . metoprolol tartrate (LOPRESSOR) 25 MG tablet TAKE 1 TABLET(25 MG) BY MOUTH TWICE DAILY 180 tablet 0  . olmesartan (BENICAR) 20 MG tablet TAKE 1 TABLET(20 MG) BY MOUTH DAILY 90 tablet 1  . temazepam (RESTORIL) 7.5 MG capsule Take 1 capsule (7.5 mg total) by mouth at bedtime as needed for sleep. 30 capsule 0  . traMADol (ULTRAM) 50 MG tablet Take 1 tablet (50 mg total)  by mouth every 6 (six) hours as needed. 30 tablet 0  . [DISCONTINUED] prochlorperazine (COMPAZINE) 10 MG tablet Take 1 tablet (10 mg total) by mouth every 6 (six) hours as needed (Nausea or vomiting). 30 tablet 1   No current facility-administered medications on file prior to visit.    Allergies  Allergen Reactions  . Hydrocodone Swelling and Other (See Comments)    "I started swelling, became red, and  passed out"  . Iohexol Hives, Shortness Of Breath and Other (See Comments)    Patient developed hives and fullness in throat post injection of 125cc's Omni 300, Onset Date: 11/15/2006   . Naproxen Other (See Comments)    "It made me feel out of my head"  . Ibuprofen Nausea Only  . Propoxyphene N-Acetaminophen Other (See Comments)    "I couldn't find the door to make my way out of the room- I was in misery"    Family History  Problem Relation Age of Onset  . Cirrhosis Mother   . Heart disease Father   . Cancer Other   . Hypertension Other   . Diabetes Other     BP (!) 146/70 (BP Location: Right Arm, Patient Position: Sitting, Cuff Size: Normal)   Pulse 70   Ht 5\' 11"  (1.803 m)   Wt 181 lb 9.6 oz (82.4 kg)   SpO2 99%   BMI 25.33 kg/m    Review of Systems She denies hypoglycemia    Objective:   Physical Exam VITAL SIGNS:  See vs page GENERAL: no distress.  In wheelchair. Pulses: dorsalis pedis intact bilat.   MSK: no deformity of the feet CV: no leg edema Skin:  no ulcer on the feet.  normal color and temp on the feet. Neuro: sensation is intact to touch on the feet  A1c=6.5%  Lab Results  Component Value Date   CREATININE 0.68 05/12/2019   BUN 11 05/12/2019   NA 142 05/12/2019   K 4.0 05/12/2019   CL 107 05/12/2019   CO2 29 05/12/2019      Assessment & Plan:  Insulin-requiring type 2 DM: overcontrolled, given this regimen, which does match insulin to her changing needs throughout the day.  Colon cancer: she is not a candidate for aggressive glycemic  control Steroid rx: she needs to adjust insulin more for this HTN: is noted today  Patient Instructions  Please reduce the Tresiba to 20 units daily.  However, take 28 on each chemo day.   Please come back for a follow-up appointment in 2 months.   Your blood pressure is high today.  Please see your primary care provider soon, to have it rechecked check your blood sugar twice a day.  vary the time of day when you check, between before the 3 meals, and at bedtime.  also check if you have symptoms of your blood sugar being too high or too low.  please keep a record of the readings and bring it to your next appointment here (or you can bring the meter itself).  You can write it on any piece of paper.  please call us sooner if your blood sugar goes below 70, or if you have a lot of readings over 200.

## 2019-05-16 DIAGNOSIS — L602 Onychogryphosis: Secondary | ICD-10-CM | POA: Diagnosis not present

## 2019-05-16 DIAGNOSIS — E1351 Other specified diabetes mellitus with diabetic peripheral angiopathy without gangrene: Secondary | ICD-10-CM | POA: Diagnosis not present

## 2019-05-16 DIAGNOSIS — R2689 Other abnormalities of gait and mobility: Secondary | ICD-10-CM | POA: Diagnosis not present

## 2019-05-16 DIAGNOSIS — M6281 Muscle weakness (generalized): Secondary | ICD-10-CM | POA: Diagnosis not present

## 2019-05-16 DIAGNOSIS — M205X1 Other deformities of toe(s) (acquired), right foot: Secondary | ICD-10-CM | POA: Diagnosis not present

## 2019-05-16 DIAGNOSIS — M205X2 Other deformities of toe(s) (acquired), left foot: Secondary | ICD-10-CM | POA: Diagnosis not present

## 2019-05-19 ENCOUNTER — Other Ambulatory Visit: Payer: Self-pay | Admitting: Family Medicine

## 2019-05-27 ENCOUNTER — Other Ambulatory Visit: Payer: Self-pay

## 2019-05-27 MED ORDER — DEXAMETHASONE 4 MG PO TABS: 8.0000 mg | ORAL_TABLET | Freq: Two times a day (BID) | ORAL | 0 refills | Status: DC

## 2019-05-27 MED ORDER — MAGIC MOUTHWASH: ORAL | 0 refills | Status: DC

## 2019-05-29 ENCOUNTER — Ambulatory Visit (INDEPENDENT_AMBULATORY_CARE_PROVIDER_SITE_OTHER): Payer: Medicare HMO | Admitting: Family Medicine

## 2019-05-29 ENCOUNTER — Ambulatory Visit: Payer: Medicare HMO | Admitting: Family Medicine

## 2019-05-29 ENCOUNTER — Encounter: Payer: Self-pay | Admitting: Family Medicine

## 2019-05-29 ENCOUNTER — Other Ambulatory Visit: Payer: Self-pay | Admitting: Family Medicine

## 2019-05-29 ENCOUNTER — Other Ambulatory Visit: Payer: Self-pay

## 2019-05-29 VITALS — BP 143/83 | HR 84 | Temp 98.1°F | Ht 71.0 in | Wt 186.6 lb

## 2019-05-29 DIAGNOSIS — I10 Essential (primary) hypertension: Secondary | ICD-10-CM

## 2019-05-29 DIAGNOSIS — G8929 Other chronic pain: Secondary | ICD-10-CM

## 2019-05-29 DIAGNOSIS — E785 Hyperlipidemia, unspecified: Secondary | ICD-10-CM

## 2019-05-29 DIAGNOSIS — C189 Malignant neoplasm of colon, unspecified: Secondary | ICD-10-CM | POA: Diagnosis not present

## 2019-05-29 DIAGNOSIS — M25551 Pain in right hip: Secondary | ICD-10-CM | POA: Diagnosis not present

## 2019-05-29 DIAGNOSIS — C787 Secondary malignant neoplasm of liver and intrahepatic bile duct: Secondary | ICD-10-CM | POA: Diagnosis not present

## 2019-05-29 MED ORDER — ATORVASTATIN CALCIUM 10 MG PO TABS: 10.0000 mg | ORAL_TABLET | Freq: Every day | ORAL | 1 refills | Status: DC

## 2019-05-29 MED ORDER — LOSARTAN POTASSIUM 50 MG PO TABS: ORAL_TABLET | ORAL | 1 refills | Status: DC

## 2019-05-29 NOTE — Progress Notes (Signed)
Established Patient Office Visit  Subjective:  Patient ID: Tonya [name], female    DOB: [birth year]  Age: 79 y.o. MRN: [id number]  CC:  Chief Complaint  Patient presents with  . Sinus Problem    occasional blood in mucos  . Flank Pain    possible from chemo  . Hypertension    occasionally    HPI Tonya [name] presents for pain in the lower right side over the past Pt states feels more with movement in the lower right side-to the right when she gets in bed-weak left side due to CVA  Sores in the mouth-resolved-chemo 3 weeks ago  Blood in mucous in the morning-dryness-steam from heat helps  HTN-request switch from benicar due to cost-high co-pay to another medication  Past Medical History:  Diagnosis Date  . Cerebrovascular accident ([place])   . Chronic edema    ? venous insufficiency  . COPD (chronic obstructive pulmonary disease) ([place])    never had a problem breathing  . Diabetes mellitus without complication ([place])   . Enlarged heart    per pt  . Gallstones   . GERD (gastroesophageal reflux disease)   . Goals of care, counseling/discussion 11/01/2018  . Hyperlipidemia   . Hypertension     Past Surgical History:  Procedure Laterality Date  . ABDOMINAL HYSTERECTOMY    . BIOPSY  10/18/2018   Procedure: BIOPSY;  Surgeon: Jackquline Denmark, MD;  Location: [facility] ENDOSCOPY;  Service: Endoscopy;;  . CHOLECYSTECTOMY    . COLONOSCOPY WITH PROPOFOL N/A 10/18/2018   Procedure: COLONOSCOPY WITH PROPOFOL;  Surgeon: Jackquline Denmark, MD;  Location: [facility] ENDOSCOPY;  Service: Endoscopy;  Laterality: N/A;  . IR IMAGING GUIDED PORT INSERTION  11/11/2018  . POLYPECTOMY  10/18/2018   Procedure: POLYPECTOMY;  Surgeon: Jackquline Denmark, MD;  Location: [facility] ENDOSCOPY;  Service: Endoscopy;;  . SUBMUCOSAL TATTOO INJECTION  10/18/2018   Procedure: SUBMUCOSAL TATTOO INJECTION;  Surgeon: Jackquline Denmark, MD;  Location: [facility] ENDOSCOPY;  Service: Endoscopy;;    Family History  Problem Relation Age of Onset  .  Cirrhosis Mother   . Heart disease Father   . Cancer Other   . Hypertension Other   . Diabetes Other     Social History   Socioeconomic History  . Marital status: Widowed    Spouse name: Not on file  . Number of children: 5  . Years of education: Not on file  . Highest education level: Not on file  Occupational History  . Not on file  Tobacco Use  . Smoking status: Former Smoker    Packs/day: 0.25    Years: 1.00    Pack years: 0.25    Quit date: 07/23/1968    Years since quitting: 50.8  . Smokeless tobacco: Never Used  Substance and Sexual Activity  . Alcohol use: No    Alcohol/week: 0.0 standard drinks  . Drug use: No  . Sexual activity: Not Currently  Other Topics Concern  . Not on file  Social History Narrative  . Not on file   Social Determinants of Health   Financial Resource Strain:   . Difficulty of Paying Living Expenses: Not on file  Food Insecurity:   . Worried About Charity fundraiser in the Last Year: Not on file  . Ran Out of Food in the Last Year: Not on file  Transportation Needs:   . Lack of Transportation (Medical): Not on file  . Lack of Transportation (Non-Medical): Not on file  Physical Activity:   .  Days of Exercise per Week: Not on file  . Minutes of Exercise per Session: Not on file  Stress:   . Feeling of Stress : Not on file  Social Connections:   . Frequency of Communication with Friends and Family: Not on file  . Frequency of Social Gatherings with Friends and Family: Not on file  . Attends Religious Services: Not on file  . Active Member of Clubs or Organizations: Not on file  . Attends Archivist Meetings: Not on file  . Marital Status: Not on file  Intimate Partner Violence:   . Fear of Current or Ex-Partner: Not on file  . Emotionally Abused: Not on file  . Physically Abused: Not on file  . Sexually Abused: Not on file    Outpatient Medications Prior to Visit  Medication Sig Dispense Refill  . amLODipine  (NORVASC) 10 MG tablet TAKE 1 TABLET(10 MG) BY MOUTH DAILY 90 tablet 0  . aspirin EC 81 MG tablet Take 81 mg by mouth daily.    Marland Kitchen atorvastatin (LIPITOR) 10 MG tablet TAKE 1 TABLET(10 MG) BY MOUTH DAILY (Patient taking differently: Take 10 mg by mouth daily. ) 90 tablet 1  . baclofen (LIORESAL) 10 MG tablet TAKE 1/2 TABLET(5 MG) BY MOUTH EVERY 8 HOURS AS NEEDED FOR MUSCLE SPASMS 30 tablet 2  . Cholecalciferol 2000 units TABS Take 1 tablet (2,000 Units total) by mouth daily. 90 tablet 1  . cyclobenzaprine (FEXMID) 7.5 MG tablet Take 1 tablet (7.5 mg total) by mouth 3 (three) times daily as needed for muscle spasms. 30 tablet 2  . dexamethasone (DECADRON) 4 MG tablet Take 2 tablets (8 mg total) by mouth 2 (two) times daily. For four days following chemotherapy. 30 tablet 0  . dronabinol (MARINOL) 2.5 MG capsule Take 1 capsule (2.5 mg total) by mouth 2 (two) times daily before a meal. 60 capsule 0  . insulin degludec (TRESIBA FLEXTOUCH) 100 UNIT/ML SOPN FlexTouch Pen Inject 0.2 mLs (20 Units total) into the skin daily. And 4 mm pen needles 1/day. 5 pen 11  . Lancets (ONETOUCH DELICA PLUS [id number]) MISC     . lidocaine (XYLOCAINE) 2 % solution     . lidocaine-prilocaine (EMLA) cream     . loperamide (IMODIUM A-D) 2 MG tablet Take 2 tablets (4 mg total) by mouth 4 (four) times daily as needed. Take 2 at diarrhea onset , then 1 every 2hr until 12hrs with no BM. May take 2 every 4hrs at night. If diarrhea recurs repeat. 100 tablet 1  . magic mouthwash SOLN Swish and swallow equal parts Benadryl, Lidocaine and Nystatin.  5- 10 ml.'s four times a day as needed. 240 mL 0  . meclizine (ANTIVERT) 12.5 MG tablet TAKE 1 TABLET(12.5 MG) BY MOUTH THREE TIMES DAILY AS NEEDED FOR DIZZINESS 30 tablet 0  . metoprolol tartrate (LOPRESSOR) 25 MG tablet TAKE 1 TABLET(25 MG) BY MOUTH TWICE DAILY 180 tablet 0  . olmesartan (BENICAR) 20 MG tablet Take 1 tablet (20 mg total) by mouth daily. 90 tablet 0  . temazepam (RESTORIL)  7.5 MG capsule Take 1 capsule (7.5 mg total) by mouth at bedtime as needed for sleep. 30 capsule 0  . traMADol (ULTRAM) 50 MG tablet Take 1 tablet (50 mg total) by mouth every 6 (six) hours as needed. (Patient not taking: Reported on 05/29/2019) 30 tablet 0   No facility-administered medications prior to visit.    Allergies  Allergen Reactions  . Hydrocodone Swelling and Other (See  Comments)    "I started swelling, became red, and passed out"  . Iohexol Hives, Shortness Of Breath and Other (See Comments)    Patient developed hives and fullness in throat post injection of 125cc's Omni 300, Onset Date: 11/15/2006   . Naproxen Other (See Comments)    "It made me feel out of my head"  . Ibuprofen Nausea Only  . Propoxyphene N-Acetaminophen Other (See Comments)    "I couldn't find the door to make my way out of the room- I was in misery"    ROS Review of Systems  Constitutional: Positive for fatigue. Negative for fever.  HENT: Positive for mouth sores.   Respiratory: Negative.   Cardiovascular: Negative.   Gastrointestinal: Negative.   Musculoskeletal: Positive for myalgias.  Neurological: Negative for dizziness and headaches.      Objective:    Physical Exam  Constitutional: She appears well-developed and well-nourished.  HENT:  Head: Normocephalic and atraumatic.  Cardiovascular: Normal rate and regular rhythm.  Pulmonary/Chest: Effort normal and breath sounds normal.  Abdominal:  Right lateral chest wall -inferior to lateral rib cage-pain with rotational movement, no skin changes noted  Psychiatric: She has a normal mood and affect.    BP (!) 143/83 (BP Location: Left Arm, Patient Position: Sitting, Cuff Size: Normal)   Pulse 84   Temp 98.1 F (36.7 C) (Oral)   Ht 5\' 11"  (1.803 m)   Wt 186 lb 9.6 oz (84.6 kg)   SpO2 97%   BMI 26.03 kg/m  Wt Readings from Last 3 Encounters:  05/29/19 186 lb 9.6 oz (84.6 kg)  05/15/19 181 lb 9.6 oz (82.4 kg)  05/12/19 179 lb (81.2  kg)     Health Maintenance Due  Topic Date Due  . PNA vac Low Risk Adult (1 of 2 - PCV13) 05/14/2003  . OPHTHALMOLOGY EXAM  01/15/2019  . TETANUS/TDAP  04/25/2019     Lab Results  Component Value Date   TSH 2.420 03/30/2018   Lab Results  Component Value Date   WBC 4.5 05/12/2019   HGB 11.1 (L) 05/12/2019   HCT 35.6 (L) 05/12/2019   MCV 89.4 05/12/2019   PLT 196 05/12/2019   Lab Results  Component Value Date   NA 142 05/12/2019   K 4.0 05/12/2019   CO2 29 05/12/2019   GLUCOSE 150 (H) 05/12/2019   BUN 11 05/12/2019   CREATININE 0.68 05/12/2019   BILITOT 0.6 05/12/2019   ALKPHOS 81 05/12/2019   AST 22 05/12/2019   ALT 16 05/12/2019   PROT 5.5 (L) 05/12/2019   ALBUMIN 3.5 05/12/2019   CALCIUM 9.4 05/12/2019   ANIONGAP 6 05/12/2019   GFR 94.31 10/10/2017   Lab Results  Component Value Date   CHOL 137 03/30/2018   Lab Results  Component Value Date   HDL 49 03/30/2018   Lab Results  Component Value Date   LDLCALC 72 03/30/2018   Lab Results  Component Value Date   TRIG 80 03/30/2018   Lab Results  Component Value Date   CHOLHDL 2.8 03/30/2018   Lab Results  Component Value Date   HGBA1C 6.5 (A) 05/15/2019      Assessment & Plan:  1. Essential hypertension Start Losartan in the morning and amlodipine at night-check blood pressure in the morning and the evening 2. Hyperlipidemia with target LDL less than 100 - atorvastatin (LIPITOR) 10 MG tablet; Take 1 tablet (10 mg total) by mouth daily.  Dispense: 90 tablet; Refill: 1 - Lipid panel  3. Colon cancer metastasized to liver [facility]) Oncology following 4. Chronic right hip pain Tenderness right lateral chest wall-heating pad  Recommended saline spray for nasal irritation 50 minutes discussion of current concerns, history from oncology with side effects to chemo, current concern with medication and new symptoms Jaiyanna Safran Hannah Beat, MD

## 2019-05-29 NOTE — Patient Instructions (Addendum)
Nasal saline spray or gel in the nostrils  Amlodipine 10mg -take at night  Switch Losartan 50mg  for Benicar 20mg -Take in the morning  Lipid panel with blood draw -fasting

## 2019-06-02 ENCOUNTER — Inpatient Hospital Stay: Payer: Medicare HMO

## 2019-06-02 ENCOUNTER — Inpatient Hospital Stay: Payer: Medicare HMO | Attending: Hematology & Oncology

## 2019-06-02 ENCOUNTER — Other Ambulatory Visit: Payer: Self-pay

## 2019-06-02 ENCOUNTER — Inpatient Hospital Stay (HOSPITAL_BASED_OUTPATIENT_CLINIC_OR_DEPARTMENT_OTHER): Payer: Medicare HMO | Admitting: Hematology & Oncology

## 2019-06-02 DIAGNOSIS — C189 Malignant neoplasm of colon, unspecified: Secondary | ICD-10-CM

## 2019-06-02 DIAGNOSIS — C182 Malignant neoplasm of ascending colon: Secondary | ICD-10-CM | POA: Diagnosis present

## 2019-06-02 DIAGNOSIS — Z794 Long term (current) use of insulin: Secondary | ICD-10-CM | POA: Insufficient documentation

## 2019-06-02 DIAGNOSIS — Z5111 Encounter for antineoplastic chemotherapy: Secondary | ICD-10-CM | POA: Diagnosis present

## 2019-06-02 DIAGNOSIS — Z7952 Long term (current) use of systemic steroids: Secondary | ICD-10-CM | POA: Diagnosis not present

## 2019-06-02 DIAGNOSIS — C787 Secondary malignant neoplasm of liver and intrahepatic bile duct: Secondary | ICD-10-CM

## 2019-06-02 DIAGNOSIS — Z5112 Encounter for antineoplastic immunotherapy: Secondary | ICD-10-CM | POA: Diagnosis not present

## 2019-06-02 DIAGNOSIS — D500 Iron deficiency anemia secondary to blood loss (chronic): Secondary | ICD-10-CM | POA: Diagnosis not present

## 2019-06-02 DIAGNOSIS — D509 Iron deficiency anemia, unspecified: Secondary | ICD-10-CM | POA: Insufficient documentation

## 2019-06-02 DIAGNOSIS — Z79899 Other long term (current) drug therapy: Secondary | ICD-10-CM | POA: Diagnosis not present

## 2019-06-02 DIAGNOSIS — Z7982 Long term (current) use of aspirin: Secondary | ICD-10-CM | POA: Diagnosis not present

## 2019-06-02 LAB — CMP (CANCER CENTER ONLY)
ALT: 12 U/L (ref 0–44)
AST: 14 U/L — ABNORMAL LOW (ref 15–41)
Albumin: 3.6 g/dL (ref 3.5–5.0)
Alkaline Phosphatase: 75 U/L (ref 38–126)
Anion gap: 7 (ref 5–15)
BUN: 16 mg/dL (ref 8–23)
CO2: 29 mmol/L (ref 22–32)
Calcium: 9.5 mg/dL (ref 8.9–10.3)
Chloride: 107 mmol/L (ref 98–111)
Creatinine: 0.75 mg/dL (ref 0.44–1.00)
GFR, Est AFR Am: >60 mL/min (ref >60)
GFR, Estimated: >60 mL/min (ref >60)
Glucose, Bld: 160 mg/dL — ABNORMAL HIGH (ref 70–99)
Potassium: 3.8 mmol/L (ref 3.5–5.1)
Sodium: 143 mmol/L (ref 135–145)
Total Bilirubin: 0.6 mg/dL (ref 0.3–1.2)
Total Protein: 5.8 g/dL — ABNORMAL LOW (ref 6.5–8.1)

## 2019-06-02 LAB — CBC WITH DIFFERENTIAL (CANCER CENTER ONLY)
Abs Immature Granulocytes: 0.02 10*3/uL (ref 0.00–0.07)
Basophils Absolute: 0 10*3/uL (ref 0.0–0.1)
Basophils Relative: 0 %
Eosinophils Absolute: 0 10*3/uL (ref 0.0–0.5)
Eosinophils Relative: 1 %
HCT: 37.6 % (ref 36.0–46.0)
Hemoglobin: 11.7 g/dL — ABNORMAL LOW (ref 12.0–15.0)
Immature Granulocytes: 1 %
Lymphocytes Relative: 40 %
Lymphs Abs: 1.6 10*3/uL (ref 0.7–4.0)
MCH: 27.9 pg (ref 26.0–34.0)
MCHC: 31.1 g/dL (ref 30.0–36.0)
MCV: 89.5 fL (ref 80.0–100.0)
Monocytes Absolute: 0.7 10*3/uL (ref 0.1–1.0)
Monocytes Relative: 17 %
Neutro Abs: 1.6 10*3/uL — ABNORMAL LOW (ref 1.7–7.7)
Neutrophils Relative %: 41 %
Platelet Count: 184 10*3/uL (ref 150–400)
RBC: 4.2 MIL/uL (ref 3.87–5.11)
RDW: 14.7 % (ref 11.5–15.5)
WBC Count: 3.9 10*3/uL — ABNORMAL LOW (ref 4.0–10.5)
nRBC: 0 % (ref 0.0–0.2)

## 2019-06-02 LAB — CEA (IN HOUSE-CHCC): CEA (CHCC-In House): 3.08 ng/mL (ref 0.00–5.00)

## 2019-06-02 MED ORDER — SODIUM CHLORIDE 0.9% FLUSH
10.0000 mL | INTRAVENOUS | Status: DC | PRN
  Filled 2019-06-02: qty 10

## 2019-06-02 MED ORDER — SODIUM CHLORIDE 0.9 % IV SOLN
400.0000 mg/m2 | Freq: Once | INTRAVENOUS | Status: AC
  Administered 2019-06-02: 784 mg via INTRAVENOUS
  Filled 2019-06-02: qty 39.2

## 2019-06-02 MED ORDER — DEXAMETHASONE SODIUM PHOSPHATE 10 MG/ML IJ SOLN
INTRAMUSCULAR | Status: AC
  Filled 2019-06-02: qty 1

## 2019-06-02 MED ORDER — SODIUM CHLORIDE 0.9 % IV SOLN
Freq: Once | INTRAVENOUS | Status: AC
  Filled 2019-06-02: qty 250

## 2019-06-02 MED ORDER — SODIUM CHLORIDE 0.9 % IV SOLN
135.0000 mg/m2 | Freq: Once | INTRAVENOUS | Status: AC
  Administered 2019-06-02: 260 mg via INTRAVENOUS
  Filled 2019-06-02: qty 5

## 2019-06-02 MED ORDER — SODIUM CHLORIDE 0.9 % IV SOLN
5.0000 mg/kg | Freq: Once | INTRAVENOUS | Status: AC
  Administered 2019-06-02: 400 mg via INTRAVENOUS
  Filled 2019-06-02: qty 16

## 2019-06-02 MED ORDER — ATROPINE SULFATE 1 MG/ML IJ SOLN: 0.5000 mg | Freq: Once | INTRAMUSCULAR | Status: DC | PRN

## 2019-06-02 MED ORDER — HEPARIN SOD (PORK) LOCK FLUSH 100 UNIT/ML IV SOLN
500.0000 [IU] | Freq: Once | INTRAVENOUS | Status: DC | PRN
  Filled 2019-06-02: qty 5

## 2019-06-02 MED ORDER — DEXAMETHASONE SODIUM PHOSPHATE 10 MG/ML IJ SOLN
10.0000 mg | Freq: Once | INTRAMUSCULAR | Status: AC
  Administered 2019-06-02: 10 mg via INTRAVENOUS

## 2019-06-02 MED ORDER — PALONOSETRON HCL INJECTION 0.25 MG/5ML
0.2500 mg | Freq: Once | INTRAVENOUS | Status: AC
  Administered 2019-06-02: 0.25 mg via INTRAVENOUS

## 2019-06-02 MED ORDER — SODIUM CHLORIDE 0.9 % IV SOLN
1920.0000 mg/m2 | INTRAVENOUS | Status: DC
  Administered 2019-06-02: 3750 mg via INTRAVENOUS
  Filled 2019-06-02: qty 75

## 2019-06-02 MED ORDER — PALONOSETRON HCL INJECTION 0.25 MG/5ML
INTRAVENOUS | Status: AC
  Filled 2019-06-02: qty 5

## 2019-06-02 MED ORDER — FLUOROURACIL CHEMO INJECTION 2.5 GM/50ML
320.0000 mg/m2 | Freq: Once | INTRAVENOUS | Status: AC
  Administered 2019-06-02: 650 mg via INTRAVENOUS
  Filled 2019-06-02: qty 13

## 2019-06-02 NOTE — Patient Instructions (Signed)
Fluorouracil, 5-FU injection What is this medicine? FLUOROURACIL, 5-FU (flure oh YOOR a sil) is a chemotherapy drug. It slows the growth of cancer cells. This medicine is used to treat many types of cancer like breast cancer, colon or rectal cancer, pancreatic cancer, and stomach cancer. This medicine may be used for other purposes; ask your health care provider or pharmacist if you have questions. COMMON BRAND NAME(S): Adrucil What should I tell my health care provider before I take this medicine? They need to know if you have any of these conditions:  blood disorders  dihydropyrimidine dehydrogenase (DPD) deficiency  infection (especially a virus infection such as chickenpox, cold sores, or herpes)  kidney disease  liver disease  malnourished, poor nutrition  recent or ongoing radiation therapy  an unusual or allergic reaction to fluorouracil, other chemotherapy, other medicines, foods, dyes, or preservatives  pregnant or trying to get pregnant  breast-feeding How should I use this medicine? This drug is given as an infusion or injection into a vein. It is administered in a hospital or clinic by a specially trained health care professional. Talk to your pediatrician regarding the use of this medicine in children. Special care may be needed. Overdosage: If you think you have taken too much of this medicine contact a poison control center or emergency room at once. NOTE: This medicine is only for you. Do not share this medicine with others. What if I miss a dose? It is important not to miss your dose. Call your doctor or health care professional if you are unable to keep an appointment. What may interact with this medicine?  allopurinol  cimetidine  dapsone  digoxin  hydroxyurea  leucovorin  levamisole  medicines for seizures like ethotoin, fosphenytoin, phenytoin  medicines to increase blood counts like filgrastim, pegfilgrastim, sargramostim  medicines that  treat or prevent blood clots like warfarin, enoxaparin, and dalteparin  methotrexate  metronidazole  pyrimethamine  some other chemotherapy drugs like busulfan, cisplatin, estramustine, vinblastine  trimethoprim  trimetrexate  vaccines Talk to your doctor or health care professional before taking any of these medicines:  acetaminophen  aspirin  ibuprofen  ketoprofen  naproxen This list may not describe all possible interactions. Give your health care provider a list of all the medicines, herbs, non-prescription drugs, or dietary supplements you use. Also tell them if you smoke, drink alcohol, or use illegal drugs. Some items may interact with your medicine. What should I watch for while using this medicine? Visit your doctor for checks on your progress. This drug may make you feel generally unwell. This is not uncommon, as chemotherapy can affect healthy cells as well as cancer cells. Report any side effects. Continue your course of treatment even though you feel ill unless your doctor tells you to stop. In some cases, you may be given additional medicines to help with side effects. Follow all directions for their use. Call your doctor or health care professional for advice if you get a fever, chills or sore throat, or other symptoms of a cold or flu. Do not treat yourself. This drug decreases your body's ability to fight infections. Try to avoid being around people who are sick. This medicine may increase your risk to bruise or bleed. Call your doctor or health care professional if you notice any unusual bleeding. Be careful brushing and flossing your teeth or using a toothpick because you may get an infection or bleed more easily. If you have any dental work done, tell your dentist you are   receiving this medicine. Avoid taking products that contain aspirin, acetaminophen, ibuprofen, naproxen, or ketoprofen unless instructed by your doctor. These medicines may hide a fever. Do not  become pregnant while taking this medicine. Women should inform their doctor if they wish to become pregnant or think they might be pregnant. There is a potential for serious side effects to an unborn child. Talk to your health care professional or pharmacist for more information. Do not breast-feed an infant while taking this medicine. Men should inform their doctor if they wish to father a child. This medicine may lower sperm counts. Do not treat diarrhea with over the counter products. Contact your doctor if you have diarrhea that lasts more than 2 days or if it is severe and watery. This medicine can make you more sensitive to the sun. Keep out of the sun. If you cannot avoid being in the sun, wear protective clothing and use sunscreen. Do not use sun lamps or tanning beds/booths. What side effects may I notice from receiving this medicine? Side effects that you should report to your doctor or health care professional as soon as possible:  allergic reactions like skin rash, itching or hives, swelling of the face, lips, or tongue  low blood counts - this medicine may decrease the number of white blood cells, red blood cells and platelets. You may be at increased risk for infections and bleeding.  signs of infection - fever or chills, cough, sore throat, pain or difficulty passing urine  signs of decreased platelets or bleeding - bruising, pinpoint red spots on the skin, black, tarry stools, blood in the urine  signs of decreased red blood cells - unusually weak or tired, fainting spells, lightheadedness  breathing problems  changes in vision  chest pain  mouth sores  nausea and vomiting  pain, swelling, redness at site where injected  pain, tingling, numbness in the hands or feet  redness, swelling, or sores on hands or feet  stomach pain  unusual bleeding Side effects that usually do not require medical attention (report to your doctor or health care professional if they  continue or are bothersome):  changes in finger or toe nails  diarrhea  dry or itchy skin  hair loss  headache  loss of appetite  sensitivity of eyes to the light  stomach upset  unusually teary eyes This list may not describe all possible side effects. Call your doctor for medical advice about side effects. You may report side effects to FDA at 1-800-FDA-1088. Where should I keep my medicine? This drug is given in a hospital or clinic and will not be stored at home. NOTE: This sheet is a summary. It may not cover all possible information. If you have questions about this medicine, talk to your doctor, pharmacist, or health care provider.  2020 Elsevier/Gold Standard (2007-08-14 13:53:16)  

## 2019-06-02 NOTE — Progress Notes (Signed)
Hematology and Oncology Follow Up Visit  ALVETA QUINTELA [id number] Dec 26, 1940 79 y.o. 06/02/2019   Principle Diagnosis:  Stage IV adenocarcinoma of the ascending colon -- NRAS+, BRAF-, HER2-, MSI/MMR -,  Iron deficiency anemia  Past Therapy: FOLFOX - s/p cycle 4  Current Therapy:   FOLFIRI/Bevacizumab -started 01/20/2019, s/p cycle #7 IV Iron as indicated   Interim History:  Ms. Massingale is here today with her daughter for follow-up.   She actually is doing pretty well.  She really had no problems over the weekend.  She has been eating pretty well.  She has had little bit of nasal discharge.  I told her to try a saline nasal spray for this.  Her last CEA level was 2.8.  This is holding nicely steady.  There is been no cough.  She has had no problems with abdominal pain.  There is been no diarrhea.  She has had no issues with respect to the stroke that she has had.  Overall, I would say her performance status is ECOG 1.     Medications:  Allergies as of 06/02/2019      Reactions   Hydrocodone Swelling, Other (See Comments)   "I started swelling, became red, and passed out"   Iohexol Hives, Shortness Of Breath, Other (See Comments)   Patient developed hives and fullness in throat post injection of 125cc's Omni 300, Onset Date: 11/15/2006   Naproxen Other (See Comments)   "It made me feel out of my head"   Ibuprofen Nausea Only   Propoxyphene N-acetaminophen Other (See Comments)   "I couldn't find the door to make my way out of the room- I was in misery"      Medication List       Accurate as of June 02, 2019  9:44 AM. If you have any questions, ask your nurse or doctor.        amLODipine 10 MG tablet Commonly known as: NORVASC TAKE 1 TABLET(10 MG) BY MOUTH DAILY   aspirin EC 81 MG tablet Take 81 mg by mouth daily.   atorvastatin 10 MG tablet Commonly known as: LIPITOR Take 1 tablet (10 mg total) by mouth daily.   baclofen 10 MG tablet Commonly known as:  LIORESAL TAKE 1/2 TABLET(5 MG) BY MOUTH EVERY 8 HOURS AS NEEDED FOR MUSCLE SPASMS   Cholecalciferol 50 MCG (2000 UT) Tabs Take 1 tablet (2,000 Units total) by mouth daily.   cyclobenzaprine 7.5 MG tablet Commonly known as: FEXMID Take 1 tablet (7.5 mg total) by mouth 3 (three) times daily as needed for muscle spasms.   dexamethasone 4 MG tablet Commonly known as: DECADRON Take 2 tablets (8 mg total) by mouth 2 (two) times daily. For four days following chemotherapy.   dronabinol 2.5 MG capsule Commonly known as: MARINOL Take 1 capsule (2.5 mg total) by mouth 2 (two) times daily before a meal.   lidocaine 2 % solution Commonly known as: XYLOCAINE   lidocaine-prilocaine cream Commonly known as: EMLA   loperamide 2 MG tablet Commonly known as: Imodium A-D Take 2 tablets (4 mg total) by mouth 4 (four) times daily as needed. Take 2 at diarrhea onset , then 1 every 2hr until 12hrs with no BM. May take 2 every 4hrs at night. If diarrhea recurs repeat.   losartan 50 MG tablet Commonly known as: COZAAR TAKE 1 TABLET BY MOUTH IN THE MORNING   magic mouthwash Soln Swish and swallow equal parts Benadryl, Lidocaine and Nystatin.  5- 10 ml.'s  four times a day as needed.   meclizine 12.5 MG tablet Commonly known as: ANTIVERT TAKE 1 TABLET(12.5 MG) BY MOUTH THREE TIMES DAILY AS NEEDED FOR DIZZINESS   metoprolol tartrate 25 MG tablet Commonly known as: LOPRESSOR TAKE 1 TABLET(25 MG) BY MOUTH TWICE DAILY   OneTouch Delica Plus [id number] Misc   temazepam 7.5 MG capsule Commonly known as: Restoril Take 1 capsule (7.5 mg total) by mouth at bedtime as needed for sleep.   Tyler Aas FlexTouch 100 UNIT/ML Sopn FlexTouch Pen Generic drug: insulin degludec Inject 0.2 mLs (20 Units total) into the skin daily. And 4 mm pen needles 1/day.       Allergies:  Allergies  Allergen Reactions  . Hydrocodone Swelling and Other (See Comments)    "I started swelling, became red, and passed out"  .  Iohexol Hives, Shortness Of Breath and Other (See Comments)    Patient developed hives and fullness in throat post injection of 125cc's Omni 300, Onset Date: 11/15/2006   . Naproxen Other (See Comments)    "It made me feel out of my head"  . Ibuprofen Nausea Only  . Propoxyphene N-Acetaminophen Other (See Comments)    "I couldn't find the door to make my way out of the room- I was in misery"    Past Medical History, Surgical history, Social history, and Family History were reviewed and updated.  Review of Systems: Review of Systems  Constitutional: Negative.   HENT: Negative.   Eyes: Negative.   Respiratory: Negative.   Cardiovascular: Negative.   Gastrointestinal: Positive for abdominal pain.  Genitourinary: Negative.   Musculoskeletal: Positive for back pain.  Skin: Negative.   Neurological: Negative.   Endo/Heme/Allergies: Negative.   Psychiatric/Behavioral: The patient has insomnia.       Physical Exam:  vitals were not taken for this visit.   Wt Readings from Last 3 Encounters:  06/02/19 181 lb (82.1 kg)  05/29/19 186 lb 9.6 oz (84.6 kg)  05/15/19 181 lb 9.6 oz (82.4 kg)    Physical Exam Vitals reviewed.  HENT:     Head: Normocephalic and atraumatic.  Eyes:     Pupils: Pupils are equal, round, and reactive to light.  Cardiovascular:     Rate and Rhythm: Normal rate and regular rhythm.     Heart sounds: Normal heart sounds.  Pulmonary:     Effort: Pulmonary effort is normal.     Breath sounds: Normal breath sounds.  Abdominal:     General: Bowel sounds are normal.     Palpations: Abdomen is soft.     Comments: Abdominal exam is soft.  She has good bowel sounds.  There is no fluid wave.  There is no palpable liver or spleen tip.  Musculoskeletal:        General: No tenderness or deformity. Normal range of motion.     Cervical back: Normal range of motion.  Lymphadenopathy:     Cervical: No cervical adenopathy.  Skin:    General: Skin is warm and dry.      Findings: No erythema or rash.  Neurological:     Mental Status: She is alert and oriented to person, place, and time.     Comments: She has chronic weakness on her left side.  This is from a past CVA.  Psychiatric:        Behavior: Behavior normal.        Thought Content: Thought content normal.        Judgment: Judgment normal.  Lab Results  Component Value Date   WBC 3.9 (L) 06/02/2019   HGB 11.7 (L) 06/02/2019   HCT 37.6 06/02/2019   MCV 89.5 06/02/2019   PLT 184 06/02/2019   Lab Results  Component Value Date   FERRITIN 477 (H) 05/12/2019   IRON 46 05/12/2019   TIBC 213 (L) 05/12/2019   UIBC 167 05/12/2019   IRONPCTSAT 22 05/12/2019   Lab Results  Component Value Date   RETICCTPCT 1.5 05/17/2016   RBC 4.20 06/02/2019   RETICCTABS 62,400 05/17/2016   No results found for: KPAFRELGTCHN, LAMBDASER, KAPLAMBRATIO No results found for: IGGSERUM, IGA, IGMSERUM No results found for: Odetta Pink, SPEI   Chemistry      Component Value Date/Time   NA 142 05/12/2019 0930   NA 139 09/30/2018 1311   K 4.0 05/12/2019 0930   CL 107 05/12/2019 0930   CO2 29 05/12/2019 0930   BUN 11 05/12/2019 0930   BUN 10 09/30/2018 1311   CREATININE 0.68 05/12/2019 0930      Component Value Date/Time   CALCIUM 9.4 05/12/2019 0930   ALKPHOS 81 05/12/2019 0930   AST 22 05/12/2019 0930   ALT 16 05/12/2019 0930   BILITOT 0.6 05/12/2019 0930       Impression and Plan: Ms. Joplin is a very pleasant 79 yo African American female with metastatic colon cancer.   We will go ahead with her eighth cycle of treatment today.  Again, I really think that the Avastin has been very helpful.  Her last PET scan was back in mid December, right before Christmas.  I will set her up with another PET scan before her ninth cycle of treatment.  I think that the 3-week cycle intervals have worked quite well for her.    [name],  MD 2/8/20219:44 AM

## 2019-06-04 ENCOUNTER — Other Ambulatory Visit: Payer: Self-pay

## 2019-06-04 ENCOUNTER — Inpatient Hospital Stay: Payer: Medicare HMO

## 2019-06-04 ENCOUNTER — Other Ambulatory Visit: Payer: Self-pay | Admitting: *Deleted

## 2019-06-04 VITALS — BP 162/57 | HR 62 | Temp 97.1°F | Resp 19

## 2019-06-04 DIAGNOSIS — Z7952 Long term (current) use of systemic steroids: Secondary | ICD-10-CM | POA: Diagnosis not present

## 2019-06-04 DIAGNOSIS — Z5112 Encounter for antineoplastic immunotherapy: Secondary | ICD-10-CM | POA: Diagnosis not present

## 2019-06-04 DIAGNOSIS — Z5111 Encounter for antineoplastic chemotherapy: Secondary | ICD-10-CM | POA: Diagnosis not present

## 2019-06-04 DIAGNOSIS — Z7982 Long term (current) use of aspirin: Secondary | ICD-10-CM | POA: Diagnosis not present

## 2019-06-04 DIAGNOSIS — Z79899 Other long term (current) drug therapy: Secondary | ICD-10-CM | POA: Diagnosis not present

## 2019-06-04 DIAGNOSIS — Z794 Long term (current) use of insulin: Secondary | ICD-10-CM | POA: Diagnosis not present

## 2019-06-04 DIAGNOSIS — C189 Malignant neoplasm of colon, unspecified: Secondary | ICD-10-CM

## 2019-06-04 DIAGNOSIS — R42 Dizziness and giddiness: Secondary | ICD-10-CM

## 2019-06-04 DIAGNOSIS — C182 Malignant neoplasm of ascending colon: Secondary | ICD-10-CM | POA: Diagnosis not present

## 2019-06-04 DIAGNOSIS — D509 Iron deficiency anemia, unspecified: Secondary | ICD-10-CM | POA: Diagnosis not present

## 2019-06-04 MED ORDER — SODIUM CHLORIDE 0.9% FLUSH
10.0000 mL | INTRAVENOUS | Status: DC | PRN
  Administered 2019-06-04: 10 mL
  Filled 2019-06-04: qty 10

## 2019-06-04 MED ORDER — HEPARIN SOD (PORK) LOCK FLUSH 100 UNIT/ML IV SOLN
500.0000 [IU] | Freq: Once | INTRAVENOUS | Status: AC | PRN
  Administered 2019-06-04: 500 [IU]
  Filled 2019-06-04: qty 5

## 2019-06-04 MED ORDER — MECLIZINE HCL 12.5 MG PO TABS: ORAL_TABLET | ORAL | 0 refills | Status: DC

## 2019-06-11 ENCOUNTER — Other Ambulatory Visit: Payer: Self-pay

## 2019-06-11 DIAGNOSIS — I493 Ventricular premature depolarization: Secondary | ICD-10-CM

## 2019-06-11 DIAGNOSIS — I10 Essential (primary) hypertension: Secondary | ICD-10-CM

## 2019-06-11 MED ORDER — AMLODIPINE BESYLATE 10 MG PO TABS: 10.0000 mg | ORAL_TABLET | Freq: Every day | ORAL | 0 refills | Status: DC

## 2019-06-11 MED ORDER — METOPROLOL TARTRATE 25 MG PO TABS: 25.0000 mg | ORAL_TABLET | Freq: Two times a day (BID) | ORAL | 0 refills | Status: DC

## 2019-06-12 ENCOUNTER — Other Ambulatory Visit: Payer: Self-pay | Admitting: *Deleted

## 2019-06-12 ENCOUNTER — Telehealth: Payer: Self-pay | Admitting: *Deleted

## 2019-06-12 MED ORDER — TRAMADOL HCL 50 MG PO TABS: 50.0000 mg | ORAL_TABLET | Freq: Four times a day (QID) | ORAL | 0 refills | Status: DC | PRN

## 2019-06-12 NOTE — Telephone Encounter (Signed)
Message received from patient's daughter, Tonya [name] requesting a call back.  Call placed back to [place] and she states that her mother would like to know why she is on a steroid and to inform Dr. Marin Olp that pt has a mouth sore and slight nose bleed.  Dr. Marin Olp notified.  Instructed pt.'s daughter that Decadron is to be taken for nausea, to use MM for mouth sore and saline spray for slight nose bleed.  [name] states that nose bleed is when pt blows her nose and is a slight amount only.  [name] appreciative of assistance and has no further questions or concerns at this time.

## 2019-06-12 NOTE — Telephone Encounter (Signed)
Opened in error

## 2019-06-13 ENCOUNTER — Other Ambulatory Visit: Payer: Self-pay | Admitting: *Deleted

## 2019-06-13 DIAGNOSIS — R42 Dizziness and giddiness: Secondary | ICD-10-CM

## 2019-06-13 MED ORDER — MECLIZINE HCL 12.5 MG PO TABS: ORAL_TABLET | ORAL | 2 refills | Status: DC

## 2019-06-16 ENCOUNTER — Other Ambulatory Visit: Payer: Self-pay | Admitting: Family Medicine

## 2019-06-16 ENCOUNTER — Telehealth: Payer: Self-pay | Admitting: Endocrinology

## 2019-06-16 DIAGNOSIS — I493 Ventricular premature depolarization: Secondary | ICD-10-CM

## 2019-06-16 DIAGNOSIS — I10 Essential (primary) hypertension: Secondary | ICD-10-CM

## 2019-06-16 NOTE — Telephone Encounter (Signed)
OK 

## 2019-06-16 NOTE — Telephone Encounter (Signed)
Patient's daughter called asking if we had one insulin shot sample for patient since she is almost out. Ph# [phone]

## 2019-06-16 NOTE — Telephone Encounter (Signed)
Please advise if you are willing to give pt samples

## 2019-06-17 ENCOUNTER — Encounter (HOSPITAL_COMMUNITY): Payer: Medicare HMO

## 2019-06-17 NOTE — Telephone Encounter (Signed)
SAMPLE  Dr. Loanne Drilling approved 1 sample of Tresiba U100 pen to be given to pt to [facility] therapy until refill received. Called pt and informed about the approval and that the pen is available for pick up at her earliest convenience. Placed LABELED pen in fridge by front nurse's station for pt pick up. Verbalized appreciation, acceptance and understanding.

## 2019-06-19 ENCOUNTER — Ambulatory Visit (HOSPITAL_COMMUNITY)
Admission: RE | Admit: 2019-06-19 | Discharge: 2019-06-19 | Disposition: A | Discharge status: Home / Self Care | Payer: Medicare HMO | Source: Ambulatory Visit | Attending: Hematology & Oncology | Admitting: Hematology & Oncology

## 2019-06-19 ENCOUNTER — Other Ambulatory Visit: Payer: Self-pay

## 2019-06-19 DIAGNOSIS — Z79899 Other long term (current) drug therapy: Secondary | ICD-10-CM | POA: Insufficient documentation

## 2019-06-19 DIAGNOSIS — R590 Localized enlarged lymph nodes: Secondary | ICD-10-CM | POA: Insufficient documentation

## 2019-06-19 DIAGNOSIS — D734 Cyst of spleen: Secondary | ICD-10-CM | POA: Insufficient documentation

## 2019-06-19 DIAGNOSIS — C189 Malignant neoplasm of colon, unspecified: Secondary | ICD-10-CM | POA: Diagnosis not present

## 2019-06-19 DIAGNOSIS — C787 Secondary malignant neoplasm of liver and intrahepatic bile duct: Secondary | ICD-10-CM | POA: Diagnosis present

## 2019-06-19 DIAGNOSIS — I700 Atherosclerosis of aorta: Secondary | ICD-10-CM | POA: Diagnosis not present

## 2019-06-19 DIAGNOSIS — Z5111 Encounter for antineoplastic chemotherapy: Secondary | ICD-10-CM | POA: Diagnosis not present

## 2019-06-19 DIAGNOSIS — R911 Solitary pulmonary nodule: Secondary | ICD-10-CM | POA: Diagnosis not present

## 2019-06-19 DIAGNOSIS — I2510 Atherosclerotic heart disease of native coronary artery without angina pectoris: Secondary | ICD-10-CM | POA: Insufficient documentation

## 2019-06-19 LAB — GLUCOSE, CAPILLARY: Glucose-Capillary: 127 mg/dL — ABNORMAL HIGH (ref 70–99)

## 2019-06-19 MED ORDER — FLUDEOXYGLUCOSE F - 18 (FDG) INJECTION: 8.9000 | Freq: Once | INTRAVENOUS | Status: DC | PRN

## 2019-06-20 ENCOUNTER — Telehealth: Payer: Self-pay | Admitting: *Deleted

## 2019-06-20 NOTE — Telephone Encounter (Signed)
error 

## 2019-06-20 NOTE — Telephone Encounter (Signed)
Returned call to pt, spoke w/debbie who advised pt was short of breath, rapid HR on the way home from PET Scan. Concerns regarding if pt is allergic to the dye that was injected. Pt symptoms resolved shortly after arriving home. Denies any symptoms at this time. Message forward to MD for review.

## 2019-06-23 ENCOUNTER — Inpatient Hospital Stay: Payer: Medicare HMO | Admitting: Family

## 2019-06-23 ENCOUNTER — Telehealth: Payer: Self-pay | Admitting: *Deleted

## 2019-06-23 ENCOUNTER — Inpatient Hospital Stay: Payer: Medicare HMO

## 2019-06-23 NOTE — Telephone Encounter (Signed)
Call received from patient's daughter, [name] requesting PET scan results and to let office know that pt.'s new insurance went in to effect yesterday.  Notified [name] per order of Dr. Marin Olp that  "the cancer is still responding!! Nothing is growing!! The tumors that you have are still decreasing in activity!!"  Otilio Carpen notified that pt.'s insurance is now in effect.  Requested [name] to call or fax new insurance information to office.  [name] states that she will contact her sister to have new insurance information sent to office and is appreciative of assistance.

## 2019-06-25 ENCOUNTER — Inpatient Hospital Stay: Payer: Medicare HMO

## 2019-06-25 ENCOUNTER — Telehealth: Payer: Self-pay

## 2019-06-25 NOTE — Telephone Encounter (Signed)
Received VM from pt's daughter [name] with updated insurance information. Per [name], pt's new insurance is [employer].  Group # [id number] Member ID [id number]  This information forwarded to Baxter Flattery, Development worker, community. dph

## 2019-06-30 ENCOUNTER — Other Ambulatory Visit: Payer: Self-pay

## 2019-06-30 ENCOUNTER — Telehealth: Payer: Self-pay | Admitting: Endocrinology

## 2019-06-30 DIAGNOSIS — E1149 Type 2 diabetes mellitus with other diabetic neurological complication: Secondary | ICD-10-CM

## 2019-06-30 MED ORDER — PEN NEEDLES 32G X 4 MM MISC: 1.0000 | Freq: Every day | 0 refills | Status: DC

## 2019-06-30 MED ORDER — TRESIBA FLEXTOUCH 100 UNIT/ML ~~LOC~~ SOPN: 20.0000 [IU] | PEN_INJECTOR | Freq: Every day | SUBCUTANEOUS | 11 refills | Status: DC

## 2019-06-30 NOTE — Telephone Encounter (Signed)
Outpatient Medication Detail   Disp Refills Start End   insulin degludec (TRESIBA FLEXTOUCH) 100 UNIT/ML SOPN FlexTouch Pen 5 pen 11 05/15/2019    Sig - Route: Inject 0.2 mLs (20 Units total) into the skin daily. And 4 mm pen needles 1/day. - Subcutaneous   Sent to pharmacy as: insulin degludec (TRESIBA FLEXTOUCH) 100 UNIT/ML Solution Pen-injector FlexTouch Pen   E-Prescribing Status: Receipt confirmed by pharmacy (05/15/2019 11:09 AM EST)    Pharmacy  [facility], [address] [place], [place] [address]  Phone:  [phone]  Fax:  [phone]    As indicted above, pt has 11 refills with [facility] (requested pharmacy).

## 2019-06-30 NOTE — Telephone Encounter (Signed)
In addition to [facility] request that has been documented below, Rx has been sent to [facility] as well:  Outpatient Medication Detail   Disp Refills Start End   insulin degludec (TRESIBA FLEXTOUCH) 100 UNIT/ML FlexTouch Pen 5 pen 11 06/30/2019    Sig - Route: Inject 0.2 mLs (20 Units total) into the skin daily. - Subcutaneous   Sent to pharmacy as: insulin degludec (TRESIBA FLEXTOUCH) 100 UNIT/ML FlexTouch Pen   E-Prescribing Status: Receipt confirmed by pharmacy (06/30/2019 10:21 AM EST)    Pharmacy  [facility], [address]  [place], [place] [address]  Phone:  [phone]  Fax:  [phone]    Outpatient Medication Detail   Disp Refills Start End   Insulin Pen Needle (PEN NEEDLES) 32G X 4 MM MISC 100 each 0 06/30/2019    Sig - Route: 1 each by Does not apply route daily. E11.9 - Does not apply   Sent to pharmacy as: Insulin Pen Needle (PEN NEEDLES) 32G X 4 MM Misc   E-Prescribing Status: Receipt confirmed by pharmacy (06/30/2019 10:21 AM EST)    Pharmacy  [facility], [address]  [place], [place] [address]  Phone:  [phone]  Fax:  [phone]

## 2019-06-30 NOTE — Telephone Encounter (Signed)
[id number] - [place], [address] Phone:  [phone]  Fax:  [phone]     Patient needs ONE tresiba sent to the [facility] in [place].  Moving forward, all this patient's future medications need to be sent through [facility] mail order service.   Patient ph# [phone]

## 2019-06-30 NOTE — Telephone Encounter (Signed)
error 

## 2019-07-01 ENCOUNTER — Other Ambulatory Visit: Payer: Self-pay

## 2019-07-01 ENCOUNTER — Telehealth: Payer: Self-pay | Admitting: Endocrinology

## 2019-07-01 DIAGNOSIS — E1149 Type 2 diabetes mellitus with other diabetic neurological complication: Secondary | ICD-10-CM

## 2019-07-01 MED ORDER — PEN NEEDLES 32G X 4 MM MISC: 1.0000 | Freq: Every day | 0 refills | Status: DC

## 2019-07-01 MED ORDER — TRESIBA FLEXTOUCH 100 UNIT/ML ~~LOC~~ SOPN: 20.0000 [IU] | PEN_INJECTOR | Freq: Every day | SUBCUTANEOUS | 11 refills | Status: DC

## 2019-07-01 NOTE — Telephone Encounter (Signed)
Outpatient Medication Detail   Disp Refills Start End   insulin degludec (TRESIBA FLEXTOUCH) 100 UNIT/ML FlexTouch Pen 5 pen 11 07/01/2019    Sig - Route: Inject 0.2 mLs (20 Units total) into the skin daily. - Subcutaneous   Sent to pharmacy as: insulin degludec (TRESIBA FLEXTOUCH) 100 UNIT/ML FlexTouch Pen   E-Prescribing Status: Receipt confirmed by pharmacy (07/01/2019  4:31 PM EST)    Outpatient Medication Detail   Disp Refills Start End   Insulin Pen Needle (PEN NEEDLES) 32G X 4 MM MISC 100 each 0 07/01/2019    Sig - Route: 1 each by Does not apply route daily. E11.9 - Does not apply   Sent to pharmacy as: Insulin Pen Needle (PEN NEEDLES) 32G X 4 MM Misc   E-Prescribing Status: Receipt confirmed by pharmacy (07/01/2019  4:31 PM EST)    Pharmacy  [facility] [id number] - [place], [address], [place] [address]  Phone:  [phone]  Fax:  [phone]

## 2019-07-01 NOTE — Telephone Encounter (Signed)
Please send just 1 Box of 5 Tresiba to [facility]  so that patient will have medication until mail order RX received. Patient's family has spoken with [facility] and [facility] is adamant that they do not have anything to fill and patient is currently without insulin.   Any questions pleas contact patient

## 2019-07-02 ENCOUNTER — Telehealth: Payer: Self-pay

## 2019-07-02 NOTE — Telephone Encounter (Signed)
Received call from pt's daughter, [name] with questions related to insurance and tingling in left leg.   Per Baxter Flattery, Development worker, community, insurance approval still pending at this time. Hopes to be able to reschedule pt for MD and chemo by next week.   Per Deb, pt has tingling in left lower leg. No swelling, redness, temp change. Able to move without restraint. Per Dr Marin Olp, pt to try taking OTC vitamin B12 daily.   Ok per Dr Marin Olp for pt to receive Covid vaccine.   [name] verbalizes understanding. dph

## 2019-07-03 ENCOUNTER — Telehealth: Payer: Self-pay | Admitting: Hematology & Oncology

## 2019-07-03 NOTE — Telephone Encounter (Signed)
Called and LMVM for daughter to advise that wre had received approval for treatment from [employer] provider.  Appts were scheduled for 3/16.  Per 3/11 secure chat from [place]

## 2019-07-04 ENCOUNTER — Telehealth: Payer: Self-pay | Admitting: Endocrinology

## 2019-07-04 NOTE — Telephone Encounter (Signed)
Patients daughter called stating that for all future refills to go through  [place], [place] Phone:  [phone]  Fax:  [phone]     And patient requests her insulin refills to be 90 day refills for the future. Patient ph# [phone]

## 2019-07-06 ENCOUNTER — Other Ambulatory Visit: Payer: Self-pay | Admitting: Family Medicine

## 2019-07-06 DIAGNOSIS — I10 Essential (primary) hypertension: Secondary | ICD-10-CM

## 2019-07-06 DIAGNOSIS — I493 Ventricular premature depolarization: Secondary | ICD-10-CM

## 2019-07-07 ENCOUNTER — Telehealth: Payer: Self-pay | Admitting: Family Medicine

## 2019-07-07 NOTE — Telephone Encounter (Signed)
Patient is calling and switched her insurance company to [facility] and she is needing a 90 day supply of the following medication. The patient never picked this up in February as she could not afford it  [place], [place] Phone:  [phone]  Fax:  [phone]

## 2019-07-07 NOTE — Telephone Encounter (Signed)
Patient  daughter and patient did not remember which med needed to be refilled so they will; call us back when they figure it out

## 2019-07-08 ENCOUNTER — Telehealth: Payer: Self-pay

## 2019-07-08 ENCOUNTER — Inpatient Hospital Stay: Payer: Medicare HMO

## 2019-07-08 ENCOUNTER — Other Ambulatory Visit: Payer: Self-pay

## 2019-07-08 ENCOUNTER — Other Ambulatory Visit: Payer: Self-pay | Admitting: *Deleted

## 2019-07-08 ENCOUNTER — Inpatient Hospital Stay: Payer: Medicare HMO | Attending: Hematology & Oncology

## 2019-07-08 ENCOUNTER — Encounter: Payer: Self-pay | Admitting: Hematology & Oncology

## 2019-07-08 ENCOUNTER — Telehealth: Payer: Self-pay | Admitting: Hematology & Oncology

## 2019-07-08 ENCOUNTER — Inpatient Hospital Stay (HOSPITAL_BASED_OUTPATIENT_CLINIC_OR_DEPARTMENT_OTHER): Payer: Medicare HMO | Admitting: Hematology & Oncology

## 2019-07-08 VITALS — BP 140/77 | HR 75 | Temp 96.2°F | Resp 18 | Wt 179.0 lb

## 2019-07-08 DIAGNOSIS — C19 Malignant neoplasm of rectosigmoid junction: Secondary | ICD-10-CM

## 2019-07-08 DIAGNOSIS — C189 Malignant neoplasm of colon, unspecified: Secondary | ICD-10-CM | POA: Diagnosis not present

## 2019-07-08 DIAGNOSIS — C787 Secondary malignant neoplasm of liver and intrahepatic bile duct: Secondary | ICD-10-CM

## 2019-07-08 DIAGNOSIS — Z5111 Encounter for antineoplastic chemotherapy: Secondary | ICD-10-CM | POA: Insufficient documentation

## 2019-07-08 DIAGNOSIS — D500 Iron deficiency anemia secondary to blood loss (chronic): Secondary | ICD-10-CM

## 2019-07-08 DIAGNOSIS — Z79899 Other long term (current) drug therapy: Secondary | ICD-10-CM | POA: Diagnosis not present

## 2019-07-08 DIAGNOSIS — Z7952 Long term (current) use of systemic steroids: Secondary | ICD-10-CM | POA: Insufficient documentation

## 2019-07-08 DIAGNOSIS — C182 Malignant neoplasm of ascending colon: Secondary | ICD-10-CM | POA: Diagnosis not present

## 2019-07-08 DIAGNOSIS — Z7982 Long term (current) use of aspirin: Secondary | ICD-10-CM | POA: Insufficient documentation

## 2019-07-08 DIAGNOSIS — E1149 Type 2 diabetes mellitus with other diabetic neurological complication: Secondary | ICD-10-CM

## 2019-07-08 DIAGNOSIS — D509 Iron deficiency anemia, unspecified: Secondary | ICD-10-CM | POA: Insufficient documentation

## 2019-07-08 DIAGNOSIS — Z5112 Encounter for antineoplastic immunotherapy: Secondary | ICD-10-CM | POA: Insufficient documentation

## 2019-07-08 LAB — CMP (CANCER CENTER ONLY)
ALT: 16 U/L (ref 0–44)
AST: 20 U/L (ref 15–41)
Albumin: 3.9 g/dL (ref 3.5–5.0)
Alkaline Phosphatase: 66 U/L (ref 38–126)
Anion gap: 8 (ref 5–15)
BUN: 22 mg/dL (ref 8–23)
CO2: 29 mmol/L (ref 22–32)
Calcium: 9.6 mg/dL (ref 8.9–10.3)
Chloride: 107 mmol/L (ref 98–111)
Creatinine: 0.77 mg/dL (ref 0.44–1.00)
GFR, Est AFR Am: >60 mL/min (ref >60)
GFR, Estimated: >60 mL/min (ref >60)
Glucose, Bld: 127 mg/dL — ABNORMAL HIGH (ref 70–99)
Potassium: 3.9 mmol/L (ref 3.5–5.1)
Sodium: 144 mmol/L (ref 135–145)
Total Bilirubin: 0.6 mg/dL (ref 0.3–1.2)
Total Protein: 6 g/dL — ABNORMAL LOW (ref 6.5–8.1)

## 2019-07-08 LAB — CBC WITH DIFFERENTIAL (CANCER CENTER ONLY)
Abs Immature Granulocytes: 0.03 10*3/uL (ref 0.00–0.07)
Basophils Absolute: 0 10*3/uL (ref 0.0–0.1)
Basophils Relative: 0 %
Eosinophils Absolute: 0.1 10*3/uL (ref 0.0–0.5)
Eosinophils Relative: 1 %
HCT: 38.4 % (ref 36.0–46.0)
Hemoglobin: 11.9 g/dL — ABNORMAL LOW (ref 12.0–15.0)
Immature Granulocytes: 1 %
Lymphocytes Relative: 25 %
Lymphs Abs: 1.5 10*3/uL (ref 0.7–4.0)
MCH: 27.1 pg (ref 26.0–34.0)
MCHC: 31 g/dL (ref 30.0–36.0)
MCV: 87.5 fL (ref 80.0–100.0)
Monocytes Absolute: 0.6 10*3/uL (ref 0.1–1.0)
Monocytes Relative: 11 %
Neutro Abs: 3.6 10*3/uL (ref 1.7–7.7)
Neutrophils Relative %: 62 %
Platelet Count: 227 10*3/uL (ref 150–400)
RBC: 4.39 MIL/uL (ref 3.87–5.11)
RDW: 14 % (ref 11.5–15.5)
WBC Count: 5.9 10*3/uL (ref 4.0–10.5)
nRBC: 0 % (ref 0.0–0.2)

## 2019-07-08 LAB — IRON AND TIBC
Iron: 63 ug/dL (ref 41–142)
Saturation Ratios: 23 % (ref 21–57)
TIBC: 271 ug/dL (ref 236–444)
UIBC: 209 ug/dL (ref 120–384)

## 2019-07-08 LAB — FERRITIN: Ferritin: 312 ng/mL — ABNORMAL HIGH (ref 11–307)

## 2019-07-08 LAB — CEA (IN HOUSE-CHCC): CEA (CHCC-In House): 1.32 ng/mL (ref 0.00–5.00)

## 2019-07-08 LAB — TOTAL PROTEIN, URINE DIPSTICK: Protein, ur: NEGATIVE mg/dL

## 2019-07-08 MED ORDER — DEXAMETHASONE 4 MG PO TABS: 8.0000 mg | ORAL_TABLET | Freq: Two times a day (BID) | ORAL | 2 refills | Status: DC

## 2019-07-08 MED ORDER — SODIUM CHLORIDE 0.9 % IV SOLN
Freq: Once | INTRAVENOUS | Status: AC
  Filled 2019-07-08: qty 250

## 2019-07-08 MED ORDER — FLUOROURACIL CHEMO INJECTION 2.5 GM/50ML
320.0000 mg/m2 | Freq: Once | INTRAVENOUS | Status: AC
  Administered 2019-07-08: 14:00:00 650 mg via INTRAVENOUS
  Filled 2019-07-08: qty 13

## 2019-07-08 MED ORDER — SODIUM CHLORIDE 0.9 % IV SOLN
135.0000 mg/m2 | Freq: Once | INTRAVENOUS | Status: AC
  Administered 2019-07-08: 12:00:00 260 mg via INTRAVENOUS
  Filled 2019-07-08: qty 5

## 2019-07-08 MED ORDER — DEXAMETHASONE SODIUM PHOSPHATE 10 MG/ML IJ SOLN
INTRAMUSCULAR | Status: AC
  Filled 2019-07-08: qty 1

## 2019-07-08 MED ORDER — TRUE METRIX BLOOD GLUCOSE TEST VI STRP: 1.0000 | ORAL_STRIP | Freq: Two times a day (BID) | 0 refills | Status: DC

## 2019-07-08 MED ORDER — PALONOSETRON HCL INJECTION 0.25 MG/5ML
INTRAVENOUS | Status: AC
  Filled 2019-07-08: qty 5

## 2019-07-08 MED ORDER — TRUE METRIX AIR GLUCOSE METER DEVI: 1.0000 | Freq: Two times a day (BID) | 0 refills | Status: DC

## 2019-07-08 MED ORDER — SODIUM CHLORIDE 0.9 % IV SOLN
400.0000 mg/m2 | Freq: Once | INTRAVENOUS | Status: AC
  Administered 2019-07-08: 12:00:00 784 mg via INTRAVENOUS
  Filled 2019-07-08: qty 39.2

## 2019-07-08 MED ORDER — BD SWAB SINGLE USE REGULAR PADS: 1.0000 | MEDICATED_PAD | 2 refills | Status: AC | PRN

## 2019-07-08 MED ORDER — PEN NEEDLES 32G X 4 MM MISC: 1.0000 | Freq: Every day | 0 refills | Status: DC

## 2019-07-08 MED ORDER — PALONOSETRON HCL INJECTION 0.25 MG/5ML
0.2500 mg | Freq: Once | INTRAVENOUS | Status: AC
  Administered 2019-07-08: 10:00:00 0.25 mg via INTRAVENOUS

## 2019-07-08 MED ORDER — TRUE METRIX LEVEL 1 LOW VI SOLN: 1.0000 | 0 refills | Status: DC | PRN

## 2019-07-08 MED ORDER — SODIUM CHLORIDE 0.9 % IV SOLN
5.0000 mg/kg | Freq: Once | INTRAVENOUS | Status: AC
  Administered 2019-07-08: 11:00:00 400 mg via INTRAVENOUS
  Filled 2019-07-08: qty 16

## 2019-07-08 MED ORDER — SODIUM CHLORIDE 0.9 % IV SOLN
1920.0000 mg/m2 | INTRAVENOUS | Status: DC
  Administered 2019-07-08: 14:00:00 3750 mg via INTRAVENOUS
  Filled 2019-07-08: qty 75

## 2019-07-08 MED ORDER — TRUEPLUS LANCETS 30G MISC: 1.0000 | Freq: Two times a day (BID) | 0 refills | Status: AC

## 2019-07-08 MED ORDER — DEXAMETHASONE SODIUM PHOSPHATE 10 MG/ML IJ SOLN
10.0000 mg | Freq: Once | INTRAMUSCULAR | Status: AC
  Administered 2019-07-08: 10:00:00 10 mg via INTRAVENOUS

## 2019-07-08 MED ORDER — IRINOTECAN HCL CHEMO INJECTION 100 MG/5ML
135.0000 mg/m2 | Freq: Once | INTRAVENOUS | Status: DC
  Filled 2019-07-08: qty 13

## 2019-07-08 NOTE — Telephone Encounter (Signed)
Appointments scheduled calendar printed per 3/16 los

## 2019-07-08 NOTE — Telephone Encounter (Signed)
Received fax from [place] re: Rx for diabetic shoes. Dr. Loanne Drilling reviewed and advised pt does not qualify. Therefore, forms and Rx was not completed nor sent.

## 2019-07-08 NOTE — Progress Notes (Signed)
Hematology and Oncology Follow Up Visit  Tonya [name] [id number] [birth year] 79 y.o. 07/08/2019   Principle Diagnosis:  Stage IV adenocarcinoma of the ascending colon -- NRAS+, BRAF-, HER2-, MSI/MMR -,  Iron deficiency anemia  Past Therapy: FOLFOX - s/p cycle 4  Current Therapy:   FOLFIRI/Bevacizumab -started 01/20/2019, s/p cycle #8 IV Iron as indicated   Interim History:  Tonya [name] is here today with her daughter for follow-up.   For some reason, it has been about 5 weeks since she has been here.  I think there is some insurance issues.  Apparently she must have changed insurance.  Thankfully, her last PET scan showed that she is still responding.  Her extracolonic disease is all improving.  She still has the activity in the colon which is about the same.  She has had no problems with nausea or vomiting.  There is been no bleeding.  She has had no problems with fever.  She has had no cough or shortness of breath.  Her last CEA level was holding steady at 3.1.  She has had no issues with respect to her past CVA.  Her blood pressure has been under pretty good control.  Overall, her performance status is ECOG 1.     Medications:  Allergies as of 07/08/2019      Reactions   Hydrocodone Swelling, Other (See Comments)   "I started swelling, became red, and passed out"   Iohexol Hives, Shortness Of Breath, Other (See Comments)   Patient developed hives and fullness in throat post injection of 125cc's Omni 300, Onset Date: 11/15/2006   Naproxen Other (See Comments)   "It made me feel out of my head"   Ibuprofen Nausea Only   Propoxyphene N-acetaminophen Other (See Comments)   "I couldn't find the door to make my way out of the room- I was in misery"      Medication List       Accurate as of July 08, 2019  9:35 AM. If you have any questions, ask your nurse or doctor.        STOP taking these medications   baclofen 10 MG tablet Commonly known as: LIORESAL Stopped by:  [name], MD   cyclobenzaprine 7.5 MG tablet Commonly known as: FEXMID Stopped by: [name], MD     TAKE these medications   amLODipine 10 MG tablet Commonly known as: NORVASC Take 1 tablet (10 mg total) by mouth daily.   aspirin EC 81 MG tablet Take 81 mg by mouth daily.   atorvastatin 10 MG tablet Commonly known as: LIPITOR Take 1 tablet (10 mg total) by mouth daily.   Cholecalciferol 50 MCG (2000 UT) Tabs Take 1 tablet (2,000 Units total) by mouth daily.   dexamethasone 4 MG tablet Commonly known as: DECADRON Take 2 tablets (8 mg total) by mouth 2 (two) times daily. For four days following chemotherapy.   dronabinol 2.5 MG capsule Commonly known as: MARINOL Take 1 capsule (2.5 mg total) by mouth 2 (two) times daily before a meal.   lidocaine 2 % solution Commonly known as: XYLOCAINE   lidocaine-prilocaine cream Commonly known as: EMLA   loperamide 2 MG tablet Commonly known as: Imodium A-D Take 2 tablets (4 mg total) by mouth 4 (four) times daily as needed. Take 2 at diarrhea onset , then 1 every 2hr until 12hrs with no BM. May take 2 every 4hrs at night. If diarrhea recurs repeat.   losartan 50 MG tablet Commonly  known as: COZAAR TAKE 1 TABLET BY MOUTH IN THE MORNING   magic mouthwash Soln Swish and swallow equal parts Benadryl, Lidocaine and Nystatin.  5- 10 ml.'s four times a day as needed.   meclizine 12.5 MG tablet Commonly known as: ANTIVERT TAKE 1 TABLET(12.5 MG) BY MOUTH THREE TIMES DAILY AS NEEDED FOR DIZZINESS   metoprolol tartrate 25 MG tablet Commonly known as: LOPRESSOR TAKE 1 TABLET BY MOUTH TWICE A DAY   olmesartan 20 MG tablet Commonly known as: BENICAR Take 20 mg by mouth daily.   Pen Needles 32G X 4 MM Misc 1 each by Does not apply route daily. E11.9   temazepam 7.5 MG capsule Commonly known as: Restoril Take 1 capsule (7.5 mg total) by mouth at bedtime as needed for sleep.   traMADol 50 MG tablet Commonly known as:  ULTRAM Take 1 tablet (50 mg total) by mouth every 6 (six) hours as needed.   Tyler Aas FlexTouch 100 UNIT/ML FlexTouch Pen Generic drug: insulin degludec Inject 0.2 mLs (20 Units total) into the skin daily.   True Metrix Air Glucose Meter Devi 1 each by Does not apply route 2 (two) times daily. E11.9 Started by: Aleatha Borer, LPN   True Metrix Blood Glucose Test test strip Generic drug: glucose blood 1 each by Other route 2 (two) times daily. E11.9 Started by: Aleatha Borer, LPN   TRUEplus Lancets 30G Misc 1 each by Does not apply route 2 (two) times daily. E11.9 What changed:   how much to take  how to take this  when to take this  additional instructions Changed by: Aleatha Borer, LPN       Allergies:  Allergies  Allergen Reactions  . Hydrocodone Swelling and Other (See Comments)    "I started swelling, became red, and passed out"  . Iohexol Hives, Shortness Of Breath and Other (See Comments)    Patient developed hives and fullness in throat post injection of 125cc's Omni 300, Onset Date: 11/15/2006   . Naproxen Other (See Comments)    "It made me feel out of my head"  . Ibuprofen Nausea Only  . Propoxyphene N-Acetaminophen Other (See Comments)    "I couldn't find the door to make my way out of the room- I was in misery"    Past Medical History, Surgical history, Social history, and Family History were reviewed and updated.  Review of Systems: Review of Systems  Constitutional: Negative.   HENT: Negative.   Eyes: Negative.   Respiratory: Negative.   Cardiovascular: Negative.   Gastrointestinal: Positive for abdominal pain.  Genitourinary: Negative.   Musculoskeletal: Positive for back pain.  Skin: Negative.   Neurological: Negative.   Endo/Heme/Allergies: Negative.   Psychiatric/Behavioral: The patient has insomnia.       Physical Exam:  weight is 179 lb (81.2 kg). Her temporal temperature is 96.2 F (35.7 C) (abnormal). Her blood pressure is  140/77 and her pulse is 75. Her respiration is 18 and oxygen saturation is 100%.   Wt Readings from Last 3 Encounters:  07/08/19 179 lb (81.2 kg)  06/02/19 181 lb (82.1 kg)  05/29/19 186 lb 9.6 oz (84.6 kg)    Physical Exam Vitals reviewed.  HENT:     Head: Normocephalic and atraumatic.  Eyes:     Pupils: Pupils are equal, round, and reactive to light.  Cardiovascular:     Rate and Rhythm: Normal rate and regular rhythm.     Heart sounds: Normal heart sounds.  Pulmonary:  Effort: Pulmonary effort is normal.     Breath sounds: Normal breath sounds.  Abdominal:     General: Bowel sounds are normal.     Palpations: Abdomen is soft.     Comments: Abdominal exam is soft.  She has good bowel sounds.  There is no fluid wave.  There is no palpable liver or spleen tip.  Musculoskeletal:        General: No tenderness or deformity. Normal range of motion.     Cervical back: Normal range of motion.  Lymphadenopathy:     Cervical: No cervical adenopathy.  Skin:    General: Skin is warm and dry.     Findings: No erythema or rash.  Neurological:     Mental Status: She is alert and oriented to person, place, and time.     Comments: She has chronic weakness on her left side.  This is from a past CVA.  Psychiatric:        Behavior: Behavior normal.        Thought Content: Thought content normal.        Judgment: Judgment normal.      Lab Results  Component Value Date   WBC 5.9 07/08/2019   HGB 11.9 (L) 07/08/2019   HCT 38.4 07/08/2019   MCV 87.5 07/08/2019   PLT 227 07/08/2019   Lab Results  Component Value Date   FERRITIN 477 (H) 05/12/2019   IRON 46 05/12/2019   TIBC 213 (L) 05/12/2019   UIBC 167 05/12/2019   IRONPCTSAT 22 05/12/2019   Lab Results  Component Value Date   RETICCTPCT 1.5 05/17/2016   RBC 4.39 07/08/2019   RETICCTABS 62,400 05/17/2016   No results found for: KPAFRELGTCHN, LAMBDASER, KAPLAMBRATIO No results found for: IGGSERUM, IGA, IGMSERUM No  results found for: Odetta Pink, SPEI   Chemistry      Component Value Date/Time   NA 143 06/02/2019 0930   NA 139 09/30/2018 1311   K 3.8 06/02/2019 0930   CL 107 06/02/2019 0930   CO2 29 06/02/2019 0930   BUN 16 06/02/2019 0930   BUN 10 09/30/2018 1311   CREATININE 0.75 06/02/2019 0930      Component Value Date/Time   CALCIUM 9.5 06/02/2019 0930   ALKPHOS 75 06/02/2019 0930   AST 14 (L) 06/02/2019 0930   ALT 12 06/02/2019 0930   BILITOT 0.6 06/02/2019 0930       Impression and Plan: Ms. Blue is a very pleasant 79 yo African American female with metastatic colon cancer.   As long as we are seeing a response, and as long as she is responding to treatment, we will continue her on the FOLFIRI/Avastin.  I think this is a good protocol for her.  She seems to be tolerating this quite well.  As always, we had an excellent prayer at the end of our meeting.  Now that we are approaching Resurrection Sunday, this is a very important day for [place] and we all wanted to celebrate.  We will have her come back in 3 weeks.  The 3-week cycle intervals have worked quite well for her.    [name], MD 3/16/20219:35 AM

## 2019-07-08 NOTE — Patient Instructions (Signed)

## 2019-07-10 ENCOUNTER — Other Ambulatory Visit: Payer: Self-pay

## 2019-07-10 ENCOUNTER — Inpatient Hospital Stay: Payer: Medicare HMO

## 2019-07-10 VITALS — BP 158/84 | HR 82 | Temp 97.3°F | Resp 18

## 2019-07-10 DIAGNOSIS — Z7952 Long term (current) use of systemic steroids: Secondary | ICD-10-CM | POA: Diagnosis not present

## 2019-07-10 DIAGNOSIS — C182 Malignant neoplasm of ascending colon: Secondary | ICD-10-CM | POA: Diagnosis not present

## 2019-07-10 DIAGNOSIS — Z5112 Encounter for antineoplastic immunotherapy: Secondary | ICD-10-CM | POA: Diagnosis not present

## 2019-07-10 DIAGNOSIS — D509 Iron deficiency anemia, unspecified: Secondary | ICD-10-CM | POA: Diagnosis not present

## 2019-07-10 DIAGNOSIS — C189 Malignant neoplasm of colon, unspecified: Secondary | ICD-10-CM

## 2019-07-10 DIAGNOSIS — Z5111 Encounter for antineoplastic chemotherapy: Secondary | ICD-10-CM | POA: Diagnosis not present

## 2019-07-10 DIAGNOSIS — Z79899 Other long term (current) drug therapy: Secondary | ICD-10-CM | POA: Diagnosis not present

## 2019-07-10 DIAGNOSIS — Z7982 Long term (current) use of aspirin: Secondary | ICD-10-CM | POA: Diagnosis not present

## 2019-07-10 MED ORDER — HEPARIN SOD (PORK) LOCK FLUSH 100 UNIT/ML IV SOLN
500.0000 [IU] | Freq: Once | INTRAVENOUS | Status: AC | PRN
  Administered 2019-07-10: 500 [IU]
  Filled 2019-07-10: qty 5

## 2019-07-10 MED ORDER — ALTEPLASE 2 MG IJ SOLR
2.0000 mg | Freq: Once | INTRAMUSCULAR | Status: DC | PRN
  Filled 2019-07-10: qty 2

## 2019-07-10 MED ORDER — SODIUM CHLORIDE 0.9% FLUSH
10.0000 mL | INTRAVENOUS | Status: DC | PRN
  Administered 2019-07-10: 10 mL
  Filled 2019-07-10: qty 10

## 2019-07-11 ENCOUNTER — Ambulatory Visit: Payer: Medicare HMO | Attending: Internal Medicine

## 2019-07-14 ENCOUNTER — Telehealth: Payer: Self-pay | Admitting: *Deleted

## 2019-07-14 ENCOUNTER — Other Ambulatory Visit: Payer: Self-pay | Admitting: Family

## 2019-07-14 DIAGNOSIS — M7989 Other specified soft tissue disorders: Secondary | ICD-10-CM

## 2019-07-14 DIAGNOSIS — C189 Malignant neoplasm of colon, unspecified: Secondary | ICD-10-CM

## 2019-07-14 NOTE — Telephone Encounter (Signed)
Message received from scheduling stating that patient's daughter [name] would like a call back regarding pt.'s iron results.  Call placed back to [facility] and informed her that pt's iron results are WNL per Dr. Marin Olp.  [name] states that pt is having pain and swelling to left ankle and foot.  Dr. Marin Olp notified and order received for pt to have an [place] on her left leg.  Notified pt.'s daughter of MD order.  [name] appreciative of assistance and has no further questions or concerns at this time.

## 2019-07-15 ENCOUNTER — Telehealth: Payer: Self-pay | Admitting: Endocrinology

## 2019-07-15 ENCOUNTER — Other Ambulatory Visit: Payer: Self-pay

## 2019-07-15 NOTE — Progress Notes (Unsigned)
Pharmacist Chemotherapy Monitoring - Follow Up Assessment    I verify that I have reviewed each item in the below checklist:  . Regimen for the patient is scheduled for the appropriate day and plan matches scheduled date. Marland Kitchen Appropriate non-routine labs are ordered dependent on drug ordered. . If applicable, additional medications reviewed and ordered per protocol based on lifetime cumulative doses and/or treatment regimen.   Plan for follow-up and/or issues identified: No . I-vent associated with next due treatment: No . MD and/or nursing notified: No  Meilani Edmundson, Jacqlyn Larsen 07/15/2019 8:44 AM

## 2019-07-15 NOTE — Telephone Encounter (Signed)
Patient's daughter called stating the "finger prick" was 88 last night and would like to be advised as to if this is okay or if they need to do something. Ph# [phone]

## 2019-07-16 NOTE — Telephone Encounter (Signed)
This message came in after Dr. Loanne Drilling and I left for the day. Will follow up with pt when appropriate to call as it is currently 7:28am

## 2019-07-16 NOTE — Telephone Encounter (Signed)
Please keep appt for tomorrow

## 2019-07-16 NOTE — Telephone Encounter (Signed)
Returned dtr's call to inquire about pt's CBG's. States she is doing great and does not have any concerns at this time. States she will address with Dr. Loanne Drilling during [place] tomorrow.

## 2019-07-17 ENCOUNTER — Other Ambulatory Visit: Payer: Self-pay

## 2019-07-17 ENCOUNTER — Ambulatory Visit: Payer: Medicare HMO | Admitting: Endocrinology

## 2019-07-17 ENCOUNTER — Telehealth: Payer: Self-pay | Admitting: Family Medicine

## 2019-07-17 ENCOUNTER — Encounter: Payer: Self-pay | Admitting: Endocrinology

## 2019-07-17 VITALS — BP 118/70 | HR 75 | Ht 71.0 in | Wt 179.0 lb

## 2019-07-17 DIAGNOSIS — Z794 Long term (current) use of insulin: Secondary | ICD-10-CM

## 2019-07-17 DIAGNOSIS — E1149 Type 2 diabetes mellitus with other diabetic neurological complication: Secondary | ICD-10-CM | POA: Diagnosis not present

## 2019-07-17 LAB — POCT GLYCOSYLATED HEMOGLOBIN (HGB A1C): Hemoglobin A1C: 6.9 % — AB (ref 4.0–5.6)

## 2019-07-17 NOTE — Patient Instructions (Addendum)
Please reduce the Tresiba to 20 units daily.  However, take 32 on each chemo day.   Please come back for a follow-up appointment in 2 months.   Your blood pressure is high today.  Please see your primary care provider soon, to have it rechecked check your blood sugar twice a day.  vary the time of day when you check, between before the 3 meals, and at bedtime.  also check if you have symptoms of your blood sugar being too high or too low.  please keep a record of the readings and bring it to your next appointment here (or you can bring the meter itself).  You can write it on any piece of paper.  please call us sooner if your blood sugar goes below 70, or if you have a lot of readings over 200.

## 2019-07-17 NOTE — Progress Notes (Signed)
Subjective:    Patient ID: Tonya [name], female    DOB: 1940/06/16, 79 y.o.   MRN: [id number]  HPI Pt returns for f/u of diabetes mellitus: DM type: Insulin-requiring type 2 (but lean body habitus raises possibility she is evolving type 1).   Dx'ed: [id number] Complications: CVA Therapy: insulin since 2020.  GDM: never DKA: never Severe hypoglycemia: never.   Pancreatitis: never Pancreatic imaging: normal on 2010 [place].  Other: she did not tolerate metformin-XR (abd pain); she also took insulin only for a brief time after diagnosis; edema limits rx options; she is not a candidate for aggressive glycemic control, given colon cancer Interval history: she brings a record of her cbg's which I have reviewed today.  cbg varies from 81-273.  It is in general higher as the day goes on, and for 1 day after each chemo dose.  chemo is now planned intil 5/21.  She takes decadron just 1 day after each chemotx.  She takes [place], 22 units daily, except 28 on each chemo day.   Past Medical History:  Diagnosis Date  . Cerebrovascular accident ([place])   . Chronic edema    ? venous insufficiency  . COPD (chronic obstructive pulmonary disease) ([place])    never had a problem breathing  . Diabetes mellitus without complication ([place])   . Enlarged heart    per pt  . Gallstones   . GERD (gastroesophageal reflux disease)   . Goals of care, counseling/discussion 11/01/2018  . Hyperlipidemia   . Hypertension     Past Surgical History:  Procedure Laterality Date  . ABDOMINAL HYSTERECTOMY    . BIOPSY  10/18/2018   Procedure: BIOPSY;  Surgeon: Jackquline Denmark, MD;  Location: [facility] ENDOSCOPY;  Service: Endoscopy;;  . CHOLECYSTECTOMY    . COLONOSCOPY WITH PROPOFOL N/A 10/18/2018   Procedure: COLONOSCOPY WITH PROPOFOL;  Surgeon: Jackquline Denmark, MD;  Location: [facility] ENDOSCOPY;  Service: Endoscopy;  Laterality: N/A;  . IR IMAGING GUIDED PORT INSERTION  11/11/2018  . POLYPECTOMY  10/18/2018   Procedure: POLYPECTOMY;  Surgeon:  Jackquline Denmark, MD;  Location: [facility] ENDOSCOPY;  Service: Endoscopy;;  . SUBMUCOSAL TATTOO INJECTION  10/18/2018   Procedure: SUBMUCOSAL TATTOO INJECTION;  Surgeon: Jackquline Denmark, MD;  Location: [facility] ENDOSCOPY;  Service: Endoscopy;;    Social History   Socioeconomic History  . Marital status: Widowed    Spouse name: Not on file  . Number of children: 5  . Years of education: Not on file  . Highest education level: Not on file  Occupational History  . Not on file  Tobacco Use  . Smoking status: Former Smoker    Packs/day: 0.25    Years: 1.00    Pack years: 0.25    Quit date: 07/23/1968    Years since quitting: 51.0  . Smokeless tobacco: Never Used  Substance and Sexual Activity  . Alcohol use: No    Alcohol/week: 0.0 standard drinks  . Drug use: No  . Sexual activity: Not Currently  Other Topics Concern  . Not on file  Social History Narrative  . Not on file   Social Determinants of Health   Financial Resource Strain:   . Difficulty of Paying Living Expenses:   Food Insecurity:   . Worried About Charity fundraiser in the Last Year:   . Arboriculturist in the Last Year:   Transportation Needs:   . Film/video editor (Medical):   Marland Kitchen Lack of Transportation (Non-Medical):   Physical Activity:   .  Days of Exercise per Week:   . Minutes of Exercise per Session:   Stress:   . Feeling of Stress :   Social Connections:   . Frequency of Communication with Friends and Family:   . Frequency of Social Gatherings with Friends and Family:   . Attends Religious Services:   . Active Member of Clubs or Organizations:   . Attends Archivist Meetings:   Marland Kitchen Marital Status:   Intimate Partner Violence:   . Fear of Current or Ex-Partner:   . Emotionally Abused:   Marland Kitchen Physically Abused:   . Sexually Abused:     Current Outpatient Medications on File Prior to Visit  Medication Sig Dispense Refill  . Alcohol Swabs (B-D SINGLE USE SWABS REGULAR) PADS 1 each by Does not apply  route as needed (to cleanse site prior to obtaining droplet of blood for CBG's). E11.9 100 each 2  . amLODipine (NORVASC) 10 MG tablet Take 1 tablet (10 mg total) by mouth daily. 90 tablet 0  . aspirin EC 81 MG tablet Take 81 mg by mouth daily.    Marland Kitchen atorvastatin (LIPITOR) 10 MG tablet Take 1 tablet (10 mg total) by mouth daily. 90 tablet 1  . Blood Glucose Calibration (TRUE METRIX LEVEL 1) Low SOLN 1 each by Other route as needed (to calibrate glucometer). E11.9 1 each 0  . Blood Glucose Monitoring Suppl (TRUE METRIX AIR GLUCOSE METER) DEVI 1 each by Does not apply route 2 (two) times daily. E11.9 1 each 0  . Cholecalciferol 2000 units TABS Take 1 tablet (2,000 Units total) by mouth daily. 90 tablet 1  . dexamethasone (DECADRON) 4 MG tablet Take 2 tablets (8 mg total) by mouth 2 (two) times daily. For four days following chemotherapy. 30 tablet 2  . dronabinol (MARINOL) 2.5 MG capsule Take 1 capsule (2.5 mg total) by mouth 2 (two) times daily before a meal. 60 capsule 0  . glucose blood (TRUE METRIX BLOOD GLUCOSE TEST) test strip 1 each by Other route 2 (two) times daily. E11.9 180 each 0  . insulin degludec (TRESIBA FLEXTOUCH) 100 UNIT/ML FlexTouch Pen Inject 0.2 mLs (20 Units total) into the skin daily. 5 pen 11  . Insulin Pen Needle (PEN NEEDLES) 32G X 4 MM MISC 1 each by Does not apply route daily. E11.9 100 each 0  . lidocaine (XYLOCAINE) 2 % solution     . lidocaine-prilocaine (EMLA) cream     . loperamide (IMODIUM A-D) 2 MG tablet Take 2 tablets (4 mg total) by mouth 4 (four) times daily as needed. Take 2 at diarrhea onset , then 1 every 2hr until 12hrs with no BM. May take 2 every 4hrs at night. If diarrhea recurs repeat. 100 tablet 1  . losartan (COZAAR) 50 MG tablet TAKE 1 TABLET BY MOUTH IN THE MORNING 90 tablet 1  . magic mouthwash SOLN Swish and swallow equal parts Benadryl, Lidocaine and Nystatin.  5- 10 ml.'s four times a day as needed. 240 mL 0  . meclizine (ANTIVERT) 12.5 MG tablet  TAKE 1 TABLET(12.5 MG) BY MOUTH THREE TIMES DAILY AS NEEDED FOR DIZZINESS 30 tablet 2  . metoprolol tartrate (LOPRESSOR) 25 MG tablet TAKE 1 TABLET BY MOUTH TWICE A DAY 180 tablet 0  . olmesartan (BENICAR) 20 MG tablet Take 20 mg by mouth daily.    . temazepam (RESTORIL) 7.5 MG capsule Take 1 capsule (7.5 mg total) by mouth at bedtime as needed for sleep. 30 capsule 0  .  traMADol (ULTRAM) 50 MG tablet Take 1 tablet (50 mg total) by mouth every 6 (six) hours as needed. 30 tablet 0  . TRUEplus Lancets 30G MISC 1 each by Does not apply route 2 (two) times daily. E11.9 180 each 0  . [DISCONTINUED] prochlorperazine (COMPAZINE) 10 MG tablet Take 1 tablet (10 mg total) by mouth every 6 (six) hours as needed (Nausea or vomiting). 30 tablet 1   No current facility-administered medications on file prior to visit.    Allergies  Allergen Reactions  . Hydrocodone Swelling and Other (See Comments)    "I started swelling, became red, and passed out"  . Iohexol Hives, Shortness Of Breath and Other (See Comments)    Patient developed hives and fullness in throat post injection of 125cc's Omni 300, Onset Date: 11/15/2006   . Naproxen Other (See Comments)    "It made me feel out of my head"  . Ibuprofen Nausea Only  . Propoxyphene N-Acetaminophen Other (See Comments)    "I couldn't find the door to make my way out of the room- I was in misery"    Family History  Problem Relation Age of Onset  . Cirrhosis Mother   . Heart disease Father   . Cancer Other   . Hypertension Other   . Diabetes Other     BP 118/70   Pulse 75   Ht 5\' 11"  (1.803 m)   Wt 179 lb (81.2 kg)   SpO2 90%   BMI 24.97 kg/m    Review of Systems She denies hypoglycemia    Objective:   Physical Exam VITAL SIGNS:  See vs page GENERAL: no distress Pulses: dorsalis pedis intact bilat.   MSK: no deformity of the feet CV: 2+ left leg edema, and trace on the right Skin:  no ulcer on the feet.  normal color and temp on the  feet. Neuro: sensation is intact to touch on the feet   Lab Results  Component Value Date   HGBA1C 6.9 (A) 07/17/2019       Assessment & Plan:  Insulin-requiring type 2 DM, with CVA: overcontrolled Stage 4 cancer: she is npt a candidate for aggressive glycemic control edema: we discussed.  dtr says prob due to cva, but she is sched for venous doppler.   Patient Instructions  Please reduce the Tresiba to 20 units daily.  However, take 32 on each chemo day.   Please come back for a follow-up appointment in 2 months.   Your blood pressure is high today.  Please see your primary care provider soon, to have it rechecked check your blood sugar twice a day.  vary the time of day when you check, between before the 3 meals, and at bedtime.  also check if you have symptoms of your blood sugar being too high or too low.  please keep a record of the readings and bring it to your next appointment here (or you can bring the meter itself).  You can write it on any piece of paper.  please call us sooner if your blood sugar goes below 70, or if you have a lot of readings over 200.

## 2019-07-17 NOTE — Telephone Encounter (Signed)
[facility] [employer] sent another fax requesting Dr. Loanne Drilling complete CMN and Rx for diabetic shoes and inserts. As previously documented, per Dr. Loanne Drilling, pt does not qualify. Documents were not completed nor signed. However, did return fax indicating DOES NOT QUALIFY. Confirmation received.

## 2019-07-17 NOTE — Telephone Encounter (Signed)
Patient daughter called and states that she can not pick up the following mediation at [facility] and is needing a 90 day supply to [place] order  losartan (COZAAR) 50 MG tablet    [place], [place] Phone:  [phone]  Fax:  [phone]

## 2019-07-21 ENCOUNTER — Other Ambulatory Visit: Payer: Self-pay | Admitting: Emergency Medicine

## 2019-07-21 DIAGNOSIS — E785 Hyperlipidemia, unspecified: Secondary | ICD-10-CM

## 2019-07-21 DIAGNOSIS — I10 Essential (primary) hypertension: Secondary | ICD-10-CM

## 2019-07-21 MED ORDER — LOSARTAN POTASSIUM 50 MG PO TABS: ORAL_TABLET | ORAL | 1 refills | Status: DC

## 2019-07-21 NOTE — Telephone Encounter (Signed)
Rx has been sent to [facility]  for 90 day

## 2019-07-22 ENCOUNTER — Ambulatory Visit: Payer: Medicare HMO

## 2019-07-22 ENCOUNTER — Other Ambulatory Visit: Payer: Medicare HMO

## 2019-07-22 ENCOUNTER — Ambulatory Visit: Payer: Medicare HMO | Admitting: Family

## 2019-07-22 NOTE — Progress Notes (Unsigned)
Pharmacist Chemotherapy Monitoring - Follow Up Assessment    I verify that I have reviewed each item in the below checklist:  . Regimen for the patient is scheduled for the appropriate day and plan matches scheduled date. Marland Kitchen Appropriate non-routine labs are ordered dependent on drug ordered. . If applicable, additional medications reviewed and ordered per protocol based on lifetime cumulative doses and/or treatment regimen.   Plan for follow-up and/or issues identified: No . I-vent associated with next due treatment: No . MD and/or nursing notified: No  Tonya [name], Jacqlyn Larsen 07/22/2019 1:30 PM

## 2019-07-29 ENCOUNTER — Inpatient Hospital Stay: Payer: Medicare HMO

## 2019-07-29 ENCOUNTER — Other Ambulatory Visit: Payer: Self-pay | Admitting: Family Medicine

## 2019-07-29 ENCOUNTER — Other Ambulatory Visit: Payer: Self-pay

## 2019-07-29 ENCOUNTER — Inpatient Hospital Stay: Payer: Medicare HMO | Admitting: Family

## 2019-07-29 ENCOUNTER — Inpatient Hospital Stay: Payer: Medicare HMO | Attending: Hematology & Oncology

## 2019-07-29 ENCOUNTER — Telehealth: Payer: Self-pay | Admitting: Family Medicine

## 2019-07-29 ENCOUNTER — Other Ambulatory Visit: Payer: Self-pay | Admitting: Family

## 2019-07-29 DIAGNOSIS — Z7982 Long term (current) use of aspirin: Secondary | ICD-10-CM | POA: Insufficient documentation

## 2019-07-29 DIAGNOSIS — Z79899 Other long term (current) drug therapy: Secondary | ICD-10-CM | POA: Insufficient documentation

## 2019-07-29 DIAGNOSIS — Z794 Long term (current) use of insulin: Secondary | ICD-10-CM | POA: Insufficient documentation

## 2019-07-29 DIAGNOSIS — D509 Iron deficiency anemia, unspecified: Secondary | ICD-10-CM | POA: Insufficient documentation

## 2019-07-29 DIAGNOSIS — M5416 Radiculopathy, lumbar region: Secondary | ICD-10-CM

## 2019-07-29 DIAGNOSIS — C182 Malignant neoplasm of ascending colon: Secondary | ICD-10-CM | POA: Insufficient documentation

## 2019-07-29 DIAGNOSIS — G8929 Other chronic pain: Secondary | ICD-10-CM

## 2019-07-29 DIAGNOSIS — K2970 Gastritis, unspecified, without bleeding: Secondary | ICD-10-CM | POA: Insufficient documentation

## 2019-07-29 DIAGNOSIS — C189 Malignant neoplasm of colon, unspecified: Secondary | ICD-10-CM

## 2019-07-29 DIAGNOSIS — Z8673 Personal history of transient ischemic attack (TIA), and cerebral infarction without residual deficits: Secondary | ICD-10-CM | POA: Insufficient documentation

## 2019-07-29 DIAGNOSIS — Z7952 Long term (current) use of systemic steroids: Secondary | ICD-10-CM | POA: Insufficient documentation

## 2019-07-29 DIAGNOSIS — K5900 Constipation, unspecified: Secondary | ICD-10-CM | POA: Insufficient documentation

## 2019-07-29 DIAGNOSIS — Z5112 Encounter for antineoplastic immunotherapy: Secondary | ICD-10-CM | POA: Insufficient documentation

## 2019-07-29 DIAGNOSIS — Z5111 Encounter for antineoplastic chemotherapy: Secondary | ICD-10-CM | POA: Insufficient documentation

## 2019-07-29 NOTE — Progress Notes (Signed)
Pharmacist Chemotherapy Monitoring - Follow Up Assessment    I verify that I have reviewed each item in the below checklist:  . Regimen for the patient is scheduled for the appropriate day and plan matches scheduled date. Marland Kitchen Appropriate non-routine labs are ordered dependent on drug ordered. . If applicable, additional medications reviewed and ordered per protocol based on lifetime cumulative doses and/or treatment regimen.   Plan for follow-up and/or issues identified: No . I-vent associated with next due treatment: No . MD and/or nursing notified: No  Tonya [name], Jacqlyn Larsen 07/29/2019 3:22 PM

## 2019-07-29 NOTE — Telephone Encounter (Signed)
Please advise or did patient need a office visit

## 2019-07-29 NOTE — Telephone Encounter (Signed)
Patient daughter called and states she is needing a authorization sent to [facility] the patient insurance for a wheel chair. Daughter can be reached at [phone]

## 2019-07-30 ENCOUNTER — Inpatient Hospital Stay: Payer: Medicare HMO

## 2019-07-30 ENCOUNTER — Telehealth: Payer: Self-pay | Admitting: Family

## 2019-07-30 ENCOUNTER — Encounter: Payer: Self-pay | Admitting: Family

## 2019-07-30 ENCOUNTER — Inpatient Hospital Stay (HOSPITAL_BASED_OUTPATIENT_CLINIC_OR_DEPARTMENT_OTHER): Payer: Medicare HMO | Admitting: Family

## 2019-07-30 VITALS — BP 148/78 | HR 76 | Temp 97.1°F | Resp 18 | Ht 71.0 in | Wt 180.0 lb

## 2019-07-30 VITALS — BP 148/78 | HR 76 | Temp 97.1°F | Resp 18

## 2019-07-30 DIAGNOSIS — C787 Secondary malignant neoplasm of liver and intrahepatic bile duct: Secondary | ICD-10-CM

## 2019-07-30 DIAGNOSIS — Z794 Long term (current) use of insulin: Secondary | ICD-10-CM | POA: Diagnosis not present

## 2019-07-30 DIAGNOSIS — C189 Malignant neoplasm of colon, unspecified: Secondary | ICD-10-CM

## 2019-07-30 DIAGNOSIS — Z7982 Long term (current) use of aspirin: Secondary | ICD-10-CM | POA: Diagnosis not present

## 2019-07-30 DIAGNOSIS — D500 Iron deficiency anemia secondary to blood loss (chronic): Secondary | ICD-10-CM | POA: Diagnosis not present

## 2019-07-30 DIAGNOSIS — Z8673 Personal history of transient ischemic attack (TIA), and cerebral infarction without residual deficits: Secondary | ICD-10-CM | POA: Diagnosis not present

## 2019-07-30 DIAGNOSIS — K5900 Constipation, unspecified: Secondary | ICD-10-CM | POA: Diagnosis not present

## 2019-07-30 DIAGNOSIS — Z5111 Encounter for antineoplastic chemotherapy: Secondary | ICD-10-CM | POA: Diagnosis not present

## 2019-07-30 DIAGNOSIS — Z7952 Long term (current) use of systemic steroids: Secondary | ICD-10-CM | POA: Diagnosis not present

## 2019-07-30 DIAGNOSIS — Z79899 Other long term (current) drug therapy: Secondary | ICD-10-CM | POA: Diagnosis not present

## 2019-07-30 DIAGNOSIS — Z5112 Encounter for antineoplastic immunotherapy: Secondary | ICD-10-CM | POA: Diagnosis not present

## 2019-07-30 DIAGNOSIS — K2970 Gastritis, unspecified, without bleeding: Secondary | ICD-10-CM | POA: Diagnosis not present

## 2019-07-30 DIAGNOSIS — C182 Malignant neoplasm of ascending colon: Secondary | ICD-10-CM | POA: Diagnosis not present

## 2019-07-30 DIAGNOSIS — C187 Malignant neoplasm of sigmoid colon: Secondary | ICD-10-CM | POA: Diagnosis not present

## 2019-07-30 DIAGNOSIS — D509 Iron deficiency anemia, unspecified: Secondary | ICD-10-CM | POA: Diagnosis not present

## 2019-07-30 LAB — CBC WITH DIFFERENTIAL (CANCER CENTER ONLY)
Abs Immature Granulocytes: 0.01 10*3/uL (ref 0.00–0.07)
Basophils Absolute: 0 10*3/uL (ref 0.0–0.1)
Basophils Relative: 0 %
Eosinophils Absolute: 0 10*3/uL (ref 0.0–0.5)
Eosinophils Relative: 1 %
HCT: 39.3 % (ref 36.0–46.0)
Hemoglobin: 12 g/dL (ref 12.0–15.0)
Immature Granulocytes: 0 %
Lymphocytes Relative: 34 %
Lymphs Abs: 1.6 10*3/uL (ref 0.7–4.0)
MCH: 27.1 pg (ref 26.0–34.0)
MCHC: 30.5 g/dL (ref 30.0–36.0)
MCV: 88.9 fL (ref 80.0–100.0)
Monocytes Absolute: 0.6 10*3/uL (ref 0.1–1.0)
Monocytes Relative: 12 %
Neutro Abs: 2.5 10*3/uL (ref 1.7–7.7)
Neutrophils Relative %: 53 %
Platelet Count: 158 10*3/uL (ref 150–400)
RBC: 4.42 MIL/uL (ref 3.87–5.11)
RDW: 15.2 % (ref 11.5–15.5)
WBC Count: 4.8 10*3/uL (ref 4.0–10.5)
nRBC: 0 % (ref 0.0–0.2)

## 2019-07-30 LAB — CMP (CANCER CENTER ONLY)
ALT: 13 U/L (ref 0–44)
AST: 16 U/L (ref 15–41)
Albumin: 3.8 g/dL (ref 3.5–5.0)
Alkaline Phosphatase: 82 U/L (ref 38–126)
Anion gap: 7 (ref 5–15)
BUN: 18 mg/dL (ref 8–23)
CO2: 29 mmol/L (ref 22–32)
Calcium: 9.5 mg/dL (ref 8.9–10.3)
Chloride: 106 mmol/L (ref 98–111)
Creatinine: 0.79 mg/dL (ref 0.44–1.00)
GFR, Est AFR Am: >60 mL/min (ref >60)
GFR, Estimated: >60 mL/min (ref >60)
Glucose, Bld: 193 mg/dL — ABNORMAL HIGH (ref 70–99)
Potassium: 4 mmol/L (ref 3.5–5.1)
Sodium: 142 mmol/L (ref 135–145)
Total Bilirubin: 0.8 mg/dL (ref 0.3–1.2)
Total Protein: 6 g/dL — ABNORMAL LOW (ref 6.5–8.1)

## 2019-07-30 MED ORDER — SODIUM CHLORIDE 0.9 % IV SOLN
10.0000 mg | Freq: Once | INTRAVENOUS | Status: AC
  Administered 2019-07-30: 10 mg via INTRAVENOUS
  Filled 2019-07-30: qty 10

## 2019-07-30 MED ORDER — PALONOSETRON HCL INJECTION 0.25 MG/5ML
INTRAVENOUS | Status: AC
  Filled 2019-07-30: qty 5

## 2019-07-30 MED ORDER — SODIUM CHLORIDE 0.9 % IV SOLN
Freq: Once | INTRAVENOUS | Status: AC
  Filled 2019-07-30: qty 250

## 2019-07-30 MED ORDER — FLUOROURACIL CHEMO INJECTION 2.5 GM/50ML
320.0000 mg/m2 | Freq: Once | INTRAVENOUS | Status: AC
  Administered 2019-07-30: 15:00:00 650 mg via INTRAVENOUS
  Filled 2019-07-30: qty 13

## 2019-07-30 MED ORDER — SODIUM CHLORIDE 0.9 % IV SOLN
400.0000 mg/m2 | Freq: Once | INTRAVENOUS | Status: AC
  Administered 2019-07-30: 13:00:00 784 mg via INTRAVENOUS
  Filled 2019-07-30: qty 39.2

## 2019-07-30 MED ORDER — SODIUM CHLORIDE 0.9 % IV SOLN
135.0000 mg/m2 | Freq: Once | INTRAVENOUS | Status: AC
  Administered 2019-07-30: 260 mg via INTRAVENOUS
  Filled 2019-07-30: qty 5

## 2019-07-30 MED ORDER — SODIUM CHLORIDE 0.9 % IV SOLN
5.0000 mg/kg | Freq: Once | INTRAVENOUS | Status: AC
  Administered 2019-07-30: 12:00:00 400 mg via INTRAVENOUS
  Filled 2019-07-30: qty 16

## 2019-07-30 MED ORDER — PALONOSETRON HCL INJECTION 0.25 MG/5ML
0.2500 mg | Freq: Once | INTRAVENOUS | Status: AC
  Administered 2019-07-30: 0.25 mg via INTRAVENOUS

## 2019-07-30 MED ORDER — SODIUM CHLORIDE 0.9 % IV SOLN
1920.0000 mg/m2 | INTRAVENOUS | Status: DC
  Administered 2019-07-30: 15:00:00 3750 mg via INTRAVENOUS
  Filled 2019-07-30: qty 75

## 2019-07-30 NOTE — Progress Notes (Signed)
Hematology and Oncology Follow Up Visit  GIANNY KILLMAN [id number] Sep 13, 1940 79 y.o. 07/30/2019   Principle Diagnosis:  Stage IV adenocarcinoma of the ascending colon -- NRAS+, BRAF-, HER2-, MSI/MMR -,  Iron deficiency anemia  Past Therapy: FOLFOX - s/p cycle 4  Current Therapy:        FOLFIRI/Bevacizumab -started 01/20/2019,s/p cycle9 IV Iron as indicated   Interim History:  Ms. Talamante is here today for follow-up and treatment. She states that she is doing well.  CEA last month was down to 1.32.  She has noted some numbness in her right thumb over the last 3 weeks. No other numbness or tingling noted.  She has had no issue with infection. No fever, chills, n/v, cough, rash, SOB, chest pain or changes in bowel or bladder habits.  She had a little left sided discomfort yesterday that resolved after passing gas.  She will sometimes have constipation which she states resolves with drinking prune juice.  She has occasional palpitations and dizziness. This is unchanged from her baseline.  The swelling in the left side (effected by previous stroke) is stable/unchanged.  No falls or syncopal episodes to report.  She has a good appetite and states that she is staying hydrated. She is drinking a Glucerna daily as well.  No episodes of bleeding. No bruising or petechiae.   ECOG Performance Status: 2 - Symptomatic, <50% confined to bed  Medications:  Allergies as of 07/30/2019      Reactions   Hydrocodone Swelling, Other (See Comments)   "I started swelling, became red, and passed out"   Iohexol Hives, Shortness Of Breath, Other (See Comments)   Patient developed hives and fullness in throat post injection of 125cc's Omni 300, Onset Date: 11/15/2006   Naproxen Other (See Comments)   "It made me feel out of my head"   Ibuprofen Nausea Only   Propoxyphene N-acetaminophen Other (See Comments)   "I couldn't find the door to make my way out of the room- I was in misery"      Medication  List       Accurate as of July 30, 2019 11:12 AM. If you have any questions, ask your nurse or doctor.        amLODipine 10 MG tablet Commonly known as: NORVASC Take 1 tablet (10 mg total) by mouth daily.   aspirin EC 81 MG tablet Take 81 mg by mouth daily.   atorvastatin 10 MG tablet Commonly known as: LIPITOR Take 1 tablet (10 mg total) by mouth daily.   B-D SINGLE USE SWABS REGULAR Pads 1 each by Does not apply route as needed (to cleanse site prior to obtaining droplet of blood for CBG's). E11.9   Cholecalciferol 50 MCG (2000 UT) Tabs Take 1 tablet (2,000 Units total) by mouth daily.   dexamethasone 4 MG tablet Commonly known as: DECADRON Take 2 tablets (8 mg total) by mouth 2 (two) times daily. For four days following chemotherapy.   dronabinol 2.5 MG capsule Commonly known as: MARINOL Take 1 capsule (2.5 mg total) by mouth 2 (two) times daily before a meal.   lidocaine 2 % solution Commonly known as: XYLOCAINE   lidocaine-prilocaine cream Commonly known as: EMLA   loperamide 2 MG tablet Commonly known as: Imodium A-D Take 2 tablets (4 mg total) by mouth 4 (four) times daily as needed. Take 2 at diarrhea onset , then 1 every 2hr until 12hrs with no BM. May take 2 every 4hrs at night. If diarrhea recurs  repeat.   losartan 50 MG tablet Commonly known as: COZAAR TAKE 1 TABLET BY MOUTH IN THE MORNING   magic mouthwash Soln Swish and swallow equal parts Benadryl, Lidocaine and Nystatin.  5- 10 ml.'s four times a day as needed.   meclizine 12.5 MG tablet Commonly known as: ANTIVERT TAKE 1 TABLET(12.5 MG) BY MOUTH THREE TIMES DAILY AS NEEDED FOR DIZZINESS   metoprolol tartrate 25 MG tablet Commonly known as: LOPRESSOR TAKE 1 TABLET BY MOUTH TWICE A DAY   olmesartan 20 MG tablet Commonly known as: BENICAR Take 20 mg by mouth daily.   Pen Needles 32G X 4 MM Misc 1 each by Does not apply route daily. E11.9   temazepam 7.5 MG capsule Commonly known as:  Restoril Take 1 capsule (7.5 mg total) by mouth at bedtime as needed for sleep.   traMADol 50 MG tablet Commonly known as: ULTRAM Take 1 tablet (50 mg total) by mouth every 6 (six) hours as needed.   Tyler Aas FlexTouch 100 UNIT/ML FlexTouch Pen Generic drug: insulin degludec Inject 0.2 mLs (20 Units total) into the skin daily.   True Metrix Air Glucose Meter Devi 1 each by Does not apply route 2 (two) times daily. E11.9   True Metrix Blood Glucose Test test strip Generic drug: glucose blood 1 each by Other route 2 (two) times daily. E11.9   True Metrix Level 1 Low Soln 1 each by Other route as needed (to calibrate glucometer). E11.9   TRUEplus Lancets 30G Misc 1 each by Does not apply route 2 (two) times daily. E11.9       Allergies:  Allergies  Allergen Reactions  . Hydrocodone Swelling and Other (See Comments)    "I started swelling, became red, and passed out"  . Iohexol Hives, Shortness Of Breath and Other (See Comments)    Patient developed hives and fullness in throat post injection of 125cc's Omni 300, Onset Date: 11/15/2006   . Naproxen Other (See Comments)    "It made me feel out of my head"  . Ibuprofen Nausea Only  . Propoxyphene N-Acetaminophen Other (See Comments)    "I couldn't find the door to make my way out of the room- I was in misery"    Past Medical History, Surgical history, Social history, and Family History were reviewed and updated.  Review of Systems: All other 10 point review of systems is negative.   Physical Exam:  height is _0  (1.803 m) and weight is 180 lb (81.6 kg). Her temporal temperature is 97.1 F (36.2 C) (abnormal). Her blood pressure is 148/78 (abnormal) and her pulse is 76. Her respiration is 18 and oxygen saturation is 100%.   Wt Readings from Last 3 Encounters:  07/30/19 180 lb (81.6 kg)  07/17/19 179 lb (81.2 kg)  07/08/19 179 lb (81.2 kg)    Ocular: Sclerae unicteric, pupils equal, round and reactive to  light Ear-nose-throat: Oropharynx clear, dentition fair Lymphatic: No cervical or supraclavicular adenopathy Lungs no rales or rhonchi, good excursion bilaterally Heart regular rate and rhythm, no murmur appreciated Abd soft, nontender, positive bowel sounds, no liver or spleen tip palpated on exam, no fluid wave  MSK no focal spinal tenderness, no joint edema Neuro: non-focal, well-oriented, appropriate affect Breasts: Deferred   Lab Results  Component Value Date   WBC 4.8 07/30/2019   HGB 12.0 07/30/2019   HCT 39.3 07/30/2019   MCV 88.9 07/30/2019   PLT 158 07/30/2019   Lab Results  Component Value Date  FERRITIN 312 (H) 07/08/2019   IRON 63 07/08/2019   TIBC 271 07/08/2019   UIBC 209 07/08/2019   IRONPCTSAT 23 07/08/2019   Lab Results  Component Value Date   RETICCTPCT 1.5 05/17/2016   RBC 4.42 07/30/2019   RETICCTABS 62,400 05/17/2016   No results found for: KPAFRELGTCHN, LAMBDASER, KAPLAMBRATIO No results found for: IGGSERUM, IGA, IGMSERUM No results found for: Odetta Pink, SPEI   Chemistry      Component Value Date/Time   NA 142 07/30/2019 1002   NA 139 09/30/2018 1311   K 4.0 07/30/2019 1002   CL 106 07/30/2019 1002   CO2 29 07/30/2019 1002   BUN 18 07/30/2019 1002   BUN 10 09/30/2018 1311   CREATININE 0.79 07/30/2019 1002      Component Value Date/Time   CALCIUM 9.5 07/30/2019 1002   ALKPHOS 82 07/30/2019 1002   AST 16 07/30/2019 1002   ALT 13 07/30/2019 1002   BILITOT 0.8 07/30/2019 1002       Impression and Plan: Ms. Fronczak is a very pleasant 79 yo African American female with metastatic colon cancer. We will proceed with treatment today as planned per Dr. Marin Olp.  Iron studies are pending and we will replace if needed.  I did go over her lab work in detail with her daughter and gave her a copy of the labs to take home.  We will plan to repeat a PET scan in mid May.  We will see her again  in 3 weeks.  They will contact our office with any questions or concerns. We can certainly see her sooner if needed.   Laverna Peace, NP 4/7/202111:12 AM

## 2019-07-30 NOTE — Patient Instructions (Addendum)
[facility] Discharge Instructions for Patients Receiving Chemotherapy  Today you received the following chemotherapy agents Irinotecan, Bevacizumab, Leucovorin and 5FU  To help prevent nausea and vomiting after your treatment, we encourage you to take your nausea medication as prescribed by MD.   If you develop nausea and vomiting that is not controlled by your nausea medication, call the clinic.   BELOW ARE SYMPTOMS THAT SHOULD BE REPORTED IMMEDIATELY:  *FEVER GREATER THAN 100.5 F  *CHILLS WITH OR WITHOUT FEVER  NAUSEA AND VOMITING THAT IS NOT CONTROLLED WITH YOUR NAUSEA MEDICATION  *UNUSUAL SHORTNESS OF BREATH  *UNUSUAL BRUISING OR BLEEDING  TENDERNESS IN MOUTH AND THROAT WITH OR WITHOUT PRESENCE OF ULCERS  *URINARY PROBLEMS  *BOWEL PROBLEMS  UNUSUAL RASH Items with * indicate a potential emergency and should be followed up as soon as possible.  Feel free to call the clinic should you have any questions or concerns. The clinic phone number is [phone].  Please show the [facility] at check-in to the Emergency Department and triage nurse.

## 2019-07-30 NOTE — Telephone Encounter (Signed)
Appointments scheduled calendar printed per 4/7 los

## 2019-07-30 NOTE — Patient Instructions (Signed)

## 2019-07-31 ENCOUNTER — Inpatient Hospital Stay: Payer: Medicare HMO

## 2019-08-01 ENCOUNTER — Inpatient Hospital Stay: Payer: Medicare HMO

## 2019-08-01 ENCOUNTER — Other Ambulatory Visit: Payer: Self-pay

## 2019-08-01 VITALS — BP 142/74 | HR 75 | Temp 97.1°F | Resp 18

## 2019-08-01 DIAGNOSIS — C189 Malignant neoplasm of colon, unspecified: Secondary | ICD-10-CM

## 2019-08-01 DIAGNOSIS — D509 Iron deficiency anemia, unspecified: Secondary | ICD-10-CM | POA: Diagnosis not present

## 2019-08-01 DIAGNOSIS — C182 Malignant neoplasm of ascending colon: Secondary | ICD-10-CM | POA: Diagnosis not present

## 2019-08-01 DIAGNOSIS — Z8673 Personal history of transient ischemic attack (TIA), and cerebral infarction without residual deficits: Secondary | ICD-10-CM | POA: Diagnosis not present

## 2019-08-01 DIAGNOSIS — K5900 Constipation, unspecified: Secondary | ICD-10-CM | POA: Diagnosis not present

## 2019-08-01 DIAGNOSIS — C787 Secondary malignant neoplasm of liver and intrahepatic bile duct: Secondary | ICD-10-CM

## 2019-08-01 DIAGNOSIS — K2970 Gastritis, unspecified, without bleeding: Secondary | ICD-10-CM | POA: Diagnosis not present

## 2019-08-01 DIAGNOSIS — Z7952 Long term (current) use of systemic steroids: Secondary | ICD-10-CM | POA: Diagnosis not present

## 2019-08-01 DIAGNOSIS — Z5111 Encounter for antineoplastic chemotherapy: Secondary | ICD-10-CM | POA: Diagnosis not present

## 2019-08-01 DIAGNOSIS — Z5112 Encounter for antineoplastic immunotherapy: Secondary | ICD-10-CM | POA: Diagnosis not present

## 2019-08-01 DIAGNOSIS — Z794 Long term (current) use of insulin: Secondary | ICD-10-CM | POA: Diagnosis not present

## 2019-08-01 MED ORDER — HEPARIN SOD (PORK) LOCK FLUSH 100 UNIT/ML IV SOLN
500.0000 [IU] | Freq: Once | INTRAVENOUS | Status: AC | PRN
  Administered 2019-08-01: 500 [IU]
  Filled 2019-08-01: qty 5

## 2019-08-01 MED ORDER — SODIUM CHLORIDE 0.9% FLUSH
10.0000 mL | INTRAVENOUS | Status: DC | PRN
  Administered 2019-08-01: 10 mL
  Filled 2019-08-01: qty 10

## 2019-08-01 NOTE — Patient Instructions (Signed)
Tunneled Central Venous Catheter Flushing Guide  It is important to flush your tunneled central venous catheter each time you use it, both before and after you use it. Flushing your catheter will help prevent it from clogging. What are the risks? Risks may include:  Infection.  Air getting into the catheter and bloodstream. Supplies needed:  A clean pair of gloves.  A disinfecting wipe. Use an alcohol wipe, chlorhexidine wipe, or iodine wipe as told by your health care provider.  A 10 mL syringe that has been prefilled with saline solution.  An empty 10 mL syringe, if a substance called heparin was injected into your catheter. How to flush your catheter When you flush your catheter, make sure you follow any specific instructions from your health care provider or the manufacturer. These are general guidelines. Flushing your catheter before use If there is heparin in your catheter: 1. Wash your hands with soap and water. 2. Put on gloves. 3. Scrub the injection cap for a minimum of 15 seconds with a disinfecting wipe. 4. Unclamp the catheter. 5. Attach the empty syringe to the injection cap. 6. Pull the syringe plunger back and withdraw 10 mL of blood. 7. Place the syringe into an appropriate waste container. 8. Scrub the injection cap for 15 seconds with a disinfecting wipe. 9. Attach the prefilled syringe to the injection cap. 10. Flush the catheter by pushing the plunger forward until all the liquid from the syringe is in the catheter. 11. Remove the syringe from the injection cap. 12. Clamp the catheter. If there is no heparin in your catheter: 1. Wash your hands with soap and water. 2. Put on gloves. 3. Scrub the injection cap for 15 seconds with a disinfecting wipe. 4. Unclamp the catheter. 5. Attach the prefilled syringe to the injection cap. 6. Flush the catheter by pushing the plunger forward until 5 mL of the liquid from the syringe is in the catheter. 7. Pull back on  the syringe until you see blood in the catheter. 8. If you have been asked to collect any blood, follow your health care provider's instructions. Otherwise, flush the catheter with the rest of the solution from the syringe. 9. Remove the syringe from the injection cap. 10. Clamp the catheter.  Flushing your catheter after use 1. Wash your hands with soap and water. 2. Put on gloves. 3. Scrub the injection cap for 15 seconds with a disinfecting wipe. 4. Unclamp the catheter. 5. Attach the prefilled syringe to the injection cap. 6. Flush the catheter by pushing the plunger forward until all of the liquid from the syringe is in the catheter. 7. Remove the syringe from the injection cap. 8. Clamp the catheter. Problems and solutions  If blood cannot be completely cleared from the injection cap, you may need to have the injection cap replaced.  If the catheter is difficult to flush, use the pulsing method. The pulsing method involves pushing only a few milliliters of solution into the catheter at a time and pausing between pushes.  If you do not see blood in the catheter when you pull back on the syringe, change your body position, such as by raising your arms above your head. Take a deep breath and cough. Then, pull back on the syringe. If you still do not see blood, flush the catheter with a small amount of solution. Then, change positions again and take a breath or cough. Pull back on the syringe again. If you still do not see   blood, finish flushing the catheter and contact your health care provider. Do not use your catheter until your health care provider says it is okay. General tips  Have someone help you flush your catheter, if possible.  Do not force fluid through your catheter.  Do not use a syringe that is larger or smaller than 10 mL. Using a smaller syringe can make the catheter burst.  Do not use your catheter without flushing it first if it has heparin in it. Contact a health  care provider if:  You cannot see any blood in the catheter when you flush it before using it.  Your catheter is difficult to flush. Get help right away if:  You cannot flush the catheter.  The catheter leaks when you flush it or when there is fluid in it.  There are cracks or breaks in the catheter. Summary  It is important to flush your tunneled central venous catheter each time you use it, both before and after you use it.  Scrub the injection cap for 15 seconds with a disinfecting wipe before and after you flush it.  When you flush your catheter, make sure you follow any specific instructions from your health care provider or the manufacturer.  Get help right away if you cannot flush the catheter. This information is not intended to replace advice given to you by your health care provider. Make sure you discuss any questions you have with your health care provider. Document Revised: 01/03/2019 Document Reviewed: 06/26/2018 Elsevier Patient Education  2020 [employer]  

## 2019-08-04 ENCOUNTER — Other Ambulatory Visit: Payer: Self-pay | Admitting: Family Medicine

## 2019-08-04 NOTE — Telephone Encounter (Signed)
Ok for wheelchair?  

## 2019-08-04 NOTE — Telephone Encounter (Signed)
Patient daughter is calling back to check on this. Please advise.

## 2019-08-12 NOTE — Progress Notes (Signed)
Pharmacist Chemotherapy Monitoring - Follow Up Assessment    I verify that I have reviewed each item in the below checklist:  . Regimen for the patient is scheduled for the appropriate day and plan matches scheduled date. Marland Kitchen Appropriate non-routine labs are ordered dependent on drug ordered. . If applicable, additional medications reviewed and ordered per protocol based on lifetime cumulative doses and/or treatment regimen.   Plan for follow-up and/or issues identified: No . I-vent associated with next due treatment: No . MD and/or nursing notified: No  Sheana Bir, Jacqlyn Larsen 08/12/2019 2:03 PM

## 2019-08-14 ENCOUNTER — Telehealth: Payer: Self-pay | Admitting: Family Medicine

## 2019-08-14 ENCOUNTER — Other Ambulatory Visit: Payer: Self-pay | Admitting: Emergency Medicine

## 2019-08-14 DIAGNOSIS — I10 Essential (primary) hypertension: Secondary | ICD-10-CM

## 2019-08-14 MED ORDER — OLMESARTAN MEDOXOMIL 20 MG PO TABS: 20.0000 mg | ORAL_TABLET | Freq: Every day | ORAL | 1 refills | Status: DC

## 2019-08-14 NOTE — Telephone Encounter (Signed)
Rx sent 

## 2019-08-14 NOTE — Telephone Encounter (Signed)
Patient daughter is calling and requesting a refill on the following medication for a 90 day supply since it is mail order.  olmesartan (BENICAR) 20 MG tablet   [place], [place] Phone:  [phone]  Fax:  [phone]

## 2019-08-18 NOTE — Telephone Encounter (Signed)
Pt and pt daughter called in today and requesting order for wheelchair to be faxed to [facility] in [place]. Spoke with Shirlee Limerick at [phone] and Faxed order to [phone] provided by Shirlee Limerick. Pt wanted a copy of order faxed to herself. Faxed to [phone] per pt request.

## 2019-08-19 ENCOUNTER — Inpatient Hospital Stay: Payer: Medicare HMO

## 2019-08-19 ENCOUNTER — Other Ambulatory Visit: Payer: Self-pay

## 2019-08-19 ENCOUNTER — Inpatient Hospital Stay (HOSPITAL_BASED_OUTPATIENT_CLINIC_OR_DEPARTMENT_OTHER): Payer: Medicare HMO | Admitting: Hematology & Oncology

## 2019-08-19 ENCOUNTER — Encounter: Payer: Self-pay | Admitting: Hematology & Oncology

## 2019-08-19 VITALS — BP 141/80 | HR 76 | Temp 97.6°F | Resp 18 | Wt 179.0 lb

## 2019-08-19 DIAGNOSIS — C189 Malignant neoplasm of colon, unspecified: Secondary | ICD-10-CM

## 2019-08-19 DIAGNOSIS — Z7952 Long term (current) use of systemic steroids: Secondary | ICD-10-CM | POA: Diagnosis not present

## 2019-08-19 DIAGNOSIS — C787 Secondary malignant neoplasm of liver and intrahepatic bile duct: Secondary | ICD-10-CM

## 2019-08-19 DIAGNOSIS — Z794 Long term (current) use of insulin: Secondary | ICD-10-CM | POA: Diagnosis not present

## 2019-08-19 DIAGNOSIS — C182 Malignant neoplasm of ascending colon: Secondary | ICD-10-CM | POA: Diagnosis not present

## 2019-08-19 DIAGNOSIS — Z8673 Personal history of transient ischemic attack (TIA), and cerebral infarction without residual deficits: Secondary | ICD-10-CM | POA: Diagnosis not present

## 2019-08-19 DIAGNOSIS — D509 Iron deficiency anemia, unspecified: Secondary | ICD-10-CM | POA: Diagnosis not present

## 2019-08-19 DIAGNOSIS — D500 Iron deficiency anemia secondary to blood loss (chronic): Secondary | ICD-10-CM

## 2019-08-19 DIAGNOSIS — Z5111 Encounter for antineoplastic chemotherapy: Secondary | ICD-10-CM | POA: Diagnosis not present

## 2019-08-19 DIAGNOSIS — K2970 Gastritis, unspecified, without bleeding: Secondary | ICD-10-CM | POA: Diagnosis not present

## 2019-08-19 DIAGNOSIS — Z5112 Encounter for antineoplastic immunotherapy: Secondary | ICD-10-CM | POA: Diagnosis not present

## 2019-08-19 DIAGNOSIS — K5900 Constipation, unspecified: Secondary | ICD-10-CM | POA: Diagnosis not present

## 2019-08-19 LAB — CMP (CANCER CENTER ONLY)
ALT: 14 U/L (ref 0–44)
AST: 15 U/L (ref 15–41)
Albumin: 3.7 g/dL (ref 3.5–5.0)
Alkaline Phosphatase: 74 U/L (ref 38–126)
Anion gap: 6 (ref 5–15)
BUN: 14 mg/dL (ref 8–23)
CO2: 29 mmol/L (ref 22–32)
Calcium: 9.7 mg/dL (ref 8.9–10.3)
Chloride: 106 mmol/L (ref 98–111)
Creatinine: 0.77 mg/dL (ref 0.44–1.00)
GFR, Est AFR Am: >60 mL/min (ref >60)
GFR, Estimated: >60 mL/min (ref >60)
Glucose, Bld: 222 mg/dL — ABNORMAL HIGH (ref 70–99)
Potassium: 4 mmol/L (ref 3.5–5.1)
Sodium: 141 mmol/L (ref 135–145)
Total Bilirubin: 0.7 mg/dL (ref 0.3–1.2)
Total Protein: 6 g/dL — ABNORMAL LOW (ref 6.5–8.1)

## 2019-08-19 LAB — CBC WITH DIFFERENTIAL (CANCER CENTER ONLY)
Abs Immature Granulocytes: 0.02 10*3/uL (ref 0.00–0.07)
Basophils Absolute: 0 10*3/uL (ref 0.0–0.1)
Basophils Relative: 1 %
Eosinophils Absolute: 0 10*3/uL (ref 0.0–0.5)
Eosinophils Relative: 0 %
HCT: 37.7 % (ref 36.0–46.0)
Hemoglobin: 11.6 g/dL — ABNORMAL LOW (ref 12.0–15.0)
Immature Granulocytes: 0 %
Lymphocytes Relative: 26 %
Lymphs Abs: 1.3 10*3/uL (ref 0.7–4.0)
MCH: 27.2 pg (ref 26.0–34.0)
MCHC: 30.8 g/dL (ref 30.0–36.0)
MCV: 88.3 fL (ref 80.0–100.0)
Monocytes Absolute: 0.6 10*3/uL (ref 0.1–1.0)
Monocytes Relative: 12 %
Neutro Abs: 3.2 10*3/uL (ref 1.7–7.7)
Neutrophils Relative %: 61 %
Platelet Count: 190 10*3/uL (ref 150–400)
RBC: 4.27 MIL/uL (ref 3.87–5.11)
RDW: 15.7 % — ABNORMAL HIGH (ref 11.5–15.5)
WBC Count: 5.1 10*3/uL (ref 4.0–10.5)
nRBC: 0 % (ref 0.0–0.2)

## 2019-08-19 MED ORDER — SODIUM CHLORIDE 0.9 % IV SOLN
5.0000 mg/kg | Freq: Once | INTRAVENOUS | Status: AC
  Administered 2019-08-19: 400 mg via INTRAVENOUS
  Filled 2019-08-19: qty 16

## 2019-08-19 MED ORDER — SODIUM CHLORIDE 0.9 % IV SOLN: 135.0000 mg/m2 | Freq: Once | INTRAVENOUS | Status: DC

## 2019-08-19 MED ORDER — ATROPINE SULFATE 1 MG/ML IJ SOLN
0.5000 mg | Freq: Once | INTRAMUSCULAR | Status: AC | PRN
  Administered 2019-08-19: 0.5 mg via INTRAVENOUS

## 2019-08-19 MED ORDER — FLUOROURACIL CHEMO INJECTION 2.5 GM/50ML
320.0000 mg/m2 | Freq: Once | INTRAVENOUS | Status: AC
  Administered 2019-08-19: 650 mg via INTRAVENOUS
  Filled 2019-08-19: qty 13

## 2019-08-19 MED ORDER — SODIUM CHLORIDE 0.9 % IV SOLN
Freq: Once | INTRAVENOUS | Status: AC
  Filled 2019-08-19: qty 250

## 2019-08-19 MED ORDER — FAMOTIDINE 40 MG PO TABS: 40.0000 mg | ORAL_TABLET | Freq: Two times a day (BID) | ORAL | 3 refills | Status: DC

## 2019-08-19 MED ORDER — PALONOSETRON HCL INJECTION 0.25 MG/5ML
INTRAVENOUS | Status: AC
  Filled 2019-08-19: qty 5

## 2019-08-19 MED ORDER — SODIUM CHLORIDE 0.9 % IV SOLN
135.0000 mg/m2 | Freq: Once | INTRAVENOUS | Status: AC
  Administered 2019-08-19: 260 mg via INTRAVENOUS
  Filled 2019-08-19: qty 8

## 2019-08-19 MED ORDER — SODIUM CHLORIDE 0.9 % IV SOLN
400.0000 mg/m2 | Freq: Once | INTRAVENOUS | Status: AC
  Administered 2019-08-19: 784 mg via INTRAVENOUS
  Filled 2019-08-19: qty 39.2

## 2019-08-19 MED ORDER — SODIUM CHLORIDE 0.9 % IV SOLN
10.0000 mg | Freq: Once | INTRAVENOUS | Status: AC
  Administered 2019-08-19: 10 mg via INTRAVENOUS
  Filled 2019-08-19: qty 1

## 2019-08-19 MED ORDER — SODIUM CHLORIDE 0.9 % IV SOLN
1920.0000 mg/m2 | INTRAVENOUS | Status: DC
  Administered 2019-08-19: 3750 mg via INTRAVENOUS
  Filled 2019-08-19: qty 75

## 2019-08-19 MED ORDER — ATROPINE SULFATE 1 MG/ML IJ SOLN
INTRAMUSCULAR | Status: AC
  Filled 2019-08-19: qty 1

## 2019-08-19 MED ORDER — PALONOSETRON HCL INJECTION 0.25 MG/5ML
0.2500 mg | Freq: Once | INTRAVENOUS | Status: AC
  Administered 2019-08-19: 0.25 mg via INTRAVENOUS

## 2019-08-19 NOTE — Progress Notes (Signed)
Hematology and Oncology Follow Up Visit  Tonya [name] [id number] [birth year] 79 y.o. 08/19/2019   Principle Diagnosis:  Stage IV adenocarcinoma of the ascending colon -- NRAS+, BRAF-, HER2-, MSI/MMR -,  Iron deficiency anemia  Past Therapy: FOLFOX - s/p cycle 4  Current Therapy:        FOLFIRI/Bevacizumab -started 01/20/2019,s/p cycle #10 IV Iron as indicated   Interim History:  Tonya [name] is here today for follow-up and treatment.  Overall, she seems to be doing pretty well.  Her biggest complaint has been some dizziness.  I am not sure exactly why she has this.  It might be from the chemotherapy.  She says that when she takes losartan, she gets dizzy.  I told her that she can talk to her family doctor and maybe the losartan can be changed to something different.  She has had a good blood pressure control.  I know that with Avastin, her blood pressure can be high and this may cause dizziness.  So far, I do not think this has been a problem.  There has been no problems with nausea or vomiting.  She does have a little bit of dyspepsia.  I will call in some Pepcid for her.  I will have her take 40 mg p.o. twice daily.  Her last CEA level back in March was 1.32.  She has had no bleeding.  She has had the stroke in the past.  She has chronic weakness on her right side.  Her blood sugar is little bit high today.  We will have to be cautious with this.  Her last iron studies did not show any iron deficiency.  Overall, her performance status is ECOG 1.    Medications:  Allergies as of 08/19/2019      Reactions   Hydrocodone Swelling, Other (See Comments)   "I started swelling, became red, and passed out"   Iohexol Hives, Shortness Of Breath, Other (See Comments)   Patient developed hives and fullness in throat post injection of 125cc's Omni 300, Onset Date: 11/15/2006   Naproxen Other (See Comments)   "It made me feel out of my head"   Ibuprofen Nausea Only   Propoxyphene  N-acetaminophen Other (See Comments)   "I couldn't find the door to make my way out of the room- I was in misery"      Medication List       Accurate as of August 19, 2019 11:48 AM. If you have any questions, ask your nurse or doctor.        amLODipine 10 MG tablet Commonly known as: NORVASC Take 1 tablet (10 mg total) by mouth daily.   aspirin EC 81 MG tablet Take 81 mg by mouth daily.   atorvastatin 10 MG tablet Commonly known as: LIPITOR Take 1 tablet (10 mg total) by mouth daily.   B-D SINGLE USE SWABS REGULAR Pads 1 each by Does not apply route as needed (to cleanse site prior to obtaining droplet of blood for CBG's). E11.9   Cholecalciferol 50 MCG (2000 UT) Tabs Take 1 tablet (2,000 Units total) by mouth daily.   dexamethasone 4 MG tablet Commonly known as: DECADRON Take 2 tablets (8 mg total) by mouth 2 (two) times daily. For four days following chemotherapy.   dronabinol 2.5 MG capsule Commonly known as: MARINOL Take 1 capsule (2.5 mg total) by mouth 2 (two) times daily before a meal.   lidocaine 2 % solution Commonly known as: XYLOCAINE   lidocaine-prilocaine cream  Commonly known as: EMLA   loperamide 2 MG tablet Commonly known as: Imodium A-D Take 2 tablets (4 mg total) by mouth 4 (four) times daily as needed. Take 2 at diarrhea onset , then 1 every 2hr until 12hrs with no BM. May take 2 every 4hrs at night. If diarrhea recurs repeat.   losartan 50 MG tablet Commonly known as: COZAAR TAKE 1 TABLET BY MOUTH IN THE MORNING   magic mouthwash Soln Swish and swallow equal parts Benadryl, Lidocaine and Nystatin.  5- 10 ml.'s four times a day as needed.   meclizine 12.5 MG tablet Commonly known as: ANTIVERT TAKE 1 TABLET(12.5 MG) BY MOUTH THREE TIMES DAILY AS NEEDED FOR DIZZINESS   metoprolol tartrate 25 MG tablet Commonly known as: LOPRESSOR TAKE 1 TABLET BY MOUTH TWICE A DAY   olmesartan 20 MG tablet Commonly known as: BENICAR Take 1 tablet (20 mg  total) by mouth daily.   Pen Needles 32G X 4 MM Misc 1 each by Does not apply route daily. E11.9   temazepam 7.5 MG capsule Commonly known as: Restoril Take 1 capsule (7.5 mg total) by mouth at bedtime as needed for sleep.   traMADol 50 MG tablet Commonly known as: ULTRAM Take 1 tablet (50 mg total) by mouth every 6 (six) hours as needed.   Tyler Aas FlexTouch 100 UNIT/ML FlexTouch Pen Generic drug: insulin degludec Inject 0.2 mLs (20 Units total) into the skin daily.   True Metrix Air Glucose Meter Devi 1 each by Does not apply route 2 (two) times daily. E11.9   True Metrix Blood Glucose Test test strip Generic drug: glucose blood 1 each by Other route 2 (two) times daily. E11.9   True Metrix Level 1 Low Soln 1 each by Other route as needed (to calibrate glucometer). E11.9   TRUEplus Lancets 30G Misc 1 each by Does not apply route 2 (two) times daily. E11.9       Allergies:  Allergies  Allergen Reactions  . Hydrocodone Swelling and Other (See Comments)    "I started swelling, became red, and passed out"  . Iohexol Hives, Shortness Of Breath and Other (See Comments)    Patient developed hives and fullness in throat post injection of 125cc's Omni 300, Onset Date: 11/15/2006   . Naproxen Other (See Comments)    "It made me feel out of my head"  . Ibuprofen Nausea Only  . Propoxyphene N-Acetaminophen Other (See Comments)    "I couldn't find the door to make my way out of the room- I was in misery"    Past Medical History, Surgical history, Social history, and Family History were reviewed and updated.  Review of Systems: Review of Systems  Constitutional: Negative.   HENT: Negative.   Eyes: Negative.   Respiratory: Negative.   Cardiovascular: Negative.   Gastrointestinal: Positive for nausea.  Genitourinary: Negative.   Musculoskeletal: Negative.   Skin: Negative.   Neurological: Positive for dizziness and focal weakness.  Endo/Heme/Allergies: Negative.    Psychiatric/Behavioral: Negative.      Physical Exam:  weight is 179 lb (81.2 kg). Her temporal temperature is 97.6 F (36.4 C). Her blood pressure is 141/80 (abnormal) and her pulse is 76. Her respiration is 18 and oxygen saturation is 100%.   Wt Readings from Last 3 Encounters:  08/19/19 179 lb (81.2 kg)  07/30/19 180 lb (81.6 kg)  07/17/19 179 lb (81.2 kg)    Physical Exam Vitals reviewed.  HENT:     Head: Normocephalic and atraumatic.  Eyes:     Pupils: Pupils are equal, round, and reactive to light.  Cardiovascular:     Rate and Rhythm: Normal rate and regular rhythm.     Heart sounds: Normal heart sounds.  Pulmonary:     Effort: Pulmonary effort is normal.     Breath sounds: Normal breath sounds.  Abdominal:     General: Bowel sounds are normal.     Palpations: Abdomen is soft.  Musculoskeletal:        General: No tenderness or deformity. Normal range of motion.     Cervical back: Normal range of motion.  Lymphadenopathy:     Cervical: No cervical adenopathy.  Skin:    General: Skin is warm and dry.     Findings: No erythema or rash.  Neurological:     Mental Status: She is alert and oriented to person, place, and time.     Comments: She has chronic weakness over on the right side from the past stroke.  Psychiatric:        Behavior: Behavior normal.        Thought Content: Thought content normal.        Judgment: Judgment normal.      Lab Results  Component Value Date   WBC 5.1 08/19/2019   HGB 11.6 (L) 08/19/2019   HCT 37.7 08/19/2019   MCV 88.3 08/19/2019   PLT 190 08/19/2019   Lab Results  Component Value Date   FERRITIN 312 (H) 07/08/2019   IRON 63 07/08/2019   TIBC 271 07/08/2019   UIBC 209 07/08/2019   IRONPCTSAT 23 07/08/2019   Lab Results  Component Value Date   RETICCTPCT 1.5 05/17/2016   RBC 4.27 08/19/2019   RETICCTABS 62,400 05/17/2016   No results found for: KPAFRELGTCHN, LAMBDASER, KAPLAMBRATIO No results found for:  IGGSERUM, IGA, IGMSERUM No results found for: Odetta Pink, SPEI   Chemistry      Component Value Date/Time   NA 141 08/19/2019 1019   NA 139 09/30/2018 1311   K 4.0 08/19/2019 1019   CL 106 08/19/2019 1019   CO2 29 08/19/2019 1019   BUN 14 08/19/2019 1019   BUN 10 09/30/2018 1311   CREATININE 0.77 08/19/2019 1019      Component Value Date/Time   CALCIUM 9.7 08/19/2019 1019   ALKPHOS 74 08/19/2019 1019   AST 15 08/19/2019 1019   ALT 14 08/19/2019 1019   BILITOT 0.7 08/19/2019 1019       Impression and Plan: Ms. Fetterolf is a very pleasant 79 yo African American female with metastatic colon cancer.  Her blood counts look quite good.  I must say that the every 3-week protocol does work well.  We will likely plan for another PET scan when we see her back.  Her last scan was back in February.  I would have to think that given the normal CEA level, that her PET scan it should show that she is responding.  Hopefully, the Pepcid will help with the gastritis and dyspepsia.  We will plan to have her come back in 3 more weeks for follow-up.    [name], MD 4/27/202111:48 AM

## 2019-08-19 NOTE — Patient Instructions (Signed)
Implanted Port Insertion, Care After °This sheet gives you information about how to care for yourself after your procedure. Your health care provider may also give you more specific instructions. If you have problems or questions, contact your health care provider. °What can I expect after the procedure? °After the procedure, it is common to have: °· Discomfort at the port insertion site. °· Bruising on the skin over the port. This should improve over 3-4 days. °Follow these instructions at home: °Port care °· After your port is placed, you will get a manufacturer's information card. The card has information about your port. Keep this card with you at all times. °· Take care of the port as told by your health care provider. Ask your health care provider if you or a family member can get training for taking care of the port at home. A home health care nurse may also take care of the port. °· Make sure to remember what type of port you have. °Incision care ° °  ° °· Follow instructions from your health care provider about how to take care of your port insertion site. Make sure you: °? Wash your hands with soap and water before and after you change your bandage (dressing). If soap and water are not available, use hand sanitizer. °? Change your dressing as told by your health care provider. °? Leave stitches (sutures), skin glue, or adhesive strips in place. These skin closures may need to stay in place for 2 weeks or longer. If adhesive strip edges start to loosen and curl up, you may trim the loose edges. Do not remove adhesive strips completely unless your health care provider tells you to do that. °· Check your port insertion site every day for signs of infection. Check for: °? Redness, swelling, or pain. °? Fluid or blood. °? Warmth. °? Pus or a bad smell. °Activity °· Return to your normal activities as told by your health care provider. Ask your health care provider what activities are safe for you. °· Do not  lift anything that is heavier than 10 lb (4.5 kg), or the limit that you are told, until your health care provider says that it is safe. °General instructions °· Take over-the-counter and prescription medicines only as told by your health care provider. °· Do not take baths, swim, or use a hot tub until your health care provider approves. Ask your health care provider if you may take showers. You may only be allowed to take sponge baths. °· Do not drive for 24 hours if you were given a sedative during your procedure. °· Wear a medical alert bracelet in case of an emergency. This will tell any health care providers that you have a port. °· Keep all follow-up visits as told by your health care provider. This is important. °Contact a health care provider if: °· You cannot flush your port with saline as directed, or you cannot draw blood from the port. °· You have a fever or chills. °· You have redness, swelling, or pain around your port insertion site. °· You have fluid or blood coming from your port insertion site. °· Your port insertion site feels warm to the touch. °· You have pus or a bad smell coming from the port insertion site. °Get help right away if: °· You have chest pain or shortness of breath. °· You have bleeding from your port that you cannot control. °Summary °· Take care of the port as told by your health   care provider. Keep the manufacturer's information card with you at all times. °· Change your dressing as told by your health care provider. °· Contact a health care provider if you have a fever or chills or if you have redness, swelling, or pain around your port insertion site. °· Keep all follow-up visits as told by your health care provider. °This information is not intended to replace advice given to you by your health care provider. Make sure you discuss any questions you have with your health care provider. °Document Revised: 11/06/2017 Document Reviewed: 11/06/2017 °[employer]vier Patient Education ©  2020 [employer] ° °

## 2019-08-19 NOTE — Patient Instructions (Signed)
Fluorouracil, 5-FU injection What is this medicine? FLUOROURACIL, 5-FU (flure oh YOOR a sil) is a chemotherapy drug. It slows the growth of cancer cells. This medicine is used to treat many types of cancer like breast cancer, colon or rectal cancer, pancreatic cancer, and stomach cancer. This medicine may be used for other purposes; ask your health care provider or pharmacist if you have questions. COMMON BRAND NAME(S): Adrucil What should I tell my health care provider before I take this medicine? They need to know if you have any of these conditions:  blood disorders  dihydropyrimidine dehydrogenase (DPD) deficiency  infection (especially a virus infection such as chickenpox, cold sores, or herpes)  kidney disease  liver disease  malnourished, poor nutrition  recent or ongoing radiation therapy  an unusual or allergic reaction to fluorouracil, other chemotherapy, other medicines, foods, dyes, or preservatives  pregnant or trying to get pregnant  breast-feeding How should I use this medicine? This drug is given as an infusion or injection into a vein. It is administered in a hospital or clinic by a specially trained health care professional. Talk to your pediatrician regarding the use of this medicine in children. Special care may be needed. Overdosage: If you think you have taken too much of this medicine contact a poison control center or emergency room at once. NOTE: This medicine is only for you. Do not share this medicine with others. What if I miss a dose? It is important not to miss your dose. Call your doctor or health care professional if you are unable to keep an appointment. What may interact with this medicine?  allopurinol  cimetidine  dapsone  digoxin  hydroxyurea  leucovorin  levamisole  medicines for seizures like ethotoin, fosphenytoin, phenytoin  medicines to increase blood counts like filgrastim, pegfilgrastim, sargramostim  medicines that  treat or prevent blood clots like warfarin, enoxaparin, and dalteparin  methotrexate  metronidazole  pyrimethamine  some other chemotherapy drugs like busulfan, cisplatin, estramustine, vinblastine  trimethoprim  trimetrexate  vaccines Talk to your doctor or health care professional before taking any of these medicines:  acetaminophen  aspirin  ibuprofen  ketoprofen  naproxen This list may not describe all possible interactions. Give your health care provider a list of all the medicines, herbs, non-prescription drugs, or dietary supplements you use. Also tell them if you smoke, drink alcohol, or use illegal drugs. Some items may interact with your medicine. What should I watch for while using this medicine? Visit your doctor for checks on your progress. This drug may make you feel generally unwell. This is not uncommon, as chemotherapy can affect healthy cells as well as cancer cells. Report any side effects. Continue your course of treatment even though you feel ill unless your doctor tells you to stop. In some cases, you may be given additional medicines to help with side effects. Follow all directions for their use. Call your doctor or health care professional for advice if you get a fever, chills or sore throat, or other symptoms of a cold or flu. Do not treat yourself. This drug decreases your body's ability to fight infections. Try to avoid being around people who are sick. This medicine may increase your risk to bruise or bleed. Call your doctor or health care professional if you notice any unusual bleeding. Be careful brushing and flossing your teeth or using a toothpick because you may get an infection or bleed more easily. If you have any dental work done, tell your dentist you are  receiving this medicine. Avoid taking products that contain aspirin, acetaminophen, ibuprofen, naproxen, or ketoprofen unless instructed by your doctor. These medicines may hide a fever. Do not  become pregnant while taking this medicine. Women should inform their doctor if they wish to become pregnant or think they might be pregnant. There is a potential for serious side effects to an unborn child. Talk to your health care professional or pharmacist for more information. Do not breast-feed an infant while taking this medicine. Men should inform their doctor if they wish to father a child. This medicine may lower sperm counts. Do not treat diarrhea with over the counter products. Contact your doctor if you have diarrhea that lasts more than 2 days or if it is severe and watery. This medicine can make you more sensitive to the sun. Keep out of the sun. If you cannot avoid being in the sun, wear protective clothing and use sunscreen. Do not use sun lamps or tanning beds/booths. What side effects may I notice from receiving this medicine? Side effects that you should report to your doctor or health care professional as soon as possible:  allergic reactions like skin rash, itching or hives, swelling of the face, lips, or tongue  low blood counts - this medicine may decrease the number of white blood cells, red blood cells and platelets. You may be at increased risk for infections and bleeding.  signs of infection - fever or chills, cough, sore throat, pain or difficulty passing urine  signs of decreased platelets or bleeding - bruising, pinpoint red spots on the skin, black, tarry stools, blood in the urine  signs of decreased red blood cells - unusually weak or tired, fainting spells, lightheadedness  breathing problems  changes in vision  chest pain  mouth sores  nausea and vomiting  pain, swelling, redness at site where injected  pain, tingling, numbness in the hands or feet  redness, swelling, or sores on hands or feet  stomach pain  unusual bleeding Side effects that usually do not require medical attention (report to your doctor or health care professional if they  continue or are bothersome):  changes in finger or toe nails  diarrhea  dry or itchy skin  hair loss  headache  loss of appetite  sensitivity of eyes to the light  stomach upset  unusually teary eyes This list may not describe all possible side effects. Call your doctor for medical advice about side effects. You may report side effects to FDA at 1-800-FDA-1088. Where should I keep my medicine? This drug is given in a hospital or clinic and will not be stored at home. NOTE: This sheet is a summary. It may not cover all possible information. If you have questions about this medicine, talk to your doctor, pharmacist, or health care provider.  2020 Elsevier/Gold Standard (2007-08-14 13:53:16) Leucovorin injection What is this medicine? LEUCOVORIN (loo koe VOR in) is used to prevent or treat the harmful effects of some medicines. This medicine is used to treat anemia caused by a low amount of folic acid in the body. It is also used with 5-fluorouracil (5-FU) to treat colon cancer. This medicine may be used for other purposes; ask your health care provider or pharmacist if you have questions. What should I tell my health care provider before I take this medicine? They need to know if you have any of these conditions:  anemia from low levels of vitamin B-12 in the blood  an unusual or allergic reaction to  leucovorin, folic acid, other medicines, foods, dyes, or preservatives  pregnant or trying to get pregnant  breast-feeding How should I use this medicine? This medicine is for injection into a muscle or into a vein. It is given by a health care professional in a hospital or clinic setting. Talk to your pediatrician regarding the use of this medicine in children. Special care may be needed. Overdosage: If you think you have taken too much of this medicine contact a poison control center or emergency room at once. NOTE: This medicine is only for you. Do not share this medicine with  others. What if I miss a dose? This does not apply. What may interact with this medicine?  capecitabine  fluorouracil  phenobarbital  phenytoin  primidone  trimethoprim-sulfamethoxazole This list may not describe all possible interactions. Give your health care provider a list of all the medicines, herbs, non-prescription drugs, or dietary supplements you use. Also tell them if you smoke, drink alcohol, or use illegal drugs. Some items may interact with your medicine. What should I watch for while using this medicine? Your condition will be monitored carefully while you are receiving this medicine. This medicine may increase the side effects of 5-fluorouracil, 5-FU. Tell your doctor or health care professional if you have diarrhea or mouth sores that do not get better or that get worse. What side effects may I notice from receiving this medicine? Side effects that you should report to your doctor or health care professional as soon as possible:  allergic reactions like skin rash, itching or hives, swelling of the face, lips, or tongue  breathing problems  fever, infection  mouth sores  unusual bleeding or bruising  unusually weak or tired Side effects that usually do not require medical attention (report to your doctor or health care professional if they continue or are bothersome):  constipation or diarrhea  loss of appetite  nausea, vomiting This list may not describe all possible side effects. Call your doctor for medical advice about side effects. You may report side effects to FDA at 1-800-FDA-1088. Where should I keep my medicine? This drug is given in a hospital or clinic and will not be stored at home. NOTE: This sheet is a summary. It may not cover all possible information. If you have questions about this medicine, talk to your doctor, pharmacist, or health care provider.  2020 Elsevier/Gold Standard (2007-10-15 16:50:29) Irinotecan injection What is this  medicine? IRINOTECAN (ir in oh TEE kan ) is a chemotherapy drug. It is used to treat colon and rectal cancer. This medicine may be used for other purposes; ask your health care provider or pharmacist if you have questions. COMMON BRAND NAME(S): Camptosar What should I tell my health care provider before I take this medicine? They need to know if you have any of these conditions:  dehydration  diarrhea  infection (especially a virus infection such as chickenpox, cold sores, or herpes)  liver disease  low blood counts, like low white cell, platelet, or red cell counts  low levels of calcium, magnesium, or potassium in the blood  recent or ongoing radiation therapy  an unusual or allergic reaction to irinotecan, other medicines, foods, dyes, or preservatives  pregnant or trying to get pregnant  breast-feeding How should I use this medicine? This drug is given as an infusion into a vein. It is administered in a hospital or clinic by a specially trained health care professional. Talk to your pediatrician regarding the use of this medicine   in children. Special care may be needed. Overdosage: If you think you have taken too much of this medicine contact a poison control center or emergency room at once. NOTE: This medicine is only for you. Do not share this medicine with others. What if I miss a dose? It is important not to miss your dose. Call your doctor or health care professional if you are unable to keep an appointment. What may interact with this medicine? This medicine may interact with the following medications:  antiviral medicines for HIV or AIDS  certain antibiotics like rifampin or rifabutin  certain medicines for fungal infections like itraconazole, ketoconazole, posaconazole, and voriconazole  certain medicines for seizures like carbamazepine, phenobarbital, phenotoin  clarithromycin  gemfibrozil  nefazodone  St. John's Wort This list may not describe all  possible interactions. Give your health care provider a list of all the medicines, herbs, non-prescription drugs, or dietary supplements you use. Also tell them if you smoke, drink alcohol, or use illegal drugs. Some items may interact with your medicine. What should I watch for while using this medicine? Your condition will be monitored carefully while you are receiving this medicine. You will need important blood work done while you are taking this medicine. This drug may make you feel generally unwell. This is not uncommon, as chemotherapy can affect healthy cells as well as cancer cells. Report any side effects. Continue your course of treatment even though you feel ill unless your doctor tells you to stop. In some cases, you may be given additional medicines to help with side effects. Follow all directions for their use. You may get drowsy or dizzy. Do not drive, use machinery, or do anything that needs mental alertness until you know how this medicine affects you. Do not stand or sit up quickly, especially if you are an older patient. This reduces the risk of dizzy or fainting spells. Call your health care professional for advice if you get a fever, chills, or sore throat, or other symptoms of a cold or flu. Do not treat yourself. This medicine decreases your body's ability to fight infections. Try to avoid being around people who are sick. Avoid taking products that contain aspirin, acetaminophen, ibuprofen, naproxen, or ketoprofen unless instructed by your doctor. These medicines may hide a fever. This medicine may increase your risk to bruise or bleed. Call your doctor or health care professional if you notice any unusual bleeding. Be careful brushing and flossing your teeth or using a toothpick because you may get an infection or bleed more easily. If you have any dental work done, tell your dentist you are receiving this medicine. Do not become pregnant while taking this medicine or for 6 months  after stopping it. Women should inform their health care professional if they wish to become pregnant or think they might be pregnant. Men should not father a child while taking this medicine and for 3 months after stopping it. There is potential for serious side effects to an unborn child. Talk to your health care professional for more information. Do not breast-feed an infant while taking this medicine or for 7 days after stopping it. This medicine has caused ovarian failure in some women. This medicine may make it more difficult to get pregnant. Talk to your health care professional if you are concerned about your fertility. This medicine has caused decreased sperm counts in some men. This may make it more difficult to father a child. Talk to your health care professional if you   are concerned about your fertility. What side effects may I notice from receiving this medicine? Side effects that you should report to your doctor or health care professional as soon as possible:  allergic reactions like skin rash, itching or hives, swelling of the face, lips, or tongue  chest pain  diarrhea  flushing, runny nose, sweating during infusion  low blood counts - this medicine may decrease the number of white blood cells, red blood cells and platelets. You may be at increased risk for infections and bleeding.  nausea, vomiting  pain, swelling, warmth in the leg  signs of decreased platelets or bleeding - bruising, pinpoint red spots on the skin, black, tarry stools, blood in the urine  signs of infection - fever or chills, cough, sore throat, pain or difficulty passing urine  signs of decreased red blood cells - unusually weak or tired, fainting spells, lightheadedness Side effects that usually do not require medical attention (report to your doctor or health care professional if they continue or are bothersome):  constipation  hair loss  headache  loss of appetite  mouth sores  stomach  pain This list may not describe all possible side effects. Call your doctor for medical advice about side effects. You may report side effects to FDA at 1-800-FDA-1088. Where should I keep my medicine? This drug is given in a hospital or clinic and will not be stored at home. NOTE: This sheet is a summary. It may not cover all possible information. If you have questions about this medicine, talk to your doctor, pharmacist, or health care provider.  2020 Elsevier/Gold Standard (2018-05-31 10:09:17) Bevacizumab injection What is this medicine? BEVACIZUMAB (be va SIZ yoo mab) is a monoclonal antibody. It is used to treat many types of cancer. This medicine may be used for other purposes; ask your health care provider or pharmacist if you have questions. COMMON BRAND NAME(S): Avastin, MVASI, Zirabev What should I tell my health care provider before I take this medicine? They need to know if you have any of these conditions:  diabetes  heart disease  high blood pressure  history of coughing up blood  prior anthracycline chemotherapy (e.g., doxorubicin, daunorubicin, epirubicin)  recent or ongoing radiation therapy  recent or planning to have surgery  stroke  an unusual or allergic reaction to bevacizumab, hamster proteins, mouse proteins, other medicines, foods, dyes, or preservatives  pregnant or trying to get pregnant  breast-feeding How should I use this medicine? This medicine is for infusion into a vein. It is given by a health care professional in a hospital or clinic setting. Talk to your pediatrician regarding the use of this medicine in children. Special care may be needed. Overdosage: If you think you have taken too much of this medicine contact a poison control center or emergency room at once. NOTE: This medicine is only for you. Do not share this medicine with others. What if I miss a dose? It is important not to miss your dose. Call your doctor or health care  professional if you are unable to keep an appointment. What may interact with this medicine? Interactions are not expected. This list may not describe all possible interactions. Give your health care provider a list of all the medicines, herbs, non-prescription drugs, or dietary supplements you use. Also tell them if you smoke, drink alcohol, or use illegal drugs. Some items may interact with your medicine. What should I watch for while using this medicine? Your condition will be monitored carefully while  you are receiving this medicine. You will need important blood work and urine testing done while you are taking this medicine. This medicine may increase your risk to bruise or bleed. Call your doctor or health care professional if you notice any unusual bleeding. Before having surgery, talk to your health care provider to make sure it is ok. This drug can increase the risk of poor healing of your surgical site or wound. You will need to stop this drug for 28 days before surgery. After surgery, wait at least 28 days before restarting this drug. Make sure the surgical site or wound is healed enough before restarting this drug. Talk to your health care provider if questions. Do not become pregnant while taking this medicine or for 6 months after stopping it. Women should inform their doctor if they wish to become pregnant or think they might be pregnant. There is a potential for serious side effects to an unborn child. Talk to your health care professional or pharmacist for more information. Do not breast-feed an infant while taking this medicine and for 6 months after the last dose. This medicine has caused ovarian failure in some women. This medicine may interfere with the ability to have a child. You should talk to your doctor or health care professional if you are concerned about your fertility. What side effects may I notice from receiving this medicine? Side effects that you should report to your  doctor or health care professional as soon as possible:  allergic reactions like skin rash, itching or hives, swelling of the face, lips, or tongue  chest pain or chest tightness  chills  coughing up blood  high fever  seizures  severe constipation  signs and symptoms of bleeding such as bloody or black, tarry stools; red or dark-brown urine; spitting up blood or brown material that looks like coffee grounds; red spots on the skin; unusual bruising or bleeding from the eye, gums, or nose  signs and symptoms of a blood clot such as breathing problems; chest pain; severe, sudden headache; pain, swelling, warmth in the leg  signs and symptoms of a stroke like changes in vision; confusion; trouble speaking or understanding; severe headaches; sudden numbness or weakness of the face, arm or leg; trouble walking; dizziness; loss of balance or coordination  stomach pain  sweating  swelling of legs or ankles  vomiting  weight gain Side effects that usually do not require medical attention (report to your doctor or health care professional if they continue or are bothersome):  back pain  changes in taste  decreased appetite  dry skin  nausea  tiredness This list may not describe all possible side effects. Call your doctor for medical advice about side effects. You may report side effects to FDA at 1-800-FDA-1088. Where should I keep my medicine? This drug is given in a hospital or clinic and will not be stored at home. NOTE: This sheet is a summary. It may not cover all possible information. If you have questions about this medicine, talk to your doctor, pharmacist, or health care provider.  2020 Elsevier/Gold Standard (2019-02-05 10:50:46)

## 2019-08-19 NOTE — Telephone Encounter (Signed)
Not sure what pico certified means-can you help this pt

## 2019-08-19 NOTE — Telephone Encounter (Signed)
FYI

## 2019-08-19 NOTE — Telephone Encounter (Signed)
Tonya [name] called from [place] and states that they received the wheelchair order but when running Dr. Holly Bodily NPI number she is not pico certified, and she has to be pico certified in order for them to run the order through insurance. Tonya [name] said she would reach out to the patient and let her know.  I am not sure if you know of somewhere else this can be sent to.

## 2019-08-20 ENCOUNTER — Other Ambulatory Visit: Payer: Self-pay | Admitting: Emergency Medicine

## 2019-08-20 ENCOUNTER — Telehealth: Payer: Self-pay | Admitting: Family Medicine

## 2019-08-20 LAB — IRON AND TIBC
Iron: 46 ug/dL (ref 41–142)
Saturation Ratios: 16 % — ABNORMAL LOW (ref 21–57)
TIBC: 288 ug/dL (ref 236–444)
UIBC: 241 ug/dL (ref 120–384)

## 2019-08-20 LAB — CEA (IN HOUSE-CHCC): CEA (CHCC-In House): 3.48 ng/mL (ref 0.00–5.00)

## 2019-08-20 LAB — FERRITIN: Ferritin: 183 ng/mL (ref 11–307)

## 2019-08-20 NOTE — Telephone Encounter (Signed)
Please advise 

## 2019-08-20 NOTE — Telephone Encounter (Signed)
Patient daughter [name] is calling and states that her mother was put on olmesartan (BENICAR) 20 MG tablet  She states that she was told to take it in the mornings and after patient takes it she is very dizzy. She would like to know the alternatives.  [phone]

## 2019-08-20 NOTE — Telephone Encounter (Signed)
Pt daughter called in and states they did not receive a letter in the mail about Dr. Holly Bodily leaving. I confirmed the patient address and mailed another letter today.

## 2019-08-20 NOTE — Telephone Encounter (Signed)
Check blood pressure, pulse rate and glucose in the morning before medications and breakfast. Take blood pressure and pulse afterwards at time of symptoms onset

## 2019-08-20 NOTE — Telephone Encounter (Signed)
Stop losartan Continue benicar Dizziness likely related to taking BOTH medication Pt states she wanted to switch from Benicar to Losartan but is taking BOTH medications

## 2019-08-20 NOTE — Telephone Encounter (Signed)
Is patient suppose to continue on the med or stop the meds along with ur msg

## 2019-08-20 NOTE — Telephone Encounter (Signed)
Pt daughter called that is out of town to let us know that [facility] had reached out to them to informed them that she was no Designer, fashion/clothing. She said she has checked in [place] and does not know of anywhere else that provides this but will call the office if they can find somewhere.    Informed her that my manager could hopefully help [place].

## 2019-08-21 ENCOUNTER — Inpatient Hospital Stay: Payer: Medicare HMO

## 2019-08-21 ENCOUNTER — Other Ambulatory Visit: Payer: Self-pay

## 2019-08-21 VITALS — BP 160/81 | HR 73 | Temp 97.1°F | Resp 18

## 2019-08-21 DIAGNOSIS — K5900 Constipation, unspecified: Secondary | ICD-10-CM | POA: Diagnosis not present

## 2019-08-21 DIAGNOSIS — Z5111 Encounter for antineoplastic chemotherapy: Secondary | ICD-10-CM | POA: Diagnosis not present

## 2019-08-21 DIAGNOSIS — C189 Malignant neoplasm of colon, unspecified: Secondary | ICD-10-CM

## 2019-08-21 DIAGNOSIS — K2970 Gastritis, unspecified, without bleeding: Secondary | ICD-10-CM | POA: Diagnosis not present

## 2019-08-21 DIAGNOSIS — Z7952 Long term (current) use of systemic steroids: Secondary | ICD-10-CM | POA: Diagnosis not present

## 2019-08-21 DIAGNOSIS — Z794 Long term (current) use of insulin: Secondary | ICD-10-CM | POA: Diagnosis not present

## 2019-08-21 DIAGNOSIS — C787 Secondary malignant neoplasm of liver and intrahepatic bile duct: Secondary | ICD-10-CM

## 2019-08-21 DIAGNOSIS — Z8673 Personal history of transient ischemic attack (TIA), and cerebral infarction without residual deficits: Secondary | ICD-10-CM | POA: Diagnosis not present

## 2019-08-21 DIAGNOSIS — C182 Malignant neoplasm of ascending colon: Secondary | ICD-10-CM | POA: Diagnosis not present

## 2019-08-21 DIAGNOSIS — Z5112 Encounter for antineoplastic immunotherapy: Secondary | ICD-10-CM | POA: Diagnosis not present

## 2019-08-21 DIAGNOSIS — D509 Iron deficiency anemia, unspecified: Secondary | ICD-10-CM | POA: Diagnosis not present

## 2019-08-21 MED ORDER — COLD PACK MISC ONCOLOGY
1.0000 | Freq: Once | Status: DC | PRN
  Filled 2019-08-21: qty 1

## 2019-08-21 MED ORDER — HEPARIN SOD (PORK) LOCK FLUSH 100 UNIT/ML IV SOLN
500.0000 [IU] | Freq: Once | INTRAVENOUS | Status: AC | PRN
  Administered 2019-08-21: 14:00:00 500 [IU]
  Filled 2019-08-21: qty 5

## 2019-08-21 MED ORDER — SODIUM CHLORIDE 0.9% FLUSH
10.0000 mL | INTRAVENOUS | Status: DC | PRN
  Administered 2019-08-21: 10 mL
  Filled 2019-08-21: qty 10

## 2019-08-21 NOTE — Telephone Encounter (Signed)
Patient was informed.

## 2019-08-22 DIAGNOSIS — E1351 Other specified diabetes mellitus with diabetic peripheral angiopathy without gangrene: Secondary | ICD-10-CM | POA: Diagnosis not present

## 2019-08-22 DIAGNOSIS — L602 Onychogryphosis: Secondary | ICD-10-CM | POA: Diagnosis not present

## 2019-08-28 NOTE — Telephone Encounter (Signed)
Spoke with Tonya [name], she informed me she has reached out to the patient and patient has found a wheelchair.

## 2019-08-29 DIAGNOSIS — C187 Malignant neoplasm of sigmoid colon: Secondary | ICD-10-CM | POA: Diagnosis not present

## 2019-09-01 ENCOUNTER — Other Ambulatory Visit: Payer: Self-pay

## 2019-09-01 ENCOUNTER — Telehealth: Payer: Self-pay | Admitting: Endocrinology

## 2019-09-01 DIAGNOSIS — I493 Ventricular premature depolarization: Secondary | ICD-10-CM

## 2019-09-01 DIAGNOSIS — E785 Hyperlipidemia, unspecified: Secondary | ICD-10-CM

## 2019-09-01 DIAGNOSIS — I10 Essential (primary) hypertension: Secondary | ICD-10-CM

## 2019-09-01 MED ORDER — AMLODIPINE BESYLATE 10 MG PO TABS: 10.0000 mg | ORAL_TABLET | Freq: Every day | ORAL | 0 refills | Status: DC

## 2019-09-01 MED ORDER — METOPROLOL TARTRATE 25 MG PO TABS: 25.0000 mg | ORAL_TABLET | Freq: Two times a day (BID) | ORAL | 0 refills | Status: DC

## 2019-09-01 MED ORDER — ATORVASTATIN CALCIUM 10 MG PO TABS: 10.0000 mg | ORAL_TABLET | Freq: Every day | ORAL | 1 refills | Status: DC

## 2019-09-01 NOTE — Telephone Encounter (Signed)
NA

## 2019-09-02 NOTE — Progress Notes (Signed)
Pharmacist Chemotherapy Monitoring - Follow Up Assessment    I verify that I have reviewed each item in the below checklist:  . Regimen for the patient is scheduled for the appropriate day and plan matches scheduled date. Marland Kitchen Appropriate non-routine labs are ordered dependent on drug ordered. . If applicable, additional medications reviewed and ordered per protocol based on lifetime cumulative doses and/or treatment regimen.   Plan for follow-up and/or issues identified: No . I-vent associated with next due treatment: No . MD and/or nursing notified: No  Tonya [name], Jacqlyn Larsen 09/02/2019 8:34 AM

## 2019-09-05 ENCOUNTER — Ambulatory Visit (HOSPITAL_COMMUNITY)
Admission: RE | Admit: 2019-09-05 | Discharge: 2019-09-05 | Disposition: A | Discharge status: Home / Self Care | Payer: Medicare HMO | Source: Ambulatory Visit | Attending: Hematology & Oncology | Admitting: Hematology & Oncology

## 2019-09-05 ENCOUNTER — Other Ambulatory Visit: Payer: Self-pay

## 2019-09-05 ENCOUNTER — Telehealth: Payer: Self-pay | Admitting: Endocrinology

## 2019-09-05 DIAGNOSIS — Z79899 Other long term (current) drug therapy: Secondary | ICD-10-CM | POA: Diagnosis not present

## 2019-09-05 DIAGNOSIS — C189 Malignant neoplasm of colon, unspecified: Secondary | ICD-10-CM | POA: Insufficient documentation

## 2019-09-05 DIAGNOSIS — C787 Secondary malignant neoplasm of liver and intrahepatic bile duct: Secondary | ICD-10-CM | POA: Diagnosis not present

## 2019-09-05 DIAGNOSIS — E1149 Type 2 diabetes mellitus with other diabetic neurological complication: Secondary | ICD-10-CM

## 2019-09-05 LAB — GLUCOSE, CAPILLARY: Glucose-Capillary: 90 mg/dL (ref 70–99)

## 2019-09-05 MED ORDER — FLUDEOXYGLUCOSE F - 18 (FDG) INJECTION
5.4000 | Freq: Once | INTRAVENOUS | Status: AC
  Administered 2019-09-05: 5.4 via INTRAVENOUS

## 2019-09-05 MED ORDER — PEN NEEDLES 32G X 4 MM MISC: 1.0000 | Freq: Every day | 0 refills | Status: DC

## 2019-09-05 NOTE — Telephone Encounter (Signed)
Outpatient Medication Detail   Disp Refills Start End   Insulin Pen Needle (PEN NEEDLES) 32G X 4 MM MISC 100 each 0 09/05/2019    Sig - Route: 1 each by Does not apply route daily. E11.9 - Does not apply   Sent to pharmacy as: Insulin Pen Needle (PEN NEEDLES) 32G X 4 MM Misc   E-Prescribing Status: Receipt confirmed by pharmacy (09/05/2019  4:36 PM EDT)

## 2019-09-05 NOTE — Telephone Encounter (Signed)
Medication Refill Request  Did you call your pharmacy and request this refill first? Yes  . If patient has not contacted pharmacy first, instruct them to do so for future refills.  . Remind them that contacting the pharmacy for their refill is the quickest method to get the refill.  . Refill policy also stated that it will take anywhere between 24-72 hours to receive the refill.    Name of medication? tresiba flextouch pen needles - 56mm 32g (ultra thin)  Is this a 90 day supply? 30 day  Name and location of pharmacy?  [address] [place], [place] [address] Phone: [phone]

## 2019-09-08 ENCOUNTER — Telehealth: Payer: Self-pay | Admitting: *Deleted

## 2019-09-08 NOTE — Telephone Encounter (Signed)
Notified pt of results. Pt verbalized understanding. No concerns at this time. 

## 2019-09-08 NOTE — Telephone Encounter (Signed)
-----   Message from [name], MD sent at 09/08/2019 12:44 PM EDT ----- Call - the cancer is stable!!  No new tumors are growing!!!  God is GREAT!!  Tonya [name]

## 2019-09-09 ENCOUNTER — Inpatient Hospital Stay: Payer: Medicare HMO | Attending: Hematology & Oncology

## 2019-09-09 ENCOUNTER — Inpatient Hospital Stay: Payer: Medicare HMO

## 2019-09-09 ENCOUNTER — Telehealth: Payer: Self-pay | Admitting: Hematology & Oncology

## 2019-09-09 ENCOUNTER — Encounter: Payer: Self-pay | Admitting: Hematology & Oncology

## 2019-09-09 ENCOUNTER — Other Ambulatory Visit: Payer: Self-pay | Admitting: *Deleted

## 2019-09-09 ENCOUNTER — Other Ambulatory Visit: Payer: Self-pay

## 2019-09-09 ENCOUNTER — Inpatient Hospital Stay (HOSPITAL_BASED_OUTPATIENT_CLINIC_OR_DEPARTMENT_OTHER): Payer: Medicare HMO | Admitting: Hematology & Oncology

## 2019-09-09 VITALS — BP 176/77 | HR 76 | Temp 97.1°F | Resp 19 | Wt 180.0 lb

## 2019-09-09 VITALS — BP 143/74 | HR 72

## 2019-09-09 DIAGNOSIS — D500 Iron deficiency anemia secondary to blood loss (chronic): Secondary | ICD-10-CM

## 2019-09-09 DIAGNOSIS — Z5112 Encounter for antineoplastic immunotherapy: Secondary | ICD-10-CM | POA: Diagnosis not present

## 2019-09-09 DIAGNOSIS — C187 Malignant neoplasm of sigmoid colon: Secondary | ICD-10-CM | POA: Diagnosis not present

## 2019-09-09 DIAGNOSIS — C787 Secondary malignant neoplasm of liver and intrahepatic bile duct: Secondary | ICD-10-CM

## 2019-09-09 DIAGNOSIS — Z7952 Long term (current) use of systemic steroids: Secondary | ICD-10-CM | POA: Diagnosis not present

## 2019-09-09 DIAGNOSIS — C189 Malignant neoplasm of colon, unspecified: Secondary | ICD-10-CM | POA: Diagnosis not present

## 2019-09-09 DIAGNOSIS — D509 Iron deficiency anemia, unspecified: Secondary | ICD-10-CM | POA: Insufficient documentation

## 2019-09-09 DIAGNOSIS — Z7982 Long term (current) use of aspirin: Secondary | ICD-10-CM | POA: Diagnosis not present

## 2019-09-09 DIAGNOSIS — C182 Malignant neoplasm of ascending colon: Secondary | ICD-10-CM | POA: Diagnosis not present

## 2019-09-09 DIAGNOSIS — Z5111 Encounter for antineoplastic chemotherapy: Secondary | ICD-10-CM | POA: Diagnosis not present

## 2019-09-09 DIAGNOSIS — Z8673 Personal history of transient ischemic attack (TIA), and cerebral infarction without residual deficits: Secondary | ICD-10-CM | POA: Diagnosis not present

## 2019-09-09 DIAGNOSIS — Z79899 Other long term (current) drug therapy: Secondary | ICD-10-CM | POA: Insufficient documentation

## 2019-09-09 LAB — CMP (CANCER CENTER ONLY)
ALT: 13 U/L (ref 0–44)
AST: 15 U/L (ref 15–41)
Albumin: 3.8 g/dL (ref 3.5–5.0)
Alkaline Phosphatase: 78 U/L (ref 38–126)
Anion gap: 8 (ref 5–15)
BUN: 11 mg/dL (ref 8–23)
CO2: 29 mmol/L (ref 22–32)
Calcium: 9.7 mg/dL (ref 8.9–10.3)
Chloride: 106 mmol/L (ref 98–111)
Creatinine: 0.77 mg/dL (ref 0.44–1.00)
GFR, Est AFR Am: >60 mL/min (ref >60)
GFR, Estimated: >60 mL/min (ref >60)
Glucose, Bld: 195 mg/dL — ABNORMAL HIGH (ref 70–99)
Potassium: 3.9 mmol/L (ref 3.5–5.1)
Sodium: 143 mmol/L (ref 135–145)
Total Bilirubin: 0.8 mg/dL (ref 0.3–1.2)
Total Protein: 5.5 g/dL — ABNORMAL LOW (ref 6.5–8.1)

## 2019-09-09 LAB — CBC WITH DIFFERENTIAL (CANCER CENTER ONLY)
Abs Immature Granulocytes: 0.03 10*3/uL (ref 0.00–0.07)
Basophils Absolute: 0 10*3/uL (ref 0.0–0.1)
Basophils Relative: 0 %
Eosinophils Absolute: 0 10*3/uL (ref 0.0–0.5)
Eosinophils Relative: 0 %
HCT: 37.9 % (ref 36.0–46.0)
Hemoglobin: 11.8 g/dL — ABNORMAL LOW (ref 12.0–15.0)
Immature Granulocytes: 1 %
Lymphocytes Relative: 19 %
Lymphs Abs: 1.1 10*3/uL (ref 0.7–4.0)
MCH: 27.6 pg (ref 26.0–34.0)
MCHC: 31.1 g/dL (ref 30.0–36.0)
MCV: 88.8 fL (ref 80.0–100.0)
Monocytes Absolute: 0.6 10*3/uL (ref 0.1–1.0)
Monocytes Relative: 10 %
Neutro Abs: 4.1 10*3/uL (ref 1.7–7.7)
Neutrophils Relative %: 70 %
Platelet Count: 196 10*3/uL (ref 150–400)
RBC: 4.27 MIL/uL (ref 3.87–5.11)
RDW: 15.5 % (ref 11.5–15.5)
WBC Count: 5.8 10*3/uL (ref 4.0–10.5)
nRBC: 0 % (ref 0.0–0.2)

## 2019-09-09 LAB — IRON AND TIBC
Iron: 41 ug/dL (ref 41–142)
Saturation Ratios: 14 % — ABNORMAL LOW (ref 21–57)
TIBC: 294 ug/dL (ref 236–444)
UIBC: 253 ug/dL (ref 120–384)

## 2019-09-09 LAB — TOTAL PROTEIN, URINE DIPSTICK: Protein, ur: NEGATIVE mg/dL

## 2019-09-09 LAB — LACTATE DEHYDROGENASE: LDH: 192 U/L (ref 98–192)

## 2019-09-09 LAB — FERRITIN: Ferritin: 153 ng/mL (ref 11–307)

## 2019-09-09 LAB — CEA (IN HOUSE-CHCC): CEA (CHCC-In House): 4.84 ng/mL (ref 0.00–5.00)

## 2019-09-09 MED ORDER — SODIUM CHLORIDE 0.9% FLUSH
10.0000 mL | INTRAVENOUS | Status: DC | PRN
  Filled 2019-09-09: qty 10

## 2019-09-09 MED ORDER — SODIUM CHLORIDE 0.9 % IV SOLN
Freq: Once | INTRAVENOUS | Status: AC
  Filled 2019-09-09: qty 250

## 2019-09-09 MED ORDER — SODIUM CHLORIDE 0.9 % IV SOLN
135.0000 mg/m2 | Freq: Once | INTRAVENOUS | Status: AC
  Administered 2019-09-09: 260 mg via INTRAVENOUS
  Filled 2019-09-09: qty 8

## 2019-09-09 MED ORDER — SODIUM CHLORIDE 0.9 % IV SOLN
10.0000 mg | Freq: Once | INTRAVENOUS | Status: AC
  Administered 2019-09-09: 10 mg via INTRAVENOUS
  Filled 2019-09-09: qty 10

## 2019-09-09 MED ORDER — HEPARIN SOD (PORK) LOCK FLUSH 100 UNIT/ML IV SOLN
500.0000 [IU] | Freq: Once | INTRAVENOUS | Status: DC | PRN
  Filled 2019-09-09: qty 5

## 2019-09-09 MED ORDER — ATROPINE SULFATE 1 MG/ML IJ SOLN: 0.5000 mg | Freq: Once | INTRAMUSCULAR | Status: DC | PRN

## 2019-09-09 MED ORDER — PALONOSETRON HCL INJECTION 0.25 MG/5ML
0.2500 mg | Freq: Once | INTRAVENOUS | Status: AC
  Administered 2019-09-09: 0.25 mg via INTRAVENOUS

## 2019-09-09 MED ORDER — FLUOROURACIL CHEMO INJECTION 2.5 GM/50ML
320.0000 mg/m2 | Freq: Once | INTRAVENOUS | Status: AC
  Administered 2019-09-09: 650 mg via INTRAVENOUS
  Filled 2019-09-09: qty 13

## 2019-09-09 MED ORDER — PALONOSETRON HCL INJECTION 0.25 MG/5ML
INTRAVENOUS | Status: AC
  Filled 2019-09-09: qty 5

## 2019-09-09 MED ORDER — SODIUM CHLORIDE 0.9 % IV SOLN
200.0000 mg | Freq: Once | INTRAVENOUS | Status: AC
  Administered 2019-09-09: 200 mg via INTRAVENOUS
  Filled 2019-09-09: qty 200

## 2019-09-09 MED ORDER — SODIUM CHLORIDE 0.9 % IV SOLN
400.0000 mg/m2 | Freq: Once | INTRAVENOUS | Status: AC
  Administered 2019-09-09: 784 mg via INTRAVENOUS
  Filled 2019-09-09: qty 39.2

## 2019-09-09 MED ORDER — SODIUM CHLORIDE 0.9 % IV SOLN
1920.0000 mg/m2 | INTRAVENOUS | Status: DC
  Administered 2019-09-09: 3750 mg via INTRAVENOUS
  Filled 2019-09-09: qty 75

## 2019-09-09 MED ORDER — SODIUM CHLORIDE 0.9 % IV SOLN
5.0000 mg/kg | Freq: Once | INTRAVENOUS | Status: AC
  Administered 2019-09-09: 400 mg via INTRAVENOUS
  Filled 2019-09-09: qty 16

## 2019-09-09 NOTE — Progress Notes (Signed)
Hematology and Oncology Follow Up Visit  EVA GRIFFO [id number] [birth year] 79 y.o. 09/09/2019   Principle Diagnosis:  Stage IV adenocarcinoma of the ascending colon -- NRAS+, BRAF-, HER2-, MSI/MMR -,  Iron deficiency anemia  Past Therapy: FOLFOX - s/p cycle  #5  Current Therapy:        FOLFIRI/Bevacizumab -started 01/20/2019,s/p cycle #10 IV Iron as indicated   Interim History:  Ms. Shidler is here today for follow-up and treatment.  She has done remarkably well.  She had a PET scan done last week.  The PET scan showed that the tumor in her liver is basically photopenic now.  She still has activity in the colon where the actual tumor originated.  There really is no disease noted elsewhere.  Her last CEA level in April was 3.48.  She still has a little bit of iron deficiency.  We do give her iron on occasion.  She is eating quite well.  She is having no problems with nausea and vomiting.  She is having no problems with bowels or bladder.  There is no diarrhea.  She does have some dizziness on occasion.  Of note, she still has the chronic stroke issues.  She has some weakness on the left side.  Currently, I would have to say that her performance status is ECOG 1.    Medications:  Allergies as of 09/09/2019      Reactions   Hydrocodone Swelling, Other (See Comments)   "I started swelling, became red, and passed out"   Iohexol Hives, Shortness Of Breath, Other (See Comments)   Patient developed hives and fullness in throat post injection of 125cc's Omni 300, Onset Date: 11/15/2006   Naproxen Other (See Comments)   "It made me feel out of my head"   Ibuprofen Nausea Only   Propoxyphene N-acetaminophen Other (See Comments)   "I couldn't find the door to make my way out of the room- I was in misery"      Medication List       Accurate as of Sep 09, 2019 11:09 AM. If you have any questions, ask your nurse or doctor.        STOP taking these medications   lidocaine 2 %  solution Commonly known as: XYLOCAINE Stopped by: [name], MD   True Metrix Level 1 Low Soln Stopped by: [name], MD     TAKE these medications   amLODipine 10 MG tablet Commonly known as: NORVASC Take 1 tablet (10 mg total) by mouth daily.   aspirin EC 81 MG tablet Take 81 mg by mouth daily.   atorvastatin 10 MG tablet Commonly known as: LIPITOR Take 1 tablet (10 mg total) by mouth daily.   B-D SINGLE USE SWABS REGULAR Pads 1 each by Does not apply route as needed (to cleanse site prior to obtaining droplet of blood for CBG's). E11.9   Cholecalciferol 50 MCG (2000 UT) Tabs Take 1 tablet (2,000 Units total) by mouth daily.   dexamethasone 4 MG tablet Commonly known as: DECADRON Take 2 tablets (8 mg total) by mouth 2 (two) times daily. For four days following chemotherapy.   dronabinol 2.5 MG capsule Commonly known as: MARINOL Take 1 capsule (2.5 mg total) by mouth 2 (two) times daily before a meal.   famotidine 40 MG tablet Commonly known as: Pepcid Take 1 tablet (40 mg total) by mouth 2 (two) times daily.   lidocaine-prilocaine cream Commonly known as: EMLA   loperamide 2 MG  tablet Commonly known as: Imodium A-D Take 2 tablets (4 mg total) by mouth 4 (four) times daily as needed. Take 2 at diarrhea onset , then 1 every 2hr until 12hrs with no BM. May take 2 every 4hrs at night. If diarrhea recurs repeat.   magic mouthwash Soln Swish and swallow equal parts Benadryl, Lidocaine and Nystatin.  5- 10 ml.'s four times a day as needed.   meclizine 12.5 MG tablet Commonly known as: ANTIVERT TAKE 1 TABLET(12.5 MG) BY MOUTH THREE TIMES DAILY AS NEEDED FOR DIZZINESS   metoprolol tartrate 25 MG tablet Commonly known as: LOPRESSOR Take 1 tablet (25 mg total) by mouth 2 (two) times daily.   olmesartan 20 MG tablet Commonly known as: BENICAR Take 1 tablet (20 mg total) by mouth daily.   B-D ULTRAFINE III SHORT PEN 31G X 8 MM Misc Generic drug: Insulin  Pen Needle   Pen Needles 32G X 4 MM Misc 1 each by Does not apply route daily. E11.9   temazepam 7.5 MG capsule Commonly known as: Restoril Take 1 capsule (7.5 mg total) by mouth at bedtime as needed for sleep.   traMADol 50 MG tablet Commonly known as: ULTRAM Take 1 tablet (50 mg total) by mouth every 6 (six) hours as needed.   Tyler Aas FlexTouch 100 UNIT/ML FlexTouch Pen Generic drug: insulin degludec Inject 0.2 mLs (20 Units total) into the skin daily. What changed: additional instructions   True Metrix Air Glucose Meter Devi 1 each by Does not apply route 2 (two) times daily. E11.9   True Metrix Blood Glucose Test test strip Generic drug: glucose blood 1 each by Other route 2 (two) times daily. E11.9   TRUEplus Lancets 30G Misc 1 each by Does not apply route 2 (two) times daily. E11.9       Allergies:  Allergies  Allergen Reactions  . Hydrocodone Swelling and Other (See Comments)    "I started swelling, became red, and passed out"  . Iohexol Hives, Shortness Of Breath and Other (See Comments)    Patient developed hives and fullness in throat post injection of 125cc's Omni 300, Onset Date: 11/15/2006   . Naproxen Other (See Comments)    "It made me feel out of my head"  . Ibuprofen Nausea Only  . Propoxyphene N-Acetaminophen Other (See Comments)    "I couldn't find the door to make my way out of the room- I was in misery"    Past Medical History, Surgical history, Social history, and Family History were reviewed and updated.  Review of Systems: Review of Systems  Constitutional: Negative.   HENT: Negative.   Eyes: Negative.   Respiratory: Negative.   Cardiovascular: Negative.   Gastrointestinal: Positive for nausea.  Genitourinary: Negative.   Musculoskeletal: Negative.   Skin: Negative.   Neurological: Positive for dizziness and focal weakness.  Endo/Heme/Allergies: Negative.   Psychiatric/Behavioral: Negative.      Physical Exam:  weight is 180 lb  (81.6 kg). Her temporal temperature is 97.1 F (36.2 C) (abnormal). Her blood pressure is 176/77 (abnormal) and her pulse is 76. Her respiration is 19 and oxygen saturation is 100%.   Wt Readings from Last 3 Encounters:  09/09/19 180 lb (81.6 kg)  08/19/19 179 lb (81.2 kg)  07/30/19 180 lb (81.6 kg)    Physical Exam Vitals reviewed.  HENT:     Head: Normocephalic and atraumatic.  Eyes:     Pupils: Pupils are equal, round, and reactive to light.  Cardiovascular:  Rate and Rhythm: Normal rate and regular rhythm.     Heart sounds: Normal heart sounds.  Pulmonary:     Effort: Pulmonary effort is normal.     Breath sounds: Normal breath sounds.  Abdominal:     General: Bowel sounds are normal.     Palpations: Abdomen is soft.  Musculoskeletal:        General: No tenderness or deformity. Normal range of motion.     Cervical back: Normal range of motion.  Lymphadenopathy:     Cervical: No cervical adenopathy.  Skin:    General: Skin is warm and dry.     Findings: No erythema or rash.  Neurological:     Mental Status: She is alert and oriented to person, place, and time.     Comments: She has chronic weakness over on the right side from the past stroke.  Psychiatric:        Behavior: Behavior normal.        Thought Content: Thought content normal.        Judgment: Judgment normal.      Lab Results  Component Value Date   WBC 5.8 09/09/2019   HGB 11.8 (L) 09/09/2019   HCT 37.9 09/09/2019   MCV 88.8 09/09/2019   PLT 196 09/09/2019   Lab Results  Component Value Date   FERRITIN 183 08/19/2019   IRON 46 08/19/2019   TIBC 288 08/19/2019   UIBC 241 08/19/2019   IRONPCTSAT 16 (L) 08/19/2019   Lab Results  Component Value Date   RETICCTPCT 1.5 05/17/2016   RBC 4.27 09/09/2019   RETICCTABS 62,400 05/17/2016   No results found for: KPAFRELGTCHN, LAMBDASER, KAPLAMBRATIO No results found for: IGGSERUM, IGA, IGMSERUM No results found for: Odetta Pink, SPEI   Chemistry      Component Value Date/Time   NA 143 09/09/2019 1009   NA 139 09/30/2018 1311   K 3.9 09/09/2019 1009   CL 106 09/09/2019 1009   CO2 29 09/09/2019 1009   BUN 11 09/09/2019 1009   BUN 10 09/30/2018 1311   CREATININE 0.77 09/09/2019 1009      Component Value Date/Time   CALCIUM 9.7 09/09/2019 1009   ALKPHOS 78 09/09/2019 1009   AST 15 09/09/2019 1009   ALT 13 09/09/2019 1009   BILITOT 0.8 09/09/2019 1009       Impression and Plan: Ms. Cassel is a very pleasant 79 yo African American female with metastatic colon cancer.  Her blood counts look quite good.  I must say that the every 3-week protocol does work well.  We will go ahead and give her 4 cycles of treatment again and then we will rescan her.  I still have thought about the possibility of trying to resect out the actual primary in her colon.  I know this is somewhat controversial.  We will have her come back in 3 more weeks for follow-up and then her seventh cycle of treatment.Marland Kitchen    [name], MD 5/18/202111:09 AM

## 2019-09-09 NOTE — Telephone Encounter (Signed)
Appointments scheduled calendar printed per 5/18 los 

## 2019-09-09 NOTE — Patient Instructions (Signed)
[facility] Discharge Instructions for Patients Receiving Chemotherapy  Today you received the following chemotherapy agents Irinotecan, 5FU  To help prevent nausea and vomiting after your treatment, we encourage you to take your nausea medication    If you develop nausea and vomiting that is not controlled by your nausea medication, call the clinic.   BELOW ARE SYMPTOMS THAT SHOULD BE REPORTED IMMEDIATELY:  *FEVER GREATER THAN 100.5 F  *CHILLS WITH OR WITHOUT FEVER  NAUSEA AND VOMITING THAT IS NOT CONTROLLED WITH YOUR NAUSEA MEDICATION  *UNUSUAL SHORTNESS OF BREATH  *UNUSUAL BRUISING OR BLEEDING  TENDERNESS IN MOUTH AND THROAT WITH OR WITHOUT PRESENCE OF ULCERS  *URINARY PROBLEMS  *BOWEL PROBLEMS  UNUSUAL RASH Items with * indicate a potential emergency and should be followed up as soon as possible.  Feel free to call the clinic should you have any questions or concerns. The clinic phone number is [phone].  Please show the CHEMO ALERT CARD at check-in to the Emergency Department and triage nurse.   

## 2019-09-09 NOTE — Progress Notes (Signed)
MD would like patient to receive Venofer 200mg  today and on pump d/c day, 09/11/19. No auth required per her insurance per Baxter Flattery.  Hardie Pulley, PharmD, BCPS, BCOP

## 2019-09-10 ENCOUNTER — Other Ambulatory Visit: Payer: Medicare HMO

## 2019-09-10 ENCOUNTER — Ambulatory Visit: Payer: Medicare HMO | Admitting: Family

## 2019-09-10 ENCOUNTER — Ambulatory Visit: Payer: Medicare HMO

## 2019-09-11 ENCOUNTER — Inpatient Hospital Stay: Payer: Medicare HMO

## 2019-09-11 ENCOUNTER — Other Ambulatory Visit: Payer: Self-pay

## 2019-09-11 VITALS — BP 148/76 | HR 79 | Temp 97.5°F | Resp 20

## 2019-09-11 DIAGNOSIS — Z7982 Long term (current) use of aspirin: Secondary | ICD-10-CM | POA: Diagnosis not present

## 2019-09-11 DIAGNOSIS — Z5111 Encounter for antineoplastic chemotherapy: Secondary | ICD-10-CM | POA: Diagnosis not present

## 2019-09-11 DIAGNOSIS — D509 Iron deficiency anemia, unspecified: Secondary | ICD-10-CM | POA: Diagnosis not present

## 2019-09-11 DIAGNOSIS — D500 Iron deficiency anemia secondary to blood loss (chronic): Secondary | ICD-10-CM

## 2019-09-11 DIAGNOSIS — C182 Malignant neoplasm of ascending colon: Secondary | ICD-10-CM | POA: Diagnosis not present

## 2019-09-11 DIAGNOSIS — Z79899 Other long term (current) drug therapy: Secondary | ICD-10-CM | POA: Diagnosis not present

## 2019-09-11 DIAGNOSIS — C189 Malignant neoplasm of colon, unspecified: Secondary | ICD-10-CM

## 2019-09-11 DIAGNOSIS — Z8673 Personal history of transient ischemic attack (TIA), and cerebral infarction without residual deficits: Secondary | ICD-10-CM | POA: Diagnosis not present

## 2019-09-11 DIAGNOSIS — Z5112 Encounter for antineoplastic immunotherapy: Secondary | ICD-10-CM | POA: Diagnosis not present

## 2019-09-11 DIAGNOSIS — Z7952 Long term (current) use of systemic steroids: Secondary | ICD-10-CM | POA: Diagnosis not present

## 2019-09-11 MED ORDER — HEPARIN SOD (PORK) LOCK FLUSH 100 UNIT/ML IV SOLN
500.0000 [IU] | Freq: Once | INTRAVENOUS | Status: AC | PRN
  Administered 2019-09-11: 500 [IU]
  Filled 2019-09-11: qty 5

## 2019-09-11 MED ORDER — SODIUM CHLORIDE 0.9 % IV SOLN
Freq: Once | INTRAVENOUS | Status: AC
  Filled 2019-09-11: qty 250

## 2019-09-11 MED ORDER — SODIUM CHLORIDE 0.9 % IV SOLN
200.0000 mg | Freq: Once | INTRAVENOUS | Status: AC
  Administered 2019-09-11: 200 mg via INTRAVENOUS
  Filled 2019-09-11: qty 200

## 2019-09-11 MED ORDER — SODIUM CHLORIDE 0.9% FLUSH
10.0000 mL | INTRAVENOUS | Status: DC | PRN
  Administered 2019-09-11: 10 mL
  Filled 2019-09-11: qty 10

## 2019-09-11 NOTE — Patient Instructions (Signed)

## 2019-09-17 ENCOUNTER — Other Ambulatory Visit: Payer: Self-pay

## 2019-09-19 ENCOUNTER — Other Ambulatory Visit: Payer: Self-pay

## 2019-09-19 ENCOUNTER — Encounter: Payer: Self-pay | Admitting: Endocrinology

## 2019-09-19 ENCOUNTER — Ambulatory Visit (INDEPENDENT_AMBULATORY_CARE_PROVIDER_SITE_OTHER): Payer: Medicare HMO | Admitting: Endocrinology

## 2019-09-19 VITALS — BP 132/88 | HR 80 | Ht 71.0 in | Wt 187.0 lb

## 2019-09-19 DIAGNOSIS — Z794 Long term (current) use of insulin: Secondary | ICD-10-CM

## 2019-09-19 DIAGNOSIS — E1149 Type 2 diabetes mellitus with other diabetic neurological complication: Secondary | ICD-10-CM | POA: Diagnosis not present

## 2019-09-19 LAB — POCT GLYCOSYLATED HEMOGLOBIN (HGB A1C): Hemoglobin A1C: 7.6 % — AB (ref 4.0–5.6)

## 2019-09-19 MED ORDER — TRESIBA FLEXTOUCH 100 UNIT/ML ~~LOC~~ SOPN: 21.0000 [IU] | PEN_INJECTOR | Freq: Every day | SUBCUTANEOUS | 11 refills | Status: DC

## 2019-09-19 NOTE — Progress Notes (Signed)
Subjective:    Patient ID: Tonya [name], female    DOB: [birth year], 79 y.o.   MRN: [id number]  HPI Pt returns for f/u of diabetes mellitus: DM type: Insulin-requiring type 2 (but lean body habitus raises possibility she is evolving type 1).   Dx'ed: [id number] Complications: CVA Therapy: insulin since 2020.  GDM: never DKA: never Severe hypoglycemia: never.   Pancreatitis: never Pancreatic imaging: normal on 2010 [place].  Other: she did not tolerate metformin-XR (abd pain); she also took insulin only for a brief time after diagnosis; edema limits rx options; she is not a candidate for aggressive glycemic control, given colon cancer; plan is to continue chemotx indefinitely. Interval history: she brings a record of her cbg's which I have reviewed today.  cbg varies from 90-383.  It is in general higher as the day goes on, and for 1 day after each chemo dose (which is every 3 weeks).  She takes decadron just 1 day after each chemotx.  She still takes [place], 22 units daily, except 28 on each chemo day.   Past Medical History:  Diagnosis Date  . Cerebrovascular accident ([place])   . Chronic edema    ? venous insufficiency  . COPD (chronic obstructive pulmonary disease) ([place])    never had a problem breathing  . Diabetes mellitus without complication ([place])   . Enlarged heart    per pt  . Gallstones   . GERD (gastroesophageal reflux disease)   . Goals of care, counseling/discussion 11/01/2018  . Hyperlipidemia   . Hypertension     Past Surgical History:  Procedure Laterality Date  . ABDOMINAL HYSTERECTOMY    . BIOPSY  10/18/2018   Procedure: BIOPSY;  Surgeon: Jackquline Denmark, MD;  Location: [facility] ENDOSCOPY;  Service: Endoscopy;;  . CHOLECYSTECTOMY    . COLONOSCOPY WITH PROPOFOL N/A 10/18/2018   Procedure: COLONOSCOPY WITH PROPOFOL;  Surgeon: Jackquline Denmark, MD;  Location[facility] ENDOSCOPY;  Service: Endoscopy;  Laterality: N/A;  . IR IMAGING GUIDED PORT INSERTION  11/11/2018  . POLYPECTOMY  10/18/2018    Procedure: POLYPECTOMY;  Surgeon: Jackquline Denmark, MD;  Location[facility] ENDOSCOPY;  Service: Endoscopy;;  . SUBMUCOSAL TATTOO INJECTION  10/18/2018   Procedure: SUBMUCOSAL TATTOO INJECTION;  Surgeon: Jackquline Denmark, MD;  Location: [facility] ENDOSCOPY;  Service: Endoscopy;;    Social History   Socioeconomic History  . Marital status: Widowed    Spouse name: Not on file  . Number of children: 5  . Years of education: Not on file  . Highest education level: Not on file  Occupational History  . Not on file  Tobacco Use  . Smoking status: Former Smoker    Packs/day: 0.25    Years: 1.00    Pack years: 0.25    Quit date: 07/23/1968    Years since quitting: 51.1  . Smokeless tobacco: Never Used  Substance and Sexual Activity  . Alcohol use: No    Alcohol/week: 0.0 standard drinks  . Drug use: No  . Sexual activity: Not Currently  Other Topics Concern  . Not on file  Social History Narrative  . Not on file   Social Determinants of Health   Financial Resource Strain:   . Difficulty of Paying Living Expenses:   Food Insecurity:   . Worried About Charity fundraiser in the Last Year:   . Arboriculturist in the Last Year:   Transportation Needs:   . Film/video editor (Medical):   Marland Kitchen Lack of Transportation (Non-Medical):  Physical Activity:   . Days of Exercise per Week:   . Minutes of Exercise per Session:   Stress:   . Feeling of Stress :   Social Connections:   . Frequency of Communication with Friends and Family:   . Frequency of Social Gatherings with Friends and Family:   . Attends Religious Services:   . Active Member of Clubs or Organizations:   . Attends Archivist Meetings:   Marland Kitchen Marital Status:   Intimate Partner Violence:   . Fear of Current or Ex-Partner:   . Emotionally Abused:   Marland Kitchen Physically Abused:   . Sexually Abused:     Current Outpatient Medications on File Prior to Visit  Medication Sig Dispense Refill  . Alcohol Swabs (B-D SINGLE USE SWABS  REGULAR) PADS 1 each by Does not apply route as needed (to cleanse site prior to obtaining droplet of blood for CBG's). E11.9 100 each 2  . amLODipine (NORVASC) 10 MG tablet Take 1 tablet (10 mg total) by mouth daily. 90 tablet 0  . aspirin EC 81 MG tablet Take 81 mg by mouth daily.    Marland Kitchen atorvastatin (LIPITOR) 10 MG tablet Take 1 tablet (10 mg total) by mouth daily. 90 tablet 1  . B-D ULTRAFINE III SHORT PEN 31G X 8 MM MISC     . Cholecalciferol 2000 units TABS Take 1 tablet (2,000 Units total) by mouth daily. 90 tablet 1  . dexamethasone (DECADRON) 4 MG tablet Take 2 tablets (8 mg total) by mouth 2 (two) times daily. For four days following chemotherapy. 30 tablet 2  . famotidine (PEPCID) 40 MG tablet Take 1 tablet (40 mg total) by mouth 2 (two) times daily. 180 tablet 3  . glucose blood (TRUE METRIX BLOOD GLUCOSE TEST) test strip 1 each by Other route 2 (two) times daily. E11.9 180 each 0  . lidocaine-prilocaine (EMLA) cream     . loperamide (IMODIUM A-D) 2 MG tablet Take 2 tablets (4 mg total) by mouth 4 (four) times daily as needed. Take 2 at diarrhea onset , then 1 every 2hr until 12hrs with no BM. May take 2 every 4hrs at night. If diarrhea recurs repeat. 100 tablet 1  . magic mouthwash SOLN Swish and swallow equal parts Benadryl, Lidocaine and Nystatin.  5- 10 ml.'s four times a day as needed. 240 mL 0  . meclizine (ANTIVERT) 12.5 MG tablet TAKE 1 TABLET(12.5 MG) BY MOUTH THREE TIMES DAILY AS NEEDED FOR DIZZINESS 30 tablet 2  . metoprolol tartrate (LOPRESSOR) 25 MG tablet Take 1 tablet (25 mg total) by mouth 2 (two) times daily. 180 tablet 0  . olmesartan (BENICAR) 20 MG tablet Take 1 tablet (20 mg total) by mouth daily. 90 tablet 1  . temazepam (RESTORIL) 7.5 MG capsule Take 1 capsule (7.5 mg total) by mouth at bedtime as needed for sleep. 30 capsule 0  . traMADol (ULTRAM) 50 MG tablet Take 1 tablet (50 mg total) by mouth every 6 (six) hours as needed. 30 tablet 0  . TRUEplus Lancets 30G  MISC 1 each by Does not apply route 2 (two) times daily. E11.9 180 each 0  . [DISCONTINUED] prochlorperazine (COMPAZINE) 10 MG tablet Take 1 tablet (10 mg total) by mouth every 6 (six) hours as needed (Nausea or vomiting). 30 tablet 1   No current facility-administered medications on file prior to visit.    Allergies  Allergen Reactions  . Hydrocodone Swelling and Other (See Comments)    "I started  swelling, became red, and passed out"  . Iohexol Hives, Shortness Of Breath and Other (See Comments)    Patient developed hives and fullness in throat post injection of 125cc's Omni 300, Onset Date: 11/15/2006   . Naproxen Other (See Comments)    "It made me feel out of my head"  . Ibuprofen Nausea Only  . Propoxyphene N-Acetaminophen Other (See Comments)    "I couldn't find the door to make my way out of the room- I was in misery"    Family History  Problem Relation Age of Onset  . Cirrhosis Mother   . Heart disease Father   . Cancer Other   . Hypertension Other   . Diabetes Other     BP 132/88   Pulse 80   Ht 5\' 11"  (1.803 m)   Wt 187 lb (84.8 kg)   SpO2 95%   BMI 26.08 kg/m    Review of Systems She denies hypoglycemia.      Objective:   Physical Exam VITAL SIGNS:  See vs page GENERAL: no distress Pulses: dorsalis pedis intact bilat.   MSK: no deformity of the feet CV: 2+ left and 1+right leg edema Skin:  no ulcer on the feet.  normal color and temp on the feet. Neuro: sensation is intact to touch on the feet  Lab Results  Component Value Date   HGBA1C 7.6 (A) 09/19/2019   Lab Results  Component Value Date   CREATININE 0.77 09/09/2019   BUN 11 09/09/2019   NA 143 09/09/2019   K 3.9 09/09/2019   CL 106 09/09/2019   CO2 29 09/09/2019        Assessment & Plan:  Insulin-requiring type 2 DM: She would benefit from increased rx, if it can be done with a regimen that avoids or minimizes hypoglycemia. HTN: is noted today.  Colon cancer: she is not a candidate  for aggressive glycemic control  Patient Instructions  Please reduce the Tresiba to 21 units daily.  However, take 34 on each chemo day.   Please come back for a follow-up appointment in 2 months.   Your blood pressure is high today.  Please see your primary care provider soon, to have it rechecked check your blood sugar twice a day.  vary the time of day when you check, between before the 3 meals, and at bedtime.  also check if you have symptoms of your blood sugar being too high or too low.  please keep a record of the readings and bring it to your next appointment here (or you can bring the meter itself).  You can write it on any piece of paper.  please call us sooner if your blood sugar goes below 70, or if you have a lot of readings over 200.

## 2019-09-19 NOTE — Progress Notes (Unsigned)
Pharmacist Chemotherapy Monitoring - Follow Up Assessment    I verify that I have reviewed each item in the below checklist:  . Regimen for the patient is scheduled for the appropriate day and plan matches scheduled date. Marland Kitchen Appropriate non-routine labs are ordered dependent on drug ordered. . If applicable, additional medications reviewed and ordered per protocol based on lifetime cumulative doses and/or treatment regimen.   Plan for follow-up and/or issues identified: No . I-vent associated with next due treatment: No . MD and/or nursing notified: No  Tonya [name], Jacqlyn Larsen 09/19/2019 9:09 AM

## 2019-09-19 NOTE — Patient Instructions (Addendum)
Please reduce the Tresiba to 21 units daily.  However, take 34 on each chemo day.   Please come back for a follow-up appointment in 2 months.   Your blood pressure is high today.  Please see your primary care provider soon, to have it rechecked check your blood sugar twice a day.  vary the time of day when you check, between before the 3 meals, and at bedtime.  also check if you have symptoms of your blood sugar being too high or too low.  please keep a record of the readings and bring it to your next appointment here (or you can bring the meter itself).  You can write it on any piece of paper.  please call us sooner if your blood sugar goes below 70, or if you have a lot of readings over 200.

## 2019-09-26 ENCOUNTER — Telehealth: Payer: Self-pay | Admitting: Hematology & Oncology

## 2019-09-26 NOTE — Telephone Encounter (Signed)
Patient's daughter came in stating that her mother was having swelling in lower extremity and her mother was not with her due to being too hot.  Judson Roch advised her daughter that an [place] could be ordered if needed and/or she would need to take her to the ER.  The daughter voiced complete understanding of these instructions.

## 2019-09-29 ENCOUNTER — Inpatient Hospital Stay: Payer: Medicare HMO

## 2019-09-29 ENCOUNTER — Inpatient Hospital Stay: Payer: Medicare HMO | Admitting: Family

## 2019-09-29 DIAGNOSIS — C187 Malignant neoplasm of sigmoid colon: Secondary | ICD-10-CM | POA: Diagnosis not present

## 2019-09-30 ENCOUNTER — Inpatient Hospital Stay: Payer: Medicare HMO | Attending: Hematology & Oncology

## 2019-09-30 ENCOUNTER — Encounter: Payer: Self-pay | Admitting: Family

## 2019-09-30 ENCOUNTER — Inpatient Hospital Stay (HOSPITAL_BASED_OUTPATIENT_CLINIC_OR_DEPARTMENT_OTHER): Payer: Medicare HMO | Admitting: Family

## 2019-09-30 ENCOUNTER — Other Ambulatory Visit: Payer: Self-pay

## 2019-09-30 ENCOUNTER — Inpatient Hospital Stay: Payer: Medicare HMO

## 2019-09-30 VITALS — BP 154/77 | HR 68 | Temp 97.3°F | Resp 17 | Ht 71.0 in

## 2019-09-30 DIAGNOSIS — D500 Iron deficiency anemia secondary to blood loss (chronic): Secondary | ICD-10-CM

## 2019-09-30 DIAGNOSIS — Z7952 Long term (current) use of systemic steroids: Secondary | ICD-10-CM | POA: Diagnosis not present

## 2019-09-30 DIAGNOSIS — C189 Malignant neoplasm of colon, unspecified: Secondary | ICD-10-CM

## 2019-09-30 DIAGNOSIS — C182 Malignant neoplasm of ascending colon: Secondary | ICD-10-CM | POA: Insufficient documentation

## 2019-09-30 DIAGNOSIS — D509 Iron deficiency anemia, unspecified: Secondary | ICD-10-CM | POA: Diagnosis not present

## 2019-09-30 DIAGNOSIS — Z7982 Long term (current) use of aspirin: Secondary | ICD-10-CM | POA: Insufficient documentation

## 2019-09-30 DIAGNOSIS — Z8673 Personal history of transient ischemic attack (TIA), and cerebral infarction without residual deficits: Secondary | ICD-10-CM | POA: Diagnosis not present

## 2019-09-30 DIAGNOSIS — K5900 Constipation, unspecified: Secondary | ICD-10-CM | POA: Insufficient documentation

## 2019-09-30 DIAGNOSIS — Z5111 Encounter for antineoplastic chemotherapy: Secondary | ICD-10-CM | POA: Insufficient documentation

## 2019-09-30 DIAGNOSIS — Z86718 Personal history of other venous thrombosis and embolism: Secondary | ICD-10-CM | POA: Diagnosis not present

## 2019-09-30 DIAGNOSIS — Z5112 Encounter for antineoplastic immunotherapy: Secondary | ICD-10-CM | POA: Diagnosis not present

## 2019-09-30 DIAGNOSIS — C787 Secondary malignant neoplasm of liver and intrahepatic bile duct: Secondary | ICD-10-CM | POA: Diagnosis not present

## 2019-09-30 DIAGNOSIS — Z79899 Other long term (current) drug therapy: Secondary | ICD-10-CM | POA: Diagnosis not present

## 2019-09-30 DIAGNOSIS — M7989 Other specified soft tissue disorders: Secondary | ICD-10-CM

## 2019-09-30 DIAGNOSIS — Z7901 Long term (current) use of anticoagulants: Secondary | ICD-10-CM | POA: Insufficient documentation

## 2019-09-30 DIAGNOSIS — C187 Malignant neoplasm of sigmoid colon: Secondary | ICD-10-CM | POA: Diagnosis not present

## 2019-09-30 LAB — CMP (CANCER CENTER ONLY)
ALT: 14 U/L (ref 0–44)
AST: 16 U/L (ref 15–41)
Albumin: 3.6 g/dL (ref 3.5–5.0)
Alkaline Phosphatase: 65 U/L (ref 38–126)
Anion gap: 7 (ref 5–15)
BUN: 15 mg/dL (ref 8–23)
CO2: 29 mmol/L (ref 22–32)
Calcium: 9.6 mg/dL (ref 8.9–10.3)
Chloride: 105 mmol/L (ref 98–111)
Creatinine: 0.85 mg/dL (ref 0.44–1.00)
GFR, Est AFR Am: >60 mL/min (ref >60)
GFR, Estimated: >60 mL/min (ref >60)
Glucose, Bld: 201 mg/dL — ABNORMAL HIGH (ref 70–99)
Potassium: 4 mmol/L (ref 3.5–5.1)
Sodium: 141 mmol/L (ref 135–145)
Total Bilirubin: 0.7 mg/dL (ref 0.3–1.2)
Total Protein: 5.6 g/dL — ABNORMAL LOW (ref 6.5–8.1)

## 2019-09-30 LAB — CBC WITH DIFFERENTIAL (CANCER CENTER ONLY)
Abs Immature Granulocytes: 0.03 10*3/uL (ref 0.00–0.07)
Basophils Absolute: 0 10*3/uL (ref 0.0–0.1)
Basophils Relative: 0 %
Eosinophils Absolute: 0 10*3/uL (ref 0.0–0.5)
Eosinophils Relative: 1 %
HCT: 37.8 % (ref 36.0–46.0)
Hemoglobin: 11.6 g/dL — ABNORMAL LOW (ref 12.0–15.0)
Immature Granulocytes: 1 %
Lymphocytes Relative: 21 %
Lymphs Abs: 1.1 10*3/uL (ref 0.7–4.0)
MCH: 27.9 pg (ref 26.0–34.0)
MCHC: 30.7 g/dL (ref 30.0–36.0)
MCV: 90.9 fL (ref 80.0–100.0)
Monocytes Absolute: 0.6 10*3/uL (ref 0.1–1.0)
Monocytes Relative: 11 %
Neutro Abs: 3.5 10*3/uL (ref 1.7–7.7)
Neutrophils Relative %: 66 %
Platelet Count: 188 10*3/uL (ref 150–400)
RBC: 4.16 MIL/uL (ref 3.87–5.11)
RDW: 15.9 % — ABNORMAL HIGH (ref 11.5–15.5)
WBC Count: 5.3 10*3/uL (ref 4.0–10.5)
nRBC: 0 % (ref 0.0–0.2)

## 2019-09-30 MED ORDER — SODIUM CHLORIDE 0.9 % IV SOLN
1920.0000 mg/m2 | INTRAVENOUS | Status: DC
  Administered 2019-09-30: 3750 mg via INTRAVENOUS
  Filled 2019-09-30: qty 75

## 2019-09-30 MED ORDER — SODIUM CHLORIDE 0.9 % IV SOLN
Freq: Once | INTRAVENOUS | Status: AC
  Filled 2019-09-30: qty 250

## 2019-09-30 MED ORDER — COLD PACK MISC ONCOLOGY
1.0000 | Freq: Once | Status: DC | PRN
  Filled 2019-09-30: qty 1

## 2019-09-30 MED ORDER — SODIUM CHLORIDE 0.9 % IV SOLN
5.0000 mg/kg | Freq: Once | INTRAVENOUS | Status: AC
  Administered 2019-09-30: 400 mg via INTRAVENOUS
  Filled 2019-09-30: qty 16

## 2019-09-30 MED ORDER — SODIUM CHLORIDE 0.9% FLUSH
10.0000 mL | INTRAVENOUS | Status: DC | PRN
  Filled 2019-09-30: qty 10

## 2019-09-30 MED ORDER — HEPARIN SOD (PORK) LOCK FLUSH 100 UNIT/ML IV SOLN
500.0000 [IU] | Freq: Once | INTRAVENOUS | Status: DC | PRN
  Filled 2019-09-30: qty 5

## 2019-09-30 MED ORDER — PALONOSETRON HCL INJECTION 0.25 MG/5ML
0.2500 mg | Freq: Once | INTRAVENOUS | Status: AC
  Administered 2019-09-30: 0.25 mg via INTRAVENOUS

## 2019-09-30 MED ORDER — SODIUM CHLORIDE 0.9 % IV SOLN
10.0000 mg | Freq: Once | INTRAVENOUS | Status: AC
  Administered 2019-09-30: 10 mg via INTRAVENOUS
  Filled 2019-09-30: qty 10

## 2019-09-30 MED ORDER — SODIUM CHLORIDE 0.9 % IV SOLN
135.0000 mg/m2 | Freq: Once | INTRAVENOUS | Status: AC
  Administered 2019-09-30: 260 mg via INTRAVENOUS
  Filled 2019-09-30: qty 5

## 2019-09-30 MED ORDER — ATROPINE SULFATE 1 MG/ML IJ SOLN
0.5000 mg | Freq: Once | INTRAMUSCULAR | Status: AC | PRN
  Administered 2019-09-30: 0.5 mg via INTRAVENOUS

## 2019-09-30 MED ORDER — PALONOSETRON HCL INJECTION 0.25 MG/5ML
INTRAVENOUS | Status: AC
  Filled 2019-09-30: qty 5

## 2019-09-30 MED ORDER — ATROPINE SULFATE 1 MG/ML IJ SOLN
INTRAMUSCULAR | Status: AC
  Filled 2019-09-30: qty 1

## 2019-09-30 MED ORDER — SODIUM CHLORIDE 0.9 % IV SOLN
400.0000 mg/m2 | Freq: Once | INTRAVENOUS | Status: AC
  Administered 2019-09-30: 784 mg via INTRAVENOUS
  Filled 2019-09-30: qty 39.2

## 2019-09-30 MED ORDER — FLUOROURACIL CHEMO INJECTION 2.5 GM/50ML
320.0000 mg/m2 | Freq: Once | INTRAVENOUS | Status: AC
  Administered 2019-09-30: 650 mg via INTRAVENOUS
  Filled 2019-09-30: qty 13

## 2019-09-30 NOTE — Progress Notes (Signed)
Hematology and Oncology Follow Up Visit  Tonya [name] [id number] 12-Apr-1941 79 y.o. 09/30/2019   Principle Diagnosis:  Stage IV adenocarcinoma of the ascending colon -- NRAS+, BRAF-, HER2-, MSI/MMR -,  Iron deficiency anemia  Past Therapy: FOLFOX - s/p cycle 5  Current Therapy: FOLFIRI/Bevacizumab -started 01/20/2019,s/p cycle 12 IV Iron as indicated   Interim History:  Tonya [name] is here today with her daughter for follow-up and treatment. She has chronic fatigue and states that she has 1-2 episodes of diarrhea every 2 weeks or so.  She does not want to take an antidiarrheal due to constipation.  She admits that she needs to better hydrate throughout the day. She has a good appetite and is also drinking one protein drink daily.  CEA last month was 4.84.  No fever, chills, n/v, cough, rash, dizziness, SOB, chest pain, palpitations, abdominal pain or changes in bladder habits.  She has chronic swelling in her lower extremities which is more significant in her left leg due to residual weakness from previous stroke. She is concerned that this is a little worse so we will get an [place] of both legs.   No falls or syncopal episodes to report.  No episodes of bleeding. No bruising or petechiae.   ECOG Performance Status: 2 - Symptomatic, <50% confined to bed  Medications:  Allergies as of 09/30/2019      Reactions   Hydrocodone Swelling, Other (See Comments)   "I started swelling, became red, and passed out"   Iohexol Hives, Shortness Of Breath, Other (See Comments)   Patient developed hives and fullness in throat post injection of 125cc's Omni 300, Onset Date: 11/15/2006   Naproxen Other (See Comments)   "It made me feel out of my head"   Ibuprofen Nausea Only   Propoxyphene N-acetaminophen Other (See Comments)   "I couldn't find the door to make my way out of the room- I was in misery"      Medication List       Accurate as of September 30, 2019 10:49 AM. If you have any  questions, ask your nurse or doctor.        amLODipine 10 MG tablet Commonly known as: NORVASC Take 1 tablet (10 mg total) by mouth daily.   aspirin EC 81 MG tablet Take 81 mg by mouth daily.   atorvastatin 10 MG tablet Commonly known as: LIPITOR Take 1 tablet (10 mg total) by mouth daily.   B-D SINGLE USE SWABS REGULAR Pads 1 each by Does not apply route as needed (to cleanse site prior to obtaining droplet of blood for CBG's). E11.9   B-D ULTRAFINE III SHORT PEN 31G X 8 MM Misc Generic drug: Insulin Pen Needle   Cholecalciferol 50 MCG (2000 UT) Tabs Take 1 tablet (2,000 Units total) by mouth daily.   dexamethasone 4 MG tablet Commonly known as: DECADRON Take 2 tablets (8 mg total) by mouth 2 (two) times daily. For four days following chemotherapy.   famotidine 40 MG tablet Commonly known as: Pepcid Take 1 tablet (40 mg total) by mouth 2 (two) times daily.   lidocaine-prilocaine cream Commonly known as: EMLA   loperamide 2 MG tablet Commonly known as: Imodium A-D Take 2 tablets (4 mg total) by mouth 4 (four) times daily as needed. Take 2 at diarrhea onset , then 1 every 2hr until 12hrs with no BM. May take 2 every 4hrs at night. If diarrhea recurs repeat.   magic mouthwash Soln Swish and swallow equal parts  Benadryl, Lidocaine and Nystatin.  5- 10 ml.'s four times a day as needed.   meclizine 12.5 MG tablet Commonly known as: ANTIVERT TAKE 1 TABLET(12.5 MG) BY MOUTH THREE TIMES DAILY AS NEEDED FOR DIZZINESS   metoprolol tartrate 25 MG tablet Commonly known as: LOPRESSOR Take 1 tablet (25 mg total) by mouth 2 (two) times daily.   olmesartan 20 MG tablet Commonly known as: BENICAR Take 1 tablet (20 mg total) by mouth daily.   temazepam 7.5 MG capsule Commonly known as: Restoril Take 1 capsule (7.5 mg total) by mouth at bedtime as needed for sleep.   traMADol 50 MG tablet Commonly known as: ULTRAM Take 1 tablet (50 mg total) by mouth every 6 (six) hours as  needed.   Tyler Aas FlexTouch 100 UNIT/ML FlexTouch Pen Generic drug: insulin degludec Inject 0.21 mLs (21 Units total) into the skin daily. Take 34 units on chemo days   True Metrix Blood Glucose Test test strip Generic drug: glucose blood 1 each by Other route 2 (two) times daily. E11.9   TRUEplus Lancets 30G Misc 1 each by Does not apply route 2 (two) times daily. E11.9       Allergies:  Allergies  Allergen Reactions   Hydrocodone Swelling and Other (See Comments)    "I started swelling, became red, and passed out"   Iohexol Hives, Shortness Of Breath and Other (See Comments)    Patient developed hives and fullness in throat post injection of 125cc's Omni 300, Onset Date: 11/15/2006    Naproxen Other (See Comments)    "It made me feel out of my head"   Ibuprofen Nausea Only   Propoxyphene N-Acetaminophen Other (See Comments)    "I couldn't find the door to make my way out of the room- I was in misery"    Past Medical History, Surgical history, Social history, and Family History were reviewed and updated.  Review of Systems: All other 10 point review of systems is negative.   Physical Exam:  vitals were not taken for this visit.   Wt Readings from Last 3 Encounters:  09/19/19 187 lb (84.8 kg)  09/09/19 180 lb (81.6 kg)  08/19/19 179 lb (81.2 kg)    Ocular: Sclerae unicteric, pupils equal, round and reactive to light Ear-nose-throat: Oropharynx clear, dentition fair Lymphatic: No cervical or supraclavicular adenopathy Lungs no rales or rhonchi, good excursion bilaterally Heart regular rate and rhythm, no murmur appreciated Abd soft, nontender, positive bowel sounds, no liver or spleen tip palpated on exam, no fluid wave  MSK no focal spinal tenderness, no joint edema Neuro: non-focal, well-oriented, appropriate affect Breasts: Deferred   Lab Results  Component Value Date   WBC 5.3 09/30/2019   HGB 11.6 (L) 09/30/2019   HCT 37.8 09/30/2019   MCV 90.9  09/30/2019   PLT 188 09/30/2019   Lab Results  Component Value Date   FERRITIN 153 09/09/2019   IRON 41 09/09/2019   TIBC 294 09/09/2019   UIBC 253 09/09/2019   IRONPCTSAT 14 (L) 09/09/2019   Lab Results  Component Value Date   RETICCTPCT 1.5 05/17/2016   RBC 4.16 09/30/2019   RETICCTABS 62,400 05/17/2016   No results found for: KPAFRELGTCHN, LAMBDASER, KAPLAMBRATIO No results found for: IGGSERUM, IGA, IGMSERUM No results found for: Ronnald Ramp, A1GS, A2GS, Violet Baldy, MSPIKE, SPEI   Chemistry      Component Value Date/Time   NA 141 09/30/2019 1022   NA 139 09/30/2018 1311   K 4.0 09/30/2019 1022  CL 105 09/30/2019 1022   CO2 29 09/30/2019 1022   BUN 15 09/30/2019 1022   BUN 10 09/30/2018 1311   CREATININE 0.85 09/30/2019 1022      Component Value Date/Time   CALCIUM 9.6 09/30/2019 1022   ALKPHOS 65 09/30/2019 1022   AST 16 09/30/2019 1022   ALT 14 09/30/2019 1022   BILITOT 0.7 09/30/2019 1022       Impression and Plan: Ms. Abdo is a very pleasant 79 yo African American female with metastatic colon cancer. We will proceed with treatment today as planned.  We will see what her iron studies show and bring her back in for infusion if needed.  She will stop on her way out for [place].  We will repeat another set of scans after another 2-3 cycles.  We will plan to see her again in another 3 weeks.  They will contact our office with any questions or concerns.   Laverna Peace, NP 6/8/202110:49 AM

## 2019-10-01 ENCOUNTER — Other Ambulatory Visit: Payer: Self-pay | Admitting: *Deleted

## 2019-10-01 ENCOUNTER — Ambulatory Visit (HOSPITAL_COMMUNITY)
Admission: RE | Admit: 2019-10-01 | Discharge: 2019-10-01 | Disposition: A | Discharge status: Home / Self Care | Payer: Medicare HMO | Source: Ambulatory Visit | Attending: Family | Admitting: Family

## 2019-10-01 ENCOUNTER — Telehealth: Payer: Self-pay | Admitting: *Deleted

## 2019-10-01 ENCOUNTER — Inpatient Hospital Stay: Payer: Medicare HMO

## 2019-10-01 DIAGNOSIS — I82432 Acute embolism and thrombosis of left popliteal vein: Secondary | ICD-10-CM | POA: Diagnosis not present

## 2019-10-01 DIAGNOSIS — M7989 Other specified soft tissue disorders: Secondary | ICD-10-CM | POA: Insufficient documentation

## 2019-10-01 LAB — IRON AND TIBC
Iron: 41 ug/dL (ref 41–142)
Saturation Ratios: 14 % — ABNORMAL LOW (ref 21–57)
TIBC: 294 ug/dL (ref 236–444)
UIBC: 253 ug/dL (ref 120–384)

## 2019-10-01 LAB — CEA (IN HOUSE-CHCC): CEA (CHCC-In House): 4.71 ng/mL (ref 0.00–5.00)

## 2019-10-01 LAB — FERRITIN: Ferritin: 306 ng/mL (ref 11–307)

## 2019-10-01 MED ORDER — APIXABAN 5 MG PO TABS
5.0000 mg | ORAL_TABLET | Freq: Two times a day (BID) | ORAL | 0 refills | Status: DC
Start: 2019-10-01 — End: 2019-10-24

## 2019-10-01 NOTE — Telephone Encounter (Signed)
Call received from [place] at [place] to inform Dr. Marin Olp that patient has a positive DVT in the left popliteal which is non-occlusive.  Dr. Marin Olp notified and order received for pt to continue on Aspirin 81 mg po daily and to start Eliquis 5 mg PO BID.  This RN spoke with pt.'s daughter, [name] and informed her of above.  [name] requests that Eliquis prescription be sent to the [facility] on [address] in [place] and has no questions at this time.

## 2019-10-02 ENCOUNTER — Other Ambulatory Visit: Payer: Self-pay | Admitting: *Deleted

## 2019-10-02 ENCOUNTER — Inpatient Hospital Stay: Payer: Medicare HMO

## 2019-10-02 ENCOUNTER — Other Ambulatory Visit: Payer: Self-pay

## 2019-10-02 VITALS — BP 149/72 | HR 65 | Temp 96.9°F | Resp 18

## 2019-10-02 DIAGNOSIS — Z86718 Personal history of other venous thrombosis and embolism: Secondary | ICD-10-CM | POA: Diagnosis not present

## 2019-10-02 DIAGNOSIS — D509 Iron deficiency anemia, unspecified: Secondary | ICD-10-CM | POA: Diagnosis not present

## 2019-10-02 DIAGNOSIS — D500 Iron deficiency anemia secondary to blood loss (chronic): Secondary | ICD-10-CM

## 2019-10-02 DIAGNOSIS — Z95828 Presence of other vascular implants and grafts: Secondary | ICD-10-CM

## 2019-10-02 DIAGNOSIS — K5900 Constipation, unspecified: Secondary | ICD-10-CM | POA: Diagnosis not present

## 2019-10-02 DIAGNOSIS — Z5112 Encounter for antineoplastic immunotherapy: Secondary | ICD-10-CM | POA: Diagnosis not present

## 2019-10-02 DIAGNOSIS — Z7952 Long term (current) use of systemic steroids: Secondary | ICD-10-CM | POA: Diagnosis not present

## 2019-10-02 DIAGNOSIS — Z8673 Personal history of transient ischemic attack (TIA), and cerebral infarction without residual deficits: Secondary | ICD-10-CM | POA: Diagnosis not present

## 2019-10-02 DIAGNOSIS — Z5111 Encounter for antineoplastic chemotherapy: Secondary | ICD-10-CM | POA: Diagnosis not present

## 2019-10-02 DIAGNOSIS — C189 Malignant neoplasm of colon, unspecified: Secondary | ICD-10-CM

## 2019-10-02 DIAGNOSIS — Z7901 Long term (current) use of anticoagulants: Secondary | ICD-10-CM | POA: Diagnosis not present

## 2019-10-02 DIAGNOSIS — C182 Malignant neoplasm of ascending colon: Secondary | ICD-10-CM | POA: Diagnosis not present

## 2019-10-02 MED ORDER — HEPARIN SOD (PORK) LOCK FLUSH 100 UNIT/ML IV SOLN
500.0000 [IU] | Freq: Once | INTRAVENOUS | Status: DC | PRN
  Filled 2019-10-02: qty 5

## 2019-10-02 MED ORDER — TRAMADOL HCL 50 MG PO TABS: 50.0000 mg | ORAL_TABLET | Freq: Four times a day (QID) | ORAL | 0 refills | Status: DC | PRN

## 2019-10-02 MED ORDER — SODIUM CHLORIDE 0.9% FLUSH
10.0000 mL | INTRAVENOUS | Status: DC | PRN
  Filled 2019-10-02: qty 10

## 2019-10-02 MED ORDER — SODIUM CHLORIDE 0.9% FLUSH
10.0000 mL | INTRAVENOUS | Status: DC | PRN
  Administered 2019-10-02: 10 mL
  Filled 2019-10-02: qty 10

## 2019-10-02 MED ORDER — SODIUM CHLORIDE 0.9 % IV SOLN
200.0000 mg | Freq: Once | INTRAVENOUS | Status: DC
  Administered 2019-10-02: 200 mg via INTRAVENOUS
  Filled 2019-10-02: qty 200

## 2019-10-02 MED ORDER — HEPARIN SOD (PORK) LOCK FLUSH 100 UNIT/ML IV SOLN
500.0000 [IU] | Freq: Once | INTRAVENOUS | Status: AC
  Administered 2019-10-02: 500 [IU] via INTRAVENOUS
  Filled 2019-10-02: qty 5

## 2019-10-02 MED ORDER — SODIUM CHLORIDE 0.9% FLUSH
10.0000 mL | Freq: Once | INTRAVENOUS | Status: AC
  Administered 2019-10-02: 10 mL via INTRAVENOUS
  Filled 2019-10-02: qty 10

## 2019-10-02 MED ORDER — SODIUM CHLORIDE 0.9 % IV SOLN
INTRAVENOUS | Status: DC
  Filled 2019-10-02: qty 250

## 2019-10-10 ENCOUNTER — Telehealth: Payer: Self-pay | Admitting: *Deleted

## 2019-10-10 NOTE — Telephone Encounter (Signed)
Call received from patient's daughter, [name] to notify Dr. Marin Olp that patient's left arm is swollen from her finger tips to above the elbow.  She states that the swelling started last night, there is no pain and that the pt is able to move her fingers and arm with no difficulties.  Dr. Marin Olp notified.  Call placed back to patient's daughter [name] and notified [name] per order of Dr. Marin Olp to elevate pt.'s left arm above her heart at all times and to call office back if swelling does not improve.  [name] appreciative of call back and has no further questions at this time.

## 2019-10-15 ENCOUNTER — Telehealth: Payer: Self-pay | Admitting: General Practice

## 2019-10-15 ENCOUNTER — Telehealth: Payer: Self-pay | Admitting: Endocrinology

## 2019-10-15 NOTE — Telephone Encounter (Signed)
I reviewed chart.  I do not see a condition that would qualify you.

## 2019-10-15 NOTE — Telephone Encounter (Signed)
Below is previously documented request for diabetic shoes. Called pt and informed her about the message below. Pt became upset stating she does qualify because she had a stroke. Advised that according to Medicare guidelines, she must have the following:  Foot ulcer Poor circulation Deformity of foot Amputation  Pt states "well I know I qualify", then proceeded to state "I will find me another doctor". Pt then disconnected. Routing to Dr. Loanne Drilling to make him aware.  Previous encounter: Administrator, [employer] sent another fax requesting Dr. Loanne Drilling complete CMN and Rx for diabetic shoes and inserts. As previously documented, per Dr. Loanne Drilling, pt does not qualify. Documents were not completed nor signed. However, did return fax indicating DOES NOT QUALIFY. Confirmation received.

## 2019-10-15 NOTE — Telephone Encounter (Signed)
Patient's daughter called to establish care with Dr. Larose Kells, explained to patient that Dr. Larose Kells is not accepting new patient at this time.

## 2019-10-15 NOTE — Telephone Encounter (Signed)
Patient's daughter called stating patient needs diabetic shoes and was wondering if she went through this office to get those? Patient would like to be called at [phone] to discuss. Gives permission to leave detailed message on here as well.

## 2019-10-21 ENCOUNTER — Inpatient Hospital Stay: Payer: Medicare HMO

## 2019-10-21 ENCOUNTER — Inpatient Hospital Stay (HOSPITAL_BASED_OUTPATIENT_CLINIC_OR_DEPARTMENT_OTHER): Payer: Medicare HMO | Admitting: Hematology & Oncology

## 2019-10-21 ENCOUNTER — Other Ambulatory Visit: Payer: Self-pay

## 2019-10-21 ENCOUNTER — Ambulatory Visit (HOSPITAL_BASED_OUTPATIENT_CLINIC_OR_DEPARTMENT_OTHER)
Admission: RE | Admit: 2019-10-21 | Discharge: 2019-10-21 | Disposition: A | Discharge status: Home / Self Care | Payer: Medicare HMO | Source: Ambulatory Visit | Attending: Hematology & Oncology | Admitting: Hematology & Oncology

## 2019-10-21 ENCOUNTER — Telehealth: Payer: Self-pay | Admitting: Hematology & Oncology

## 2019-10-21 ENCOUNTER — Encounter: Payer: Self-pay | Admitting: Hematology & Oncology

## 2019-10-21 VITALS — BP 141/84 | HR 72 | Temp 97.5°F | Resp 18 | Wt 181.1 lb

## 2019-10-21 DIAGNOSIS — D509 Iron deficiency anemia, unspecified: Secondary | ICD-10-CM | POA: Diagnosis not present

## 2019-10-21 DIAGNOSIS — Z8673 Personal history of transient ischemic attack (TIA), and cerebral infarction without residual deficits: Secondary | ICD-10-CM | POA: Diagnosis not present

## 2019-10-21 DIAGNOSIS — C189 Malignant neoplasm of colon, unspecified: Secondary | ICD-10-CM

## 2019-10-21 DIAGNOSIS — I82402 Acute embolism and thrombosis of unspecified deep veins of left lower extremity: Secondary | ICD-10-CM | POA: Diagnosis not present

## 2019-10-21 DIAGNOSIS — Z5112 Encounter for antineoplastic immunotherapy: Secondary | ICD-10-CM | POA: Diagnosis not present

## 2019-10-21 DIAGNOSIS — Z5111 Encounter for antineoplastic chemotherapy: Secondary | ICD-10-CM | POA: Diagnosis not present

## 2019-10-21 DIAGNOSIS — C787 Secondary malignant neoplasm of liver and intrahepatic bile duct: Secondary | ICD-10-CM

## 2019-10-21 DIAGNOSIS — Z86718 Personal history of other venous thrombosis and embolism: Secondary | ICD-10-CM | POA: Diagnosis not present

## 2019-10-21 DIAGNOSIS — D500 Iron deficiency anemia secondary to blood loss (chronic): Secondary | ICD-10-CM

## 2019-10-21 DIAGNOSIS — K5900 Constipation, unspecified: Secondary | ICD-10-CM | POA: Diagnosis not present

## 2019-10-21 DIAGNOSIS — C187 Malignant neoplasm of sigmoid colon: Secondary | ICD-10-CM | POA: Diagnosis not present

## 2019-10-21 DIAGNOSIS — Z7952 Long term (current) use of systemic steroids: Secondary | ICD-10-CM | POA: Diagnosis not present

## 2019-10-21 DIAGNOSIS — Z7901 Long term (current) use of anticoagulants: Secondary | ICD-10-CM | POA: Diagnosis not present

## 2019-10-21 DIAGNOSIS — C182 Malignant neoplasm of ascending colon: Secondary | ICD-10-CM | POA: Diagnosis not present

## 2019-10-21 LAB — CBC WITH DIFFERENTIAL (CANCER CENTER ONLY)
Abs Immature Granulocytes: 0.03 10*3/uL (ref 0.00–0.07)
Basophils Absolute: 0 10*3/uL (ref 0.0–0.1)
Basophils Relative: 0 %
Eosinophils Absolute: 0.1 10*3/uL (ref 0.0–0.5)
Eosinophils Relative: 1 %
HCT: 36 % (ref 36.0–46.0)
Hemoglobin: 11 g/dL — ABNORMAL LOW (ref 12.0–15.0)
Immature Granulocytes: 1 %
Lymphocytes Relative: 25 %
Lymphs Abs: 1.3 10*3/uL (ref 0.7–4.0)
MCH: 28.2 pg (ref 26.0–34.0)
MCHC: 30.6 g/dL (ref 30.0–36.0)
MCV: 92.3 fL (ref 80.0–100.0)
Monocytes Absolute: 0.6 10*3/uL (ref 0.1–1.0)
Monocytes Relative: 12 %
Neutro Abs: 3.1 10*3/uL (ref 1.7–7.7)
Neutrophils Relative %: 61 %
Platelet Count: 204 10*3/uL (ref 150–400)
RBC: 3.9 MIL/uL (ref 3.87–5.11)
RDW: 15.3 % (ref 11.5–15.5)
WBC Count: 5.2 10*3/uL (ref 4.0–10.5)
nRBC: 0 % (ref 0.0–0.2)

## 2019-10-21 LAB — CMP (CANCER CENTER ONLY)
ALT: 16 U/L (ref 0–44)
AST: 15 U/L (ref 15–41)
Albumin: 3.5 g/dL (ref 3.5–5.0)
Alkaline Phosphatase: 70 U/L (ref 38–126)
Anion gap: 6 (ref 5–15)
BUN: 12 mg/dL (ref 8–23)
CO2: 31 mmol/L (ref 22–32)
Calcium: 9.4 mg/dL (ref 8.9–10.3)
Chloride: 106 mmol/L (ref 98–111)
Creatinine: 0.74 mg/dL (ref 0.44–1.00)
GFR, Est AFR Am: >60 mL/min (ref >60)
GFR, Estimated: >60 mL/min (ref >60)
Glucose, Bld: 197 mg/dL — ABNORMAL HIGH (ref 70–99)
Potassium: 3.8 mmol/L (ref 3.5–5.1)
Sodium: 143 mmol/L (ref 135–145)
Total Bilirubin: 0.6 mg/dL (ref 0.3–1.2)
Total Protein: 5.5 g/dL — ABNORMAL LOW (ref 6.5–8.1)

## 2019-10-21 MED ORDER — FLUOROURACIL CHEMO INJECTION 2.5 GM/50ML
320.0000 mg/m2 | Freq: Once | INTRAVENOUS | Status: AC
  Administered 2019-10-21: 650 mg via INTRAVENOUS
  Filled 2019-10-21: qty 13

## 2019-10-21 MED ORDER — SODIUM CHLORIDE 0.9 % IV SOLN
Freq: Once | INTRAVENOUS | Status: AC
  Filled 2019-10-21: qty 250

## 2019-10-21 MED ORDER — SODIUM CHLORIDE 0.9 % IV SOLN
1920.0000 mg/m2 | INTRAVENOUS | Status: DC
  Administered 2019-10-21: 3750 mg via INTRAVENOUS
  Filled 2019-10-21: qty 75

## 2019-10-21 MED ORDER — SODIUM CHLORIDE 0.9 % IV SOLN
135.0000 mg/m2 | Freq: Once | INTRAVENOUS | Status: AC
  Administered 2019-10-21: 260 mg via INTRAVENOUS
  Filled 2019-10-21: qty 8

## 2019-10-21 MED ORDER — SODIUM CHLORIDE 0.9 % IV SOLN
400.0000 mg/m2 | Freq: Once | INTRAVENOUS | Status: AC
  Administered 2019-10-21: 784 mg via INTRAVENOUS
  Filled 2019-10-21: qty 39.2

## 2019-10-21 MED ORDER — PALONOSETRON HCL INJECTION 0.25 MG/5ML
INTRAVENOUS | Status: AC
  Filled 2019-10-21: qty 5

## 2019-10-21 MED ORDER — ATROPINE SULFATE 1 MG/ML IJ SOLN
INTRAMUSCULAR | Status: AC
  Filled 2019-10-21: qty 1

## 2019-10-21 MED ORDER — PALONOSETRON HCL INJECTION 0.25 MG/5ML
0.2500 mg | Freq: Once | INTRAVENOUS | Status: AC
  Administered 2019-10-21: 0.25 mg via INTRAVENOUS

## 2019-10-21 MED ORDER — SODIUM CHLORIDE 0.9 % IV SOLN
10.0000 mg | Freq: Once | INTRAVENOUS | Status: AC
  Administered 2019-10-21: 10 mg via INTRAVENOUS
  Filled 2019-10-21: qty 10

## 2019-10-21 MED ORDER — ATROPINE SULFATE 1 MG/ML IJ SOLN
0.5000 mg | Freq: Once | INTRAMUSCULAR | Status: AC | PRN
  Administered 2019-10-21: 0.5 mg via INTRAVENOUS

## 2019-10-21 NOTE — Progress Notes (Signed)
Hematology and Oncology Follow Up Visit  Tonya [name] [id number] 09/12/1940 79 y.o. 10/21/2019   Principle Diagnosis:  Stage IV adenocarcinoma of the ascending colon -- NRAS+, BRAF-, HER2-, MSI/MMR -,  Iron deficiency anemia DVT in LEFT leg--  Peroneal and popliteal vein  Past Therapy: FOLFOX - s/p cycle 5  Current Therapy: FOLFIRI/Bevacizumab -started 01/20/2019,s/p cycle 13 -- Avastin d/c'ed on 10/21/2019 due to DVT IV Iron as indicated Eliquis 5 mg po BID -- started on 09/30/2019   Interim History:  Tonya [name] is here today with her daughter for follow-up and treatment.  Unfortunately, the last time she was here, she was found to have a blood clot in the left leg.  She had a Doppler done.  This showed a thrombus that was nonocclusive in the popliteal and peroneal veins in the left leg.  She was started on Eliquis.  She has been on Eliquis for 3 weeks.  Unfortunately, her left leg looks more swollen.  I does have to wonder if she does not have some progression and that she is not going to respond to Eliquis.  She is quite diligent with taking her medications.  Her daughter makes sure of that.  If we find that she has propagation of her thrombus, we will have to get her on Lovenox.  I just hate that she needs Lovenox but we might need to do this for affect.  We can have to stop the Avastin.  I am sure this probably is a factor in the thromboembolic disease.  She also has weakness on the left side from a past stroke.  Currently, she is responding to treatment.  Her last CEA was 4.71.  Her appetite is doing okay.  She has had a little bit of nausea but no vomiting.  She has had no diarrhea.  Overall, her performance status is ECOG 1.  Medications:  Allergies as of 10/21/2019      Reactions   Hydrocodone Swelling, Other (See Comments)   "I started swelling, became red, and passed out"   Iohexol Hives, Shortness Of Breath, Other (See Comments)   Patient developed hives  and fullness in throat post injection of 125cc's Omni 300, Onset Date: 11/15/2006   Naproxen Other (See Comments)   "It made me feel out of my head"   Ibuprofen Nausea Only   Propoxyphene N-acetaminophen Other (See Comments)   "I couldn't find the door to make my way out of the room- I was in misery"      Medication List       Accurate as of October 21, 2019 10:46 AM. If you have any questions, ask your nurse or doctor.        amLODipine 10 MG tablet Commonly known as: NORVASC Take 1 tablet (10 mg total) by mouth daily.   apixaban 5 MG Tabs tablet Commonly known as: Eliquis Take 1 tablet (5 mg total) by mouth 2 (two) times daily.   aspirin EC 81 MG tablet Take 81 mg by mouth daily.   atorvastatin 10 MG tablet Commonly known as: LIPITOR Take 1 tablet (10 mg total) by mouth daily.   B-D SINGLE USE SWABS REGULAR Pads 1 each by Does not apply route as needed (to cleanse site prior to obtaining droplet of blood for CBG's). E11.9   B-D ULTRAFINE III SHORT PEN 31G X 8 MM Misc Generic drug: Insulin Pen Needle   Cholecalciferol 50 MCG (2000 UT) Tabs Take 1 tablet (2,000 Units total) by mouth daily.  dexamethasone 4 MG tablet Commonly known as: DECADRON Take 2 tablets (8 mg total) by mouth 2 (two) times daily. For four days following chemotherapy.   famotidine 40 MG tablet Commonly known as: Pepcid Take 1 tablet (40 mg total) by mouth 2 (two) times daily.   lidocaine-prilocaine cream Commonly known as: EMLA   loperamide 2 MG tablet Commonly known as: Imodium A-D Take 2 tablets (4 mg total) by mouth 4 (four) times daily as needed. Take 2 at diarrhea onset , then 1 every 2hr until 12hrs with no BM. May take 2 every 4hrs at night. If diarrhea recurs repeat.   magic mouthwash Soln Swish and swallow equal parts Benadryl, Lidocaine and Nystatin.  5- 10 ml.'s four times a day as needed.   meclizine 12.5 MG tablet Commonly known as: ANTIVERT TAKE 1 TABLET(12.5 MG) BY MOUTH  THREE TIMES DAILY AS NEEDED FOR DIZZINESS   metoprolol tartrate 25 MG tablet Commonly known as: LOPRESSOR Take 1 tablet (25 mg total) by mouth 2 (two) times daily.   olmesartan 20 MG tablet Commonly known as: BENICAR Take 1 tablet (20 mg total) by mouth daily.   temazepam 7.5 MG capsule Commonly known as: Restoril Take 1 capsule (7.5 mg total) by mouth at bedtime as needed for sleep.   traMADol 50 MG tablet Commonly known as: ULTRAM Take 1 tablet (50 mg total) by mouth every 6 (six) hours as needed.   Tyler Aas FlexTouch 100 UNIT/ML FlexTouch Pen Generic drug: insulin degludec Inject 0.21 mLs (21 Units total) into the skin daily. Take 34 units on chemo days   True Metrix Blood Glucose Test test strip Generic drug: glucose blood 1 each by Other route 2 (two) times daily. E11.9   TRUEplus Lancets 30G Misc 1 each by Does not apply route 2 (two) times daily. E11.9       Allergies:  Allergies  Allergen Reactions  . Hydrocodone Swelling and Other (See Comments)    "I started swelling, became red, and passed out"  . Iohexol Hives, Shortness Of Breath and Other (See Comments)    Patient developed hives and fullness in throat post injection of 125cc's Omni 300, Onset Date: 11/15/2006   . Naproxen Other (See Comments)    "It made me feel out of my head"  . Ibuprofen Nausea Only  . Propoxyphene N-Acetaminophen Other (See Comments)    "I couldn't find the door to make my way out of the room- I was in misery"    Past Medical History, Surgical history, Social history, and Family History were reviewed and updated.  Review of Systems: Review of Systems  Constitutional: Negative.   HENT: Negative.   Eyes: Negative.   Cardiovascular: Positive for leg swelling.  Gastrointestinal: Positive for nausea.  Genitourinary: Negative.   Musculoskeletal: Positive for back pain and neck pain.  Skin: Negative.   Neurological: Negative.   Endo/Heme/Allergies: Negative.    Psychiatric/Behavioral: Negative.      Physical Exam:  weight is 181 lb 1.9 oz (82.2 kg). Her temporal temperature is 97.5 F (36.4 C) (abnormal). Her blood pressure is 141/84 (abnormal) and her pulse is 72. Her respiration is 18 and oxygen saturation is 98%.   Wt Readings from Last 3 Encounters:  10/21/19 181 lb 1.9 oz (82.2 kg)  10/21/19 181 lb 1.9 oz (82.2 kg)  09/19/19 187 lb (84.8 kg)    Physical Exam Vitals reviewed.  HENT:     Head: Normocephalic and atraumatic.  Eyes:     Pupils: Pupils  are equal, round, and reactive to light.  Cardiovascular:     Rate and Rhythm: Normal rate and regular rhythm.     Heart sounds: Normal heart sounds.  Pulmonary:     Effort: Pulmonary effort is normal.     Breath sounds: Normal breath sounds.  Abdominal:     General: Bowel sounds are normal.     Palpations: Abdomen is soft.  Musculoskeletal:        General: No tenderness or deformity. Normal range of motion.     Cervical back: Normal range of motion.     Comments: Extremities shows swelling in the left leg.  There is little bit of discoloration of the left leg.  She has good warmth of the left leg.  I really cannot palpate any obvious venous cord.  Right leg is unremarkable.  Lymphadenopathy:     Cervical: No cervical adenopathy.  Skin:    General: Skin is warm and dry.     Findings: No erythema or rash.  Neurological:     Mental Status: She is alert and oriented to person, place, and time.  Psychiatric:        Behavior: Behavior normal.        Thought Content: Thought content normal.        Judgment: Judgment normal.      Lab Results  Component Value Date   WBC 5.2 10/21/2019   HGB 11.0 (L) 10/21/2019   HCT 36.0 10/21/2019   MCV 92.3 10/21/2019   PLT 204 10/21/2019   Lab Results  Component Value Date   FERRITIN 306 09/30/2019   IRON 41 09/30/2019   TIBC 294 09/30/2019   UIBC 253 09/30/2019   IRONPCTSAT 14 (L) 09/30/2019   Lab Results  Component Value Date    RETICCTPCT 1.5 05/17/2016   RBC 3.90 10/21/2019   RETICCTABS 62,400 05/17/2016   No results found for: KPAFRELGTCHN, LAMBDASER, KAPLAMBRATIO No results found for: IGGSERUM, IGA, IGMSERUM No results found for: Odetta Pink, SPEI   Chemistry      Component Value Date/Time   NA 141 09/30/2019 1022   NA 139 09/30/2018 1311   K 4.0 09/30/2019 1022   CL 105 09/30/2019 1022   CO2 29 09/30/2019 1022   BUN 15 09/30/2019 1022   BUN 10 09/30/2018 1311   CREATININE 0.85 09/30/2019 1022      Component Value Date/Time   CALCIUM 9.6 09/30/2019 1022   ALKPHOS 65 09/30/2019 1022   AST 16 09/30/2019 1022   ALT 14 09/30/2019 1022   BILITOT 0.7 09/30/2019 1022       Impression and Plan: Tonya [name] is a very pleasant 79 yo African American female with metastatic colon cancer.  I just had the fact that we have to stop the Avastin.  I do think that this probably was helping [place] with respect to her treatment and response.  We will have to get another Doppler of her leg today.  If the Doppler shows that there is thrombus is propagating, we will have to go ahead and get her on Lovenox.  I would probably give her Lovenox twice a day.  I would dose her at 80 mg subcu twice a day.  Again I have stay on top of all this.  Again we can stop the Avastin.  I hope that this does not affect, adversely, her response to treatment.  This is pretty complicated.  I spent about 30 minutes or so with  Tonya [name] and her daughter.  We will have to get her back to see [place] in another 3 weeks.    [name], MD 6/29/202110:46 AM

## 2019-10-21 NOTE — Telephone Encounter (Signed)
Appointments scheduled calendar printed per 6/29 los 

## 2019-10-22 LAB — CEA (IN HOUSE-CHCC): CEA (CHCC-In House): 5.14 ng/mL — ABNORMAL HIGH (ref 0.00–5.00)

## 2019-10-22 LAB — IRON AND TIBC
Iron: 46 ug/dL (ref 41–142)
Saturation Ratios: 19 % — ABNORMAL LOW (ref 21–57)
TIBC: 248 ug/dL (ref 236–444)
UIBC: 202 ug/dL (ref 120–384)

## 2019-10-22 LAB — FERRITIN: Ferritin: 302 ng/mL (ref 11–307)

## 2019-10-23 ENCOUNTER — Other Ambulatory Visit: Payer: Self-pay

## 2019-10-23 ENCOUNTER — Ambulatory Visit (HOSPITAL_BASED_OUTPATIENT_CLINIC_OR_DEPARTMENT_OTHER)
Admission: RE | Admit: 2019-10-23 | Discharge: 2019-10-23 | Disposition: A | Discharge status: Home / Self Care | Payer: Medicare HMO | Source: Ambulatory Visit | Attending: Hematology & Oncology | Admitting: Hematology & Oncology

## 2019-10-23 ENCOUNTER — Inpatient Hospital Stay: Payer: Medicare HMO | Attending: Hematology & Oncology

## 2019-10-23 VITALS — BP 145/82 | HR 65 | Temp 97.7°F | Resp 18

## 2019-10-23 DIAGNOSIS — Z86718 Personal history of other venous thrombosis and embolism: Secondary | ICD-10-CM | POA: Insufficient documentation

## 2019-10-23 DIAGNOSIS — Z7952 Long term (current) use of systemic steroids: Secondary | ICD-10-CM | POA: Diagnosis not present

## 2019-10-23 DIAGNOSIS — Z5111 Encounter for antineoplastic chemotherapy: Secondary | ICD-10-CM | POA: Diagnosis not present

## 2019-10-23 DIAGNOSIS — Z7901 Long term (current) use of anticoagulants: Secondary | ICD-10-CM | POA: Diagnosis not present

## 2019-10-23 DIAGNOSIS — C182 Malignant neoplasm of ascending colon: Secondary | ICD-10-CM | POA: Diagnosis not present

## 2019-10-23 DIAGNOSIS — C189 Malignant neoplasm of colon, unspecified: Secondary | ICD-10-CM

## 2019-10-23 DIAGNOSIS — D509 Iron deficiency anemia, unspecified: Secondary | ICD-10-CM | POA: Insufficient documentation

## 2019-10-23 DIAGNOSIS — Z7982 Long term (current) use of aspirin: Secondary | ICD-10-CM | POA: Diagnosis not present

## 2019-10-23 DIAGNOSIS — Z79899 Other long term (current) drug therapy: Secondary | ICD-10-CM | POA: Insufficient documentation

## 2019-10-23 DIAGNOSIS — I82402 Acute embolism and thrombosis of unspecified deep veins of left lower extremity: Secondary | ICD-10-CM | POA: Diagnosis not present

## 2019-10-23 DIAGNOSIS — C787 Secondary malignant neoplasm of liver and intrahepatic bile duct: Secondary | ICD-10-CM

## 2019-10-23 MED ORDER — HEPARIN SOD (PORK) LOCK FLUSH 100 UNIT/ML IV SOLN
500.0000 [IU] | Freq: Once | INTRAVENOUS | Status: AC | PRN
  Administered 2019-10-23: 500 [IU]
  Filled 2019-10-23: qty 5

## 2019-10-23 MED ORDER — SODIUM CHLORIDE 0.9% FLUSH
10.0000 mL | INTRAVENOUS | Status: DC | PRN
  Administered 2019-10-23: 10 mL
  Filled 2019-10-23: qty 10

## 2019-10-24 ENCOUNTER — Telehealth: Payer: Self-pay | Admitting: *Deleted

## 2019-10-24 ENCOUNTER — Other Ambulatory Visit: Payer: Self-pay | Admitting: *Deleted

## 2019-10-24 DIAGNOSIS — I82402 Acute embolism and thrombosis of unspecified deep veins of left lower extremity: Secondary | ICD-10-CM

## 2019-10-24 MED ORDER — APIXABAN 5 MG PO TABS: 5.0000 mg | ORAL_TABLET | Freq: Two times a day (BID) | ORAL | 0 refills | Status: DC

## 2019-10-24 NOTE — Telephone Encounter (Signed)
I called and spoke to [name], daughter, regarding the [place] of leg. Per Laverna Peace, NP, the blood clot is stable, and there is blood flow. There will be swelling in that leg because the stroke was on that side. Informed [name] that she needs to keep both legs elevated when she is sitting down. [name] verbalized understanding.

## 2019-10-29 ENCOUNTER — Telehealth: Payer: Self-pay | Admitting: *Deleted

## 2019-10-29 DIAGNOSIS — C187 Malignant neoplasm of sigmoid colon: Secondary | ICD-10-CM | POA: Diagnosis not present

## 2019-10-29 NOTE — Telephone Encounter (Signed)
-----   Message from [name], MD sent at 10/28/2019 10:01 AM EDT ----- Call - the blood clot is not larger.  Laurey Arrow

## 2019-10-29 NOTE — Telephone Encounter (Signed)
As noted below by Dr. Marin Olp, I informed the patient that the blood clot is not larger. She verbalized understanding.

## 2019-10-31 ENCOUNTER — Telehealth: Payer: Self-pay | Admitting: *Deleted

## 2019-10-31 ENCOUNTER — Ambulatory Visit: Payer: Medicare HMO | Admitting: Cardiology

## 2019-10-31 ENCOUNTER — Other Ambulatory Visit: Payer: Self-pay | Admitting: *Deleted

## 2019-10-31 DIAGNOSIS — R42 Dizziness and giddiness: Secondary | ICD-10-CM

## 2019-10-31 MED ORDER — MECLIZINE HCL 12.5 MG PO TABS: ORAL_TABLET | ORAL | 2 refills | Status: DC

## 2019-10-31 NOTE — Telephone Encounter (Signed)
Returned daughters phone call, [name], regarding lab results and patient being dizzy. I reviewed labs with her, instructed her to increase her fluid intake, told her she doesn't need blood or iron transfusions, and to take Meclizine for dizziness. Meclizine refill sent to her pharmacy. She verbalized understanding.

## 2019-11-04 DIAGNOSIS — D6859 Other primary thrombophilia: Secondary | ICD-10-CM | POA: Diagnosis not present

## 2019-11-04 DIAGNOSIS — E785 Hyperlipidemia, unspecified: Secondary | ICD-10-CM | POA: Diagnosis not present

## 2019-11-04 DIAGNOSIS — D500 Iron deficiency anemia secondary to blood loss (chronic): Secondary | ICD-10-CM | POA: Diagnosis not present

## 2019-11-04 DIAGNOSIS — C189 Malignant neoplasm of colon, unspecified: Secondary | ICD-10-CM | POA: Diagnosis not present

## 2019-11-04 DIAGNOSIS — E1169 Type 2 diabetes mellitus with other specified complication: Secondary | ICD-10-CM | POA: Diagnosis not present

## 2019-11-04 DIAGNOSIS — C787 Secondary malignant neoplasm of liver and intrahepatic bile duct: Secondary | ICD-10-CM | POA: Diagnosis not present

## 2019-11-04 DIAGNOSIS — Z8673 Personal history of transient ischemic attack (TIA), and cerebral infarction without residual deficits: Secondary | ICD-10-CM | POA: Diagnosis not present

## 2019-11-04 DIAGNOSIS — I10 Essential (primary) hypertension: Secondary | ICD-10-CM | POA: Diagnosis not present

## 2019-11-04 DIAGNOSIS — Z794 Long term (current) use of insulin: Secondary | ICD-10-CM | POA: Diagnosis not present

## 2019-11-12 ENCOUNTER — Other Ambulatory Visit: Payer: Self-pay

## 2019-11-12 ENCOUNTER — Inpatient Hospital Stay (HOSPITAL_BASED_OUTPATIENT_CLINIC_OR_DEPARTMENT_OTHER): Payer: Medicare HMO | Admitting: Family

## 2019-11-12 ENCOUNTER — Inpatient Hospital Stay: Payer: Medicare HMO

## 2019-11-12 ENCOUNTER — Encounter: Payer: Self-pay | Admitting: Family

## 2019-11-12 VITALS — BP 133/77 | HR 85 | Temp 97.9°F | Resp 18 | Wt 180.0 lb

## 2019-11-12 VITALS — BP 138/85 | HR 86 | Resp 18

## 2019-11-12 DIAGNOSIS — D500 Iron deficiency anemia secondary to blood loss (chronic): Secondary | ICD-10-CM

## 2019-11-12 DIAGNOSIS — Z7901 Long term (current) use of anticoagulants: Secondary | ICD-10-CM | POA: Diagnosis not present

## 2019-11-12 DIAGNOSIS — C787 Secondary malignant neoplasm of liver and intrahepatic bile duct: Secondary | ICD-10-CM

## 2019-11-12 DIAGNOSIS — C189 Malignant neoplasm of colon, unspecified: Secondary | ICD-10-CM | POA: Diagnosis not present

## 2019-11-12 DIAGNOSIS — Z86718 Personal history of other venous thrombosis and embolism: Secondary | ICD-10-CM | POA: Diagnosis not present

## 2019-11-12 DIAGNOSIS — Z79899 Other long term (current) drug therapy: Secondary | ICD-10-CM | POA: Diagnosis not present

## 2019-11-12 DIAGNOSIS — D509 Iron deficiency anemia, unspecified: Secondary | ICD-10-CM | POA: Diagnosis not present

## 2019-11-12 DIAGNOSIS — Z7982 Long term (current) use of aspirin: Secondary | ICD-10-CM | POA: Diagnosis not present

## 2019-11-12 DIAGNOSIS — Z7952 Long term (current) use of systemic steroids: Secondary | ICD-10-CM | POA: Diagnosis not present

## 2019-11-12 DIAGNOSIS — I82402 Acute embolism and thrombosis of unspecified deep veins of left lower extremity: Secondary | ICD-10-CM

## 2019-11-12 DIAGNOSIS — C182 Malignant neoplasm of ascending colon: Secondary | ICD-10-CM | POA: Diagnosis not present

## 2019-11-12 DIAGNOSIS — Z5111 Encounter for antineoplastic chemotherapy: Secondary | ICD-10-CM | POA: Diagnosis not present

## 2019-11-12 LAB — CMP (CANCER CENTER ONLY)
ALT: 13 U/L (ref 0–44)
AST: 16 U/L (ref 15–41)
Albumin: 3.6 g/dL (ref 3.5–5.0)
Alkaline Phosphatase: 71 U/L (ref 38–126)
Anion gap: 7 (ref 5–15)
BUN: 9 mg/dL (ref 8–23)
CO2: 29 mmol/L (ref 22–32)
Calcium: 9.7 mg/dL (ref 8.9–10.3)
Chloride: 108 mmol/L (ref 98–111)
Creatinine: 0.63 mg/dL (ref 0.44–1.00)
GFR, Est AFR Am: >60 mL/min (ref >60)
GFR, Estimated: >60 mL/min (ref >60)
Glucose, Bld: 177 mg/dL — ABNORMAL HIGH (ref 70–99)
Potassium: 3.6 mmol/L (ref 3.5–5.1)
Sodium: 144 mmol/L (ref 135–145)
Total Bilirubin: 0.6 mg/dL (ref 0.3–1.2)
Total Protein: 5.8 g/dL — ABNORMAL LOW (ref 6.5–8.1)

## 2019-11-12 LAB — CBC WITH DIFFERENTIAL (CANCER CENTER ONLY)
Abs Immature Granulocytes: 0.06 10*3/uL (ref 0.00–0.07)
Basophils Absolute: 0 10*3/uL (ref 0.0–0.1)
Basophils Relative: 0 %
Eosinophils Absolute: 0.1 10*3/uL (ref 0.0–0.5)
Eosinophils Relative: 1 %
HCT: 35.5 % — ABNORMAL LOW (ref 36.0–46.0)
Hemoglobin: 10.8 g/dL — ABNORMAL LOW (ref 12.0–15.0)
Immature Granulocytes: 1 %
Lymphocytes Relative: 21 %
Lymphs Abs: 1.5 10*3/uL (ref 0.7–4.0)
MCH: 27.6 pg (ref 26.0–34.0)
MCHC: 30.4 g/dL (ref 30.0–36.0)
MCV: 90.6 fL (ref 80.0–100.0)
Monocytes Absolute: 0.8 10*3/uL (ref 0.1–1.0)
Monocytes Relative: 11 %
Neutro Abs: 4.5 10*3/uL (ref 1.7–7.7)
Neutrophils Relative %: 66 %
Platelet Count: 227 10*3/uL (ref 150–400)
RBC: 3.92 MIL/uL (ref 3.87–5.11)
RDW: 15.7 % — ABNORMAL HIGH (ref 11.5–15.5)
WBC Count: 6.9 10*3/uL (ref 4.0–10.5)
nRBC: 0 % (ref 0.0–0.2)

## 2019-11-12 LAB — FERRITIN: Ferritin: 236 ng/mL (ref 11–307)

## 2019-11-12 LAB — IRON AND TIBC
Iron: 35 ug/dL — ABNORMAL LOW (ref 41–142)
Saturation Ratios: 14 % — ABNORMAL LOW (ref 21–57)
TIBC: 243 ug/dL (ref 236–444)
UIBC: 207 ug/dL (ref 120–384)

## 2019-11-12 LAB — CEA (IN HOUSE-CHCC): CEA (CHCC-In House): 4.61 ng/mL (ref 0.00–5.00)

## 2019-11-12 MED ORDER — SODIUM CHLORIDE 0.9% FLUSH
10.0000 mL | INTRAVENOUS | Status: DC | PRN
  Administered 2019-11-12: 10 mL
  Filled 2019-11-12: qty 10

## 2019-11-12 MED ORDER — SODIUM CHLORIDE 0.9 % IV SOLN
INTRAVENOUS | Status: DC
  Filled 2019-11-12 (×2): qty 250

## 2019-11-12 MED ORDER — SODIUM CHLORIDE 0.9 % IV SOLN
200.0000 mg | Freq: Once | INTRAVENOUS | Status: AC
  Administered 2019-11-12: 200 mg via INTRAVENOUS
  Filled 2019-11-12: qty 10

## 2019-11-12 MED ORDER — HEPARIN SOD (PORK) LOCK FLUSH 100 UNIT/ML IV SOLN
500.0000 [IU] | Freq: Once | INTRAVENOUS | Status: AC | PRN
  Administered 2019-11-12: 500 [IU]
  Filled 2019-11-12: qty 5

## 2019-11-12 NOTE — Patient Instructions (Signed)
Iron Sucrose injection What is this medicine? IRON SUCROSE (AHY ern SOO krohs) is an iron complex. Iron is used to make healthy red blood cells, which carry oxygen and nutrients throughout the body. This medicine is used to treat iron deficiency anemia in people with chronic kidney disease. This medicine may be used for other purposes; ask your health care provider or pharmacist if you have questions. COMMON BRAND NAME(S): Venofer What should I tell my health care provider before I take this medicine? They need to know if you have any of these conditions:  anemia not caused by low iron levels  heart disease  high levels of iron in the blood  kidney disease  liver disease  an unusual or allergic reaction to iron, other medicines, foods, dyes, or preservatives  pregnant or trying to get pregnant  breast-feeding How should I use this medicine? This medicine is for infusion into a vein. It is given by a health care professional in a hospital or clinic setting. Talk to your pediatrician regarding the use of this medicine in children. While this drug may be prescribed for children as young as 2 years for selected conditions, precautions do apply. Overdosage: If you think you have taken too much of this medicine contact a poison control center or emergency room at once. NOTE: This medicine is only for you. Do not share this medicine with others. What if I miss a dose? It is important not to miss your dose. Call your doctor or health care professional if you are unable to keep an appointment. What may interact with this medicine? Do not take this medicine with any of the following medications:  deferoxamine  dimercaprol  other iron products This medicine may also interact with the following medications:  chloramphenicol  deferasirox This list may not describe all possible interactions. Give your health care provider a list of all the medicines, herbs, non-prescription drugs, or  dietary supplements you use. Also tell them if you smoke, drink alcohol, or use illegal drugs. Some items may interact with your medicine. What should I watch for while using this medicine? Visit your doctor or healthcare professional regularly. Tell your doctor or healthcare professional if your symptoms do not start to get better or if they get worse. You may need blood work done while you are taking this medicine. You may need to follow a special diet. Talk to your doctor. Foods that contain iron include: whole grains/cereals, dried fruits, beans, or peas, leafy green vegetables, and organ meats (liver, kidney). What side effects may I notice from receiving this medicine? Side effects that you should report to your doctor or health care professional as soon as possible:  allergic reactions like skin rash, itching or hives, swelling of the face, lips, or tongue  breathing problems  changes in blood pressure  cough  fast, irregular heartbeat  feeling faint or lightheaded, falls  fever or chills  flushing, sweating, or hot feelings  joint or muscle aches/pains  seizures  swelling of the ankles or feet  unusually weak or tired Side effects that usually do not require medical attention (report to your doctor or health care professional if they continue or are bothersome):  diarrhea  feeling achy  headache  irritation at site where injected  nausea, vomiting  stomach upset  tiredness This list may not describe all possible side effects. Call your doctor for medical advice about side effects. You may report side effects to FDA at 1-800-FDA-1088. Where should I keep   my medicine? This drug is given in a hospital or clinic and will not be stored at home. NOTE: This sheet is a summary. It may not cover all possible information. If you have questions about this medicine, talk to your doctor, pharmacist, or health care provider.  2020 Elsevier/Gold Standard (2011-01-19  17:14:35)   Rehydration, Adult Rehydration is the replacement of body fluids and salts and minerals (electrolytes) that are lost during dehydration. Dehydration is when there is not enough fluid or water in the body. This happens when you lose more fluids than you take in. Common causes of dehydration include:  Vomiting.  Diarrhea.  Excessive sweating, such as from heat exposure or exercise.  Taking medicines that cause the body to lose excess fluid (diuretics).  Impaired kidney function.  Not drinking enough fluid.  Certain illnesses or infections.  Certain poorly controlled long-term (chronic) illnesses, such as diabetes, heart disease, and kidney disease.  Symptoms of mild dehydration may include thirst, dry lips and mouth, dry skin, and dizziness. Symptoms of severe dehydration may include increased heart rate, confusion, fainting, and not urinating. You can rehydrate by drinking certain fluids or getting fluids through an IV tube, as told by your health care provider. What are the risks? Generally, rehydration is safe. However, one problem that can happen is taking in too much fluid (overhydration). This is rare. If overhydration happens, it can cause an electrolyte imbalance, kidney failure, or a decrease in salt (sodium) levels in the body. How to rehydrate Follow instructions from your health care provider for rehydration. The kind of fluid you should drink and the amount you should drink depend on your condition.  If directed by your health care provider, drink an oral rehydration solution (ORS). This is a drink designed to treat dehydration that is found in pharmacies and retail stores. ? Make an ORS by following instructions on the package. ? Start by drinking small amounts, about  cup (120 mL) every 5-10 minutes. ? Slowly increase how much you drink until you have taken the amount recommended by your health care provider.  Drink enough clear fluids to keep your urine  clear or pale yellow. If you were instructed to drink an ORS, finish the ORS first, then start slowly drinking other clear fluids. Drink fluids such as: ? Water. Do not drink only water. Doing that can lead to having too little sodium in your body (hyponatremia). ? Ice chips. ? Fruit juice that you have added water to (diluted juice). ? Low-calorie sports drinks.  If you are severely dehydrated, your health care provider may recommend that you receive fluids through an IV tube in the hospital.  Do not take sodium tablets. Doing that can lead to the condition of having too much sodium in your body (hypernatremia). Eating while you rehydrate Follow instructions from your health care provider about what to eat while you rehydrate. Your health care provider may recommend that you slowly begin eating regular foods in small amounts.  Eat foods that contain a healthy balance of electrolytes, such as bananas, oranges, potatoes, tomatoes, and spinach.  Avoid foods that are greasy or contain a lot of fat or sugar.  In some cases, you may get nutrition through a feeding tube that is passed through your nose and into your stomach (nasogastric tube, or NG tube). This may be done if you have uncontrolled vomiting or diarrhea. Beverages to avoid Certain beverages may make dehydration worse. While you rehydrate, avoid:  Alcohol.  Caffeine.  Drinks   that contain a lot of sugar. These include: ? High-calorie sports drinks. ? Fruit juice that is not diluted. ? Soda.  Check nutrition labels to see how much sugar or caffeine a beverage contains. Signs of dehydration recovery You may be recovering from dehydration if:  You are urinating more often than before you started rehydrating.  Your urine is clear or pale yellow.  Your energy level improves.  You vomit less frequently.  You have diarrhea less frequently.  Your appetite improves or returns to normal.  You feel less dizzy or less  light-headed.  Your skin tone and color start to look more normal. Contact a health care provider if:  You continue to have symptoms of mild dehydration, such as: ? Thirst. ? Dry lips. ? Slightly dry mouth. ? Dry, warm skin. ? Dizziness.  You continue to vomit or have diarrhea. Get help right away if:  You have symptoms of dehydration that get worse.  You feel: ? Confused. ? Weak. ? Like you are going to faint.  You have not urinated in 6-8 hours.  You have very dark urine.  You have trouble breathing.  Your heart rate while sitting still is over 100 beats a minute.  You cannot drink fluids without vomiting.  You have vomiting or diarrhea that: ? Gets worse. ? Does not go away.  You have a fever. This information is not intended to replace advice given to you by your health care provider. Make sure you discuss any questions you have with your health care provider. Document Revised: 03/23/2017 Document Reviewed: 06/04/2015 Elsevier Patient Education  2020 Elsevier Inc.   

## 2019-11-12 NOTE — Progress Notes (Signed)
No treatment, IVF and iron only per PA.

## 2019-11-12 NOTE — Progress Notes (Signed)
Hematology and Oncology Follow Up Visit  Tonya [name] [id number] [birth year] 79 y.o. 11/12/2019   Principle Diagnosis:  Stage IV adenocarcinoma of the ascending colon -- NRAS+, BRAF-, HER2-, MSI/MMR -,  Iron deficiency anemia DVT in LEFT leg--  Peroneal and popliteal vein  Past Therapy: FOLFOX - s/p cycle5  Current Therapy: FOLFIRI/Bevacizumab -started 01/20/2019,s/p cycle 13 -- Avastin d/c'ed on 10/21/2019 due to DVT IV Iron as indicated Eliquis 5 mg po BID -- started on 09/30/2019   Interim History:  Tonya [name] is here today with her daughter for follow-up and treatment. She is feeling very fatigued and weak. She states that she is spending most her time in bed.  CEA in June was up slightly at 5.14.  She has chronic dizziness.  No fever, chills, n/v, cough, rash, chest pain, palpitations, abdominal pain or changes in bowel or bladder habits.  The swelling in her left leg looks slightly improved. Pedal pulses are 2+. No numbness or tingling in her extremities at this time.  She is doing well on Eliquis BID and has not had any issues with bleeding or abnormal bruising.  No falls or syncopal episodes to report.  She states that her appetite is ok and she is doing her best to stay well hydrated but will try stopping fluid intake at 6 pm. Hopefully this will help with her frequent urination at night. Her weight is stable at 180 lbs.   ECOG Performance Status: 3 - Symptomatic, >50% confined to bed  Medications:  Allergies as of 11/12/2019      Reactions   Hydrocodone Swelling, Other (See Comments)   "I started swelling, became red, and passed out"   Iohexol Hives, Shortness Of Breath, Other (See Comments)   Patient developed hives and fullness in throat post injection of 125cc's Omni 300, Onset Date: 11/15/2006   Naproxen Other (See Comments)   "It made me feel out of my head"   Ibuprofen Nausea Only   Propoxyphene N-acetaminophen Other (See Comments)   "I couldn't  find the door to make my way out of the room- I was in misery"      Medication List       Accurate as of November 12, 2019 10:54 AM. If you have any questions, ask your nurse or doctor.        amLODipine 10 MG tablet Commonly known as: NORVASC Take 1 tablet (10 mg total) by mouth daily.   apixaban 5 MG Tabs tablet Commonly known as: Eliquis Take 1 tablet (5 mg total) by mouth 2 (two) times daily.   aspirin EC 81 MG tablet Take 81 mg by mouth daily.   atorvastatin 10 MG tablet Commonly known as: LIPITOR Take 1 tablet (10 mg total) by mouth daily.   B-D SINGLE USE SWABS REGULAR Pads 1 each by Does not apply route as needed (to cleanse site prior to obtaining droplet of blood for CBG's). E11.9   B-D ULTRAFINE III SHORT PEN 31G X 8 MM Misc Generic drug: Insulin Pen Needle   Cholecalciferol 50 MCG (2000 UT) Tabs Take 1 tablet (2,000 Units total) by mouth daily.   dexamethasone 4 MG tablet Commonly known as: DECADRON Take 2 tablets (8 mg total) by mouth 2 (two) times daily. For four days following chemotherapy.   famotidine 40 MG tablet Commonly known as: Pepcid Take 1 tablet (40 mg total) by mouth 2 (two) times daily.   lidocaine-prilocaine cream Commonly known as: EMLA   loperamide 2 MG tablet Commonly  known as: Imodium A-D Take 2 tablets (4 mg total) by mouth 4 (four) times daily as needed. Take 2 at diarrhea onset , then 1 every 2hr until 12hrs with no BM. May take 2 every 4hrs at night. If diarrhea recurs repeat.   losartan 50 MG tablet Commonly known as: COZAAR Take 50 mg by mouth daily.   magic mouthwash Soln Swish and swallow equal parts Benadryl, Lidocaine and Nystatin.  5- 10 ml.'s four times a day as needed.   meclizine 12.5 MG tablet Commonly known as: ANTIVERT TAKE 1 TABLET(12.5 MG) BY MOUTH THREE TIMES DAILY AS NEEDED FOR DIZZINESS   metoprolol tartrate 25 MG tablet Commonly known as: LOPRESSOR Take 1 tablet (25 mg total) by mouth 2 (two) times  daily.   olmesartan 20 MG tablet Commonly known as: BENICAR Take 1 tablet (20 mg total) by mouth daily.   temazepam 7.5 MG capsule Commonly known as: Restoril Take 1 capsule (7.5 mg total) by mouth at bedtime as needed for sleep.   traMADol 50 MG tablet Commonly known as: ULTRAM Take 1 tablet (50 mg total) by mouth every 6 (six) hours as needed.   Tyler Aas FlexTouch 100 UNIT/ML FlexTouch Pen Generic drug: insulin degludec Inject 0.21 mLs (21 Units total) into the skin daily. Take 34 units on chemo days   True Metrix Blood Glucose Test test strip Generic drug: glucose blood 1 each by Other route 2 (two) times daily. E11.9   TRUEplus Lancets 30G Misc 1 each by Does not apply route 2 (two) times daily. E11.9       Allergies:  Allergies  Allergen Reactions  . Hydrocodone Swelling and Other (See Comments)    "I started swelling, became red, and passed out"  . Iohexol Hives, Shortness Of Breath and Other (See Comments)    Patient developed hives and fullness in throat post injection of 125cc's Omni 300, Onset Date: 11/15/2006   . Naproxen Other (See Comments)    "It made me feel out of my head"  . Ibuprofen Nausea Only  . Propoxyphene N-Acetaminophen Other (See Comments)    "I couldn't find the door to make my way out of the room- I was in misery"    Past Medical History, Surgical history, Social history, and Family History were reviewed and updated.  Review of Systems: All other 10 point review of systems is negative.   Physical Exam:  weight is 180 lb (81.6 kg). Her oral temperature is 97.9 F (36.6 C). Her blood pressure is 133/77 and her pulse is 85. Her respiration is 18 and oxygen saturation is 100%.   Wt Readings from Last 3 Encounters:  11/12/19 180 lb (81.6 kg)  10/21/19 181 lb 1.9 oz (82.2 kg)  10/21/19 181 lb 1.9 oz (82.2 kg)    Ocular: Sclerae unicteric, pupils equal, round and reactive to light Ear-nose-throat: Oropharynx clear, dentition  fair Lymphatic: No cervical or supraclavicular adenopathy Lungs no rales or rhonchi, good excursion bilaterally Heart regular rate and rhythm, no murmur appreciated Abd soft, nontender, positive bowel sounds, no liver or spleen tip palpated on exam, no fluid wave  MSK no focal spinal tenderness, no joint edema Neuro: non-focal, well-oriented, appropriate affect Breasts: Deferred   Lab Results  Component Value Date   WBC 6.9 11/12/2019   HGB 10.8 (L) 11/12/2019   HCT 35.5 (L) 11/12/2019   MCV 90.6 11/12/2019   PLT 227 11/12/2019   Lab Results  Component Value Date   FERRITIN 302 10/21/2019  IRON 46 10/21/2019   TIBC 248 10/21/2019   UIBC 202 10/21/2019   IRONPCTSAT 19 (L) 10/21/2019   Lab Results  Component Value Date   RETICCTPCT 1.5 05/17/2016   RBC 3.92 11/12/2019   RETICCTABS 62,400 05/17/2016   No results found for: KPAFRELGTCHN, LAMBDASER, KAPLAMBRATIO No results found for: IGGSERUM, IGA, IGMSERUM No results found for: Odetta Pink, SPEI   Chemistry      Component Value Date/Time   NA 144 11/12/2019 0815   NA 139 09/30/2018 1311   K 3.6 11/12/2019 0815   CL 108 11/12/2019 0815   CO2 29 11/12/2019 0815   BUN 9 11/12/2019 0815   BUN 10 09/30/2018 1311   CREATININE 0.63 11/12/2019 0815      Component Value Date/Time   CALCIUM 9.7 11/12/2019 0815   ALKPHOS 71 11/12/2019 0815   AST 16 11/12/2019 0815   ALT 13 11/12/2019 0815   BILITOT 0.6 11/12/2019 0815       Impression and Plan: Ms. Manka is a very pleasant 79 yo African American female with metastatic colon cancer. I spoke with Dr. Marin Olp about her extreme fatigue and weakness and we will hold treatment today. She will get IV iron and fluids only.  She will get another Iv iron this week and again when she comes back in a week to resume treatment. Hopefully this will help give her a boost.  Both she and her daughter agree with the plan.  I also went  over her lab work with them in detail.  The will contact our office with any questions or concerns.   Laverna Peace, NP 7/21/202110:54 AM

## 2019-11-14 ENCOUNTER — Other Ambulatory Visit: Payer: Self-pay

## 2019-11-14 ENCOUNTER — Inpatient Hospital Stay: Payer: Medicare HMO

## 2019-11-14 VITALS — BP 139/63 | HR 64 | Temp 98.0°F | Resp 17

## 2019-11-14 DIAGNOSIS — Z7901 Long term (current) use of anticoagulants: Secondary | ICD-10-CM | POA: Diagnosis not present

## 2019-11-14 DIAGNOSIS — Z86718 Personal history of other venous thrombosis and embolism: Secondary | ICD-10-CM | POA: Diagnosis not present

## 2019-11-14 DIAGNOSIS — C182 Malignant neoplasm of ascending colon: Secondary | ICD-10-CM | POA: Diagnosis not present

## 2019-11-14 DIAGNOSIS — Z5111 Encounter for antineoplastic chemotherapy: Secondary | ICD-10-CM | POA: Diagnosis not present

## 2019-11-14 DIAGNOSIS — Z7982 Long term (current) use of aspirin: Secondary | ICD-10-CM | POA: Diagnosis not present

## 2019-11-14 DIAGNOSIS — Z7952 Long term (current) use of systemic steroids: Secondary | ICD-10-CM | POA: Diagnosis not present

## 2019-11-14 DIAGNOSIS — D509 Iron deficiency anemia, unspecified: Secondary | ICD-10-CM | POA: Diagnosis not present

## 2019-11-14 DIAGNOSIS — D500 Iron deficiency anemia secondary to blood loss (chronic): Secondary | ICD-10-CM

## 2019-11-14 DIAGNOSIS — Z79899 Other long term (current) drug therapy: Secondary | ICD-10-CM | POA: Diagnosis not present

## 2019-11-14 MED ORDER — SODIUM CHLORIDE 0.9% FLUSH
10.0000 mL | Freq: Once | INTRAVENOUS | Status: AC | PRN
  Administered 2019-11-14: 10 mL
  Filled 2019-11-14: qty 10

## 2019-11-14 MED ORDER — HEPARIN SOD (PORK) LOCK FLUSH 100 UNIT/ML IV SOLN
500.0000 [IU] | Freq: Once | INTRAVENOUS | Status: AC | PRN
  Administered 2019-11-14: 500 [IU]
  Filled 2019-11-14: qty 5

## 2019-11-14 MED ORDER — SODIUM CHLORIDE 0.9 % IV SOLN
200.0000 mg | Freq: Once | INTRAVENOUS | Status: AC
  Administered 2019-11-14: 200 mg via INTRAVENOUS
  Filled 2019-11-14: qty 200

## 2019-11-14 NOTE — Patient Instructions (Signed)
Iron Sucrose injection What is this medicine? IRON SUCROSE (AHY ern SOO krohs) is an iron complex. Iron is used to make healthy red blood cells, which carry oxygen and nutrients throughout the body. This medicine is used to treat iron deficiency anemia in people with chronic kidney disease. This medicine may be used for other purposes; ask your health care provider or pharmacist if you have questions. COMMON BRAND NAME(S): Venofer What should I tell my health care provider before I take this medicine? They need to know if you have any of these conditions:  anemia not caused by low iron levels  heart disease  high levels of iron in the blood  kidney disease  liver disease  an unusual or allergic reaction to iron, other medicines, foods, dyes, or preservatives  pregnant or trying to get pregnant  breast-feeding How should I use this medicine? This medicine is for infusion into a vein. It is given by a health care professional in a hospital or clinic setting. Talk to your pediatrician regarding the use of this medicine in children. While this drug may be prescribed for children as young as 2 years for selected conditions, precautions do apply. Overdosage: If you think you have taken too much of this medicine contact a poison control center or emergency room at once. NOTE: This medicine is only for you. Do not share this medicine with others. What if I miss a dose? It is important not to miss your dose. Call your doctor or health care professional if you are unable to keep an appointment. What may interact with this medicine? Do not take this medicine with any of the following medications:  deferoxamine  dimercaprol  other iron products This medicine may also interact with the following medications:  chloramphenicol  deferasirox This list may not describe all possible interactions. Give your health care provider a list of all the medicines, herbs, non-prescription drugs, or  dietary supplements you use. Also tell them if you smoke, drink alcohol, or use illegal drugs. Some items may interact with your medicine. What should I watch for while using this medicine? Visit your doctor or healthcare professional regularly. Tell your doctor or healthcare professional if your symptoms do not start to get better or if they get worse. You may need blood work done while you are taking this medicine. You may need to follow a special diet. Talk to your doctor. Foods that contain iron include: whole grains/cereals, dried fruits, beans, or peas, leafy green vegetables, and organ meats (liver, kidney). What side effects may I notice from receiving this medicine? Side effects that you should report to your doctor or health care professional as soon as possible:  allergic reactions like skin rash, itching or hives, swelling of the face, lips, or tongue  breathing problems  changes in blood pressure  cough  fast, irregular heartbeat  feeling faint or lightheaded, falls  fever or chills  flushing, sweating, or hot feelings  joint or muscle aches/pains  seizures  swelling of the ankles or feet  unusually weak or tired Side effects that usually do not require medical attention (report to your doctor or health care professional if they continue or are bothersome):  diarrhea  feeling achy  headache  irritation at site where injected  nausea, vomiting  stomach upset  tiredness This list may not describe all possible side effects. Call your doctor for medical advice about side effects. You may report side effects to FDA at 1-800-FDA-1088. Where should I keep   my medicine? This drug is given in a hospital or clinic and will not be stored at home. NOTE: This sheet is a summary. It may not cover all possible information. If you have questions about this medicine, talk to your doctor, pharmacist, or health care provider.  2020 Elsevier/Gold Standard (2011-01-19  17:14:35) Rehydration, Adult Rehydration is the replacement of body fluids and salts and minerals (electrolytes) that are lost during dehydration. Dehydration is when there is not enough fluid or water in the body. This happens when you lose more fluids than you take in. Common causes of dehydration include:  Vomiting.  Diarrhea.  Excessive sweating, such as from heat exposure or exercise.  Taking medicines that cause the body to lose excess fluid (diuretics).  Impaired kidney function.  Not drinking enough fluid.  Certain illnesses or infections.  Certain poorly controlled long-term (chronic) illnesses, such as diabetes, heart disease, and kidney disease.  Symptoms of mild dehydration may include thirst, dry lips and mouth, dry skin, and dizziness. Symptoms of severe dehydration may include increased heart rate, confusion, fainting, and not urinating. You can rehydrate by drinking certain fluids or getting fluids through an IV tube, as told by your health care provider. What are the risks? Generally, rehydration is safe. However, one problem that can happen is taking in too much fluid (overhydration). This is rare. If overhydration happens, it can cause an electrolyte imbalance, kidney failure, or a decrease in salt (sodium) levels in the body. How to rehydrate Follow instructions from your health care provider for rehydration. The kind of fluid you should drink and the amount you should drink depend on your condition.  If directed by your health care provider, drink an oral rehydration solution (ORS). This is a drink designed to treat dehydration that is found in pharmacies and retail stores. ? Make an ORS by following instructions on the package. ? Start by drinking small amounts, about  cup (120 mL) every 5-10 minutes. ? Slowly increase how much you drink until you have taken the amount recommended by your health care provider.  Drink enough clear fluids to keep your urine clear  or pale yellow. If you were instructed to drink an ORS, finish the ORS first, then start slowly drinking other clear fluids. Drink fluids such as: ? Water. Do not drink only water. Doing that can lead to having too little sodium in your body (hyponatremia). ? Ice chips. ? Fruit juice that you have added water to (diluted juice). ? Low-calorie sports drinks.  If you are severely dehydrated, your health care provider may recommend that you receive fluids through an IV tube in the hospital.  Do not take sodium tablets. Doing that can lead to the condition of having too much sodium in your body (hypernatremia). Eating while you rehydrate Follow instructions from your health care provider about what to eat while you rehydrate. Your health care provider may recommend that you slowly begin eating regular foods in small amounts.  Eat foods that contain a healthy balance of electrolytes, such as bananas, oranges, potatoes, tomatoes, and spinach.  Avoid foods that are greasy or contain a lot of fat or sugar.  In some cases, you may get nutrition through a feeding tube that is passed through your nose and into your stomach (nasogastric tube, or NG tube). This may be done if you have uncontrolled vomiting or diarrhea. Beverages to avoid Certain beverages may make dehydration worse. While you rehydrate, avoid:  Alcohol.  Caffeine.  Drinks that contain  a lot of sugar. These include: ? High-calorie sports drinks. ? Fruit juice that is not diluted. ? Soda.  Check nutrition labels to see how much sugar or caffeine a beverage contains. Signs of dehydration recovery You may be recovering from dehydration if:  You are urinating more often than before you started rehydrating.  Your urine is clear or pale yellow.  Your energy level improves.  You vomit less frequently.  You have diarrhea less frequently.  Your appetite improves or returns to normal.  You feel less dizzy or less  light-headed.  Your skin tone and color start to look more normal. Contact a health care provider if:  You continue to have symptoms of mild dehydration, such as: ? Thirst. ? Dry lips. ? Slightly dry mouth. ? Dry, warm skin. ? Dizziness.  You continue to vomit or have diarrhea. Get help right away if:  You have symptoms of dehydration that get worse.  You feel: ? Confused. ? Weak. ? Like you are going to faint.  You have not urinated in 6-8 hours.  You have very dark urine.  You have trouble breathing.  Your heart rate while sitting still is over 100 beats a minute.  You cannot drink fluids without vomiting.  You have vomiting or diarrhea that: ? Gets worse. ? Does not go away.  You have a fever. This information is not intended to replace advice given to you by your health care provider. Make sure you discuss any questions you have with your health care provider. Document Revised: 03/23/2017 Document Reviewed: 06/04/2015 Elsevier Patient Education  [place].  3

## 2019-11-19 ENCOUNTER — Inpatient Hospital Stay: Payer: Medicare HMO

## 2019-11-19 ENCOUNTER — Encounter: Payer: Self-pay | Admitting: Family

## 2019-11-19 ENCOUNTER — Other Ambulatory Visit: Payer: Self-pay

## 2019-11-19 ENCOUNTER — Inpatient Hospital Stay (HOSPITAL_BASED_OUTPATIENT_CLINIC_OR_DEPARTMENT_OTHER): Payer: Medicare HMO | Admitting: Family

## 2019-11-19 VITALS — BP 141/78 | Temp 97.6°F | Resp 18 | Ht 71.0 in | Wt 181.0 lb

## 2019-11-19 DIAGNOSIS — D500 Iron deficiency anemia secondary to blood loss (chronic): Secondary | ICD-10-CM | POA: Diagnosis not present

## 2019-11-19 DIAGNOSIS — C187 Malignant neoplasm of sigmoid colon: Secondary | ICD-10-CM | POA: Diagnosis not present

## 2019-11-19 DIAGNOSIS — C182 Malignant neoplasm of ascending colon: Secondary | ICD-10-CM | POA: Diagnosis not present

## 2019-11-19 DIAGNOSIS — D509 Iron deficiency anemia, unspecified: Secondary | ICD-10-CM | POA: Diagnosis not present

## 2019-11-19 DIAGNOSIS — C189 Malignant neoplasm of colon, unspecified: Secondary | ICD-10-CM

## 2019-11-19 DIAGNOSIS — I10 Essential (primary) hypertension: Secondary | ICD-10-CM | POA: Diagnosis not present

## 2019-11-19 DIAGNOSIS — C787 Secondary malignant neoplasm of liver and intrahepatic bile duct: Secondary | ICD-10-CM

## 2019-11-19 DIAGNOSIS — E785 Hyperlipidemia, unspecified: Secondary | ICD-10-CM | POA: Diagnosis not present

## 2019-11-19 DIAGNOSIS — Z5111 Encounter for antineoplastic chemotherapy: Secondary | ICD-10-CM | POA: Diagnosis not present

## 2019-11-19 DIAGNOSIS — Z7901 Long term (current) use of anticoagulants: Secondary | ICD-10-CM | POA: Diagnosis not present

## 2019-11-19 DIAGNOSIS — Z79899 Other long term (current) drug therapy: Secondary | ICD-10-CM | POA: Diagnosis not present

## 2019-11-19 DIAGNOSIS — Z86718 Personal history of other venous thrombosis and embolism: Secondary | ICD-10-CM | POA: Diagnosis not present

## 2019-11-19 DIAGNOSIS — Z7952 Long term (current) use of systemic steroids: Secondary | ICD-10-CM | POA: Diagnosis not present

## 2019-11-19 DIAGNOSIS — Z7982 Long term (current) use of aspirin: Secondary | ICD-10-CM | POA: Diagnosis not present

## 2019-11-19 LAB — CBC WITH DIFFERENTIAL (CANCER CENTER ONLY)
Abs Immature Granulocytes: 0.05 10*3/uL (ref 0.00–0.07)
Basophils Absolute: 0 10*3/uL (ref 0.0–0.1)
Basophils Relative: 1 %
Eosinophils Absolute: 0.1 10*3/uL (ref 0.0–0.5)
Eosinophils Relative: 1 %
HCT: 34.5 % — ABNORMAL LOW (ref 36.0–46.0)
Hemoglobin: 10.5 g/dL — ABNORMAL LOW (ref 12.0–15.0)
Immature Granulocytes: 1 %
Lymphocytes Relative: 22 %
Lymphs Abs: 1.3 10*3/uL (ref 0.7–4.0)
MCH: 27.6 pg (ref 26.0–34.0)
MCHC: 30.4 g/dL (ref 30.0–36.0)
MCV: 90.6 fL (ref 80.0–100.0)
Monocytes Absolute: 0.7 10*3/uL (ref 0.1–1.0)
Monocytes Relative: 11 %
Neutro Abs: 4 10*3/uL (ref 1.7–7.7)
Neutrophils Relative %: 64 %
Platelet Count: 208 10*3/uL (ref 150–400)
RBC: 3.81 MIL/uL — ABNORMAL LOW (ref 3.87–5.11)
RDW: 16 % — ABNORMAL HIGH (ref 11.5–15.5)
WBC Count: 6.1 10*3/uL (ref 4.0–10.5)
nRBC: 0 % (ref 0.0–0.2)

## 2019-11-19 LAB — CMP (CANCER CENTER ONLY)
ALT: 13 U/L (ref 0–44)
AST: 17 U/L (ref 15–41)
Albumin: 3.7 g/dL (ref 3.5–5.0)
Alkaline Phosphatase: 64 U/L (ref 38–126)
Anion gap: 6 (ref 5–15)
BUN: 15 mg/dL (ref 8–23)
CO2: 30 mmol/L (ref 22–32)
Calcium: 9.6 mg/dL (ref 8.9–10.3)
Chloride: 108 mmol/L (ref 98–111)
Creatinine: 0.72 mg/dL (ref 0.44–1.00)
GFR, Est AFR Am: >60 mL/min (ref >60)
GFR, Estimated: >60 mL/min (ref >60)
Glucose, Bld: 165 mg/dL — ABNORMAL HIGH (ref 70–99)
Potassium: 3.7 mmol/L (ref 3.5–5.1)
Sodium: 144 mmol/L (ref 135–145)
Total Bilirubin: 0.5 mg/dL (ref 0.3–1.2)
Total Protein: 5.6 g/dL — ABNORMAL LOW (ref 6.5–8.1)

## 2019-11-19 MED ORDER — SODIUM CHLORIDE 0.9 % IV SOLN
Freq: Once | INTRAVENOUS | Status: AC
  Filled 2019-11-19: qty 250

## 2019-11-19 MED ORDER — ATROPINE SULFATE 1 MG/ML IJ SOLN
INTRAMUSCULAR | Status: AC
  Filled 2019-11-19: qty 1

## 2019-11-19 MED ORDER — ATROPINE SULFATE 1 MG/ML IJ SOLN
0.5000 mg | Freq: Once | INTRAMUSCULAR | Status: AC | PRN
  Administered 2019-11-19: 0.5 mg via INTRAVENOUS

## 2019-11-19 MED ORDER — FLUOROURACIL CHEMO INJECTION 2.5 GM/50ML
320.0000 mg/m2 | Freq: Once | INTRAVENOUS | Status: AC
  Administered 2019-11-19: 650 mg via INTRAVENOUS
  Filled 2019-11-19: qty 13

## 2019-11-19 MED ORDER — SODIUM CHLORIDE 0.9 % IV SOLN
1920.0000 mg/m2 | INTRAVENOUS | Status: DC
  Administered 2019-11-19: 3750 mg via INTRAVENOUS
  Filled 2019-11-19: qty 75

## 2019-11-19 MED ORDER — PALONOSETRON HCL INJECTION 0.25 MG/5ML
INTRAVENOUS | Status: AC
  Filled 2019-11-19: qty 5

## 2019-11-19 MED ORDER — SODIUM CHLORIDE 0.9 % IV SOLN
135.0000 mg/m2 | Freq: Once | INTRAVENOUS | Status: AC
  Administered 2019-11-19: 260 mg via INTRAVENOUS
  Filled 2019-11-19: qty 8

## 2019-11-19 MED ORDER — PALONOSETRON HCL INJECTION 0.25 MG/5ML
0.2500 mg | Freq: Once | INTRAVENOUS | Status: AC
  Administered 2019-11-19: 0.25 mg via INTRAVENOUS

## 2019-11-19 MED ORDER — SODIUM CHLORIDE 0.9 % IV SOLN
10.0000 mg | Freq: Once | INTRAVENOUS | Status: AC
  Administered 2019-11-19: 10 mg via INTRAVENOUS
  Filled 2019-11-19: qty 10

## 2019-11-19 MED ORDER — SODIUM CHLORIDE 0.9 % IV SOLN
200.0000 mg | Freq: Once | INTRAVENOUS | Status: AC
  Administered 2019-11-19: 200 mg via INTRAVENOUS
  Filled 2019-11-19: qty 200

## 2019-11-19 MED ORDER — SODIUM CHLORIDE 0.9 % IV SOLN
400.0000 mg/m2 | Freq: Once | INTRAVENOUS | Status: AC
  Administered 2019-11-19: 784 mg via INTRAVENOUS
  Filled 2019-11-19: qty 39.2

## 2019-11-19 NOTE — Addendum Note (Signed)
Addended by: Burney Gauze R on: 11/19/2019 09:26 AM   Modules accepted: Orders

## 2019-11-19 NOTE — Progress Notes (Signed)
Hematology and Oncology Follow Up Visit  Tonya [name] [id number] September 28, 1940 79 y.o. 11/19/2019   Principle Diagnosis:  Stage IV adenocarcinoma of the ascending colon -- NRAS+, BRAF-, HER2-, MSI/MMR -,  Iron deficiency anemia DVT in LEFT leg-- Peroneal and popliteal vein  Past Therapy: FOLFOX - s/p cycle5  Current Therapy: FOLFIRI/Bevacizumab -started 01/20/2019,s/p cycle 13 -- Avastin d/c'ed on 10/21/2019 due to DVT IV Iron as indicated Eliquis 5 mg po BID -- started on 09/30/2019   Interim History:  Tonya [name] is here today with her daughter for follow-up and treatment. She still has some fatigue but is feeling a little better.  CEA last week was 4.61.  She has some mild SOB with exertion.  She has gotten 2 doses of IV iron and will get a third today.  No blood loss noted. No abnormal bruising. No petechiae.  No fever, chills, n/v, cough, rash, chest pain, palpitations, abdominal pain or changes in bowel or bladder habits.  The swelling in her lower extremities is stable. Left leg with the history of DVT is more prominent. Pedal pulses are 2+.  No numbness or tingling.  She plans to increase her iron and protein intake. She is drinking a Glucerna each day and will add turnip greens back into her diet. She is hydrating well and her weight is stable at 181 lbs.   ECOG Performance Status: 1 - Symptomatic but completely ambulatory  Medications:  Allergies as of 11/19/2019      Reactions   Hydrocodone Swelling, Other (See Comments)   "I started swelling, became red, and passed out"   Iohexol Hives, Shortness Of Breath, Other (See Comments)   Patient developed hives and fullness in throat post injection of 125cc's Omni 300, Onset Date: 11/15/2006   Naproxen Other (See Comments)   "It made me feel out of my head"   Ibuprofen Nausea Only   Propoxyphene N-acetaminophen Other (See Comments)   "I couldn't find the door to make my way out of the room- I was in misery"       Medication List       Accurate as of November 19, 2019  8:48 AM. If you have any questions, ask your nurse or doctor.        amLODipine 10 MG tablet Commonly known as: NORVASC Take 1 tablet (10 mg total) by mouth daily.   apixaban 5 MG Tabs tablet Commonly known as: Eliquis Take 1 tablet (5 mg total) by mouth 2 (two) times daily.   aspirin EC 81 MG tablet Take 81 mg by mouth daily.   atorvastatin 10 MG tablet Commonly known as: LIPITOR Take 1 tablet (10 mg total) by mouth daily.   B-D SINGLE USE SWABS REGULAR Pads 1 each by Does not apply route as needed (to cleanse site prior to obtaining droplet of blood for CBG's). E11.9   B-D ULTRAFINE III SHORT PEN 31G X 8 MM Misc Generic drug: Insulin Pen Needle   Cholecalciferol 50 MCG (2000 UT) Tabs Take 1 tablet (2,000 Units total) by mouth daily.   dexamethasone 4 MG tablet Commonly known as: DECADRON Take 2 tablets (8 mg total) by mouth 2 (two) times daily. For four days following chemotherapy.   famotidine 40 MG tablet Commonly known as: Pepcid Take 1 tablet (40 mg total) by mouth 2 (two) times daily.   lidocaine-prilocaine cream Commonly known as: EMLA   loperamide 2 MG tablet Commonly known as: Imodium A-D Take 2 tablets (4 mg total) by mouth  4 (four) times daily as needed. Take 2 at diarrhea onset , then 1 every 2hr until 12hrs with no BM. May take 2 every 4hrs at night. If diarrhea recurs repeat.   losartan 50 MG tablet Commonly known as: COZAAR Take 50 mg by mouth daily.   magic mouthwash Soln Swish and swallow equal parts Benadryl, Lidocaine and Nystatin.  5- 10 ml.'s four times a day as needed.   meclizine 12.5 MG tablet Commonly known as: ANTIVERT TAKE 1 TABLET(12.5 MG) BY MOUTH THREE TIMES DAILY AS NEEDED FOR DIZZINESS   metoprolol tartrate 25 MG tablet Commonly known as: LOPRESSOR Take 1 tablet (25 mg total) by mouth 2 (two) times daily.   olmesartan 20 MG tablet Commonly known as: BENICAR Take 1  tablet (20 mg total) by mouth daily.   temazepam 7.5 MG capsule Commonly known as: Restoril Take 1 capsule (7.5 mg total) by mouth at bedtime as needed for sleep.   traMADol 50 MG tablet Commonly known as: ULTRAM Take 1 tablet (50 mg total) by mouth every 6 (six) hours as needed.   Tyler Aas FlexTouch 100 UNIT/ML FlexTouch Pen Generic drug: insulin degludec Inject 0.21 mLs (21 Units total) into the skin daily. Take 34 units on chemo days   True Metrix Blood Glucose Test test strip Generic drug: glucose blood 1 each by Other route 2 (two) times daily. E11.9   TRUEplus Lancets 30G Misc 1 each by Does not apply route 2 (two) times daily. E11.9       Allergies:  Allergies  Allergen Reactions  . Hydrocodone Swelling and Other (See Comments)    "I started swelling, became red, and passed out"  . Iohexol Hives, Shortness Of Breath and Other (See Comments)    Patient developed hives and fullness in throat post injection of 125cc's Omni 300, Onset Date: 11/15/2006   . Naproxen Other (See Comments)    "It made me feel out of my head"  . Ibuprofen Nausea Only  . Propoxyphene N-Acetaminophen Other (See Comments)    "I couldn't find the door to make my way out of the room- I was in misery"    Past Medical History, Surgical history, Social history, and Family History were reviewed and updated.  Review of Systems: All other 10 point review of systems is negative.   Physical Exam:  vitals were not taken for this visit.   Wt Readings from Last 3 Encounters:  11/12/19 180 lb (81.6 kg)  10/21/19 181 lb 1.9 oz (82.2 kg)  10/21/19 181 lb 1.9 oz (82.2 kg)    Ocular: Sclerae unicteric, pupils equal, round and reactive to light Ear-nose-throat: Oropharynx clear, dentition fair Lymphatic: No cervical or supraclavicular adenopathy Lungs no rales or rhonchi, good excursion bilaterally Heart regular rate and rhythm, no murmur appreciated Abd soft, nontender, positive bowel sounds, no  liver or spleen tip palpated on exam, no fluid wave  MSK no focal spinal tenderness, no joint edema Neuro: non-focal, well-oriented, appropriate affect Breasts: Deferred  Lab Results  Component Value Date   WBC 6.9 11/12/2019   HGB 10.8 (L) 11/12/2019   HCT 35.5 (L) 11/12/2019   MCV 90.6 11/12/2019   PLT 227 11/12/2019   Lab Results  Component Value Date   FERRITIN 236 11/12/2019   IRON 35 (L) 11/12/2019   TIBC 243 11/12/2019   UIBC 207 11/12/2019   IRONPCTSAT 14 (L) 11/12/2019   Lab Results  Component Value Date   RETICCTPCT 1.5 05/17/2016   RBC 3.92 11/12/2019  RETICCTABS 62,400 05/17/2016   No results found for: KPAFRELGTCHN, LAMBDASER, KAPLAMBRATIO No results found for: IGGSERUM, IGA, IGMSERUM No results found for: Odetta Pink, SPEI   Chemistry      Component Value Date/Time   NA 144 11/12/2019 0815   NA 139 09/30/2018 1311   K 3.6 11/12/2019 0815   CL 108 11/12/2019 0815   CO2 29 11/12/2019 0815   BUN 9 11/12/2019 0815   BUN 10 09/30/2018 1311   CREATININE 0.63 11/12/2019 0815      Component Value Date/Time   CALCIUM 9.7 11/12/2019 0815   ALKPHOS 71 11/12/2019 0815   AST 16 11/12/2019 0815   ALT 13 11/12/2019 0815   BILITOT 0.6 11/12/2019 0815       Impression and Plan: Tonya [name] is a very pleasant 79 yo African American female with metastatic colon cancer. She is feeling better this week and is ready to resume treatment.  She gets IV iron again today as well.  We will plan to see her again in 2 weeks.  They can contact our office with any questions or concerns. We can certainly see her sooner if needed.   Laverna Peace, NP 7/28/20218:48 AM

## 2019-11-20 ENCOUNTER — Telehealth: Payer: Self-pay | Admitting: Hematology & Oncology

## 2019-11-20 NOTE — Telephone Encounter (Signed)
Called and spoke with patient's daughter Tonya [name] and advised her that her mother's treatments were supposed to be q3w instead of q2w as 7/28 los had stated.  Appts for 8/11 were moved out until 8/18. She was ok with this change.  However she did request that her mother's appointments not be scheduled until after 9:00am.  I explained to her that I was unable to schedule as she was requesting because we cannot guarantee any specific times for any of our patients.

## 2019-11-21 ENCOUNTER — Inpatient Hospital Stay: Payer: Medicare HMO

## 2019-11-21 ENCOUNTER — Other Ambulatory Visit: Payer: Self-pay

## 2019-11-21 ENCOUNTER — Other Ambulatory Visit: Payer: Self-pay | Admitting: Hematology & Oncology

## 2019-11-21 VITALS — BP 130/77 | HR 79 | Temp 98.2°F | Resp 18

## 2019-11-21 DIAGNOSIS — Z86718 Personal history of other venous thrombosis and embolism: Secondary | ICD-10-CM | POA: Diagnosis not present

## 2019-11-21 DIAGNOSIS — C182 Malignant neoplasm of ascending colon: Secondary | ICD-10-CM | POA: Diagnosis not present

## 2019-11-21 DIAGNOSIS — I82402 Acute embolism and thrombosis of unspecified deep veins of left lower extremity: Secondary | ICD-10-CM

## 2019-11-21 DIAGNOSIS — C189 Malignant neoplasm of colon, unspecified: Secondary | ICD-10-CM

## 2019-11-21 DIAGNOSIS — Z7952 Long term (current) use of systemic steroids: Secondary | ICD-10-CM | POA: Diagnosis not present

## 2019-11-21 DIAGNOSIS — D509 Iron deficiency anemia, unspecified: Secondary | ICD-10-CM | POA: Diagnosis not present

## 2019-11-21 DIAGNOSIS — Z79899 Other long term (current) drug therapy: Secondary | ICD-10-CM | POA: Diagnosis not present

## 2019-11-21 DIAGNOSIS — Z7982 Long term (current) use of aspirin: Secondary | ICD-10-CM | POA: Diagnosis not present

## 2019-11-21 DIAGNOSIS — Z7901 Long term (current) use of anticoagulants: Secondary | ICD-10-CM | POA: Diagnosis not present

## 2019-11-21 DIAGNOSIS — Z5111 Encounter for antineoplastic chemotherapy: Secondary | ICD-10-CM | POA: Diagnosis not present

## 2019-11-21 MED ORDER — SODIUM CHLORIDE 0.9% FLUSH
10.0000 mL | INTRAVENOUS | Status: DC | PRN
  Administered 2019-11-21: 10 mL
  Filled 2019-11-21: qty 10

## 2019-11-21 MED ORDER — HEPARIN SOD (PORK) LOCK FLUSH 100 UNIT/ML IV SOLN
500.0000 [IU] | Freq: Once | INTRAVENOUS | Status: AC | PRN
  Administered 2019-11-21: 500 [IU]
  Filled 2019-11-21: qty 5

## 2019-11-26 ENCOUNTER — Telehealth: Payer: Self-pay | Admitting: *Deleted

## 2019-11-26 DIAGNOSIS — R42 Dizziness and giddiness: Secondary | ICD-10-CM | POA: Diagnosis not present

## 2019-11-26 DIAGNOSIS — E1169 Type 2 diabetes mellitus with other specified complication: Secondary | ICD-10-CM | POA: Diagnosis not present

## 2019-11-26 DIAGNOSIS — D630 Anemia in neoplastic disease: Secondary | ICD-10-CM | POA: Diagnosis not present

## 2019-11-26 DIAGNOSIS — Z8673 Personal history of transient ischemic attack (TIA), and cerebral infarction without residual deficits: Secondary | ICD-10-CM | POA: Diagnosis not present

## 2019-11-26 DIAGNOSIS — D6859 Other primary thrombophilia: Secondary | ICD-10-CM | POA: Diagnosis not present

## 2019-11-26 DIAGNOSIS — Z7984 Long term (current) use of oral hypoglycemic drugs: Secondary | ICD-10-CM | POA: Diagnosis not present

## 2019-11-26 NOTE — Telephone Encounter (Signed)
Message received from patient's daughter [name] stating that pt is still feeling weak and would like to know her iron levels.  Call placed back to [facility] and message left to inform [name] that pt last had IV iron on 11/19/19 and that it could take several weeks for iron levels to increase and for pt to feel improvement.  Instructed [name] to call office back with any questions or concerns.

## 2019-11-27 ENCOUNTER — Other Ambulatory Visit: Payer: Self-pay | Admitting: Family Medicine

## 2019-11-27 DIAGNOSIS — E785 Hyperlipidemia, unspecified: Secondary | ICD-10-CM

## 2019-11-28 ENCOUNTER — Ambulatory Visit: Payer: Medicare HMO | Admitting: Endocrinology

## 2019-11-29 DIAGNOSIS — C187 Malignant neoplasm of sigmoid colon: Secondary | ICD-10-CM | POA: Diagnosis not present

## 2019-12-02 ENCOUNTER — Other Ambulatory Visit: Payer: Self-pay | Admitting: Family Medicine

## 2019-12-02 DIAGNOSIS — I493 Ventricular premature depolarization: Secondary | ICD-10-CM

## 2019-12-02 DIAGNOSIS — I10 Essential (primary) hypertension: Secondary | ICD-10-CM

## 2019-12-03 ENCOUNTER — Other Ambulatory Visit: Payer: Medicare HMO

## 2019-12-03 ENCOUNTER — Ambulatory Visit: Payer: Medicare HMO | Admitting: Hematology & Oncology

## 2019-12-03 ENCOUNTER — Ambulatory Visit: Payer: Medicare HMO

## 2019-12-03 DIAGNOSIS — E1169 Type 2 diabetes mellitus with other specified complication: Secondary | ICD-10-CM | POA: Diagnosis not present

## 2019-12-03 DIAGNOSIS — D6859 Other primary thrombophilia: Secondary | ICD-10-CM | POA: Diagnosis not present

## 2019-12-03 DIAGNOSIS — I10 Essential (primary) hypertension: Secondary | ICD-10-CM | POA: Diagnosis not present

## 2019-12-03 DIAGNOSIS — R600 Localized edema: Secondary | ICD-10-CM | POA: Diagnosis not present

## 2019-12-03 DIAGNOSIS — Z8673 Personal history of transient ischemic attack (TIA), and cerebral infarction without residual deficits: Secondary | ICD-10-CM | POA: Diagnosis not present

## 2019-12-03 DIAGNOSIS — D630 Anemia in neoplastic disease: Secondary | ICD-10-CM | POA: Diagnosis not present

## 2019-12-10 ENCOUNTER — Inpatient Hospital Stay: Payer: Medicare HMO

## 2019-12-10 ENCOUNTER — Inpatient Hospital Stay: Payer: Medicare HMO | Attending: Hematology & Oncology

## 2019-12-10 ENCOUNTER — Inpatient Hospital Stay (HOSPITAL_BASED_OUTPATIENT_CLINIC_OR_DEPARTMENT_OTHER): Payer: Medicare HMO | Admitting: Hematology & Oncology

## 2019-12-10 ENCOUNTER — Other Ambulatory Visit: Payer: Self-pay

## 2019-12-10 ENCOUNTER — Telehealth: Payer: Self-pay | Admitting: Hematology & Oncology

## 2019-12-10 ENCOUNTER — Encounter: Payer: Self-pay | Admitting: Hematology & Oncology

## 2019-12-10 VITALS — BP 147/74 | HR 65 | Temp 98.5°F | Resp 18 | Wt 183.2 lb

## 2019-12-10 DIAGNOSIS — Z79899 Other long term (current) drug therapy: Secondary | ICD-10-CM | POA: Diagnosis not present

## 2019-12-10 DIAGNOSIS — C182 Malignant neoplasm of ascending colon: Secondary | ICD-10-CM | POA: Diagnosis not present

## 2019-12-10 DIAGNOSIS — Z86718 Personal history of other venous thrombosis and embolism: Secondary | ICD-10-CM | POA: Insufficient documentation

## 2019-12-10 DIAGNOSIS — D500 Iron deficiency anemia secondary to blood loss (chronic): Secondary | ICD-10-CM

## 2019-12-10 DIAGNOSIS — C189 Malignant neoplasm of colon, unspecified: Secondary | ICD-10-CM

## 2019-12-10 DIAGNOSIS — C787 Secondary malignant neoplasm of liver and intrahepatic bile duct: Secondary | ICD-10-CM

## 2019-12-10 DIAGNOSIS — Z5111 Encounter for antineoplastic chemotherapy: Secondary | ICD-10-CM | POA: Insufficient documentation

## 2019-12-10 DIAGNOSIS — D509 Iron deficiency anemia, unspecified: Secondary | ICD-10-CM | POA: Diagnosis not present

## 2019-12-10 DIAGNOSIS — Z7901 Long term (current) use of anticoagulants: Secondary | ICD-10-CM | POA: Diagnosis not present

## 2019-12-10 DIAGNOSIS — C187 Malignant neoplasm of sigmoid colon: Secondary | ICD-10-CM | POA: Diagnosis not present

## 2019-12-10 LAB — CBC WITH DIFFERENTIAL (CANCER CENTER ONLY)
Abs Immature Granulocytes: 0.04 10*3/uL (ref 0.00–0.07)
Basophils Absolute: 0 10*3/uL (ref 0.0–0.1)
Basophils Relative: 0 %
Eosinophils Absolute: 0.1 10*3/uL (ref 0.0–0.5)
Eosinophils Relative: 1 %
HCT: 32.9 % — ABNORMAL LOW (ref 36.0–46.0)
Hemoglobin: 10.1 g/dL — ABNORMAL LOW (ref 12.0–15.0)
Immature Granulocytes: 1 %
Lymphocytes Relative: 22 %
Lymphs Abs: 1.3 10*3/uL (ref 0.7–4.0)
MCH: 28.1 pg (ref 26.0–34.0)
MCHC: 30.7 g/dL (ref 30.0–36.0)
MCV: 91.4 fL (ref 80.0–100.0)
Monocytes Absolute: 0.6 10*3/uL (ref 0.1–1.0)
Monocytes Relative: 10 %
Neutro Abs: 3.8 10*3/uL (ref 1.7–7.7)
Neutrophils Relative %: 66 %
Platelet Count: 192 10*3/uL (ref 150–400)
RBC: 3.6 MIL/uL — ABNORMAL LOW (ref 3.87–5.11)
RDW: 17.1 % — ABNORMAL HIGH (ref 11.5–15.5)
WBC Count: 5.7 10*3/uL (ref 4.0–10.5)
nRBC: 0 % (ref 0.0–0.2)

## 2019-12-10 LAB — CMP (CANCER CENTER ONLY)
ALT: 14 U/L (ref 0–44)
AST: 15 U/L (ref 15–41)
Albumin: 3.7 g/dL (ref 3.5–5.0)
Alkaline Phosphatase: 68 U/L (ref 38–126)
Anion gap: 5 (ref 5–15)
BUN: 11 mg/dL (ref 8–23)
CO2: 29 mmol/L (ref 22–32)
Calcium: 9.7 mg/dL (ref 8.9–10.3)
Chloride: 108 mmol/L (ref 98–111)
Creatinine: 0.76 mg/dL (ref 0.44–1.00)
GFR, Est AFR Am: >60 mL/min (ref >60)
GFR, Estimated: >60 mL/min (ref >60)
Glucose, Bld: 165 mg/dL — ABNORMAL HIGH (ref 70–99)
Potassium: 3.8 mmol/L (ref 3.5–5.1)
Sodium: 142 mmol/L (ref 135–145)
Total Bilirubin: 0.6 mg/dL (ref 0.3–1.2)
Total Protein: 5.6 g/dL — ABNORMAL LOW (ref 6.5–8.1)

## 2019-12-10 LAB — IRON AND TIBC
Iron: 37 ug/dL — ABNORMAL LOW (ref 41–142)
Saturation Ratios: 14 % — ABNORMAL LOW (ref 21–57)
TIBC: 261 ug/dL (ref 236–444)
UIBC: 224 ug/dL (ref 120–384)

## 2019-12-10 LAB — FERRITIN: Ferritin: 360 ng/mL — ABNORMAL HIGH (ref 11–307)

## 2019-12-10 LAB — CEA (IN HOUSE-CHCC): CEA (CHCC-In House): 5.64 ng/mL — ABNORMAL HIGH (ref 0.00–5.00)

## 2019-12-10 MED ORDER — SODIUM CHLORIDE 0.9 % IV SOLN
Freq: Once | INTRAVENOUS | Status: AC
  Filled 2019-12-10: qty 250

## 2019-12-10 MED ORDER — PALONOSETRON HCL INJECTION 0.25 MG/5ML
0.2500 mg | Freq: Once | INTRAVENOUS | Status: AC
  Administered 2019-12-10: 0.25 mg via INTRAVENOUS

## 2019-12-10 MED ORDER — DEXAMETHASONE 4 MG PO TABS: 8.0000 mg | ORAL_TABLET | Freq: Two times a day (BID) | ORAL | 2 refills | Status: DC

## 2019-12-10 MED ORDER — PALONOSETRON HCL INJECTION 0.25 MG/5ML
INTRAVENOUS | Status: AC
  Filled 2019-12-10: qty 5

## 2019-12-10 MED ORDER — ATROPINE SULFATE 1 MG/ML IJ SOLN
0.5000 mg | Freq: Once | INTRAMUSCULAR | Status: AC | PRN
  Administered 2019-12-10: 0.5 mg via INTRAVENOUS

## 2019-12-10 MED ORDER — ATROPINE SULFATE 1 MG/ML IJ SOLN
INTRAMUSCULAR | Status: AC
  Filled 2019-12-10: qty 1

## 2019-12-10 MED ORDER — SODIUM CHLORIDE 0.9 % IV SOLN
400.0000 mg/m2 | Freq: Once | INTRAVENOUS | Status: AC
  Administered 2019-12-10: 784 mg via INTRAVENOUS
  Filled 2019-12-10: qty 39.2

## 2019-12-10 MED ORDER — FLUOROURACIL CHEMO INJECTION 2.5 GM/50ML
320.0000 mg/m2 | Freq: Once | INTRAVENOUS | Status: AC
  Administered 2019-12-10: 650 mg via INTRAVENOUS
  Filled 2019-12-10: qty 13

## 2019-12-10 MED ORDER — SODIUM CHLORIDE 0.9 % IV SOLN
1920.0000 mg/m2 | INTRAVENOUS | Status: DC
  Administered 2019-12-10: 3750 mg via INTRAVENOUS
  Filled 2019-12-10: qty 75

## 2019-12-10 MED ORDER — SODIUM CHLORIDE 0.9 % IV SOLN
10.0000 mg | Freq: Once | INTRAVENOUS | Status: AC
  Administered 2019-12-10: 10 mg via INTRAVENOUS
  Filled 2019-12-10: qty 10

## 2019-12-10 MED ORDER — LIDOCAINE-PRILOCAINE 2.5-2.5 % EX CREA: TOPICAL_CREAM | Freq: Once | CUTANEOUS | 5 refills | Status: AC

## 2019-12-10 MED ORDER — SODIUM CHLORIDE 0.9 % IV SOLN
135.0000 mg/m2 | Freq: Once | INTRAVENOUS | Status: AC
  Administered 2019-12-10: 260 mg via INTRAVENOUS
  Filled 2019-12-10: qty 5

## 2019-12-10 NOTE — Telephone Encounter (Signed)
Appointments scheduled calendar printed per 8/18 los

## 2019-12-10 NOTE — Progress Notes (Signed)
Hematology and Oncology Follow Up Visit  Tonya [name] [id number] Aug 26, 1940 79 y.o. 12/10/2019   Principle Diagnosis:  Stage IV adenocarcinoma of the ascending colon -- NRAS+, BRAF-, HER2-, MSI/MMR -,  Iron deficiency anemia DVT in LEFT leg-- Peroneal and popliteal vein  Past Therapy: FOLFOX - s/p cycle5  Current Therapy: FOLFIRI/Bevacizumab -started 01/20/2019,s/p cycle #15 -- Avastin d/c'ed on 10/21/2019 due to DVT IV Iron as indicated Eliquis 5 mg po BID -- started on 09/30/2019   Interim History:  Tonya [name] is here today with Tonya [name] daughter for follow-up and treatment.  As always, Tonya [name] seemed to be doing pretty well.  Tonya [name] did have that blood clot in the left leg back in early July.  We subsequently have Tonya [name] on Eliquis.  We had to stop the Avastin.  Tonya [name] has had no problems with nausea or vomiting.  Tonya [name] has had no fever.  Tonya [name] has had no bleeding.  Tonya [name] still has weakness on the left side because of past stroke.  Tonya [name] last CEA level was normal at 4.6.  Tonya [name] has had no rashes.  There is been no bleeding.  Is been no change in bowel or bladder habits.  Overall, Tonya [name] performance status is ECOG 1.    Medications:  Allergies as of 12/10/2019      Reactions   Hydrocodone Swelling, Other (See Comments)   "I started swelling, became red, and passed out"   Iohexol Hives, Shortness Of Breath, Other (See Comments)   Patient developed hives and fullness in throat post injection of 125cc's Omni 300, Onset Date: 11/15/2006   Naproxen Other (See Comments)   "It made me feel out of my head"   Ibuprofen Nausea Only   Propoxyphene N-acetaminophen Other (See Comments)   "I couldn't find the door to make my way out of the room- I was in misery"      Medication List       Accurate as of December 10, 2019  8:54 AM. If you have any questions, ask your nurse or doctor.        amLODipine 10 MG tablet Commonly known as: NORVASC Take 1 tablet (10 mg total) by mouth daily.   aspirin  EC 81 MG tablet Take 81 mg by mouth daily.   atorvastatin 10 MG tablet Commonly known as: LIPITOR Take 1 tablet (10 mg total) by mouth daily.   B-D SINGLE USE SWABS REGULAR Pads 1 each by Does not apply route as needed (to cleanse site prior to obtaining droplet of blood for CBG's). E11.9   B-D ULTRAFINE III SHORT PEN 31G X 8 MM Misc Generic drug: Insulin Pen Needle   Cholecalciferol 50 MCG (2000 UT) Tabs Take 1 tablet (2,000 Units total) by mouth daily.   dexamethasone 4 MG tablet Commonly known as: DECADRON Take 2 tablets (8 mg total) by mouth 2 (two) times daily. For four days following chemotherapy.   Eliquis 5 MG Tabs tablet Generic drug: apixaban TAKE 1 TABLET(5 MG) BY MOUTH TWICE DAILY   famotidine 40 MG tablet Commonly known as: Pepcid Take 1 tablet (40 mg total) by mouth 2 (two) times daily.   metoprolol succinate 25 MG 24 hr tablet Commonly known as: TOPROL-XL   Kapspargo Sprinkle 25 MG Cs24 Generic drug: Metoprolol Succinate   lidocaine-prilocaine cream Commonly known as: EMLA Apply topically once for 1 dose. What changed:   how to take this  when to take this Changed by: [name], MD   loperamide 2 MG tablet  Commonly known as: Imodium A-D Take 2 tablets (4 mg total) by mouth 4 (four) times daily as needed. Take 2 at diarrhea onset , then 1 every 2hr until 12hrs with no BM. May take 2 every 4hrs at night. If diarrhea recurs repeat.   losartan 50 MG tablet Commonly known as: COZAAR Take 50 mg by mouth daily.   magic mouthwash Soln Swish and swallow equal parts Benadryl, Lidocaine and Nystatin.  5- 10 ml.'s four times a day as needed.   meclizine 12.5 MG tablet Commonly known as: ANTIVERT TAKE 1 TABLET(12.5 MG) BY MOUTH THREE TIMES DAILY AS NEEDED FOR DIZZINESS   metoprolol tartrate 25 MG tablet Commonly known as: LOPRESSOR Take 1 tablet (25 mg total) by mouth 2 (two) times daily.   olmesartan 20 MG tablet Commonly known as:  BENICAR Take 1 tablet (20 mg total) by mouth daily.   temazepam 7.5 MG capsule Commonly known as: Restoril Take 1 capsule (7.5 mg total) by mouth at bedtime as needed for sleep.   traMADol 50 MG tablet Commonly known as: ULTRAM Take 1 tablet (50 mg total) by mouth every 6 (six) hours as needed.   Tyler Aas FlexTouch 100 UNIT/ML FlexTouch Pen Generic drug: insulin degludec Inject 0.21 mLs (21 Units total) into the skin daily. Take 34 units on chemo days   True Metrix Blood Glucose Test test strip Generic drug: glucose blood 1 each by Other route 2 (two) times daily. E11.9   TRUEplus Lancets 30G Misc 1 each by Does not apply route 2 (two) times daily. E11.9       Allergies:  Allergies  Allergen Reactions  . Hydrocodone Swelling and Other (See Comments)    "I started swelling, became red, and passed out"  . Iohexol Hives, Shortness Of Breath and Other (See Comments)    Patient developed hives and fullness in throat post injection of 125cc's Omni 300, Onset Date: 11/15/2006   . Naproxen Other (See Comments)    "It made me feel out of my head"  . Ibuprofen Nausea Only  . Propoxyphene N-Acetaminophen Other (See Comments)    "I couldn't find the door to make my way out of the room- I was in misery"    Past Medical History, Surgical history, Social history, and Family History were reviewed and updated.  Review of Systems: Review of Systems  Constitutional: Negative.   HENT: Negative.   Eyes: Negative.   Respiratory: Negative.   Cardiovascular: Negative.   Gastrointestinal: Negative.   Genitourinary: Negative.   Musculoskeletal: Negative.   Skin: Negative.   Neurological: Negative.   Endo/Heme/Allergies: Negative.   Psychiatric/Behavioral: Negative.      Physical Exam:  weight is 183 lb 4 oz (83.1 kg). Tonya [name] oral temperature is 98.5 F (36.9 C). Tonya [name] blood pressure is 147/74 (abnormal) and Tonya [name] pulse is 65. Tonya [name] respiration is 18 and oxygen saturation is 100%.   Wt  Readings from Last 3 Encounters:  12/10/19 183 lb 4 oz (83.1 kg)  11/19/19 181 lb (82.1 kg)  11/12/19 180 lb (81.6 kg)    Physical Exam Vitals reviewed.  HENT:     Head: Normocephalic and atraumatic.  Eyes:     Pupils: Pupils are equal, round, and reactive to light.  Cardiovascular:     Rate and Rhythm: Normal rate and regular rhythm.     Heart sounds: Normal heart sounds.  Pulmonary:     Effort: Pulmonary effort is normal.     Breath sounds: Normal breath sounds.  Abdominal:  General: Bowel sounds are normal.     Palpations: Abdomen is soft.  Musculoskeletal:        General: No tenderness or deformity. Normal range of motion.     Cervical back: Normal range of motion.  Lymphadenopathy:     Cervical: No cervical adenopathy.  Skin:    General: Skin is warm and dry.     Findings: No erythema or rash.  Neurological:     Mental Status: Tonya [name] is alert and oriented to person, place, and time.     Comments: Tonya [name] has some chronic weakness over on the left side from past stroke.  Psychiatric:        Behavior: Behavior normal.        Thought Content: Thought content normal.        Judgment: Judgment normal.      Lab Results  Component Value Date   WBC 5.7 12/10/2019   HGB 10.1 (L) 12/10/2019   HCT 32.9 (L) 12/10/2019   MCV 91.4 12/10/2019   PLT 192 12/10/2019   Lab Results  Component Value Date   FERRITIN 236 11/12/2019   IRON 35 (L) 11/12/2019   TIBC 243 11/12/2019   UIBC 207 11/12/2019   IRONPCTSAT 14 (L) 11/12/2019   Lab Results  Component Value Date   RETICCTPCT 1.5 05/17/2016   RBC 3.60 (L) 12/10/2019   RETICCTABS 62,400 05/17/2016   No results found for: KPAFRELGTCHN, LAMBDASER, KAPLAMBRATIO No results found for: IGGSERUM, IGA, IGMSERUM No results found for: Odetta Pink, SPEI   Chemistry      Component Value Date/Time   NA 144 11/19/2019 0800   NA 139 09/30/2018 1311   K 3.7 11/19/2019 0800   CL  108 11/19/2019 0800   CO2 30 11/19/2019 0800   BUN 15 11/19/2019 0800   BUN 10 09/30/2018 1311   CREATININE 0.72 11/19/2019 0800      Component Value Date/Time   CALCIUM 9.6 11/19/2019 0800   ALKPHOS 64 11/19/2019 0800   AST 17 11/19/2019 0800   ALT 13 11/19/2019 0800   BILITOT 0.5 11/19/2019 0800       Impression and Plan: Tonya [name] is a very pleasant 79 yo African American female with metastatic colon cancer.  Tonya [name] last PET scan was back in May.  We will have to set Tonya [name] up with a another 1.  We will get this before we treat Tonya [name].  We will go ahead with the 16th cycle of treatment.  Tonya [name] really has done nicely.  Hopefully, we will not see any kind of progression since we stopped the Avastin.  We will plan to get Tonya [name] back in 3 weeks.  This works out quite well so Tonya [name] will be off for Labor Day weekend and Tonya [name] hopefully enjoy it.  [name], MD 8/18/20218:54 AM

## 2019-12-12 ENCOUNTER — Inpatient Hospital Stay: Payer: Medicare HMO

## 2019-12-12 VITALS — BP 147/75 | HR 68 | Temp 98.5°F | Resp 18

## 2019-12-12 DIAGNOSIS — Z7901 Long term (current) use of anticoagulants: Secondary | ICD-10-CM | POA: Diagnosis not present

## 2019-12-12 DIAGNOSIS — D500 Iron deficiency anemia secondary to blood loss (chronic): Secondary | ICD-10-CM

## 2019-12-12 DIAGNOSIS — C182 Malignant neoplasm of ascending colon: Secondary | ICD-10-CM | POA: Diagnosis not present

## 2019-12-12 DIAGNOSIS — Z5111 Encounter for antineoplastic chemotherapy: Secondary | ICD-10-CM | POA: Diagnosis not present

## 2019-12-12 DIAGNOSIS — D509 Iron deficiency anemia, unspecified: Secondary | ICD-10-CM | POA: Diagnosis not present

## 2019-12-12 DIAGNOSIS — C189 Malignant neoplasm of colon, unspecified: Secondary | ICD-10-CM

## 2019-12-12 DIAGNOSIS — Z86718 Personal history of other venous thrombosis and embolism: Secondary | ICD-10-CM | POA: Diagnosis not present

## 2019-12-12 DIAGNOSIS — Z95828 Presence of other vascular implants and grafts: Secondary | ICD-10-CM

## 2019-12-12 DIAGNOSIS — Z79899 Other long term (current) drug therapy: Secondary | ICD-10-CM | POA: Diagnosis not present

## 2019-12-12 MED ORDER — SODIUM CHLORIDE 0.9% FLUSH
10.0000 mL | INTRAVENOUS | Status: DC | PRN
  Administered 2019-12-12: 10 mL via INTRAVENOUS
  Filled 2019-12-12: qty 10

## 2019-12-12 MED ORDER — HEPARIN SOD (PORK) LOCK FLUSH 100 UNIT/ML IV SOLN
500.0000 [IU] | Freq: Once | INTRAVENOUS | Status: DC | PRN
  Filled 2019-12-12: qty 5

## 2019-12-12 MED ORDER — SODIUM CHLORIDE 0.9 % IV SOLN
INTRAVENOUS | Status: DC
  Filled 2019-12-12: qty 250

## 2019-12-12 MED ORDER — SODIUM CHLORIDE 0.9% FLUSH
10.0000 mL | INTRAVENOUS | Status: DC | PRN
  Filled 2019-12-12: qty 10

## 2019-12-12 MED ORDER — COLD PACK MISC ONCOLOGY
1.0000 | Freq: Once | Status: DC | PRN
  Filled 2019-12-12: qty 1

## 2019-12-12 MED ORDER — SODIUM CHLORIDE 0.9% FLUSH
10.0000 mL | INTRAVENOUS | Status: DC | PRN
  Administered 2019-12-12: 10 mL
  Filled 2019-12-12: qty 10

## 2019-12-12 MED ORDER — HEPARIN SOD (PORK) LOCK FLUSH 100 UNIT/ML IV SOLN
500.0000 [IU] | Freq: Once | INTRAVENOUS | Status: AC
  Administered 2019-12-12: 500 [IU] via INTRAVENOUS
  Filled 2019-12-12: qty 5

## 2019-12-12 MED ORDER — SODIUM CHLORIDE 0.9 % IV SOLN
200.0000 mg | Freq: Once | INTRAVENOUS | Status: DC
  Administered 2019-12-12: 200 mg via INTRAVENOUS
  Filled 2019-12-12: qty 200

## 2019-12-12 NOTE — Patient Instructions (Signed)

## 2019-12-15 ENCOUNTER — Other Ambulatory Visit: Payer: Self-pay

## 2019-12-15 ENCOUNTER — Encounter: Payer: Self-pay | Admitting: Cardiology

## 2019-12-15 ENCOUNTER — Ambulatory Visit: Payer: Medicare HMO | Admitting: Cardiology

## 2019-12-15 VITALS — BP 118/61 | HR 73 | Resp 16 | Ht 71.0 in | Wt 184.0 lb

## 2019-12-15 DIAGNOSIS — R0609 Other forms of dyspnea: Secondary | ICD-10-CM

## 2019-12-15 DIAGNOSIS — I10 Essential (primary) hypertension: Secondary | ICD-10-CM

## 2019-12-15 DIAGNOSIS — I82532 Chronic embolism and thrombosis of left popliteal vein: Secondary | ICD-10-CM

## 2019-12-15 DIAGNOSIS — I493 Ventricular premature depolarization: Secondary | ICD-10-CM

## 2019-12-15 DIAGNOSIS — R0600 Dyspnea, unspecified: Secondary | ICD-10-CM | POA: Diagnosis not present

## 2019-12-15 NOTE — Patient Instructions (Signed)
Stop amlodipine.  See if the swelling gets better.  If the swelling is not better and blood pressure continues to increase with >130/80 mmHg, restart amlodipine at 5 mg daily and see if the leg swelling gets worse.  If indeed leg swelling gets worse, amlodipine may be contributing to her leg edema.

## 2019-12-15 NOTE — Progress Notes (Signed)
Primary Physician/Referring:  Leeroy Cha, MD  Patient ID: Tonya [name], female    DOB: 1940/05/08, 79 y.o.   MRN: [id number]  Chief Complaint  Patient presents with  . Follow-up    1 year  . Shortness of Breath  . Hypertension  . PVC    HPI: Tonya [name]  is a 79 y.o. female  with Hypertension, hyperlipidemia, uncontrolled diabetes mellitus, history of stroke involving the right pons in 2001 with moderate diffuse atherosclerotic changes in the intracerebral vessels when she presented with left-sided weakness and slurred speech, lumbosacral spondylosi, nonsmoker presents for follow-up of dizziness, dyspnea on exertion and abnormal EKG with frequent PVCs.  She has history of GI bleed on 10/18/2018 related to mid descending colonic cancer with liver metastasis.  She did not want surgery and she is presently undergoing chemotherapy.  In July 2021, she did have right leg DVT and is presently on Eliquis being managed by oncology.  Due to dizziness and NSVT noted on the event monitor and also worsening dyspnea she had undergone nuclear stress test on 10/08/2018 which was nonischemic and echocardiogram during that time had revealed no significant valvular abnormality with normal LVEF.  She now presents for annual visit.  She has no specific complaints from cardiac standpoint, denies dizziness, palpitations or chest pain.  Continues to have significant bilateral leg edema left is worse than the right.  Past Medical History:  Diagnosis Date  . Cerebrovascular accident ([place])   . Chronic edema    ? venous insufficiency  . COPD (chronic obstructive pulmonary disease) ([place])    never had a problem breathing  . Diabetes mellitus without complication ([place])   . Enlarged heart    per pt  . Gallstones   . GERD (gastroesophageal reflux disease)   . Goals of care, counseling/discussion 11/01/2018  . Hyperlipidemia   . Hypertension     Past Surgical History:  Procedure Laterality Date    . ABDOMINAL HYSTERECTOMY    . BIOPSY  10/18/2018   Procedure: BIOPSY;  Surgeon: Jackquline Denmark, MD;  Location[facility] ENDOSCOPY;  Service: Endoscopy;;  . CHOLECYSTECTOMY    . COLONOSCOPY WITH PROPOFOL N/A 10/18/2018   Procedure: COLONOSCOPY WITH PROPOFOL;  Surgeon: Jackquline Denmark, MD;  Location: [facility] ENDOSCOPY;  Service: Endoscopy;  Laterality: N/A;  . IR IMAGING GUIDED PORT INSERTION  11/11/2018  . POLYPECTOMY  10/18/2018   Procedure: POLYPECTOMY;  Surgeon: Jackquline Denmark, MD;  Location[facility] ENDOSCOPY;  Service: Endoscopy;;  . SUBMUCOSAL TATTOO INJECTION  10/18/2018   Procedure: SUBMUCOSAL TATTOO INJECTION;  Surgeon: Jackquline Denmark, MD;  Location: [facility] ENDOSCOPY;  Service: Endoscopy;;   Social History   Tobacco Use  . Smoking status: Former Smoker    Packs/day: 0.25    Years: 1.00    Pack years: 0.25    Quit date: 07/23/1968    Years since quitting: 51.4  . Smokeless tobacco: Never Used  Substance Use Topics  . Alcohol use: No    Alcohol/week: 0.0 standard drinks   Marital Status: Widowed   Review of Systems  Cardiovascular: Positive for leg swelling (chronic and mild). Negative for chest pain, claudication, dyspnea on exertion, orthopnea and syncope.  Musculoskeletal: Positive for joint swelling and muscle weakness (left arm and left leg since stroke).  Gastrointestinal: Negative for abdominal pain, anorexia and change in bowel habit.  Neurological: Positive for focal weakness (left arm and leg weakness), light-headedness and loss of balance.  Psychiatric/Behavioral: Negative for depression and substance abuse.  All other systems  reviewed and are negative.   Objective  Blood pressure 118/61, pulse 73, resp. rate 16, height 5\' 11"  (1.803 m), weight 184 lb (83.5 kg), SpO2 99 %. Body mass index is 25.66 kg/m.  Vitals with BMI 12/15/2019 12/15/2019 12/12/2019  Height - 5\' 11"  -  Weight - 184 lbs -  BMI - 94.76 -  Systolic 546 503 546  Diastolic 61 61 75  Pulse 73 77 68    Physical  Exam Constitutional:      Comments: Moderately built  Neck:     Thyroid: No thyromegaly.     Vascular: No JVD.  Cardiovascular:     Rate and Rhythm: Normal rate and regular rhythm. Occasional extrasystoles are present.    Pulses: Intact distal pulses.     Heart sounds: Normal heart sounds. No murmur heard.  No gallop.      Comments: 2-3+ left leg edema, 2+ right leg edema, pitting.  No JVD. Pulmonary:     Effort: Pulmonary effort is normal.     Breath sounds: Normal breath sounds.  Abdominal:     General: Bowel sounds are normal.     Palpations: Abdomen is soft.  Neurological:     Comments: Grossly left arm weakness and mild contracture noted, left lower extremity weakness present.    Radiology: No results found.  Laboratory examination:    CMP Latest Ref Rng & Units 12/10/2019 11/19/2019 11/12/2019  Glucose 70 - 99 mg/dL 165(H) 165(H) 177(H)  BUN 8 - 23 mg/dL 11 15 9   Creatinine 0.44 - 1.00 mg/dL 0.76 0.72 0.63  Sodium 135 - 145 mmol/L 142 144 144  Potassium 3.5 - 5.1 mmol/L 3.8 3.7 3.6  Chloride 98 - 111 mmol/L 108 108 108  CO2 22 - 32 mmol/L 29 30 29   Calcium 8.9 - 10.3 mg/dL 9.7 9.6 9.7  Total Protein 6.5 - 8.1 g/dL 5.6(L) 5.6(L) 5.8(L)  Total Bilirubin 0.3 - 1.2 mg/dL 0.6 0.5 0.6  Alkaline Phos 38 - 126 U/L 68 64 71  AST 15 - 41 U/L 15 17 16   ALT 0 - 44 U/L 14 13 13    CBC Latest Ref Rng & Units 12/10/2019 11/19/2019 11/12/2019  WBC 4.0 - 10.5 K/uL 5.7 6.1 6.9  Hemoglobin 12.0 - 15.0 g/dL 10.1(L) 10.5(L) 10.8(L)  Hematocrit 36 - 46 % 32.9(L) 34.5(L) 35.5(L)  Platelets 150 - 400 K/uL 192 208 227   Lipid Panel     Component Value Date/Time   CHOL 137 03/30/2018 1201   TRIG 80 03/30/2018 1201   HDL 49 03/30/2018 1201   CHOLHDL 2.8 03/30/2018 1201   CHOLHDL 3 07/17/2017 1227   VLDL 21.6 07/17/2017 1227   LDLCALC 72 03/30/2018 1201   HEMOGLOBIN A1C Lab Results  Component Value Date   HGBA1C 7.6 (A) 09/19/2019   TSH No results for input(s): TSH in the last  8760 hours.  PRN Meds:. Medications Discontinued During This Encounter  Medication Reason  . temazepam (RESTORIL) 7.5 MG capsule Ineffective  . KAPSPARGO SPRINKLE 25 MG CS24 Discontinued by provider  . amLODipine (NORVASC) 10 MG tablet Side effect (s)   Current Meds  Medication Sig  . Alcohol Swabs (B-D SINGLE USE SWABS REGULAR) PADS 1 each by Does not apply route as needed (to cleanse site prior to obtaining droplet of blood for CBG's). E11.9  . aspirin EC 81 MG tablet Take 81 mg by mouth daily.  Marland Kitchen atorvastatin (LIPITOR) 10 MG tablet Take 1 tablet (10 mg total) by mouth daily.  Marland Kitchen  B-D ULTRAFINE III SHORT PEN 31G X 8 MM MISC   . Cholecalciferol 2000 units TABS Take 1 tablet (2,000 Units total) by mouth daily.  Marland Kitchen dexamethasone (DECADRON) 4 MG tablet Take 2 tablets (8 mg total) by mouth 2 (two) times daily. For four days following chemotherapy.  Marland Kitchen ELIQUIS 5 MG TABS tablet TAKE 1 TABLET(5 MG) BY MOUTH TWICE DAILY  . famotidine (PEPCID) 40 MG tablet Take 1 tablet (40 mg total) by mouth 2 (two) times daily.  Marland Kitchen glucose blood (TRUE METRIX BLOOD GLUCOSE TEST) test strip 1 each by Other route 2 (two) times daily. E11.9  . insulin degludec (TRESIBA FLEXTOUCH) 100 UNIT/ML FlexTouch Pen Inject 0.21 mLs (21 Units total) into the skin daily. Take 34 units on chemo days  . lidocaine-prilocaine (EMLA) cream   . loperamide (IMODIUM A-D) 2 MG tablet Take 2 tablets (4 mg total) by mouth 4 (four) times daily as needed. Take 2 at diarrhea onset , then 1 every 2hr until 12hrs with no BM. May take 2 every 4hrs at night. If diarrhea recurs repeat.  . magic mouthwash SOLN Swish and swallow equal parts Benadryl, Lidocaine and Nystatin.  5- 10 ml.'s four times a day as needed.  . meclizine (ANTIVERT) 12.5 MG tablet TAKE 1 TABLET(12.5 MG) BY MOUTH THREE TIMES DAILY AS NEEDED FOR DIZZINESS  . metoprolol succinate (TOPROL-XL) 25 MG 24 hr tablet   . metoprolol tartrate (LOPRESSOR) 25 MG tablet Take 1 tablet (25 mg total)  by mouth 2 (two) times daily.  Marland Kitchen olmesartan (BENICAR) 20 MG tablet Take 1 tablet (20 mg total) by mouth daily.  . traMADol (ULTRAM) 50 MG tablet Take 1 tablet (50 mg total) by mouth every 6 (six) hours as needed.  . TRUEplus Lancets 30G MISC 1 each by Does not apply route 2 (two) times daily. E11.9  . [DISCONTINUED] amLODipine (NORVASC) 10 MG tablet Take 1 tablet (10 mg total) by mouth daily.  . [DISCONTINUED] prochlorperazine (COMPAZINE) 10 MG tablet Take 1 tablet (10 mg total) by mouth every 6 (six) hours as needed (Nausea or vomiting).    Cardiac Studies:   Holter Monitor for 24 hours  10/09/2016:  Normal sinus rhythm, sinus bradycardia and sinus tachycardia with heart rate ranging from 45-121bpm with average heart rate 69bpm.  Frequent PVCs, bigeminal PVCs, salvos and nonsustained ventricular tachycardia up to 5 beats in a ros  PVC load was 20%.  Occasional PACs and nonsustained atrial tachycardia up to 7 beats in a row.  Event monitor 30 days 07/30/2018: Predominant rhythm is normal sinus rhythm.  There were 3 runs of NSVT, maximum 13 beats, minimum 3 beats, asymptomatic.  Frequent PVCs, ventricular ectopic burden 5%.  There are occasional PACs.  Echocardiogram 08/28/2018: Left ventricle cavity is normal in size. Moderate concentric hypertrophy of the left ventricle. Normal global wall motion. Doppler evidence of grade II (pseudonormal) diastolic dysfunction, elevated LAP. Calculated EF 53%. Mild (Grade I) mitral regurgitation. Mild tricuspid regurgitation. Estimated pulmonary artery systolic pressure 26 mmHg. Mild pulmonic regurgitation. Compared to 10/09/16, previously mild LVH.   Lexiscan Myoview stress test 10/08/2018: Lexiscan stress test was performed. Stress EKG is non-diagnostic, as this is pharmacological stress test. Myocardial pefusion imaging is normal. LVEF 65%. Low risk study.  No significant change from  10/09/2016.  Lower Extremity Venous Duplex   10/01/2019: Nonocclusive deep venous thrombus identified within the LEFT popliteal and LEFT peroneal veins. No evidence of deep venous thrombosis in the RIGHT lower extremity.   Lower Extremity Venous  Duplex left leg  11/12/2019: Redemonstration of nonocclusive left popliteal vein DVT. No evidence of extension, and no evidence of proximal DVT.  Tibial veins appear patent with no DVT.  Assessment     ICD-10-CM   1. Frequent PVCs  I49.3 EKG 12-Lead  2. Essential hypertension  I10   3. Dyspnea on exertion  R06.00   4. Chronic deep vein thrombosis (DVT) of popliteal vein of left lower extremity (HCC)  I82.532     EKG 05/31/2018: Sinus rhythm with first-degree AV block at rate of 56 bpm, left axis deviation, left anterior fascicular block.  Cannot exclude inferior infarct old, anteroseptal infarct old.  No evidence of ischemia.  EKG 09/19/2016: Normal sinus rhythm at the rate of 85 bpm, left axis deviation, poor R-wave progression, frequent PVCs in the form of ventricular bigeminy, ventricular couplets and triplets.  Recommendations:   Tonya [name]  is a 79 y.o. female  with Hypertension, hyperlipidemia, uncontrolled diabetes mellitus, history of stroke involving the right pons in 2001 with moderate diffuse atherosclerotic changes in the intracerebral vessels when she presented with left-sided weakness and slurred speech, lumbosacral spondylosi, nonsmoker, h/o GI bleed on 10/18/2018 and anemia due to descending colonic cancer with liver metastasis and presently on chemotherapy and h/o DVT in July 2021 and now on Eliquis presents for follow-up of dizziness, dyspnea on exertion and abnormal EKG with frequent PVCs.   Due to dizziness and NSVT noted on the event monitor and also worsening dyspnea she had undergone nuclear stress test on 10/08/2018 which was nonischemic and echocardiogram during that time had revealed no significant valvular abnormality with normal LVEF.  She now presents for annual  visit.  From cardiac standpoint she remains stable without any further episodes of palpitations or dizziness or syncope.  I have prescribed her support stockings for her leg edema, I will also discontinue amlodipine which may be contributing to her bilateral leg edema although the DVT is localized to the left leg.  Medication reconciliation is incomplete.  They did not bring the medications with them.  I have advised him to marked the medications when they go home and to take it to the PCP.  Otherwise stable from cardiac standpoint, I will see her back on a as needed basis.   Adrian Prows, MD, [facility] 12/15/2019, 12:44 PM Office: [phone]

## 2019-12-16 ENCOUNTER — Ambulatory Visit: Payer: Self-pay | Admitting: Podiatry

## 2019-12-17 ENCOUNTER — Ambulatory Visit: Payer: Medicare HMO | Admitting: Podiatry

## 2019-12-17 ENCOUNTER — Other Ambulatory Visit: Payer: Self-pay

## 2019-12-17 ENCOUNTER — Encounter: Payer: Self-pay | Admitting: Podiatry

## 2019-12-17 DIAGNOSIS — I69359 Hemiplegia and hemiparesis following cerebral infarction affecting unspecified side: Secondary | ICD-10-CM | POA: Diagnosis not present

## 2019-12-17 DIAGNOSIS — Z794 Long term (current) use of insulin: Secondary | ICD-10-CM

## 2019-12-17 DIAGNOSIS — M79674 Pain in right toe(s): Secondary | ICD-10-CM

## 2019-12-17 DIAGNOSIS — E1149 Type 2 diabetes mellitus with other diabetic neurological complication: Secondary | ICD-10-CM | POA: Diagnosis not present

## 2019-12-17 DIAGNOSIS — R600 Localized edema: Secondary | ICD-10-CM

## 2019-12-17 DIAGNOSIS — M79675 Pain in left toe(s): Secondary | ICD-10-CM

## 2019-12-17 DIAGNOSIS — B351 Tinea unguium: Secondary | ICD-10-CM

## 2019-12-17 NOTE — Progress Notes (Signed)
  Subjective:  Patient ID: Tonya [name], female    DOB: [birth year],  MRN: [id number]  Chief Complaint  Patient presents with  . Nail Problem    trim nails     79 y.o. female presents with the above complaint. History confirmed with patient.  Here with her daughter today.  Her PCP wanted [place] to evaluate her foot for a diabetic foot exam as well as inspect the toenails which are thickened and ingrowing and curving into the borders.  The left hallux at one point was digging the skin of to cause some drainage.  They put ointment on it and it tended to resolve on its own.  She has a skin amount of swelling today worse than usual.  Is worse in the left side because she had had a history of a stroke and has weakness on the left side.  They just got compression stockings but have not really been able to wear them very well.  Objective:  Physical Exam: Bilaterally there is +2 on the right and +3 on the left pitting edema.  Nonpalpable pedal pulses secondary to this edema.  Skin is warm well perfused with normal capillary refill time.  There is onychomycosis to all 10 toenails with incurvation and pincer nail deformity of the hallux toenails and pain on palpation.  No preulcerative calluses.  No prominence.  She has notable weakness of the left side compared to the right.  Assessment:   1. Onychomycosis   2. Pain due to onychomycosis of toenails of both feet   3. Type 2 diabetes mellitus with other neurologic complication, with long-term current use of insulin ([place])   4. Leg edema, left   5. CVA, old, hemiparesis ([place])      Plan:  Patient was evaluated and treated and all questions answered.  Patient educated on diabetes. Discussed proper diabetic foot care and discussed risks and complications of disease. Educated patient in depth on reasons to return to the office immediately should he/she discover anything concerning or new on the feet. All questions answered. Discussed proper shoes as well.     Discussed the etiology and treatment options for the condition in detail with the patient. Educated patient on the topical and oral treatment options for mycotic nails. Recommended debridement of the nails today. Sharp and mechanical debridement performed of all painful and mycotic nails today. Nails debrided in length and thickness using a nail nipper and a mechanical burr to level of comfort. Discussed treatment options including appropriate shoe gear. Follow up as needed for painful nails.   Discussed the issue of the edema with him.  They are having difficulty getting the compression stockings on.  I recommend they look into having a stocking holder to try to put that on versus CircAid type Velcro compression garments.   Return in about 3 months (around 03/18/2020) for [facility].

## 2019-12-24 ENCOUNTER — Encounter (HOSPITAL_COMMUNITY)
Admission: RE | Admit: 2019-12-24 | Discharge: 2019-12-24 | Disposition: A | Discharge status: Home / Self Care | Payer: Medicare HMO | Source: Ambulatory Visit | Attending: Hematology & Oncology | Admitting: Hematology & Oncology

## 2019-12-24 ENCOUNTER — Other Ambulatory Visit: Payer: Self-pay

## 2019-12-24 DIAGNOSIS — D500 Iron deficiency anemia secondary to blood loss (chronic): Secondary | ICD-10-CM | POA: Diagnosis not present

## 2019-12-24 DIAGNOSIS — C189 Malignant neoplasm of colon, unspecified: Secondary | ICD-10-CM | POA: Diagnosis not present

## 2019-12-24 DIAGNOSIS — J90 Pleural effusion, not elsewhere classified: Secondary | ICD-10-CM | POA: Diagnosis not present

## 2019-12-24 DIAGNOSIS — C772 Secondary and unspecified malignant neoplasm of intra-abdominal lymph nodes: Secondary | ICD-10-CM | POA: Insufficient documentation

## 2019-12-24 DIAGNOSIS — R160 Hepatomegaly, not elsewhere classified: Secondary | ICD-10-CM | POA: Insufficient documentation

## 2019-12-24 DIAGNOSIS — C787 Secondary malignant neoplasm of liver and intrahepatic bile duct: Secondary | ICD-10-CM | POA: Insufficient documentation

## 2019-12-24 DIAGNOSIS — K7689 Other specified diseases of liver: Secondary | ICD-10-CM | POA: Diagnosis not present

## 2019-12-24 DIAGNOSIS — C182 Malignant neoplasm of ascending colon: Secondary | ICD-10-CM | POA: Diagnosis not present

## 2019-12-24 DIAGNOSIS — I2510 Atherosclerotic heart disease of native coronary artery without angina pectoris: Secondary | ICD-10-CM | POA: Diagnosis not present

## 2019-12-24 LAB — GLUCOSE, CAPILLARY: Glucose-Capillary: 102 mg/dL — ABNORMAL HIGH (ref 70–99)

## 2019-12-24 MED ORDER — FLUDEOXYGLUCOSE F - 18 (FDG) INJECTION
9.4000 | Freq: Once | INTRAVENOUS | Status: AC | PRN
  Administered 2019-12-24: 9.4 via INTRAVENOUS

## 2019-12-25 ENCOUNTER — Telehealth: Payer: Self-pay | Admitting: *Deleted

## 2019-12-25 NOTE — Telephone Encounter (Signed)
Received call from Ninetta Lights daughter asking about what the PET scan results mean.  Dr Marin Olp informed of results and wants to compare these results with previous results.  Told patient either myself or Dr Marin Olp will be in touch. Daughter appreciative of call

## 2019-12-31 ENCOUNTER — Inpatient Hospital Stay (HOSPITAL_BASED_OUTPATIENT_CLINIC_OR_DEPARTMENT_OTHER): Payer: Medicare HMO | Admitting: Hematology & Oncology

## 2019-12-31 ENCOUNTER — Encounter: Payer: Self-pay | Admitting: Hematology & Oncology

## 2019-12-31 ENCOUNTER — Inpatient Hospital Stay: Payer: Medicare HMO

## 2019-12-31 ENCOUNTER — Inpatient Hospital Stay: Payer: Medicare HMO | Attending: Hematology & Oncology

## 2019-12-31 ENCOUNTER — Other Ambulatory Visit: Payer: Self-pay

## 2019-12-31 ENCOUNTER — Telehealth: Payer: Self-pay | Admitting: Hematology & Oncology

## 2019-12-31 VITALS — BP 141/65 | HR 83 | Temp 98.0°F | Resp 18

## 2019-12-31 DIAGNOSIS — Z7982 Long term (current) use of aspirin: Secondary | ICD-10-CM | POA: Diagnosis not present

## 2019-12-31 DIAGNOSIS — D509 Iron deficiency anemia, unspecified: Secondary | ICD-10-CM | POA: Diagnosis not present

## 2019-12-31 DIAGNOSIS — Z7901 Long term (current) use of anticoagulants: Secondary | ICD-10-CM | POA: Diagnosis not present

## 2019-12-31 DIAGNOSIS — C189 Malignant neoplasm of colon, unspecified: Secondary | ICD-10-CM

## 2019-12-31 DIAGNOSIS — Z9221 Personal history of antineoplastic chemotherapy: Secondary | ICD-10-CM | POA: Insufficient documentation

## 2019-12-31 DIAGNOSIS — Z79899 Other long term (current) drug therapy: Secondary | ICD-10-CM | POA: Diagnosis not present

## 2019-12-31 DIAGNOSIS — C787 Secondary malignant neoplasm of liver and intrahepatic bile duct: Secondary | ICD-10-CM | POA: Diagnosis not present

## 2019-12-31 DIAGNOSIS — D500 Iron deficiency anemia secondary to blood loss (chronic): Secondary | ICD-10-CM

## 2019-12-31 DIAGNOSIS — C182 Malignant neoplasm of ascending colon: Secondary | ICD-10-CM | POA: Insufficient documentation

## 2019-12-31 DIAGNOSIS — Z86711 Personal history of pulmonary embolism: Secondary | ICD-10-CM | POA: Diagnosis present

## 2019-12-31 DIAGNOSIS — Z86718 Personal history of other venous thrombosis and embolism: Secondary | ICD-10-CM | POA: Insufficient documentation

## 2019-12-31 LAB — CBC WITH DIFFERENTIAL (CANCER CENTER ONLY)
Abs Immature Granulocytes: 0.04 10*3/uL (ref 0.00–0.07)
Basophils Absolute: 0 10*3/uL (ref 0.0–0.1)
Basophils Relative: 0 %
Eosinophils Absolute: 0.1 10*3/uL (ref 0.0–0.5)
Eosinophils Relative: 1 %
HCT: 32.3 % — ABNORMAL LOW (ref 36.0–46.0)
Hemoglobin: 9.7 g/dL — ABNORMAL LOW (ref 12.0–15.0)
Immature Granulocytes: 1 %
Lymphocytes Relative: 25 %
Lymphs Abs: 1.5 10*3/uL (ref 0.7–4.0)
MCH: 28 pg (ref 26.0–34.0)
MCHC: 30 g/dL (ref 30.0–36.0)
MCV: 93.4 fL (ref 80.0–100.0)
Monocytes Absolute: 0.7 10*3/uL (ref 0.1–1.0)
Monocytes Relative: 12 %
Neutro Abs: 3.6 10*3/uL (ref 1.7–7.7)
Neutrophils Relative %: 61 %
Platelet Count: 242 10*3/uL (ref 150–400)
RBC: 3.46 MIL/uL — ABNORMAL LOW (ref 3.87–5.11)
RDW: 17.7 % — ABNORMAL HIGH (ref 11.5–15.5)
WBC Count: 5.9 10*3/uL (ref 4.0–10.5)
nRBC: 0 % (ref 0.0–0.2)

## 2019-12-31 LAB — CMP (CANCER CENTER ONLY)
ALT: 12 U/L (ref 0–44)
AST: 15 U/L (ref 15–41)
Albumin: 3.5 g/dL (ref 3.5–5.0)
Alkaline Phosphatase: 59 U/L (ref 38–126)
Anion gap: 7 (ref 5–15)
BUN: 11 mg/dL (ref 8–23)
CO2: 28 mmol/L (ref 22–32)
Calcium: 9.5 mg/dL (ref 8.9–10.3)
Chloride: 108 mmol/L (ref 98–111)
Creatinine: 0.74 mg/dL (ref 0.44–1.00)
GFR, Est AFR Am: >60 mL/min (ref >60)
GFR, Estimated: >60 mL/min (ref >60)
Glucose, Bld: 144 mg/dL — ABNORMAL HIGH (ref 70–99)
Potassium: 3.8 mmol/L (ref 3.5–5.1)
Sodium: 143 mmol/L (ref 135–145)
Total Bilirubin: 0.6 mg/dL (ref 0.3–1.2)
Total Protein: 5.4 g/dL — ABNORMAL LOW (ref 6.5–8.1)

## 2019-12-31 LAB — IRON AND TIBC
Iron: 29 ug/dL — ABNORMAL LOW (ref 41–142)
Saturation Ratios: 11 % — ABNORMAL LOW (ref 21–57)
TIBC: 266 ug/dL (ref 236–444)
UIBC: 237 ug/dL (ref 120–384)

## 2019-12-31 LAB — FERRITIN: Ferritin: 276 ng/mL (ref 11–307)

## 2019-12-31 LAB — CEA (IN HOUSE-CHCC): CEA (CHCC-In House): 6.05 ng/mL — ABNORMAL HIGH (ref 0.00–5.00)

## 2019-12-31 MED ORDER — HEPARIN SOD (PORK) LOCK FLUSH 100 UNIT/ML IV SOLN
500.0000 [IU] | Freq: Once | INTRAVENOUS | Status: AC | PRN
  Administered 2019-12-31: 500 [IU]
  Filled 2019-12-31: qty 5

## 2019-12-31 MED ORDER — SODIUM CHLORIDE 0.9% FLUSH
10.0000 mL | INTRAVENOUS | Status: DC | PRN
  Administered 2019-12-31: 10 mL
  Filled 2019-12-31: qty 10

## 2019-12-31 NOTE — Telephone Encounter (Signed)
Per 9/8 los appointments were already scheduled.  I only removed infusion appts due to 9/8 los

## 2019-12-31 NOTE — Progress Notes (Signed)
Hematology and Oncology Follow Up Visit  Tonya [name] [id number] 09-17-1940 79 y.o. 12/31/2019   Principle Diagnosis:  Stage IV adenocarcinoma of the ascending colon -- NRAS+, BRAF-, HER2-, MSI/MMR -,  Iron deficiency anemia DVT in LEFT leg-- Peroneal and popliteal vein  Past Therapy: FOLFOX - s/p cycle5  Current Therapy: FOLFIRI/Bevacizumab -started 01/20/2019,s/p cycle #16 -- Avastin d/c'ed on 10/21/2019 due to DVT IV Iron as indicated Eliquis 5 mg po BID -- started on 09/30/2019   Interim History:  Tonya [name] is here today with her daughter for follow-up.  Unfortunately, I think that we are looking at progressive disease.  She had a recent PET scan.  The PET scan showed that there was increase in disease.  She had increase in the primary lesion in the ascending colon.  There was thickening.  This measured 4.7 cm in length.  The SUV was 45.  She did have the right hepatic lobe mass measuring 5.5 x 4.8 cm.  This was not avid.  This was increased in size.  No other areas of hypermetabolism was noted.  There was a ileocolic lymph node of 9 mm.  This was slightly metabolic at 7.2 SUV.  At this point, I really think that we need to consider surgery for resection of this apple core lesion in the right colon.  Thankfully, it is in the right side of the colon so that obstruction probably would not be as much of a problem.  However, I think if obstruction did occur, Tonya [name] would really be in trouble.  I realize that she does have metastatic disease.  I think that she would qualify as oligometastatic disease.  As such, I think it would be worthwhile to resect out the primary.  I really do not think that she would able to tolerate any kind of liver resection.  I realize that she does have other health issues.  The major health issue is a past CVA with some weakness over on the left side.  She has tolerated chemotherapy incredibly well.  As such, I think that she would be able to  tolerate surgery.  Her last CEA was 6.05.  This is been trending upward a little bit.  She did have a good Labor Day weekend.  She does seem to be eating fairly well..  She is on Eliquis.  She did have the DVT in her left leg.  Overall, her performance status is ECOG 1.    Medications:  Allergies as of 12/31/2019      Reactions   Hydrocodone Swelling, Other (See Comments)   "I started swelling, became red, and passed out"   Iohexol Hives, Shortness Of Breath, Other (See Comments)   Patient developed hives and fullness in throat post injection of 125cc's Omni 300, Onset Date: 11/15/2006   Naproxen Other (See Comments)   "It made me feel out of my head"   Ibuprofen Nausea Only   Propoxyphene N-acetaminophen Other (See Comments)   "I couldn't find the door to make my way out of the room- I was in misery"      Medication List       Accurate as of December 31, 2019  9:36 AM. If you have any questions, ask your nurse or doctor.        aspirin EC 81 MG tablet Take 81 mg by mouth daily.   atorvastatin 10 MG tablet Commonly known as: LIPITOR Take 1 tablet (10 mg total) by mouth daily.   B-D SINGLE  USE SWABS REGULAR Pads 1 each by Does not apply route as needed (to cleanse site prior to obtaining droplet of blood for CBG's). E11.9   B-D ULTRAFINE III SHORT PEN 31G X 8 MM Misc Generic drug: Insulin Pen Needle   Cholecalciferol 50 MCG (2000 UT) Tabs Take 1 tablet (2,000 Units total) by mouth daily.   dexamethasone 4 MG tablet Commonly known as: DECADRON Take 2 tablets (8 mg total) by mouth 2 (two) times daily. For four days following chemotherapy.   Eliquis 5 MG Tabs tablet Generic drug: apixaban TAKE 1 TABLET(5 MG) BY MOUTH TWICE DAILY   famotidine 40 MG tablet Commonly known as: Pepcid Take 1 tablet (40 mg total) by mouth 2 (two) times daily.   lidocaine-prilocaine cream Commonly known as: EMLA   loperamide 2 MG tablet Commonly known as: Imodium A-D Take 2 tablets (4  mg total) by mouth 4 (four) times daily as needed. Take 2 at diarrhea onset , then 1 every 2hr until 12hrs with no BM. May take 2 every 4hrs at night. If diarrhea recurs repeat.   losartan 50 MG tablet Commonly known as: COZAAR Take 50 mg by mouth daily.   magic mouthwash Soln Swish and swallow equal parts Benadryl, Lidocaine and Nystatin.  5- 10 ml.'s four times a day as needed.   meclizine 12.5 MG tablet Commonly known as: ANTIVERT TAKE 1 TABLET(12.5 MG) BY MOUTH THREE TIMES DAILY AS NEEDED FOR DIZZINESS   metoprolol succinate 25 MG 24 hr tablet Commonly known as: TOPROL-XL   metoprolol tartrate 25 MG tablet Commonly known as: LOPRESSOR Take 1 tablet (25 mg total) by mouth 2 (two) times daily.   olmesartan 20 MG tablet Commonly known as: BENICAR Take 1 tablet (20 mg total) by mouth daily.   traMADol 50 MG tablet Commonly known as: ULTRAM Take 1 tablet (50 mg total) by mouth every 6 (six) hours as needed.   Tyler Aas FlexTouch 100 UNIT/ML FlexTouch Pen Generic drug: insulin degludec Inject 0.21 mLs (21 Units total) into the skin daily. Take 34 units on chemo days   True Metrix Blood Glucose Test test strip Generic drug: glucose blood 1 each by Other route 2 (two) times daily. E11.9   TRUEplus Lancets 30G Misc 1 each by Does not apply route 2 (two) times daily. E11.9       Allergies:  Allergies  Allergen Reactions  . Hydrocodone Swelling and Other (See Comments)    "I started swelling, became red, and passed out"  . Iohexol Hives, Shortness Of Breath and Other (See Comments)    Patient developed hives and fullness in throat post injection of 125cc's Omni 300, Onset Date: 11/15/2006   . Naproxen Other (See Comments)    "It made me feel out of my head"  . Ibuprofen Nausea Only  . Propoxyphene N-Acetaminophen Other (See Comments)    "I couldn't find the door to make my way out of the room- I was in misery"    Past Medical History, Surgical history, Social history,  and Family History were reviewed and updated.  Review of Systems: Review of Systems  Constitutional: Negative.   HENT: Negative.   Eyes: Negative.   Respiratory: Negative.   Cardiovascular: Negative.   Gastrointestinal: Negative.   Genitourinary: Negative.   Musculoskeletal: Negative.   Skin: Negative.   Neurological: Negative.   Endo/Heme/Allergies: Negative.   Psychiatric/Behavioral: Negative.      Physical Exam:  oral temperature is 98 F (36.7 C). Her blood pressure is 141/65 (  abnormal) and her pulse is 83. Her respiration is 18 and oxygen saturation is 100%.   Wt Readings from Last 3 Encounters:  12/15/19 184 lb (83.5 kg)  12/10/19 183 lb 4 oz (83.1 kg)  11/19/19 181 lb (82.1 kg)    Physical Exam Vitals reviewed.  HENT:     Head: Normocephalic and atraumatic.  Eyes:     Pupils: Pupils are equal, round, and reactive to light.  Cardiovascular:     Rate and Rhythm: Normal rate and regular rhythm.     Heart sounds: Normal heart sounds.  Pulmonary:     Effort: Pulmonary effort is normal.     Breath sounds: Normal breath sounds.  Abdominal:     General: Bowel sounds are normal.     Palpations: Abdomen is soft.  Musculoskeletal:        General: No tenderness or deformity. Normal range of motion.     Cervical back: Normal range of motion.  Lymphadenopathy:     Cervical: No cervical adenopathy.  Skin:    General: Skin is warm and dry.     Findings: No erythema or rash.  Neurological:     Mental Status: She is alert and oriented to person, place, and time.     Comments: She has some chronic weakness over on the left side from past stroke.  Psychiatric:        Behavior: Behavior normal.        Thought Content: Thought content normal.        Judgment: Judgment normal.      Lab Results  Component Value Date   WBC 5.9 12/31/2019   HGB 9.7 (L) 12/31/2019   HCT 32.3 (L) 12/31/2019   MCV 93.4 12/31/2019   PLT 242 12/31/2019   Lab Results  Component Value  Date   FERRITIN 360 (H) 12/10/2019   IRON 37 (L) 12/10/2019   TIBC 261 12/10/2019   UIBC 224 12/10/2019   IRONPCTSAT 14 (L) 12/10/2019   Lab Results  Component Value Date   RETICCTPCT 1.5 05/17/2016   RBC 3.46 (L) 12/31/2019   RETICCTABS 62,400 05/17/2016   No results found for: KPAFRELGTCHN, LAMBDASER, KAPLAMBRATIO No results found for: IGGSERUM, IGA, IGMSERUM No results found for: Odetta Pink, SPEI   Chemistry      Component Value Date/Time   NA 142 12/10/2019 0835   NA 139 09/30/2018 1311   K 3.8 12/10/2019 0835   CL 108 12/10/2019 0835   CO2 29 12/10/2019 0835   BUN 11 12/10/2019 0835   BUN 10 09/30/2018 1311   CREATININE 0.76 12/10/2019 0835      Component Value Date/Time   CALCIUM 9.7 12/10/2019 0835   ALKPHOS 68 12/10/2019 0835   AST 15 12/10/2019 0835   ALT 14 12/10/2019 0835   BILITOT 0.6 12/10/2019 0835       Impression and Plan: Ms. Finger is a very pleasant 79 yo African American female with metastatic colon cancer.  Again, she does have progressive disease.  She has done quite well with the FOLFIRI regimen.  As such, I do think that if she is able to have surgery, she would be a good candidate for surgical resection of the primary.  If we could get a specimen from the primary lesion, we can send it off for molecular markers and see if we might be able to use targeted therapy.  I spoke with Dr. Alphonsa Overall of [facility].  He  will see Ms. Forinash in a couple weeks.  I know with the COVID, surgery schedule are a little bit off.  I do not see a problem with Ms. Stotz being off treatment right now.  She has not had Avastin for several months because of the history of DVT.  Hopefully, Dr. Lucia Gaskins will be able to operate on this more.  Again I think taking out the primary will certainly be of value.  As far as the liver lesion, I am not sure if this is small enough to be able to do radiosurgery.   I wonder if some type of intrahepatic therapy may be reasonable to consider.  Hopefully, we will be able to see her in the hospital if she does have surgery.  I figure if she has surgery, she will be in the hospital for probably for 5 days.  I spent a good 40 minutes or so with [name] and her daughter.  As always we had a good prayer.  Her faith remains incredibly strong.  She does have a strong constitution.     [name], MD 9/8/20219:36 AM

## 2019-12-31 NOTE — Patient Instructions (Signed)

## 2020-01-01 ENCOUNTER — Other Ambulatory Visit: Payer: Self-pay | Admitting: *Deleted

## 2020-01-01 DIAGNOSIS — C787 Secondary malignant neoplasm of liver and intrahepatic bile duct: Secondary | ICD-10-CM

## 2020-01-01 DIAGNOSIS — C189 Malignant neoplasm of colon, unspecified: Secondary | ICD-10-CM

## 2020-01-02 ENCOUNTER — Inpatient Hospital Stay: Payer: Medicare HMO

## 2020-01-02 DIAGNOSIS — Z8673 Personal history of transient ischemic attack (TIA), and cerebral infarction without residual deficits: Secondary | ICD-10-CM | POA: Diagnosis not present

## 2020-01-02 DIAGNOSIS — E1169 Type 2 diabetes mellitus with other specified complication: Secondary | ICD-10-CM | POA: Diagnosis not present

## 2020-01-02 DIAGNOSIS — Z794 Long term (current) use of insulin: Secondary | ICD-10-CM | POA: Diagnosis not present

## 2020-01-02 DIAGNOSIS — L304 Erythema intertrigo: Secondary | ICD-10-CM | POA: Diagnosis not present

## 2020-01-02 DIAGNOSIS — I82502 Chronic embolism and thrombosis of unspecified deep veins of left lower extremity: Secondary | ICD-10-CM | POA: Diagnosis not present

## 2020-01-02 DIAGNOSIS — I10 Essential (primary) hypertension: Secondary | ICD-10-CM | POA: Diagnosis not present

## 2020-01-05 ENCOUNTER — Inpatient Hospital Stay: Payer: Medicare HMO

## 2020-01-05 ENCOUNTER — Other Ambulatory Visit: Payer: Self-pay

## 2020-01-05 VITALS — BP 137/72 | HR 78 | Temp 98.7°F | Resp 16

## 2020-01-05 DIAGNOSIS — Z86718 Personal history of other venous thrombosis and embolism: Secondary | ICD-10-CM | POA: Diagnosis not present

## 2020-01-05 DIAGNOSIS — Z9221 Personal history of antineoplastic chemotherapy: Secondary | ICD-10-CM | POA: Diagnosis not present

## 2020-01-05 DIAGNOSIS — Z7982 Long term (current) use of aspirin: Secondary | ICD-10-CM | POA: Diagnosis not present

## 2020-01-05 DIAGNOSIS — C182 Malignant neoplasm of ascending colon: Secondary | ICD-10-CM | POA: Diagnosis not present

## 2020-01-05 DIAGNOSIS — Z7901 Long term (current) use of anticoagulants: Secondary | ICD-10-CM | POA: Diagnosis not present

## 2020-01-05 DIAGNOSIS — D509 Iron deficiency anemia, unspecified: Secondary | ICD-10-CM | POA: Diagnosis not present

## 2020-01-05 DIAGNOSIS — D500 Iron deficiency anemia secondary to blood loss (chronic): Secondary | ICD-10-CM

## 2020-01-05 DIAGNOSIS — C189 Malignant neoplasm of colon, unspecified: Secondary | ICD-10-CM

## 2020-01-05 DIAGNOSIS — Z79899 Other long term (current) drug therapy: Secondary | ICD-10-CM | POA: Diagnosis not present

## 2020-01-05 MED ORDER — HEPARIN SOD (PORK) LOCK FLUSH 100 UNIT/ML IV SOLN
500.0000 [IU] | Freq: Once | INTRAVENOUS | Status: AC | PRN
  Administered 2020-01-05: 500 [IU]
  Filled 2020-01-05: qty 5

## 2020-01-05 MED ORDER — SODIUM CHLORIDE 0.9% FLUSH
10.0000 mL | Freq: Once | INTRAVENOUS | Status: AC | PRN
  Administered 2020-01-05: 10 mL
  Filled 2020-01-05: qty 10

## 2020-01-05 MED ORDER — SODIUM CHLORIDE 0.9 % IV SOLN
INTRAVENOUS | Status: DC
  Filled 2020-01-05: qty 250

## 2020-01-05 MED ORDER — SODIUM CHLORIDE 0.9 % IV SOLN
200.0000 mg | Freq: Once | INTRAVENOUS | Status: AC
  Administered 2020-01-05: 200 mg via INTRAVENOUS
  Filled 2020-01-05: qty 200

## 2020-01-05 NOTE — Patient Instructions (Signed)

## 2020-01-09 ENCOUNTER — Other Ambulatory Visit: Payer: Self-pay

## 2020-01-09 ENCOUNTER — Inpatient Hospital Stay: Payer: Medicare HMO

## 2020-01-09 VITALS — BP 141/67 | HR 75 | Temp 98.5°F | Resp 17

## 2020-01-09 DIAGNOSIS — Z79899 Other long term (current) drug therapy: Secondary | ICD-10-CM | POA: Diagnosis not present

## 2020-01-09 DIAGNOSIS — Z9221 Personal history of antineoplastic chemotherapy: Secondary | ICD-10-CM | POA: Diagnosis not present

## 2020-01-09 DIAGNOSIS — Z7982 Long term (current) use of aspirin: Secondary | ICD-10-CM | POA: Diagnosis not present

## 2020-01-09 DIAGNOSIS — D509 Iron deficiency anemia, unspecified: Secondary | ICD-10-CM | POA: Diagnosis not present

## 2020-01-09 DIAGNOSIS — D500 Iron deficiency anemia secondary to blood loss (chronic): Secondary | ICD-10-CM

## 2020-01-09 DIAGNOSIS — Z7901 Long term (current) use of anticoagulants: Secondary | ICD-10-CM | POA: Diagnosis not present

## 2020-01-09 DIAGNOSIS — C182 Malignant neoplasm of ascending colon: Secondary | ICD-10-CM | POA: Diagnosis not present

## 2020-01-09 DIAGNOSIS — Z86718 Personal history of other venous thrombosis and embolism: Secondary | ICD-10-CM | POA: Diagnosis not present

## 2020-01-09 MED ORDER — SODIUM CHLORIDE 0.9 % IV SOLN
INTRAVENOUS | Status: DC
  Filled 2020-01-09: qty 250

## 2020-01-09 MED ORDER — SODIUM CHLORIDE 0.9% FLUSH
10.0000 mL | Freq: Once | INTRAVENOUS | Status: AC | PRN
  Administered 2020-01-09: 10 mL
  Filled 2020-01-09: qty 10

## 2020-01-09 MED ORDER — HEPARIN SOD (PORK) LOCK FLUSH 100 UNIT/ML IV SOLN
500.0000 [IU] | Freq: Once | INTRAVENOUS | Status: AC | PRN
  Administered 2020-01-09: 500 [IU]
  Filled 2020-01-09: qty 5

## 2020-01-09 MED ORDER — SODIUM CHLORIDE 0.9 % IV SOLN
200.0000 mg | Freq: Once | INTRAVENOUS | Status: AC
  Administered 2020-01-09: 200 mg via INTRAVENOUS
  Filled 2020-01-09: qty 200

## 2020-01-09 NOTE — Patient Instructions (Signed)
Iron Sucrose injection (Venofer)  What is this medicine? IRON SUCROSE (AHY ern SOO krohs) is an iron complex. Iron is used to make healthy red blood cells, which carry oxygen and nutrients throughout the body. This medicine is used to treat iron deficiency anemia in people with chronic kidney disease. This medicine may be used for other purposes; ask your health care provider or pharmacist if you have questions. COMMON BRAND NAME(S): Venofer What should I tell my health care provider before I take this medicine? They need to know if you have any of these conditions:  anemia not caused by low iron levels  heart disease  high levels of iron in the blood  kidney disease  liver disease  an unusual or allergic reaction to iron, other medicines, foods, dyes, or preservatives  pregnant or trying to get pregnant  breast-feeding How should I use this medicine? This medicine is for infusion into a vein. It is given by a health care professional in a hospital or clinic setting. Talk to your pediatrician regarding the use of this medicine in children. While this drug may be prescribed for children as young as 2 years for selected conditions, precautions do apply. Overdosage: If you think you have taken too much of this medicine contact a poison control center or emergency room at once. NOTE: This medicine is only for you. Do not share this medicine with others. What if I miss a dose? It is important not to miss your dose. Call your doctor or health care professional if you are unable to keep an appointment. What may interact with this medicine? Do not take this medicine with any of the following medications:  deferoxamine  dimercaprol  other iron products This medicine may also interact with the following medications:  chloramphenicol  deferasirox This list may not describe all possible interactions. Give your health care provider a list of all the medicines, herbs, non-prescription  drugs, or dietary supplements you use. Also tell them if you smoke, drink alcohol, or use illegal drugs. Some items may interact with your medicine. What should I watch for while using this medicine? Visit your doctor or healthcare professional regularly. Tell your doctor or healthcare professional if your symptoms do not start to get better or if they get worse. You may need blood work done while you are taking this medicine. You may need to follow a special diet. Talk to your doctor. Foods that contain iron include: whole grains/cereals, dried fruits, beans, or peas, leafy green vegetables, and organ meats (liver, kidney). What side effects may I notice from receiving this medicine? Side effects that you should report to your doctor or health care professional as soon as possible:  allergic reactions like skin rash, itching or hives, swelling of the face, lips, or tongue  breathing problems  changes in blood pressure  cough  fast, irregular heartbeat  feeling faint or lightheaded, falls  fever or chills  flushing, sweating, or hot feelings  joint or muscle aches/pains  seizures  swelling of the ankles or feet  unusually weak or tired Side effects that usually do not require medical attention (report to your doctor or health care professional if they continue or are bothersome):  diarrhea  feeling achy  headache  irritation at site where injected  nausea, vomiting  stomach upset  tiredness This list may not describe all possible side effects. Call your doctor for medical advice about side effects. You may report side effects to FDA at 1-800-FDA-1088. Where should   I keep my medicine? This drug is given in a hospital or clinic and will not be stored at home. NOTE: This sheet is a summary. It may not cover all possible information. If you have questions about this medicine, talk to your doctor, pharmacist, or health care provider.  2020 Elsevier/Gold Standard  (2011-01-19 17:14:35)  

## 2020-01-12 ENCOUNTER — Ambulatory Visit: Payer: Medicare HMO

## 2020-01-13 ENCOUNTER — Other Ambulatory Visit: Payer: Self-pay

## 2020-01-13 ENCOUNTER — Inpatient Hospital Stay: Payer: Medicare HMO

## 2020-01-13 VITALS — BP 149/86 | HR 65 | Temp 98.2°F | Resp 17

## 2020-01-13 DIAGNOSIS — Z9221 Personal history of antineoplastic chemotherapy: Secondary | ICD-10-CM | POA: Diagnosis not present

## 2020-01-13 DIAGNOSIS — Z79899 Other long term (current) drug therapy: Secondary | ICD-10-CM | POA: Diagnosis not present

## 2020-01-13 DIAGNOSIS — C182 Malignant neoplasm of ascending colon: Secondary | ICD-10-CM | POA: Diagnosis not present

## 2020-01-13 DIAGNOSIS — Z7982 Long term (current) use of aspirin: Secondary | ICD-10-CM | POA: Diagnosis not present

## 2020-01-13 DIAGNOSIS — D500 Iron deficiency anemia secondary to blood loss (chronic): Secondary | ICD-10-CM

## 2020-01-13 DIAGNOSIS — Z7901 Long term (current) use of anticoagulants: Secondary | ICD-10-CM | POA: Diagnosis not present

## 2020-01-13 DIAGNOSIS — Z86718 Personal history of other venous thrombosis and embolism: Secondary | ICD-10-CM | POA: Diagnosis not present

## 2020-01-13 DIAGNOSIS — D509 Iron deficiency anemia, unspecified: Secondary | ICD-10-CM | POA: Diagnosis not present

## 2020-01-13 MED ORDER — SODIUM CHLORIDE 0.9% FLUSH
10.0000 mL | INTRAVENOUS | Status: DC | PRN
  Administered 2020-01-13: 10 mL
  Filled 2020-01-13: qty 10

## 2020-01-13 MED ORDER — SODIUM CHLORIDE 0.9 % IV SOLN
Freq: Once | INTRAVENOUS | Status: DC | PRN
  Filled 2020-01-13: qty 250

## 2020-01-13 MED ORDER — SODIUM CHLORIDE 0.9 % IV SOLN
200.0000 mg | Freq: Once | INTRAVENOUS | Status: AC
  Administered 2020-01-13: 200 mg via INTRAVENOUS
  Filled 2020-01-13: qty 200

## 2020-01-13 MED ORDER — HEPARIN SOD (PORK) LOCK FLUSH 100 UNIT/ML IV SOLN
500.0000 [IU] | Freq: Once | INTRAVENOUS | Status: AC | PRN
  Administered 2020-01-13: 500 [IU]
  Filled 2020-01-13: qty 5

## 2020-01-13 NOTE — Patient Instructions (Signed)
Iron Sucrose injection (Venofer)  What is this medicine? IRON SUCROSE (AHY ern SOO krohs) is an iron complex. Iron is used to make healthy red blood cells, which carry oxygen and nutrients throughout the body. This medicine is used to treat iron deficiency anemia in people with chronic kidney disease. This medicine may be used for other purposes; ask your health care provider or pharmacist if you have questions. COMMON BRAND NAME(S): Venofer What should I tell my health care provider before I take this medicine? They need to know if you have any of these conditions:  anemia not caused by low iron levels  heart disease  high levels of iron in the blood  kidney disease  liver disease  an unusual or allergic reaction to iron, other medicines, foods, dyes, or preservatives  pregnant or trying to get pregnant  breast-feeding How should I use this medicine? This medicine is for infusion into a vein. It is given by a health care professional in a hospital or clinic setting. Talk to your pediatrician regarding the use of this medicine in children. While this drug may be prescribed for children as young as 2 years for selected conditions, precautions do apply. Overdosage: If you think you have taken too much of this medicine contact a poison control center or emergency room at once. NOTE: This medicine is only for you. Do not share this medicine with others. What if I miss a dose? It is important not to miss your dose. Call your doctor or health care professional if you are unable to keep an appointment. What may interact with this medicine? Do not take this medicine with any of the following medications:  deferoxamine  dimercaprol  other iron products This medicine may also interact with the following medications:  chloramphenicol  deferasirox This list may not describe all possible interactions. Give your health care provider a list of all the medicines, herbs, non-prescription  drugs, or dietary supplements you use. Also tell them if you smoke, drink alcohol, or use illegal drugs. Some items may interact with your medicine. What should I watch for while using this medicine? Visit your doctor or healthcare professional regularly. Tell your doctor or healthcare professional if your symptoms do not start to get better or if they get worse. You may need blood work done while you are taking this medicine. You may need to follow a special diet. Talk to your doctor. Foods that contain iron include: whole grains/cereals, dried fruits, beans, or peas, leafy green vegetables, and organ meats (liver, kidney). What side effects may I notice from receiving this medicine? Side effects that you should report to your doctor or health care professional as soon as possible:  allergic reactions like skin rash, itching or hives, swelling of the face, lips, or tongue  breathing problems  changes in blood pressure  cough  fast, irregular heartbeat  feeling faint or lightheaded, falls  fever or chills  flushing, sweating, or hot feelings  joint or muscle aches/pains  seizures  swelling of the ankles or feet  unusually weak or tired Side effects that usually do not require medical attention (report to your doctor or health care professional if they continue or are bothersome):  diarrhea  feeling achy  headache  irritation at site where injected  nausea, vomiting  stomach upset  tiredness This list may not describe all possible side effects. Call your doctor for medical advice about side effects. You may report side effects to FDA at 1-800-FDA-1088. Where should   I keep my medicine? This drug is given in a hospital or clinic and will not be stored at home. NOTE: This sheet is a summary. It may not cover all possible information. If you have questions about this medicine, talk to your doctor, pharmacist, or health care provider.  2020 Elsevier/Gold Standard  (2011-01-19 17:14:35)  

## 2020-01-15 ENCOUNTER — Other Ambulatory Visit: Payer: Self-pay | Admitting: Endocrinology

## 2020-01-15 DIAGNOSIS — E1149 Type 2 diabetes mellitus with other diabetic neurological complication: Secondary | ICD-10-CM

## 2020-01-15 DIAGNOSIS — Z794 Long term (current) use of insulin: Secondary | ICD-10-CM

## 2020-01-16 ENCOUNTER — Inpatient Hospital Stay: Payer: Medicare HMO

## 2020-01-16 ENCOUNTER — Other Ambulatory Visit: Payer: Self-pay

## 2020-01-16 VITALS — BP 126/88 | HR 75 | Resp 17

## 2020-01-16 DIAGNOSIS — C189 Malignant neoplasm of colon, unspecified: Secondary | ICD-10-CM | POA: Diagnosis not present

## 2020-01-16 DIAGNOSIS — Z7982 Long term (current) use of aspirin: Secondary | ICD-10-CM | POA: Diagnosis not present

## 2020-01-16 DIAGNOSIS — Z79899 Other long term (current) drug therapy: Secondary | ICD-10-CM | POA: Diagnosis not present

## 2020-01-16 DIAGNOSIS — I10 Essential (primary) hypertension: Secondary | ICD-10-CM | POA: Diagnosis not present

## 2020-01-16 DIAGNOSIS — Z86718 Personal history of other venous thrombosis and embolism: Secondary | ICD-10-CM | POA: Diagnosis not present

## 2020-01-16 DIAGNOSIS — Z7901 Long term (current) use of anticoagulants: Secondary | ICD-10-CM | POA: Diagnosis not present

## 2020-01-16 DIAGNOSIS — C182 Malignant neoplasm of ascending colon: Secondary | ICD-10-CM | POA: Diagnosis not present

## 2020-01-16 DIAGNOSIS — E1169 Type 2 diabetes mellitus with other specified complication: Secondary | ICD-10-CM | POA: Diagnosis not present

## 2020-01-16 DIAGNOSIS — Z9221 Personal history of antineoplastic chemotherapy: Secondary | ICD-10-CM | POA: Diagnosis not present

## 2020-01-16 DIAGNOSIS — D500 Iron deficiency anemia secondary to blood loss (chronic): Secondary | ICD-10-CM | POA: Diagnosis not present

## 2020-01-16 DIAGNOSIS — D630 Anemia in neoplastic disease: Secondary | ICD-10-CM | POA: Diagnosis not present

## 2020-01-16 DIAGNOSIS — E785 Hyperlipidemia, unspecified: Secondary | ICD-10-CM | POA: Diagnosis not present

## 2020-01-16 DIAGNOSIS — D509 Iron deficiency anemia, unspecified: Secondary | ICD-10-CM | POA: Diagnosis not present

## 2020-01-16 MED ORDER — SODIUM CHLORIDE 0.9 % IV SOLN
Freq: Once | INTRAVENOUS | Status: AC
  Filled 2020-01-16: qty 250

## 2020-01-16 MED ORDER — SODIUM CHLORIDE 0.9% FLUSH
10.0000 mL | INTRAVENOUS | Status: DC | PRN
  Administered 2020-01-16: 10 mL
  Filled 2020-01-16: qty 10

## 2020-01-16 MED ORDER — HEPARIN SOD (PORK) LOCK FLUSH 100 UNIT/ML IV SOLN
500.0000 [IU] | Freq: Once | INTRAVENOUS | Status: AC | PRN
  Administered 2020-01-16: 500 [IU]
  Filled 2020-01-16: qty 5

## 2020-01-16 MED ORDER — SODIUM CHLORIDE 0.9 % IV SOLN
200.0000 mg | Freq: Once | INTRAVENOUS | Status: AC
  Administered 2020-01-16: 200 mg via INTRAVENOUS
  Filled 2020-01-16: qty 200

## 2020-01-16 NOTE — Patient Instructions (Signed)
Iron Sucrose injection (Venofer)  What is this medicine? IRON SUCROSE (AHY ern SOO krohs) is an iron complex. Iron is used to make healthy red blood cells, which carry oxygen and nutrients throughout the body. This medicine is used to treat iron deficiency anemia in people with chronic kidney disease. This medicine may be used for other purposes; ask your health care provider or pharmacist if you have questions. COMMON BRAND NAME(S): Venofer What should I tell my health care provider before I take this medicine? They need to know if you have any of these conditions:  anemia not caused by low iron levels  heart disease  high levels of iron in the blood  kidney disease  liver disease  an unusual or allergic reaction to iron, other medicines, foods, dyes, or preservatives  pregnant or trying to get pregnant  breast-feeding How should I use this medicine? This medicine is for infusion into a vein. It is given by a health care professional in a hospital or clinic setting. Talk to your pediatrician regarding the use of this medicine in children. While this drug may be prescribed for children as young as 2 years for selected conditions, precautions do apply. Overdosage: If you think you have taken too much of this medicine contact a poison control center or emergency room at once. NOTE: This medicine is only for you. Do not share this medicine with others. What if I miss a dose? It is important not to miss your dose. Call your doctor or health care professional if you are unable to keep an appointment. What may interact with this medicine? Do not take this medicine with any of the following medications:  deferoxamine  dimercaprol  other iron products This medicine may also interact with the following medications:  chloramphenicol  deferasirox This list may not describe all possible interactions. Give your health care provider a list of all the medicines, herbs, non-prescription  drugs, or dietary supplements you use. Also tell them if you smoke, drink alcohol, or use illegal drugs. Some items may interact with your medicine. What should I watch for while using this medicine? Visit your doctor or healthcare professional regularly. Tell your doctor or healthcare professional if your symptoms do not start to get better or if they get worse. You may need blood work done while you are taking this medicine. You may need to follow a special diet. Talk to your doctor. Foods that contain iron include: whole grains/cereals, dried fruits, beans, or peas, leafy green vegetables, and organ meats (liver, kidney). What side effects may I notice from receiving this medicine? Side effects that you should report to your doctor or health care professional as soon as possible:  allergic reactions like skin rash, itching or hives, swelling of the face, lips, or tongue  breathing problems  changes in blood pressure  cough  fast, irregular heartbeat  feeling faint or lightheaded, falls  fever or chills  flushing, sweating, or hot feelings  joint or muscle aches/pains  seizures  swelling of the ankles or feet  unusually weak or tired Side effects that usually do not require medical attention (report to your doctor or health care professional if they continue or are bothersome):  diarrhea  feeling achy  headache  irritation at site where injected  nausea, vomiting  stomach upset  tiredness This list may not describe all possible side effects. Call your doctor for medical advice about side effects. You may report side effects to FDA at 1-800-FDA-1088. Where should   I keep my medicine? This drug is given in a hospital or clinic and will not be stored at home. NOTE: This sheet is a summary. It may not cover all possible information. If you have questions about this medicine, talk to your doctor, pharmacist, or health care provider.  2020 Elsevier/Gold Standard  (2011-01-19 17:14:35)  

## 2020-01-21 ENCOUNTER — Other Ambulatory Visit: Payer: Medicare HMO

## 2020-01-21 ENCOUNTER — Ambulatory Visit: Payer: Medicare HMO | Admitting: Hematology & Oncology

## 2020-01-21 ENCOUNTER — Ambulatory Visit: Payer: Medicare HMO

## 2020-01-22 DIAGNOSIS — Z7901 Long term (current) use of anticoagulants: Secondary | ICD-10-CM | POA: Diagnosis not present

## 2020-01-22 DIAGNOSIS — Z8673 Personal history of transient ischemic attack (TIA), and cerebral infarction without residual deficits: Secondary | ICD-10-CM | POA: Diagnosis not present

## 2020-01-22 DIAGNOSIS — C182 Malignant neoplasm of ascending colon: Secondary | ICD-10-CM | POA: Diagnosis not present

## 2020-01-22 DIAGNOSIS — Z86718 Personal history of other venous thrombosis and embolism: Secondary | ICD-10-CM | POA: Diagnosis not present

## 2020-01-22 DIAGNOSIS — E119 Type 2 diabetes mellitus without complications: Secondary | ICD-10-CM | POA: Diagnosis not present

## 2020-01-28 ENCOUNTER — Inpatient Hospital Stay: Payer: Medicare HMO | Attending: Hematology & Oncology

## 2020-01-28 ENCOUNTER — Inpatient Hospital Stay: Payer: Medicare HMO

## 2020-01-28 ENCOUNTER — Encounter: Payer: Self-pay | Admitting: Hematology & Oncology

## 2020-01-28 ENCOUNTER — Inpatient Hospital Stay (HOSPITAL_BASED_OUTPATIENT_CLINIC_OR_DEPARTMENT_OTHER): Payer: Medicare HMO | Admitting: Hematology & Oncology

## 2020-01-28 ENCOUNTER — Telehealth: Payer: Self-pay

## 2020-01-28 ENCOUNTER — Other Ambulatory Visit: Payer: Self-pay

## 2020-01-28 ENCOUNTER — Telehealth: Payer: Self-pay | Admitting: Hematology & Oncology

## 2020-01-28 VITALS — BP 135/62 | HR 83 | Temp 98.2°F | Resp 18

## 2020-01-28 DIAGNOSIS — Z7982 Long term (current) use of aspirin: Secondary | ICD-10-CM | POA: Diagnosis not present

## 2020-01-28 DIAGNOSIS — Z9221 Personal history of antineoplastic chemotherapy: Secondary | ICD-10-CM | POA: Diagnosis not present

## 2020-01-28 DIAGNOSIS — Z86711 Personal history of pulmonary embolism: Secondary | ICD-10-CM | POA: Diagnosis not present

## 2020-01-28 DIAGNOSIS — Z79899 Other long term (current) drug therapy: Secondary | ICD-10-CM | POA: Diagnosis not present

## 2020-01-28 DIAGNOSIS — C787 Secondary malignant neoplasm of liver and intrahepatic bile duct: Secondary | ICD-10-CM | POA: Diagnosis not present

## 2020-01-28 DIAGNOSIS — C182 Malignant neoplasm of ascending colon: Secondary | ICD-10-CM | POA: Diagnosis not present

## 2020-01-28 DIAGNOSIS — C189 Malignant neoplasm of colon, unspecified: Secondary | ICD-10-CM

## 2020-01-28 DIAGNOSIS — R600 Localized edema: Secondary | ICD-10-CM | POA: Diagnosis not present

## 2020-01-28 DIAGNOSIS — Z7901 Long term (current) use of anticoagulants: Secondary | ICD-10-CM | POA: Insufficient documentation

## 2020-01-28 DIAGNOSIS — D509 Iron deficiency anemia, unspecified: Secondary | ICD-10-CM | POA: Insufficient documentation

## 2020-01-28 LAB — IRON AND TIBC
Iron: 36 ug/dL — ABNORMAL LOW (ref 41–142)
Saturation Ratios: 16 % — ABNORMAL LOW (ref 21–57)
TIBC: 226 ug/dL — ABNORMAL LOW (ref 236–444)
UIBC: 190 ug/dL (ref 120–384)

## 2020-01-28 LAB — CBC WITH DIFFERENTIAL (CANCER CENTER ONLY)
Abs Immature Granulocytes: 0.03 10*3/uL (ref 0.00–0.07)
Basophils Absolute: 0 10*3/uL (ref 0.0–0.1)
Basophils Relative: 0 %
Eosinophils Absolute: 0.1 10*3/uL (ref 0.0–0.5)
Eosinophils Relative: 1 %
HCT: 28.5 % — ABNORMAL LOW (ref 36.0–46.0)
Hemoglobin: 8.6 g/dL — ABNORMAL LOW (ref 12.0–15.0)
Immature Granulocytes: 0 %
Lymphocytes Relative: 16 %
Lymphs Abs: 1.2 10*3/uL (ref 0.7–4.0)
MCH: 27.9 pg (ref 26.0–34.0)
MCHC: 30.2 g/dL (ref 30.0–36.0)
MCV: 92.5 fL (ref 80.0–100.0)
Monocytes Absolute: 0.7 10*3/uL (ref 0.1–1.0)
Monocytes Relative: 9 %
Neutro Abs: 5.5 10*3/uL (ref 1.7–7.7)
Neutrophils Relative %: 74 %
Platelet Count: 222 10*3/uL (ref 150–400)
RBC: 3.08 MIL/uL — ABNORMAL LOW (ref 3.87–5.11)
RDW: 15.5 % (ref 11.5–15.5)
WBC Count: 7.5 10*3/uL (ref 4.0–10.5)
nRBC: 0 % (ref 0.0–0.2)

## 2020-01-28 LAB — CEA (IN HOUSE-CHCC): CEA (CHCC-In House): 8.08 ng/mL — ABNORMAL HIGH (ref 0.00–5.00)

## 2020-01-28 LAB — CMP (CANCER CENTER ONLY)
ALT: 10 U/L (ref 0–44)
AST: 16 U/L (ref 15–41)
Albumin: 3.6 g/dL (ref 3.5–5.0)
Alkaline Phosphatase: 58 U/L (ref 38–126)
Anion gap: 7 (ref 5–15)
BUN: 14 mg/dL (ref 8–23)
CO2: 28 mmol/L (ref 22–32)
Calcium: 9.6 mg/dL (ref 8.9–10.3)
Chloride: 107 mmol/L (ref 98–111)
Creatinine: 0.77 mg/dL (ref 0.44–1.00)
GFR, Estimated: >60 mL/min (ref >60)
Glucose, Bld: 148 mg/dL — ABNORMAL HIGH (ref 70–99)
Potassium: 4 mmol/L (ref 3.5–5.1)
Sodium: 142 mmol/L (ref 135–145)
Total Bilirubin: 0.4 mg/dL (ref 0.3–1.2)
Total Protein: 5.5 g/dL — ABNORMAL LOW (ref 6.5–8.1)

## 2020-01-28 LAB — FERRITIN: Ferritin: 580 ng/mL — ABNORMAL HIGH (ref 11–307)

## 2020-01-28 MED ORDER — SODIUM CHLORIDE 0.9% FLUSH
10.0000 mL | INTRAVENOUS | Status: DC | PRN
  Administered 2020-01-28: 10 mL via INTRAVENOUS
  Filled 2020-01-28: qty 10

## 2020-01-28 MED ORDER — HEPARIN SOD (PORK) LOCK FLUSH 100 UNIT/ML IV SOLN
500.0000 [IU] | Freq: Once | INTRAVENOUS | Status: AC
  Administered 2020-01-28: 500 [IU] via INTRAVENOUS
  Filled 2020-01-28: qty 5

## 2020-01-28 NOTE — Telephone Encounter (Signed)
LM regarding lab results

## 2020-01-28 NOTE — Progress Notes (Signed)
Hematology and Oncology Follow Up Visit  Tonya [name] [id number] October 12, 1940 79 y.o. 01/28/2020   Principle Diagnosis:  Stage IV adenocarcinoma of the ascending colon -- NRAS+, BRAF-, HER2-, MSI/MMR -,  Iron deficiency anemia DVT in LEFT leg-- Peroneal and popliteal vein  Past Therapy: FOLFOX - s/p cycle5  Current Therapy: FOLFIRI/Bevacizumab -started 01/20/2019,s/p cycle #16 -- Avastin d/c'ed on 10/21/2019 due to DVT IV Iron as indicated Eliquis 5 mg po BID -- started on 09/30/2019   Interim History:  Tonya [name] is here today with her daughter for follow-up.  She saw Dr. Lucia Gaskins.  This was to see about having the cecal mass resected.  She has had aggressive chemotherapy.  I thought that if we could resect out the cecal mass, this would help minimize any complications in the future.  Dr. Lucia Gaskins would like to have cardiology to see about clearing her.  He is worried about her comorbidities.  He feels other would be a significant risk of postoperative complications.  I definitely understand this.  I know this is a very difficult situation.  She has a CVA.  She has a blood clot in the left leg.  She is on blood thinner.  He still is not ruled out surgery.  If Tonya [name] does not undergo surgery, then I would favor radiation therapy with Xeloda as a radiosensitizer for her.  I realize that she is elderly.  However, she really is tolerated chemotherapy fairly nicely.  She has had some problems with iron deficiency.  Her hemoglobin is lower which surprises me.  I did go ahead and give her 3 guaiac cards to take.  I do worry that she might be having some GI bleeding.  Possibly, she could be bleeding from this cecal mass.  She probably will need iron.  Her iron studies today show a ferritin of 580 with an iron saturation of 16%.  Her CEA is creeping back up.  Her CEA today was 8.1.  Previously, it was 6.1.  Her appetite is okay.  She has had no nausea or vomiting.  Overall,  her performance status is ECOG 1.    Medications:  Allergies as of 01/28/2020      Reactions   Hydrocodone Swelling, Other (See Comments)   "I started swelling, became red, and passed out"   Iohexol Hives, Shortness Of Breath, Other (See Comments)   Patient developed hives and fullness in throat post injection of 125cc's Omni 300, Onset Date: 11/15/2006   Naproxen Other (See Comments)   "It made me feel out of my head"   Ibuprofen Nausea Only   Propoxyphene N-acetaminophen Other (See Comments)   "I couldn't find the door to make my way out of the room- I was in misery"      Medication List       Accurate as of January 28, 2020  9:38 AM. If you have any questions, ask your nurse or doctor.        aspirin EC 81 MG tablet Take 81 mg by mouth daily.   atorvastatin 10 MG tablet Commonly known as: LIPITOR Take 1 tablet (10 mg total) by mouth daily.   B-D SINGLE USE SWABS REGULAR Pads 1 each by Does not apply route as needed (to cleanse site prior to obtaining droplet of blood for CBG's). E11.9   B-D ULTRAFINE III SHORT PEN 31G X 8 MM Misc Generic drug: Insulin Pen Needle   Cholecalciferol 50 MCG (2000 UT) Tabs Take 1 tablet (2,000 Units  total) by mouth daily.   dexamethasone 4 MG tablet Commonly known as: DECADRON Take 2 tablets (8 mg total) by mouth 2 (two) times daily. For four days following chemotherapy.   Eliquis 5 MG Tabs tablet Generic drug: apixaban TAKE 1 TABLET(5 MG) BY MOUTH TWICE DAILY   famotidine 40 MG tablet Commonly known as: Pepcid Take 1 tablet (40 mg total) by mouth 2 (two) times daily.   lidocaine-prilocaine cream Commonly known as: EMLA   loperamide 2 MG tablet Commonly known as: Imodium A-D Take 2 tablets (4 mg total) by mouth 4 (four) times daily as needed. Take 2 at diarrhea onset , then 1 every 2hr until 12hrs with no BM. May take 2 every 4hrs at night. If diarrhea recurs repeat.   losartan 50 MG tablet Commonly known as: COZAAR Take 50 mg  by mouth daily.   magic mouthwash Soln Swish and swallow equal parts Benadryl, Lidocaine and Nystatin.  5- 10 ml.'s four times a day as needed.   meclizine 12.5 MG tablet Commonly known as: ANTIVERT TAKE 1 TABLET(12.5 MG) BY MOUTH THREE TIMES DAILY AS NEEDED FOR DIZZINESS   metoprolol succinate 25 MG 24 hr tablet Commonly known as: TOPROL-XL   metoprolol tartrate 25 MG tablet Commonly known as: LOPRESSOR Take 1 tablet (25 mg total) by mouth 2 (two) times daily.   olmesartan 20 MG tablet Commonly known as: BENICAR Take 1 tablet (20 mg total) by mouth daily.   traMADol 50 MG tablet Commonly known as: ULTRAM Take 1 tablet (50 mg total) by mouth every 6 (six) hours as needed.   Tyler Aas FlexTouch 100 UNIT/ML FlexTouch Pen Generic drug: insulin degludec Inject 0.21 mLs (21 Units total) into the skin daily. Take 34 units on chemo days   True Metrix Blood Glucose Test test strip Generic drug: glucose blood TEST BLOOD SUGAR TWICE DAILY   TRUEplus Lancets 30G Misc 1 each by Does not apply route 2 (two) times daily. E11.9       Allergies:  Allergies  Allergen Reactions  . Hydrocodone Swelling and Other (See Comments)    "I started swelling, became red, and passed out"  . Iohexol Hives, Shortness Of Breath and Other (See Comments)    Patient developed hives and fullness in throat post injection of 125cc's Omni 300, Onset Date: 11/15/2006   . Naproxen Other (See Comments)    "It made me feel out of my head"  . Ibuprofen Nausea Only  . Propoxyphene N-Acetaminophen Other (See Comments)    "I couldn't find the door to make my way out of the room- I was in misery"    Past Medical History, Surgical history, Social history, and Family History were reviewed and updated.  Review of Systems: Review of Systems  Constitutional: Negative.   HENT: Negative.   Eyes: Negative.   Respiratory: Negative.   Cardiovascular: Negative.   Gastrointestinal: Negative.   Genitourinary:  Negative.   Musculoskeletal: Negative.   Skin: Negative.   Neurological: Negative.   Endo/Heme/Allergies: Negative.   Psychiatric/Behavioral: Negative.      Physical Exam:  oral temperature is 98.2 F (36.8 C). Her blood pressure is 135/62 and her pulse is 83. Her respiration is 18 and oxygen saturation is 99%.   Wt Readings from Last 3 Encounters:  12/15/19 184 lb (83.5 kg)  12/10/19 183 lb 4 oz (83.1 kg)  11/19/19 181 lb (82.1 kg)    Physical Exam Vitals reviewed.  HENT:     Head: Normocephalic and atraumatic.  Eyes:     Pupils: Pupils are equal, round, and reactive to light.  Cardiovascular:     Rate and Rhythm: Normal rate and regular rhythm.     Heart sounds: Normal heart sounds.  Pulmonary:     Effort: Pulmonary effort is normal.     Breath sounds: Normal breath sounds.  Abdominal:     General: Bowel sounds are normal.     Palpations: Abdomen is soft.  Musculoskeletal:        General: No tenderness or deformity. Normal range of motion.     Cervical back: Normal range of motion.  Lymphadenopathy:     Cervical: No cervical adenopathy.  Skin:    General: Skin is warm and dry.     Findings: No erythema or rash.  Neurological:     Mental Status: She is alert and oriented to person, place, and time.     Comments: She has some chronic weakness over on the left side from past stroke.  Psychiatric:        Behavior: Behavior normal.        Thought Content: Thought content normal.        Judgment: Judgment normal.      Lab Results  Component Value Date   WBC 7.5 01/28/2020   HGB 8.6 (L) 01/28/2020   HCT 28.5 (L) 01/28/2020   MCV 92.5 01/28/2020   PLT 222 01/28/2020   Lab Results  Component Value Date   FERRITIN 276 12/31/2019   IRON 29 (L) 12/31/2019   TIBC 266 12/31/2019   UIBC 237 12/31/2019   IRONPCTSAT 11 (L) 12/31/2019   Lab Results  Component Value Date   RETICCTPCT 1.5 05/17/2016   RBC 3.08 (L) 01/28/2020   RETICCTABS 62,400 05/17/2016   No  results found for: KPAFRELGTCHN, LAMBDASER, KAPLAMBRATIO No results found for: IGGSERUM, IGA, IGMSERUM No results found for: Odetta Pink, SPEI   Chemistry      Component Value Date/Time   NA 142 01/28/2020 0853   NA 139 09/30/2018 1311   K 4.0 01/28/2020 0853   CL 107 01/28/2020 0853   CO2 28 01/28/2020 0853   BUN 14 01/28/2020 0853   BUN 10 09/30/2018 1311   CREATININE 0.77 01/28/2020 0853      Component Value Date/Time   CALCIUM 9.6 01/28/2020 0853   ALKPHOS 58 01/28/2020 0853   AST 16 01/28/2020 0853   ALT 10 01/28/2020 0853   BILITOT 0.4 01/28/2020 0853       Impression and Plan: Tonya [name] is a very pleasant 79 yo African American female with metastatic colon cancer.  Again, she does have progressive disease.  She has done quite well with the FOLFIRI regimen.  As such, I do think that if she is able to have surgery, she would be a good candidate for surgical resection of the primary.  We will have to see what cardiology says.  Again I know this is a tricky situation.  I know that there is certainly a risk with Tonya [name].  However, she is done nicely with chemotherapy.  She has not been hospitalized from complications from chemotherapy.  She does have a lot of support at home.  I know this will help her out.  I do appreciate all the input from Dr. Lucia Gaskins and cardiology.  I realize that this is still a very "gray area."  We will have to see what happens with respect to cardiology.  If they give  this more the "go ahead" then maybe Dr. Lucia Gaskins will move ahead with surgery.  If not, then we will get radiation oncology involved.   [name], MD 10/6/20219:38 AM

## 2020-01-28 NOTE — Patient Instructions (Signed)

## 2020-01-28 NOTE — Telephone Encounter (Signed)
Appointments scheduled calendar printed per 10/6 los

## 2020-01-28 NOTE — Telephone Encounter (Signed)
-----   Message from [name], MD sent at 01/28/2020  3:25 PM EDT ----- Call - the iron is low!! She needs 2 doses of Feraheme.  Laurey Arrow

## 2020-01-29 ENCOUNTER — Ambulatory Visit: Payer: Medicare HMO | Admitting: Cardiology

## 2020-01-29 ENCOUNTER — Ambulatory Visit (HOSPITAL_BASED_OUTPATIENT_CLINIC_OR_DEPARTMENT_OTHER)
Admission: RE | Admit: 2020-01-29 | Discharge: 2020-01-29 | Disposition: A | Discharge status: Home / Self Care | Payer: Medicare HMO | Source: Ambulatory Visit | Attending: Hematology & Oncology | Admitting: Hematology & Oncology

## 2020-01-29 ENCOUNTER — Encounter: Payer: Self-pay | Admitting: Cardiology

## 2020-01-29 ENCOUNTER — Telehealth: Payer: Self-pay | Admitting: *Deleted

## 2020-01-29 ENCOUNTER — Telehealth: Payer: Self-pay | Admitting: Hematology & Oncology

## 2020-01-29 VITALS — BP 130/64 | HR 86 | Resp 14 | Ht 71.0 in | Wt 187.8 lb

## 2020-01-29 DIAGNOSIS — I82402 Acute embolism and thrombosis of unspecified deep veins of left lower extremity: Secondary | ICD-10-CM | POA: Diagnosis not present

## 2020-01-29 DIAGNOSIS — I493 Ventricular premature depolarization: Secondary | ICD-10-CM

## 2020-01-29 DIAGNOSIS — Z8673 Personal history of transient ischemic attack (TIA), and cerebral infarction without residual deficits: Secondary | ICD-10-CM

## 2020-01-29 DIAGNOSIS — R600 Localized edema: Secondary | ICD-10-CM

## 2020-01-29 DIAGNOSIS — E7800 Pure hypercholesterolemia, unspecified: Secondary | ICD-10-CM

## 2020-01-29 DIAGNOSIS — I82432 Acute embolism and thrombosis of left popliteal vein: Secondary | ICD-10-CM | POA: Diagnosis not present

## 2020-01-29 DIAGNOSIS — I10 Essential (primary) hypertension: Secondary | ICD-10-CM

## 2020-01-29 NOTE — Progress Notes (Signed)
Primary Physician/Referring:  Leeroy Cha, MD  Patient ID: Tonya [name], female    DOB: 04-Jul-1940, 79 y.o.   MRN: [id number]  Chief Complaint  Patient presents with  . Frequent PVC's  . Follow-up    6 weeks    HPI: Tonya [name]  is a 79 y.o. female    with Hypertension, hyperlipidemia, uncontrolled diabetes mellitus, history of stroke involving the right pons in 2001 with moderate diffuse atherosclerotic changes in the intracerebral vessels when she presented with left-sided weakness and slurred speech, lumbosacral spondylosi, nonsmoker, h/o GI bleed on 10/18/2018 and anemia due to descending colonic cancer with liver metastasis and presently on chemotherapy and h/o DVT in July 2021 and now on Eliquis presents for follow-up of hypertension, dyspnea on exertion and abnormal EKG with frequent PVCs.   Patient has been scheduled for right colectomy due to colon cancer.  She is being evaluated further by Dr. Alphonsa Overall.  She has no specific complaints from cardiac standpoint, still continues to have occasional palpitations and occasional episodes of dizziness but has not had any chest pain or syncope or near syncope.  Leg edema is chronic left worse than the right.  No change and states that it is improved and she is wearing support stockings/support hose.   Past Medical History:  Diagnosis Date  . Cerebrovascular accident ([place])   . Chronic edema    ? venous insufficiency  . COPD (chronic obstructive pulmonary disease) ([place])    never had a problem breathing  . Diabetes mellitus without complication ([place])   . Enlarged heart    per pt  . Gallstones   . GERD (gastroesophageal reflux disease)   . Goals of care, counseling/discussion 11/01/2018  . Hyperlipidemia   . Hypertension     Past Surgical History:  Procedure Laterality Date  . ABDOMINAL HYSTERECTOMY    . BIOPSY  10/18/2018   Procedure: BIOPSY;  Surgeon: Jackquline Denmark, MD;  Location: [facility] ENDOSCOPY;  Service:  Endoscopy;;  . CHOLECYSTECTOMY    . COLONOSCOPY WITH PROPOFOL N/A 10/18/2018   Procedure: COLONOSCOPY WITH PROPOFOL;  Surgeon: Jackquline Denmark, MD;  Location: [facility] ENDOSCOPY;  Service: Endoscopy;  Laterality: N/A;  . IR IMAGING GUIDED PORT INSERTION  11/11/2018  . POLYPECTOMY  10/18/2018   Procedure: POLYPECTOMY;  Surgeon: Jackquline Denmark, MD;  Location: [facility] ENDOSCOPY;  Service: Endoscopy;;  . SUBMUCOSAL TATTOO INJECTION  10/18/2018   Procedure: SUBMUCOSAL TATTOO INJECTION;  Surgeon: Jackquline Denmark, MD;  Location: [facility] ENDOSCOPY;  Service: Endoscopy;;   Social History   Tobacco Use  . Smoking status: Former Smoker    Packs/day: 0.25    Years: 1.00    Pack years: 0.25    Types: Cigarettes    Quit date: 07/23/1968    Years since quitting: 51.5  . Smokeless tobacco: Never Used  Substance Use Topics  . Alcohol use: No    Alcohol/week: 0.0 standard drinks   Marital Status: Widowed   Review of Systems  Cardiovascular: Positive for leg swelling and palpitations. Negative for chest pain and dyspnea on exertion.  Musculoskeletal: Positive for arthritis, back pain and joint pain.  Gastrointestinal: Negative for melena.  Neurological: Positive for focal weakness (left hemiparesis).   Objective  Blood pressure 130/64, pulse 86, resp. rate 14, height 5\' 11"  (1.803 m), weight 187 lb 12.8 oz (85.2 kg), SpO2 94 %. Body mass index is 26.19 kg/m.  Vitals with BMI 01/29/2020 01/29/2020 01/29/2020  Height - - 5\' 11"   Weight - - 187 lbs  13 oz  BMI - - 17.7  Systolic 939 030 092  Diastolic 64 70 68  Pulse - - 86    Physical Exam Constitutional:      Comments: Moderately built  Neck:     Thyroid: No thyromegaly.     Vascular: No JVD.  Cardiovascular:     Rate and Rhythm: Normal rate and regular rhythm. Occasional extrasystoles are present.    Pulses: Intact distal pulses.     Heart sounds: Murmur heard.  Blowing midsystolic murmur is present with a grade of 3/6 at the apex.  No gallop.      Comments:  2-3+ left leg edema, 2+ right leg edema, pitting.  No JVD. Pulmonary:     Effort: Pulmonary effort is normal.     Breath sounds: Normal breath sounds.  Abdominal:     General: Bowel sounds are normal.     Palpations: Abdomen is soft.  Neurological:     Comments: Grossly left arm weakness and mild contracture noted, left lower extremity weakness present. (left hemiparesis).    Radiology: No results found.  Laboratory examination:    CMP Latest Ref Rng & Units 01/28/2020 12/31/2019 12/10/2019  Glucose 70 - 99 mg/dL 148(H) 144(H) 165(H)  BUN 8 - 23 mg/dL 14 11 11   Creatinine 0.44 - 1.00 mg/dL 0.77 0.74 0.76  Sodium 135 - 145 mmol/L 142 143 142  Potassium 3.5 - 5.1 mmol/L 4.0 3.8 3.8  Chloride 98 - 111 mmol/L 107 108 108  CO2 22 - 32 mmol/L 28 28 29   Calcium 8.9 - 10.3 mg/dL 9.6 9.5 9.7  Total Protein 6.5 - 8.1 g/dL 5.5(L) 5.4(L) 5.6(L)  Total Bilirubin 0.3 - 1.2 mg/dL 0.4 0.6 0.6  Alkaline Phos 38 - 126 U/L 58 59 68  AST 15 - 41 U/L 16 15 15   ALT 0 - 44 U/L 10 12 14    CBC Latest Ref Rng & Units 01/28/2020 12/31/2019 12/10/2019  WBC 4.0 - 10.5 K/uL 7.5 5.9 5.7  Hemoglobin 12.0 - 15.0 g/dL 8.6(L) 9.7(L) 10.1(L)  Hematocrit 36 - 46 % 28.5(L) 32.3(L) 32.9(L)  Platelets 150 - 400 K/uL 222 242 192   Lipid Panel     Component Value Date/Time   CHOL 137 03/30/2018 1201   TRIG 80 03/30/2018 1201   HDL 49 03/30/2018 1201   CHOLHDL 2.8 03/30/2018 1201   CHOLHDL 3 07/17/2017 1227   VLDL 21.6 07/17/2017 1227   LDLCALC 72 03/30/2018 1201   HEMOGLOBIN A1C Lab Results  Component Value Date   HGBA1C 7.6 (A) 09/19/2019   TSH No results for input(s): TSH in the last 8760 hours.  Current Outpatient Medications on File Prior to Visit  Medication Sig Dispense Refill  . Alcohol Swabs (B-D SINGLE USE SWABS REGULAR) PADS 1 each by Does not apply route as needed (to cleanse site prior to obtaining droplet of blood for CBG's). E11.9 100 each 2  . aspirin EC 81 MG tablet Take 81 mg by mouth  daily.    Marland Kitchen atorvastatin (LIPITOR) 10 MG tablet Take 1 tablet (10 mg total) by mouth daily. 90 tablet 1  . B-D ULTRAFINE III SHORT PEN 31G X 8 MM MISC     . Cholecalciferol 2000 units TABS Take 1 tablet (2,000 Units total) by mouth daily. 90 tablet 1  . dexamethasone (DECADRON) 4 MG tablet Take 2 tablets (8 mg total) by mouth 2 (two) times daily. For four days following chemotherapy. 60 tablet 2  . ELIQUIS 5 MG  TABS tablet TAKE 1 TABLET(5 MG) BY MOUTH TWICE DAILY 60 tablet 0  . famotidine (PEPCID) 40 MG tablet Take 1 tablet (40 mg total) by mouth 2 (two) times daily. 180 tablet 3  . insulin degludec (TRESIBA FLEXTOUCH) 100 UNIT/ML FlexTouch Pen Inject 0.21 mLs (21 Units total) into the skin daily. Take 34 units on chemo days 10 pen 11  . lidocaine-prilocaine (EMLA) cream     . loperamide (IMODIUM A-D) 2 MG tablet Take 2 tablets (4 mg total) by mouth 4 (four) times daily as needed. Take 2 at diarrhea onset , then 1 every 2hr until 12hrs with no BM. May take 2 every 4hrs at night. If diarrhea recurs repeat. 100 tablet 1  . losartan (COZAAR) 50 MG tablet Take 50 mg by mouth daily.     . magic mouthwash SOLN Swish and swallow equal parts Benadryl, Lidocaine and Nystatin.  5- 10 ml.'s four times a day as needed. 240 mL 0  . meclizine (ANTIVERT) 12.5 MG tablet TAKE 1 TABLET(12.5 MG) BY MOUTH THREE TIMES DAILY AS NEEDED FOR DIZZINESS 30 tablet 2  . metoprolol tartrate (LOPRESSOR) 25 MG tablet Take 1 tablet (25 mg total) by mouth 2 (two) times daily. 180 tablet 0  . olmesartan (BENICAR) 20 MG tablet Take 1 tablet (20 mg total) by mouth daily. 90 tablet 1  . traMADol (ULTRAM) 50 MG tablet Take 1 tablet (50 mg total) by mouth every 6 (six) hours as needed. 30 tablet 0  . TRUE METRIX BLOOD GLUCOSE TEST test strip TEST BLOOD SUGAR TWICE DAILY 200 strip 1  . TRUEplus Lancets 30G MISC 1 each by Does not apply route 2 (two) times daily. E11.9 180 each 0  . [DISCONTINUED] prochlorperazine (COMPAZINE) 10 MG tablet  Take 1 tablet (10 mg total) by mouth every 6 (six) hours as needed (Nausea or vomiting). 30 tablet 1   No current facility-administered medications on file prior to visit.    Cardiac Studies:   Holter Monitor for 24 hours  10/09/2016:  Normal sinus rhythm, sinus bradycardia and sinus tachycardia with heart rate ranging from 45-121bpm with average heart rate 69bpm.  Frequent PVCs, bigeminal PVCs, salvos and nonsustained ventricular tachycardia up to 5 beats in a ros  PVC load was 20%.  Occasional PACs and nonsustained atrial tachycardia up to 7 beats in a row.  Event monitor 30 days 07/30/2018: Predominant rhythm is normal sinus rhythm.  There were 3 runs of NSVT, maximum 13 beats, minimum 3 beats, asymptomatic.  Frequent PVCs, ventricular ectopic burden 5%.  There are occasional PACs.  Echocardiogram 08/28/2018: Left ventricle cavity is normal in size. Moderate concentric hypertrophy of the left ventricle. Normal global wall motion. Doppler evidence of grade II (pseudonormal) diastolic dysfunction, elevated LAP. Calculated EF 53%. Mild (Grade I) mitral regurgitation. Mild tricuspid regurgitation. Estimated pulmonary artery systolic pressure 26 mmHg. Mild pulmonic regurgitation. Compared to 10/09/16, previously mild LVH.   Lexiscan Myoview stress test 10/08/2018: Lexiscan stress test was performed. Stress EKG is non-diagnostic, as this is pharmacological stress test. Myocardial pefusion imaging is normal. LVEF 65%. Low risk study.  No significant change from  10/09/2016.  Lower Extremity Venous Duplex  10/01/2019: Nonocclusive deep venous thrombus identified within the LEFT popliteal and LEFT peroneal veins. No evidence of deep venous thrombosis in the RIGHT lower extremity.   Lower Extremity Venous Duplex left leg  11/12/2019: Redemonstration of nonocclusive left popliteal vein DVT. No evidence of extension, and no evidence of proximal DVT.  Tibial veins appear  patent with no  DVT.  EKG:   EKG 05/31/2018: Sinus rhythm with first-degree AV block at rate of 56 bpm, left axis deviation, left anterior fascicular block.  Cannot exclude inferior infarct old, anteroseptal infarct old.  No evidence of ischemia.  EKG 09/19/2016: Normal sinus rhythm at the rate of 85 bpm, left axis deviation, poor R-wave progression, frequent PVCs in the form of ventricular bigeminy, ventricular couplets and triplets.  Assessment     ICD-10-CM   1. Essential hypertension  I10   2. Frequent PVCs  I49.3   3. H/O ischemic right PCA stroke - right pons 2001: Residual left hemiparesis  Z86.73   4. Hypercholesteremia  E78.00       No orders of the defined types were placed in this encounter.  Medications Discontinued During This Encounter  Medication Reason  . metoprolol succinate (TOPROL-XL) 25 MG 24 hr tablet Duplicate      Recommendations:   Tonya [name]  is a 79 y.o. female  with Hypertension, hyperlipidemia, uncontrolled diabetes mellitus, history of stroke involving the right pons in 2001 with moderate diffuse atherosclerotic changes in the intracerebral vessels when she presented with left-sided weakness and slurred speech, lumbosacral spondylosi, nonsmoker, h/o GI bleed on 10/18/2018 and anemia due to descending colonic cancer with liver metastasis and presently on chemotherapy and h/o DVT in July 2021 and now on Eliquis presents for follow-up of hypertension, dyspnea on exertion and abnormal EKG with frequent PVCs.   Patient has been scheduled for right colectomy due to colon cancer.  She is being evaluated further by Dr. Alphonsa Overall.  From cardiac standpoint, she has normal LVEF, low risk nuclear stress test in 2020, do not see any contraindication for her to have the surgery.  I have already sent a note to Dr. Alphonsa Overall regarding low risk from cardiac standpoint.  She does indeed have left hemiplegia, hypertension, age and metastatic disease as risk factors from noncardiac  standpoint.  Happy to help in case she needs hospital admission for cardiac reasons.  Otherwise she is stable from cardiac standpoint, I will see her back on a as needed basis.  Symptoms of palpitations have stabilized on beta-blocker therapy.  Again she did not bring her medications and I have discontinued metoprolol succinate as it appears that she may be on metoprolol tartrate.  I discussed with her to carry a list of medication she takes at home.    Adrian Prows, MD, [facility] 01/29/2020, 1:34 PM Office: [phone]   CC: Alphonsa Overall, MD; Burney Gauze, MD

## 2020-01-29 NOTE — Telephone Encounter (Signed)
Appointments scheduled and Thane Edu, Rn will call and advise patient due to her daughter having other questions about labs. Per 10/7 sch msg

## 2020-01-29 NOTE — Telephone Encounter (Signed)
Message received from patient's daughter, [name] concerned about pt.'s RBC level being low.  Call placed back to [name] to notify her that Dr. Marin Olp would like for pt to get four doses of IV Venofer and to return stool cards when ready.  [name] is appreciative of call back and has no further questions at this time. Venofer infusion times reviewed with pt.'s daughter.

## 2020-01-30 ENCOUNTER — Encounter: Payer: Self-pay | Admitting: Podiatry

## 2020-01-30 ENCOUNTER — Other Ambulatory Visit: Payer: Self-pay | Admitting: Hematology & Oncology

## 2020-01-30 ENCOUNTER — Inpatient Hospital Stay: Payer: Medicare HMO

## 2020-01-30 NOTE — Telephone Encounter (Signed)
From patient.

## 2020-02-02 ENCOUNTER — Telehealth: Payer: Self-pay | Admitting: Hematology & Oncology

## 2020-02-02 ENCOUNTER — Telehealth: Payer: Self-pay | Admitting: *Deleted

## 2020-02-02 ENCOUNTER — Other Ambulatory Visit: Payer: Self-pay | Admitting: *Deleted

## 2020-02-02 ENCOUNTER — Encounter: Payer: Self-pay | Admitting: *Deleted

## 2020-02-02 DIAGNOSIS — Z95828 Presence of other vascular implants and grafts: Secondary | ICD-10-CM

## 2020-02-02 NOTE — Telephone Encounter (Signed)
Returned call to patient.  She was requesting that appointments for 10/12 & 10/15 be moved to earlier in the day.  I explained on VM that we were unable to acommodate this request due to our schedule be       Very full this week.per 10/11 sch msg

## 2020-02-02 NOTE — Telephone Encounter (Signed)
Tonya [name] called for her mom.  States that her mom has seen blood in her stool.  Dr Marin Olp notified.  Will order CBC and Hold Clot for when patient comes on Friday Oct 15.

## 2020-02-03 ENCOUNTER — Inpatient Hospital Stay: Payer: Medicare HMO

## 2020-02-03 DIAGNOSIS — L989 Disorder of the skin and subcutaneous tissue, unspecified: Secondary | ICD-10-CM | POA: Diagnosis not present

## 2020-02-03 DIAGNOSIS — D1720 Benign lipomatous neoplasm of skin and subcutaneous tissue of unspecified limb: Secondary | ICD-10-CM | POA: Diagnosis not present

## 2020-02-03 DIAGNOSIS — B353 Tinea pedis: Secondary | ICD-10-CM | POA: Diagnosis not present

## 2020-02-03 DIAGNOSIS — E1169 Type 2 diabetes mellitus with other specified complication: Secondary | ICD-10-CM | POA: Diagnosis not present

## 2020-02-05 ENCOUNTER — Telehealth: Payer: Self-pay | Admitting: Endocrinology

## 2020-02-05 NOTE — Telephone Encounter (Signed)
OK 

## 2020-02-05 NOTE — Telephone Encounter (Signed)
Patient requests to switch Dr's from [facility] to [facility].

## 2020-02-05 NOTE — Telephone Encounter (Signed)
From pt

## 2020-02-06 ENCOUNTER — Inpatient Hospital Stay: Payer: Medicare HMO

## 2020-02-06 ENCOUNTER — Other Ambulatory Visit: Payer: Self-pay | Admitting: *Deleted

## 2020-02-06 ENCOUNTER — Telehealth: Payer: Self-pay | Admitting: *Deleted

## 2020-02-06 ENCOUNTER — Other Ambulatory Visit: Payer: Self-pay

## 2020-02-06 ENCOUNTER — Other Ambulatory Visit: Payer: Self-pay | Admitting: Hematology & Oncology

## 2020-02-06 ENCOUNTER — Other Ambulatory Visit: Payer: Self-pay | Admitting: Family

## 2020-02-06 DIAGNOSIS — C189 Malignant neoplasm of colon, unspecified: Secondary | ICD-10-CM

## 2020-02-06 DIAGNOSIS — Z7901 Long term (current) use of anticoagulants: Secondary | ICD-10-CM | POA: Diagnosis not present

## 2020-02-06 DIAGNOSIS — C787 Secondary malignant neoplasm of liver and intrahepatic bile duct: Secondary | ICD-10-CM

## 2020-02-06 DIAGNOSIS — Z79899 Other long term (current) drug therapy: Secondary | ICD-10-CM | POA: Diagnosis not present

## 2020-02-06 DIAGNOSIS — D500 Iron deficiency anemia secondary to blood loss (chronic): Secondary | ICD-10-CM

## 2020-02-06 DIAGNOSIS — D649 Anemia, unspecified: Secondary | ICD-10-CM

## 2020-02-06 DIAGNOSIS — Z7982 Long term (current) use of aspirin: Secondary | ICD-10-CM | POA: Diagnosis not present

## 2020-02-06 DIAGNOSIS — I82402 Acute embolism and thrombosis of unspecified deep veins of left lower extremity: Secondary | ICD-10-CM

## 2020-02-06 DIAGNOSIS — D509 Iron deficiency anemia, unspecified: Secondary | ICD-10-CM | POA: Diagnosis not present

## 2020-02-06 DIAGNOSIS — C182 Malignant neoplasm of ascending colon: Secondary | ICD-10-CM | POA: Diagnosis not present

## 2020-02-06 DIAGNOSIS — Z9221 Personal history of antineoplastic chemotherapy: Secondary | ICD-10-CM | POA: Diagnosis not present

## 2020-02-06 DIAGNOSIS — Z86711 Personal history of pulmonary embolism: Secondary | ICD-10-CM | POA: Diagnosis not present

## 2020-02-06 LAB — CBC WITH DIFFERENTIAL (CANCER CENTER ONLY)
Abs Immature Granulocytes: 0.02 10*3/uL (ref 0.00–0.07)
Basophils Absolute: 0 10*3/uL (ref 0.0–0.1)
Basophils Relative: 0 %
Eosinophils Absolute: 0.1 10*3/uL (ref 0.0–0.5)
Eosinophils Relative: 1 %
HCT: 26.5 % — ABNORMAL LOW (ref 36.0–46.0)
Hemoglobin: 7.9 g/dL — ABNORMAL LOW (ref 12.0–15.0)
Immature Granulocytes: 0 %
Lymphocytes Relative: 16 %
Lymphs Abs: 1 10*3/uL (ref 0.7–4.0)
MCH: 27.2 pg (ref 26.0–34.0)
MCHC: 29.8 g/dL — ABNORMAL LOW (ref 30.0–36.0)
MCV: 91.4 fL (ref 80.0–100.0)
Monocytes Absolute: 0.6 10*3/uL (ref 0.1–1.0)
Monocytes Relative: 10 %
Neutro Abs: 4.7 10*3/uL (ref 1.7–7.7)
Neutrophils Relative %: 73 %
Platelet Count: 235 10*3/uL (ref 150–400)
RBC: 2.9 MIL/uL — ABNORMAL LOW (ref 3.87–5.11)
RDW: 16 % — ABNORMAL HIGH (ref 11.5–15.5)
WBC Count: 6.5 10*3/uL (ref 4.0–10.5)
nRBC: 0 % (ref 0.0–0.2)

## 2020-02-06 LAB — SAMPLE TO BLOOD BANK

## 2020-02-06 LAB — PREPARE RBC (CROSSMATCH)

## 2020-02-06 MED ORDER — HEPARIN SOD (PORK) LOCK FLUSH 100 UNIT/ML IV SOLN
500.0000 [IU] | Freq: Once | INTRAVENOUS | Status: AC | PRN
  Administered 2020-02-06: 500 [IU]
  Filled 2020-02-06: qty 5

## 2020-02-06 MED ORDER — SODIUM CHLORIDE 0.9% FLUSH
10.0000 mL | INTRAVENOUS | Status: DC | PRN
  Administered 2020-02-06: 10 mL
  Filled 2020-02-06: qty 10

## 2020-02-06 MED ORDER — SODIUM CHLORIDE 0.9 % IV SOLN
200.0000 mg | Freq: Once | INTRAVENOUS | Status: AC
  Administered 2020-02-06: 200 mg via INTRAVENOUS
  Filled 2020-02-06: qty 200

## 2020-02-06 NOTE — Progress Notes (Signed)
Dr. Marin Olp notified of HGB-7.9.  Order received for pt to get two units of blood on Monday, 02/09/20.  Message sent to scheduling.

## 2020-02-06 NOTE — Telephone Encounter (Signed)
Call placed to patient's daughter, [name] to inform her per order of Dr. Marin Olp to change Eliquis dose to one tablet daily until further instructions are received from Dr. Marin Olp.  Teach back done.  [name] is appreciative of call and has no questions at this time.

## 2020-02-06 NOTE — Patient Instructions (Signed)
Iron Sucrose injection (Venofer)  What is this medicine? IRON SUCROSE (AHY ern SOO krohs) is an iron complex. Iron is used to make healthy red blood cells, which carry oxygen and nutrients throughout the body. This medicine is used to treat iron deficiency anemia in people with chronic kidney disease. This medicine may be used for other purposes; ask your health care provider or pharmacist if you have questions. COMMON BRAND NAME(S): Venofer What should I tell my health care provider before I take this medicine? They need to know if you have any of these conditions:  anemia not caused by low iron levels  heart disease  high levels of iron in the blood  kidney disease  liver disease  an unusual or allergic reaction to iron, other medicines, foods, dyes, or preservatives  pregnant or trying to get pregnant  breast-feeding How should I use this medicine? This medicine is for infusion into a vein. It is given by a health care professional in a hospital or clinic setting. Talk to your pediatrician regarding the use of this medicine in children. While this drug may be prescribed for children as young as 2 years for selected conditions, precautions do apply. Overdosage: If you think you have taken too much of this medicine contact a poison control center or emergency room at once. NOTE: This medicine is only for you. Do not share this medicine with others. What if I miss a dose? It is important not to miss your dose. Call your doctor or health care professional if you are unable to keep an appointment. What may interact with this medicine? Do not take this medicine with any of the following medications:  deferoxamine  dimercaprol  other iron products This medicine may also interact with the following medications:  chloramphenicol  deferasirox This list may not describe all possible interactions. Give your health care provider a list of all the medicines, herbs, non-prescription  drugs, or dietary supplements you use. Also tell them if you smoke, drink alcohol, or use illegal drugs. Some items may interact with your medicine. What should I watch for while using this medicine? Visit your doctor or healthcare professional regularly. Tell your doctor or healthcare professional if your symptoms do not start to get better or if they get worse. You may need blood work done while you are taking this medicine. You may need to follow a special diet. Talk to your doctor. Foods that contain iron include: whole grains/cereals, dried fruits, beans, or peas, leafy green vegetables, and organ meats (liver, kidney). What side effects may I notice from receiving this medicine? Side effects that you should report to your doctor or health care professional as soon as possible:  allergic reactions like skin rash, itching or hives, swelling of the face, lips, or tongue  breathing problems  changes in blood pressure  cough  fast, irregular heartbeat  feeling faint or lightheaded, falls  fever or chills  flushing, sweating, or hot feelings  joint or muscle aches/pains  seizures  swelling of the ankles or feet  unusually weak or tired Side effects that usually do not require medical attention (report to your doctor or health care professional if they continue or are bothersome):  diarrhea  feeling achy  headache  irritation at site where injected  nausea, vomiting  stomach upset  tiredness This list may not describe all possible side effects. Call your doctor for medical advice about side effects. You may report side effects to FDA at 1-800-FDA-1088. Where should   I keep my medicine? This drug is given in a hospital or clinic and will not be stored at home. NOTE: This sheet is a summary. It may not cover all possible information. If you have questions about this medicine, talk to your doctor, pharmacist, or health care provider.  2020 Elsevier/Gold Standard  (2011-01-19 17:14:35)  

## 2020-02-09 ENCOUNTER — Inpatient Hospital Stay: Payer: Medicare HMO

## 2020-02-09 ENCOUNTER — Other Ambulatory Visit: Payer: Self-pay

## 2020-02-09 VITALS — BP 138/57 | HR 76 | Temp 98.5°F | Resp 17

## 2020-02-09 DIAGNOSIS — Z7982 Long term (current) use of aspirin: Secondary | ICD-10-CM | POA: Diagnosis not present

## 2020-02-09 DIAGNOSIS — C189 Malignant neoplasm of colon, unspecified: Secondary | ICD-10-CM

## 2020-02-09 DIAGNOSIS — D509 Iron deficiency anemia, unspecified: Secondary | ICD-10-CM | POA: Diagnosis not present

## 2020-02-09 DIAGNOSIS — Z9221 Personal history of antineoplastic chemotherapy: Secondary | ICD-10-CM | POA: Diagnosis not present

## 2020-02-09 DIAGNOSIS — C182 Malignant neoplasm of ascending colon: Secondary | ICD-10-CM | POA: Diagnosis not present

## 2020-02-09 DIAGNOSIS — C787 Secondary malignant neoplasm of liver and intrahepatic bile duct: Secondary | ICD-10-CM

## 2020-02-09 DIAGNOSIS — D500 Iron deficiency anemia secondary to blood loss (chronic): Secondary | ICD-10-CM

## 2020-02-09 DIAGNOSIS — Z7901 Long term (current) use of anticoagulants: Secondary | ICD-10-CM | POA: Diagnosis not present

## 2020-02-09 DIAGNOSIS — Z79899 Other long term (current) drug therapy: Secondary | ICD-10-CM | POA: Diagnosis not present

## 2020-02-09 DIAGNOSIS — Z86711 Personal history of pulmonary embolism: Secondary | ICD-10-CM | POA: Diagnosis not present

## 2020-02-09 DIAGNOSIS — D649 Anemia, unspecified: Secondary | ICD-10-CM

## 2020-02-09 MED ORDER — SODIUM CHLORIDE 0.9 % IV SOLN
INTRAVENOUS | Status: DC
  Filled 2020-02-09: qty 250

## 2020-02-09 MED ORDER — LIDOCAINE-PRILOCAINE 2.5-2.5 % EX CREA: 1.0000 "application " | TOPICAL_CREAM | CUTANEOUS | 1 refills | Status: DC | PRN

## 2020-02-09 MED ORDER — SODIUM CHLORIDE 0.9% IV SOLUTION
250.0000 mL | Freq: Once | INTRAVENOUS | Status: DC
  Filled 2020-02-09: qty 250

## 2020-02-09 MED ORDER — DIPHENHYDRAMINE HCL 25 MG PO CAPS
ORAL_CAPSULE | ORAL | Status: AC
  Filled 2020-02-09: qty 1

## 2020-02-09 MED ORDER — TRAMADOL HCL 50 MG PO TABS
ORAL_TABLET | ORAL | 3 refills | Status: DC
Start: 2020-02-09 — End: 2020-08-31

## 2020-02-09 MED ORDER — ACETAMINOPHEN 325 MG PO TABS
ORAL_TABLET | ORAL | Status: AC
  Filled 2020-02-09: qty 2

## 2020-02-09 MED ORDER — SODIUM CHLORIDE 0.9 % IV SOLN
200.0000 mg | Freq: Once | INTRAVENOUS | Status: AC
  Administered 2020-02-09: 200 mg via INTRAVENOUS
  Filled 2020-02-09: qty 200

## 2020-02-09 MED ORDER — SODIUM CHLORIDE 0.9% FLUSH
3.0000 mL | Freq: Once | INTRAVENOUS | Status: DC | PRN
  Filled 2020-02-09: qty 10

## 2020-02-09 MED ORDER — KETOCONAZOLE 2 % EX CREA: 1.0000 "application " | TOPICAL_CREAM | Freq: Two times a day (BID) | CUTANEOUS | 2 refills | Status: DC

## 2020-02-09 MED ORDER — SODIUM CHLORIDE 0.9% FLUSH
10.0000 mL | INTRAVENOUS | Status: DC | PRN
  Filled 2020-02-09: qty 10

## 2020-02-09 MED ORDER — HEPARIN SOD (PORK) LOCK FLUSH 100 UNIT/ML IV SOLN
500.0000 [IU] | Freq: Once | INTRAVENOUS | Status: AC | PRN
  Administered 2020-02-09: 500 [IU]
  Filled 2020-02-09: qty 5

## 2020-02-09 MED ORDER — DIPHENHYDRAMINE HCL 25 MG PO CAPS
25.0000 mg | ORAL_CAPSULE | Freq: Once | ORAL | Status: AC
  Administered 2020-02-09: 25 mg via ORAL

## 2020-02-09 MED ORDER — HEPARIN SOD (PORK) LOCK FLUSH 100 UNIT/ML IV SOLN
250.0000 [IU] | Freq: Once | INTRAVENOUS | Status: DC | PRN
  Filled 2020-02-09: qty 5

## 2020-02-09 MED ORDER — SODIUM CHLORIDE 0.9% FLUSH
10.0000 mL | INTRAVENOUS | Status: AC | PRN
  Administered 2020-02-09: 10 mL
  Filled 2020-02-09: qty 10

## 2020-02-09 MED ORDER — HEPARIN SOD (PORK) LOCK FLUSH 100 UNIT/ML IV SOLN
250.0000 [IU] | INTRAVENOUS | Status: DC | PRN
  Filled 2020-02-09: qty 5

## 2020-02-09 MED ORDER — ACETAMINOPHEN 325 MG PO TABS
650.0000 mg | ORAL_TABLET | Freq: Once | ORAL | Status: AC
  Administered 2020-02-09: 650 mg via ORAL

## 2020-02-09 MED ORDER — ALTEPLASE 2 MG IJ SOLR
2.0000 mg | Freq: Once | INTRAMUSCULAR | Status: DC | PRN
  Filled 2020-02-09: qty 2

## 2020-02-09 NOTE — Patient Instructions (Signed)

## 2020-02-09 NOTE — Patient Instructions (Signed)

## 2020-02-10 ENCOUNTER — Other Ambulatory Visit: Payer: Self-pay | Admitting: Radiology

## 2020-02-10 ENCOUNTER — Other Ambulatory Visit: Payer: Self-pay | Admitting: Surgery

## 2020-02-10 LAB — BPAM RBC
Blood Product Expiration Date: 202110242359
Blood Product Expiration Date: 202110242359
ISSUE DATE / TIME: 202110180812
ISSUE DATE / TIME: 202110180812
Unit Type and Rh: 600
Unit Type and Rh: 600

## 2020-02-10 LAB — TYPE AND SCREEN
ABO/RH(D): A POS
Antibody Screen: NEGATIVE
Unit division: 0
Unit division: 0

## 2020-02-10 NOTE — H&P (Signed)
Chief Complaint: Patient was seen in consultation today for inferior vena cava filter placement.   Referring Physician(s): [name]  Supervising Physician: Aletta Edouard  Patient Status: [facility] - Out-pt  History of Present Illness: Tonya [name] is a 79 y.o. female with a medical history significant for HTN, DM2, COPD, stroke, metastatic colon cancer and left leg DVTs for which she takes Eliquis. Recent lab works shows persistent anemia and guaiac positive stools. Her healthcare team would like to stop her Eliquis.   Interventional Radiology has been asked to evaluate this patient for the placement of an inferior vena cava filter.   Past Medical History:  Diagnosis Date  . Cerebrovascular accident ([place])   . Chronic edema    ? venous insufficiency  . COPD (chronic obstructive pulmonary disease) ([place])    never had a problem breathing  . Diabetes mellitus without complication ([place])   . Enlarged heart    per pt  . Gallstones   . GERD (gastroesophageal reflux disease)   . Goals of care, counseling/discussion 11/01/2018  . Hyperlipidemia   . Hypertension     Past Surgical History:  Procedure Laterality Date  . ABDOMINAL HYSTERECTOMY    . BIOPSY  10/18/2018   Procedure: BIOPSY;  Surgeon: Jackquline Denmark, MD;  Location: [facility] ENDOSCOPY;  Service: Endoscopy;;  . CHOLECYSTECTOMY    . COLONOSCOPY WITH PROPOFOL N/A 10/18/2018   Procedure: COLONOSCOPY WITH PROPOFOL;  Surgeon: Jackquline Denmark, MD;  Location: [facility] ENDOSCOPY;  Service: Endoscopy;  Laterality: N/A;  . IR IMAGING GUIDED PORT INSERTION  11/11/2018  . POLYPECTOMY  10/18/2018   Procedure: POLYPECTOMY;  Surgeon: Jackquline Denmark, MD;  Location: [facility] ENDOSCOPY;  Service: Endoscopy;;  . SUBMUCOSAL TATTOO INJECTION  10/18/2018   Procedure: SUBMUCOSAL TATTOO INJECTION;  Surgeon: Jackquline Denmark, MD;  Location: [facility] ENDOSCOPY;  Service: Endoscopy;;    Allergies: Hydrocodone, Iohexol, Naproxen, Ibuprofen, and Propoxyphene  n-acetaminophen  Medications: Prior to Admission medications   Medication Sig Start Date End Date Taking? Authorizing Provider  Alcohol Swabs (B-D SINGLE USE SWABS REGULAR) PADS 1 each by Does not apply route as needed (to cleanse site prior to obtaining droplet of blood for CBG's). E11.9 07/08/19   Renato Shin, MD  aspirin EC 81 MG tablet Take 81 mg by mouth daily.    [provider]  atorvastatin (LIPITOR) 10 MG tablet Take 1 tablet (10 mg total) by mouth daily. 09/01/19   Corum, Rex Kras, MD  B-D ULTRAFINE III SHORT PEN 31G X 8 MM MISC  09/01/19   [provider]  Cholecalciferol 2000 units TABS Take 1 tablet (2,000 Units total) by mouth daily. 07/17/17   Janith Lima, MD  dexamethasone (DECADRON) 4 MG tablet Take 2 tablets (8 mg total) by mouth 2 (two) times daily. For four days following chemotherapy. 12/10/19   [name], MD  ELIQUIS 5 MG TABS tablet TAKE 1 TABLET(5 MG) BY MOUTH TWICE DAILY 11/21/19   [name], MD  famotidine (PEPCID) 40 MG tablet Take 1 tablet (40 mg total) by mouth 2 (two) times daily. 08/19/19   [name], MD  insulin degludec (TRESIBA FLEXTOUCH) 100 UNIT/ML FlexTouch Pen Inject 0.21 mLs (21 Units total) into the skin daily. Take 34 units on chemo days 09/19/19   Renato Shin, MD  ketoconazole (NIZORAL) 2 % cream Apply 1 application topically 2 (two) times daily. 02/09/20   McDonald, Stephan Minister, DPM  lidocaine-prilocaine (EMLA) cream Apply 1 application topically as needed for up to  30 doses. 02/09/20   [name], MD  loperamide (IMODIUM A-D) 2 MG tablet Take 2 tablets (4 mg total) by mouth 4 (four) times daily as needed. Take 2 at diarrhea onset , then 1 every 2hr until 12hrs with no BM. May take 2 every 4hrs at night. If diarrhea recurs repeat. 01/20/19   [name], MD  losartan (COZAAR) 50 MG tablet Take 50 mg by mouth daily.  10/27/19   [provider]  magic mouthwash SOLN Swish and swallow equal parts Benadryl,  Lidocaine and Nystatin.  5- 10 ml.'s four times a day as needed. 05/27/19   [name], MD  meclizine (ANTIVERT) 12.5 MG tablet TAKE 1 TABLET(12.5 MG) BY MOUTH THREE TIMES DAILY AS NEEDED FOR DIZZINESS 10/31/19   [place], Holli Humbles, NP  metoprolol tartrate (LOPRESSOR) 25 MG tablet Take 1 tablet (25 mg total) by mouth 2 (two) times daily. 09/01/19   Corum, Rex Kras, MD  olmesartan (BENICAR) 20 MG tablet Take 1 tablet (20 mg total) by mouth daily. 08/14/19   Corum, Rex Kras, MD  traMADol (ULTRAM) 50 MG tablet TAKE 1 TABLET(50 MG) BY MOUTH EVERY 6 HOURS AS NEEDED 02/09/20   [name], MD  TRUE METRIX BLOOD GLUCOSE TEST test strip TEST BLOOD SUGAR TWICE DAILY 01/16/20   Renato Shin, MD  TRUEplus Lancets 30G MISC 1 each by Does not apply route 2 (two) times daily. E11.9 07/08/19   Renato Shin, MD  prochlorperazine (COMPAZINE) 10 MG tablet Take 1 tablet (10 mg total) by mouth every 6 (six) hours as needed (Nausea or vomiting). 11/07/18 12/15/19  [name], MD     Family History  Problem Relation Age of Onset  . Cirrhosis Mother   . Heart disease Father   . Cancer Other   . Hypertension Other   . Diabetes Other     Social History   Socioeconomic History  . Marital status: Widowed    Spouse name: Not on file  . Number of children: 5  . Years of education: Not on file  . Highest education level: Not on file  Occupational History  . Not on file  Tobacco Use  . Smoking status: Former Smoker    Packs/day: 0.25    Years: 1.00    Pack years: 0.25    Types: Cigarettes    Quit date: 07/23/1968    Years since quitting: 51.5  . Smokeless tobacco: Never Used  Vaping Use  . Vaping Use: Never used  Substance and Sexual Activity  . Alcohol use: No    Alcohol/week: 0.0 standard drinks  . Drug use: No  . Sexual activity: Not Currently  Other Topics Concern  . Not on file  Social History Narrative  . Not on file   Social Determinants of Health   Financial Resource Strain:   .  Difficulty of Paying Living Expenses: Not on file  Food Insecurity:   . Worried About Charity fundraiser in the Last Year: Not on file  . Ran Out of Food in the Last Year: Not on file  Transportation Needs:   . Lack of Transportation (Medical): Not on file  . Lack of Transportation (Non-Medical): Not on file  Physical Activity:   . Days of Exercise per Week: Not on file  . Minutes of Exercise per Session: Not on file  Stress:   . Feeling of Stress : Not on file  Social Connections:   . Frequency of Communication with  Friends and Family: Not on file  . Frequency of Social Gatherings with Friends and Family: Not on file  . Attends Religious Services: Not on file  . Active Member of Clubs or Organizations: Not on file  . Attends Archivist Meetings: Not on file  . Marital Status: Not on file    Review of Systems: A 12 point ROS discussed and pertinent positives are indicated in the HPI above.  All other systems are negative.  Review of Systems  Constitutional: Negative for appetite change and fatigue.  Respiratory: Negative for cough and shortness of breath.   Cardiovascular: Positive for leg swelling. Negative for chest pain.  Gastrointestinal: Positive for abdominal pain and blood in stool. Negative for diarrhea, nausea and vomiting.  Neurological: Negative for headaches.    Vital Signs: BP 134/70   Pulse 65   Temp 98 F (36.7 C)   Resp 16   SpO2 100%   Physical Exam Constitutional:      General: She is not in acute distress. HENT:     Mouth/Throat:     Mouth: Mucous membranes are moist.     Pharynx: Oropharynx is clear.  Cardiovascular:     Rate and Rhythm: Normal rate and regular rhythm.     Pulses: Normal pulses.     Heart sounds: Normal heart sounds.     Comments: Right upper chest PAC - accessed  Pulmonary:     Effort: Pulmonary effort is normal.     Breath sounds: Normal breath sounds.  Abdominal:     General: Bowel sounds are normal.      Palpations: Abdomen is soft.     Tenderness: There is abdominal tenderness.     Comments: Generalized tenderness  Genitourinary:    Comments: Patient reports blood in her stool.  Musculoskeletal:     Left lower leg: Edema present.     Comments: Left lower extremity swelling; DVT  Skin:    General: Skin is warm and dry.  Neurological:     Mental Status: She is alert and oriented to person, place, and time.     Imaging: US Venous Img Lower Unilateral Left (DVT)  Result Date: 01/30/2020 CLINICAL DATA:  Follow-up left lower extremity DVT. EXAM: LEFT LOWER EXTREMITY VENOUS DOPPLER ULTRASOUND TECHNIQUE: Gray-scale sonography with graded compression, as well as color Doppler and duplex ultrasound were performed to evaluate the lower extremity deep venous systems from the level of the common femoral vein and including the common femoral, femoral, profunda femoral, popliteal and calf veins including the posterior tibial, peroneal and gastrocnemius veins when visible. The superficial great saphenous vein was also interrogated. Spectral Doppler was utilized to evaluate flow at rest and with distal augmentation maneuvers in the common femoral, femoral and popliteal veins. COMPARISON:  10/23/2019; 10/01/2019 FINDINGS: Contralateral Common Femoral Vein: Respiratory phasicity is normal and symmetric with the symptomatic side. No evidence of thrombus. Normal compressibility. Common Femoral Vein: No evidence of thrombus. Normal compressibility, respiratory phasicity and response to augmentation. Saphenofemoral Junction: No evidence of thrombus. Normal compressibility and flow on color Doppler imaging. Profunda Femoral Vein: No evidence of thrombus. Normal compressibility and flow on color Doppler imaging. Femoral Vein: No evidence of thrombus. Normal compressibility, respiratory phasicity and response to augmentation. Popliteal Vein: There is a minimal amount of nonocclusive wall thickening/chronic DVT involving  the popliteal vein, unchanged to slightly improved compared to the 10/23/2019 examination. Calf Veins: No evidence of acute or chronic thrombus with special attention paid to the left peroneal  veins. Normal compressibility and flow on color Doppler imaging. Superficial Great Saphenous Vein: No evidence of thrombus. Normal compressibility. Venous Reflux:  None. Other Findings:  None. IMPRESSION: 1. No evidence of acute DVT within left lower extremity. 2. Minimal amount nonocclusive wall thickening/chronic DVT involving the left popliteal vein, unchanged to slightly improved compared to the 10/23/2019 examination. Electronically Signed   By: Sandi Mariscal M.D.   On: 01/30/2020 08:43    Labs:  CBC: Recent Labs    12/31/19 0920 01/28/20 0853 02/06/20 0825 02/11/20 0840  WBC 5.9 7.5 6.5 7.5  HGB 9.7* 8.6* 7.9* 11.3*  HCT 32.3* 28.5* 26.5* 36.2  PLT 242 222 235 272    COAGS: No results for input(s): INR, APTT in the last 8760 hours.  BMP: Recent Labs    11/12/19 0815 11/12/19 0815 11/19/19 0800 12/10/19 0835 12/31/19 0920 01/28/20 0853  NA 144   < > 144 142 143 142  K 3.6   < > 3.7 3.8 3.8 4.0  CL 108   < > 108 108 108 107  CO2 29   < > 30 29 28 28   GLUCOSE 177*   < > 165* 165* 144* 148*  BUN 9   < > 15 11 11 14   CALCIUM 9.7   < > 9.6 9.7 9.5 9.6  CREATININE 0.63   < > 0.72 0.76 0.74 0.77  GFRNONAA >60   < > >60 >60 >60 >60  GFRAA >60  --  >60 >60 >60  --    < > = values in this interval not displayed.    LIVER FUNCTION TESTS: Recent Labs    11/19/19 0800 12/10/19 0835 12/31/19 0920 01/28/20 0853  BILITOT 0.5 0.6 0.6 0.4  AST 17 15 15 16   ALT 13 14 12 10   ALKPHOS 64 68 59 58  PROT 5.6* 5.6* 5.4* 5.5*  ALBUMIN 3.7 3.7 3.5 3.6    TUMOR MARKERS: No results for input(s): AFPTM, CEA, CA199, CHROMGRNA in the last 8760 hours.  Assessment and Plan:  Left leg DVTs, Anemia/GI bleed associated with metastatic colon cancer: Delrae Alfred. Tonya [name], 79 year old female, presents  today to the [facility] department for an image-guided inferior vena cava filter placement.  Risks and benefits discussed with the patient including, but not limited to bleeding, infection, contrast induced renal failure, filter fracture or migration which can lead to emergency surgery or even death, strut penetration with damage or irritation to adjacent structures and caval thrombosis.  All of the patient's questions were answered, patient is agreeable to proceed.  The patient has been NPO. Vitals have been reviewed. Labs are still pending but will be reviewed prior to the start of the procedure. She last took Eliquis 02/10/20 in the morning.   Consent signed and in chart.  Thank you for this interesting consult.  I greatly enjoyed meeting THERA BASDEN and look forward to participating in their care.  A copy of this report was sent to the requesting provider on this date.  Electronically Signed: Soyla Dryer, AGACNP-BC [phone] 02/11/2020, 9:17 AM   I spent a total of  30 Minutes   in face to face in clinical consultation, greater than 50% of which was counseling/coordinating care for inferior vena cava filter placement.

## 2020-02-11 ENCOUNTER — Ambulatory Visit (HOSPITAL_COMMUNITY)
Admission: RE | Admit: 2020-02-11 | Discharge: 2020-02-11 | Disposition: A | Discharge status: Home / Self Care | Payer: Medicare HMO | Source: Ambulatory Visit | Attending: Hematology & Oncology | Admitting: Hematology & Oncology

## 2020-02-11 ENCOUNTER — Other Ambulatory Visit: Payer: Self-pay

## 2020-02-11 ENCOUNTER — Inpatient Hospital Stay: Payer: Medicare HMO

## 2020-02-11 ENCOUNTER — Inpatient Hospital Stay: Payer: Medicare HMO | Admitting: Family

## 2020-02-11 ENCOUNTER — Encounter (HOSPITAL_COMMUNITY): Payer: Self-pay

## 2020-02-11 DIAGNOSIS — K219 Gastro-esophageal reflux disease without esophagitis: Secondary | ICD-10-CM | POA: Diagnosis not present

## 2020-02-11 DIAGNOSIS — D649 Anemia, unspecified: Secondary | ICD-10-CM | POA: Diagnosis not present

## 2020-02-11 DIAGNOSIS — I10 Essential (primary) hypertension: Secondary | ICD-10-CM | POA: Diagnosis not present

## 2020-02-11 DIAGNOSIS — Z794 Long term (current) use of insulin: Secondary | ICD-10-CM | POA: Diagnosis not present

## 2020-02-11 DIAGNOSIS — Z8673 Personal history of transient ischemic attack (TIA), and cerebral infarction without residual deficits: Secondary | ICD-10-CM | POA: Diagnosis not present

## 2020-02-11 DIAGNOSIS — Z79899 Other long term (current) drug therapy: Secondary | ICD-10-CM | POA: Diagnosis not present

## 2020-02-11 DIAGNOSIS — Z7901 Long term (current) use of anticoagulants: Secondary | ICD-10-CM | POA: Diagnosis not present

## 2020-02-11 DIAGNOSIS — E119 Type 2 diabetes mellitus without complications: Secondary | ICD-10-CM | POA: Insufficient documentation

## 2020-02-11 DIAGNOSIS — Z85038 Personal history of other malignant neoplasm of large intestine: Secondary | ICD-10-CM | POA: Insufficient documentation

## 2020-02-11 DIAGNOSIS — E785 Hyperlipidemia, unspecified: Secondary | ICD-10-CM | POA: Insufficient documentation

## 2020-02-11 DIAGNOSIS — J449 Chronic obstructive pulmonary disease, unspecified: Secondary | ICD-10-CM | POA: Insufficient documentation

## 2020-02-11 DIAGNOSIS — C785 Secondary malignant neoplasm of large intestine and rectum: Secondary | ICD-10-CM | POA: Diagnosis not present

## 2020-02-11 DIAGNOSIS — Z87891 Personal history of nicotine dependence: Secondary | ICD-10-CM | POA: Insufficient documentation

## 2020-02-11 DIAGNOSIS — Z7982 Long term (current) use of aspirin: Secondary | ICD-10-CM | POA: Insufficient documentation

## 2020-02-11 DIAGNOSIS — K922 Gastrointestinal hemorrhage, unspecified: Secondary | ICD-10-CM | POA: Diagnosis not present

## 2020-02-11 DIAGNOSIS — I82402 Acute embolism and thrombosis of unspecified deep veins of left lower extremity: Secondary | ICD-10-CM | POA: Insufficient documentation

## 2020-02-11 DIAGNOSIS — D62 Acute posthemorrhagic anemia: Secondary | ICD-10-CM | POA: Diagnosis not present

## 2020-02-11 DIAGNOSIS — C189 Malignant neoplasm of colon, unspecified: Secondary | ICD-10-CM

## 2020-02-11 DIAGNOSIS — C787 Secondary malignant neoplasm of liver and intrahepatic bile duct: Secondary | ICD-10-CM

## 2020-02-11 HISTORY — DX: Other specified postprocedural states: Z98.890

## 2020-02-11 HISTORY — DX: Dyspnea, unspecified: R06.00

## 2020-02-11 HISTORY — DX: Other specified postprocedural states: R11.2

## 2020-02-11 HISTORY — PX: IR IVC FILTER PLMT / S&I /IMG GUID/MOD SED: IMG701

## 2020-02-11 LAB — CBC WITH DIFFERENTIAL/PLATELET
Abs Immature Granulocytes: 0.02 10*3/uL (ref 0.00–0.07)
Basophils Absolute: 0 10*3/uL (ref 0.0–0.1)
Basophils Relative: 0 %
Eosinophils Absolute: 0.1 10*3/uL (ref 0.0–0.5)
Eosinophils Relative: 2 %
HCT: 36.2 % (ref 36.0–46.0)
Hemoglobin: 11.3 g/dL — ABNORMAL LOW (ref 12.0–15.0)
Immature Granulocytes: 0 %
Lymphocytes Relative: 15 %
Lymphs Abs: 1.1 10*3/uL (ref 0.7–4.0)
MCH: 28.8 pg (ref 26.0–34.0)
MCHC: 31.2 g/dL (ref 30.0–36.0)
MCV: 92.3 fL (ref 80.0–100.0)
Monocytes Absolute: 0.7 10*3/uL (ref 0.1–1.0)
Monocytes Relative: 9 %
Neutro Abs: 5.6 10*3/uL (ref 1.7–7.7)
Neutrophils Relative %: 74 %
Platelets: 272 10*3/uL (ref 150–400)
RBC: 3.92 MIL/uL (ref 3.87–5.11)
RDW: 16.4 % — ABNORMAL HIGH (ref 11.5–15.5)
WBC: 7.5 10*3/uL (ref 4.0–10.5)
nRBC: 0 % (ref 0.0–0.2)

## 2020-02-11 LAB — GLUCOSE, CAPILLARY: Glucose-Capillary: 107 mg/dL — ABNORMAL HIGH (ref 70–99)

## 2020-02-11 LAB — PROTIME-INR
INR: 1.2 (ref 0.8–1.2)
Prothrombin Time: 14.5 seconds (ref 11.4–15.2)

## 2020-02-11 MED ORDER — HEPARIN SOD (PORK) LOCK FLUSH 100 UNIT/ML IV SOLN
500.0000 [IU] | Freq: Once | INTRAVENOUS | Status: AC
  Administered 2020-02-11: 500 [IU] via INTRAVENOUS
  Filled 2020-02-11: qty 5

## 2020-02-11 MED ORDER — FENTANYL CITRATE (PF) 100 MCG/2ML IJ SOLN
INTRAMUSCULAR | Status: AC | PRN
  Administered 2020-02-11 (×2): 50 ug via INTRAVENOUS

## 2020-02-11 MED ORDER — MIDAZOLAM HCL 2 MG/2ML IJ SOLN
INTRAMUSCULAR | Status: AC | PRN
  Administered 2020-02-11 (×2): 1 mg via INTRAVENOUS

## 2020-02-11 MED ORDER — MIDAZOLAM HCL 2 MG/2ML IJ SOLN
INTRAMUSCULAR | Status: AC
  Filled 2020-02-11: qty 2

## 2020-02-11 MED ORDER — LIDOCAINE HCL 1 % IJ SOLN
INTRAMUSCULAR | Status: AC
  Filled 2020-02-11: qty 20

## 2020-02-11 MED ORDER — FENTANYL CITRATE (PF) 100 MCG/2ML IJ SOLN
INTRAMUSCULAR | Status: AC
  Filled 2020-02-11: qty 2

## 2020-02-11 MED ORDER — LIDOCAINE HCL (PF) 1 % IJ SOLN
INTRAMUSCULAR | Status: AC | PRN
  Administered 2020-02-11: 10 mL

## 2020-02-11 MED ORDER — SODIUM CHLORIDE 0.9 % IV SOLN: INTRAVENOUS | Status: DC

## 2020-02-11 MED ORDER — HEPARIN SODIUM (PORCINE) 5000 UNIT/ML IJ SOLN: 500.0000 [IU] | Freq: Once | INTRAMUSCULAR | Status: DC

## 2020-02-11 NOTE — Discharge Instructions (Signed)
Urgent needs - IR on call MD [phone]  Wound - May remove dressing and shower in 24 hours.  Keep site clean and dry.  Replace with bandaid. Do not submerge in tub or water until site healing well.   Inferior Vena Cava Filter Insertion, Care After This sheet gives you information about how to care for yourself after your procedure. Your health care provider may also give you more specific instructions. If you have problems or questions, contact your health care provider. What can I expect after the procedure? After your procedure, it is common to have:  Mild pain in the area where the filter was inserted.  Mild bruising in the area where the filter was inserted. Follow these instructions at home: Insertion site care  Follow instructions from your health care provider about how to take care of the site where a catheter was inserted at your neck or groin (insertion site, above). Make sure you: ? Wash your hands with soap and water before you change your bandage (dressing). If soap and water are not available, use hand sanitizer. ? Change your dressing as told by your health care provider. (above)  Check your insertion site every day for signs of infection. Check for: ? More redness, swelling, or pain. ? More fluid or blood. ? Warmth. ? Pus or a bad smell.  Keep the insertion site clean and dry.  Do not shower, bathe, use a hot tub, or let the dressing get wet until your health care provider approves. General instructions  Take over-the-counter and prescription medicines only as told by your health care provider.  Avoid heavy lifting or hard activities for 48 hours after the procedure or as told by your health care provider.  Do not drive for 24 hours if you were given a medicine to help you relax (sedative).  Do not drive or use heavy machinery while taking prescription pain medicine.  Do not go back to school or work until your health care provider approves.  Keep all  follow-up visits as told by your health care provider. This is important. Contact a health care provider if:  You have more redness, swelling, or pain around your insertion site.  You have more fluid or blood coming from your insertion site.  Your insertion site feels warm to the touch.  You have pus or a bad smell coming from your insertion site.  You have a fever.  You are dizzy.  You have nausea and vomiting.  You develop a rash. Get help right away if:  You develop chest pain, a cough, or difficulty breathing.  You develop shortness of breath, feel faint, or pass out.  You cough up blood.  You have severe pain in your abdomen.  You develop swelling and discoloration or pain in your legs.  Your legs become pale and cold or blue.  You develop weakness, difficulty moving your arms or legs, or balance problems.  You develop problems with speech or vision. These symptoms may represent a serious problem that is an emergency. Do not wait to see if the symptoms will go away. Get medical help right away. Call your local emergency services (911 in the [place] Do not drive yourself to the hospital. Summary  After your insertion procedure, it is common to have mild pain and bruising.  Do not shower, bathe, use a hot tub, or let the dressing get wet until your health care provider approves.  Every day, check for signs of infection where a catheter  was inserted at your neck or groin (insertion site). This information is not intended to replace advice given to you by your health care provider. Make sure you discuss any questions you have with your health care provider. Document Revised: 03/23/2017 Document Reviewed: 03/01/2016 Elsevier Patient Education  [place].  Moderate Conscious Sedation, Adult, Care After These instructions provide you with information about caring for yourself after your procedure. Your health care provider may also give you more specific  instructions. Your treatment has been planned according to current medical practices, but problems sometimes occur. Call your health care provider if you have any problems or questions after your procedure. What can I expect after the procedure? After your procedure, it is common:  To feel sleepy for several hours.  To feel clumsy and have poor balance for several hours.  To have poor judgment for several hours.  To vomit if you eat too soon. Follow these instructions at home:  For at least 24 hours after the procedure:  Do not: ? Participate in activities where you could fall or become injured. ? Drive. ? Use heavy machinery. ? Drink alcohol. ? Take sleeping pills or medicines that cause drowsiness. ? Make important decisions or sign legal documents. ? Take care of children on your own.  Rest. Eating and drinking  Follow the diet recommended by your health care provider.  If you vomit: ? Drink water, juice, or soup when you can drink without vomiting. ? Make sure you have little or no nausea before eating solid foods. General instructions  Have a responsible adult stay with you until you are awake and alert.  Take over-the-counter and prescription medicines only as told by your health care provider.  If you smoke, do not smoke without supervision.  Keep all follow-up visits as told by your health care provider. This is important. Contact a health care provider if:  You keep feeling nauseous or you keep vomiting.  You feel light-headed.  You develop a rash.  You have a fever. Get help right away if:  You have trouble breathing. This information is not intended to replace advice given to you by your health care provider. Make sure you discuss any questions you have with your health care provider. Document Revised: 03/23/2017 Document Reviewed: 07/31/2015 Elsevier Patient Education  2020 [employer].

## 2020-02-11 NOTE — Procedures (Signed)
Interventional Radiology Procedure Note  Procedure: IVC filter placement  Complications: None  Estimated Blood Loss: < 8mL  Findings: Right CFV access. CO2 IVC venogram shows normal patency and caliber of IVC. [place] IVC filter placed in infrarenal IVC.  Venetia Night. Kathlene Cote, M.D Pager:  [phone]

## 2020-02-12 DIAGNOSIS — D500 Iron deficiency anemia secondary to blood loss (chronic): Secondary | ICD-10-CM | POA: Diagnosis not present

## 2020-02-12 DIAGNOSIS — D630 Anemia in neoplastic disease: Secondary | ICD-10-CM | POA: Diagnosis not present

## 2020-02-12 DIAGNOSIS — E785 Hyperlipidemia, unspecified: Secondary | ICD-10-CM | POA: Diagnosis not present

## 2020-02-12 DIAGNOSIS — I10 Essential (primary) hypertension: Secondary | ICD-10-CM | POA: Diagnosis not present

## 2020-02-12 DIAGNOSIS — C189 Malignant neoplasm of colon, unspecified: Secondary | ICD-10-CM | POA: Diagnosis not present

## 2020-02-12 DIAGNOSIS — E1169 Type 2 diabetes mellitus with other specified complication: Secondary | ICD-10-CM | POA: Diagnosis not present

## 2020-02-13 ENCOUNTER — Inpatient Hospital Stay: Payer: Medicare HMO

## 2020-02-13 ENCOUNTER — Other Ambulatory Visit: Payer: Self-pay

## 2020-02-13 VITALS — BP 139/65 | HR 79 | Temp 98.2°F | Resp 17

## 2020-02-13 DIAGNOSIS — Z79899 Other long term (current) drug therapy: Secondary | ICD-10-CM | POA: Diagnosis not present

## 2020-02-13 DIAGNOSIS — D509 Iron deficiency anemia, unspecified: Secondary | ICD-10-CM | POA: Diagnosis not present

## 2020-02-13 DIAGNOSIS — D500 Iron deficiency anemia secondary to blood loss (chronic): Secondary | ICD-10-CM

## 2020-02-13 DIAGNOSIS — Z7901 Long term (current) use of anticoagulants: Secondary | ICD-10-CM | POA: Diagnosis not present

## 2020-02-13 DIAGNOSIS — Z7982 Long term (current) use of aspirin: Secondary | ICD-10-CM | POA: Diagnosis not present

## 2020-02-13 DIAGNOSIS — Z9221 Personal history of antineoplastic chemotherapy: Secondary | ICD-10-CM | POA: Diagnosis not present

## 2020-02-13 DIAGNOSIS — C182 Malignant neoplasm of ascending colon: Secondary | ICD-10-CM | POA: Diagnosis not present

## 2020-02-13 DIAGNOSIS — Z86711 Personal history of pulmonary embolism: Secondary | ICD-10-CM | POA: Diagnosis not present

## 2020-02-13 MED ORDER — SODIUM CHLORIDE 0.9% FLUSH
10.0000 mL | INTRAVENOUS | Status: DC | PRN
  Administered 2020-02-13: 10 mL
  Filled 2020-02-13: qty 10

## 2020-02-13 MED ORDER — HEPARIN SOD (PORK) LOCK FLUSH 100 UNIT/ML IV SOLN
500.0000 [IU] | Freq: Once | INTRAVENOUS | Status: AC | PRN
  Administered 2020-02-13: 500 [IU]
  Filled 2020-02-13: qty 5

## 2020-02-13 MED ORDER — SODIUM CHLORIDE 0.9 % IV SOLN
200.0000 mg | Freq: Once | INTRAVENOUS | Status: AC
  Administered 2020-02-13: 200 mg via INTRAVENOUS
  Filled 2020-02-13: qty 200

## 2020-02-13 NOTE — Progress Notes (Signed)
Pt discharged in no apparent distress. Pt left ambulatory without assistance. Pt aware of discharge instructions and verbalized understanding and had no further questions.  

## 2020-02-13 NOTE — Patient Instructions (Signed)

## 2020-02-16 ENCOUNTER — Inpatient Hospital Stay: Payer: Medicare HMO

## 2020-02-16 ENCOUNTER — Emergency Department (HOSPITAL_BASED_OUTPATIENT_CLINIC_OR_DEPARTMENT_OTHER): Payer: Medicare HMO

## 2020-02-16 ENCOUNTER — Encounter (HOSPITAL_BASED_OUTPATIENT_CLINIC_OR_DEPARTMENT_OTHER): Payer: Self-pay

## 2020-02-16 ENCOUNTER — Other Ambulatory Visit: Payer: Self-pay

## 2020-02-16 ENCOUNTER — Emergency Department (HOSPITAL_BASED_OUTPATIENT_CLINIC_OR_DEPARTMENT_OTHER)
Admission: EM | Admit: 2020-02-16 | Discharge: 2020-02-16 | Disposition: A | Discharge status: Home / Self Care | Payer: Medicare HMO | Attending: Emergency Medicine | Admitting: Emergency Medicine

## 2020-02-16 ENCOUNTER — Telehealth: Payer: Self-pay | Admitting: *Deleted

## 2020-02-16 DIAGNOSIS — Z79899 Other long term (current) drug therapy: Secondary | ICD-10-CM | POA: Insufficient documentation

## 2020-02-16 DIAGNOSIS — S93401A Sprain of unspecified ligament of right ankle, initial encounter: Secondary | ICD-10-CM | POA: Insufficient documentation

## 2020-02-16 DIAGNOSIS — J449 Chronic obstructive pulmonary disease, unspecified: Secondary | ICD-10-CM | POA: Diagnosis not present

## 2020-02-16 DIAGNOSIS — Z7982 Long term (current) use of aspirin: Secondary | ICD-10-CM | POA: Diagnosis not present

## 2020-02-16 DIAGNOSIS — S8391XA Sprain of unspecified site of right knee, initial encounter: Secondary | ICD-10-CM | POA: Insufficient documentation

## 2020-02-16 DIAGNOSIS — Z794 Long term (current) use of insulin: Secondary | ICD-10-CM | POA: Diagnosis not present

## 2020-02-16 DIAGNOSIS — S99911A Unspecified injury of right ankle, initial encounter: Secondary | ICD-10-CM | POA: Diagnosis present

## 2020-02-16 DIAGNOSIS — Z87891 Personal history of nicotine dependence: Secondary | ICD-10-CM | POA: Diagnosis not present

## 2020-02-16 DIAGNOSIS — E119 Type 2 diabetes mellitus without complications: Secondary | ICD-10-CM | POA: Insufficient documentation

## 2020-02-16 DIAGNOSIS — W1811XA Fall from or off toilet without subsequent striking against object, initial encounter: Secondary | ICD-10-CM | POA: Insufficient documentation

## 2020-02-16 DIAGNOSIS — Z7901 Long term (current) use of anticoagulants: Secondary | ICD-10-CM | POA: Diagnosis not present

## 2020-02-16 DIAGNOSIS — Y92009 Unspecified place in unspecified non-institutional (private) residence as the place of occurrence of the external cause: Secondary | ICD-10-CM | POA: Diagnosis not present

## 2020-02-16 DIAGNOSIS — I10 Essential (primary) hypertension: Secondary | ICD-10-CM | POA: Insufficient documentation

## 2020-02-16 DIAGNOSIS — M25571 Pain in right ankle and joints of right foot: Secondary | ICD-10-CM | POA: Diagnosis not present

## 2020-02-16 DIAGNOSIS — M25561 Pain in right knee: Secondary | ICD-10-CM | POA: Diagnosis not present

## 2020-02-16 MED ORDER — ACETAMINOPHEN 325 MG PO TABS
650.0000 mg | ORAL_TABLET | Freq: Once | ORAL | Status: AC
  Administered 2020-02-16: 650 mg via ORAL
  Filled 2020-02-16: qty 2

## 2020-02-16 NOTE — ED Notes (Signed)
Presents with family with c/o RLE pain, pt and family states she fell off the commode yesterday. "my leg bent backwards" upon exam pt states pain has a lot of pain from base of rt patella to top of rt foot, noted to be very tender in that area, has strong plantar and dorsal flexion. Pt states she took an Aleve but has not had any relief

## 2020-02-16 NOTE — ED Triage Notes (Signed)
Pt states she slid from bed yesterday-pain right LE "from my knee down"-pt with paralysis left UE/LE-was lifted from car by staff x 2 to w/c-NAD-to triage in w/c

## 2020-02-16 NOTE — ED Notes (Signed)
Pt has difficulty standing and transferring from wheelchair to stretcher, PT IS A HIGH FALL RISK, sr x 2 up, family at bedside, yellow socks on, call bell within reach, fall risk bracelet is on

## 2020-02-16 NOTE — ED Notes (Signed)
Patient transported to X-ray 

## 2020-02-16 NOTE — ED Notes (Signed)
PT family member wondering about Cam boot, MD informed

## 2020-02-16 NOTE — Telephone Encounter (Signed)
Call received from patient's daughter [name] stating that pt fell yesterday, is having pain to her legs, is having difficulty walking and would like to know if Dr. Marin Olp can order xrays on her today at her appt for iron infusion. Dr. Marin Olp notified and order received for pt to go to the ED now.  [name] states that will take pt to the [facility] ED now and is appreciative of assistance.

## 2020-02-16 NOTE — ED Notes (Signed)
ED Provider at bedside. 

## 2020-02-16 NOTE — ED Notes (Signed)
In to speak with EDP in regards to pt presentation

## 2020-02-16 NOTE — ED Provider Notes (Signed)
[facility] EMERGENCY DEPARTMENT Provider Note  CSN: [id number] Arrival date & time: 02/16/20 1400    History Chief Complaint  Patient presents with  . Leg Injury    HPI  Tonya [name] is a 79 y.o. female who reports she slid off her bedside commode at home yesterday. She reports her right leg was bent back underneath her. Family helped her back into bed, and she thought she would feel better today but was not able to stand and bear weight today. She reports pain in R knee and down to her R ankle. Denies any head injury or LOC. Otherwise she has been doing well. She has chronic L hemiparesis from prior stroke that limits her mobility some, uses a cane to get around.    Past Medical History:  Diagnosis Date  . Cerebrovascular accident [facility]) 2001   left side weakness  . Chronic edema    ? venous insufficiency  . COPD (chronic obstructive pulmonary disease) ([place])    never had a problem breathing  . Diabetes mellitus without complication ([place])   . Dyspnea   . Enlarged heart    per pt  . Gallstones   . GERD (gastroesophageal reflux disease)   . Goals of care, counseling/discussion 11/01/2018  . Hyperlipidemia   . Hypertension   . PONV (postoperative nausea and vomiting)    nausea     Past Surgical History:  Procedure Laterality Date  . ABDOMINAL HYSTERECTOMY    . BIOPSY  10/18/2018   Procedure: BIOPSY;  Surgeon: Jackquline Denmark, MD;  Location: [facility] ENDOSCOPY;  Service: Endoscopy;;  . CHOLECYSTECTOMY    . COLONOSCOPY WITH PROPOFOL N/A 10/18/2018   Procedure: COLONOSCOPY WITH PROPOFOL;  Surgeon: Jackquline Denmark, MD;  Location: [facility] ENDOSCOPY;  Service: Endoscopy;  Laterality: N/A;  . IR IMAGING GUIDED PORT INSERTION  11/11/2018  . IR IVC FILTER PLMT / S&I /IMG GUID/MOD SED  02/11/2020  . POLYPECTOMY  10/18/2018   Procedure: POLYPECTOMY;  Surgeon: Jackquline Denmark, MD;  Location: [facility] ENDOSCOPY;  Service: Endoscopy;;  . SUBMUCOSAL TATTOO INJECTION  10/18/2018   Procedure:  SUBMUCOSAL TATTOO INJECTION;  Surgeon: Jackquline Denmark, MD;  Location: [facility] ENDOSCOPY;  Service: Endoscopy;;    Family History  Problem Relation Age of Onset  . Cirrhosis Mother   . Heart disease Father   . Cancer Other   . Hypertension Other   . Diabetes Other     Social History   Tobacco Use  . Smoking status: Former Smoker    Packs/day: 0.25    Years: 1.00    Pack years: 0.25    Types: Cigarettes    Quit date: 07/23/1968    Years since quitting: 51.6  . Smokeless tobacco: Never Used  Vaping Use  . Vaping Use: Never used  Substance Use Topics  . Alcohol use: No    Alcohol/week: 0.0 standard drinks  . Drug use: No     Home Medications Prior to Admission medications   Medication Sig Start Date End Date Taking? Authorizing Provider  Alcohol Swabs (B-D SINGLE USE SWABS REGULAR) PADS 1 each by Does not apply route as needed (to cleanse site prior to obtaining droplet of blood for CBG's). E11.9 07/08/19   Renato Shin, MD  aspirin EC 81 MG tablet Take 81 mg by mouth daily.    [provider]  atorvastatin (LIPITOR) 10 MG tablet Take 1 tablet (10 mg total) by mouth daily. 09/01/19   Corum, Rex Kras, MD  B-D ULTRAFINE III SHORT  PEN 31G X 8 MM MISC  09/01/19   [provider]  Cholecalciferol 2000 units TABS Take 1 tablet (2,000 Units total) by mouth daily. 07/17/17   Janith Lima, MD  dexamethasone (DECADRON) 4 MG tablet Take 2 tablets (8 mg total) by mouth 2 (two) times daily. For four days following chemotherapy. 12/10/19   [name], MD  ELIQUIS 5 MG TABS tablet TAKE 1 TABLET(5 MG) BY MOUTH TWICE DAILY 11/21/19   [name], MD  famotidine (PEPCID) 40 MG tablet Take 1 tablet (40 mg total) by mouth 2 (two) times daily. 08/19/19   [name], MD  insulin degludec (TRESIBA FLEXTOUCH) 100 UNIT/ML FlexTouch Pen Inject 0.21 mLs (21 Units total) into the skin daily. Take 34 units on chemo days 09/19/19   Renato Shin, MD  ketoconazole (NIZORAL) 2 % cream  Apply 1 application topically 2 (two) times daily. 02/09/20   McDonald, Stephan Minister, DPM  lidocaine-prilocaine (EMLA) cream Apply 1 application topically as needed for up to 30 doses. 02/09/20   [name], MD  loperamide (IMODIUM A-D) 2 MG tablet Take 2 tablets (4 mg total) by mouth 4 (four) times daily as needed. Take 2 at diarrhea onset , then 1 every 2hr until 12hrs with no BM. May take 2 every 4hrs at night. If diarrhea recurs repeat. 01/20/19   [name], MD  losartan (COZAAR) 50 MG tablet Take 50 mg by mouth daily.  10/27/19   [provider]  magic mouthwash SOLN Swish and swallow equal parts Benadryl, Lidocaine and Nystatin.  5- 10 ml.'s four times a day as needed. 05/27/19   [name], MD  meclizine (ANTIVERT) 12.5 MG tablet TAKE 1 TABLET(12.5 MG) BY MOUTH THREE TIMES DAILY AS NEEDED FOR DIZZINESS 10/31/19   [place], Holli Humbles, NP  metoprolol tartrate (LOPRESSOR) 25 MG tablet Take 1 tablet (25 mg total) by mouth 2 (two) times daily. 09/01/19   Corum, Rex Kras, MD  olmesartan (BENICAR) 20 MG tablet Take 1 tablet (20 mg total) by mouth daily. 08/14/19   Corum, Rex Kras, MD  traMADol (ULTRAM) 50 MG tablet TAKE 1 TABLET(50 MG) BY MOUTH EVERY 6 HOURS AS NEEDED 02/09/20   [name], MD  TRUE METRIX BLOOD GLUCOSE TEST test strip TEST BLOOD SUGAR TWICE DAILY 01/16/20   Renato Shin, MD  TRUEplus Lancets 30G MISC 1 each by Does not apply route 2 (two) times daily. E11.9 07/08/19   Renato Shin, MD  vitamin B-12 (CYANOCOBALAMIN) 500 MCG tablet Take 500 mcg by mouth daily.    [provider]  prochlorperazine (COMPAZINE) 10 MG tablet Take 1 tablet (10 mg total) by mouth every 6 (six) hours as needed (Nausea or vomiting). 11/07/18 12/15/19  [name], MD     Allergies    Hydrocodone, Iohexol, Naproxen, Ibuprofen, and Propoxyphene n-acetaminophen   Review of Systems   Review of Systems A comprehensive review of systems was completed and negative except as noted in  HPI.    Physical Exam BP 109/69 (BP Location: Right Arm)   Pulse 94   Temp 98.3 F (36.8 C) (Oral)   Resp 18   Ht 5\' 11"  (1.803 m)   Wt 82.6 kg   SpO2 99%   BMI 25.38 kg/m   Physical Exam Vitals and nursing note reviewed.  Constitutional:      Appearance: Normal appearance.  HENT:     Head: Normocephalic and atraumatic.     Nose: Nose  normal.     Mouth/Throat:     Mouth: Mucous membranes are moist.  Eyes:     Extraocular Movements: Extraocular movements intact.     Conjunctiva/sclera: Conjunctivae normal.  Cardiovascular:     Rate and Rhythm: Normal rate.  Pulmonary:     Effort: Pulmonary effort is normal.     Breath sounds: Normal breath sounds.  Abdominal:     General: Abdomen is flat.     Palpations: Abdomen is soft.     Tenderness: There is no abdominal tenderness.  Musculoskeletal:        General: Tenderness (R medial knee and R ankle, no acute swelling or deformity, NVI) present. No swelling. Normal range of motion.     Cervical back: Neck supple.  Skin:    General: Skin is warm and dry.  Neurological:     General: No focal deficit present.     Mental Status: She is alert.  Psychiatric:        Mood and Affect: Mood normal.      ED Results / Procedures / Treatments   Labs (all labs ordered are listed, but only abnormal results are displayed) Labs Reviewed - No data to display  EKG None   Radiology DG Ankle Complete Right  Result Date: 02/16/2020 CLINICAL DATA:  Right ankle pain after fall yesterday. EXAM: RIGHT ANKLE - COMPLETE 3+ VIEW COMPARISON:  None. FINDINGS: There is no evidence of fracture, dislocation, or joint effusion. There is no evidence of arthropathy or other focal bone abnormality. Soft tissues are unremarkable. IMPRESSION: Negative. Electronically Signed   By: Marijo Conception M.D.   On: 02/16/2020 16:46   DG Knee Complete 4 Views Right  Result Date: 02/16/2020 CLINICAL DATA:  Right knee pain after fall yesterday. EXAM: RIGHT  KNEE - COMPLETE 4+ VIEW COMPARISON:  None. FINDINGS: No evidence of fracture, dislocation, or joint effusion. No significant joint space narrowing is noted. Chondrocalcinosis is noted medially laterally suggesting calcium pyrophosphate disease or early degenerative joint disease. Soft tissues are unremarkable. IMPRESSION: Chondrocalcinosis is noted medially suggesting calcium pyrophosphate disease or early degenerative joint disease. No acute abnormality seen in the right knee. Electronically Signed   By: Marijo Conception M.D.   On: 02/16/2020 16:45    Procedures Procedures  Medications Ordered in the ED Medications  acetaminophen (TYLENOL) tablet 650 mg (650 mg Oral Given 02/16/20 1614)     MDM Rules/Calculators/A&P MDM Patient with mechanical fall, here with RLE pain. Plan xrays. APAP for pain.  ED Course  I have reviewed the triage vital signs and the nursing notes.  Pertinent labs & imaging results that were available during my care of the patient were reviewed by me and considered in my medical decision making (see chart for details).  Clinical Course as of Feb 15 1742  Mon Feb 16, 2020  1728 Reviewed images with patient and family at bedside, no acute fractures, likely has sprain. Will try ASO/Ace wrap and if able to stand and bear weight in order to transfer to/from wheelchair at home will plan discharge.    [CS]  1741 Patient able to ambulate to the bathroom.    [CS]    Clinical Course User Index [CS] Truddie Hidden, MD    Final Clinical Impression(s) / ED Diagnoses Final diagnoses:  Sprain of right ankle, unspecified ligament, initial encounter  Sprain of right knee, unspecified ligament, initial encounter    Rx / DC Orders ED Discharge Orders    None  Truddie Hidden, MD 02/16/20 [id number]

## 2020-02-17 ENCOUNTER — Other Ambulatory Visit (HOSPITAL_COMMUNITY): Payer: Medicare HMO

## 2020-02-18 ENCOUNTER — Telehealth: Payer: Self-pay | Admitting: *Deleted

## 2020-02-18 NOTE — Telephone Encounter (Signed)
Received a call from [place] patients daughter stating that she has had incredible pain in her knee and ankle from the sprain incurred a week ago where she ended up in ED.  States that they wont be able to come in for iron infusion unless stronger medication given.  States that strong medication given in ED was very effective.  After looking in chart, the medication given was Tylenol.  Left message on patients VM to call back regarding her phone call and medication.

## 2020-02-19 ENCOUNTER — Ambulatory Visit: Payer: Medicare HMO

## 2020-02-19 ENCOUNTER — Inpatient Hospital Stay: Payer: Medicare HMO

## 2020-02-19 DIAGNOSIS — S93401A Sprain of unspecified ligament of right ankle, initial encounter: Secondary | ICD-10-CM | POA: Diagnosis not present

## 2020-02-19 DIAGNOSIS — I82502 Chronic embolism and thrombosis of unspecified deep veins of left lower extremity: Secondary | ICD-10-CM | POA: Diagnosis not present

## 2020-02-19 DIAGNOSIS — R6889 Other general symptoms and signs: Secondary | ICD-10-CM | POA: Diagnosis not present

## 2020-02-19 DIAGNOSIS — C189 Malignant neoplasm of colon, unspecified: Secondary | ICD-10-CM | POA: Diagnosis not present

## 2020-02-19 DIAGNOSIS — D6859 Other primary thrombophilia: Secondary | ICD-10-CM | POA: Diagnosis not present

## 2020-02-19 DIAGNOSIS — E1169 Type 2 diabetes mellitus with other specified complication: Secondary | ICD-10-CM | POA: Diagnosis not present

## 2020-02-19 DIAGNOSIS — M79661 Pain in right lower leg: Secondary | ICD-10-CM | POA: Diagnosis not present

## 2020-02-19 DIAGNOSIS — S82491A Other fracture of shaft of right fibula, initial encounter for closed fracture: Secondary | ICD-10-CM | POA: Diagnosis not present

## 2020-02-19 DIAGNOSIS — I10 Essential (primary) hypertension: Secondary | ICD-10-CM | POA: Diagnosis not present

## 2020-02-19 DIAGNOSIS — D500 Iron deficiency anemia secondary to blood loss (chronic): Secondary | ICD-10-CM | POA: Diagnosis not present

## 2020-02-19 DIAGNOSIS — I69354 Hemiplegia and hemiparesis following cerebral infarction affecting left non-dominant side: Secondary | ICD-10-CM | POA: Diagnosis not present

## 2020-02-19 DIAGNOSIS — S93491D Sprain of other ligament of right ankle, subsequent encounter: Secondary | ICD-10-CM | POA: Diagnosis not present

## 2020-02-20 ENCOUNTER — Other Ambulatory Visit: Payer: Self-pay | Admitting: Hematology & Oncology

## 2020-02-26 ENCOUNTER — Inpatient Hospital Stay: Payer: Medicare HMO

## 2020-02-26 ENCOUNTER — Inpatient Hospital Stay (HOSPITAL_BASED_OUTPATIENT_CLINIC_OR_DEPARTMENT_OTHER): Payer: Medicare HMO | Admitting: Hematology & Oncology

## 2020-02-26 ENCOUNTER — Inpatient Hospital Stay: Payer: Medicare HMO | Attending: Hematology & Oncology

## 2020-02-26 ENCOUNTER — Other Ambulatory Visit: Payer: Self-pay

## 2020-02-26 ENCOUNTER — Encounter: Payer: Self-pay | Admitting: Hematology & Oncology

## 2020-02-26 VITALS — BP 130/52 | HR 79 | Resp 18

## 2020-02-26 VITALS — BP 125/63 | HR 79 | Temp 97.7°F | Resp 18

## 2020-02-26 DIAGNOSIS — C189 Malignant neoplasm of colon, unspecified: Secondary | ICD-10-CM

## 2020-02-26 DIAGNOSIS — C787 Secondary malignant neoplasm of liver and intrahepatic bile duct: Secondary | ICD-10-CM | POA: Diagnosis not present

## 2020-02-26 DIAGNOSIS — Z86718 Personal history of other venous thrombosis and embolism: Secondary | ICD-10-CM | POA: Diagnosis not present

## 2020-02-26 DIAGNOSIS — Z7901 Long term (current) use of anticoagulants: Secondary | ICD-10-CM | POA: Diagnosis not present

## 2020-02-26 DIAGNOSIS — Z7982 Long term (current) use of aspirin: Secondary | ICD-10-CM | POA: Diagnosis not present

## 2020-02-26 DIAGNOSIS — D500 Iron deficiency anemia secondary to blood loss (chronic): Secondary | ICD-10-CM

## 2020-02-26 DIAGNOSIS — M25572 Pain in left ankle and joints of left foot: Secondary | ICD-10-CM | POA: Diagnosis not present

## 2020-02-26 DIAGNOSIS — C182 Malignant neoplasm of ascending colon: Secondary | ICD-10-CM | POA: Insufficient documentation

## 2020-02-26 DIAGNOSIS — Z79899 Other long term (current) drug therapy: Secondary | ICD-10-CM | POA: Diagnosis not present

## 2020-02-26 DIAGNOSIS — Z86711 Personal history of pulmonary embolism: Secondary | ICD-10-CM | POA: Diagnosis not present

## 2020-02-26 DIAGNOSIS — D509 Iron deficiency anemia, unspecified: Secondary | ICD-10-CM | POA: Diagnosis not present

## 2020-02-26 DIAGNOSIS — S93401A Sprain of unspecified ligament of right ankle, initial encounter: Secondary | ICD-10-CM | POA: Diagnosis not present

## 2020-02-26 DIAGNOSIS — S82491A Other fracture of shaft of right fibula, initial encounter for closed fracture: Secondary | ICD-10-CM | POA: Diagnosis not present

## 2020-02-26 DIAGNOSIS — M25571 Pain in right ankle and joints of right foot: Secondary | ICD-10-CM | POA: Diagnosis not present

## 2020-02-26 DIAGNOSIS — R6889 Other general symptoms and signs: Secondary | ICD-10-CM | POA: Diagnosis not present

## 2020-02-26 DIAGNOSIS — Z95828 Presence of other vascular implants and grafts: Secondary | ICD-10-CM

## 2020-02-26 LAB — RETICULOCYTES
Immature Retic Fract: 25.5 % — ABNORMAL HIGH (ref 2.3–15.9)
RBC.: 3.67 MIL/uL — ABNORMAL LOW (ref 3.87–5.11)
Retic Count, Absolute: 81.5 10*3/uL (ref 19.0–186.0)
Retic Ct Pct: 2.2 % (ref 0.4–3.1)

## 2020-02-26 LAB — CMP (CANCER CENTER ONLY)
ALT: 14 U/L (ref 0–44)
AST: 19 U/L (ref 15–41)
Albumin: 3.2 g/dL — ABNORMAL LOW (ref 3.5–5.0)
Alkaline Phosphatase: 91 U/L (ref 38–126)
Anion gap: 7 (ref 5–15)
BUN: 21 mg/dL (ref 8–23)
CO2: 27 mmol/L (ref 22–32)
Calcium: 9.5 mg/dL (ref 8.9–10.3)
Chloride: 108 mmol/L (ref 98–111)
Creatinine: 0.64 mg/dL (ref 0.44–1.00)
GFR, Estimated: >60 mL/min (ref >60)
Glucose, Bld: 127 mg/dL — ABNORMAL HIGH (ref 70–99)
Potassium: 4.1 mmol/L (ref 3.5–5.1)
Sodium: 142 mmol/L (ref 135–145)
Total Bilirubin: 0.4 mg/dL (ref 0.3–1.2)
Total Protein: 5.7 g/dL — ABNORMAL LOW (ref 6.5–8.1)

## 2020-02-26 LAB — CBC WITH DIFFERENTIAL (CANCER CENTER ONLY)
Abs Immature Granulocytes: 0.03 10*3/uL (ref 0.00–0.07)
Basophils Absolute: 0 10*3/uL (ref 0.0–0.1)
Basophils Relative: 0 %
Eosinophils Absolute: 0.1 10*3/uL (ref 0.0–0.5)
Eosinophils Relative: 2 %
HCT: 33.7 % — ABNORMAL LOW (ref 36.0–46.0)
Hemoglobin: 10.3 g/dL — ABNORMAL LOW (ref 12.0–15.0)
Immature Granulocytes: 0 %
Lymphocytes Relative: 20 %
Lymphs Abs: 1.5 10*3/uL (ref 0.7–4.0)
MCH: 27.5 pg (ref 26.0–34.0)
MCHC: 30.6 g/dL (ref 30.0–36.0)
MCV: 90.1 fL (ref 80.0–100.0)
Monocytes Absolute: 0.4 10*3/uL (ref 0.1–1.0)
Monocytes Relative: 5 %
Neutro Abs: 5.5 10*3/uL (ref 1.7–7.7)
Neutrophils Relative %: 73 %
Platelet Count: 389 10*3/uL (ref 150–400)
RBC: 3.74 MIL/uL — ABNORMAL LOW (ref 3.87–5.11)
RDW: 17.1 % — ABNORMAL HIGH (ref 11.5–15.5)
WBC Count: 7.5 10*3/uL (ref 4.0–10.5)
nRBC: 0 % (ref 0.0–0.2)

## 2020-02-26 LAB — SAMPLE TO BLOOD BANK

## 2020-02-26 MED ORDER — HEPARIN SOD (PORK) LOCK FLUSH 100 UNIT/ML IV SOLN
250.0000 [IU] | Freq: Once | INTRAVENOUS | Status: AC | PRN
  Administered 2020-02-26: 250 [IU]
  Filled 2020-02-26: qty 5

## 2020-02-26 MED ORDER — SODIUM CHLORIDE 0.9 % IV SOLN
INTRAVENOUS | Status: DC
  Filled 2020-02-26: qty 250

## 2020-02-26 MED ORDER — SODIUM CHLORIDE 0.9% FLUSH
3.0000 mL | Freq: Once | INTRAVENOUS | Status: AC | PRN
  Administered 2020-02-26: 3 mL
  Filled 2020-02-26: qty 10

## 2020-02-26 MED ORDER — SODIUM CHLORIDE 0.9 % IV SOLN
200.0000 mg | Freq: Once | INTRAVENOUS | Status: AC
  Administered 2020-02-26: 200 mg via INTRAVENOUS
  Filled 2020-02-26: qty 200

## 2020-02-26 NOTE — Progress Notes (Signed)
Hematology and Oncology Follow Up Visit  BRITNI DRISCOLL [id number] Mar 11, 1941 79 y.o. 02/26/2020   Principle Diagnosis:  Stage IV adenocarcinoma of the ascending colon -- NRAS+, BRAF-, HER2-, MSI/MMR -,  Iron deficiency anemia DVT in LEFT leg-- Peroneal and popliteal vein  Past Therapy: FOLFOX - s/p cycle5  Current Therapy: FOLFIRI/Bevacizumab -started 01/20/2019,s/p cycle #16 -- Avastin d/c'ed on 10/21/2019 due to DVT IV Iron as indicated Eliquis 5 mg po BID -- started on 09/30/2019   Interim History:  Ms. Alfredo is here today with her daughter for follow-up.  She is supposed to have surgery to resect out the primary on November 22.  Unfortunately, we now have a problem.  She fell and it looks like she suffered a fracture of the tibia.  Again we do not have these x-rays.  There were done at the local orthopedic.  This is of the right leg.  I just am not sure how this is going to factor having surgery.  I realize that she was a somewhat tenuous case for surgery given her other health issues.  Now that she has this fracture, I am not sure if Dr. Lucia Gaskins will operate on her.  I do not think she is bleeding.  Her hemoglobin is relatively stable.  I realize that she had been bleeding from the colonic mass in the past.  Her appetite is down a little bit.  I am encouraging her to try to eat a little bit more.  Her daughter is very helpful.  I think if we gave her some supplements this might help her nutritional state.  She has had no problems with the Eliquis.  She is on Eliquis.  I think she did have a temporary IVC filter placed in anticipation of surgery.  She has had no cough.  Her last CEA level was 8.1.  Currently, I would say performance status is probably ECOG 1-2.    Medications:  Allergies as of 02/26/2020      Reactions   Hydrocodone Swelling, Other (See Comments)   "I started swelling, became red, and passed out"   Iohexol Hives, Shortness Of Breath, Other (See  Comments)   Patient developed hives and fullness in throat post injection of 125cc's Omni 300, Onset Date: 11/15/2006   Naproxen Other (See Comments)   "It made me feel out of my head"   Ibuprofen Nausea Only   Propoxyphene N-acetaminophen Other (See Comments)   "I couldn't find the door to make my way out of the room- I was in misery"      Medication List       Accurate as of February 26, 2020 12:11 PM. If you have any questions, ask your nurse or doctor.        STOP taking these medications   Cholecalciferol 50 MCG (2000 UT) Tabs Stopped by: [name], MD     TAKE these medications   aspirin EC 81 MG tablet Take 81 mg by mouth daily.   atorvastatin 10 MG tablet Commonly known as: LIPITOR Take 1 tablet (10 mg total) by mouth daily.   B-D SINGLE USE SWABS REGULAR Pads 1 each by Does not apply route as needed (to cleanse site prior to obtaining droplet of blood for CBG's). E11.9   B-D ULTRAFINE III SHORT PEN 31G X 8 MM Misc Generic drug: Insulin Pen Needle   dexamethasone 4 MG tablet Commonly known as: DECADRON Take 2 tablets (8 mg total) by mouth 2 (two) times daily. For  four days following chemotherapy.   Eliquis 5 MG Tabs tablet Generic drug: apixaban TAKE 1 TABLET(5 MG) BY MOUTH TWICE DAILY   famotidine 40 MG tablet Commonly known as: Pepcid Take 1 tablet (40 mg total) by mouth 2 (two) times daily.   ketoconazole 2 % cream Commonly known as: NIZORAL Apply 1 application topically 2 (two) times daily.   lidocaine-prilocaine cream Commonly known as: EMLA Apply 1 application topically as needed for up to 30 doses.   loperamide 2 MG tablet Commonly known as: Imodium A-D Take 2 tablets (4 mg total) by mouth 4 (four) times daily as needed. Take 2 at diarrhea onset , then 1 every 2hr until 12hrs with no BM. May take 2 every 4hrs at night. If diarrhea recurs repeat.   losartan 50 MG tablet Commonly known as: COZAAR Take 50 mg by mouth daily.   magic  mouthwash Soln Swish and swallow equal parts Benadryl, Lidocaine and Nystatin.  5- 10 ml.'s four times a day as needed.   meclizine 12.5 MG tablet Commonly known as: ANTIVERT TAKE 1 TABLET(12.5 MG) BY MOUTH THREE TIMES DAILY AS NEEDED FOR DIZZINESS   metoprolol tartrate 25 MG tablet Commonly known as: LOPRESSOR Take 1 tablet (25 mg total) by mouth 2 (two) times daily.   metroNIDAZOLE 500 MG tablet Commonly known as: FLAGYL Take 500 mg by mouth as needed.   neomycin 500 MG tablet Commonly known as: MYCIFRADIN Take 1,000 mg by mouth 3 (three) times daily.   olmesartan 20 MG tablet Commonly known as: BENICAR Take 1 tablet (20 mg total) by mouth daily.   terbinafine 1 % cream Commonly known as: LAMISIL Apply topically 2 (two) times daily. PRN   traMADol 50 MG tablet Commonly known as: ULTRAM TAKE 1 TABLET(50 MG) BY MOUTH EVERY 6 HOURS AS NEEDED   Tresiba FlexTouch 100 UNIT/ML FlexTouch Pen Generic drug: insulin degludec Inject 0.21 mLs (21 Units total) into the skin daily. Take 34 units on chemo days   True Metrix Blood Glucose Test test strip Generic drug: glucose blood TEST BLOOD SUGAR TWICE DAILY   TRUEplus Lancets 30G Misc 1 each by Does not apply route 2 (two) times daily. E11.9   vitamin B-12 500 MCG tablet Commonly known as: CYANOCOBALAMIN Take 500 mcg by mouth daily.       Allergies:  Allergies  Allergen Reactions  . Hydrocodone Swelling and Other (See Comments)    "I started swelling, became red, and passed out"  . Iohexol Hives, Shortness Of Breath and Other (See Comments)    Patient developed hives and fullness in throat post injection of 125cc's Omni 300, Onset Date: 11/15/2006   . Naproxen Other (See Comments)    "It made me feel out of my head"  . Ibuprofen Nausea Only  . Propoxyphene N-Acetaminophen Other (See Comments)    "I couldn't find the door to make my way out of the room- I was in misery"    Past Medical History, Surgical history,  Social history, and Family History were reviewed and updated.  Review of Systems: Review of Systems  Constitutional: Negative.   HENT: Negative.   Eyes: Negative.   Respiratory: Negative.   Cardiovascular: Negative.   Gastrointestinal: Negative.   Genitourinary: Negative.   Musculoskeletal: Negative.   Skin: Negative.   Neurological: Negative.   Endo/Heme/Allergies: Negative.   Psychiatric/Behavioral: Negative.      Physical Exam:  oral temperature is 97.7 F (36.5 C). Her blood pressure is 125/63 and her pulse is  79. Her respiration is 18 and oxygen saturation is 100%.   Wt Readings from Last 3 Encounters:  02/16/20 182 lb (82.6 kg)  01/29/20 187 lb 12.8 oz (85.2 kg)  12/15/19 184 lb (83.5 kg)    Physical Exam Vitals reviewed.  HENT:     Head: Normocephalic and atraumatic.  Eyes:     Pupils: Pupils are equal, round, and reactive to light.  Cardiovascular:     Rate and Rhythm: Normal rate and regular rhythm.     Heart sounds: Normal heart sounds.  Pulmonary:     Effort: Pulmonary effort is normal.     Breath sounds: Normal breath sounds.  Abdominal:     General: Bowel sounds are normal.     Palpations: Abdomen is soft.  Musculoskeletal:        General: No tenderness or deformity. Normal range of motion.     Cervical back: Normal range of motion.     Comments: She has a brace on the right lower leg.  Lymphadenopathy:     Cervical: No cervical adenopathy.  Skin:    General: Skin is warm and dry.     Findings: No erythema or rash.  Neurological:     Mental Status: She is alert and oriented to person, place, and time.     Comments: She has some chronic weakness over on the left side from past stroke.  Psychiatric:        Behavior: Behavior normal.        Thought Content: Thought content normal.        Judgment: Judgment normal.      Lab Results  Component Value Date   WBC 7.5 02/26/2020   HGB 10.3 (L) 02/26/2020   HCT 33.7 (L) 02/26/2020   MCV 90.1  02/26/2020   PLT 389 02/26/2020   Lab Results  Component Value Date   FERRITIN 580 (H) 01/28/2020   IRON 36 (L) 01/28/2020   TIBC 226 (L) 01/28/2020   UIBC 190 01/28/2020   IRONPCTSAT 16 (L) 01/28/2020   Lab Results  Component Value Date   RETICCTPCT 2.2 02/26/2020   RBC 3.67 (L) 02/26/2020   RETICCTABS 62,400 05/17/2016   No results found for: KPAFRELGTCHN, LAMBDASER, KAPLAMBRATIO No results found for: IGGSERUM, IGA, IGMSERUM No results found for: Odetta Pink, SPEI   Chemistry      Component Value Date/Time   NA 142 02/26/2020 1044   NA 139 09/30/2018 1311   K 4.1 02/26/2020 1044   CL 108 02/26/2020 1044   CO2 27 02/26/2020 1044   BUN 21 02/26/2020 1044   BUN 10 09/30/2018 1311   CREATININE 0.64 02/26/2020 1044      Component Value Date/Time   CALCIUM 9.5 02/26/2020 1044   ALKPHOS 91 02/26/2020 1044   AST 19 02/26/2020 1044   ALT 14 02/26/2020 1044   BILITOT 0.4 02/26/2020 1044       Impression and Plan: Ms. Garbutt is a very pleasant 79 yo African American female with metastatic colon cancer.  Again, she does have progressive disease.  She has done quite well with the FOLFIRI regimen.  As such, I do think that if she is able to have surgery, she would be a good candidate for surgical resection of the primary.  We will have to speak with Dr. Lucia Gaskins.  Again he may not feel comfortable operating on her at this point.  If that is the case, then we are going to  have to see about radiation therapy to the primary site.  I do think this would be reasonable.  She will need iron today.  She does have iron deficiency.  I will likely plan to see her back in another month.  Again if she does not have surgery, then we probably will have to rescan her to see exactly where things stand with respect to her malignancy.  I realize this is very complicated.  She does have underlying health issues.  She has had the past CVA.  She  now has this broken right tibia.  [name], MD 11/4/202112:11 PM

## 2020-02-26 NOTE — Patient Instructions (Signed)

## 2020-02-26 NOTE — Patient Outreach (Signed)
[facility] Care Management  02/26/2020  Tonya [name] [birth year] [id number]   Referral Date: 02/25/20 Referral Source: [employer] Advice line Referral Reason: In home care resources  Outreach Attempt: spoke with daughter Tonya [name] who has been patient caregiver along with her brother.  She reports however he is a Administrator. She states patient is now total care after broken leg.  She is looking for in home care for patient as she sometimes cannot provide the care that patient needs alone.  Discussed care options and medicaid.  Daughter reports that patient income too much to qualify for medicaid.     Discussed [facility] services and support.  Daughter agreeable to social work referral and CM Dealer for in home care.  Discussed ongoing RN CM support and education.  Daughter declines at this time.    Social: Patient lives in the home with son who is a Administrator.  Patient is total dependent upon family to provide care at this time.     Conditions: Patient has history of stroke that caused left sided weakness.  Patient also has DM, HTN and stage 4 colon cancer. Patient has recent broken right leg that has caused patient to be dependent for all care at this point.  Patient currently in treatment for stage 4 cancer and scheduled for surgery later this month.  Medications: Patient takes medications as prescribed and daughter offers no concerns.    Appointments: Patient see doctors regularly. Saw PCP about 2 weeks ago per daughter Tonya [name].     Advanced Directives: Family working on advanced directives currently.     Plan: RN CM will send in home care resource listing. RN CM will refer to social work. RN CM will close CM portion of case.     Jone Baseman, RN, MSN [facility] Care Management Care Management Coordinator Direct Line [phone] Toll Free: [phone]  Fax: [phone]

## 2020-02-26 NOTE — Patient Instructions (Signed)

## 2020-02-26 NOTE — Progress Notes (Signed)
Patient declined to stay for the post infusion observation period. Patient denies any difficulty with this infusion in the past and is aware to call with any questions or concerns.   Pt verbalized understanding and had no further questions today and discharged stable via wheelchair

## 2020-02-27 ENCOUNTER — Other Ambulatory Visit: Payer: Self-pay | Admitting: *Deleted

## 2020-02-27 ENCOUNTER — Encounter: Payer: Self-pay | Admitting: *Deleted

## 2020-02-27 ENCOUNTER — Other Ambulatory Visit: Payer: Self-pay

## 2020-02-27 LAB — IRON AND TIBC
Iron: 33 ug/dL — ABNORMAL LOW (ref 41–142)
Saturation Ratios: 17 % — ABNORMAL LOW (ref 21–57)
TIBC: 190 ug/dL — ABNORMAL LOW (ref 236–444)
UIBC: 157 ug/dL (ref 120–384)

## 2020-02-27 LAB — CEA (IN HOUSE-CHCC): CEA (CHCC-In House): 6.04 ng/mL — ABNORMAL HIGH (ref 0.00–5.00)

## 2020-02-27 LAB — FERRITIN: Ferritin: 1068 ng/mL — ABNORMAL HIGH (ref 11–307)

## 2020-02-27 NOTE — Patient Outreach (Signed)
[facility]) Care Management  02/27/2020  Tonya [name] [birth year] [id number]   Telephone call to daughter Tiawanna Luchsinger for follow up from social work call.  She states she is just concerned about patient care needs. She states that patient's PCP is working on Hospital bed and [place].  They have had many denials for service at this point but MD office still trying to get hospital bed and home health for patient. Discussed an IT trainer wheelchair.  She states she has been to many second hand stores looking for one. She was not sure if [facility] would pay for a gently used one. Advised typically that used electric chairs families find and pay for them on their own and that getting a new one ordered takes approval from physician and recommendations from physical therapy which can be a lengthy process.  She verbalized understanding.  She expresses that her Mom immobility makes her care cumbersome and she just needs some help but is willing to do what she has to do for her mother.    Discussed patient visit with Dr. Marin Olp on yesterday. She states her mother may not be able to have surgery due to broken leg but Dr. Marin Olp to discuss with Dr. Lucia Gaskins.  She is hopeful for patient positive outcome with all patient conditions at this point. Daughter waiting for input from Dr. Lucia Gaskins in regards to surgery or next steps for treatment of patient cancer.    Spoke with [place] about patient case. Advised of above. She will do [facility] referral for home modifications as patient is not eligible for [facility] due to demographics.   Plan: RN CM will touch base with daughter on Monday.    Jone Baseman, RN, MSN [facility] Care Management Coordinator Direct Line [phone] Cell [phone] Toll Free: [phone]  Fax: [phone]

## 2020-02-27 NOTE — Patient Outreach (Signed)
[facility]) Care Management  02/27/2020  Tonya [name] December 17, 1940 [id number]  CSW was able to make initial contact with patient's daughter, [name] today, to perform the phone assessment on patient, as well as assess and assist with social work needs and services.  CSW introduced self, explained role and types of services provided through [facility] ([facility]).  CSW further explained to [name] that CSW works with patient's Telephonic RNCM, also with [facility], Jon Billings.  CSW then explained the reason for the call, indicating that Mrs. Tonya [name] thought that patient would benefit from social work services and resources to assist with arranging in-home care services.  CSW obtained two HIPAA compliant identifiers from [name], which included patient's name and date of birth.  [name] admitted that she and her brother could really use some help in the home with patient, especially now that she has a broken leg and is unable to perform any of her daily living activities independently.  [name] went on to explain that she stays with her mother 7 days per week, from 11:00AM until 7:00PM, while her brother works as a Administrator, then he is available to patient 7 days per week, from 7:00PM until 3:00AM.  CSW explained to [name], that unless patient is a recipient of Adult Medicaid, through the [place], then she is not eligible to apply for [employer]) or [employer] ([place]), as both programs are only Medicaid funded.  Ms. Hugh voiced understanding, but admitted that patient is not eligible to receive Adult Medicaid, as patient has already tried applying several times in the past.  With that being said, CSW further explained to [name] that any in-home care services arranged for patient would be strictly private pay.  [name] is aware that  Mrs. Tonya [name] has already mailed her a complete list of in-home care services and private agency sitters.  [name] indicated that she is really only looking for someone to come in and help prepare meals, perform light-housekeeping duties, assist patient with bathing and dressing, as she admitted to having herniated and bulging discs in her back, preventing her from being able to lift patient, bend over, push or pull, without experiencing excruciating pain.  [name] reported that a hospital bed and an electric wheelchair have been ordered for patient; however, [employer], patient's insurance provider, is denying both claims.  Patient's Primary Care Physician, Dr. Leeroy Cha is currently trying to pursue other avenues, but [name] realizes that obtaining the hospital bed and electric wheelchair can be a lengthy process.  [name] further reported that she needs to have a wheelchair ramp installed at patient's home, as patient is currently bed/wheelchair bound.  CSW agreed to place a referral for financial assistance and wheelchair ramp installation through the [employer], obtaining verbal consent from Ms. Schirm, prior to doing so.  Patient has been approved for home health services.  CSW explained to [name] that [facility] [place] will email ([email or web address]) her a list of all community agencies and resources that may be beneficial to her while caring for patient in the home.  [name] was adamant about not wanting to pursue placement for patient, of any kind, into a long-term care facility.  CSW agreed to follow-up with [name] again next week, on Thursday, March 04, 2020, around 11:00 AM.  CSW was able to confirm that [name]  has the correct contact information for CSW, encouraging her to contact CSW directly if she has questions about any of the resources that [facility] [facility] emailed to her, or if additional social work needs arise in the meantime.  [name] was agreeable to this plan and  very appreciative of the call.  [name], BSW, MSW, LCSW  Licensed Education officer, environmental [facility]  Mailing [place] [address] [place], [place] [address] Physical Address-[address], [place], [place] [address] Toll Free Main # [phone] Fax # [phone] Cell # [phone]  [name]

## 2020-03-01 ENCOUNTER — Other Ambulatory Visit: Payer: Self-pay

## 2020-03-01 NOTE — Patient Outreach (Signed)
[facility]) Care Management  03/01/2020  CARRIEANNE KLEEN 1941-02-06 [id number]   Telephone call to daughter [name]. She reports that her mom is doing ok.  She reports that home health has contacted but not sure of agency but has it written down.  She states also she will be following up with PCP office about hospital bed status with the agency.  She voices no questions or concerns.    Plan: RN CM will close RN CM portion of case.    Jone Baseman, RN, MSN [facility] Care Management Coordinator Direct Line [phone] Cell [phone] Toll Free: [phone]  Fax: [phone]

## 2020-03-03 ENCOUNTER — Ambulatory Visit: Payer: Medicare HMO | Admitting: Internal Medicine

## 2020-03-04 ENCOUNTER — Other Ambulatory Visit: Payer: Self-pay | Admitting: *Deleted

## 2020-03-04 NOTE — Patient Outreach (Signed)
[facility]) Care Management  03/04/2020  Tonya [name] 01/10/1941 [id number]  CSW was able to make contact with patient's daughter, [name] today, to follow-up regarding social work services and resources for patient, as well as to ensure that she received the list of resources that CSW e-mailed to patient's e-mail address, per Ms. Loeffelholz's request.  Ms. Westry checked patient's e-mail account while conversing with CSW over the phone, but denied receiving anything from [facility].  Ms. Eads encouraged CSW to e-mail all of the resource information again, but this time to her own personal e-mail address ([email or web address]).  CSW agreed to e-mail the list of resources to Ms. Fournier today.  CSW was able to confirm with Ms. Darin that transportation arrangements have been made for patient through [facility], as this is a benefit to [employer] recipients.  Ms. Curnow admitted that she is still waiting to hear back from patient's Primary Care Physician, Dr. Leeroy Cha, or her nurse, regarding home health services for patient, in addition to the hospital bed that needs to be ordered for patient for home use.  CSW agreed to follow-up with Dr. Fara Olden, or her nurse, to check the status of this request.  CSW will also route this note to Dr. Fara Olden for her independent review.  Ms. Hollopeter denied receiving any calls regarding the referral that [facility] placed on patient's behalf, to try and obtain financial/labor assistance with installation of a wheelchair ramp, through the [employer].  CSW agreed to place another referral today, explaining to Ms. Hinz that there simply may not be any agencies in [facility] that are able to offer funding for this type of request.  Further, CSW reminded Ms. Hubbard that Dr. Fara Olden, and her staff, are working very diligently with [facility] Medicare to try and obtain approval  for home health services and durable medical equipment, but continue to receive denials.  CSW explained to Ms. Floor that [facility] will be participating in an audit two days next week, then off the entire week of Thanksgiving, agreeing to follow-up with Ms. Gillaspie as soon as CSW returns to the office.  Ms. Gerber voiced understanding and was agreeable to this plan, encouraging CSW to contact her again on Thursday, March 25, 2020, around 9:00 AM, exactly three weeks from today.  CSW was able to confirm that Ms. Skilling has the correct contact information for CSW, encouraging her to contact CSW directly if additional social work needs arise in the meantime.  Ms. Tedesco was very appreciative of the call and of CSW's willingness to offer assistance.  [name], BSW, MSW, LCSW  Licensed Education officer, environmental Health System  Mailing [place] [address], [place], [place] [address] Physical Address[address], [place], [place] [address] Toll Free Main # [phone] Fax # [phone] Cell # [phone]  [email or web address]

## 2020-03-09 ENCOUNTER — Telehealth: Payer: Self-pay

## 2020-03-09 ENCOUNTER — Encounter (HOSPITAL_COMMUNITY): Payer: Medicare HMO

## 2020-03-09 ENCOUNTER — Telehealth: Payer: Self-pay | Admitting: *Deleted

## 2020-03-09 NOTE — Telephone Encounter (Signed)
Call placed to patient's daughter, [name] to review updated schedule with her.  [name] is appreciative of call and has no questions at this time.

## 2020-03-09 NOTE — Telephone Encounter (Signed)
Patient's daughter, [name] notified that Dr. Marin Olp has spoken with Dr. Sondra Come this morning regarding radiation treatments and that scheduling will be calling pt for more information regarding radiation.  [name] questions if pt needs further iron infusion after last iron results from 02/26/20.  Dr. Marin Olp notified and order received for pt to get one dose of IV Venofer.  Message sent to scheduling.

## 2020-03-09 NOTE — Telephone Encounter (Signed)
Per inbasket message a flush and iv iron needed to be added, in order to accommodate pt and the added appts we moved it out to 12/16- vanessa aware.... AOm

## 2020-03-11 ENCOUNTER — Other Ambulatory Visit (HOSPITAL_COMMUNITY): Payer: Medicare HMO

## 2020-03-11 ENCOUNTER — Other Ambulatory Visit: Payer: Self-pay | Admitting: *Deleted

## 2020-03-11 DIAGNOSIS — M25572 Pain in left ankle and joints of left foot: Secondary | ICD-10-CM | POA: Diagnosis not present

## 2020-03-11 DIAGNOSIS — M25571 Pain in right ankle and joints of right foot: Secondary | ICD-10-CM | POA: Diagnosis not present

## 2020-03-11 DIAGNOSIS — R6889 Other general symptoms and signs: Secondary | ICD-10-CM | POA: Diagnosis not present

## 2020-03-11 DIAGNOSIS — S82491A Other fracture of shaft of right fibula, initial encounter for closed fracture: Secondary | ICD-10-CM | POA: Diagnosis not present

## 2020-03-11 DIAGNOSIS — I82402 Acute embolism and thrombosis of unspecified deep veins of left lower extremity: Secondary | ICD-10-CM

## 2020-03-11 MED ORDER — APIXABAN 5 MG PO TABS: ORAL_TABLET | ORAL | 3 refills | Status: DC

## 2020-03-15 ENCOUNTER — Inpatient Hospital Stay (HOSPITAL_COMMUNITY): Admission: RE | Admit: 2020-03-15 | Payer: Medicare HMO | Source: Home / Self Care | Admitting: Surgery

## 2020-03-15 ENCOUNTER — Encounter (HOSPITAL_COMMUNITY): Admission: RE | Payer: Self-pay | Source: Home / Self Care

## 2020-03-15 SURGERY — LAPAROSCOPIC RIGHT HEMI COLECTOMY
Anesthesia: General | Laterality: Right

## 2020-03-16 ENCOUNTER — Inpatient Hospital Stay: Payer: Medicare HMO

## 2020-03-17 ENCOUNTER — Other Ambulatory Visit: Payer: Self-pay | Admitting: Emergency Medicine

## 2020-03-22 ENCOUNTER — Other Ambulatory Visit: Payer: Self-pay

## 2020-03-22 ENCOUNTER — Inpatient Hospital Stay: Payer: Medicare HMO

## 2020-03-22 VITALS — BP 108/74 | HR 82 | Temp 98.1°F | Resp 17

## 2020-03-22 DIAGNOSIS — C182 Malignant neoplasm of ascending colon: Secondary | ICD-10-CM | POA: Diagnosis not present

## 2020-03-22 DIAGNOSIS — D509 Iron deficiency anemia, unspecified: Secondary | ICD-10-CM | POA: Diagnosis not present

## 2020-03-22 DIAGNOSIS — Z86711 Personal history of pulmonary embolism: Secondary | ICD-10-CM | POA: Diagnosis not present

## 2020-03-22 DIAGNOSIS — Z7982 Long term (current) use of aspirin: Secondary | ICD-10-CM | POA: Diagnosis not present

## 2020-03-22 DIAGNOSIS — D500 Iron deficiency anemia secondary to blood loss (chronic): Secondary | ICD-10-CM

## 2020-03-22 DIAGNOSIS — R6889 Other general symptoms and signs: Secondary | ICD-10-CM | POA: Diagnosis not present

## 2020-03-22 DIAGNOSIS — Z86718 Personal history of other venous thrombosis and embolism: Secondary | ICD-10-CM | POA: Diagnosis not present

## 2020-03-22 DIAGNOSIS — Z79899 Other long term (current) drug therapy: Secondary | ICD-10-CM | POA: Diagnosis not present

## 2020-03-22 DIAGNOSIS — Z7901 Long term (current) use of anticoagulants: Secondary | ICD-10-CM | POA: Diagnosis not present

## 2020-03-22 MED ORDER — SODIUM CHLORIDE 0.9 % IV SOLN
INTRAVENOUS | Status: DC
  Filled 2020-03-22: qty 250

## 2020-03-22 MED ORDER — SODIUM CHLORIDE 0.9 % IV SOLN
200.0000 mg | Freq: Once | INTRAVENOUS | Status: AC
  Administered 2020-03-22: 200 mg via INTRAVENOUS
  Filled 2020-03-22: qty 200

## 2020-03-22 MED ORDER — SODIUM CHLORIDE 0.9% FLUSH
10.0000 mL | INTRAVENOUS | Status: DC | PRN
  Administered 2020-03-22: 10 mL
  Filled 2020-03-22: qty 10

## 2020-03-22 MED ORDER — HEPARIN SOD (PORK) LOCK FLUSH 100 UNIT/ML IV SOLN
500.0000 [IU] | Freq: Once | INTRAVENOUS | Status: AC | PRN
  Administered 2020-03-22: 500 [IU]
  Filled 2020-03-22: qty 5

## 2020-03-22 NOTE — Patient Instructions (Signed)

## 2020-03-22 NOTE — Progress Notes (Signed)
Pt discharged in no apparent distress. Pt left ambulatory via wheelchair. Pt aware of discharge instructions and verbalized understanding and had no further questions.

## 2020-03-23 ENCOUNTER — Other Ambulatory Visit: Payer: Self-pay | Admitting: Endocrinology

## 2020-03-23 DIAGNOSIS — Z794 Long term (current) use of insulin: Secondary | ICD-10-CM

## 2020-03-23 DIAGNOSIS — E1149 Type 2 diabetes mellitus with other diabetic neurological complication: Secondary | ICD-10-CM

## 2020-03-24 ENCOUNTER — Encounter: Payer: Self-pay | Admitting: Radiation Oncology

## 2020-03-24 ENCOUNTER — Other Ambulatory Visit: Payer: Self-pay

## 2020-03-24 ENCOUNTER — Ambulatory Visit
Admission: RE | Admit: 2020-03-24 | Discharge: 2020-03-24 | Disposition: A | Discharge status: Home / Self Care | Payer: Medicare HMO | Source: Ambulatory Visit | Attending: Radiation Oncology | Admitting: Radiation Oncology

## 2020-03-24 VITALS — BP 121/67 | HR 85 | Temp 98.1°F | Resp 18

## 2020-03-24 DIAGNOSIS — C772 Secondary and unspecified malignant neoplasm of intra-abdominal lymph nodes: Secondary | ICD-10-CM | POA: Insufficient documentation

## 2020-03-24 DIAGNOSIS — I10 Essential (primary) hypertension: Secondary | ICD-10-CM | POA: Insufficient documentation

## 2020-03-24 DIAGNOSIS — E119 Type 2 diabetes mellitus without complications: Secondary | ICD-10-CM | POA: Diagnosis not present

## 2020-03-24 DIAGNOSIS — Z87891 Personal history of nicotine dependence: Secondary | ICD-10-CM | POA: Insufficient documentation

## 2020-03-24 DIAGNOSIS — Z8673 Personal history of transient ischemic attack (TIA), and cerebral infarction without residual deficits: Secondary | ICD-10-CM | POA: Diagnosis not present

## 2020-03-24 DIAGNOSIS — E785 Hyperlipidemia, unspecified: Secondary | ICD-10-CM | POA: Diagnosis not present

## 2020-03-24 DIAGNOSIS — C787 Secondary malignant neoplasm of liver and intrahepatic bile duct: Secondary | ICD-10-CM

## 2020-03-24 DIAGNOSIS — C182 Malignant neoplasm of ascending colon: Secondary | ICD-10-CM | POA: Diagnosis not present

## 2020-03-24 DIAGNOSIS — R160 Hepatomegaly, not elsewhere classified: Secondary | ICD-10-CM | POA: Diagnosis not present

## 2020-03-24 DIAGNOSIS — C189 Malignant neoplasm of colon, unspecified: Secondary | ICD-10-CM

## 2020-03-24 DIAGNOSIS — K219 Gastro-esophageal reflux disease without esophagitis: Secondary | ICD-10-CM | POA: Insufficient documentation

## 2020-03-24 DIAGNOSIS — Z9221 Personal history of antineoplastic chemotherapy: Secondary | ICD-10-CM | POA: Insufficient documentation

## 2020-03-24 DIAGNOSIS — Z79899 Other long term (current) drug therapy: Secondary | ICD-10-CM | POA: Diagnosis not present

## 2020-03-24 DIAGNOSIS — R6889 Other general symptoms and signs: Secondary | ICD-10-CM | POA: Diagnosis not present

## 2020-03-24 DIAGNOSIS — Z7982 Long term (current) use of aspirin: Secondary | ICD-10-CM | POA: Insufficient documentation

## 2020-03-24 DIAGNOSIS — J449 Chronic obstructive pulmonary disease, unspecified: Secondary | ICD-10-CM | POA: Diagnosis not present

## 2020-03-24 NOTE — Progress Notes (Signed)
GI Location of Tumor / Histology: Stage IV adenocarcinoma of the ascending colon (NRAS+, BRAF-, HER2-, MSI/MMR-)  Tonya [name] presented ~1.5 year ago with symptoms of: weight loss (~20 pounds) and feeling weaker. She ultimately went to the emergency room and late June 2020, and was found to be quite anemic.  Her hemoglobin was 6.6. MCV was 65. She underwent a CT scan of the abdomen and pelvis.  This showed that she had a large circumferential mass in the proximal cecum.  She has some enlarged lymph nodes.  She had a mass in the right lobe of the liver measuring 5.9 x 5 cm. She then underwent a colonoscopy.  This was done on 10/18/2018.  A mass was noted in the ascending colon.  This was biopsied.  The pathology report ([id number]) showed an adenocarcinoma  Biopsies revealed:  10/18/2018 Diagnosis 1. Colon, biopsy, Right Ascending - ADENOCARCINOMA. SEE NOTE 2. Rectum, polyp(s) - TUBULAR ADENOMA - NEGATIVE FOR HIGH-GRADE DYSPLASIA OR MALIGNANCY  Past/Anticipated interventions by surgeon, if any:  --To be determined Per Dr. Marin Olp: "I just am not sure how this is going to factor having surgery.  I realize that she was a somewhat tenuous case for surgery given her other health issues.  Now that she has this fracture, I am not sure if Dr. Lucia Gaskins will operate on her."  Past/Anticipated interventions by medical oncology, if any:  Under care of Dr. Burney Gauze: 02/26/2020 Past Therapy: --FOLFOX - s/p cycle5 Current Therapy: --FOLFIRI/Bevacizumab -started 01/20/2019,s/p cycle #16 -- Avastin d/c'ed on 10/21/2019 due to DVT (LEFT leg--Peroneal and popliteal vein) --IV Iron as indicated --Eliquis 5 mg po BID -- started on 09/30/2019 (IVC filter placed on 02/11/2020)  --She has done quite well with the FOLFIRI regimen.  As such, I do think that if she is able to have surgery, she would be a good candidate for surgical resection of the primary. --We will have to speak with Dr. Lucia Gaskins.   Again he may not feel comfortable operating on her at this point.  If that is the case, then we are going to have to see about radiation therapy to the primary site.  I do think this would be reasonable --I will likely plan to see her back in another month.  Again if she does not have surgery, then we probably will have to rescan her to see exactly where things stand with respect to her malignancy.  Weight changes, No  Bowel/Bladder complaints, if any: Patient states that it rotates from constipation to diarrhea.  Nausea / Vomiting, if any: None  Pain issues, if any:  Denies  Any blood per rectum:   Patient states that she has dark red blood in her stools sometime.  SAFETY ISSUES:  Prior radiation? no  Pacemaker/ICD? No  Possible current pregnancy? No--hysterectomy  Is the patient on methotrexate? No  Current Complaints/Details: She has some chronic weakness over on the left side from past stroke. Fell on 02/16/2020  Vitals:   03/24/20 1320  BP: 121/67  Pulse: 85  Resp: 18  Temp: 98.1 F (36.7 C)  SpO2: 100%

## 2020-03-24 NOTE — Progress Notes (Signed)
Radiation Oncology         [phone] ________________________________  Initial Outpatient Consultation  Name: Tonya [name] MRN: [id number]  Date: 03/24/2020  DOB: 11-Apr-1941  AS:[id number], Ronie Spies, MD  [name], MD   REFERRING PHYSICIAN: [name], MD  DIAGNOSIS: The encounter diagnosis was Colon cancer metastasized to liver [facility]).  Stage IV adenocarcinoma of the ascending colon, primary site now unresectable due to medical issues  HISTORY OF PRESENT ILLNESS::Tonya [name] is a 79 y.o. female who is seen as a courtesy of Dr. Marin Olp for an opinion concerning radiation therapy as part of management for her recently diagnosed colon cancer. Today, she is accompanied by her sister. The patient presented to the ED on 10/15/2018 for evaluation of low hemoglobin. She was admitted to the hospital and underwent a CT scan of the abdomen and pelvis the following day that showed a large, circumferential mass of the proximal cecum just above the ileocecal valve. There were also noted to be enlarged lymph nodes of the right mesocolon that measured up to 2.1 cm. Findings were consistent with primary colon malignancy. There was also a large, hypodense mass of the right lobe of the liver that measured at least 5.9 x 5.0 cm, which was new when compared to prior CT scan in 2008 and was concerning for metastatic lesion. CT scan of chest performed on 10/18/2018 showed no thoracic findings typical for metastatic colon cancer. However, there was an indeterminate subpleural left lower lobe density between the left hilum and descending aorta that would have reflected an ill-defined mass, postinflammatory scarring, or rounded atelectasis. Although it was considered unlikely to be related to the patient's colon cancer, primary lung cancer was questioned. A colonoscopy was performed that same day and revealed adenocarcinoma of the right ascending colon. The rectum was also biopsied but only showed  tubular adenoma without high-grade dysplasia or malignancy.  The patient began chemotherapy with FOLFIRI/Bevacizumab on 01/20/2019. She completed 16 cycles under the care of Dr. Marin Olp.  Restaging CT scan of abdomen and pelvis on 01/09/2019 showed the large ascending colon mass with mild worsening of adjacent ileocolic adenopathy. There was also noted to be enlargement of the right hepatic lobe metastatic lesion (7.5 x 6.2 cm). Finally, there was a stable 3.0 x 2.6 cm fluid density lesion of the upper spleen.   PET scan on 04/15/2019 showed intense hypermetabolic mural thickening in the ascending colon that was consistent with primary colorectal carcinoma. There was no metabolic activity associated with the enlarged ileocecal lymph nodes that signified a positive response to chemotherapy. The size of the solitary large hepatic metastasis had decreased with no metabolic activity, also favoring a positive response to chemotherapy.  PET scan on 06/19/2019 showed persistent intense hypermetabolism within the annular primary colonic tumor in the ascending colon with decreased hypermetabolism in the interval, possibly representing treatment effect. There was no hypermetabolic metastatic disease. The solitary hypodense right liver mass had mildly decreased in size and continued to demonstrate no significant FDG uptake, compatible with enduring response to therapy.   PET scan on 09/05/2019 showed that the ascending colon lesion was grossly stable and size and demonstrated persistent marked hypermetabolism that was slightly increased since prior study. The treated right hepatic lobe was stable without progressive findings or new hepatic disease. Finally, the ileocolic lymph nodes appeared stable with persistent low-level FDG uptake. There was no new or progressive abdominal/pelvic lymphadenopathy, nor were there any findings for pulmonary or osseous metastatic disease.  Avastatin was discontinued on 10/21/2019  secondary to DVT. Eliquis was started on 09/30/2019.  PET scan on 12/24/2019 showed a progressive apple core lesion involving the ascending colon, corresponding to the patient's known colon cancer. There was also noted to be associated right mid abdominal nodal metastases in addition to a progressive low-density right hepatic lobe mass that corresponded to treated metastasis (non-FDG avid). Finally, there were new small bilateral pleural effusions.  The patient was last seen by Dr. Marin Olp on 02/26/2020, during which time they discussed possible resection of the primary. If Dr. Lucia Gaskins does not feel comfortable operating on the patient, then radiation therapy was to be considered.  The patient in the interim fell and fractured her right leg and is in a splint/leg cast at this time.  Given these issues she is not felt to be a surgical candidate for resection of the primary site at the ascending colon.   PREVIOUS RADIATION THERAPY: No  PAST MEDICAL HISTORY:  Past Medical History:  Diagnosis Date   Cerebrovascular accident [facility]) 2001   left side weakness   Chronic edema    ? venous insufficiency   COPD (chronic obstructive pulmonary disease) ([place])    never had a problem breathing   Diabetes mellitus without complication ([place])    Dyspnea    Enlarged heart    per pt   Gallstones    GERD (gastroesophageal reflux disease)    Goals of care, counseling/discussion 11/01/2018   Hyperlipidemia    Hypertension    PONV (postoperative nausea and vomiting)    nausea     PAST SURGICAL HISTORY: Past Surgical History:  Procedure Laterality Date   ABDOMINAL HYSTERECTOMY     BIOPSY  10/18/2018   Procedure: BIOPSY;  Surgeon: Jackquline Denmark, MD;  Location: [place];  Service: Endoscopy;;   CHOLECYSTECTOMY     COLONOSCOPY WITH PROPOFOL N/A 10/18/2018   Procedure: COLONOSCOPY WITH PROPOFOL;  Surgeon: Jackquline Denmark, MD;  Location: [facility] ENDOSCOPY;  Service: Endoscopy;  Laterality: N/A;    IR IMAGING GUIDED PORT INSERTION  11/11/2018   IR IVC FILTER PLMT / S&I /IMG GUID/MOD SED  02/11/2020   POLYPECTOMY  10/18/2018   Procedure: POLYPECTOMY;  Surgeon: Jackquline Denmark, MD;  Location: [facility] ENDOSCOPY;  Service: Endoscopy;;   SUBMUCOSAL TATTOO INJECTION  10/18/2018   Procedure: SUBMUCOSAL TATTOO INJECTION;  Surgeon: Jackquline Denmark, MD;  Location: [facility] ENDOSCOPY;  Service: Endoscopy;;    FAMILY HISTORY:  Family History  Problem Relation Age of Onset   Cirrhosis Mother    Heart disease Father    Cancer Other    Hypertension Other    Diabetes Other     SOCIAL HISTORY:  Social History   Tobacco Use   Smoking status: Former Smoker    Packs/day: 0.25    Years: 1.00    Pack years: 0.25    Types: Cigarettes    Quit date: 07/23/1968    Years since quitting: 51.7   Smokeless tobacco: Never Used  Vaping Use   Vaping Use: Never used  Substance Use Topics   Alcohol use: No    Alcohol/week: 0.0 standard drinks   Drug use: No    ALLERGIES:  Allergies  Allergen Reactions   Hydrocodone Swelling and Other (See Comments)    "I started swelling, became red, and passed out"   Iohexol Hives, Shortness Of Breath and Other (See Comments)    Patient developed hives and fullness in throat post injection of 125cc's Omni 300, Onset Date: 11/15/2006  Naproxen Other (See Comments)    "It made me feel out of my head"   Ibuprofen Nausea Only   Propoxyphene N-Acetaminophen Other (See Comments)    "I couldn't find the door to make my way out of the room- I was in misery"    MEDICATIONS:  Current Outpatient Medications  Medication Sig Dispense Refill   Alcohol Swabs (B-D SINGLE USE SWABS REGULAR) PADS 1 each by Does not apply route as needed (to cleanse site prior to obtaining droplet of blood for CBG's). E11.9 100 each 2   amLODipine (NORVASC) 10 MG tablet      apixaban (ELIQUIS) 5 MG TABS tablet TAKE 1 TABLET(5 MG) BY MOUTH TWICE DAILY 60 tablet 3   aspirin EC 81 MG  tablet Take 81 mg by mouth daily.     atorvastatin (LIPITOR) 10 MG tablet Take 1 tablet (10 mg total) by mouth daily. 90 tablet 1   B-D ULTRAFINE III SHORT PEN 31G X 8 MM MISC      famotidine (PEPCID) 40 MG tablet Take 1 tablet (40 mg total) by mouth 2 (two) times daily. 180 tablet 3   insulin degludec (TRESIBA FLEXTOUCH) 100 UNIT/ML FlexTouch Pen Inject 0.21 mLs (21 Units total) into the skin daily. Take 34 units on chemo days 10 pen 11   ketoconazole (NIZORAL) 2 % cream Apply 1 application topically 2 (two) times daily. 60 g 2   lidocaine-prilocaine (EMLA) cream Apply 1 application topically as needed for up to 30 doses. 30 g 1   magic mouthwash SOLN Swish and swallow equal parts Benadryl, Lidocaine and Nystatin.  5- 10 ml.'s four times a day as needed. 240 mL 0   meclizine (ANTIVERT) 12.5 MG tablet TAKE 1 TABLET(12.5 MG) BY MOUTH THREE TIMES DAILY AS NEEDED FOR DIZZINESS 30 tablet 2   metoprolol tartrate (LOPRESSOR) 25 MG tablet Take 1 tablet (25 mg total) by mouth 2 (two) times daily. 180 tablet 0   olmesartan (BENICAR) 20 MG tablet Take 1 tablet (20 mg total) by mouth daily. 90 tablet 1   terbinafine (LAMISIL) 1 % cream Apply topically 2 (two) times daily. PRN     traMADol (ULTRAM) 50 MG tablet TAKE 1 TABLET(50 MG) BY MOUTH EVERY 6 HOURS AS NEEDED 30 tablet 3   TRUE METRIX BLOOD GLUCOSE TEST test strip TEST BLOOD SUGAR TWICE DAILY 200 strip 1   TRUEplus Lancets 30G MISC 1 each by Does not apply route 2 (two) times daily. E11.9 180 each 0   vitamin B-12 (CYANOCOBALAMIN) 500 MCG tablet Take 500 mcg by mouth daily.     dexamethasone (DECADRON) 4 MG tablet Take 2 tablets (8 mg total) by mouth 2 (two) times daily. For four days following chemotherapy. (Patient not taking: Reported on 03/24/2020) 60 tablet 2   loperamide (IMODIUM A-D) 2 MG tablet Take 2 tablets (4 mg total) by mouth 4 (four) times daily as needed. Take 2 at diarrhea onset , then 1 every 2hr until 12hrs with no BM. May  take 2 every 4hrs at night. If diarrhea recurs repeat. (Patient not taking: Reported on 03/24/2020) 100 tablet 1   losartan (COZAAR) 50 MG tablet Take 50 mg by mouth daily.  (Patient not taking: Reported on 03/24/2020)     metroNIDAZOLE (FLAGYL) 500 MG tablet Take 500 mg by mouth as needed. (Patient not taking: Reported on 03/24/2020)     neomycin (MYCIFRADIN) 500 MG tablet Take 1,000 mg by mouth 3 (three) times daily. (Patient not taking: Reported on  03/24/2020)     No current facility-administered medications for this encounter.    REVIEW OF SYSTEMS:  A 10+ POINT REVIEW OF SYSTEMS WAS OBTAINED including neurology, dermatology, psychiatry, cardiac, respiratory, lymph, extremities, GI, GU, musculoskeletal, constitutional, reproductive, HEENT.  Did have some problems with constipation but more recently has had problems with diarrhea associated with eating.  She denies any pain in the right lower quadrant or problems with abdominal bloating.  She is having regular bowel movements.  She denies any rectal bleeding.   PHYSICAL EXAM:  temperature is 98.1 F (36.7 C). Her blood pressure is 121/67 and her pulse is 85. Her respiration is 18 and oxygen saturation is 100%.   General: Alert and oriented, in no acute distress HEENT: Head is normocephalic. Extraocular movements are intact.  Neck: Neck is supple, no palpable cervical or supraclavicular lymphadenopathy. Heart: Regular in rate and rhythm with no murmurs, rubs, or gallops. Chest: Clear to auscultation bilaterally, with no rhonchi, wheezes, or rales. Abdomen: Soft, nontender, nondistended, with no rigidity or guarding. Extremities: No cyanosis or edema. Lymphatics: see Neck Exam Skin: No concerning lesions. Musculoskeletal: Patient has some weakness in her left upper extremity.  She is able to grip things however.  She also has some weakness in the left lower extremity from her stroke.  She wears a ankle support for this issue.  She can stand on  her left leg but is unable to walk given the right leg fracture.  The right leg is wrapped with the brace in place Neurologic: Cranial nerves II through XII are grossly intact. No obvious focalities. Speech is fluent. Coordination is intact. Psychiatric: Judgment and insight are intact. Affect is appropriate.   ECOG = 3  0 - Asymptomatic (Fully active, able to carry on all predisease activities without restriction)  1 - Symptomatic but completely ambulatory (Restricted in physically strenuous activity but ambulatory and able to carry out work of a light or sedentary nature. For example, light housework, office work)  2 - Symptomatic, <50% in bed during the day (Ambulatory and capable of all self care but unable to carry out any work activities. Up and about more than 50% of waking hours)  3 - Symptomatic, >50% in bed, but not bedbound (Capable of only limited self-care, confined to bed or chair 50% or more of waking hours)  4 - Bedbound (Completely disabled. Cannot carry on any self-care. Totally confined to bed or chair)  5 - Death   Eustace Pen MM, Creech RH, Tormey DC, et al. [phone]). "Toxicity and response criteria of the [facility]". [place] Oncol. 5 (6): 649-55  LABORATORY DATA:  Lab Results  Component Value Date   WBC 7.5 02/26/2020   HGB 10.3 (L) 02/26/2020   HCT 33.7 (L) 02/26/2020   MCV 90.1 02/26/2020   PLT 389 02/26/2020   NEUTROABS 5.5 02/26/2020   Lab Results  Component Value Date   NA 142 02/26/2020   K 4.1 02/26/2020   CL 108 02/26/2020   CO2 27 02/26/2020   GLUCOSE 127 (H) 02/26/2020   CREATININE 0.64 02/26/2020   CALCIUM 9.5 02/26/2020      RADIOGRAPHY: No results found.    IMPRESSION: Stage IV adenocarcinoma of the ascending colon  As above the patient has had progressive changes in the site of presentation in the ascending colon with apple core lesion.  She is not a candidate for surgical resection given her multiple medical  issues at this time.  Given these findings  I would recommend radiation therapy to the primary site in an attempt to shrink this area and to hopefully avoid complete bowel obstruction which would be disastrous in this patient.  In addition the patient may benefit from radiosensitizing Xeloda chemotherapy during her radiation treatment and will be seeing Dr. Marin Olp next week for further discussion of this issue.  Today, I talked to the patient and sister about the findings and work-up thus far.  We discussed the natural history of colon cancer and general treatment, highlighting the role of radiotherapy in the management.  We discussed the available radiation techniques, and focused on the details of logistics and delivery.  We reviewed the anticipated acute and late sequelae associated with radiation in this setting.  The patient was encouraged to ask questions that I answered to the best of my ability.  A patient consent form was discussed and signed.  We retained a copy for our records.  The patient would like to proceed with radiation and will be scheduled for CT simulation.  PLAN: The patient is scheduled for CT simulation early next week with treatments to begin approximately a week later, the week of December 13.  Anticipate 28 treatments directed at the ascending colon along with radiosensitizing chemotherapy.  Total time spent in this encounter was 60 minutes which included reviewing the patient's most recent CT scans, PET scans, colonoscopy, pathology report, consultations, follow-ups, chemotherapy, physical examination, and documentation.   ------------------------------------------------  Blair Promise, PhD, MD  This document serves as a record of services personally performed by Gery Pray, MD. It was created on his behalf by Clerance Lav, a trained medical scribe. The creation of this record is based on the scribe's personal observations and the provider's statements to them. This  document has been checked and approved by the attending provider.

## 2020-03-25 ENCOUNTER — Other Ambulatory Visit: Payer: Self-pay | Admitting: *Deleted

## 2020-03-25 ENCOUNTER — Ambulatory Visit: Payer: Medicare HMO | Admitting: Podiatry

## 2020-03-25 NOTE — Patient Outreach (Addendum)
[facility]) Care Management  03/25/2020  MISHKA STEGEMANN 12-23-1940 [id number]  CSW was able to make contact with patient's daughter, Saya Mccoll today, to follow-up regarding social work services and resources for patient.  Ms. Lanzo indicated that she was just waking up at the time of CSW's call, having stayed up all night with patient, cleaning her soiled linens, changing her clothes and underpants, and giving her multiple sponge baths, as patient is currently experiencing "around-the-clock diarrhea".  CSW inquired as to whether or not Ms. Quigley has discussed the loose stools with patient's Primary Care Physician, Dr. Leeroy Cha.  Ms. Ritacco admitted that she had, and was told to reduce the amount of Tramadol that patient receives per day for pain management.  Ms. Hancock went on to explain that patient was experiencing "severe constipation" when the Tramadol was first prescribed, but now that they have decreased the dosage, she is experiencing multiple loose stools per day.  Ms. Viar further reported that the pain in patient's right hip continues to be well-controlled, despite the change in patient's pain management regimen.    Ms. Rachels indicated that she recently learned that patient will require radiation therapy 5 days per week, Monday thru Friday, from now until the end of January of 2022.  Therefore, Ms. Messamore reported that she will now be responsible for making sure that patient is dressed and ready for her 11:00AM radiation therapy appointments.  Patient is currently receiving transportation assistance through [employer], a benefit of patient's insurance provider.  Ms. Pink is aware that [employer] only provides a certain number of rides per fiscal year, encouraging her to consider using [employer], through [employer], or [employer], through the [employer], both free of charge, providing her  with the contact number for both.  CSW also reminded Ms. Michaelsen that [facility] can arrange transportation for patient to and from any physician-related appointment once [employer], [employer] and [employer] have been exhausted, through [employer], courtesy of [employer].  Ms. Kitner denied having had a opportunity to contact any of the home health care agencies, in-home care and respite agencies, or private agency sitters from the list provided to her by CSW.  Ms. Hennen stated, "I am basically the only person available to provide my mother with around-the-clock care during the day because my brother works from 2:00AM until 7:00PM, 5 days a week, then he spends his weekends with his daughter, granddaughter and new great-granddaughter".  CSW inquired about patient's other 3 children, and whether or not they are able to offer any type of assistance.  Ms. Brienza reported that she has a sister who lives in [place], [place], but that she refuses to drive to [place], [place], for fear of the hour and 45 minute drive.  Ms. Mahler stated, "My sister has agreed to help pay for 1 hour of mom's in-home care services per day, for up to 3 days a week, so she has made it clear that this is an adequate enough contribution".       Ms. Pardo further reported that her other sister, who currently resides in [place], [place], "Claims that she is disabled, even though she somehow manages to be able to work at a [employer] 4 hours a day, 3 days a week".  Ms. Slovacek stated, "And as for my other brother, he is battling a  drug and alcohol addiction, and is under house arrest, with a revoked driver's license; otherwise, he would be the perfect man for the job".  CSW offered to provide substance use, abuse and alcohol addiction counseling to Ms. Monteforte's brother, for which she politely declined.  Ms. Ramella then stated,  "So you see, it's just me and Ronnie".  Edd Arbour currently resides with patient in the home, available to care for her from 7:00PM until 2:00AM, Monday thru Friday.  Ms. Rosman reported that she is basically responsible for patient for the remainder of the time, from 2:00AM until 7:00PM, Monday thru Friday, and all day on the weekends.  CSW agreed to begin making calls to various agencies, on patient and Ms. Borchers's behalf, to try and obtain some much needed respite care for Ms. Leece.  CSW encouraged Ms. Covin to provide CSW with a list of days and times that she would like to receive in-home care services for patient, so that CSW can arrange services, as well as provide her with a list of quotes.   Ms. Drahos stated, "I have lost 13 pounds and my children tell me that I have aged tremendously, since caring for my mother".  Ms. Damico further explained that she is neglecting her own health, having to cancel doctor's appointments, missing out on spending time with her own 3 children, and unable to make several months worth of rent payments, due to providing adequate care and medical supplies for patient.  Ms. Rando stated that two of her siblings are willing to offer financial assistance to patient, but refuse to reimburse her for all the money that she has spent, and continues to spend, on caring for their mother.  Ms. Jager stated, "But, my sister is covering the cost of mom's medical expenses, hospital bills, co-pays, prescription medications, durable medical equipment, etc., and Edd Arbour is willing to contribute toward the overall cost of mom's in-home care".  Ms. Abeln agreed to contact CSW as soon as she has had an opportunity to decide how many hours per day, and how many days per week, patient will require in-home care services, so that CSW can make these arrangements on her behalf.  Ms. Sterbenz was also agreeable to having CSW contact her again next week, on Friday, April 02, 2020, around 9:00AM, if CSW has not  received a return call from her in the meantime.  [name], BSW, MSW, LCSW  Licensed Education officer, [facility]  Mailing [place] [address], [place], [place] [address] Physical Address-[address], [place], [place] [address] Toll Free Main # [phone] Fax # [phone] Cell # [phone]  [email or web address]

## 2020-03-26 DIAGNOSIS — Z993 Dependence on wheelchair: Secondary | ICD-10-CM | POA: Diagnosis not present

## 2020-03-26 DIAGNOSIS — I69354 Hemiplegia and hemiparesis following cerebral infarction affecting left non-dominant side: Secondary | ICD-10-CM | POA: Diagnosis not present

## 2020-03-26 DIAGNOSIS — I10 Essential (primary) hypertension: Secondary | ICD-10-CM | POA: Diagnosis not present

## 2020-03-26 DIAGNOSIS — Z9181 History of falling: Secondary | ICD-10-CM | POA: Diagnosis not present

## 2020-03-26 DIAGNOSIS — Z86718 Personal history of other venous thrombosis and embolism: Secondary | ICD-10-CM | POA: Diagnosis not present

## 2020-03-26 DIAGNOSIS — I4891 Unspecified atrial fibrillation: Secondary | ICD-10-CM | POA: Diagnosis not present

## 2020-03-26 DIAGNOSIS — E119 Type 2 diabetes mellitus without complications: Secondary | ICD-10-CM | POA: Diagnosis not present

## 2020-03-26 DIAGNOSIS — Z794 Long term (current) use of insulin: Secondary | ICD-10-CM | POA: Diagnosis not present

## 2020-03-26 DIAGNOSIS — S82831D Other fracture of upper and lower end of right fibula, subsequent encounter for closed fracture with routine healing: Secondary | ICD-10-CM | POA: Diagnosis not present

## 2020-03-30 ENCOUNTER — Ambulatory Visit: Payer: Medicare HMO | Admitting: Radiation Oncology

## 2020-03-31 ENCOUNTER — Inpatient Hospital Stay: Payer: Medicare HMO

## 2020-03-31 ENCOUNTER — Inpatient Hospital Stay: Payer: Medicare HMO | Attending: Hematology & Oncology | Admitting: Hematology & Oncology

## 2020-03-31 ENCOUNTER — Telehealth: Payer: Self-pay

## 2020-03-31 ENCOUNTER — Ambulatory Visit: Payer: Medicare HMO | Admitting: Hematology & Oncology

## 2020-03-31 ENCOUNTER — Encounter: Payer: Self-pay | Admitting: Hematology & Oncology

## 2020-03-31 ENCOUNTER — Other Ambulatory Visit: Payer: Medicare HMO

## 2020-03-31 ENCOUNTER — Other Ambulatory Visit: Payer: Self-pay

## 2020-03-31 ENCOUNTER — Other Ambulatory Visit: Payer: Self-pay | Admitting: *Deleted

## 2020-03-31 VITALS — BP 136/72 | HR 91 | Temp 98.9°F | Resp 17

## 2020-03-31 DIAGNOSIS — C19 Malignant neoplasm of rectosigmoid junction: Secondary | ICD-10-CM

## 2020-03-31 DIAGNOSIS — D649 Anemia, unspecified: Secondary | ICD-10-CM

## 2020-03-31 DIAGNOSIS — Z23 Encounter for immunization: Secondary | ICD-10-CM | POA: Insufficient documentation

## 2020-03-31 DIAGNOSIS — Z86718 Personal history of other venous thrombosis and embolism: Secondary | ICD-10-CM | POA: Insufficient documentation

## 2020-03-31 DIAGNOSIS — Z86711 Personal history of pulmonary embolism: Secondary | ICD-10-CM | POA: Diagnosis not present

## 2020-03-31 DIAGNOSIS — C189 Malignant neoplasm of colon, unspecified: Secondary | ICD-10-CM

## 2020-03-31 DIAGNOSIS — D500 Iron deficiency anemia secondary to blood loss (chronic): Secondary | ICD-10-CM

## 2020-03-31 DIAGNOSIS — Z95828 Presence of other vascular implants and grafts: Secondary | ICD-10-CM

## 2020-03-31 DIAGNOSIS — Z79899 Other long term (current) drug therapy: Secondary | ICD-10-CM | POA: Insufficient documentation

## 2020-03-31 DIAGNOSIS — R6889 Other general symptoms and signs: Secondary | ICD-10-CM | POA: Diagnosis not present

## 2020-03-31 DIAGNOSIS — Z7982 Long term (current) use of aspirin: Secondary | ICD-10-CM | POA: Diagnosis not present

## 2020-03-31 DIAGNOSIS — C182 Malignant neoplasm of ascending colon: Secondary | ICD-10-CM | POA: Diagnosis not present

## 2020-03-31 DIAGNOSIS — C787 Secondary malignant neoplasm of liver and intrahepatic bile duct: Secondary | ICD-10-CM

## 2020-03-31 LAB — CMP (CANCER CENTER ONLY)
ALT: 9 U/L (ref 0–44)
AST: 17 U/L (ref 15–41)
Albumin: 3.2 g/dL — ABNORMAL LOW (ref 3.5–5.0)
Alkaline Phosphatase: 53 U/L (ref 38–126)
Anion gap: 5 (ref 5–15)
BUN: 20 mg/dL (ref 8–23)
CO2: 27 mmol/L (ref 22–32)
Calcium: 9.2 mg/dL (ref 8.9–10.3)
Chloride: 110 mmol/L (ref 98–111)
Creatinine: 0.77 mg/dL (ref 0.44–1.00)
GFR, Estimated: >60 mL/min (ref >60)
Glucose, Bld: 92 mg/dL (ref 70–99)
Potassium: 4 mmol/L (ref 3.5–5.1)
Sodium: 142 mmol/L (ref 135–145)
Total Bilirubin: 0.3 mg/dL (ref 0.3–1.2)
Total Protein: 5.2 g/dL — ABNORMAL LOW (ref 6.5–8.1)

## 2020-03-31 LAB — CBC WITH DIFFERENTIAL (CANCER CENTER ONLY)
Abs Immature Granulocytes: 0.03 10*3/uL (ref 0.00–0.07)
Basophils Absolute: 0 10*3/uL (ref 0.0–0.1)
Basophils Relative: 0 %
Eosinophils Absolute: 0.1 10*3/uL (ref 0.0–0.5)
Eosinophils Relative: 2 %
HCT: 25.7 % — ABNORMAL LOW (ref 36.0–46.0)
Hemoglobin: 7.5 g/dL — ABNORMAL LOW (ref 12.0–15.0)
Immature Granulocytes: 0 %
Lymphocytes Relative: 22 %
Lymphs Abs: 1.6 10*3/uL (ref 0.7–4.0)
MCH: 27.2 pg (ref 26.0–34.0)
MCHC: 29.2 g/dL — ABNORMAL LOW (ref 30.0–36.0)
MCV: 93.1 fL (ref 80.0–100.0)
Monocytes Absolute: 0.5 10*3/uL (ref 0.1–1.0)
Monocytes Relative: 7 %
Neutro Abs: 5 10*3/uL (ref 1.7–7.7)
Neutrophils Relative %: 69 %
Platelet Count: 281 10*3/uL (ref 150–400)
RBC: 2.76 MIL/uL — ABNORMAL LOW (ref 3.87–5.11)
RDW: 19 % — ABNORMAL HIGH (ref 11.5–15.5)
WBC Count: 7.3 10*3/uL (ref 4.0–10.5)
nRBC: 0 % (ref 0.0–0.2)

## 2020-03-31 LAB — SAMPLE TO BLOOD BANK

## 2020-03-31 LAB — PREPARE RBC (CROSSMATCH)

## 2020-03-31 MED ORDER — CAPECITABINE 500 MG PO TABS: 1500.0000 mg | ORAL_TABLET | Freq: Every day | ORAL | 0 refills | Status: DC

## 2020-03-31 MED ORDER — SODIUM CHLORIDE 0.9% FLUSH
10.0000 mL | Freq: Once | INTRAVENOUS | Status: AC
  Administered 2020-03-31: 10 mL via INTRAVENOUS
  Filled 2020-03-31: qty 10

## 2020-03-31 MED ORDER — HEPARIN SOD (PORK) LOCK FLUSH 100 UNIT/ML IV SOLN
500.0000 [IU] | Freq: Once | INTRAVENOUS | Status: AC
  Administered 2020-03-31: 500 [IU] via INTRAVENOUS
  Filled 2020-03-31: qty 5

## 2020-03-31 NOTE — Progress Notes (Signed)
Hematology and Oncology Follow Up Visit  Tonya [name] [id number] 1940-05-20 79 y.o. 03/31/2020   Principle Diagnosis:  Stage IV adenocarcinoma of the ascending colon -- NRAS+, BRAF-, HER2-, MSI/MMR -,  Iron deficiency anemia DVT in LEFT leg-- Peroneal and popliteal vein  Past Therapy: FOLFOX - s/p cycle5  Current Therapy: FOLFIRI/Bevacizumab -started 01/20/2019,s/p cycle #16 -- Avastin d/c'ed on 10/21/2019 due to DVT IV Iron as indicated Eliquis 5 mg po BID -- started on 09/30/2019 --DC on 04/01/2020 secondary to GI bleeding Xeloda with radiation therapy.  To start on 04/08/2020.  Patient will take 1500 mg of Xeloda daily.   Interim History:  Tonya [name] is here today with her daughter for follow-up.  Unfortunately, she is bleeding again.  I told her to stop the Eliquis.  Unfortunately, we do not have any other option outside of stopping the Eliquis.  I think this is all from the tumor that she has.  She will hopefully start radiation next week.  Her hemoglobin is 7.5.  I am sure that she will be iron deficient again.  She is dizzy.  She is weak.  She gets fatigued easily.  I think she is going to need to be transfused.  We will going give her 2 units of blood.  We will also try to get her going on Xeloda.  She will need low-dose Xeloda.  I think given her overall status, we probably will place her on 1500 mg a day Monday to Friday.  She is not complaining of any abdominal pain..  She still is having some problems with the right leg.  She injured the leg.  Her last CEA level in November was actually little better at 6.    Thankfully, she had decent Thanksgiving.  She was with her family.    Currently, I would say performance status is probably ECOG 2.    Medications:  Allergies as of 03/31/2020      Reactions   Hydrocodone Swelling, Other (See Comments)   "I started swelling, became red, and passed out"   Iohexol Hives, Shortness Of Breath, Other (See Comments)    Patient developed hives and fullness in throat post injection of 125cc's Omni 300, Onset Date: 11/15/2006   Naproxen Other (See Comments)   "It made me feel out of my head"   Ibuprofen Nausea Only   Propoxyphene N-acetaminophen Other (See Comments)   "I couldn't find the door to make my way out of the room- I was in misery"      Medication List       Accurate as of March 31, 2020  5:33 PM. If you have any questions, ask your nurse or doctor.        STOP taking these medications   apixaban 5 MG Tabs tablet Commonly known as: Eliquis Stopped by: [name], MD   aspirin EC 81 MG tablet Stopped by: [name], MD   losartan 50 MG tablet Commonly known as: COZAAR Stopped by: [name], MD   olmesartan 20 MG tablet Commonly known as: BENICAR Stopped by: [name], MD     TAKE these medications   amLODipine 10 MG tablet Commonly known as: NORVASC   atorvastatin 10 MG tablet Commonly known as: LIPITOR Take 1 tablet (10 mg total) by mouth daily.   B-D SINGLE USE SWABS REGULAR Pads 1 each by Does not apply route as needed (to cleanse site prior to obtaining droplet of blood for CBG's). E11.9  B-D ULTRAFINE III SHORT PEN 31G X 8 MM Misc Generic drug: Insulin Pen Needle   capecitabine 500 MG tablet Commonly known as: XELODA Take 3 tablets (1,500 mg total) by mouth daily. Start taking on 04/06/2020.  Take Monday thru Friday for 6 weeks Started by: [name], MD   dexamethasone 4 MG tablet Commonly known as: DECADRON Take 2 tablets (8 mg total) by mouth 2 (two) times daily. For four days following chemotherapy.   famotidine 40 MG tablet Commonly known as: Pepcid Take 1 tablet (40 mg total) by mouth 2 (two) times daily.   ketoconazole 2 % cream Commonly known as: NIZORAL Apply 1 application topically 2 (two) times daily.   lidocaine-prilocaine cream Commonly known as: EMLA Apply 1 application topically as needed for up to 30 doses.    loperamide 2 MG tablet Commonly known as: Imodium A-D Take 2 tablets (4 mg total) by mouth 4 (four) times daily as needed. Take 2 at diarrhea onset , then 1 every 2hr until 12hrs with no BM. May take 2 every 4hrs at night. If diarrhea recurs repeat.   magic mouthwash Soln Swish and swallow equal parts Benadryl, Lidocaine and Nystatin.  5- 10 ml.'s four times a day as needed.   meclizine 12.5 MG tablet Commonly known as: ANTIVERT TAKE 1 TABLET(12.5 MG) BY MOUTH THREE TIMES DAILY AS NEEDED FOR DIZZINESS   metoprolol tartrate 25 MG tablet Commonly known as: LOPRESSOR Take 1 tablet (25 mg total) by mouth 2 (two) times daily.   metroNIDAZOLE 500 MG tablet Commonly known as: FLAGYL Take 500 mg by mouth as needed.   neomycin 500 MG tablet Commonly known as: MYCIFRADIN Take 1,000 mg by mouth 3 (three) times daily.   terbinafine 1 % cream Commonly known as: LAMISIL Apply topically 2 (two) times daily. PRN   traMADol 50 MG tablet Commonly known as: ULTRAM TAKE 1 TABLET(50 MG) BY MOUTH EVERY 6 HOURS AS NEEDED   Tresiba FlexTouch 100 UNIT/ML FlexTouch Pen Generic drug: insulin degludec Inject 0.21 mLs (21 Units total) into the skin daily. Take 34 units on chemo days   True Metrix Blood Glucose Test test strip Generic drug: glucose blood TEST BLOOD SUGAR TWICE DAILY   TRUEplus Lancets 30G Misc 1 each by Does not apply route 2 (two) times daily. E11.9   vitamin B-12 500 MCG tablet Commonly known as: CYANOCOBALAMIN Take 500 mcg by mouth daily.       Allergies:  Allergies  Allergen Reactions  . Hydrocodone Swelling and Other (See Comments)    "I started swelling, became red, and passed out"  . Iohexol Hives, Shortness Of Breath and Other (See Comments)    Patient developed hives and fullness in throat post injection of 125cc's Omni 300, Onset Date: 11/15/2006   . Naproxen Other (See Comments)    "It made me feel out of my head"  . Ibuprofen Nausea Only  . Propoxyphene  N-Acetaminophen Other (See Comments)    "I couldn't find the door to make my way out of the room- I was in misery"    Past Medical History, Surgical history, Social history, and Family History were reviewed and updated.  Review of Systems: Review of Systems  Constitutional: Negative.   HENT: Negative.   Eyes: Negative.   Respiratory: Negative.   Cardiovascular: Negative.   Gastrointestinal: Negative.   Genitourinary: Negative.   Musculoskeletal: Negative.   Skin: Negative.   Neurological: Negative.   Endo/Heme/Allergies: Negative.   Psychiatric/Behavioral: Negative.  Physical Exam:  vitals were not taken for this visit.   Wt Readings from Last 3 Encounters:  02/16/20 182 lb (82.6 kg)  01/29/20 187 lb 12.8 oz (85.2 kg)  12/15/19 184 lb (83.5 kg)    Physical Exam Vitals reviewed.  HENT:     Head: Normocephalic and atraumatic.  Eyes:     Pupils: Pupils are equal, round, and reactive to light.  Cardiovascular:     Rate and Rhythm: Normal rate and regular rhythm.     Heart sounds: Normal heart sounds.  Pulmonary:     Effort: Pulmonary effort is normal.     Breath sounds: Normal breath sounds.  Abdominal:     General: Bowel sounds are normal.     Palpations: Abdomen is soft.  Musculoskeletal:        General: No tenderness or deformity. Normal range of motion.     Cervical back: Normal range of motion.     Comments: She has a brace on the right lower leg.  Lymphadenopathy:     Cervical: No cervical adenopathy.  Skin:    General: Skin is warm and dry.     Findings: No erythema or rash.  Neurological:     Mental Status: She is alert and oriented to person, place, and time.     Comments: She has some chronic weakness over on the left side from past stroke.  Psychiatric:        Behavior: Behavior normal.        Thought Content: Thought content normal.        Judgment: Judgment normal.      Lab Results  Component Value Date   WBC 7.3 03/31/2020   HGB 7.5  (L) 03/31/2020   HCT 25.7 (L) 03/31/2020   MCV 93.1 03/31/2020   PLT 281 03/31/2020   Lab Results  Component Value Date   FERRITIN 1,068 (H) 02/26/2020   IRON 33 (L) 02/26/2020   TIBC 190 (L) 02/26/2020   UIBC 157 02/26/2020   IRONPCTSAT 17 (L) 02/26/2020   Lab Results  Component Value Date   RETICCTPCT 2.2 02/26/2020   RBC 2.76 (L) 03/31/2020   RETICCTABS 62,400 05/17/2016   No results found for: KPAFRELGTCHN, LAMBDASER, KAPLAMBRATIO No results found for: IGGSERUM, IGA, IGMSERUM No results found for: Odetta Pink, SPEI   Chemistry      Component Value Date/Time   NA 142 03/31/2020 1415   NA 139 09/30/2018 1311   K 4.0 03/31/2020 1415   CL 110 03/31/2020 1415   CO2 27 03/31/2020 1415   BUN 20 03/31/2020 1415   BUN 10 09/30/2018 1311   CREATININE 0.77 03/31/2020 1415      Component Value Date/Time   CALCIUM 9.2 03/31/2020 1415   ALKPHOS 53 03/31/2020 1415   AST 17 03/31/2020 1415   ALT 9 03/31/2020 1415   BILITOT 0.3 03/31/2020 1415       Impression and Plan: Tonya [name] is a very pleasant 79 yo African American female with metastatic colon cancer.  Again, she does have progressive disease.  She has done quite well with the FOLFIRI regimen.    We unfortunately do not have surgery as an option now.  I think that her health is just not good enough so that she would be able to tolerate surgery.  I think that she would certainly run into complications.  Hopefully, the radiation will help.  I would like to think that radiation with Xeloda  should be able to give [place] some improvement with respect to the GI blood loss.  She will come in on the 10th to have her transfusion.  Again I am sure she will get iron at that time.  I would like to see her back the last week in December so that we can see how she is doing with the Xeloda.    [name], MD 12/8/20215:33 PM

## 2020-03-31 NOTE — Telephone Encounter (Signed)
Add blood for pt for 04/02/20 per 03/31/20 inbasket message, pt to arrive at 9:00, Tonya [name] and Dr PE to advise pt here in the office... AOM

## 2020-04-01 ENCOUNTER — Other Ambulatory Visit: Payer: Self-pay

## 2020-04-01 ENCOUNTER — Other Ambulatory Visit: Payer: Self-pay | Admitting: *Deleted

## 2020-04-01 ENCOUNTER — Ambulatory Visit
Admission: RE | Admit: 2020-04-01 | Discharge: 2020-04-01 | Disposition: A | Discharge status: Home / Self Care | Payer: Medicare HMO | Source: Ambulatory Visit | Attending: Radiation Oncology | Admitting: Radiation Oncology

## 2020-04-01 ENCOUNTER — Telehealth: Payer: Self-pay | Admitting: Pharmacist

## 2020-04-01 ENCOUNTER — Telehealth: Payer: Self-pay | Admitting: Pharmacy Technician

## 2020-04-01 ENCOUNTER — Telehealth: Payer: Self-pay | Admitting: Hematology & Oncology

## 2020-04-01 DIAGNOSIS — C182 Malignant neoplasm of ascending colon: Secondary | ICD-10-CM | POA: Diagnosis not present

## 2020-04-01 DIAGNOSIS — Z510 Encounter for antineoplastic radiation therapy: Secondary | ICD-10-CM | POA: Insufficient documentation

## 2020-04-01 DIAGNOSIS — R6889 Other general symptoms and signs: Secondary | ICD-10-CM | POA: Diagnosis not present

## 2020-04-01 DIAGNOSIS — C189 Malignant neoplasm of colon, unspecified: Secondary | ICD-10-CM

## 2020-04-01 LAB — CEA (IN HOUSE-CHCC): CEA (CHCC-In House): 7.56 ng/mL — ABNORMAL HIGH (ref 0.00–5.00)

## 2020-04-01 LAB — IRON AND TIBC
Iron: 23 ug/dL — ABNORMAL LOW (ref 41–142)
Saturation Ratios: 10 % — ABNORMAL LOW (ref 21–57)
TIBC: 219 ug/dL — ABNORMAL LOW (ref 236–444)
UIBC: 196 ug/dL (ref 120–384)

## 2020-04-01 LAB — FERRITIN: Ferritin: 361 ng/mL — ABNORMAL HIGH (ref 11–307)

## 2020-04-01 NOTE — Telephone Encounter (Signed)
Oral Oncology Patient Advocate Encounter  Received notification from [facility] that prior authorization for Xeloda (Capecitabine) is required.  PA submitted on CoverMyMeds Key [id number]  Status is pending  Oral Oncology Clinic will continue to follow.  [place] Patient [place] Phone [phone] Fax [phone] 04/01/2020 1:34 PM

## 2020-04-01 NOTE — Telephone Encounter (Signed)
Oral Oncology Pharmacist Encounter  Received new prescription for Xeloda (capecitabine) for the treatment of metastatic colon cancer in conjunction with XRT, planned duration until the end of XRT.  CMP from 03/31/20 assessed, no relevant lab abnormalities. Prescription dose and frequency assessed. MD plan on using a lower dose for this patient.  Current medication list in Epic reviewed, one DDIs with identified: - Metronidazole: may increase capecitabine adverse effects. Metronidazole listed as "as needed" on her medications list. Will verify with patient when or whether she is taking the metronidazole.   Evaluated chart and no patient barriers to medication adherence identified.   Prescription has been e-scribed to the [facility] for benefits analysis and approval.  Oral Oncology Clinic will continue to follow for insurance authorization, copayment issues, initial counseling and start date.  Darl Pikes, PharmD, BCPS, BCOP, CPP Hematology/Oncology Clinical Pharmacist Practitioner [facility] [phone]  04/01/2020 9:10 AM

## 2020-04-01 NOTE — Telephone Encounter (Signed)
Appointments scheduled patient to get updated calendar at 12/10 visit .per 12/8 los

## 2020-04-01 NOTE — Patient Outreach (Signed)
[facility]) Care Management  04/01/2020  Tonya [name] [birth year] [id number]   CSW received an In [employer] in [place] from Lavell Islam, Web designer with [facility] [employer], indicating that patient's daughter, Breelle Hollywood called and stated the following:  "Need to cancel her appointment with you tomorrow. She has to be at her oncologist's office at 9:00 am, and will be there for 6 hours", requesting that [place] reschedule the call for Monday, April 05, 2020, anytime after noon.  CSW attempted to make contact with Ms. Bushway to explain to her that CSW has actually put her on the schedule to follow-up on Tuesday, April 06, 2020, around 11:00AM, and would this time and date be convenient for her; however, she was unavailable at the time of CSW's call.  CSW left a HIPAA compliant message on voicemail for [name], explaining all of the above information, adding "if this time and date are not convenient for you, please feel free to contact me directly", providing her with CSW's contact information.  [name], BSW, MSW, LCSW  Licensed Education officer, environmental [facility]  Mailing [place] [address] [place], [place] [address] Physical Address-[address], [place], [place] [address] Toll Free Main # [phone] Fax # [phone] Cell # [phone]  [email or web address]

## 2020-04-02 ENCOUNTER — Inpatient Hospital Stay: Payer: Medicare HMO

## 2020-04-02 ENCOUNTER — Ambulatory Visit: Payer: Self-pay | Admitting: *Deleted

## 2020-04-02 ENCOUNTER — Other Ambulatory Visit: Payer: Self-pay

## 2020-04-02 VITALS — BP 150/71 | HR 73 | Temp 97.8°F | Resp 18

## 2020-04-02 DIAGNOSIS — Z79899 Other long term (current) drug therapy: Secondary | ICD-10-CM | POA: Diagnosis not present

## 2020-04-02 DIAGNOSIS — Z23 Encounter for immunization: Secondary | ICD-10-CM | POA: Diagnosis not present

## 2020-04-02 DIAGNOSIS — R6889 Other general symptoms and signs: Secondary | ICD-10-CM | POA: Diagnosis not present

## 2020-04-02 DIAGNOSIS — D649 Anemia, unspecified: Secondary | ICD-10-CM

## 2020-04-02 DIAGNOSIS — D500 Iron deficiency anemia secondary to blood loss (chronic): Secondary | ICD-10-CM

## 2020-04-02 DIAGNOSIS — C19 Malignant neoplasm of rectosigmoid junction: Secondary | ICD-10-CM

## 2020-04-02 DIAGNOSIS — Z7982 Long term (current) use of aspirin: Secondary | ICD-10-CM | POA: Diagnosis not present

## 2020-04-02 DIAGNOSIS — Z86718 Personal history of other venous thrombosis and embolism: Secondary | ICD-10-CM | POA: Diagnosis not present

## 2020-04-02 DIAGNOSIS — Z86711 Personal history of pulmonary embolism: Secondary | ICD-10-CM | POA: Diagnosis not present

## 2020-04-02 DIAGNOSIS — C189 Malignant neoplasm of colon, unspecified: Secondary | ICD-10-CM

## 2020-04-02 DIAGNOSIS — C182 Malignant neoplasm of ascending colon: Secondary | ICD-10-CM | POA: Diagnosis not present

## 2020-04-02 MED ORDER — DIPHENHYDRAMINE HCL 25 MG PO CAPS
25.0000 mg | ORAL_CAPSULE | Freq: Once | ORAL | Status: AC
  Administered 2020-04-02: 25 mg via ORAL

## 2020-04-02 MED ORDER — SODIUM CHLORIDE 0.9 % IV SOLN
200.0000 mg | Freq: Once | INTRAVENOUS | Status: AC
  Administered 2020-04-02: 200 mg via INTRAVENOUS
  Filled 2020-04-02: qty 200

## 2020-04-02 MED ORDER — DIPHENHYDRAMINE HCL 25 MG PO CAPS
ORAL_CAPSULE | ORAL | Status: AC
  Filled 2020-04-02: qty 1

## 2020-04-02 MED ORDER — HEPARIN SOD (PORK) LOCK FLUSH 100 UNIT/ML IV SOLN
500.0000 [IU] | Freq: Every day | INTRAVENOUS | Status: DC | PRN
  Filled 2020-04-02: qty 5

## 2020-04-02 MED ORDER — HEPARIN SOD (PORK) LOCK FLUSH 100 UNIT/ML IV SOLN
500.0000 [IU] | Freq: Once | INTRAVENOUS | Status: AC | PRN
  Administered 2020-04-02: 500 [IU]
  Filled 2020-04-02: qty 5

## 2020-04-02 MED ORDER — ACETAMINOPHEN 325 MG PO TABS
ORAL_TABLET | ORAL | Status: AC
  Filled 2020-04-02: qty 2

## 2020-04-02 MED ORDER — DIPHENHYDRAMINE HCL 25 MG PO CAPS: 25.0000 mg | ORAL_CAPSULE | Freq: Once | ORAL | Status: DC

## 2020-04-02 MED ORDER — ACETAMINOPHEN 325 MG PO TABS: 650.0000 mg | ORAL_TABLET | Freq: Once | ORAL | Status: DC

## 2020-04-02 MED ORDER — SODIUM CHLORIDE 0.9% IV SOLUTION
250.0000 mL | Freq: Once | INTRAVENOUS | Status: DC
  Filled 2020-04-02: qty 250

## 2020-04-02 MED ORDER — SODIUM CHLORIDE 0.9 % IV SOLN
INTRAVENOUS | Status: DC
  Filled 2020-04-02: qty 250

## 2020-04-02 MED ORDER — SODIUM CHLORIDE 0.9% FLUSH
10.0000 mL | INTRAVENOUS | Status: DC | PRN
  Administered 2020-04-02: 10 mL
  Filled 2020-04-02: qty 10

## 2020-04-02 MED ORDER — ACETAMINOPHEN 325 MG PO TABS
650.0000 mg | ORAL_TABLET | Freq: Once | ORAL | Status: AC
  Administered 2020-04-02: 650 mg via ORAL

## 2020-04-02 NOTE — Progress Notes (Signed)
Pt discharged in no apparent distress. Pt left ambulatory with assistance ( wheelchair). Pt aware of discharge instructions and verbalized understanding and had no further questions. Stable and asymptomatic at discharge.

## 2020-04-02 NOTE — Progress Notes (Signed)
Na

## 2020-04-02 NOTE — Patient Instructions (Signed)
Blood Transfusion, Adult, Care After This sheet gives you information about how to care for yourself after your procedure. Your doctor may also give you more specific instructions. If you have problems or questions, contact your doctor. What can I expect after the procedure? After the procedure, it is common to have:  Bruising and soreness at the IV site.  A fever or chills on the day of the procedure. This may be your body's response to the new blood cells received.  A headache. Follow these instructions at home: Insertion site care      Follow instructions from your doctor about how to take care of your insertion site. This is where an IV tube was put into your vein. Make sure you: ? Wash your hands with soap and water before and after you change your bandage (dressing). If you cannot use soap and water, use hand sanitizer. ? Change your bandage as told by your doctor.  Check your insertion site every day for signs of infection. Check for: ? Redness, swelling, or pain. ? Bleeding from the site. ? Warmth. ? Pus or a bad smell. General instructions  Take over-the-counter and prescription medicines only as told by your doctor.  Rest as told by your doctor.  Go back to your normal activities as told by your doctor.  Keep all follow-up visits as told by your doctor. This is important. Contact a doctor if:  You have itching or red, swollen areas of skin (hives).  You feel worried or nervous (anxious).  You feel weak after doing your normal activities.  You have redness, swelling, warmth, or pain around the insertion site.  You have blood coming from the insertion site, and the blood does not stop with pressure.  You have pus or a bad smell coming from the insertion site. Get help right away if:  You have signs of a serious reaction. This may be coming from an allergy or the body's defense system (immune system). Signs include: ? Trouble breathing or shortness of  breath. ? Swelling of the face or feeling warm (flushed). ? Fever or chills. ? Head, chest, or back pain. ? Dark pee (urine) or blood in the pee. ? Widespread rash. ? Fast heartbeat. ? Feeling dizzy or light-headed. You may receive your blood transfusion in an outpatient setting. If so, you will be told whom to contact to report any reactions. These symptoms may be an emergency. Do not wait to see if the symptoms will go away. Get medical help right away. Call your local emergency services (911 in the [place] Do not drive yourself to the hospital. Summary  Bruising and soreness at the IV site are common.  Check your insertion site every day for signs of infection.  Rest as told by your doctor. Go back to your normal activities as told by your doctor.  Get help right away if you have signs of a serious reaction. This information is not intended to replace advice given to you by your health care provider. Make sure you discuss any questions you have with your health care provider. Document Revised: 10/03/2018 Document Reviewed: 10/03/2018 Elsevier Patient Education  2020 Elsevier IFerumoxytol injection What is this medicine? FERUMOXYTOL is an iron complex. Iron is used to make healthy red blood cells, which carry oxygen and nutrients throughout the body. This medicine is used to treat iron deficiency anemia. This medicine may be used for other purposes; ask your health care provider or pharmacist if you have questions.  COMMON BRAND NAME(S): Feraheme What should I tell my health care provider before I take this medicine? They need to know if you have any of these conditions:  anemia not caused by low iron levels  high levels of iron in the blood  magnetic resonance imaging (MRI) test scheduled  an unusual or allergic reaction to iron, other medicines, foods, dyes, or preservatives  pregnant or trying to get pregnant  breast-feeding How should I use this medicine? This medicine  is for injection into a vein. It is given by a health care professional in a hospital or clinic setting. Talk to your pediatrician regarding the use of this medicine in children. Special care may be needed. Overdosage: If you think you have taken too much of this medicine contact a poison control center or emergency room at once. NOTE: This medicine is only for you. Do not share this medicine with others. What if I miss a dose? It is important not to miss your dose. Call your doctor or health care professional if you are unable to keep an appointment. What may interact with this medicine? This medicine may interact with the following medications:  other iron products This list may not describe all possible interactions. Give your health care provider a list of all the medicines, herbs, non-prescription drugs, or dietary supplements you use. Also tell them if you smoke, drink alcohol, or use illegal drugs. Some items may interact with your medicine. What should I watch for while using this medicine? Visit your doctor or healthcare professional regularly. Tell your doctor or healthcare professional if your symptoms do not start to get better or if they get worse. You may need blood work done while you are taking this medicine. You may need to follow a special diet. Talk to your doctor. Foods that contain iron include: whole grains/cereals, dried fruits, beans, or peas, leafy green vegetables, and organ meats (liver, kidney). What side effects may I notice from receiving this medicine? Side effects that you should report to your doctor or health care professional as soon as possible:  allergic reactions like skin rash, itching or hives, swelling of the face, lips, or tongue  breathing problems  changes in blood pressure  feeling faint or lightheaded, falls  fever or chills  flushing, sweating, or hot feelings  swelling of the ankles or feet Side effects that usually do not require medical  attention (report to your doctor or health care professional if they continue or are bothersome):  diarrhea  headache  nausea, vomiting  stomach pain This list may not describe all possible side effects. Call your doctor for medical advice about side effects. You may report side effects to FDA at 1-800-FDA-1088. Where should I keep my medicine? This drug is given in a hospital or clinic and will not be stored at home. NOTE: This sheet is a summary. It may not cover all possible information. If you have questions about this medicine, talk to your doctor, pharmacist, or health care provider.  2020 Elsevier/Gold Standard (2016-05-29 20:21:10)

## 2020-04-02 NOTE — Telephone Encounter (Signed)
Oral Oncology Patient Advocate Encounter  Prior Authorization for Xeloda has been approved under part B benefit.  Effective dates: 04/01/20 through 04/23/21  Patients co-pay is $0.00. (May be higher in January)  Oral Oncology Clinic will continue to follow.   [place] Patient [place] Phone [phone] Fax [phone] 04/02/2020 2:16 PM

## 2020-04-03 LAB — BPAM RBC
Blood Product Expiration Date: 202201052359
Blood Product Expiration Date: 202201112359
ISSUE DATE / TIME: 202112100754
ISSUE DATE / TIME: 202112100754
Unit Type and Rh: 6200
Unit Type and Rh: 6200

## 2020-04-03 LAB — TYPE AND SCREEN
ABO/RH(D): A POS
Antibody Screen: NEGATIVE
Unit division: 0
Unit division: 0

## 2020-04-05 ENCOUNTER — Ambulatory Visit: Payer: Medicare HMO | Admitting: Radiation Oncology

## 2020-04-05 MED FILL — CAPECITABINE 500 MG TABLET: 500 | 28 days supply | Qty: 60 | Fill #0

## 2020-04-05 NOTE — Telephone Encounter (Signed)
Oral Oncology Patient Advocate Encounter  I spoke with patients daughter, [name], this afternoon to set up delivery of Xeloda.  Address verified for shipment.  Xeloda will be filled through [facility] and mailed 12/13 for delivery 12/14.    [facility] will call 7-10 days before next refill is due to complete adherence call and set up delivery of medication.     [place] Patient [place] Phone [phone] Fax [phone] 04/05/2020 3:37 PM

## 2020-04-05 NOTE — Telephone Encounter (Signed)
Oral Chemotherapy Pharmacist Encounter  Patient Education I spoke with patient's daughter [name] for overview of new oral chemotherapy medication: Xeloda (capecitabine) for the treatment of metastatic colon cancer in conjunction with XRT, planned duration until the end of XRT.   Counseled patient on administration, dosing, side effects, monitoring, drug-food interactions, safe handling, storage, and disposal. Patient will take 3 tablets (1,500 mg total) by mouth daily. Start taking on 04/06/2020. Take Monday thru Friday for 6 weeks.  Side effects include but not limited to: diarrhea, hand-foot syndrome, N/V, fatigue, decreased wbc, edema.    Reviewed with [name] importance of keeping a medication schedule and plan for any missed doses.  After discussion with [name], no patient barriers to medication adherence identified.   [name]  voiced understanding and appreciation. All questions answered. Medication handout provided.  Provided patient with [facility] phone number. Patient knows to call the office with questions or concerns. Oral Chemotherapy Navigation Clinic will continue to follow.  Darl Pikes, PharmD, BCPS, BCOP, CPP Hematology/Oncology Clinical Pharmacist Practitioner [facility] [phone]  04/05/2020 1:43 PM

## 2020-04-06 ENCOUNTER — Ambulatory Visit: Payer: Medicare HMO | Admitting: Radiation Oncology

## 2020-04-06 ENCOUNTER — Other Ambulatory Visit: Payer: Self-pay | Admitting: *Deleted

## 2020-04-06 NOTE — Patient Outreach (Signed)
[facility]) Care Management  04/06/2020  Tonya [name] 02/02/1941 [id number]   CSW made an attempt to try and contact patient's daughter, Normagene Harvie today, to follow-up regarding social work services and resources for patient, as well as to provide her with the list of in-home care agencies that CSW researched for her; however, she was unavailable at the time of CSW's call.  CSW left a HIPAA compliant message on voicemail for Mrs. Knueppel and is currently awaiting a return call.  CSW will make a second outreach attempt within the next 3-4 business days, if a return call is not received from Mrs. Pirozzi in the meantime.    [name], BSW, MSW, LCSW  Licensed Education officer, environmental [facility] System  Mailing [place] [address] [place], [place] [address] Physical Address-[address], [place], [place] [address] Toll Free Main # [phone] Fax # [phone] Cell # [phone]  [email or web address]

## 2020-04-07 ENCOUNTER — Ambulatory Visit: Payer: Medicare HMO

## 2020-04-07 ENCOUNTER — Encounter: Payer: Self-pay | Admitting: Hematology & Oncology

## 2020-04-07 DIAGNOSIS — I10 Essential (primary) hypertension: Secondary | ICD-10-CM | POA: Diagnosis not present

## 2020-04-07 DIAGNOSIS — E119 Type 2 diabetes mellitus without complications: Secondary | ICD-10-CM | POA: Diagnosis not present

## 2020-04-07 DIAGNOSIS — Z794 Long term (current) use of insulin: Secondary | ICD-10-CM | POA: Diagnosis not present

## 2020-04-07 DIAGNOSIS — Z993 Dependence on wheelchair: Secondary | ICD-10-CM | POA: Diagnosis not present

## 2020-04-07 DIAGNOSIS — I69354 Hemiplegia and hemiparesis following cerebral infarction affecting left non-dominant side: Secondary | ICD-10-CM | POA: Diagnosis not present

## 2020-04-07 DIAGNOSIS — Z86718 Personal history of other venous thrombosis and embolism: Secondary | ICD-10-CM | POA: Diagnosis not present

## 2020-04-07 DIAGNOSIS — I4891 Unspecified atrial fibrillation: Secondary | ICD-10-CM | POA: Diagnosis not present

## 2020-04-07 DIAGNOSIS — C182 Malignant neoplasm of ascending colon: Secondary | ICD-10-CM | POA: Diagnosis not present

## 2020-04-07 DIAGNOSIS — S82831D Other fracture of upper and lower end of right fibula, subsequent encounter for closed fracture with routine healing: Secondary | ICD-10-CM | POA: Diagnosis not present

## 2020-04-07 DIAGNOSIS — Z510 Encounter for antineoplastic radiation therapy: Secondary | ICD-10-CM | POA: Diagnosis not present

## 2020-04-07 DIAGNOSIS — Z9181 History of falling: Secondary | ICD-10-CM | POA: Diagnosis not present

## 2020-04-08 ENCOUNTER — Ambulatory Visit: Payer: Medicare HMO | Admitting: Hematology & Oncology

## 2020-04-08 ENCOUNTER — Ambulatory Visit: Payer: Medicare HMO

## 2020-04-08 ENCOUNTER — Ambulatory Visit
Admission: RE | Admit: 2020-04-08 | Discharge: 2020-04-08 | Disposition: A | Discharge status: Home / Self Care | Payer: Medicare HMO | Source: Ambulatory Visit | Attending: Radiation Oncology | Admitting: Radiation Oncology

## 2020-04-08 ENCOUNTER — Other Ambulatory Visit: Payer: Medicare HMO

## 2020-04-08 ENCOUNTER — Other Ambulatory Visit: Payer: Self-pay

## 2020-04-08 DIAGNOSIS — C189 Malignant neoplasm of colon, unspecified: Secondary | ICD-10-CM

## 2020-04-08 DIAGNOSIS — R6889 Other general symptoms and signs: Secondary | ICD-10-CM | POA: Diagnosis not present

## 2020-04-08 DIAGNOSIS — Z510 Encounter for antineoplastic radiation therapy: Secondary | ICD-10-CM | POA: Diagnosis not present

## 2020-04-08 DIAGNOSIS — C182 Malignant neoplasm of ascending colon: Secondary | ICD-10-CM | POA: Diagnosis not present

## 2020-04-09 ENCOUNTER — Ambulatory Visit
Admission: RE | Admit: 2020-04-09 | Discharge: 2020-04-09 | Disposition: A | Discharge status: Home / Self Care | Payer: Medicare HMO | Source: Ambulatory Visit | Attending: Radiation Oncology | Admitting: Radiation Oncology

## 2020-04-09 ENCOUNTER — Telehealth: Payer: Self-pay

## 2020-04-09 ENCOUNTER — Ambulatory Visit: Payer: Medicare HMO

## 2020-04-09 ENCOUNTER — Other Ambulatory Visit: Payer: Self-pay

## 2020-04-09 DIAGNOSIS — C182 Malignant neoplasm of ascending colon: Secondary | ICD-10-CM | POA: Diagnosis not present

## 2020-04-09 DIAGNOSIS — Z510 Encounter for antineoplastic radiation therapy: Secondary | ICD-10-CM | POA: Diagnosis not present

## 2020-04-09 DIAGNOSIS — R6889 Other general symptoms and signs: Secondary | ICD-10-CM | POA: Diagnosis not present

## 2020-04-09 NOTE — Telephone Encounter (Signed)
Returned patient's daughter's call.[name]) She was asking for a list of foods and drinks that her mother could eat without causing abdominal discomfort. Advised her of a few items and told her that a copy of the "Radiation and You" handbook would be at [facility] when she goes in for tx on 04/12/20. Advised to review page 51 which is marked.

## 2020-04-12 ENCOUNTER — Ambulatory Visit: Payer: Medicare HMO | Admitting: Radiation Oncology

## 2020-04-12 ENCOUNTER — Other Ambulatory Visit: Payer: Self-pay | Admitting: *Deleted

## 2020-04-12 ENCOUNTER — Encounter: Payer: Self-pay | Admitting: *Deleted

## 2020-04-12 ENCOUNTER — Ambulatory Visit
Admission: RE | Admit: 2020-04-12 | Discharge: 2020-04-12 | Disposition: A | Discharge status: Home / Self Care | Payer: Medicare HMO | Source: Ambulatory Visit | Attending: Radiation Oncology | Admitting: Radiation Oncology

## 2020-04-12 ENCOUNTER — Other Ambulatory Visit: Payer: Self-pay

## 2020-04-12 DIAGNOSIS — Z86718 Personal history of other venous thrombosis and embolism: Secondary | ICD-10-CM | POA: Diagnosis not present

## 2020-04-12 DIAGNOSIS — Z9181 History of falling: Secondary | ICD-10-CM | POA: Diagnosis not present

## 2020-04-12 DIAGNOSIS — I10 Essential (primary) hypertension: Secondary | ICD-10-CM | POA: Diagnosis not present

## 2020-04-12 DIAGNOSIS — E119 Type 2 diabetes mellitus without complications: Secondary | ICD-10-CM | POA: Diagnosis not present

## 2020-04-12 DIAGNOSIS — I69354 Hemiplegia and hemiparesis following cerebral infarction affecting left non-dominant side: Secondary | ICD-10-CM | POA: Diagnosis not present

## 2020-04-12 DIAGNOSIS — Z993 Dependence on wheelchair: Secondary | ICD-10-CM | POA: Diagnosis not present

## 2020-04-12 DIAGNOSIS — Z510 Encounter for antineoplastic radiation therapy: Secondary | ICD-10-CM | POA: Diagnosis not present

## 2020-04-12 DIAGNOSIS — I4891 Unspecified atrial fibrillation: Secondary | ICD-10-CM | POA: Diagnosis not present

## 2020-04-12 DIAGNOSIS — R6889 Other general symptoms and signs: Secondary | ICD-10-CM | POA: Diagnosis not present

## 2020-04-12 DIAGNOSIS — Z794 Long term (current) use of insulin: Secondary | ICD-10-CM | POA: Diagnosis not present

## 2020-04-12 DIAGNOSIS — S82831D Other fracture of upper and lower end of right fibula, subsequent encounter for closed fracture with routine healing: Secondary | ICD-10-CM | POA: Diagnosis not present

## 2020-04-12 DIAGNOSIS — C182 Malignant neoplasm of ascending colon: Secondary | ICD-10-CM | POA: Diagnosis not present

## 2020-04-12 NOTE — Patient Outreach (Signed)
[facility]) Care Management  04/12/2020  Tonya [name] [birth year] [id number]   CSW was able to make contact with patient's daughter, Tonya [name] today, to follow-up regarding social work services and resources for patient.  CSW explained to [name] that [place] took quite a bit of time researching home health care agencies, in-home care and respite agencies, and private agency sitters, all on her behalf, as she admitted to [place] that she did not have the time to do so herself.  CSW went on to explain to [name] that CSW was able to compile a short list, from the resources initially provided to her by CSW, only reciting to her the list of agencies that are able to perform a minimum of 3 hours per day, and less than $20.00 per visit.  CSW further explained to [name] that all she will need to do moving forward is pick from the list of condensed agencies and contact them to arrange services for patient.    CSW will perform a case closure on patient, as all goals of treatment have been met from social work standpoint and no additional social work needs have been identified at this time.  CSW will notify patient's Telephonic RNCM with [facility], Jon Billings of CSW's plans to close patient's case.  CSW will fax an update to patient's Primary Care Physician, Dr. Leeroy Cha to ensure that they are aware of CSW's involvement with patient's plan of care, in addition to sending a Physician Case Closure Letter.  CSW was able to confirm that [name] has the correct contact information for CSW, encouraging her to contact CSW directly if additional social work needs arise in the near future.  [name] voiced understanding and was agreeable to this plan, appreciative of all services provided to her thus far.    [name], BSW, MSW, LCSW  Licensed Education officer, [facility]  Mailing  [place] [address] [place], [place] [address] Physical Address-[address] [place], [place] [address] Toll Free Main # [phone] Fax # [phone] Cell # [phone]  [email or web address]

## 2020-04-13 ENCOUNTER — Ambulatory Visit
Admission: RE | Admit: 2020-04-13 | Discharge: 2020-04-13 | Disposition: A | Discharge status: Home / Self Care | Payer: Medicare HMO | Source: Ambulatory Visit | Attending: Radiation Oncology | Admitting: Radiation Oncology

## 2020-04-13 ENCOUNTER — Other Ambulatory Visit: Payer: Self-pay

## 2020-04-13 DIAGNOSIS — Z510 Encounter for antineoplastic radiation therapy: Secondary | ICD-10-CM | POA: Diagnosis not present

## 2020-04-13 DIAGNOSIS — R6889 Other general symptoms and signs: Secondary | ICD-10-CM | POA: Diagnosis not present

## 2020-04-13 DIAGNOSIS — C182 Malignant neoplasm of ascending colon: Secondary | ICD-10-CM | POA: Diagnosis not present

## 2020-04-14 ENCOUNTER — Inpatient Hospital Stay: Payer: Medicare HMO

## 2020-04-14 ENCOUNTER — Ambulatory Visit
Admission: RE | Admit: 2020-04-14 | Discharge: 2020-04-14 | Disposition: A | Discharge status: Home / Self Care | Payer: Medicare HMO | Source: Ambulatory Visit | Attending: Radiation Oncology | Admitting: Radiation Oncology

## 2020-04-14 ENCOUNTER — Other Ambulatory Visit: Payer: Self-pay

## 2020-04-14 DIAGNOSIS — Z23 Encounter for immunization: Secondary | ICD-10-CM | POA: Diagnosis not present

## 2020-04-14 DIAGNOSIS — Z7982 Long term (current) use of aspirin: Secondary | ICD-10-CM | POA: Diagnosis not present

## 2020-04-14 DIAGNOSIS — C182 Malignant neoplasm of ascending colon: Secondary | ICD-10-CM | POA: Diagnosis not present

## 2020-04-14 DIAGNOSIS — D500 Iron deficiency anemia secondary to blood loss (chronic): Secondary | ICD-10-CM | POA: Diagnosis present

## 2020-04-14 DIAGNOSIS — Z79899 Other long term (current) drug therapy: Secondary | ICD-10-CM | POA: Diagnosis not present

## 2020-04-14 DIAGNOSIS — Z86711 Personal history of pulmonary embolism: Secondary | ICD-10-CM | POA: Diagnosis present

## 2020-04-14 DIAGNOSIS — Z86718 Personal history of other venous thrombosis and embolism: Secondary | ICD-10-CM | POA: Diagnosis not present

## 2020-04-14 DIAGNOSIS — Z510 Encounter for antineoplastic radiation therapy: Secondary | ICD-10-CM | POA: Diagnosis not present

## 2020-04-14 NOTE — Progress Notes (Signed)
   Covid-19 Vaccination Clinic  Name:  Tonya [name]    MRN: [id number] DOB: 1941-04-10  04/14/2020  [name] was observed post Covid-19 immunization for 15 minutes without incident. She was provided with Vaccine Information Sheet and instruction to access the V-Safe system.   [name] was instructed to call 911 with any severe reactions post vaccine: Marland Kitchen Difficulty breathing  . Swelling of face and throat  . A fast heartbeat  . A bad rash all over body  . Dizziness and weakness   Immunizations Administered    Name Date Dose VIS Date Route   Pfizer COVID-19 Vaccine 04/14/2020 12:13 PM 0.3 mL 02/11/2020 Intramuscular   Manufacturer: [place]   Lot: [id number]   [place]: [id number]

## 2020-04-15 ENCOUNTER — Ambulatory Visit
Admission: RE | Admit: 2020-04-15 | Discharge: 2020-04-15 | Disposition: A | Discharge status: Home / Self Care | Payer: Medicare HMO | Source: Ambulatory Visit | Attending: Radiation Oncology | Admitting: Radiation Oncology

## 2020-04-15 ENCOUNTER — Other Ambulatory Visit: Payer: Self-pay

## 2020-04-15 DIAGNOSIS — Z510 Encounter for antineoplastic radiation therapy: Secondary | ICD-10-CM | POA: Diagnosis not present

## 2020-04-15 DIAGNOSIS — C182 Malignant neoplasm of ascending colon: Secondary | ICD-10-CM | POA: Diagnosis not present

## 2020-04-19 ENCOUNTER — Ambulatory Visit
Admission: RE | Admit: 2020-04-19 | Discharge: 2020-04-19 | Disposition: A | Discharge status: Home / Self Care | Payer: Medicare HMO | Source: Ambulatory Visit | Attending: Radiation Oncology | Admitting: Radiation Oncology

## 2020-04-19 DIAGNOSIS — Z510 Encounter for antineoplastic radiation therapy: Secondary | ICD-10-CM | POA: Diagnosis not present

## 2020-04-19 DIAGNOSIS — C182 Malignant neoplasm of ascending colon: Secondary | ICD-10-CM | POA: Diagnosis not present

## 2020-04-20 ENCOUNTER — Inpatient Hospital Stay: Payer: Medicare HMO

## 2020-04-20 ENCOUNTER — Other Ambulatory Visit: Payer: Self-pay

## 2020-04-20 ENCOUNTER — Ambulatory Visit
Admission: RE | Admit: 2020-04-20 | Discharge: 2020-04-20 | Disposition: A | Discharge status: Home / Self Care | Payer: Medicare HMO | Source: Ambulatory Visit | Attending: Radiation Oncology | Admitting: Radiation Oncology

## 2020-04-20 ENCOUNTER — Inpatient Hospital Stay (HOSPITAL_BASED_OUTPATIENT_CLINIC_OR_DEPARTMENT_OTHER): Payer: Medicare HMO | Admitting: Hematology & Oncology

## 2020-04-20 ENCOUNTER — Encounter: Payer: Self-pay | Admitting: Hematology & Oncology

## 2020-04-20 VITALS — BP 141/68 | HR 88 | Temp 98.3°F | Resp 18

## 2020-04-20 DIAGNOSIS — R600 Localized edema: Secondary | ICD-10-CM

## 2020-04-20 DIAGNOSIS — Z7982 Long term (current) use of aspirin: Secondary | ICD-10-CM | POA: Diagnosis not present

## 2020-04-20 DIAGNOSIS — D500 Iron deficiency anemia secondary to blood loss (chronic): Secondary | ICD-10-CM | POA: Diagnosis not present

## 2020-04-20 DIAGNOSIS — Z95828 Presence of other vascular implants and grafts: Secondary | ICD-10-CM

## 2020-04-20 DIAGNOSIS — C182 Malignant neoplasm of ascending colon: Secondary | ICD-10-CM | POA: Diagnosis not present

## 2020-04-20 DIAGNOSIS — C189 Malignant neoplasm of colon, unspecified: Secondary | ICD-10-CM

## 2020-04-20 DIAGNOSIS — Z23 Encounter for immunization: Secondary | ICD-10-CM | POA: Diagnosis not present

## 2020-04-20 DIAGNOSIS — C787 Secondary malignant neoplasm of liver and intrahepatic bile duct: Secondary | ICD-10-CM

## 2020-04-20 DIAGNOSIS — Z510 Encounter for antineoplastic radiation therapy: Secondary | ICD-10-CM | POA: Diagnosis not present

## 2020-04-20 DIAGNOSIS — Z86711 Personal history of pulmonary embolism: Secondary | ICD-10-CM | POA: Diagnosis not present

## 2020-04-20 DIAGNOSIS — Z86718 Personal history of other venous thrombosis and embolism: Secondary | ICD-10-CM | POA: Diagnosis not present

## 2020-04-20 DIAGNOSIS — Z79899 Other long term (current) drug therapy: Secondary | ICD-10-CM | POA: Diagnosis not present

## 2020-04-20 LAB — CBC WITH DIFFERENTIAL (CANCER CENTER ONLY)
Abs Immature Granulocytes: 0.04 10*3/uL (ref 0.00–0.07)
Basophils Absolute: 0 10*3/uL (ref 0.0–0.1)
Basophils Relative: 0 %
Eosinophils Absolute: 0.1 10*3/uL (ref 0.0–0.5)
Eosinophils Relative: 1 %
HCT: 31.2 % — ABNORMAL LOW (ref 36.0–46.0)
Hemoglobin: 9.6 g/dL — ABNORMAL LOW (ref 12.0–15.0)
Immature Granulocytes: 1 %
Lymphocytes Relative: 12 %
Lymphs Abs: 1 10*3/uL (ref 0.7–4.0)
MCH: 27 pg (ref 26.0–34.0)
MCHC: 30.8 g/dL (ref 30.0–36.0)
MCV: 87.6 fL (ref 80.0–100.0)
Monocytes Absolute: 0.6 10*3/uL (ref 0.1–1.0)
Monocytes Relative: 8 %
Neutro Abs: 6.4 10*3/uL (ref 1.7–7.7)
Neutrophils Relative %: 78 %
Platelet Count: 172 10*3/uL (ref 150–400)
RBC: 3.56 MIL/uL — ABNORMAL LOW (ref 3.87–5.11)
RDW: 15.3 % (ref 11.5–15.5)
WBC Count: 8.1 10*3/uL (ref 4.0–10.5)
nRBC: 0 % (ref 0.0–0.2)

## 2020-04-20 LAB — CMP (CANCER CENTER ONLY)
ALT: 12 U/L (ref 0–44)
AST: 22 U/L (ref 15–41)
Albumin: 3.5 g/dL (ref 3.5–5.0)
Alkaline Phosphatase: 79 U/L (ref 38–126)
Anion gap: 6 (ref 5–15)
BUN: 16 mg/dL (ref 8–23)
CO2: 28 mmol/L (ref 22–32)
Calcium: 9.4 mg/dL (ref 8.9–10.3)
Chloride: 108 mmol/L (ref 98–111)
Creatinine: 0.81 mg/dL (ref 0.44–1.00)
GFR, Estimated: >60 mL/min (ref >60)
Glucose, Bld: 113 mg/dL — ABNORMAL HIGH (ref 70–99)
Potassium: 3.9 mmol/L (ref 3.5–5.1)
Sodium: 142 mmol/L (ref 135–145)
Total Bilirubin: 0.5 mg/dL (ref 0.3–1.2)
Total Protein: 5.8 g/dL — ABNORMAL LOW (ref 6.5–8.1)

## 2020-04-20 LAB — SAMPLE TO BLOOD BANK

## 2020-04-20 MED ORDER — SODIUM CHLORIDE 0.9% FLUSH
10.0000 mL | Freq: Once | INTRAVENOUS | Status: AC
  Administered 2020-04-20: 10 mL via INTRAVENOUS
  Filled 2020-04-20: qty 10

## 2020-04-20 MED ORDER — HEPARIN SOD (PORK) LOCK FLUSH 100 UNIT/ML IV SOLN
500.0000 [IU] | Freq: Once | INTRAVENOUS | Status: AC
  Administered 2020-04-20: 500 [IU] via INTRAVENOUS
  Filled 2020-04-20: qty 5

## 2020-04-20 NOTE — Patient Instructions (Signed)

## 2020-04-20 NOTE — Progress Notes (Signed)
Hematology and Oncology Follow Up Visit  Tonya [name] [id number] 1940/06/20 79 y.o. 04/20/2020   Principle Diagnosis:  Stage IV adenocarcinoma of the ascending colon -- NRAS+, BRAF-, HER2-, MSI/MMR -,  Iron deficiency anemia DVT in LEFT leg-- Peroneal and popliteal vein  Past Therapy: FOLFOX - s/p cycle5  Current Therapy: FOLFIRI/Bevacizumab -started 01/20/2019,s/p cycle #16 -- Avastin d/c'ed on 10/21/2019 due to DVT IV Iron as indicated Eliquis 5 mg po BID -- started on 09/30/2019 --DC on 04/01/2020 secondary to GI bleeding Xeloda with radiation therapy.  To start on 04/08/2020.  Patient will take 1500 mg of Xeloda daily.   Interim History:  Tonya [name] is here today with her daughter for follow-up.  She is little tired.  She has had 8 radiation treatments so far.  Her blood sugars have been on the lower side.  I told her to cut back a little bit on the insulin.  I told her to take 20 units a day of the insulin.  I would much rather have her blood sugars around 150-200.  She has had no problems with the Xeloda.  She takes 1500 mg daily.  She has had no nausea or vomiting.  She has had no obvious bleeding.  Is been no melena or bright red blood per rectum.  She is off anticoagulation right now.  Her left leg is certainly swollen.  Unfortunately we can do nothing about this.  Hopefully we can get her back on anticoagulation we see that her blood count continues to improve.  I think this would be a good sign that the radiation is working.  She had a very quiet Christmas.  She is going to have a very quiet New Year's.  There has been no problems with pain.  She still wears the boot on the right lower leg.  She really has to go back to the orthopedist.  She has a problem getting to the orthopedist because of transportation issues.  There has been no problems with fever.  She has had no cough.  Overall, I would say her performance status is ECOG 2.   Medications:  Allergies  as of 04/20/2020      Reactions   Hydrocodone Swelling, Other (See Comments)   "I started swelling, became red, and passed out"   Iohexol Hives, Shortness Of Breath, Other (See Comments)   Patient developed hives and fullness in throat post injection of 125cc's Omni 300, Onset Date: 11/15/2006   Naproxen Other (See Comments)   "It made me feel out of my head"   Ibuprofen Nausea Only   Propoxyphene N-acetaminophen Other (See Comments)   "I couldn't find the door to make my way out of the room- I was in misery"      Medication List       Accurate as of April 20, 2020  2:45 PM. If you have any questions, ask your nurse or doctor.        amLODipine 10 MG tablet Commonly known as: NORVASC   apixaban 5 MG Tabs tablet Commonly known as: ELIQUIS Take 1 tablet by mouth 2 (two) times daily.   aspirin 81 MG EC tablet 1 tablet   atorvastatin 10 MG tablet Commonly known as: LIPITOR Take 1 tablet (10 mg total) by mouth daily.   B-D SINGLE USE SWABS REGULAR Pads 1 each by Does not apply route as needed (to cleanse site prior to obtaining droplet of blood for CBG's). E11.9   B-D ULTRAFINE III SHORT PEN  31G X 8 MM Misc Generic drug: Insulin Pen Needle   capecitabine 500 MG tablet Commonly known as: XELODA Take 3 tablets (1,500 mg total) by mouth daily. Start taking on 04/06/2020.  Take Monday thru Friday for 6 weeks   dexamethasone 4 MG tablet Commonly known as: DECADRON Take 2 tablets (8 mg total) by mouth 2 (two) times daily. For four days following chemotherapy.   famotidine 40 MG tablet Commonly known as: Pepcid Take 1 tablet (40 mg total) by mouth 2 (two) times daily.   ketoconazole 2 % cream Commonly known as: NIZORAL Apply 1 application topically 2 (two) times daily.   lidocaine-prilocaine cream Commonly known as: EMLA Apply 1 application topically as needed for up to 30 doses.   loperamide 2 MG tablet Commonly known as: Imodium A-D Take 2 tablets (4 mg total)  by mouth 4 (four) times daily as needed. Take 2 at diarrhea onset , then 1 every 2hr until 12hrs with no BM. May take 2 every 4hrs at night. If diarrhea recurs repeat.   magic mouthwash Soln Swish and swallow equal parts Benadryl, Lidocaine and Nystatin.  5- 10 ml.'s four times a day as needed.   meclizine 12.5 MG tablet Commonly known as: ANTIVERT TAKE 1 TABLET(12.5 MG) BY MOUTH THREE TIMES DAILY AS NEEDED FOR DIZZINESS   metoprolol succinate 25 MG 24 hr tablet Commonly known as: TOPROL-XL 1 tablet   metoprolol tartrate 25 MG tablet Commonly known as: LOPRESSOR Take 1 tablet (25 mg total) by mouth 2 (two) times daily.   metroNIDAZOLE 500 MG tablet Commonly known as: FLAGYL Take 500 mg by mouth as needed.   neomycin 500 MG tablet Commonly known as: MYCIFRADIN Take 1,000 mg by mouth 3 (three) times daily.   olmesartan 20 MG tablet Commonly known as: BENICAR 1 tablet   terbinafine 1 % cream Commonly known as: LAMISIL Apply topically 2 (two) times daily. PRN   traMADol 50 MG tablet Commonly known as: ULTRAM TAKE 1 TABLET(50 MG) BY MOUTH EVERY 6 HOURS AS NEEDED   Tresiba FlexTouch 100 UNIT/ML FlexTouch Pen Generic drug: insulin degludec Inject 0.21 mLs (21 Units total) into the skin daily. Take 34 units on chemo days   True Metrix Blood Glucose Test test strip Generic drug: glucose blood TEST BLOOD SUGAR TWICE DAILY   TRUEplus Lancets 30G Misc 1 each by Does not apply route 2 (two) times daily. E11.9   vitamin B-12 500 MCG tablet Commonly known as: CYANOCOBALAMIN Take 500 mcg by mouth daily.       Allergies:  Allergies  Allergen Reactions  . Hydrocodone Swelling and Other (See Comments)    "I started swelling, became red, and passed out"  . Iohexol Hives, Shortness Of Breath and Other (See Comments)    Patient developed hives and fullness in throat post injection of 125cc's Omni 300, Onset Date: 11/15/2006   . Naproxen Other (See Comments)    "It made me  feel out of my head"  . Ibuprofen Nausea Only  . Propoxyphene N-Acetaminophen Other (See Comments)    "I couldn't find the door to make my way out of the room- I was in misery"    Past Medical History, Surgical history, Social history, and Family History were reviewed and updated.  Review of Systems: Review of Systems  Constitutional: Negative.   HENT: Negative.   Eyes: Negative.   Respiratory: Negative.   Cardiovascular: Negative.   Gastrointestinal: Negative.   Genitourinary: Negative.   Musculoskeletal: Negative.  Skin: Negative.   Neurological: Negative.   Endo/Heme/Allergies: Negative.   Psychiatric/Behavioral: Negative.      Physical Exam:  oral temperature is 98.3 F (36.8 C). Her blood pressure is 141/68 (abnormal) and her pulse is 88. Her respiration is 18 and oxygen saturation is 100%.   Wt Readings from Last 3 Encounters:  02/16/20 182 lb (82.6 kg)  01/29/20 187 lb 12.8 oz (85.2 kg)  12/15/19 184 lb (83.5 kg)    Physical Exam Vitals reviewed.  HENT:     Head: Normocephalic and atraumatic.  Eyes:     Pupils: Pupils are equal, round, and reactive to light.  Cardiovascular:     Rate and Rhythm: Normal rate and regular rhythm.     Heart sounds: Normal heart sounds.  Pulmonary:     Effort: Pulmonary effort is normal.     Breath sounds: Normal breath sounds.  Abdominal:     General: Bowel sounds are normal.     Palpations: Abdomen is soft.  Musculoskeletal:        General: No tenderness or deformity. Normal range of motion.     Cervical back: Normal range of motion.     Comments: She has a brace on the right lower leg.  Lymphadenopathy:     Cervical: No cervical adenopathy.  Skin:    General: Skin is warm and dry.     Findings: No erythema or rash.  Neurological:     Mental Status: She is alert and oriented to person, place, and time.     Comments: She has some chronic weakness over on the left side from past stroke.  Psychiatric:        Behavior:  Behavior normal.        Thought Content: Thought content normal.        Judgment: Judgment normal.      Lab Results  Component Value Date   WBC 8.1 04/20/2020   HGB 9.6 (L) 04/20/2020   HCT 31.2 (L) 04/20/2020   MCV 87.6 04/20/2020   PLT 172 04/20/2020   Lab Results  Component Value Date   FERRITIN 361 (H) 03/31/2020   IRON 23 (L) 03/31/2020   TIBC 219 (L) 03/31/2020   UIBC 196 03/31/2020   IRONPCTSAT 10 (L) 03/31/2020   Lab Results  Component Value Date   RETICCTPCT 2.2 02/26/2020   RBC 3.56 (L) 04/20/2020   RETICCTABS 62,400 05/17/2016   No results found for: KPAFRELGTCHN, LAMBDASER, KAPLAMBRATIO No results found for: IGGSERUM, IGA, IGMSERUM No results found for: Odetta Pink, SPEI   Chemistry      Component Value Date/Time   NA 142 04/20/2020 1320   NA 139 09/30/2018 1311   K 3.9 04/20/2020 1320   CL 108 04/20/2020 1320   CO2 28 04/20/2020 1320   BUN 16 04/20/2020 1320   BUN 10 09/30/2018 1311   CREATININE 0.81 04/20/2020 1320      Component Value Date/Time   CALCIUM 9.4 04/20/2020 1320   ALKPHOS 79 04/20/2020 1320   AST 22 04/20/2020 1320   ALT 12 04/20/2020 1320   BILITOT 0.5 04/20/2020 1320       Impression and Plan: Ms. Dawe is a very pleasant 79 yo African American female with metastatic colon cancer.  Again, she does have progressive disease.  She has done quite well with the FOLFIRI regimen.    Hopefully, she will be able to get through radiation.  I really think that she could get  through.  Again I know she has a lot of support from her family which is nice to see.  We will plan to get her back in about 3 weeks.  She will be halfway through radiation by then.  Maybe, if her hemoglobin is higher, we can think about getting her back on anticoagulation.  When I see her back, I would like to do a Doppler of her left leg.  We will see what her iron studies look like.  I do not think she needs  to be transfused with blood.   [name], MD 12/28/20212:45 PM

## 2020-04-21 ENCOUNTER — Ambulatory Visit
Admission: RE | Admit: 2020-04-21 | Discharge: 2020-04-21 | Disposition: A | Discharge status: Home / Self Care | Payer: Medicare HMO | Source: Ambulatory Visit | Attending: Radiation Oncology | Admitting: Radiation Oncology

## 2020-04-21 ENCOUNTER — Telehealth: Payer: Self-pay

## 2020-04-21 DIAGNOSIS — E1169 Type 2 diabetes mellitus with other specified complication: Secondary | ICD-10-CM | POA: Diagnosis not present

## 2020-04-21 DIAGNOSIS — D630 Anemia in neoplastic disease: Secondary | ICD-10-CM | POA: Diagnosis not present

## 2020-04-21 DIAGNOSIS — C189 Malignant neoplasm of colon, unspecified: Secondary | ICD-10-CM | POA: Diagnosis not present

## 2020-04-21 DIAGNOSIS — C182 Malignant neoplasm of ascending colon: Secondary | ICD-10-CM | POA: Diagnosis not present

## 2020-04-21 DIAGNOSIS — I10 Essential (primary) hypertension: Secondary | ICD-10-CM | POA: Diagnosis not present

## 2020-04-21 DIAGNOSIS — D500 Iron deficiency anemia secondary to blood loss (chronic): Secondary | ICD-10-CM | POA: Diagnosis not present

## 2020-04-21 DIAGNOSIS — E785 Hyperlipidemia, unspecified: Secondary | ICD-10-CM | POA: Diagnosis not present

## 2020-04-21 DIAGNOSIS — Z510 Encounter for antineoplastic radiation therapy: Secondary | ICD-10-CM | POA: Diagnosis not present

## 2020-04-21 LAB — IRON AND TIBC
Iron: 24 ug/dL — ABNORMAL LOW (ref 41–142)
Saturation Ratios: 11 % — ABNORMAL LOW (ref 21–57)
TIBC: 218 ug/dL — ABNORMAL LOW (ref 236–444)
UIBC: 193 ug/dL (ref 120–384)

## 2020-04-21 LAB — FERRITIN: Ferritin: 579 ng/mL — ABNORMAL HIGH (ref 11–307)

## 2020-04-21 LAB — CEA (IN HOUSE-CHCC): CEA (CHCC-In House): 13.3 ng/mL — ABNORMAL HIGH (ref 0.00–5.00)

## 2020-04-21 NOTE — Telephone Encounter (Signed)
aware of iv iron appt needed per 04/21/20 inbasket message/////aom

## 2020-04-22 ENCOUNTER — Ambulatory Visit
Admission: RE | Admit: 2020-04-22 | Discharge: 2020-04-22 | Disposition: A | Discharge status: Home / Self Care | Payer: Medicare HMO | Source: Ambulatory Visit | Attending: Radiation Oncology | Admitting: Radiation Oncology

## 2020-04-22 DIAGNOSIS — Z510 Encounter for antineoplastic radiation therapy: Secondary | ICD-10-CM | POA: Diagnosis not present

## 2020-04-22 DIAGNOSIS — C182 Malignant neoplasm of ascending colon: Secondary | ICD-10-CM | POA: Diagnosis not present

## 2020-04-25 ENCOUNTER — Ambulatory Visit: Payer: Medicare HMO

## 2020-04-25 DIAGNOSIS — Z993 Dependence on wheelchair: Secondary | ICD-10-CM | POA: Diagnosis not present

## 2020-04-25 DIAGNOSIS — E119 Type 2 diabetes mellitus without complications: Secondary | ICD-10-CM | POA: Diagnosis not present

## 2020-04-25 DIAGNOSIS — S82831D Other fracture of upper and lower end of right fibula, subsequent encounter for closed fracture with routine healing: Secondary | ICD-10-CM | POA: Diagnosis not present

## 2020-04-25 DIAGNOSIS — Z86718 Personal history of other venous thrombosis and embolism: Secondary | ICD-10-CM | POA: Diagnosis not present

## 2020-04-25 DIAGNOSIS — I4891 Unspecified atrial fibrillation: Secondary | ICD-10-CM | POA: Diagnosis not present

## 2020-04-25 DIAGNOSIS — Z9181 History of falling: Secondary | ICD-10-CM | POA: Diagnosis not present

## 2020-04-25 DIAGNOSIS — Z794 Long term (current) use of insulin: Secondary | ICD-10-CM | POA: Diagnosis not present

## 2020-04-25 DIAGNOSIS — I10 Essential (primary) hypertension: Secondary | ICD-10-CM | POA: Diagnosis not present

## 2020-04-25 DIAGNOSIS — I69354 Hemiplegia and hemiparesis following cerebral infarction affecting left non-dominant side: Secondary | ICD-10-CM | POA: Diagnosis not present

## 2020-04-26 ENCOUNTER — Ambulatory Visit: Payer: Medicare HMO

## 2020-04-27 ENCOUNTER — Other Ambulatory Visit: Payer: Self-pay

## 2020-04-27 ENCOUNTER — Ambulatory Visit
Admission: RE | Admit: 2020-04-27 | Discharge: 2020-04-27 | Disposition: A | Discharge status: Home / Self Care | Payer: Medicare HMO | Source: Ambulatory Visit | Attending: Radiation Oncology | Admitting: Radiation Oncology

## 2020-04-27 DIAGNOSIS — C182 Malignant neoplasm of ascending colon: Secondary | ICD-10-CM | POA: Diagnosis not present

## 2020-04-27 DIAGNOSIS — Z510 Encounter for antineoplastic radiation therapy: Secondary | ICD-10-CM | POA: Insufficient documentation

## 2020-04-28 ENCOUNTER — Ambulatory Visit
Admission: RE | Admit: 2020-04-28 | Discharge: 2020-04-28 | Disposition: A | Discharge status: Home / Self Care | Payer: Medicare HMO | Source: Ambulatory Visit | Attending: Radiation Oncology | Admitting: Radiation Oncology

## 2020-04-28 DIAGNOSIS — Z510 Encounter for antineoplastic radiation therapy: Secondary | ICD-10-CM | POA: Diagnosis not present

## 2020-04-28 DIAGNOSIS — C182 Malignant neoplasm of ascending colon: Secondary | ICD-10-CM | POA: Diagnosis not present

## 2020-04-29 ENCOUNTER — Inpatient Hospital Stay: Payer: Medicare HMO | Attending: Hematology & Oncology

## 2020-04-29 ENCOUNTER — Other Ambulatory Visit: Payer: Self-pay

## 2020-04-29 ENCOUNTER — Ambulatory Visit
Admission: RE | Admit: 2020-04-29 | Discharge: 2020-04-29 | Disposition: A | Discharge status: Home / Self Care | Payer: Medicare HMO | Source: Ambulatory Visit | Attending: Radiation Oncology | Admitting: Radiation Oncology

## 2020-04-29 VITALS — BP 126/57 | HR 77 | Temp 98.1°F | Resp 20

## 2020-04-29 DIAGNOSIS — Z86711 Personal history of pulmonary embolism: Secondary | ICD-10-CM | POA: Insufficient documentation

## 2020-04-29 DIAGNOSIS — Z79899 Other long term (current) drug therapy: Secondary | ICD-10-CM | POA: Insufficient documentation

## 2020-04-29 DIAGNOSIS — C182 Malignant neoplasm of ascending colon: Secondary | ICD-10-CM | POA: Diagnosis not present

## 2020-04-29 DIAGNOSIS — D500 Iron deficiency anemia secondary to blood loss (chronic): Secondary | ICD-10-CM

## 2020-04-29 DIAGNOSIS — Z510 Encounter for antineoplastic radiation therapy: Secondary | ICD-10-CM | POA: Diagnosis not present

## 2020-04-29 DIAGNOSIS — Z7901 Long term (current) use of anticoagulants: Secondary | ICD-10-CM | POA: Insufficient documentation

## 2020-04-29 MED ORDER — SODIUM CHLORIDE 0.9% FLUSH
10.0000 mL | INTRAVENOUS | Status: DC | PRN
  Administered 2020-04-29: 10 mL
  Filled 2020-04-29: qty 10

## 2020-04-29 MED ORDER — SODIUM CHLORIDE 0.9 % IV SOLN
Freq: Once | INTRAVENOUS | Status: AC
  Filled 2020-04-29: qty 250

## 2020-04-29 MED ORDER — SODIUM CHLORIDE 0.9 % IV SOLN
200.0000 mg | Freq: Once | INTRAVENOUS | Status: AC
  Administered 2020-04-29: 200 mg via INTRAVENOUS
  Filled 2020-04-29: qty 200

## 2020-04-29 MED ORDER — HEPARIN SOD (PORK) LOCK FLUSH 100 UNIT/ML IV SOLN
500.0000 [IU] | Freq: Once | INTRAVENOUS | Status: AC | PRN
  Administered 2020-04-29: 500 [IU]
  Filled 2020-04-29: qty 5

## 2020-04-29 NOTE — Patient Instructions (Signed)

## 2020-04-30 ENCOUNTER — Ambulatory Visit: Payer: Medicare HMO

## 2020-04-30 ENCOUNTER — Other Ambulatory Visit: Payer: Self-pay | Admitting: Hematology & Oncology

## 2020-04-30 ENCOUNTER — Ambulatory Visit (HOSPITAL_BASED_OUTPATIENT_CLINIC_OR_DEPARTMENT_OTHER): Payer: Medicare HMO

## 2020-04-30 ENCOUNTER — Ambulatory Visit (INDEPENDENT_AMBULATORY_CARE_PROVIDER_SITE_OTHER): Payer: Medicare HMO

## 2020-04-30 ENCOUNTER — Ambulatory Visit
Admission: RE | Admit: 2020-04-30 | Discharge: 2020-04-30 | Disposition: A | Discharge status: Home / Self Care | Payer: Medicare HMO | Source: Ambulatory Visit | Attending: Radiation Oncology | Admitting: Radiation Oncology

## 2020-04-30 ENCOUNTER — Telehealth: Payer: Self-pay

## 2020-04-30 DIAGNOSIS — I82433 Acute embolism and thrombosis of popliteal vein, bilateral: Secondary | ICD-10-CM

## 2020-04-30 DIAGNOSIS — I82890 Acute embolism and thrombosis of other specified veins: Secondary | ICD-10-CM | POA: Diagnosis not present

## 2020-04-30 DIAGNOSIS — R609 Edema, unspecified: Secondary | ICD-10-CM

## 2020-04-30 DIAGNOSIS — I82443 Acute embolism and thrombosis of tibial vein, bilateral: Secondary | ICD-10-CM | POA: Diagnosis not present

## 2020-04-30 DIAGNOSIS — I82413 Acute embolism and thrombosis of femoral vein, bilateral: Secondary | ICD-10-CM

## 2020-04-30 DIAGNOSIS — I82402 Acute embolism and thrombosis of unspecified deep veins of left lower extremity: Secondary | ICD-10-CM

## 2020-04-30 DIAGNOSIS — C182 Malignant neoplasm of ascending colon: Secondary | ICD-10-CM | POA: Diagnosis not present

## 2020-04-30 DIAGNOSIS — Z510 Encounter for antineoplastic radiation therapy: Secondary | ICD-10-CM | POA: Diagnosis not present

## 2020-04-30 MED ORDER — APIXABAN 5 MG PO TABS: 5.0000 mg | ORAL_TABLET | Freq: Two times a day (BID) | ORAL | 1 refills | Status: DC

## 2020-04-30 NOTE — Telephone Encounter (Signed)
Called and informed patient of [place] results per MD, Per MD patient to restart Eliquis 5mg  BID and follow up as scheduled on 05/13/20. Informed patients daughter of information, new rx sent to [facility], she states she has enough for the week until [place] sends a new one in. She denies any questions or concerns at this time.

## 2020-05-01 ENCOUNTER — Ambulatory Visit: Payer: Medicare HMO

## 2020-05-03 ENCOUNTER — Ambulatory Visit
Admission: RE | Admit: 2020-05-03 | Discharge: 2020-05-03 | Disposition: A | Discharge status: Home / Self Care | Payer: Medicare HMO | Source: Ambulatory Visit | Attending: Radiation Oncology | Admitting: Radiation Oncology

## 2020-05-03 DIAGNOSIS — D500 Iron deficiency anemia secondary to blood loss (chronic): Secondary | ICD-10-CM | POA: Diagnosis not present

## 2020-05-03 DIAGNOSIS — E785 Hyperlipidemia, unspecified: Secondary | ICD-10-CM | POA: Diagnosis not present

## 2020-05-03 DIAGNOSIS — E1169 Type 2 diabetes mellitus with other specified complication: Secondary | ICD-10-CM | POA: Diagnosis not present

## 2020-05-03 DIAGNOSIS — I10 Essential (primary) hypertension: Secondary | ICD-10-CM | POA: Diagnosis not present

## 2020-05-03 DIAGNOSIS — C182 Malignant neoplasm of ascending colon: Secondary | ICD-10-CM | POA: Diagnosis not present

## 2020-05-03 DIAGNOSIS — C189 Malignant neoplasm of colon, unspecified: Secondary | ICD-10-CM | POA: Diagnosis not present

## 2020-05-03 DIAGNOSIS — Z510 Encounter for antineoplastic radiation therapy: Secondary | ICD-10-CM | POA: Diagnosis not present

## 2020-05-03 DIAGNOSIS — D630 Anemia in neoplastic disease: Secondary | ICD-10-CM | POA: Diagnosis not present

## 2020-05-03 MED FILL — CAPECITABINE 500 MG TABLET: 500 | 14 days supply | Qty: 30 | Fill #1

## 2020-05-04 ENCOUNTER — Ambulatory Visit
Admission: RE | Admit: 2020-05-04 | Discharge: 2020-05-04 | Disposition: A | Discharge status: Home / Self Care | Payer: Medicare HMO | Source: Ambulatory Visit | Attending: Radiation Oncology | Admitting: Radiation Oncology

## 2020-05-04 DIAGNOSIS — Z86718 Personal history of other venous thrombosis and embolism: Secondary | ICD-10-CM | POA: Diagnosis not present

## 2020-05-04 DIAGNOSIS — Z510 Encounter for antineoplastic radiation therapy: Secondary | ICD-10-CM | POA: Diagnosis not present

## 2020-05-04 DIAGNOSIS — I69354 Hemiplegia and hemiparesis following cerebral infarction affecting left non-dominant side: Secondary | ICD-10-CM | POA: Diagnosis not present

## 2020-05-04 DIAGNOSIS — C182 Malignant neoplasm of ascending colon: Secondary | ICD-10-CM | POA: Diagnosis not present

## 2020-05-04 DIAGNOSIS — Z9181 History of falling: Secondary | ICD-10-CM | POA: Diagnosis not present

## 2020-05-04 DIAGNOSIS — I4891 Unspecified atrial fibrillation: Secondary | ICD-10-CM | POA: Diagnosis not present

## 2020-05-04 DIAGNOSIS — S82831D Other fracture of upper and lower end of right fibula, subsequent encounter for closed fracture with routine healing: Secondary | ICD-10-CM | POA: Diagnosis not present

## 2020-05-04 DIAGNOSIS — Z993 Dependence on wheelchair: Secondary | ICD-10-CM | POA: Diagnosis not present

## 2020-05-04 DIAGNOSIS — Z794 Long term (current) use of insulin: Secondary | ICD-10-CM | POA: Diagnosis not present

## 2020-05-04 DIAGNOSIS — E119 Type 2 diabetes mellitus without complications: Secondary | ICD-10-CM | POA: Diagnosis not present

## 2020-05-04 DIAGNOSIS — I10 Essential (primary) hypertension: Secondary | ICD-10-CM | POA: Diagnosis not present

## 2020-05-05 ENCOUNTER — Ambulatory Visit
Admission: RE | Admit: 2020-05-05 | Discharge: 2020-05-05 | Disposition: A | Discharge status: Home / Self Care | Payer: Medicare HMO | Source: Ambulatory Visit | Attending: Radiation Oncology | Admitting: Radiation Oncology

## 2020-05-05 DIAGNOSIS — I4891 Unspecified atrial fibrillation: Secondary | ICD-10-CM | POA: Diagnosis not present

## 2020-05-05 DIAGNOSIS — E119 Type 2 diabetes mellitus without complications: Secondary | ICD-10-CM | POA: Diagnosis not present

## 2020-05-05 DIAGNOSIS — Z510 Encounter for antineoplastic radiation therapy: Secondary | ICD-10-CM | POA: Diagnosis not present

## 2020-05-05 DIAGNOSIS — Z9181 History of falling: Secondary | ICD-10-CM | POA: Diagnosis not present

## 2020-05-05 DIAGNOSIS — S82831D Other fracture of upper and lower end of right fibula, subsequent encounter for closed fracture with routine healing: Secondary | ICD-10-CM | POA: Diagnosis not present

## 2020-05-05 DIAGNOSIS — I10 Essential (primary) hypertension: Secondary | ICD-10-CM | POA: Diagnosis not present

## 2020-05-05 DIAGNOSIS — I69354 Hemiplegia and hemiparesis following cerebral infarction affecting left non-dominant side: Secondary | ICD-10-CM | POA: Diagnosis not present

## 2020-05-05 DIAGNOSIS — Z86718 Personal history of other venous thrombosis and embolism: Secondary | ICD-10-CM | POA: Diagnosis not present

## 2020-05-05 DIAGNOSIS — Z794 Long term (current) use of insulin: Secondary | ICD-10-CM | POA: Diagnosis not present

## 2020-05-05 DIAGNOSIS — C182 Malignant neoplasm of ascending colon: Secondary | ICD-10-CM | POA: Diagnosis not present

## 2020-05-05 DIAGNOSIS — Z993 Dependence on wheelchair: Secondary | ICD-10-CM | POA: Diagnosis not present

## 2020-05-06 ENCOUNTER — Ambulatory Visit
Admission: RE | Admit: 2020-05-06 | Discharge: 2020-05-06 | Disposition: A | Discharge status: Home / Self Care | Payer: Medicare HMO | Source: Ambulatory Visit | Attending: Radiation Oncology | Admitting: Radiation Oncology

## 2020-05-06 DIAGNOSIS — C182 Malignant neoplasm of ascending colon: Secondary | ICD-10-CM | POA: Diagnosis not present

## 2020-05-06 DIAGNOSIS — Z510 Encounter for antineoplastic radiation therapy: Secondary | ICD-10-CM | POA: Diagnosis not present

## 2020-05-07 ENCOUNTER — Ambulatory Visit
Admission: RE | Admit: 2020-05-07 | Discharge: 2020-05-07 | Disposition: A | Discharge status: Home / Self Care | Payer: Medicare HMO | Source: Ambulatory Visit | Attending: Radiation Oncology | Admitting: Radiation Oncology

## 2020-05-07 DIAGNOSIS — M25571 Pain in right ankle and joints of right foot: Secondary | ICD-10-CM | POA: Diagnosis not present

## 2020-05-07 DIAGNOSIS — C182 Malignant neoplasm of ascending colon: Secondary | ICD-10-CM | POA: Diagnosis not present

## 2020-05-07 DIAGNOSIS — R6889 Other general symptoms and signs: Secondary | ICD-10-CM | POA: Diagnosis not present

## 2020-05-07 DIAGNOSIS — M25561 Pain in right knee: Secondary | ICD-10-CM | POA: Diagnosis not present

## 2020-05-07 DIAGNOSIS — Z510 Encounter for antineoplastic radiation therapy: Secondary | ICD-10-CM | POA: Diagnosis not present

## 2020-05-08 ENCOUNTER — Ambulatory Visit: Payer: Medicare HMO

## 2020-05-08 ENCOUNTER — Ambulatory Visit: Payer: Medicare HMO | Admitting: Radiation Oncology

## 2020-05-10 ENCOUNTER — Ambulatory Visit: Payer: Medicare HMO

## 2020-05-11 ENCOUNTER — Telehealth: Payer: Self-pay

## 2020-05-11 ENCOUNTER — Ambulatory Visit
Admission: RE | Admit: 2020-05-11 | Discharge: 2020-05-11 | Disposition: A | Discharge status: Home / Self Care | Payer: Medicare HMO | Source: Ambulatory Visit | Attending: Radiation Oncology | Admitting: Radiation Oncology

## 2020-05-11 DIAGNOSIS — C182 Malignant neoplasm of ascending colon: Secondary | ICD-10-CM | POA: Diagnosis not present

## 2020-05-11 DIAGNOSIS — R6889 Other general symptoms and signs: Secondary | ICD-10-CM | POA: Diagnosis not present

## 2020-05-11 DIAGNOSIS — Z510 Encounter for antineoplastic radiation therapy: Secondary | ICD-10-CM | POA: Diagnosis not present

## 2020-05-11 NOTE — Telephone Encounter (Signed)
Called pt and moved 1-20 appts to 1-19 per tonya-message   aom

## 2020-05-12 ENCOUNTER — Telehealth: Payer: Self-pay

## 2020-05-12 ENCOUNTER — Other Ambulatory Visit: Payer: Medicare HMO

## 2020-05-12 ENCOUNTER — Inpatient Hospital Stay: Payer: Medicare HMO

## 2020-05-12 ENCOUNTER — Inpatient Hospital Stay (HOSPITAL_BASED_OUTPATIENT_CLINIC_OR_DEPARTMENT_OTHER): Payer: Medicare HMO | Admitting: Hematology & Oncology

## 2020-05-12 ENCOUNTER — Encounter: Payer: Self-pay | Admitting: Hematology & Oncology

## 2020-05-12 ENCOUNTER — Ambulatory Visit: Payer: Medicare HMO | Admitting: Hematology & Oncology

## 2020-05-12 ENCOUNTER — Other Ambulatory Visit: Payer: Self-pay | Admitting: *Deleted

## 2020-05-12 ENCOUNTER — Other Ambulatory Visit: Payer: Self-pay

## 2020-05-12 ENCOUNTER — Ambulatory Visit
Admission: RE | Admit: 2020-05-12 | Discharge: 2020-05-12 | Disposition: A | Discharge status: Home / Self Care | Payer: Medicare HMO | Source: Ambulatory Visit | Attending: Radiation Oncology | Admitting: Radiation Oncology

## 2020-05-12 VITALS — BP 135/60 | HR 76 | Temp 98.5°F | Resp 20

## 2020-05-12 DIAGNOSIS — Z794 Long term (current) use of insulin: Secondary | ICD-10-CM | POA: Diagnosis not present

## 2020-05-12 DIAGNOSIS — C182 Malignant neoplasm of ascending colon: Secondary | ICD-10-CM | POA: Diagnosis not present

## 2020-05-12 DIAGNOSIS — I82402 Acute embolism and thrombosis of unspecified deep veins of left lower extremity: Secondary | ICD-10-CM

## 2020-05-12 DIAGNOSIS — C787 Secondary malignant neoplasm of liver and intrahepatic bile duct: Secondary | ICD-10-CM | POA: Diagnosis not present

## 2020-05-12 DIAGNOSIS — J430 Unilateral pulmonary emphysema [MacLeod's syndrome]: Secondary | ICD-10-CM | POA: Diagnosis not present

## 2020-05-12 DIAGNOSIS — R6889 Other general symptoms and signs: Secondary | ICD-10-CM | POA: Diagnosis not present

## 2020-05-12 DIAGNOSIS — E1149 Type 2 diabetes mellitus with other diabetic neurological complication: Secondary | ICD-10-CM | POA: Diagnosis not present

## 2020-05-12 DIAGNOSIS — C189 Malignant neoplasm of colon, unspecified: Secondary | ICD-10-CM

## 2020-05-12 DIAGNOSIS — R600 Localized edema: Secondary | ICD-10-CM

## 2020-05-12 DIAGNOSIS — I472 Ventricular tachycardia: Secondary | ICD-10-CM

## 2020-05-12 DIAGNOSIS — R42 Dizziness and giddiness: Secondary | ICD-10-CM

## 2020-05-12 DIAGNOSIS — D500 Iron deficiency anemia secondary to blood loss (chronic): Secondary | ICD-10-CM | POA: Diagnosis not present

## 2020-05-12 DIAGNOSIS — I69359 Hemiplegia and hemiparesis following cerebral infarction affecting unspecified side: Secondary | ICD-10-CM

## 2020-05-12 DIAGNOSIS — Z510 Encounter for antineoplastic radiation therapy: Secondary | ICD-10-CM | POA: Diagnosis not present

## 2020-05-12 DIAGNOSIS — Z86718 Personal history of other venous thrombosis and embolism: Secondary | ICD-10-CM | POA: Insufficient documentation

## 2020-05-12 DIAGNOSIS — Z79899 Other long term (current) drug therapy: Secondary | ICD-10-CM | POA: Diagnosis not present

## 2020-05-12 DIAGNOSIS — Z86711 Personal history of pulmonary embolism: Secondary | ICD-10-CM | POA: Diagnosis not present

## 2020-05-12 DIAGNOSIS — Z7901 Long term (current) use of anticoagulants: Secondary | ICD-10-CM | POA: Diagnosis not present

## 2020-05-12 DIAGNOSIS — I4729 Other ventricular tachycardia: Secondary | ICD-10-CM

## 2020-05-12 LAB — CMP (CANCER CENTER ONLY)
ALT: 9 U/L (ref 0–44)
AST: 22 U/L (ref 15–41)
Albumin: 3.1 g/dL — ABNORMAL LOW (ref 3.5–5.0)
Alkaline Phosphatase: 77 U/L (ref 38–126)
Anion gap: 5 (ref 5–15)
BUN: 12 mg/dL (ref 8–23)
CO2: 26 mmol/L (ref 22–32)
Calcium: 9.1 mg/dL (ref 8.9–10.3)
Chloride: 109 mmol/L (ref 98–111)
Creatinine: 0.69 mg/dL (ref 0.44–1.00)
GFR, Estimated: >60 mL/min (ref >60)
Glucose, Bld: 98 mg/dL (ref 70–99)
Potassium: 3.6 mmol/L (ref 3.5–5.1)
Sodium: 140 mmol/L (ref 135–145)
Total Bilirubin: 0.5 mg/dL (ref 0.3–1.2)
Total Protein: 5.7 g/dL — ABNORMAL LOW (ref 6.5–8.1)

## 2020-05-12 LAB — CBC WITH DIFFERENTIAL (CANCER CENTER ONLY)
Abs Immature Granulocytes: 0.03 10*3/uL (ref 0.00–0.07)
Basophils Absolute: 0 10*3/uL (ref 0.0–0.1)
Basophils Relative: 0 %
Eosinophils Absolute: 0.1 10*3/uL (ref 0.0–0.5)
Eosinophils Relative: 1 %
HCT: 27 % — ABNORMAL LOW (ref 36.0–46.0)
Hemoglobin: 8.3 g/dL — ABNORMAL LOW (ref 12.0–15.0)
Immature Granulocytes: 1 %
Lymphocytes Relative: 8 %
Lymphs Abs: 0.5 10*3/uL — ABNORMAL LOW (ref 0.7–4.0)
MCH: 26.9 pg (ref 26.0–34.0)
MCHC: 30.7 g/dL (ref 30.0–36.0)
MCV: 87.4 fL (ref 80.0–100.0)
Monocytes Absolute: 0.5 10*3/uL (ref 0.1–1.0)
Monocytes Relative: 8 %
Neutro Abs: 4.8 10*3/uL (ref 1.7–7.7)
Neutrophils Relative %: 82 %
Platelet Count: 226 10*3/uL (ref 150–400)
RBC: 3.09 MIL/uL — ABNORMAL LOW (ref 3.87–5.11)
RDW: 17.8 % — ABNORMAL HIGH (ref 11.5–15.5)
WBC Count: 5.9 10*3/uL (ref 4.0–10.5)
nRBC: 0 % (ref 0.0–0.2)

## 2020-05-12 LAB — SAMPLE TO BLOOD BANK

## 2020-05-12 LAB — PREPARE RBC (CROSSMATCH)

## 2020-05-12 MED ORDER — POTASSIUM CHLORIDE CRYS ER 20 MEQ PO TBCR: 20.0000 meq | EXTENDED_RELEASE_TABLET | Freq: Every day | ORAL | 4 refills | Status: DC

## 2020-05-12 MED ORDER — PROCHLORPERAZINE MALEATE 10 MG PO TABS: 10.0000 mg | ORAL_TABLET | Freq: Four times a day (QID) | ORAL | 1 refills | Status: DC | PRN

## 2020-05-12 MED ORDER — METOLAZONE 5 MG PO TABS: 5.0000 mg | ORAL_TABLET | Freq: Every day | ORAL | 4 refills | Status: DC

## 2020-05-12 MED ORDER — FUROSEMIDE 40 MG PO TABS: 40.0000 mg | ORAL_TABLET | Freq: Every day | ORAL | 4 refills | Status: DC

## 2020-05-12 MED ORDER — LIDOCAINE-PRILOCAINE 2.5-2.5 % EX CREA: 1.0000 "application " | TOPICAL_CREAM | CUTANEOUS | 1 refills | Status: DC | PRN

## 2020-05-12 MED ORDER — MECLIZINE HCL 12.5 MG PO TABS: ORAL_TABLET | ORAL | 2 refills | Status: DC

## 2020-05-12 MED ORDER — APIXABAN 5 MG PO TABS: 5.0000 mg | ORAL_TABLET | Freq: Two times a day (BID) | ORAL | 1 refills | Status: DC

## 2020-05-12 NOTE — Progress Notes (Signed)
Hematology and Oncology Follow Up Visit  Tonya [name] [id number] [birth year] 80 y.o. 05/12/2020   Principle Diagnosis:  Stage IV adenocarcinoma of the ascending colon -- NRAS+, BRAF-, HER2-, MSI/MMR -,  Iron deficiency anemia DVT in LEFT leg-- Peroneal and popliteal vein  Past Therapy: FOLFOX - s/p cycle5  Current Therapy: FOLFIRI/Bevacizumab -started 01/20/2019,s/p cycle #16 -- Avastin d/c'ed on 10/21/2019 due to DVT IV Iron as indicated Eliquis 5 mg po BID -- started on 09/30/2019 --DC on 04/01/2020 secondary to GI bleeding -- re-started on 05/03/2020 Xeloda with radiation therapy.  To start on 04/08/2020.  Patient will take 1500 mg of Xeloda daily.   Interim History:  Tonya [name] is here today with her daughter for follow-up.  She is getting through the radiation.  She is managing about as well as could be expected.  She is not having any diarrhea.  There is no obvious melena.  Her hemoglobin is dropping.  Have to believe that this is likely secondary to GI blood loss.  We did do a Doppler of her leg.  This is on January seventh.  There is a blood clot in the lower legs.  Again, this is no surprise.  We have to get her back on Eliquis.  We will have to take the risk that she is not going to bleed on the Eliquis.  She was told by her orthopedist that she needs Lasix for her legs.  I am not sure why he would not order this for her.  He told her that I would order this.  As such, we will order the Lasix.  I am not sure whether this is going to work or not.  I think she has a blood clot in her leg, and 1 way to help this and to help the swelling as to decrease the blood clot and allow for better blood flow out of the legs.  She has had no cough.  No shortness of breath.  I think she will need to be transfused.  I do think is can be very difficult for her to make good erythroid response.  She is on Xeloda.  I will give her 1 unit of blood.  She will need iron also.  Overall,  I would say performance status is ECOG 2.    Medications:  Allergies as of 05/12/2020      Reactions   Hydrocodone Swelling, Other (See Comments)   "I started swelling, became red, and passed out"   Iohexol Hives, Shortness Of Breath, Other (See Comments)   Patient developed hives and fullness in throat post injection of 125cc's Omni 300, Onset Date: 11/15/2006   Naproxen Other (See Comments)   "It made me feel out of my head"   Ibuprofen Nausea Only   Propoxyphene N-acetaminophen Other (See Comments)   "I couldn't find the door to make my way out of the room- I was in misery"      Medication List       Accurate as of May 12, 2020  2:29 PM. If you have any questions, ask your nurse or doctor.        STOP taking these medications   aspirin 81 MG EC tablet Stopped by: [name], MD   olmesartan 20 MG tablet Commonly known as: BENICAR Stopped by: [name], MD     TAKE these medications   amLODipine 10 MG tablet Commonly known as: NORVASC 10 mg daily.   apixaban 5 MG Tabs tablet  Commonly known as: ELIQUIS Take 1 tablet (5 mg total) by mouth 2 (two) times daily.   atorvastatin 10 MG tablet Commonly known as: LIPITOR Take 1 tablet (10 mg total) by mouth daily.   B-D SINGLE USE SWABS REGULAR Pads 1 each by Does not apply route as needed (to cleanse site prior to obtaining droplet of blood for CBG's). E11.9   B-D ULTRAFINE III SHORT PEN 31G X 8 MM Misc Generic drug: Insulin Pen Needle   capecitabine 500 MG tablet Commonly known as: XELODA Take 3 tablets (1,500 mg total) by mouth daily. Start taking on 04/06/2020.  Take Monday thru Friday for 6 weeks   dexamethasone 4 MG tablet Commonly known as: DECADRON Take 2 tablets (8 mg total) by mouth 2 (two) times daily. For four days following chemotherapy.   famotidine 40 MG tablet Commonly known as: Pepcid Take 1 tablet (40 mg total) by mouth 2 (two) times daily.   ketoconazole 2 % cream Commonly known  as: NIZORAL Apply 1 application topically 2 (two) times daily.   lidocaine-prilocaine cream Commonly known as: EMLA Apply 1 application topically as needed for up to 30 doses.   loperamide 2 MG tablet Commonly known as: Imodium A-D Take 2 tablets (4 mg total) by mouth 4 (four) times daily as needed. Take 2 at diarrhea onset , then 1 every 2hr until 12hrs with no BM. May take 2 every 4hrs at night. If diarrhea recurs repeat.   magic mouthwash Soln Swish and swallow equal parts Benadryl, Lidocaine and Nystatin.  5- 10 ml.'s four times a day as needed.   meclizine 12.5 MG tablet Commonly known as: ANTIVERT TAKE 1 TABLET(12.5 MG) BY MOUTH THREE TIMES DAILY AS NEEDED FOR DIZZINESS   metoprolol succinate 25 MG 24 hr tablet Commonly known as: TOPROL-XL 25 mg daily.   metoprolol tartrate 25 MG tablet Commonly known as: LOPRESSOR Take 1 tablet (25 mg total) by mouth 2 (two) times daily. What changed:   how to take this  when to take this   metroNIDAZOLE 500 MG tablet Commonly known as: FLAGYL Take 500 mg by mouth as needed.   neomycin 500 MG tablet Commonly known as: MYCIFRADIN Take 1,000 mg by mouth 3 (three) times daily.   prochlorperazine 10 MG tablet Commonly known as: COMPAZINE Take 1 tablet (10 mg total) by mouth every 6 (six) hours as needed (Nausea or vomiting). Started by: San Morelle, RN   terbinafine 1 % cream Commonly known as: LAMISIL Apply topically 2 (two) times daily. PRN   traMADol 50 MG tablet Commonly known as: ULTRAM TAKE 1 TABLET(50 MG) BY MOUTH EVERY 6 HOURS AS NEEDED   Tresiba FlexTouch 100 UNIT/ML FlexTouch Pen Generic drug: insulin degludec Inject 0.21 mLs (21 Units total) into the skin daily. Take 34 units on chemo days   True Metrix Blood Glucose Test test strip Generic drug: glucose blood TEST BLOOD SUGAR TWICE DAILY   TRUEplus Lancets 30G Misc 1 each by Does not apply route 2 (two) times daily. E11.9   vitamin B-12 500 MCG  tablet Commonly known as: CYANOCOBALAMIN Take 500 mcg by mouth daily.       Allergies:  Allergies  Allergen Reactions  . Hydrocodone Swelling and Other (See Comments)    "I started swelling, became red, and passed out"  . Iohexol Hives, Shortness Of Breath and Other (See Comments)    Patient developed hives and fullness in throat post injection of 125cc's Omni 300, Onset Date: 11/15/2006   .  Naproxen Other (See Comments)    "It made me feel out of my head"  . Ibuprofen Nausea Only  . Propoxyphene N-Acetaminophen Other (See Comments)    "I couldn't find the door to make my way out of the room- I was in misery"    Past Medical History, Surgical history, Social history, and Family History were reviewed and updated.  Review of Systems: Review of Systems  Constitutional: Negative.   HENT: Negative.   Eyes: Negative.   Respiratory: Negative.   Cardiovascular: Negative.   Gastrointestinal: Negative.   Genitourinary: Negative.   Musculoskeletal: Negative.   Skin: Negative.   Neurological: Negative.   Endo/Heme/Allergies: Negative.   Psychiatric/Behavioral: Negative.      Physical Exam:  oral temperature is 98.5 F (36.9 C). Her blood pressure is 135/60 and her pulse is 76. Her respiration is 20 and oxygen saturation is 100%.   Wt Readings from Last 3 Encounters:  02/16/20 182 lb (82.6 kg)  01/29/20 187 lb 12.8 oz (85.2 kg)  12/15/19 184 lb (83.5 kg)    Physical Exam Vitals reviewed.  HENT:     Head: Normocephalic and atraumatic.  Eyes:     Pupils: Pupils are equal, round, and reactive to light.  Cardiovascular:     Rate and Rhythm: Normal rate and regular rhythm.     Heart sounds: Normal heart sounds.  Pulmonary:     Effort: Pulmonary effort is normal.     Breath sounds: Normal breath sounds.  Abdominal:     General: Bowel sounds are normal.     Palpations: Abdomen is soft.  Musculoskeletal:        General: No tenderness or deformity. Normal range of motion.      Cervical back: Normal range of motion.     Comments: She has a brace on the right lower leg.  Lymphadenopathy:     Cervical: No cervical adenopathy.  Skin:    General: Skin is warm and dry.     Findings: No erythema or rash.  Neurological:     Mental Status: She is alert and oriented to person, place, and time.     Comments: She has some chronic weakness over on the left side from past stroke.  Psychiatric:        Behavior: Behavior normal.        Thought Content: Thought content normal.        Judgment: Judgment normal.      Lab Results  Component Value Date   WBC 5.9 05/12/2020   HGB 8.3 (L) 05/12/2020   HCT 27.0 (L) 05/12/2020   MCV 87.4 05/12/2020   PLT 226 05/12/2020   Lab Results  Component Value Date   FERRITIN 579 (H) 04/20/2020   IRON 24 (L) 04/20/2020   TIBC 218 (L) 04/20/2020   UIBC 193 04/20/2020   IRONPCTSAT 11 (L) 04/20/2020   Lab Results  Component Value Date   RETICCTPCT 2.2 02/26/2020   RBC 3.09 (L) 05/12/2020   RETICCTABS 62,400 05/17/2016   No results found for: KPAFRELGTCHN, LAMBDASER, KAPLAMBRATIO No results found for: IGGSERUM, IGA, IGMSERUM No results found for: Ronnald Ramp, A1GS, A2GS, Violet Baldy, MSPIKE, SPEI   Chemistry      Component Value Date/Time   NA 140 05/12/2020 1340   NA 139 09/30/2018 1311   K 3.6 05/12/2020 1340   CL 109 05/12/2020 1340   CO2 26 05/12/2020 1340   BUN 12 05/12/2020 1340   BUN 10 09/30/2018 1311  CREATININE 0.69 05/12/2020 1340      Component Value Date/Time   CALCIUM 9.1 05/12/2020 1340   ALKPHOS 77 05/12/2020 1340   AST 22 05/12/2020 1340   ALT 9 05/12/2020 1340   BILITOT 0.5 05/12/2020 1340       Impression and Plan: Tonya [name] is a very pleasant 80 yo African American female with metastatic colon cancer.  Again, she does have progressive disease.  She currently is on radiation along with Xeloda to try to help the primary site where she is likely bleeding from.     There is a lot going on.  She clearly needs the transfusion.  I will set this up for tomorrow.  Hopefully the Zaroxolyn/Lasix combination will help with the leg edema.  Again, I am not sure if this will help.  I would plan to see her back in another couple weeks or so.  She needs to have close follow-up with everything going on.   [name], MD 1/19/20222:29 PM

## 2020-05-12 NOTE — Telephone Encounter (Signed)
Called pt and left a vm with blood/iron appt per 1-19 inbasket message   Tonya [name]

## 2020-05-12 NOTE — Patient Instructions (Signed)

## 2020-05-12 NOTE — Telephone Encounter (Signed)
1-19 los appts made and pt will get a new sch at 1-20 appt    Tonya [name]

## 2020-05-13 ENCOUNTER — Other Ambulatory Visit: Payer: Medicare HMO

## 2020-05-13 ENCOUNTER — Inpatient Hospital Stay: Payer: Medicare HMO

## 2020-05-13 ENCOUNTER — Ambulatory Visit: Payer: Medicare HMO | Admitting: Hematology & Oncology

## 2020-05-13 ENCOUNTER — Ambulatory Visit
Admission: RE | Admit: 2020-05-13 | Discharge: 2020-05-13 | Disposition: A | Discharge status: Home / Self Care | Payer: Medicare HMO | Source: Ambulatory Visit | Attending: Radiation Oncology | Admitting: Radiation Oncology

## 2020-05-13 VITALS — BP 125/61 | HR 76 | Temp 98.3°F | Resp 20

## 2020-05-13 DIAGNOSIS — C189 Malignant neoplasm of colon, unspecified: Secondary | ICD-10-CM

## 2020-05-13 DIAGNOSIS — D500 Iron deficiency anemia secondary to blood loss (chronic): Secondary | ICD-10-CM | POA: Diagnosis not present

## 2020-05-13 DIAGNOSIS — Z79899 Other long term (current) drug therapy: Secondary | ICD-10-CM | POA: Diagnosis not present

## 2020-05-13 DIAGNOSIS — Z510 Encounter for antineoplastic radiation therapy: Secondary | ICD-10-CM | POA: Diagnosis not present

## 2020-05-13 DIAGNOSIS — R6889 Other general symptoms and signs: Secondary | ICD-10-CM | POA: Diagnosis not present

## 2020-05-13 DIAGNOSIS — Z86711 Personal history of pulmonary embolism: Secondary | ICD-10-CM | POA: Diagnosis not present

## 2020-05-13 DIAGNOSIS — C182 Malignant neoplasm of ascending colon: Secondary | ICD-10-CM | POA: Diagnosis not present

## 2020-05-13 DIAGNOSIS — Z7901 Long term (current) use of anticoagulants: Secondary | ICD-10-CM | POA: Diagnosis not present

## 2020-05-13 LAB — IRON AND TIBC
Iron: 25 ug/dL — ABNORMAL LOW (ref 41–142)
Saturation Ratios: 12 % — ABNORMAL LOW (ref 21–57)
TIBC: 206 ug/dL — ABNORMAL LOW (ref 236–444)
UIBC: 181 ug/dL (ref 120–384)

## 2020-05-13 LAB — FERRITIN: Ferritin: 773 ng/mL — ABNORMAL HIGH (ref 11–307)

## 2020-05-13 LAB — CEA (IN HOUSE-CHCC): CEA (CHCC-In House): 23.58 ng/mL — ABNORMAL HIGH (ref 0.00–5.00)

## 2020-05-13 MED ORDER — DIPHENHYDRAMINE HCL 25 MG PO CAPS
ORAL_CAPSULE | ORAL | Status: AC
  Filled 2020-05-13: qty 1

## 2020-05-13 MED ORDER — ACETAMINOPHEN 325 MG PO TABS
650.0000 mg | ORAL_TABLET | Freq: Once | ORAL | Status: AC
  Administered 2020-05-13: 650 mg via ORAL

## 2020-05-13 MED ORDER — HEPARIN SOD (PORK) LOCK FLUSH 100 UNIT/ML IV SOLN
500.0000 [IU] | Freq: Once | INTRAVENOUS | Status: AC | PRN
Start: 2020-05-13 — End: 2020-05-13
  Administered 2020-05-13: 500 [IU]
  Filled 2020-05-13: qty 5

## 2020-05-13 MED ORDER — FUROSEMIDE 10 MG/ML IJ SOLN: 20.0000 mg | Freq: Once | INTRAMUSCULAR | Status: DC

## 2020-05-13 MED ORDER — SODIUM CHLORIDE 0.9% IV SOLUTION
250.0000 mL | Freq: Once | INTRAVENOUS | Status: AC
  Administered 2020-05-13: 250 mL via INTRAVENOUS
  Filled 2020-05-13: qty 250

## 2020-05-13 MED ORDER — SODIUM CHLORIDE 0.9% FLUSH
10.0000 mL | INTRAVENOUS | Status: DC | PRN
  Administered 2020-05-13: 10 mL
  Filled 2020-05-13: qty 10

## 2020-05-13 MED ORDER — ACETAMINOPHEN 325 MG PO TABS
ORAL_TABLET | ORAL | Status: AC
  Filled 2020-05-13: qty 2

## 2020-05-13 MED ORDER — SODIUM CHLORIDE 0.9 % IV SOLN
200.0000 mg | Freq: Once | INTRAVENOUS | Status: AC
  Administered 2020-05-13: 200 mg via INTRAVENOUS
  Filled 2020-05-13: qty 10

## 2020-05-13 MED ORDER — DIPHENHYDRAMINE HCL 25 MG PO CAPS
25.0000 mg | ORAL_CAPSULE | Freq: Once | ORAL | Status: AC
  Administered 2020-05-13: 25 mg via ORAL

## 2020-05-14 ENCOUNTER — Ambulatory Visit
Admission: RE | Admit: 2020-05-14 | Discharge: 2020-05-14 | Disposition: A | Discharge status: Home / Self Care | Payer: Medicare HMO | Source: Ambulatory Visit | Attending: Radiation Oncology | Admitting: Radiation Oncology

## 2020-05-14 ENCOUNTER — Ambulatory Visit: Payer: Medicare HMO | Admitting: Internal Medicine

## 2020-05-14 DIAGNOSIS — Z510 Encounter for antineoplastic radiation therapy: Secondary | ICD-10-CM | POA: Diagnosis not present

## 2020-05-14 DIAGNOSIS — C182 Malignant neoplasm of ascending colon: Secondary | ICD-10-CM | POA: Diagnosis not present

## 2020-05-14 LAB — TYPE AND SCREEN
ABO/RH(D): A POS
Antibody Screen: NEGATIVE
Unit division: 0

## 2020-05-14 LAB — BPAM RBC
Blood Product Expiration Date: 202202162359
ISSUE DATE / TIME: 202201200758
Unit Type and Rh: 6200

## 2020-05-17 ENCOUNTER — Ambulatory Visit: Payer: Medicare HMO

## 2020-05-17 ENCOUNTER — Other Ambulatory Visit: Payer: Self-pay | Admitting: Hematology & Oncology

## 2020-05-17 ENCOUNTER — Ambulatory Visit
Admission: RE | Admit: 2020-05-17 | Discharge: 2020-05-17 | Disposition: A | Discharge status: Home / Self Care | Payer: Medicare HMO | Source: Ambulatory Visit | Attending: Radiation Oncology | Admitting: Radiation Oncology

## 2020-05-17 DIAGNOSIS — Z510 Encounter for antineoplastic radiation therapy: Secondary | ICD-10-CM | POA: Diagnosis not present

## 2020-05-17 DIAGNOSIS — C182 Malignant neoplasm of ascending colon: Secondary | ICD-10-CM | POA: Diagnosis not present

## 2020-05-18 ENCOUNTER — Ambulatory Visit
Admission: RE | Admit: 2020-05-18 | Discharge: 2020-05-18 | Disposition: A | Discharge status: Home / Self Care | Payer: Medicare HMO | Source: Ambulatory Visit | Attending: Radiation Oncology | Admitting: Radiation Oncology

## 2020-05-18 DIAGNOSIS — Z510 Encounter for antineoplastic radiation therapy: Secondary | ICD-10-CM | POA: Diagnosis not present

## 2020-05-18 DIAGNOSIS — C182 Malignant neoplasm of ascending colon: Secondary | ICD-10-CM | POA: Diagnosis not present

## 2020-05-19 ENCOUNTER — Ambulatory Visit: Payer: Medicare HMO

## 2020-05-19 ENCOUNTER — Other Ambulatory Visit: Payer: Self-pay

## 2020-05-19 ENCOUNTER — Ambulatory Visit
Admission: RE | Admit: 2020-05-19 | Discharge: 2020-05-19 | Disposition: A | Discharge status: Home / Self Care | Payer: Medicare HMO | Source: Ambulatory Visit | Attending: Radiation Oncology | Admitting: Radiation Oncology

## 2020-05-19 DIAGNOSIS — C182 Malignant neoplasm of ascending colon: Secondary | ICD-10-CM | POA: Diagnosis not present

## 2020-05-19 DIAGNOSIS — Z510 Encounter for antineoplastic radiation therapy: Secondary | ICD-10-CM | POA: Diagnosis not present

## 2020-05-20 ENCOUNTER — Ambulatory Visit: Payer: Medicare HMO

## 2020-05-20 ENCOUNTER — Ambulatory Visit
Admission: RE | Admit: 2020-05-20 | Discharge: 2020-05-20 | Disposition: A | Discharge status: Home / Self Care | Payer: Medicare HMO | Source: Ambulatory Visit | Attending: Radiation Oncology | Admitting: Radiation Oncology

## 2020-05-20 DIAGNOSIS — C182 Malignant neoplasm of ascending colon: Secondary | ICD-10-CM | POA: Diagnosis not present

## 2020-05-20 DIAGNOSIS — Z510 Encounter for antineoplastic radiation therapy: Secondary | ICD-10-CM | POA: Diagnosis not present

## 2020-05-21 ENCOUNTER — Ambulatory Visit: Payer: Medicare HMO

## 2020-05-21 ENCOUNTER — Other Ambulatory Visit: Payer: Self-pay

## 2020-05-21 ENCOUNTER — Encounter: Payer: Self-pay | Admitting: Radiation Oncology

## 2020-05-21 ENCOUNTER — Ambulatory Visit
Admission: RE | Admit: 2020-05-21 | Discharge: 2020-05-21 | Disposition: A | Discharge status: Home / Self Care | Payer: Medicare HMO | Source: Ambulatory Visit | Attending: Radiation Oncology | Admitting: Radiation Oncology

## 2020-05-21 DIAGNOSIS — Z9181 History of falling: Secondary | ICD-10-CM | POA: Diagnosis not present

## 2020-05-21 DIAGNOSIS — I4891 Unspecified atrial fibrillation: Secondary | ICD-10-CM | POA: Diagnosis not present

## 2020-05-21 DIAGNOSIS — Z86718 Personal history of other venous thrombosis and embolism: Secondary | ICD-10-CM | POA: Diagnosis not present

## 2020-05-21 DIAGNOSIS — Z510 Encounter for antineoplastic radiation therapy: Secondary | ICD-10-CM | POA: Diagnosis not present

## 2020-05-21 DIAGNOSIS — Z794 Long term (current) use of insulin: Secondary | ICD-10-CM | POA: Diagnosis not present

## 2020-05-21 DIAGNOSIS — I10 Essential (primary) hypertension: Secondary | ICD-10-CM | POA: Diagnosis not present

## 2020-05-21 DIAGNOSIS — Z993 Dependence on wheelchair: Secondary | ICD-10-CM | POA: Diagnosis not present

## 2020-05-21 DIAGNOSIS — C182 Malignant neoplasm of ascending colon: Secondary | ICD-10-CM | POA: Diagnosis not present

## 2020-05-21 DIAGNOSIS — E119 Type 2 diabetes mellitus without complications: Secondary | ICD-10-CM | POA: Diagnosis not present

## 2020-05-21 DIAGNOSIS — I69354 Hemiplegia and hemiparesis following cerebral infarction affecting left non-dominant side: Secondary | ICD-10-CM | POA: Diagnosis not present

## 2020-05-21 DIAGNOSIS — S82831D Other fracture of upper and lower end of right fibula, subsequent encounter for closed fracture with routine healing: Secondary | ICD-10-CM | POA: Diagnosis not present

## 2020-05-25 DIAGNOSIS — I69354 Hemiplegia and hemiparesis following cerebral infarction affecting left non-dominant side: Secondary | ICD-10-CM | POA: Diagnosis not present

## 2020-05-25 DIAGNOSIS — I10 Essential (primary) hypertension: Secondary | ICD-10-CM | POA: Diagnosis not present

## 2020-05-25 DIAGNOSIS — Z7901 Long term (current) use of anticoagulants: Secondary | ICD-10-CM | POA: Diagnosis not present

## 2020-05-25 DIAGNOSIS — C189 Malignant neoplasm of colon, unspecified: Secondary | ICD-10-CM | POA: Diagnosis not present

## 2020-05-25 DIAGNOSIS — C229 Malignant neoplasm of liver, not specified as primary or secondary: Secondary | ICD-10-CM | POA: Diagnosis not present

## 2020-05-25 DIAGNOSIS — E119 Type 2 diabetes mellitus without complications: Secondary | ICD-10-CM | POA: Diagnosis not present

## 2020-05-25 DIAGNOSIS — S82831D Other fracture of upper and lower end of right fibula, subsequent encounter for closed fracture with routine healing: Secondary | ICD-10-CM | POA: Diagnosis not present

## 2020-05-25 DIAGNOSIS — I4891 Unspecified atrial fibrillation: Secondary | ICD-10-CM | POA: Diagnosis not present

## 2020-05-25 DIAGNOSIS — Z794 Long term (current) use of insulin: Secondary | ICD-10-CM | POA: Diagnosis not present

## 2020-05-26 ENCOUNTER — Other Ambulatory Visit: Payer: Self-pay | Admitting: *Deleted

## 2020-05-26 ENCOUNTER — Telehealth: Payer: Self-pay | Admitting: *Deleted

## 2020-05-26 DIAGNOSIS — D649 Anemia, unspecified: Secondary | ICD-10-CM

## 2020-05-26 DIAGNOSIS — D500 Iron deficiency anemia secondary to blood loss (chronic): Secondary | ICD-10-CM

## 2020-05-26 DIAGNOSIS — C189 Malignant neoplasm of colon, unspecified: Secondary | ICD-10-CM

## 2020-05-26 NOTE — Telephone Encounter (Signed)
Call received from patient's daughter [name] stating that pt is having increased weakness and would like to have pt.'s HGB and Iron level checked.  Dr. Marin Olp notified and order received for pt to come in for lab only.  Message sent to scheduling.

## 2020-05-27 ENCOUNTER — Inpatient Hospital Stay: Payer: Medicare HMO

## 2020-05-27 ENCOUNTER — Inpatient Hospital Stay: Payer: Medicare HMO | Attending: Hematology & Oncology

## 2020-05-27 ENCOUNTER — Other Ambulatory Visit: Payer: Self-pay

## 2020-05-27 VITALS — BP 142/73 | HR 82 | Temp 98.5°F | Resp 18

## 2020-05-27 DIAGNOSIS — Z7982 Long term (current) use of aspirin: Secondary | ICD-10-CM | POA: Diagnosis not present

## 2020-05-27 DIAGNOSIS — D500 Iron deficiency anemia secondary to blood loss (chronic): Secondary | ICD-10-CM

## 2020-05-27 DIAGNOSIS — C787 Secondary malignant neoplasm of liver and intrahepatic bile duct: Secondary | ICD-10-CM

## 2020-05-27 DIAGNOSIS — Z86718 Personal history of other venous thrombosis and embolism: Secondary | ICD-10-CM | POA: Insufficient documentation

## 2020-05-27 DIAGNOSIS — Z86711 Personal history of pulmonary embolism: Secondary | ICD-10-CM | POA: Diagnosis not present

## 2020-05-27 DIAGNOSIS — D649 Anemia, unspecified: Secondary | ICD-10-CM

## 2020-05-27 DIAGNOSIS — C182 Malignant neoplasm of ascending colon: Secondary | ICD-10-CM | POA: Insufficient documentation

## 2020-05-27 DIAGNOSIS — Z79899 Other long term (current) drug therapy: Secondary | ICD-10-CM | POA: Insufficient documentation

## 2020-05-27 DIAGNOSIS — C189 Malignant neoplasm of colon, unspecified: Secondary | ICD-10-CM

## 2020-05-27 LAB — CBC WITH DIFFERENTIAL (CANCER CENTER ONLY)
Abs Immature Granulocytes: 0.03 10*3/uL (ref 0.00–0.07)
Basophils Absolute: 0 10*3/uL (ref 0.0–0.1)
Basophils Relative: 0 %
Eosinophils Absolute: 0 10*3/uL (ref 0.0–0.5)
Eosinophils Relative: 1 %
HCT: 30.2 % — ABNORMAL LOW (ref 36.0–46.0)
Hemoglobin: 9.6 g/dL — ABNORMAL LOW (ref 12.0–15.0)
Immature Granulocytes: 0 %
Lymphocytes Relative: 5 %
Lymphs Abs: 0.4 10*3/uL — ABNORMAL LOW (ref 0.7–4.0)
MCH: 27.7 pg (ref 26.0–34.0)
MCHC: 31.8 g/dL (ref 30.0–36.0)
MCV: 87.3 fL (ref 80.0–100.0)
Monocytes Absolute: 0.8 10*3/uL (ref 0.1–1.0)
Monocytes Relative: 11 %
Neutro Abs: 6.7 10*3/uL (ref 1.7–7.7)
Neutrophils Relative %: 83 %
Platelet Count: 229 10*3/uL (ref 150–400)
RBC: 3.46 MIL/uL — ABNORMAL LOW (ref 3.87–5.11)
RDW: 17.6 % — ABNORMAL HIGH (ref 11.5–15.5)
WBC Count: 8 10*3/uL (ref 4.0–10.5)
nRBC: 0 % (ref 0.0–0.2)

## 2020-05-27 LAB — CMP (CANCER CENTER ONLY)
ALT: 13 U/L (ref 0–44)
AST: 37 U/L (ref 15–41)
Albumin: 3.4 g/dL — ABNORMAL LOW (ref 3.5–5.0)
Alkaline Phosphatase: 105 U/L (ref 38–126)
Anion gap: 6 (ref 5–15)
BUN: 15 mg/dL (ref 8–23)
CO2: 31 mmol/L (ref 22–32)
Calcium: 9.5 mg/dL (ref 8.9–10.3)
Chloride: 102 mmol/L (ref 98–111)
Creatinine: 0.68 mg/dL (ref 0.44–1.00)
GFR, Estimated: >60 mL/min (ref >60)
Glucose, Bld: 143 mg/dL — ABNORMAL HIGH (ref 70–99)
Potassium: 3 mmol/L — ABNORMAL LOW (ref 3.5–5.1)
Sodium: 139 mmol/L (ref 135–145)
Total Bilirubin: 0.7 mg/dL (ref 0.3–1.2)
Total Protein: 6.1 g/dL — ABNORMAL LOW (ref 6.5–8.1)

## 2020-05-27 LAB — SAMPLE TO BLOOD BANK

## 2020-05-27 MED ORDER — SODIUM CHLORIDE 0.9% FLUSH
10.0000 mL | INTRAVENOUS | Status: DC | PRN
  Administered 2020-05-27: 10 mL
  Filled 2020-05-27: qty 10

## 2020-05-27 MED ORDER — HEPARIN SOD (PORK) LOCK FLUSH 100 UNIT/ML IV SOLN
500.0000 [IU] | Freq: Once | INTRAVENOUS | Status: AC | PRN
  Administered 2020-05-27: 500 [IU]
  Filled 2020-05-27: qty 5

## 2020-05-27 NOTE — Patient Instructions (Signed)

## 2020-05-28 ENCOUNTER — Telehealth: Payer: Self-pay | Admitting: *Deleted

## 2020-05-28 LAB — IRON AND TIBC
Iron: 23 ug/dL — ABNORMAL LOW (ref 41–142)
Saturation Ratios: 12 % — ABNORMAL LOW (ref 21–57)
TIBC: 198 ug/dL — ABNORMAL LOW (ref 236–444)
UIBC: 175 ug/dL (ref 120–384)

## 2020-05-28 LAB — FERRITIN: Ferritin: 1481 ng/mL — ABNORMAL HIGH (ref 11–307)

## 2020-05-28 NOTE — Telephone Encounter (Signed)
Called s/w [name], advised pt iron I slow and she will need to come in for IV Iron transfusion. [name] verbalized understanding. Pt and daughter to expect a call from scheduling with this appt information. No concerns at this time.

## 2020-05-28 NOTE — Telephone Encounter (Signed)
-----   Message from [name], MD sent at 05/28/2020  9:15 AM EST ----- Please call her daughter and tell her that Ms. Ayars's iron is quite low.  We need her in for an iron infusion.  Please set this up.  Is there any way that we can use [employer]??  Laurey Arrow

## 2020-05-31 ENCOUNTER — Telehealth: Payer: Self-pay

## 2020-05-31 ENCOUNTER — Telehealth: Payer: Self-pay | Admitting: *Deleted

## 2020-05-31 NOTE — Telephone Encounter (Signed)
Pt sch for iv iron per sch/inbasket message for 06/01/20         Tonya [name]

## 2020-05-31 NOTE — Telephone Encounter (Signed)
Call received from [place], patients daughter inquiring about iron infusion date.  Per chart note, patient is to get a dose of iron.  Msg sent to scheduling dept to set this up

## 2020-06-01 ENCOUNTER — Other Ambulatory Visit: Payer: Self-pay

## 2020-06-01 ENCOUNTER — Inpatient Hospital Stay: Payer: Medicare HMO

## 2020-06-01 VITALS — BP 140/73 | HR 88 | Temp 98.5°F | Resp 17

## 2020-06-01 DIAGNOSIS — R6889 Other general symptoms and signs: Secondary | ICD-10-CM | POA: Diagnosis not present

## 2020-06-01 DIAGNOSIS — Z79899 Other long term (current) drug therapy: Secondary | ICD-10-CM | POA: Diagnosis not present

## 2020-06-01 DIAGNOSIS — D500 Iron deficiency anemia secondary to blood loss (chronic): Secondary | ICD-10-CM

## 2020-06-01 DIAGNOSIS — Z86711 Personal history of pulmonary embolism: Secondary | ICD-10-CM | POA: Diagnosis not present

## 2020-06-01 DIAGNOSIS — Z86718 Personal history of other venous thrombosis and embolism: Secondary | ICD-10-CM | POA: Diagnosis not present

## 2020-06-01 DIAGNOSIS — C182 Malignant neoplasm of ascending colon: Secondary | ICD-10-CM | POA: Diagnosis not present

## 2020-06-01 DIAGNOSIS — Z7982 Long term (current) use of aspirin: Secondary | ICD-10-CM | POA: Diagnosis not present

## 2020-06-01 MED ORDER — SODIUM CHLORIDE 0.9 % IV SOLN
INTRAVENOUS | Status: DC
  Filled 2020-06-01: qty 250

## 2020-06-01 MED ORDER — HEPARIN SOD (PORK) LOCK FLUSH 100 UNIT/ML IV SOLN
250.0000 [IU] | Freq: Once | INTRAVENOUS | Status: AC | PRN
  Administered 2020-06-01: 250 [IU]
  Filled 2020-06-01: qty 5

## 2020-06-01 MED ORDER — SODIUM CHLORIDE 0.9% FLUSH
3.0000 mL | Freq: Once | INTRAVENOUS | Status: AC | PRN
  Administered 2020-06-01: 10 mL
  Filled 2020-06-01: qty 10

## 2020-06-01 MED ORDER — SODIUM CHLORIDE 0.9 % IV SOLN
200.0000 mg | Freq: Once | INTRAVENOUS | Status: AC
  Administered 2020-06-01: 200 mg via INTRAVENOUS
  Filled 2020-06-01: qty 200

## 2020-06-01 NOTE — Patient Instructions (Signed)

## 2020-06-04 DIAGNOSIS — Z794 Long term (current) use of insulin: Secondary | ICD-10-CM | POA: Diagnosis not present

## 2020-06-04 DIAGNOSIS — C229 Malignant neoplasm of liver, not specified as primary or secondary: Secondary | ICD-10-CM | POA: Diagnosis not present

## 2020-06-04 DIAGNOSIS — I4891 Unspecified atrial fibrillation: Secondary | ICD-10-CM | POA: Diagnosis not present

## 2020-06-04 DIAGNOSIS — I69354 Hemiplegia and hemiparesis following cerebral infarction affecting left non-dominant side: Secondary | ICD-10-CM | POA: Diagnosis not present

## 2020-06-04 DIAGNOSIS — E119 Type 2 diabetes mellitus without complications: Secondary | ICD-10-CM | POA: Diagnosis not present

## 2020-06-04 DIAGNOSIS — C189 Malignant neoplasm of colon, unspecified: Secondary | ICD-10-CM | POA: Diagnosis not present

## 2020-06-04 DIAGNOSIS — Z7901 Long term (current) use of anticoagulants: Secondary | ICD-10-CM | POA: Diagnosis not present

## 2020-06-04 DIAGNOSIS — I10 Essential (primary) hypertension: Secondary | ICD-10-CM | POA: Diagnosis not present

## 2020-06-04 DIAGNOSIS — S82831D Other fracture of upper and lower end of right fibula, subsequent encounter for closed fracture with routine healing: Secondary | ICD-10-CM | POA: Diagnosis not present

## 2020-06-07 ENCOUNTER — Other Ambulatory Visit: Payer: Self-pay | Admitting: Endocrinology

## 2020-06-07 DIAGNOSIS — Z794 Long term (current) use of insulin: Secondary | ICD-10-CM

## 2020-06-07 DIAGNOSIS — S82491D Other fracture of shaft of right fibula, subsequent encounter for closed fracture with routine healing: Secondary | ICD-10-CM | POA: Diagnosis not present

## 2020-06-07 DIAGNOSIS — M25561 Pain in right knee: Secondary | ICD-10-CM | POA: Diagnosis not present

## 2020-06-07 DIAGNOSIS — E1149 Type 2 diabetes mellitus with other diabetic neurological complication: Secondary | ICD-10-CM

## 2020-06-08 DIAGNOSIS — I4891 Unspecified atrial fibrillation: Secondary | ICD-10-CM | POA: Diagnosis not present

## 2020-06-08 DIAGNOSIS — Z794 Long term (current) use of insulin: Secondary | ICD-10-CM | POA: Diagnosis not present

## 2020-06-08 DIAGNOSIS — I69354 Hemiplegia and hemiparesis following cerebral infarction affecting left non-dominant side: Secondary | ICD-10-CM | POA: Diagnosis not present

## 2020-06-08 DIAGNOSIS — I10 Essential (primary) hypertension: Secondary | ICD-10-CM | POA: Diagnosis not present

## 2020-06-08 DIAGNOSIS — C229 Malignant neoplasm of liver, not specified as primary or secondary: Secondary | ICD-10-CM | POA: Diagnosis not present

## 2020-06-08 DIAGNOSIS — S82831D Other fracture of upper and lower end of right fibula, subsequent encounter for closed fracture with routine healing: Secondary | ICD-10-CM | POA: Diagnosis not present

## 2020-06-08 DIAGNOSIS — E119 Type 2 diabetes mellitus without complications: Secondary | ICD-10-CM | POA: Diagnosis not present

## 2020-06-08 DIAGNOSIS — Z7901 Long term (current) use of anticoagulants: Secondary | ICD-10-CM | POA: Diagnosis not present

## 2020-06-08 DIAGNOSIS — C189 Malignant neoplasm of colon, unspecified: Secondary | ICD-10-CM | POA: Diagnosis not present

## 2020-06-09 ENCOUNTER — Telehealth: Payer: Self-pay | Admitting: Emergency Medicine

## 2020-06-10 DIAGNOSIS — I10 Essential (primary) hypertension: Secondary | ICD-10-CM | POA: Diagnosis not present

## 2020-06-10 DIAGNOSIS — Z794 Long term (current) use of insulin: Secondary | ICD-10-CM | POA: Diagnosis not present

## 2020-06-10 DIAGNOSIS — C229 Malignant neoplasm of liver, not specified as primary or secondary: Secondary | ICD-10-CM | POA: Diagnosis not present

## 2020-06-10 DIAGNOSIS — S82831D Other fracture of upper and lower end of right fibula, subsequent encounter for closed fracture with routine healing: Secondary | ICD-10-CM | POA: Diagnosis not present

## 2020-06-10 DIAGNOSIS — C189 Malignant neoplasm of colon, unspecified: Secondary | ICD-10-CM | POA: Diagnosis not present

## 2020-06-10 DIAGNOSIS — I69354 Hemiplegia and hemiparesis following cerebral infarction affecting left non-dominant side: Secondary | ICD-10-CM | POA: Diagnosis not present

## 2020-06-10 DIAGNOSIS — E119 Type 2 diabetes mellitus without complications: Secondary | ICD-10-CM | POA: Diagnosis not present

## 2020-06-10 DIAGNOSIS — I4891 Unspecified atrial fibrillation: Secondary | ICD-10-CM | POA: Diagnosis not present

## 2020-06-10 DIAGNOSIS — Z7901 Long term (current) use of anticoagulants: Secondary | ICD-10-CM | POA: Diagnosis not present

## 2020-06-15 DIAGNOSIS — C229 Malignant neoplasm of liver, not specified as primary or secondary: Secondary | ICD-10-CM | POA: Diagnosis not present

## 2020-06-15 DIAGNOSIS — E119 Type 2 diabetes mellitus without complications: Secondary | ICD-10-CM | POA: Diagnosis not present

## 2020-06-15 DIAGNOSIS — Z7901 Long term (current) use of anticoagulants: Secondary | ICD-10-CM | POA: Diagnosis not present

## 2020-06-15 DIAGNOSIS — S82831D Other fracture of upper and lower end of right fibula, subsequent encounter for closed fracture with routine healing: Secondary | ICD-10-CM | POA: Diagnosis not present

## 2020-06-15 DIAGNOSIS — Z794 Long term (current) use of insulin: Secondary | ICD-10-CM | POA: Diagnosis not present

## 2020-06-15 DIAGNOSIS — I69354 Hemiplegia and hemiparesis following cerebral infarction affecting left non-dominant side: Secondary | ICD-10-CM | POA: Diagnosis not present

## 2020-06-15 DIAGNOSIS — I4891 Unspecified atrial fibrillation: Secondary | ICD-10-CM | POA: Diagnosis not present

## 2020-06-15 DIAGNOSIS — I10 Essential (primary) hypertension: Secondary | ICD-10-CM | POA: Diagnosis not present

## 2020-06-15 DIAGNOSIS — C189 Malignant neoplasm of colon, unspecified: Secondary | ICD-10-CM | POA: Diagnosis not present

## 2020-06-17 ENCOUNTER — Encounter: Payer: Self-pay | Admitting: *Deleted

## 2020-06-17 DIAGNOSIS — C229 Malignant neoplasm of liver, not specified as primary or secondary: Secondary | ICD-10-CM | POA: Diagnosis not present

## 2020-06-17 DIAGNOSIS — E119 Type 2 diabetes mellitus without complications: Secondary | ICD-10-CM | POA: Diagnosis not present

## 2020-06-17 DIAGNOSIS — C189 Malignant neoplasm of colon, unspecified: Secondary | ICD-10-CM | POA: Diagnosis not present

## 2020-06-17 DIAGNOSIS — Z794 Long term (current) use of insulin: Secondary | ICD-10-CM | POA: Diagnosis not present

## 2020-06-17 DIAGNOSIS — S82831D Other fracture of upper and lower end of right fibula, subsequent encounter for closed fracture with routine healing: Secondary | ICD-10-CM | POA: Diagnosis not present

## 2020-06-17 DIAGNOSIS — I69354 Hemiplegia and hemiparesis following cerebral infarction affecting left non-dominant side: Secondary | ICD-10-CM | POA: Diagnosis not present

## 2020-06-17 DIAGNOSIS — I4891 Unspecified atrial fibrillation: Secondary | ICD-10-CM | POA: Diagnosis not present

## 2020-06-17 DIAGNOSIS — I10 Essential (primary) hypertension: Secondary | ICD-10-CM | POA: Diagnosis not present

## 2020-06-17 DIAGNOSIS — Z7901 Long term (current) use of anticoagulants: Secondary | ICD-10-CM | POA: Diagnosis not present

## 2020-06-21 ENCOUNTER — Other Ambulatory Visit: Payer: Self-pay | Admitting: *Deleted

## 2020-06-21 ENCOUNTER — Telehealth: Payer: Self-pay | Admitting: *Deleted

## 2020-06-21 ENCOUNTER — Ambulatory Visit
Admission: RE | Admit: 2020-06-21 | Discharge: 2020-06-21 | Disposition: A | Discharge status: Home / Self Care | Payer: Medicare HMO | Source: Ambulatory Visit | Attending: Radiation Oncology | Admitting: Radiation Oncology

## 2020-06-21 ENCOUNTER — Other Ambulatory Visit: Payer: Self-pay

## 2020-06-21 ENCOUNTER — Encounter: Payer: Self-pay | Admitting: Radiation Oncology

## 2020-06-21 VITALS — BP 102/77 | HR 82 | Temp 97.9°F | Resp 20 | Ht 71.0 in

## 2020-06-21 DIAGNOSIS — C182 Malignant neoplasm of ascending colon: Secondary | ICD-10-CM | POA: Diagnosis not present

## 2020-06-21 DIAGNOSIS — I82402 Acute embolism and thrombosis of unspecified deep veins of left lower extremity: Secondary | ICD-10-CM

## 2020-06-21 DIAGNOSIS — Z923 Personal history of irradiation: Secondary | ICD-10-CM | POA: Diagnosis not present

## 2020-06-21 DIAGNOSIS — R109 Unspecified abdominal pain: Secondary | ICD-10-CM | POA: Diagnosis not present

## 2020-06-21 DIAGNOSIS — Z79899 Other long term (current) drug therapy: Secondary | ICD-10-CM | POA: Insufficient documentation

## 2020-06-21 DIAGNOSIS — C189 Malignant neoplasm of colon, unspecified: Secondary | ICD-10-CM

## 2020-06-21 DIAGNOSIS — Z7901 Long term (current) use of anticoagulants: Secondary | ICD-10-CM | POA: Diagnosis not present

## 2020-06-21 MED ORDER — APIXABAN 5 MG PO TABS: 5.0000 mg | ORAL_TABLET | Freq: Two times a day (BID) | ORAL | 1 refills | Status: DC

## 2020-06-21 NOTE — Progress Notes (Signed)
Radiation Oncology         [phone] ________________________________  Name: Tonya [name] MRN: [id number]  Date: 06/21/2020  DOB: 1941-02-22  Follow-Up Visit Note  CC: Leeroy Cha, MD  [name], MD    ICD-10-CM   1. Colon cancer metastasized to liver [facility])  C18.9    C78.7     Diagnosis: Stage IV adenocarcinoma of the ascending colon, primary site now unresectable due to medical issues  Interval Since Last Radiation: One month  Radiation Treatment Dates: 04/08/2020 through 05/21/2020  Site: Colon; abdomen Technique: 3D Total Dose (Gy): 50.4/50.4 Dose per Fx (Gy): 1.8 Completed Fx: 28/28 Beam Energies: 6X, 15X  Narrative:  The patient returns today for routine follow-up. No significant interval history since the end of treatment.  On review of systems, she reports feeling some occasional crampy pain in the abdominal region.  She denies any nausea or emesis.. She denies blood in her stools she continues to have some fatigue since completion of her radiation therapy..                            ALLERGIES:  is allergic to hydrocodone, iohexol, naproxen, ibuprofen, and propoxyphene n-acetaminophen.  Meds: Current Outpatient Medications  Medication Sig Dispense Refill  . Alcohol Swabs (B-D SINGLE USE SWABS REGULAR) PADS 1 each by Does not apply route as needed (to cleanse site prior to obtaining droplet of blood for CBG's). E11.9 100 each 2  . amLODipine (NORVASC) 10 MG tablet 10 mg daily.    Marland Kitchen apixaban (ELIQUIS) 5 MG TABS tablet Take 1 tablet (5 mg total) by mouth 2 (two) times daily. 60 tablet 1  . atorvastatin (LIPITOR) 10 MG tablet Take 1 tablet (10 mg total) by mouth daily. 90 tablet 1  . B-D ULTRAFINE III SHORT PEN 31G X 8 MM MISC     . famotidine (PEPCID) 40 MG tablet Take 1 tablet (40 mg total) by mouth 2 (two) times daily. 180 tablet 3  . furosemide (LASIX) 40 MG tablet Take 1 tablet (40 mg total) by mouth daily. 30 tablet 4  . insulin degludec  (TRESIBA FLEXTOUCH) 100 UNIT/ML FlexTouch Pen Inject 0.21 mLs (21 Units total) into the skin daily. Take 34 units on chemo days 10 pen 11  . ketoconazole (NIZORAL) 2 % cream Apply 1 application topically 2 (two) times daily. 60 g 2  . lidocaine-prilocaine (EMLA) cream Apply 1 application topically as needed for up to 30 doses. 30 g 1  . meclizine (ANTIVERT) 12.5 MG tablet TAKE 1 TABLET(12.5 MG) BY MOUTH THREE TIMES DAILY AS NEEDED FOR DIZZINESS 30 tablet 2  . metolazone (ZAROXOLYN) 5 MG tablet Take 1 tablet (5 mg total) by mouth daily. Take 60 minutes BEFORE Lasix 30 tablet 4  . metoprolol succinate (TOPROL-XL) 25 MG 24 hr tablet 25 mg daily.    . potassium chloride SA (KLOR-CON) 20 MEQ tablet Take 1 tablet (20 mEq total) by mouth daily. 30 tablet 4  . prochlorperazine (COMPAZINE) 10 MG tablet Take 1 tablet (10 mg total) by mouth every 6 (six) hours as needed (Nausea or vomiting). 30 tablet 1  . terbinafine (LAMISIL) 1 % cream Apply topically 2 (two) times daily. PRN    . TRUE METRIX BLOOD GLUCOSE TEST test strip TEST BLOOD SUGAR TWICE DAILY 200 strip 1  . TRUEplus Lancets 30G MISC 1 each by Does not apply route 2 (two) times daily. E11.9 180 each 0  .  vitamin B-12 (CYANOCOBALAMIN) 500 MCG tablet Take 500 mcg by mouth daily.    . capecitabine (XELODA) 500 MG tablet Take 3 tablets (1,500 mg total) by mouth daily. Start taking on 04/06/2020.  Take Monday thru Friday for 6 weeks (Patient not taking: Reported on 06/21/2020) 90 tablet 0  . dexamethasone (DECADRON) 4 MG tablet Take 2 tablets (8 mg total) by mouth 2 (two) times daily. For four days following chemotherapy. (Patient not taking: No sig reported) 60 tablet 2  . magic mouthwash SOLN Swish and swallow equal parts Benadryl, Lidocaine and Nystatin.  5- 10 ml.'s four times a day as needed. (Patient not taking: No sig reported) 240 mL 0  . metoprolol tartrate (LOPRESSOR) 25 MG tablet Take 1 tablet (25 mg total) by mouth 2 (two) times daily. (Patient  not taking: No sig reported) 180 tablet 0  . metroNIDAZOLE (FLAGYL) 500 MG tablet Take 500 mg by mouth as needed. (Patient not taking: No sig reported)    . neomycin (MYCIFRADIN) 500 MG tablet Take 1,000 mg by mouth 3 (three) times daily. (Patient not taking: No sig reported)    . traMADol (ULTRAM) 50 MG tablet TAKE 1 TABLET(50 MG) BY MOUTH EVERY 6 HOURS AS NEEDED (Patient not taking: Reported on 06/21/2020) 30 tablet 3   No current facility-administered medications for this encounter.    Physical Findings: The patient is in no acute distress. Patient is alert and oriented.  height is 5\' 11"  (1.803 m). Her temperature is 97.9 F (36.6 C). Her blood pressure is 102/77 and her pulse is 82. Her respiration is 20 and oxygen saturation is 98%.   Lungs are clear to auscultation bilaterally. Heart has regular rate and rhythm. No palpable cervical, supraclavicular, or axillary adenopathy. Abdomen soft, non-tender, normal bowel sounds.   Lab Findings: Lab Results  Component Value Date   WBC 8.0 05/27/2020   HGB 9.6 (L) 05/27/2020   HCT 30.2 (L) 05/27/2020   MCV 87.3 05/27/2020   PLT 229 05/27/2020    Radiographic Findings: No results found.  Impression: Stage IV adenocarcinoma of the ascending colon, primary site now unresectable due to medical issues  The patient is recovering from the effects of radiation.  Clinically stable at this time  Plan: The patient is scheduled to follow up with Dr. Marin Olp on 06/23/2020. She will follow up with radiation oncology in as needed basis in light of her close follow-up with Dr. Marin Olp.  Anticipate that she will likely have another PET scan in approximately 2 months.   ____________________________________   Blair Promise, PhD, MD  This document serves as a record of services personally performed by Gery Pray, MD. It was created on his behalf by Clerance Lav, a trained medical scribe. The creation of this record is based on the scribe's personal  observations and the provider's statements to them. This document has been checked and approved by the attending provider.

## 2020-06-21 NOTE — Progress Notes (Incomplete)
°  Patient Name: Tonya [name] MRN: [id number] DOB: 10-Oct-1940 Referring Physician: Burney Gauze (Profile Not Attached) Date of Service: 05/21/2020 [facility], [place]                                                        End Of Treatment Note  Diagnoses: C18.2-Malignant neoplasm of ascending colon  Cancer Staging: Stage IV adenocarcinoma of the ascending colon, primary site now unresectable due to medical issues  Intent: Palliative  Radiation Treatment Dates: 04/08/2020 through 05/21/2020  Site: Colon; abdomen Technique: 3D Total Dose (Gy): 50.4/50.4 Dose per Fx (Gy): 1.8 Completed Fx: 28/28 Beam Energies: 6X, 15X  Narrative: The patient tolerated radiation therapy relatively well. She did report mild fatigue, poor appetite, nausea/vomiting that improved, intermittent constipation, dizziness, right-sided pain, and occasional diarrhea. She denied rectal bleeding, hematuria, and skin changes.  Plan: The patient will follow-up with radiation oncology in one month.  ________________________________________________   Blair Promise, PhD, MD  This document serves as a record of services personally performed by Gery Pray, MD. It was created on his behalf by Clerance Lav, a trained medical scribe. The creation of this record is based on the scribe's personal observations and the provider's statements to them. This document has been checked and approved by the attending provider.

## 2020-06-21 NOTE — Progress Notes (Signed)
Patient here today for a follow-up for colon cancer. Patient denies having any pain. Patient states moderate fatigue. Patient denies having diarrhea. Patient states having occasional nausea. Patient denies having dysuria. Patient states having frequency of urination. Patient denies having rectal bleeding. Patient states having some itching and burning at treatment site.   BP 102/77 (BP Location: Left Arm, Patient Position: Sitting, Cuff Size: Normal)   Pulse 82   Temp 97.9 F (36.6 C)   Resp 20   Ht 5\' 11"  (1.803 m)   SpO2 98%   BMI 25.38 kg/m

## 2020-06-21 NOTE — Telephone Encounter (Signed)
Call received from patient's daughter, Tonya [name] requesting a prescription for Megace d/t pt has a decreased appetite. Dr. Marin Olp notified.  [name] notified per order of Dr. Marin Olp that he does not want to order Megace for pt.  [name] states that Marinol causes pt to be dizzy and is not an option at this time.  She states that pt is drinking one can of glucerna daily.  Instructed [name] to have pt increase glucerna dose to twice daily. Tonya [name] is appreciative of assistance and has no other questions at this time.

## 2020-06-22 DIAGNOSIS — C229 Malignant neoplasm of liver, not specified as primary or secondary: Secondary | ICD-10-CM | POA: Diagnosis not present

## 2020-06-22 DIAGNOSIS — Z7901 Long term (current) use of anticoagulants: Secondary | ICD-10-CM | POA: Diagnosis not present

## 2020-06-22 DIAGNOSIS — I4891 Unspecified atrial fibrillation: Secondary | ICD-10-CM | POA: Diagnosis not present

## 2020-06-22 DIAGNOSIS — I10 Essential (primary) hypertension: Secondary | ICD-10-CM | POA: Diagnosis not present

## 2020-06-22 DIAGNOSIS — E119 Type 2 diabetes mellitus without complications: Secondary | ICD-10-CM | POA: Diagnosis not present

## 2020-06-22 DIAGNOSIS — S82831D Other fracture of upper and lower end of right fibula, subsequent encounter for closed fracture with routine healing: Secondary | ICD-10-CM | POA: Diagnosis not present

## 2020-06-22 DIAGNOSIS — I69354 Hemiplegia and hemiparesis following cerebral infarction affecting left non-dominant side: Secondary | ICD-10-CM | POA: Diagnosis not present

## 2020-06-22 DIAGNOSIS — C189 Malignant neoplasm of colon, unspecified: Secondary | ICD-10-CM | POA: Diagnosis not present

## 2020-06-22 DIAGNOSIS — Z794 Long term (current) use of insulin: Secondary | ICD-10-CM | POA: Diagnosis not present

## 2020-06-23 ENCOUNTER — Other Ambulatory Visit (HOSPITAL_COMMUNITY): Payer: Self-pay | Admitting: Hematology & Oncology

## 2020-06-23 ENCOUNTER — Inpatient Hospital Stay: Payer: Medicare HMO | Attending: Hematology & Oncology

## 2020-06-23 ENCOUNTER — Encounter: Payer: Self-pay | Admitting: Hematology & Oncology

## 2020-06-23 ENCOUNTER — Inpatient Hospital Stay: Payer: Medicare HMO

## 2020-06-23 ENCOUNTER — Telehealth: Payer: Self-pay

## 2020-06-23 ENCOUNTER — Inpatient Hospital Stay (HOSPITAL_BASED_OUTPATIENT_CLINIC_OR_DEPARTMENT_OTHER): Payer: Medicare HMO | Admitting: Hematology & Oncology

## 2020-06-23 ENCOUNTER — Other Ambulatory Visit: Payer: Self-pay

## 2020-06-23 VITALS — BP 118/69 | HR 94 | Temp 98.5°F | Resp 17 | Ht 71.0 in

## 2020-06-23 DIAGNOSIS — Z923 Personal history of irradiation: Secondary | ICD-10-CM | POA: Diagnosis not present

## 2020-06-23 DIAGNOSIS — Z9221 Personal history of antineoplastic chemotherapy: Secondary | ICD-10-CM | POA: Diagnosis not present

## 2020-06-23 DIAGNOSIS — Z86718 Personal history of other venous thrombosis and embolism: Secondary | ICD-10-CM | POA: Diagnosis not present

## 2020-06-23 DIAGNOSIS — Z79899 Other long term (current) drug therapy: Secondary | ICD-10-CM | POA: Diagnosis not present

## 2020-06-23 DIAGNOSIS — C189 Malignant neoplasm of colon, unspecified: Secondary | ICD-10-CM

## 2020-06-23 DIAGNOSIS — D500 Iron deficiency anemia secondary to blood loss (chronic): Secondary | ICD-10-CM | POA: Insufficient documentation

## 2020-06-23 DIAGNOSIS — C182 Malignant neoplasm of ascending colon: Secondary | ICD-10-CM | POA: Diagnosis not present

## 2020-06-23 DIAGNOSIS — C787 Secondary malignant neoplasm of liver and intrahepatic bile duct: Secondary | ICD-10-CM

## 2020-06-23 DIAGNOSIS — I4729 Other ventricular tachycardia: Secondary | ICD-10-CM

## 2020-06-23 DIAGNOSIS — I69359 Hemiplegia and hemiparesis following cerebral infarction affecting unspecified side: Secondary | ICD-10-CM

## 2020-06-23 DIAGNOSIS — I82402 Acute embolism and thrombosis of unspecified deep veins of left lower extremity: Secondary | ICD-10-CM

## 2020-06-23 DIAGNOSIS — I472 Ventricular tachycardia: Secondary | ICD-10-CM

## 2020-06-23 LAB — CBC WITH DIFFERENTIAL (CANCER CENTER ONLY)
Abs Immature Granulocytes: 0.05 10*3/uL (ref 0.00–0.07)
Basophils Absolute: 0 10*3/uL (ref 0.0–0.1)
Basophils Relative: 0 %
Eosinophils Absolute: 0 10*3/uL (ref 0.0–0.5)
Eosinophils Relative: 0 %
HCT: 29.5 % — ABNORMAL LOW (ref 36.0–46.0)
Hemoglobin: 9.2 g/dL — ABNORMAL LOW (ref 12.0–15.0)
Immature Granulocytes: 1 %
Lymphocytes Relative: 8 %
Lymphs Abs: 0.7 10*3/uL (ref 0.7–4.0)
MCH: 27.6 pg (ref 26.0–34.0)
MCHC: 31.2 g/dL (ref 30.0–36.0)
MCV: 88.6 fL (ref 80.0–100.0)
Monocytes Absolute: 0.7 10*3/uL (ref 0.1–1.0)
Monocytes Relative: 7 %
Neutro Abs: 8.1 10*3/uL — ABNORMAL HIGH (ref 1.7–7.7)
Neutrophils Relative %: 84 %
Platelet Count: 236 10*3/uL (ref 150–400)
RBC: 3.33 MIL/uL — ABNORMAL LOW (ref 3.87–5.11)
RDW: 16.8 % — ABNORMAL HIGH (ref 11.5–15.5)
WBC Count: 9.6 10*3/uL (ref 4.0–10.5)
nRBC: 0 % (ref 0.0–0.2)

## 2020-06-23 LAB — CMP (CANCER CENTER ONLY)
ALT: 25 U/L (ref 0–44)
AST: 74 U/L — ABNORMAL HIGH (ref 15–41)
Albumin: 3.5 g/dL (ref 3.5–5.0)
Alkaline Phosphatase: 233 U/L — ABNORMAL HIGH (ref 38–126)
Anion gap: 8 (ref 5–15)
BUN: 20 mg/dL (ref 8–23)
CO2: 31 mmol/L (ref 22–32)
Calcium: 9.7 mg/dL (ref 8.9–10.3)
Chloride: 102 mmol/L (ref 98–111)
Creatinine: 0.74 mg/dL (ref 0.44–1.00)
GFR, Estimated: >60 mL/min (ref >60)
Glucose, Bld: 137 mg/dL — ABNORMAL HIGH (ref 70–99)
Potassium: 3.6 mmol/L (ref 3.5–5.1)
Sodium: 141 mmol/L (ref 135–145)
Total Bilirubin: 0.8 mg/dL (ref 0.3–1.2)
Total Protein: 6.4 g/dL — ABNORMAL LOW (ref 6.5–8.1)

## 2020-06-23 LAB — RETICULOCYTES
Immature Retic Fract: 23.6 % — ABNORMAL HIGH (ref 2.3–15.9)
RBC.: 3.31 MIL/uL — ABNORMAL LOW (ref 3.87–5.11)
Retic Count, Absolute: 137 10*3/uL (ref 19.0–186.0)
Retic Ct Pct: 4.1 % — ABNORMAL HIGH (ref 0.4–3.1)

## 2020-06-23 LAB — IRON AND TIBC
Iron: 24 ug/dL — ABNORMAL LOW (ref 41–142)
Saturation Ratios: 12 % — ABNORMAL LOW (ref 21–57)
TIBC: 193 ug/dL — ABNORMAL LOW (ref 236–444)
UIBC: 169 ug/dL (ref 120–384)

## 2020-06-23 LAB — FERRITIN: Ferritin: 1841 ng/mL — ABNORMAL HIGH (ref 11–307)

## 2020-06-23 LAB — SAMPLE TO BLOOD BANK

## 2020-06-23 LAB — CEA (IN HOUSE-CHCC): CEA (CHCC-In House): 36.67 ng/mL — ABNORMAL HIGH (ref 0.00–5.00)

## 2020-06-23 MED ORDER — HEPARIN SOD (PORK) LOCK FLUSH 100 UNIT/ML IV SOLN
500.0000 [IU] | Freq: Once | INTRAVENOUS | Status: AC | PRN
  Administered 2020-06-23: 500 [IU]
  Filled 2020-06-23: qty 5

## 2020-06-23 MED ORDER — SODIUM CHLORIDE 0.9% FLUSH
10.0000 mL | INTRAVENOUS | Status: DC | PRN
Start: 2020-06-23 — End: 2020-06-23
  Administered 2020-06-23: 10 mL
  Filled 2020-06-23: qty 10

## 2020-06-23 MED ORDER — PREDNISONE 20 MG PO TABS: 20.0000 mg | ORAL_TABLET | Freq: Every day | ORAL | 5 refills | Status: DC

## 2020-06-23 MED FILL — predniSONE 20 MG TABS: 20 | 30 days supply | Qty: 30 | Fill #0

## 2020-06-23 NOTE — Progress Notes (Signed)
Hematology and Oncology Follow Up Visit  Tonya [name] [id number] [birth year] 80 y.o. 06/23/2020   Principle Diagnosis:  Stage IV adenocarcinoma of the ascending colon -- NRAS+, BRAF-, HER2-, MSI/MMR -,  Iron deficiency anemia DVT in LEFT leg-- Peroneal and popliteal vein  Past Therapy: FOLFOX - s/p cycle5  Current Therapy: FOLFIRI/Bevacizumab -started 01/20/2019,s/p cycle #16 -- Avastin d/c'ed on 10/21/2019 due to DVT IV Iron as indicated Eliquis 5 mg po BID -- started on 09/30/2019 --DC on 04/01/2020 secondary to GI bleeding -- re-started on 05/03/2020 Xeloda with radiation therapy.  To start on 04/08/2020.  Patient will take 1500 mg of Xeloda daily.   Interim History:  Tonya [name] is here today with her daughter for follow-up.  She just seems to be having a tough time.  She finished radiation therapy about 6 weeks ago.  She had Xeloda with this.  She seemed to get through it okay.  She is not eating well.  She does not like Marinol.  I will try her on prednisone (20 mg p.o. daily) as we cannot not give her Megace because of her history of thromboembolic disease.  Her last CEA was 24 back in January.  This is quite troublesome.  She is having some pain over on the right upper quadrant.  I suspect this probably is from her liver.  I do worry about the possibility of liver metastasis.  She has had no problems with bleeding.  There is been no issues with her urine.  She did get IV iron last time she was here.  Currently, I would have to say her performance status is by ECOG 3, at best.    Medications:  Allergies as of 06/23/2020      Reactions   Hydrocodone Swelling, Other (See Comments)   "I started swelling, became red, and passed out"   Iohexol Hives, Shortness Of Breath, Other (See Comments)   Patient developed hives and fullness in throat post injection of 125cc's Omni 300, Onset Date: 11/15/2006   Naproxen Other (See Comments)   "It made me feel out of my head"    Ibuprofen Nausea Only   Propoxyphene N-acetaminophen Other (See Comments)   "I couldn't find the door to make my way out of the room- I was in misery"      Medication List       Accurate as of June 23, 2020 11:02 AM. If you have any questions, ask your nurse or doctor.        amLODipine 10 MG tablet Commonly known as: NORVASC 10 mg daily.   apixaban 5 MG Tabs tablet Commonly known as: ELIQUIS Take 1 tablet (5 mg total) by mouth 2 (two) times daily.   atorvastatin 10 MG tablet Commonly known as: LIPITOR Take 1 tablet (10 mg total) by mouth daily.   B-D SINGLE USE SWABS REGULAR Pads 1 each by Does not apply route as needed (to cleanse site prior to obtaining droplet of blood for CBG's). E11.9   B-D ULTRAFINE III SHORT PEN 31G X 8 MM Misc Generic drug: Insulin Pen Needle   capecitabine 500 MG tablet Commonly known as: XELODA Take 3 tablets (1,500 mg total) by mouth daily. Start taking on 04/06/2020.  Take Monday thru Friday for 6 weeks   dexamethasone 4 MG tablet Commonly known as: DECADRON Take 2 tablets (8 mg total) by mouth 2 (two) times daily. For four days following chemotherapy.   famotidine 40 MG tablet Commonly known as: Pepcid Take 1 tablet (  40 mg total) by mouth 2 (two) times daily.   furosemide 40 MG tablet Commonly known as: LASIX Take 1 tablet (40 mg total) by mouth daily.   ketoconazole 2 % cream Commonly known as: NIZORAL Apply 1 application topically 2 (two) times daily.   lidocaine-prilocaine cream Commonly known as: EMLA Apply 1 application topically as needed for up to 30 doses.   magic mouthwash Soln Swish and swallow equal parts Benadryl, Lidocaine and Nystatin.  5- 10 ml.'s four times a day as needed.   meclizine 12.5 MG tablet Commonly known as: ANTIVERT TAKE 1 TABLET(12.5 MG) BY MOUTH THREE TIMES DAILY AS NEEDED FOR DIZZINESS   metolazone 5 MG tablet Commonly known as: ZAROXOLYN Take 1 tablet (5 mg total) by mouth daily. Take 60  minutes BEFORE Lasix   metoprolol succinate 25 MG 24 hr tablet Commonly known as: TOPROL-XL 25 mg daily.   metoprolol tartrate 25 MG tablet Commonly known as: LOPRESSOR Take 1 tablet (25 mg total) by mouth 2 (two) times daily.   metroNIDAZOLE 500 MG tablet Commonly known as: FLAGYL Take 500 mg by mouth as needed.   neomycin 500 MG tablet Commonly known as: MYCIFRADIN Take 1,000 mg by mouth 3 (three) times daily.   potassium chloride SA 20 MEQ tablet Commonly known as: KLOR-CON Take 1 tablet (20 mEq total) by mouth daily.   prochlorperazine 10 MG tablet Commonly known as: COMPAZINE Take 1 tablet (10 mg total) by mouth every 6 (six) hours as needed (Nausea or vomiting).   terbinafine 1 % cream Commonly known as: LAMISIL Apply topically 2 (two) times daily. PRN   traMADol 50 MG tablet Commonly known as: ULTRAM TAKE 1 TABLET(50 MG) BY MOUTH EVERY 6 HOURS AS NEEDED   Tresiba FlexTouch 100 UNIT/ML FlexTouch Pen Generic drug: insulin degludec Inject 0.21 mLs (21 Units total) into the skin daily. Take 34 units on chemo days   True Metrix Blood Glucose Test test strip Generic drug: glucose blood TEST BLOOD SUGAR TWICE DAILY   TRUEplus Lancets 30G Misc 1 each by Does not apply route 2 (two) times daily. E11.9   vitamin B-12 500 MCG tablet Commonly known as: CYANOCOBALAMIN Take 500 mcg by mouth daily.       Allergies:  Allergies  Allergen Reactions  . Hydrocodone Swelling and Other (See Comments)    "I started swelling, became red, and passed out"  . Iohexol Hives, Shortness Of Breath and Other (See Comments)    Patient developed hives and fullness in throat post injection of 125cc's Omni 300, Onset Date: 11/15/2006   . Naproxen Other (See Comments)    "It made me feel out of my head"  . Ibuprofen Nausea Only  . Propoxyphene N-Acetaminophen Other (See Comments)    "I couldn't find the door to make my way out of the room- I was in misery"    Past Medical  History, Surgical history, Social history, and Family History were reviewed and updated.  Review of Systems: Review of Systems  Constitutional: Negative.   HENT: Negative.   Eyes: Negative.   Respiratory: Negative.   Cardiovascular: Negative.   Gastrointestinal: Negative.   Genitourinary: Negative.   Musculoskeletal: Negative.   Skin: Negative.   Neurological: Negative.   Endo/Heme/Allergies: Negative.   Psychiatric/Behavioral: Negative.      Physical Exam:  height is _0  (1.803 m). Her oral temperature is 98.5 F (36.9 C). Her blood pressure is 118/69 and her pulse is 94. Her respiration is 17 and oxygen  saturation is 99%.   Wt Readings from Last 3 Encounters:  02/16/20 182 lb (82.6 kg)  01/29/20 187 lb 12.8 oz (85.2 kg)  12/15/19 184 lb (83.5 kg)    Physical Exam Vitals reviewed.  HENT:     Head: Normocephalic and atraumatic.  Eyes:     Pupils: Pupils are equal, round, and reactive to light.  Cardiovascular:     Rate and Rhythm: Normal rate and regular rhythm.     Heart sounds: Normal heart sounds.  Pulmonary:     Effort: Pulmonary effort is normal.     Breath sounds: Normal breath sounds.  Abdominal:     General: Bowel sounds are normal.     Palpations: Abdomen is soft.  Musculoskeletal:        General: No tenderness or deformity. Normal range of motion.     Cervical back: Normal range of motion.     Comments: She has a brace on the right lower leg.  Lymphadenopathy:     Cervical: No cervical adenopathy.  Skin:    General: Skin is warm and dry.     Findings: No erythema or rash.  Neurological:     Mental Status: She is alert and oriented to person, place, and time.     Comments: She has some chronic weakness over on the left side from past stroke.  Psychiatric:        Behavior: Behavior normal.        Thought Content: Thought content normal.        Judgment: Judgment normal.      Lab Results  Component Value Date   WBC 9.6 06/23/2020   HGB 9.2  (L) 06/23/2020   HCT 29.5 (L) 06/23/2020   MCV 88.6 06/23/2020   PLT 236 06/23/2020   Lab Results  Component Value Date   FERRITIN 1,481 (H) 05/27/2020   IRON 23 (L) 05/27/2020   TIBC 198 (L) 05/27/2020   UIBC 175 05/27/2020   IRONPCTSAT 12 (L) 05/27/2020   Lab Results  Component Value Date   RETICCTPCT 4.1 (H) 06/23/2020   RBC 3.33 (L) 06/23/2020   RBC 3.31 (L) 06/23/2020   RETICCTABS 62,400 05/17/2016   No results found for: KPAFRELGTCHN, LAMBDASER, KAPLAMBRATIO No results found for: IGGSERUM, IGA, IGMSERUM No results found for: Odetta Pink, SPEI   Chemistry      Component Value Date/Time   NA 141 06/23/2020 0954   NA 139 09/30/2018 1311   K 3.6 06/23/2020 0954   CL 102 06/23/2020 0954   CO2 31 06/23/2020 0954   BUN 20 06/23/2020 0954   BUN 10 09/30/2018 1311   CREATININE 0.74 06/23/2020 0954      Component Value Date/Time   CALCIUM 9.7 06/23/2020 0954   ALKPHOS 233 (H) 06/23/2020 0954   AST 74 (H) 06/23/2020 0954   ALT 25 06/23/2020 0954   BILITOT 0.8 06/23/2020 0954       Impression and Plan: Ms. Behrmann is a very pleasant 80 yo African American female with metastatic colon cancer.  Again, she does have progressive disease.  She underwent radiation and chemotherapy.  She had 28 days of radiation therapy along with daily Xeloda.  Forgot to mention that I told her to stop the amlodipine.  Her blood pressure is a little on the lower side.  I would like to get a CT scan on her in 2 weeks.  I will then plan to see her back in 4 weeks.  Again, I do worry about the possibility of her having progressive disease.  If she does, or treatment options are certainly not good to be all that wonderful.    [name], MD 3/2/202211:02 AM

## 2020-06-23 NOTE — Telephone Encounter (Signed)
appts made per 06/23/20 los, aware that they will get a call for scan and will p/u contrast today from downstairs       Caid Radin

## 2020-06-24 DIAGNOSIS — C189 Malignant neoplasm of colon, unspecified: Secondary | ICD-10-CM | POA: Diagnosis not present

## 2020-06-24 DIAGNOSIS — Z7901 Long term (current) use of anticoagulants: Secondary | ICD-10-CM | POA: Diagnosis not present

## 2020-06-24 DIAGNOSIS — I69354 Hemiplegia and hemiparesis following cerebral infarction affecting left non-dominant side: Secondary | ICD-10-CM | POA: Diagnosis not present

## 2020-06-24 DIAGNOSIS — C229 Malignant neoplasm of liver, not specified as primary or secondary: Secondary | ICD-10-CM | POA: Diagnosis not present

## 2020-06-24 DIAGNOSIS — I4891 Unspecified atrial fibrillation: Secondary | ICD-10-CM | POA: Diagnosis not present

## 2020-06-24 DIAGNOSIS — S82831D Other fracture of upper and lower end of right fibula, subsequent encounter for closed fracture with routine healing: Secondary | ICD-10-CM | POA: Diagnosis not present

## 2020-06-24 DIAGNOSIS — I10 Essential (primary) hypertension: Secondary | ICD-10-CM | POA: Diagnosis not present

## 2020-06-24 DIAGNOSIS — E119 Type 2 diabetes mellitus without complications: Secondary | ICD-10-CM | POA: Diagnosis not present

## 2020-06-24 DIAGNOSIS — Z794 Long term (current) use of insulin: Secondary | ICD-10-CM | POA: Diagnosis not present

## 2020-06-29 ENCOUNTER — Inpatient Hospital Stay: Payer: Medicare HMO

## 2020-06-29 ENCOUNTER — Other Ambulatory Visit: Payer: Self-pay

## 2020-06-29 VITALS — BP 125/78 | HR 89 | Temp 98.2°F | Resp 17

## 2020-06-29 DIAGNOSIS — Z86718 Personal history of other venous thrombosis and embolism: Secondary | ICD-10-CM | POA: Diagnosis not present

## 2020-06-29 DIAGNOSIS — Z79899 Other long term (current) drug therapy: Secondary | ICD-10-CM | POA: Diagnosis not present

## 2020-06-29 DIAGNOSIS — D500 Iron deficiency anemia secondary to blood loss (chronic): Secondary | ICD-10-CM | POA: Diagnosis not present

## 2020-06-29 DIAGNOSIS — C182 Malignant neoplasm of ascending colon: Secondary | ICD-10-CM | POA: Diagnosis not present

## 2020-06-29 DIAGNOSIS — Z9221 Personal history of antineoplastic chemotherapy: Secondary | ICD-10-CM | POA: Diagnosis not present

## 2020-06-29 DIAGNOSIS — Z923 Personal history of irradiation: Secondary | ICD-10-CM | POA: Diagnosis not present

## 2020-06-29 MED ORDER — SODIUM CHLORIDE 0.9 % IV SOLN
200.0000 mg | Freq: Once | INTRAVENOUS | Status: AC
  Administered 2020-06-29: 200 mg via INTRAVENOUS
  Filled 2020-06-29: qty 200

## 2020-06-29 MED ORDER — HEPARIN SOD (PORK) LOCK FLUSH 100 UNIT/ML IV SOLN
250.0000 [IU] | Freq: Once | INTRAVENOUS | Status: AC | PRN
  Administered 2020-06-29: 250 [IU]
  Filled 2020-06-29: qty 5

## 2020-06-29 MED ORDER — SODIUM CHLORIDE 0.9% FLUSH
3.0000 mL | Freq: Once | INTRAVENOUS | Status: DC | PRN
  Filled 2020-06-29: qty 10

## 2020-06-29 MED ORDER — SODIUM CHLORIDE 0.9% FLUSH
10.0000 mL | Freq: Once | INTRAVENOUS | Status: AC
  Administered 2020-06-29: 10 mL via INTRAVENOUS
  Filled 2020-06-29: qty 10

## 2020-06-29 MED ORDER — SODIUM CHLORIDE 0.9 % IV SOLN
INTRAVENOUS | Status: DC
  Filled 2020-06-29: qty 250

## 2020-06-29 NOTE — Patient Instructions (Signed)

## 2020-06-30 DIAGNOSIS — D6859 Other primary thrombophilia: Secondary | ICD-10-CM | POA: Diagnosis not present

## 2020-06-30 DIAGNOSIS — I10 Essential (primary) hypertension: Secondary | ICD-10-CM | POA: Diagnosis not present

## 2020-06-30 DIAGNOSIS — E785 Hyperlipidemia, unspecified: Secondary | ICD-10-CM | POA: Diagnosis not present

## 2020-06-30 DIAGNOSIS — I69354 Hemiplegia and hemiparesis following cerebral infarction affecting left non-dominant side: Secondary | ICD-10-CM | POA: Diagnosis not present

## 2020-06-30 DIAGNOSIS — C189 Malignant neoplasm of colon, unspecified: Secondary | ICD-10-CM | POA: Diagnosis not present

## 2020-06-30 DIAGNOSIS — Z794 Long term (current) use of insulin: Secondary | ICD-10-CM | POA: Diagnosis not present

## 2020-06-30 DIAGNOSIS — R6889 Other general symptoms and signs: Secondary | ICD-10-CM | POA: Diagnosis not present

## 2020-06-30 DIAGNOSIS — D500 Iron deficiency anemia secondary to blood loss (chronic): Secondary | ICD-10-CM | POA: Diagnosis not present

## 2020-06-30 DIAGNOSIS — E1169 Type 2 diabetes mellitus with other specified complication: Secondary | ICD-10-CM | POA: Diagnosis not present

## 2020-06-30 DIAGNOSIS — L89159 Pressure ulcer of sacral region, unspecified stage: Secondary | ICD-10-CM | POA: Diagnosis not present

## 2020-06-30 DIAGNOSIS — I82502 Chronic embolism and thrombosis of unspecified deep veins of left lower extremity: Secondary | ICD-10-CM | POA: Diagnosis not present

## 2020-06-30 DIAGNOSIS — E119 Type 2 diabetes mellitus without complications: Secondary | ICD-10-CM | POA: Diagnosis not present

## 2020-06-30 DIAGNOSIS — R5381 Other malaise: Secondary | ICD-10-CM | POA: Diagnosis not present

## 2020-07-01 DIAGNOSIS — E119 Type 2 diabetes mellitus without complications: Secondary | ICD-10-CM | POA: Diagnosis not present

## 2020-07-01 DIAGNOSIS — I69354 Hemiplegia and hemiparesis following cerebral infarction affecting left non-dominant side: Secondary | ICD-10-CM | POA: Diagnosis not present

## 2020-07-01 DIAGNOSIS — Z7901 Long term (current) use of anticoagulants: Secondary | ICD-10-CM | POA: Diagnosis not present

## 2020-07-01 DIAGNOSIS — C189 Malignant neoplasm of colon, unspecified: Secondary | ICD-10-CM | POA: Diagnosis not present

## 2020-07-01 DIAGNOSIS — S82831D Other fracture of upper and lower end of right fibula, subsequent encounter for closed fracture with routine healing: Secondary | ICD-10-CM | POA: Diagnosis not present

## 2020-07-01 DIAGNOSIS — I4891 Unspecified atrial fibrillation: Secondary | ICD-10-CM | POA: Diagnosis not present

## 2020-07-01 DIAGNOSIS — Z794 Long term (current) use of insulin: Secondary | ICD-10-CM | POA: Diagnosis not present

## 2020-07-01 DIAGNOSIS — C229 Malignant neoplasm of liver, not specified as primary or secondary: Secondary | ICD-10-CM | POA: Diagnosis not present

## 2020-07-01 DIAGNOSIS — I10 Essential (primary) hypertension: Secondary | ICD-10-CM | POA: Diagnosis not present

## 2020-07-05 ENCOUNTER — Telehealth: Payer: Self-pay | Admitting: *Deleted

## 2020-07-05 DIAGNOSIS — R6889 Other general symptoms and signs: Secondary | ICD-10-CM | POA: Diagnosis not present

## 2020-07-05 NOTE — Telephone Encounter (Signed)
Called patient for CT Chest Abd & Pelvic scan scheduled for 07/06/20 @ 1:00 p.m. - Notified daughter and confirmed. NPO prior to scans.

## 2020-07-06 ENCOUNTER — Ambulatory Visit (HOSPITAL_BASED_OUTPATIENT_CLINIC_OR_DEPARTMENT_OTHER): Payer: Medicare HMO

## 2020-07-07 DIAGNOSIS — Z7901 Long term (current) use of anticoagulants: Secondary | ICD-10-CM | POA: Diagnosis not present

## 2020-07-07 DIAGNOSIS — I10 Essential (primary) hypertension: Secondary | ICD-10-CM | POA: Diagnosis not present

## 2020-07-07 DIAGNOSIS — C189 Malignant neoplasm of colon, unspecified: Secondary | ICD-10-CM | POA: Diagnosis not present

## 2020-07-07 DIAGNOSIS — C229 Malignant neoplasm of liver, not specified as primary or secondary: Secondary | ICD-10-CM | POA: Diagnosis not present

## 2020-07-07 DIAGNOSIS — S82831D Other fracture of upper and lower end of right fibula, subsequent encounter for closed fracture with routine healing: Secondary | ICD-10-CM | POA: Diagnosis not present

## 2020-07-07 DIAGNOSIS — I4891 Unspecified atrial fibrillation: Secondary | ICD-10-CM | POA: Diagnosis not present

## 2020-07-07 DIAGNOSIS — Z794 Long term (current) use of insulin: Secondary | ICD-10-CM | POA: Diagnosis not present

## 2020-07-07 DIAGNOSIS — I69354 Hemiplegia and hemiparesis following cerebral infarction affecting left non-dominant side: Secondary | ICD-10-CM | POA: Diagnosis not present

## 2020-07-07 DIAGNOSIS — E119 Type 2 diabetes mellitus without complications: Secondary | ICD-10-CM | POA: Diagnosis not present

## 2020-07-08 ENCOUNTER — Other Ambulatory Visit: Payer: Self-pay

## 2020-07-08 ENCOUNTER — Ambulatory Visit (HOSPITAL_BASED_OUTPATIENT_CLINIC_OR_DEPARTMENT_OTHER)
Admission: RE | Admit: 2020-07-08 | Discharge: 2020-07-08 | Disposition: A | Discharge status: Home / Self Care | Payer: Medicare HMO | Source: Ambulatory Visit | Attending: Hematology & Oncology | Admitting: Hematology & Oncology

## 2020-07-08 DIAGNOSIS — N2882 Megaloureter: Secondary | ICD-10-CM | POA: Diagnosis not present

## 2020-07-08 DIAGNOSIS — R6889 Other general symptoms and signs: Secondary | ICD-10-CM | POA: Diagnosis not present

## 2020-07-08 DIAGNOSIS — I700 Atherosclerosis of aorta: Secondary | ICD-10-CM | POA: Diagnosis not present

## 2020-07-08 DIAGNOSIS — J9811 Atelectasis: Secondary | ICD-10-CM | POA: Diagnosis not present

## 2020-07-08 DIAGNOSIS — K6389 Other specified diseases of intestine: Secondary | ICD-10-CM | POA: Diagnosis not present

## 2020-07-08 DIAGNOSIS — C189 Malignant neoplasm of colon, unspecified: Secondary | ICD-10-CM | POA: Diagnosis not present

## 2020-07-08 DIAGNOSIS — J90 Pleural effusion, not elsewhere classified: Secondary | ICD-10-CM | POA: Diagnosis not present

## 2020-07-08 DIAGNOSIS — C787 Secondary malignant neoplasm of liver and intrahepatic bile duct: Secondary | ICD-10-CM | POA: Insufficient documentation

## 2020-07-09 DIAGNOSIS — I10 Essential (primary) hypertension: Secondary | ICD-10-CM | POA: Diagnosis not present

## 2020-07-09 DIAGNOSIS — Z7901 Long term (current) use of anticoagulants: Secondary | ICD-10-CM | POA: Diagnosis not present

## 2020-07-09 DIAGNOSIS — E119 Type 2 diabetes mellitus without complications: Secondary | ICD-10-CM | POA: Diagnosis not present

## 2020-07-09 DIAGNOSIS — S82831D Other fracture of upper and lower end of right fibula, subsequent encounter for closed fracture with routine healing: Secondary | ICD-10-CM | POA: Diagnosis not present

## 2020-07-09 DIAGNOSIS — I4891 Unspecified atrial fibrillation: Secondary | ICD-10-CM | POA: Diagnosis not present

## 2020-07-09 DIAGNOSIS — C229 Malignant neoplasm of liver, not specified as primary or secondary: Secondary | ICD-10-CM | POA: Diagnosis not present

## 2020-07-09 DIAGNOSIS — I69354 Hemiplegia and hemiparesis following cerebral infarction affecting left non-dominant side: Secondary | ICD-10-CM | POA: Diagnosis not present

## 2020-07-09 DIAGNOSIS — C189 Malignant neoplasm of colon, unspecified: Secondary | ICD-10-CM | POA: Diagnosis not present

## 2020-07-09 DIAGNOSIS — Z794 Long term (current) use of insulin: Secondary | ICD-10-CM | POA: Diagnosis not present

## 2020-07-12 ENCOUNTER — Encounter: Payer: Self-pay | Admitting: Cardiology

## 2020-07-12 ENCOUNTER — Telehealth: Payer: Self-pay | Admitting: Hematology & Oncology

## 2020-07-12 ENCOUNTER — Ambulatory Visit: Payer: Medicare HMO | Admitting: Cardiology

## 2020-07-12 ENCOUNTER — Other Ambulatory Visit: Payer: Self-pay

## 2020-07-12 VITALS — BP 133/75 | HR 72 | Temp 98.0°F | Resp 17 | Ht 71.0 in | Wt 161.0 lb

## 2020-07-12 DIAGNOSIS — I493 Ventricular premature depolarization: Secondary | ICD-10-CM

## 2020-07-12 DIAGNOSIS — I82532 Chronic embolism and thrombosis of left popliteal vein: Secondary | ICD-10-CM | POA: Diagnosis not present

## 2020-07-12 DIAGNOSIS — I10 Essential (primary) hypertension: Secondary | ICD-10-CM | POA: Diagnosis not present

## 2020-07-12 DIAGNOSIS — R6889 Other general symptoms and signs: Secondary | ICD-10-CM | POA: Diagnosis not present

## 2020-07-12 DIAGNOSIS — R600 Localized edema: Secondary | ICD-10-CM | POA: Diagnosis not present

## 2020-07-12 MED ORDER — METOLAZONE 5 MG PO TABS: 5.0000 mg | ORAL_TABLET | Freq: Every day | ORAL | 4 refills | Status: DC | PRN

## 2020-07-12 NOTE — Progress Notes (Signed)
Primary Physician/Referring:  Leeroy Cha, MD  Patient ID: Tonya [name], female    DOB: 1940-12-01, 80 y.o.   MRN: [id number]  Chief Complaint  Patient presents with  . Hypertension  . Frequent PVCs    HPI: Tonya [name]  is a 80 y.o. female with Hypertension, hyperlipidemia, uncontrolled diabetes mellitus, history of stroke involving the right pons in 2001 with moderate diffuse atherosclerotic changes in the intracerebral vessels when she presented with left-sided weakness and slurred speech, lumbosacral spondylosi, nonsmoker, h/o GI bleed on 10/18/2018 and anemia due to descending colonic cancer with liver metastasis and presently on chemotherapy and h/o DVT in July 2021 and now on Eliquis presents for follow-up of hypertension, dyspnea on exertion and abnormal EKG with frequent PVCs. Eliquis 5 mg po BID -- started on 09/30/2019 --DC on 04/01/2020 secondary to GI bleeding -- re-started on 05/03/2020 due to recurrence of bilateral extensive DVT.  She has completed RT Jan 2022. Finished chemotherapy. She is followed by Dr. Burney Gauze.  She made an appointment to see me due to elevated heart rate when she stands up and generalized weakness and leg edema.  No chest pain, her activity is markedly reduced and patient's daughter states that about 85 to 90% of the times patient is essentially in bed.  Mostly sitting in a wheelchair.  Past Medical History:  Diagnosis Date  . Cerebrovascular accident [facility]) 2001   left side weakness  . Chronic edema    ? venous insufficiency  . Colon cancer ([place]) 2020  . COPD (chronic obstructive pulmonary disease) ([place])    never had a problem breathing  . Diabetes mellitus without complication ([place])   . Dyspnea   . Enlarged heart    per pt  . Gallstones   . GERD (gastroesophageal reflux disease)   . Goals of care, counseling/discussion 11/01/2018  . History of radiation therapy 04/08/2020-05/21/2020   Colon;Dr. Gery Pray  . Hyperlipidemia    . Hypertension   . PONV (postoperative nausea and vomiting)    nausea     Past Surgical History:  Procedure Laterality Date  . ABDOMINAL HYSTERECTOMY    . BIOPSY  10/18/2018   Procedure: BIOPSY;  Surgeon: Jackquline Denmark, MD;  Location: [facility] ENDOSCOPY;  Service: Endoscopy;;  . CHOLECYSTECTOMY    . COLONOSCOPY WITH PROPOFOL N/A 10/18/2018   Procedure: COLONOSCOPY WITH PROPOFOL;  Surgeon: Jackquline Denmark, MD;  Location: [facility] ENDOSCOPY;  Service: Endoscopy;  Laterality: N/A;  . IR IMAGING GUIDED PORT INSERTION  11/11/2018  . IR IVC FILTER PLMT / S&I /IMG GUID/MOD SED  02/11/2020  . POLYPECTOMY  10/18/2018   Procedure: POLYPECTOMY;  Surgeon: Jackquline Denmark, MD;  Location: [facility] ENDOSCOPY;  Service: Endoscopy;;  . SUBMUCOSAL TATTOO INJECTION  10/18/2018   Procedure: SUBMUCOSAL TATTOO INJECTION;  Surgeon: Jackquline Denmark, MD;  Location: [facility] ENDOSCOPY;  Service: Endoscopy;;   Social History   Tobacco Use  . Smoking status: Former Smoker    Packs/day: 0.25    Types: Cigarettes    Quit date: 07/23/1968    Years since quitting: 52.0  . Smokeless tobacco: Never Used  . Tobacco comment: 6 MONTH  Substance Use Topics  . Alcohol use: No    Alcohol/week: 0.0 standard drinks   Marital Status: Widowed   Review of Systems  Cardiovascular: Positive for leg swelling and palpitations. Negative for chest pain and dyspnea on exertion.  Musculoskeletal: Positive for arthritis, back pain and joint pain.  Gastrointestinal: Negative for melena.  Neurological: Positive for focal  weakness (left hemiparesis).   Objective  Blood pressure 133/75, pulse 72, temperature 98 F (36.7 C), temperature source Temporal, resp. rate 17, height 5\' 11"  (1.803 m), weight 161 lb (73 kg), SpO2 96 %. Body mass index is 22.45 kg/m.  Vitals with BMI 07/12/2020 06/29/2020 06/23/2020  Height 5\' 11"  - 5\' 11"   Weight 161 lbs - (No Data)  BMI 78.58 - -  Systolic 850 277 412  Diastolic 75 78 69  Pulse 72 89 94    Physical  Exam Constitutional:      Comments: Moderately built  Neck:     Thyroid: No thyromegaly.     Vascular: No JVD.  Cardiovascular:     Rate and Rhythm: Normal rate and regular rhythm. Occasional extrasystoles are present.    Pulses: Intact distal pulses.     Heart sounds: Murmur heard.   Blowing midsystolic murmur is present with a grade of 3/6 at the apex. No gallop.      Comments: 2-3+ left leg edema, 2+ right leg edema, pitting.  No JVD. Pulmonary:     Effort: Pulmonary effort is normal.     Breath sounds: Normal breath sounds.  Abdominal:     General: Bowel sounds are normal.     Palpations: Abdomen is soft.  Neurological:     Comments: Grossly left arm weakness and mild contracture noted, left lower extremity weakness present. (left hemiparesis).    Radiology:   Laboratory examination:    CMP Latest Ref Rng & Units 06/23/2020 05/27/2020 05/12/2020  Glucose 70 - 99 mg/dL 137(H) 143(H) 98  BUN 8 - 23 mg/dL 20 15 12   Creatinine 0.44 - 1.00 mg/dL 0.74 0.68 0.69  Sodium 135 - 145 mmol/L 141 139 140  Potassium 3.5 - 5.1 mmol/L 3.6 3.0(L) 3.6  Chloride 98 - 111 mmol/L 102 102 109  CO2 22 - 32 mmol/L 31 31 26   Calcium 8.9 - 10.3 mg/dL 9.7 9.5 9.1  Total Protein 6.5 - 8.1 g/dL 6.4(L) 6.1(L) 5.7(L)  Total Bilirubin 0.3 - 1.2 mg/dL 0.8 0.7 0.5  Alkaline Phos 38 - 126 U/L 233(H) 105 77  AST 15 - 41 U/L 74(H) 37 22  ALT 0 - 44 U/L 25 13 9    CBC Latest Ref Rng & Units 06/23/2020 05/27/2020 05/12/2020  WBC 4.0 - 10.5 K/uL 9.6 8.0 5.9  Hemoglobin 12.0 - 15.0 g/dL 9.2(L) 9.6(L) 8.3(L)  Hematocrit 36.0 - 46.0 % 29.5(L) 30.2(L) 27.0(L)  Platelets 150 - 400 K/uL 236 229 226   Lipid Panel     Component Value Date/Time   CHOL 137 03/30/2018 1201   TRIG 80 03/30/2018 1201   HDL 49 03/30/2018 1201   CHOLHDL 2.8 03/30/2018 1201   CHOLHDL 3 07/17/2017 1227   VLDL 21.6 07/17/2017 1227   LDLCALC 72 03/30/2018 1201   HEMOGLOBIN A1C Lab Results  Component Value Date   HGBA1C 7.6 (A)  09/19/2019   TSH No results for input(s): TSH in the last 8760 hours.  Current Outpatient Medications on File Prior to Visit  Medication Sig Dispense Refill  . Alcohol Swabs (B-D SINGLE USE SWABS REGULAR) PADS 1 each by Does not apply route as needed (to cleanse site prior to obtaining droplet of blood for CBG's). E11.9 100 each 2  . apixaban (ELIQUIS) 5 MG TABS tablet Take 1 tablet (5 mg total) by mouth 2 (two) times daily. 60 tablet 1  . atorvastatin (LIPITOR) 10 MG tablet Take 1 tablet (10 mg total) by mouth daily. [place]  tablet 1  . B-D ULTRAFINE III SHORT PEN 31G X 8 MM MISC     . famotidine (PEPCID) 40 MG tablet Take 1 tablet (40 mg total) by mouth 2 (two) times daily. 180 tablet 3  . furosemide (LASIX) 40 MG tablet Take 1 tablet (40 mg total) by mouth daily. 30 tablet 4  . insulin degludec (TRESIBA FLEXTOUCH) 100 UNIT/ML FlexTouch Pen Inject 0.21 mLs (21 Units total) into the skin daily. Take 34 units on chemo days 10 pen 11  . ketoconazole (NIZORAL) 2 % cream Apply 1 application topically 2 (two) times daily. 60 g 2  . lidocaine-prilocaine (EMLA) cream Apply 1 application topically as needed for up to 30 doses. 30 g 1  . magic mouthwash SOLN Swish and swallow equal parts Benadryl, Lidocaine and Nystatin.  5- 10 ml.'s four times a day as needed. (Patient taking differently: Swish and swallow equal parts Benadryl, Lidocaine and Nystatin.  5- 10 ml.'s four times a day as needed.) 240 mL 0  . meclizine (ANTIVERT) 12.5 MG tablet TAKE 1 TABLET(12.5 MG) BY MOUTH THREE TIMES DAILY AS NEEDED FOR DIZZINESS 30 tablet 2  . metoprolol succinate (TOPROL-XL) 25 MG 24 hr tablet 25 mg daily.    . potassium chloride SA (KLOR-CON) 20 MEQ tablet Take 1 tablet (20 mEq total) by mouth daily. 30 tablet 4  . prochlorperazine (COMPAZINE) 10 MG tablet Take 1 tablet (10 mg total) by mouth every 6 (six) hours as needed (Nausea or vomiting). 30 tablet 1  . terbinafine (LAMISIL) 1 % cream Apply topically 2 (two) times  daily. PRN    . traMADol (ULTRAM) 50 MG tablet TAKE 1 TABLET(50 MG) BY MOUTH EVERY 6 HOURS AS NEEDED 30 tablet 3  . TRUE METRIX BLOOD GLUCOSE TEST test strip TEST BLOOD SUGAR TWICE DAILY 200 strip 1  . TRUEplus Lancets 30G MISC 1 each by Does not apply route 2 (two) times daily. E11.9 180 each 0  . vitamin B-12 (CYANOCOBALAMIN) 500 MCG tablet Take 500 mcg by mouth daily.     No current facility-administered medications on file prior to visit.    Cardiac Studies:   Holter Monitor for 24 hours  10/09/2016:  Normal sinus rhythm, sinus bradycardia and sinus tachycardia with heart rate ranging from 45-121bpm with average heart rate 69bpm.  Frequent PVCs, bigeminal PVCs, salvos and nonsustained ventricular tachycardia up to 5 beats in a ros  PVC load was 20%.  Occasional PACs and nonsustained atrial tachycardia up to 7 beats in a row.  Event monitor 30 days 07/30/2018: Predominant rhythm is normal sinus rhythm.  There were 3 runs of NSVT, maximum 13 beats, minimum 3 beats, asymptomatic.  Frequent PVCs, ventricular ectopic burden 5%.  There are occasional PACs.  Echocardiogram 08/28/2018: Left ventricle cavity is normal in size. Moderate concentric hypertrophy of the left ventricle. Normal global wall motion. Doppler evidence of grade II (pseudonormal) diastolic dysfunction, elevated LAP. Calculated EF 53%. Mild (Grade I) mitral regurgitation. Mild tricuspid regurgitation. Estimated pulmonary artery systolic pressure 26 mmHg. Mild pulmonic regurgitation. Compared to 10/09/16, previously mild LVH.   Lexiscan Myoview stress test 10/08/2018: Lexiscan stress test was performed. Stress EKG is non-diagnostic, as this is pharmacological stress test. Myocardial pefusion imaging is normal. LVEF 65%. Low risk study.  No significant change from  10/09/2016.  Lower Extremity Venous Duplex  10/01/2019: Nonocclusive deep venous thrombus identified within the LEFT popliteal and LEFT peroneal veins. No  evidence of deep venous thrombosis in the RIGHT lower extremity.  Lower Extremity Venous Duplex left leg  11/12/2019: Redemonstration of nonocclusive left popliteal vein DVT. No evidence of extension, and no evidence of proximal DVT.  Tibial veins appear patent with no DVT.  Lower Extremity Venous Duplex  04/30/2020: Sonographic survey of the bilateral lower extremities positive for new acute DVT of the bilateral common femoral, femoral, popliteal, and tibial vessels. Thrombus likely extends cephalad into at least the iliac veins.  EKG:   EKG 07/12/2020: Normal sinus rhythm at rate of 77 bpm, inferior infarct old.  Anteroseptal infarct old.  No evidence of ischemia.  Voltage criteria for LVH.    EKG 05/31/2018: Sinus rhythm with first-degree AV block at rate of 56 bpm, left axis deviation, left anterior fascicular block.  Cannot exclude inferior infarct old, anteroseptal infarct old.  No evidence of ischemia.     Assessment     ICD-10-CM   1. Essential hypertension  I10 EKG 12-Lead  2. Chronic deep vein thrombosis (DVT) of popliteal vein of left lower extremity (HCC)  I82.532   3. Bilateral leg edema  R60.0 metolazone (ZAROXOLYN) 5 MG tablet  4. Frequent PVCs  I49.3       Meds ordered this encounter  Medications  . metolazone (ZAROXOLYN) 5 MG tablet    Sig: Take 1 tablet (5 mg total) by mouth daily as needed (Excess fluid in leg). Take 60 minutes BEFORE Lasix    Dispense:  30 tablet    Refill:  4   Medications Discontinued During This Encounter  Medication Reason  . amLODipine (NORVASC) 10 MG tablet Error  . capecitabine (XELODA) 500 MG tablet Error  . dexamethasone (DECADRON) 4 MG tablet Error  . metroNIDAZOLE (FLAGYL) 500 MG tablet Error  . neomycin (MYCIFRADIN) 500 MG tablet Error  . predniSONE (DELTASONE) 20 MG tablet Error  . metoprolol tartrate (LOPRESSOR) 25 MG tablet Change in therapy  . metolazone (ZAROXOLYN) 5 MG tablet       Recommendations:   Tonya [name]  is a 80 y.o. female  with Hypertension, hyperlipidemia, uncontrolled diabetes mellitus, history of stroke involving the right pons in 2001 with moderate diffuse atherosclerotic changes in the intracerebral vessels when she presented with left-sided weakness and slurred speech, lumbosacral spondylosi, nonsmoker, h/o GI bleed on 10/18/2018 and anemia due to descending colonic cancer with liver metastasis and presently on chemotherapy and h/o DVT in July 2021 and now on Eliquis presents for follow-up of hypertension, dyspnea on exertion and abnormal EKG with frequent PVCs. Eliquis 5 mg po BID -- started on 09/30/2019 --DC on 04/01/2020 secondary to GI bleeding -- re-started on 05/03/2020 due to recurrence of bilateral extensive DVT.  She has completed RT Jan 2022. Finished chemotherapy. She is followed by Dr. Burney Gauze.  Patient's daughter is aware of poor prognosis, she is trying to keep it away from her mom.  I have indirectly spoken to Ms. Huelsmann about end-of-life issues and also goals of therapy to keep comfortable and to not to suffer were discussed in detail as well.  With regard to her leg edema, it is related to dependency and also extensive DVT.  There is no clinical evidence of heart failure.  In fact she is intravascularly volume depleted.  Patient is presently on very high dose of diuretics, advised her to change metolazone only to a as needed basis and continue Lasix for now.  Otherwise I did not make any changes to her medication.  I reviewed her chart and we all prayed for her wellbeing and  comfort.  I will see her back on a as needed basis.    Adrian Prows, MD, [facility] 07/12/2020, 12:57 PM Office: [phone]    YT:[id number] Marin Olp, MD

## 2020-07-12 NOTE — Telephone Encounter (Signed)
Returned call to patients daughter regarding her mothers appointments for 3/23. Per [facility] Nurse we will remove 2 hr infusion appointment from her schedule.  She will need to have labs drawn first to determine id iron infusion will be needed.  She verbalized complete understanding of this information at he end of the call and was very thankful for the return call

## 2020-07-13 DIAGNOSIS — E119 Type 2 diabetes mellitus without complications: Secondary | ICD-10-CM | POA: Diagnosis not present

## 2020-07-13 DIAGNOSIS — C189 Malignant neoplasm of colon, unspecified: Secondary | ICD-10-CM | POA: Diagnosis not present

## 2020-07-13 DIAGNOSIS — I10 Essential (primary) hypertension: Secondary | ICD-10-CM | POA: Diagnosis not present

## 2020-07-13 DIAGNOSIS — Z7901 Long term (current) use of anticoagulants: Secondary | ICD-10-CM | POA: Diagnosis not present

## 2020-07-13 DIAGNOSIS — C229 Malignant neoplasm of liver, not specified as primary or secondary: Secondary | ICD-10-CM | POA: Diagnosis not present

## 2020-07-13 DIAGNOSIS — I4891 Unspecified atrial fibrillation: Secondary | ICD-10-CM | POA: Diagnosis not present

## 2020-07-13 DIAGNOSIS — Z794 Long term (current) use of insulin: Secondary | ICD-10-CM | POA: Diagnosis not present

## 2020-07-13 DIAGNOSIS — S82831D Other fracture of upper and lower end of right fibula, subsequent encounter for closed fracture with routine healing: Secondary | ICD-10-CM | POA: Diagnosis not present

## 2020-07-13 DIAGNOSIS — I69354 Hemiplegia and hemiparesis following cerebral infarction affecting left non-dominant side: Secondary | ICD-10-CM | POA: Diagnosis not present

## 2020-07-14 ENCOUNTER — Encounter: Payer: Self-pay | Admitting: Hematology & Oncology

## 2020-07-14 ENCOUNTER — Telehealth: Payer: Self-pay | Admitting: Pharmacist

## 2020-07-14 ENCOUNTER — Other Ambulatory Visit: Payer: Self-pay

## 2020-07-14 ENCOUNTER — Inpatient Hospital Stay: Payer: Medicare HMO

## 2020-07-14 ENCOUNTER — Inpatient Hospital Stay (HOSPITAL_BASED_OUTPATIENT_CLINIC_OR_DEPARTMENT_OTHER): Payer: Medicare HMO | Admitting: Hematology & Oncology

## 2020-07-14 ENCOUNTER — Other Ambulatory Visit: Payer: Self-pay | Admitting: *Deleted

## 2020-07-14 VITALS — BP 133/73 | HR 80 | Temp 98.4°F | Resp 16

## 2020-07-14 DIAGNOSIS — C189 Malignant neoplasm of colon, unspecified: Secondary | ICD-10-CM

## 2020-07-14 DIAGNOSIS — C787 Secondary malignant neoplasm of liver and intrahepatic bile duct: Secondary | ICD-10-CM | POA: Diagnosis not present

## 2020-07-14 DIAGNOSIS — Z9221 Personal history of antineoplastic chemotherapy: Secondary | ICD-10-CM | POA: Diagnosis not present

## 2020-07-14 DIAGNOSIS — D500 Iron deficiency anemia secondary to blood loss (chronic): Secondary | ICD-10-CM

## 2020-07-14 DIAGNOSIS — Z923 Personal history of irradiation: Secondary | ICD-10-CM | POA: Diagnosis not present

## 2020-07-14 DIAGNOSIS — C182 Malignant neoplasm of ascending colon: Secondary | ICD-10-CM | POA: Diagnosis not present

## 2020-07-14 DIAGNOSIS — Z86718 Personal history of other venous thrombosis and embolism: Secondary | ICD-10-CM | POA: Diagnosis not present

## 2020-07-14 DIAGNOSIS — R6889 Other general symptoms and signs: Secondary | ICD-10-CM | POA: Diagnosis not present

## 2020-07-14 DIAGNOSIS — Z79899 Other long term (current) drug therapy: Secondary | ICD-10-CM | POA: Diagnosis not present

## 2020-07-14 LAB — CMP (CANCER CENTER ONLY)
ALT: 42 U/L (ref 0–44)
AST: 116 U/L — ABNORMAL HIGH (ref 15–41)
Albumin: 3.1 g/dL — ABNORMAL LOW (ref 3.5–5.0)
Alkaline Phosphatase: 552 U/L — ABNORMAL HIGH (ref 38–126)
Anion gap: 6 (ref 5–15)
BUN: 15 mg/dL (ref 8–23)
CO2: 32 mmol/L (ref 22–32)
Calcium: 9.2 mg/dL (ref 8.9–10.3)
Chloride: 102 mmol/L (ref 98–111)
Creatinine: 0.68 mg/dL (ref 0.44–1.00)
GFR, Estimated: >60 mL/min (ref >60)
Glucose, Bld: 150 mg/dL — ABNORMAL HIGH (ref 70–99)
Potassium: 3.4 mmol/L — ABNORMAL LOW (ref 3.5–5.1)
Sodium: 140 mmol/L (ref 135–145)
Total Bilirubin: 0.7 mg/dL (ref 0.3–1.2)
Total Protein: 6 g/dL — ABNORMAL LOW (ref 6.5–8.1)

## 2020-07-14 LAB — CBC WITH DIFFERENTIAL (CANCER CENTER ONLY)
Abs Immature Granulocytes: 0.05 10*3/uL (ref 0.00–0.07)
Basophils Absolute: 0 10*3/uL (ref 0.0–0.1)
Basophils Relative: 0 %
Eosinophils Absolute: 0 10*3/uL (ref 0.0–0.5)
Eosinophils Relative: 0 %
HCT: 29.5 % — ABNORMAL LOW (ref 36.0–46.0)
Hemoglobin: 9.3 g/dL — ABNORMAL LOW (ref 12.0–15.0)
Immature Granulocytes: 1 %
Lymphocytes Relative: 8 %
Lymphs Abs: 0.6 10*3/uL — ABNORMAL LOW (ref 0.7–4.0)
MCH: 27.4 pg (ref 26.0–34.0)
MCHC: 31.5 g/dL (ref 30.0–36.0)
MCV: 86.8 fL (ref 80.0–100.0)
Monocytes Absolute: 0.7 10*3/uL (ref 0.1–1.0)
Monocytes Relative: 9 %
Neutro Abs: 5.8 10*3/uL (ref 1.7–7.7)
Neutrophils Relative %: 82 %
Platelet Count: 241 10*3/uL (ref 150–400)
RBC: 3.4 MIL/uL — ABNORMAL LOW (ref 3.87–5.11)
RDW: 15.7 % — ABNORMAL HIGH (ref 11.5–15.5)
WBC Count: 7.1 10*3/uL (ref 4.0–10.5)
nRBC: 0 % (ref 0.0–0.2)

## 2020-07-14 LAB — IRON AND TIBC
Iron: 30 ug/dL (ref 28–170)
Saturation Ratios: 17 % (ref 10.4–31.8)
TIBC: 178 ug/dL — ABNORMAL LOW (ref 250–450)
UIBC: 148 ug/dL

## 2020-07-14 LAB — SAMPLE TO BLOOD BANK

## 2020-07-14 LAB — PREPARE RBC (CROSSMATCH)

## 2020-07-14 LAB — SAVE SMEAR(SSMR), FOR PROVIDER SLIDE REVIEW

## 2020-07-14 LAB — FERRITIN: Ferritin: 1502 ng/mL — ABNORMAL HIGH (ref 11–307)

## 2020-07-14 MED ORDER — LONSURF 20-8.19 MG PO TABS: 35.0000 mg/m2 | ORAL_TABLET | Freq: Every day | ORAL | 3 refills | Status: DC

## 2020-07-14 NOTE — Progress Notes (Signed)
Hematology and Oncology Follow Up Visit  Tonya [name] [id number] 1941-01-15 80 y.o. 07/14/2020   Principle Diagnosis:  Stage IV adenocarcinoma of the ascending colon -- NRAS+, BRAF-, HER2-, MSI/MMR -,  Iron deficiency anemia DVT in LEFT leg-- Peroneal and popliteal vein  Past Therapy: FOLFOX - s/p cycle5   Current Therapy: FOLFIRI/Bevacizumab -started 01/20/2019,s/p cycle #16 -- Avastin d/c'ed on 10/21/2019 due to DVT IV Iron as indicated Eliquis 5 mg po BID -- started on 09/30/2019 --DC on 04/01/2020 secondary to GI bleeding -- re-started on 05/03/2020 Xeloda with radiation therapy.  To start on 04/08/2020.  Patient will take 1500 mg of Xeloda daily. Lonsurf 60 mg po q day (5 on - 2 off - 5 on -- 2 weeks off)   Interim History:  Tonya [name] is here today with her daughter for follow-up.  Unfortunately, her CT scan certainly is no better.  In fact, the CT scan is much worse.  She has extensive hepatic disease.  There are new enlarging hepatic mets.  She has worsened disease about the right colon.  She has a right lower lobe pulmonary nodule.  She has involvement of the right ureter without hydronephrosis.  Her last CEA was up to 37.  I think that we clearly are looking at his situation that we have very little options for.  We cannot use EGFR inhibitors because her tumor is NRAS (+).  I really think the only option that we would have his lung serve.  Her performance status is not that great-ECOG 3-but she just is not ready to stop therapy.  She is aware that what we do at this point is not going to get rid of the cancer.  She has a lot of faith.  As always we had a very good prayer.  We will try Lonsurf.  However, I doubt that she is going to tolerate twice a day treatment.  I think daily treatment per protocol would be the best way for her.  She really is not getting out of bed much.  She is not eating much.  She is having some pain on the right side of her abdomen from  the liver metastasis.  There is been no problems with fever.  She has not had any issues with bleeding.  She does have a blood clot in her legs bilaterally.  I think she does have an IVC filter in.  We cannot anticoagulate her because of her risk of bleeding.  I just wish that she would have a better quality of life.   Medications:  Allergies as of 07/14/2020      Reactions   Hydrocodone Swelling, Other (See Comments)   "I started swelling, became red, and passed out"   Iohexol Hives, Shortness Of Breath, Other (See Comments)   Patient developed hives and fullness in throat post injection of 125cc's Omni 300, Onset Date: 11/15/2006   Naproxen Other (See Comments)   "It made me feel out of my head"   Ibuprofen Nausea Only   Propoxyphene N-acetaminophen Other (See Comments)   "I couldn't find the door to make my way out of the room- I was in misery"      Medication List       Accurate as of July 14, 2020 11:23 AM. If you have any questions, ask your nurse or doctor.        apixaban 5 MG Tabs tablet Commonly known as: ELIQUIS Take 1 tablet (5 mg total) by mouth 2 (two)  times daily.   atorvastatin 10 MG tablet Commonly known as: LIPITOR Take 1 tablet (10 mg total) by mouth daily.   B-D SINGLE USE SWABS REGULAR Pads 1 each by Does not apply route as needed (to cleanse site prior to obtaining droplet of blood for CBG's). E11.9   B-D ULTRAFINE III SHORT PEN 31G X 8 MM Misc Generic drug: Insulin Pen Needle   famotidine 40 MG tablet Commonly known as: Pepcid Take 1 tablet (40 mg total) by mouth 2 (two) times daily.   furosemide 40 MG tablet Commonly known as: LASIX Take 1 tablet (40 mg total) by mouth daily.   ketoconazole 2 % cream Commonly known as: NIZORAL Apply 1 application topically 2 (two) times daily.   lidocaine-prilocaine cream Commonly known as: EMLA Apply 1 application topically as needed for up to 30 doses.   magic mouthwash Soln Swish and swallow equal  parts Benadryl, Lidocaine and Nystatin.  5- 10 ml.'s four times a day as needed.   meclizine 12.5 MG tablet Commonly known as: ANTIVERT TAKE 1 TABLET(12.5 MG) BY MOUTH THREE TIMES DAILY AS NEEDED FOR DIZZINESS   metolazone 5 MG tablet Commonly known as: ZAROXOLYN Take 1 tablet (5 mg total) by mouth daily as needed (Excess fluid in leg). Take 60 minutes BEFORE Lasix   metoprolol succinate 25 MG 24 hr tablet Commonly known as: TOPROL-XL 25 mg daily.   potassium chloride SA 20 MEQ tablet Commonly known as: KLOR-CON Take 1 tablet (20 mEq total) by mouth daily.   prochlorperazine 10 MG tablet Commonly known as: COMPAZINE Take 1 tablet (10 mg total) by mouth every 6 (six) hours as needed (Nausea or vomiting).   terbinafine 1 % cream Commonly known as: LAMISIL Apply topically 2 (two) times daily. PRN   traMADol 50 MG tablet Commonly known as: ULTRAM TAKE 1 TABLET(50 MG) BY MOUTH EVERY 6 HOURS AS NEEDED   Tresiba FlexTouch 100 UNIT/ML FlexTouch Pen Generic drug: insulin degludec Inject 0.21 mLs (21 Units total) into the skin daily. Take 34 units on chemo days   True Metrix Blood Glucose Test test strip Generic drug: glucose blood TEST BLOOD SUGAR TWICE DAILY   TRUEplus Lancets 30G Misc 1 each by Does not apply route 2 (two) times daily. E11.9   vitamin B-12 500 MCG tablet Commonly known as: CYANOCOBALAMIN Take 500 mcg by mouth daily.       Allergies:  Allergies  Allergen Reactions  . Hydrocodone Swelling and Other (See Comments)    "I started swelling, became red, and passed out"  . Iohexol Hives, Shortness Of Breath and Other (See Comments)    Patient developed hives and fullness in throat post injection of 125cc's Omni 300, Onset Date: 11/15/2006   . Naproxen Other (See Comments)    "It made me feel out of my head"  . Ibuprofen Nausea Only  . Propoxyphene N-Acetaminophen Other (See Comments)    "I couldn't find the door to make my way out of the room- I was in  misery"    Past Medical History, Surgical history, Social history, and Family History were reviewed and updated.  Review of Systems: Review of Systems  Constitutional: Negative.   HENT: Negative.   Eyes: Negative.   Respiratory: Negative.   Cardiovascular: Negative.   Gastrointestinal: Negative.   Genitourinary: Negative.   Musculoskeletal: Negative.   Skin: Negative.   Neurological: Negative.   Endo/Heme/Allergies: Negative.   Psychiatric/Behavioral: Negative.      Physical Exam:  vitals were not  taken for this visit.   Wt Readings from Last 3 Encounters:  07/12/20 161 lb (73 kg)  02/16/20 182 lb (82.6 kg)  01/29/20 187 lb 12.8 oz (85.2 kg)    Physical Exam Vitals reviewed.  HENT:     Head: Normocephalic and atraumatic.  Eyes:     Pupils: Pupils are equal, round, and reactive to light.  Cardiovascular:     Rate and Rhythm: Normal rate and regular rhythm.     Heart sounds: Normal heart sounds.  Pulmonary:     Effort: Pulmonary effort is normal.     Breath sounds: Normal breath sounds.  Abdominal:     General: Bowel sounds are normal.     Palpations: Abdomen is soft.  Musculoskeletal:        General: No tenderness or deformity. Normal range of motion.     Cervical back: Normal range of motion.     Comments: She has a brace on the right lower leg.  Lymphadenopathy:     Cervical: No cervical adenopathy.  Skin:    General: Skin is warm and dry.     Findings: No erythema or rash.  Neurological:     Mental Status: She is alert and oriented to person, place, and time.     Comments: She has some chronic weakness over on the left side from past stroke.  Psychiatric:        Behavior: Behavior normal.        Thought Content: Thought content normal.        Judgment: Judgment normal.      Lab Results  Component Value Date   WBC 7.1 07/14/2020   HGB 9.3 (L) 07/14/2020   HCT 29.5 (L) 07/14/2020   MCV 86.8 07/14/2020   PLT 241 07/14/2020   Lab Results   Component Value Date   FERRITIN 1,841 (H) 06/23/2020   IRON 24 (L) 06/23/2020   TIBC 193 (L) 06/23/2020   UIBC 169 06/23/2020   IRONPCTSAT 12 (L) 06/23/2020   Lab Results  Component Value Date   RETICCTPCT 4.1 (H) 06/23/2020   RBC 3.40 (L) 07/14/2020   RETICCTABS 62,400 05/17/2016   No results found for: KPAFRELGTCHN, LAMBDASER, KAPLAMBRATIO No results found for: IGGSERUM, IGA, IGMSERUM No results found for: Odetta Pink, SPEI   Chemistry      Component Value Date/Time   NA 141 06/23/2020 0954   NA 139 09/30/2018 1311   K 3.6 06/23/2020 0954   CL 102 06/23/2020 0954   CO2 31 06/23/2020 0954   BUN 20 06/23/2020 0954   BUN 10 09/30/2018 1311   CREATININE 0.74 06/23/2020 0954      Component Value Date/Time   CALCIUM 9.7 06/23/2020 0954   ALKPHOS 233 (H) 06/23/2020 0954   AST 74 (H) 06/23/2020 0954   ALT 25 06/23/2020 0954   BILITOT 0.8 06/23/2020 0954       Impression and Plan: Tonya [name] is a very pleasant 80 yo African American female with metastatic colon cancer.  Again, she does have progressive disease.  She underwent radiation and chemotherapy.  She had 28 days of radiation therapy along with daily Xeloda.  It is apparent that this really did not do much for [place].  This clearly is her last chance to treat Ms. Downard.  Again Lonsurf I think probably has a 10% chance of helping her.  She just wants to try.  We will going give her 1 unit of blood.  I realize her hemoglobin is 9 that low but yet given that she is on treatment and will be taking a treatment that is myelosuppressive, I do think that 1 unit of blood will help her.  We are always checking her iron levels to see how they are.  I we will try to get Lonsurf started next week.  We will send the prescription in.  I talked to her about the side effects.  She understands all of this.  She agrees to take the [place].  Her daughter also agrees.  I told her that if  she does have any side effects and wants to stop we can always stop the Lonsurf.  I will try to get her back to see me in about 3-4 weeks.  [name], MD 3/23/202211:23 AM

## 2020-07-14 NOTE — Telephone Encounter (Signed)
Oral Oncology Pharmacist Encounter   Received notification from [facility] that prior authorization for [place] is required.   PA submitted on [facility] [id number]  Status is pending   Oral Oncology Clinic will continue to follow.   Darl Pikes, PharmD, BCPS, [facility] Hematology/Oncology Clinical Pharmacist [facility] [place] [facility] [phone]  07/14/2020 3:48 PM

## 2020-07-14 NOTE — Telephone Encounter (Signed)
Oral Oncology Pharmacist Encounter   Prior Authorization for Tonya [name] has been approved.     PA# [id number] Effective dates: 07/14/20 through 01/10/21   Oral Oncology Clinic will continue to follow.   Darl Pikes, PharmD, BCPS. BCOP Hematology/Oncology Clinical Pharmacist [facility] [phone]  07/14/2020 3:51 PM

## 2020-07-14 NOTE — Telephone Encounter (Signed)
Oral Oncology Pharmacist Encounter  Received new prescription for Lonsurf (trifluridine-tipiracil) for the treatment of metastatic colon cancer, planned duration until disease progression or unacceptable drug toxicity.  CMP from 07/14/20 assessed, no relevant lab abnormalities. AST elevation likely due to liver metastases. Prescription dose and frequency assessed. MD reducing her to daily dosing due to concerns about tolerability.  Current medication list in Epic reviewed, no DDIs with Lonsurf identified.  Evaluated chart and no patient barriers to medication adherence identified. She has managed oral chemotherapy in the past.  Prescription has been e-scribed to the [facility] for benefits analysis and approval.  Oral Oncology Clinic will continue to follow for insurance authorization, copayment issues, initial counseling and start date.  Patient agreed to treatment on 07/14/20 per MD documentation.  Darl Pikes, PharmD, BCPS, BCOP, CPP Hematology/Oncology Clinical Pharmacist Practitioner [facility] [phone]  07/14/2020 3:19 PM

## 2020-07-15 ENCOUNTER — Telehealth: Payer: Self-pay | Admitting: Pharmacy Technician

## 2020-07-15 ENCOUNTER — Other Ambulatory Visit (HOSPITAL_BASED_OUTPATIENT_CLINIC_OR_DEPARTMENT_OTHER): Payer: Self-pay

## 2020-07-15 ENCOUNTER — Inpatient Hospital Stay: Payer: Medicare HMO

## 2020-07-15 ENCOUNTER — Other Ambulatory Visit: Payer: Self-pay

## 2020-07-15 VITALS — BP 160/75 | HR 71 | Temp 98.9°F | Resp 19

## 2020-07-15 DIAGNOSIS — D500 Iron deficiency anemia secondary to blood loss (chronic): Secondary | ICD-10-CM

## 2020-07-15 DIAGNOSIS — C182 Malignant neoplasm of ascending colon: Secondary | ICD-10-CM | POA: Diagnosis not present

## 2020-07-15 DIAGNOSIS — Z79899 Other long term (current) drug therapy: Secondary | ICD-10-CM | POA: Diagnosis not present

## 2020-07-15 DIAGNOSIS — C189 Malignant neoplasm of colon, unspecified: Secondary | ICD-10-CM

## 2020-07-15 DIAGNOSIS — Z9221 Personal history of antineoplastic chemotherapy: Secondary | ICD-10-CM | POA: Diagnosis not present

## 2020-07-15 DIAGNOSIS — Z923 Personal history of irradiation: Secondary | ICD-10-CM | POA: Diagnosis not present

## 2020-07-15 DIAGNOSIS — Z86718 Personal history of other venous thrombosis and embolism: Secondary | ICD-10-CM | POA: Diagnosis not present

## 2020-07-15 LAB — CEA (IN HOUSE-CHCC): CEA (CHCC-In House): 57.44 ng/mL — ABNORMAL HIGH (ref 0.00–5.00)

## 2020-07-15 MED ORDER — SODIUM CHLORIDE 0.9% IV SOLUTION
250.0000 mL | Freq: Once | INTRAVENOUS | Status: AC
  Administered 2020-07-15: 250 mL via INTRAVENOUS
  Filled 2020-07-15: qty 250

## 2020-07-15 MED ORDER — DIPHENHYDRAMINE HCL 25 MG PO CAPS
ORAL_CAPSULE | ORAL | Status: AC
  Filled 2020-07-15: qty 1

## 2020-07-15 MED ORDER — ACETAMINOPHEN 325 MG PO TABS
650.0000 mg | ORAL_TABLET | Freq: Once | ORAL | Status: AC
  Administered 2020-07-15: 650 mg via ORAL

## 2020-07-15 MED ORDER — DIPHENHYDRAMINE HCL 25 MG PO CAPS
25.0000 mg | ORAL_CAPSULE | Freq: Once | ORAL | Status: AC
  Administered 2020-07-15: 25 mg via ORAL

## 2020-07-15 MED ORDER — SODIUM CHLORIDE 0.9 % IV SOLN
200.0000 mg | Freq: Once | INTRAVENOUS | Status: AC
  Administered 2020-07-15: 200 mg via INTRAVENOUS
  Filled 2020-07-15: qty 200

## 2020-07-15 MED ORDER — ACETAMINOPHEN 325 MG PO TABS
ORAL_TABLET | ORAL | Status: AC
  Filled 2020-07-15: qty 2

## 2020-07-15 MED ORDER — HEPARIN SOD (PORK) LOCK FLUSH 100 UNIT/ML IV SOLN
500.0000 [IU] | Freq: Once | INTRAVENOUS | Status: AC
  Administered 2020-07-15: 500 [IU] via INTRAVENOUS
  Filled 2020-07-15: qty 5

## 2020-07-15 MED ORDER — SODIUM CHLORIDE 0.9% FLUSH
10.0000 mL | Freq: Once | INTRAVENOUS | Status: AC
  Administered 2020-07-15: 10 mL via INTRAVENOUS
  Filled 2020-07-15: qty 10

## 2020-07-15 NOTE — Telephone Encounter (Signed)
Oral Oncology Patient Advocate Encounter  Called and spoke with patients daughter, [name], to inform her of the prior auth approval for Lonsurf and high copay.  Informed patient that we could apply for patient assistance through the manufacturer so she could receive Lonsurf at no cost.   Emailed application forms to [employer] and she will review and send back to me when complete.  [place] Patient [place] Phone [phone] Fax [phone] 07/15/2020 11:50 AM

## 2020-07-16 LAB — BPAM RBC
Blood Product Expiration Date: 202204182359
ISSUE DATE / TIME: 202203240735
Unit Type and Rh: 6200

## 2020-07-16 LAB — TYPE AND SCREEN
ABO/RH(D): A POS
Antibody Screen: NEGATIVE
Unit division: 0

## 2020-07-19 NOTE — Telephone Encounter (Signed)
Daughter called and said she did not receive my email. Resent email to [employer] on 3/25.

## 2020-07-20 ENCOUNTER — Other Ambulatory Visit: Payer: Self-pay | Admitting: Hematology & Oncology

## 2020-07-20 DIAGNOSIS — Z7901 Long term (current) use of anticoagulants: Secondary | ICD-10-CM | POA: Diagnosis not present

## 2020-07-20 DIAGNOSIS — I4891 Unspecified atrial fibrillation: Secondary | ICD-10-CM | POA: Diagnosis not present

## 2020-07-20 DIAGNOSIS — I69354 Hemiplegia and hemiparesis following cerebral infarction affecting left non-dominant side: Secondary | ICD-10-CM | POA: Diagnosis not present

## 2020-07-20 DIAGNOSIS — C229 Malignant neoplasm of liver, not specified as primary or secondary: Secondary | ICD-10-CM | POA: Diagnosis not present

## 2020-07-20 DIAGNOSIS — S82831D Other fracture of upper and lower end of right fibula, subsequent encounter for closed fracture with routine healing: Secondary | ICD-10-CM | POA: Diagnosis not present

## 2020-07-20 DIAGNOSIS — Z794 Long term (current) use of insulin: Secondary | ICD-10-CM | POA: Diagnosis not present

## 2020-07-20 DIAGNOSIS — C189 Malignant neoplasm of colon, unspecified: Secondary | ICD-10-CM | POA: Diagnosis not present

## 2020-07-20 DIAGNOSIS — E119 Type 2 diabetes mellitus without complications: Secondary | ICD-10-CM | POA: Diagnosis not present

## 2020-07-20 DIAGNOSIS — I10 Essential (primary) hypertension: Secondary | ICD-10-CM | POA: Diagnosis not present

## 2020-07-20 NOTE — Telephone Encounter (Signed)
Patient has a high out of pocket cost for Lonsurf. Tonya [name] is in the process os applying for manufacturer assistance.

## 2020-07-21 DIAGNOSIS — E785 Hyperlipidemia, unspecified: Secondary | ICD-10-CM | POA: Diagnosis not present

## 2020-07-21 DIAGNOSIS — I10 Essential (primary) hypertension: Secondary | ICD-10-CM | POA: Diagnosis not present

## 2020-07-21 DIAGNOSIS — E1169 Type 2 diabetes mellitus with other specified complication: Secondary | ICD-10-CM | POA: Diagnosis not present

## 2020-07-21 DIAGNOSIS — D500 Iron deficiency anemia secondary to blood loss (chronic): Secondary | ICD-10-CM | POA: Diagnosis not present

## 2020-07-21 DIAGNOSIS — C189 Malignant neoplasm of colon, unspecified: Secondary | ICD-10-CM | POA: Diagnosis not present

## 2020-07-21 DIAGNOSIS — D630 Anemia in neoplastic disease: Secondary | ICD-10-CM | POA: Diagnosis not present

## 2020-07-22 ENCOUNTER — Other Ambulatory Visit (HOSPITAL_COMMUNITY): Payer: Self-pay

## 2020-07-22 DIAGNOSIS — Z794 Long term (current) use of insulin: Secondary | ICD-10-CM | POA: Diagnosis not present

## 2020-07-22 DIAGNOSIS — C189 Malignant neoplasm of colon, unspecified: Secondary | ICD-10-CM | POA: Diagnosis not present

## 2020-07-22 DIAGNOSIS — E119 Type 2 diabetes mellitus without complications: Secondary | ICD-10-CM | POA: Diagnosis not present

## 2020-07-22 DIAGNOSIS — S82831D Other fracture of upper and lower end of right fibula, subsequent encounter for closed fracture with routine healing: Secondary | ICD-10-CM | POA: Diagnosis not present

## 2020-07-22 DIAGNOSIS — I4891 Unspecified atrial fibrillation: Secondary | ICD-10-CM | POA: Diagnosis not present

## 2020-07-22 DIAGNOSIS — C229 Malignant neoplasm of liver, not specified as primary or secondary: Secondary | ICD-10-CM | POA: Diagnosis not present

## 2020-07-22 DIAGNOSIS — I10 Essential (primary) hypertension: Secondary | ICD-10-CM | POA: Diagnosis not present

## 2020-07-22 DIAGNOSIS — I69354 Hemiplegia and hemiparesis following cerebral infarction affecting left non-dominant side: Secondary | ICD-10-CM | POA: Diagnosis not present

## 2020-07-22 DIAGNOSIS — Z7901 Long term (current) use of anticoagulants: Secondary | ICD-10-CM | POA: Diagnosis not present

## 2020-07-22 NOTE — Telephone Encounter (Signed)
Due to technological issues, [name] was unable to fax or email me the signed application.  She wanted me to call Meredith Mody, her sister, and send the papers to her to have signed and returned.  Emailed papers to [place] and also scanned them to her email from the fax machine.  Once papers are received I will fax application.  [place] Patient [place] Phone [phone] Fax [phone] 07/22/2020 12:01 PM

## 2020-07-23 NOTE — Telephone Encounter (Signed)
Oral Oncology Patient Advocate Encounter  Application completed and faxed to [phone] on 07/22/20.   [employer] patient assistance phone number for follow up is [phone].   This encounter will be updated until final determination.   [place] Patient [place] Phone [phone] Fax [phone] 07/23/2020 9:05 AM

## 2020-07-24 DIAGNOSIS — E119 Type 2 diabetes mellitus without complications: Secondary | ICD-10-CM | POA: Diagnosis not present

## 2020-07-24 DIAGNOSIS — S82831D Other fracture of upper and lower end of right fibula, subsequent encounter for closed fracture with routine healing: Secondary | ICD-10-CM | POA: Diagnosis not present

## 2020-07-24 DIAGNOSIS — C229 Malignant neoplasm of liver, not specified as primary or secondary: Secondary | ICD-10-CM | POA: Diagnosis not present

## 2020-07-24 DIAGNOSIS — I4891 Unspecified atrial fibrillation: Secondary | ICD-10-CM | POA: Diagnosis not present

## 2020-07-24 DIAGNOSIS — C189 Malignant neoplasm of colon, unspecified: Secondary | ICD-10-CM | POA: Diagnosis not present

## 2020-07-24 DIAGNOSIS — Z7901 Long term (current) use of anticoagulants: Secondary | ICD-10-CM | POA: Diagnosis not present

## 2020-07-24 DIAGNOSIS — Z794 Long term (current) use of insulin: Secondary | ICD-10-CM | POA: Diagnosis not present

## 2020-07-24 DIAGNOSIS — I10 Essential (primary) hypertension: Secondary | ICD-10-CM | POA: Diagnosis not present

## 2020-07-24 DIAGNOSIS — I69354 Hemiplegia and hemiparesis following cerebral infarction affecting left non-dominant side: Secondary | ICD-10-CM | POA: Diagnosis not present

## 2020-07-26 ENCOUNTER — Other Ambulatory Visit: Payer: Self-pay

## 2020-07-26 ENCOUNTER — Ambulatory Visit (INDEPENDENT_AMBULATORY_CARE_PROVIDER_SITE_OTHER): Payer: Medicare HMO | Admitting: Podiatry

## 2020-07-26 DIAGNOSIS — B351 Tinea unguium: Secondary | ICD-10-CM | POA: Diagnosis not present

## 2020-07-26 DIAGNOSIS — M79675 Pain in left toe(s): Secondary | ICD-10-CM

## 2020-07-26 DIAGNOSIS — E1149 Type 2 diabetes mellitus with other diabetic neurological complication: Secondary | ICD-10-CM

## 2020-07-26 DIAGNOSIS — Z794 Long term (current) use of insulin: Secondary | ICD-10-CM

## 2020-07-26 DIAGNOSIS — M79674 Pain in right toe(s): Secondary | ICD-10-CM

## 2020-07-27 DIAGNOSIS — Z794 Long term (current) use of insulin: Secondary | ICD-10-CM | POA: Diagnosis not present

## 2020-07-27 DIAGNOSIS — C189 Malignant neoplasm of colon, unspecified: Secondary | ICD-10-CM | POA: Diagnosis not present

## 2020-07-27 DIAGNOSIS — E119 Type 2 diabetes mellitus without complications: Secondary | ICD-10-CM | POA: Diagnosis not present

## 2020-07-27 DIAGNOSIS — Z7901 Long term (current) use of anticoagulants: Secondary | ICD-10-CM | POA: Diagnosis not present

## 2020-07-27 DIAGNOSIS — I10 Essential (primary) hypertension: Secondary | ICD-10-CM | POA: Diagnosis not present

## 2020-07-27 DIAGNOSIS — I69354 Hemiplegia and hemiparesis following cerebral infarction affecting left non-dominant side: Secondary | ICD-10-CM | POA: Diagnosis not present

## 2020-07-27 DIAGNOSIS — S82831D Other fracture of upper and lower end of right fibula, subsequent encounter for closed fracture with routine healing: Secondary | ICD-10-CM | POA: Diagnosis not present

## 2020-07-27 DIAGNOSIS — C229 Malignant neoplasm of liver, not specified as primary or secondary: Secondary | ICD-10-CM | POA: Diagnosis not present

## 2020-07-27 DIAGNOSIS — I4891 Unspecified atrial fibrillation: Secondary | ICD-10-CM | POA: Diagnosis not present

## 2020-07-28 ENCOUNTER — Encounter: Payer: Self-pay | Admitting: Podiatry

## 2020-07-28 NOTE — Telephone Encounter (Signed)
[employer] rep called and left message on 07/27/20 to have income corrected on application and refaxed.   Corrected and faxed back to [phone].  Once [employer] receives corrected income they will finish processing application and reach out to patient with determination.  [place] Patient Tonya [name] Phone [phone] Fax [phone] 07/28/2020 8:27 AM

## 2020-07-28 NOTE — Progress Notes (Signed)
  Subjective:  Patient ID: Tonya [name], female    DOB: 04-Sep-1940,  MRN: [id number]  Chief Complaint  Patient presents with  . Nail Problem    Thick painful toenails  . Diabetes    A1C  7.6    80 y.o. female returns with the above complaint. History confirmed with patient.  Here with her daughter today.  I have not seen her in some time, unfortunately she had a fractured leg and fall has been in and out of the hospital recently  Objective:  Physical Exam: Bilaterally there is +2 on the right and +3 on the left pitting edema.  Nonpalpable pedal pulses secondary to this edema.  Skin is warm well perfused with normal capillary refill time.  There is onychomycosis to all 10 toenails with incurvation and pincer nail deformity of the hallux toenails and pain on palpation.  No preulcerative calluses.  No prominence.  She has notable weakness of the left side compared to the right.  She is in a wheelchair today  Assessment:   1. Onychomycosis   2. Pain due to onychomycosis of toenails of both feet   3. Type 2 diabetes mellitus with other neurologic complication, with long-term current use of insulin ([place])      Plan:  Patient was evaluated and treated and all questions answered.  Patient educated on diabetes. Discussed proper diabetic foot care and discussed risks and complications of disease. Educated patient in depth on reasons to return to the office immediately should he/she discover anything concerning or new on the feet. All questions answered. Discussed proper shoes as well.    Discussed the etiology and treatment options for the condition in detail with the patient. Educated patient on the topical and oral treatment options for mycotic nails. Recommended debridement of the nails today. Sharp and mechanical debridement performed of all painful and mycotic nails today. Nails debrided in length and thickness using a nail nipper and a mechanical burr to level of comfort. Discussed  treatment options including appropriate shoe gear. Follow up as needed for painful nails.      Return in about 3 months (around 10/25/2020) for at risk diabetic foot care.

## 2020-07-28 NOTE — Telephone Encounter (Signed)
Oral Oncology Patient Advocate Encounter  Received notification from [facility] Patient Assistance program that patient has been successfully enrolled into their program to receive Lonsurf from the manufacturer at $0 out of pocket until 04/23/21.    I called and spoke with the patients daughter, [name], and she knows she will have to re-apply.   Specialty Pharmacy that will dispense medication is [facility].  Patient knows to call the office with questions or concerns.   Oral Oncology Clinic will continue to follow.  [place] Patient [place] Phone [phone] Fax [phone] 07/28/2020 1:55 PM

## 2020-08-03 DIAGNOSIS — C229 Malignant neoplasm of liver, not specified as primary or secondary: Secondary | ICD-10-CM | POA: Diagnosis not present

## 2020-08-03 DIAGNOSIS — S82831D Other fracture of upper and lower end of right fibula, subsequent encounter for closed fracture with routine healing: Secondary | ICD-10-CM | POA: Diagnosis not present

## 2020-08-03 DIAGNOSIS — Z794 Long term (current) use of insulin: Secondary | ICD-10-CM | POA: Diagnosis not present

## 2020-08-03 DIAGNOSIS — I69354 Hemiplegia and hemiparesis following cerebral infarction affecting left non-dominant side: Secondary | ICD-10-CM | POA: Diagnosis not present

## 2020-08-03 DIAGNOSIS — I10 Essential (primary) hypertension: Secondary | ICD-10-CM | POA: Diagnosis not present

## 2020-08-03 DIAGNOSIS — C189 Malignant neoplasm of colon, unspecified: Secondary | ICD-10-CM | POA: Diagnosis not present

## 2020-08-03 DIAGNOSIS — Z7901 Long term (current) use of anticoagulants: Secondary | ICD-10-CM | POA: Diagnosis not present

## 2020-08-03 DIAGNOSIS — I4891 Unspecified atrial fibrillation: Secondary | ICD-10-CM | POA: Diagnosis not present

## 2020-08-03 DIAGNOSIS — E119 Type 2 diabetes mellitus without complications: Secondary | ICD-10-CM | POA: Diagnosis not present

## 2020-08-05 DIAGNOSIS — I69354 Hemiplegia and hemiparesis following cerebral infarction affecting left non-dominant side: Secondary | ICD-10-CM | POA: Diagnosis not present

## 2020-08-05 DIAGNOSIS — I4891 Unspecified atrial fibrillation: Secondary | ICD-10-CM | POA: Diagnosis not present

## 2020-08-05 DIAGNOSIS — Z7901 Long term (current) use of anticoagulants: Secondary | ICD-10-CM | POA: Diagnosis not present

## 2020-08-05 DIAGNOSIS — I10 Essential (primary) hypertension: Secondary | ICD-10-CM | POA: Diagnosis not present

## 2020-08-05 DIAGNOSIS — C229 Malignant neoplasm of liver, not specified as primary or secondary: Secondary | ICD-10-CM | POA: Diagnosis not present

## 2020-08-05 DIAGNOSIS — E119 Type 2 diabetes mellitus without complications: Secondary | ICD-10-CM | POA: Diagnosis not present

## 2020-08-05 DIAGNOSIS — C189 Malignant neoplasm of colon, unspecified: Secondary | ICD-10-CM | POA: Diagnosis not present

## 2020-08-05 DIAGNOSIS — Z794 Long term (current) use of insulin: Secondary | ICD-10-CM | POA: Diagnosis not present

## 2020-08-05 DIAGNOSIS — S82831D Other fracture of upper and lower end of right fibula, subsequent encounter for closed fracture with routine healing: Secondary | ICD-10-CM | POA: Diagnosis not present

## 2020-08-10 ENCOUNTER — Encounter: Payer: Self-pay | Admitting: Family

## 2020-08-10 ENCOUNTER — Inpatient Hospital Stay: Payer: Medicare HMO

## 2020-08-10 ENCOUNTER — Inpatient Hospital Stay: Payer: Medicare HMO | Attending: Hematology & Oncology

## 2020-08-10 ENCOUNTER — Inpatient Hospital Stay (HOSPITAL_BASED_OUTPATIENT_CLINIC_OR_DEPARTMENT_OTHER): Payer: Medicare HMO | Admitting: Family

## 2020-08-10 ENCOUNTER — Other Ambulatory Visit: Payer: Self-pay

## 2020-08-10 ENCOUNTER — Telehealth: Payer: Self-pay | Admitting: *Deleted

## 2020-08-10 VITALS — BP 157/82 | HR 88 | Temp 98.8°F | Resp 17 | Ht 71.0 in

## 2020-08-10 DIAGNOSIS — Z79899 Other long term (current) drug therapy: Secondary | ICD-10-CM | POA: Diagnosis not present

## 2020-08-10 DIAGNOSIS — C189 Malignant neoplasm of colon, unspecified: Secondary | ICD-10-CM

## 2020-08-10 DIAGNOSIS — R109 Unspecified abdominal pain: Secondary | ICD-10-CM | POA: Insufficient documentation

## 2020-08-10 DIAGNOSIS — D500 Iron deficiency anemia secondary to blood loss (chronic): Secondary | ICD-10-CM

## 2020-08-10 DIAGNOSIS — M7989 Other specified soft tissue disorders: Secondary | ICD-10-CM | POA: Diagnosis not present

## 2020-08-10 DIAGNOSIS — C182 Malignant neoplasm of ascending colon: Secondary | ICD-10-CM | POA: Insufficient documentation

## 2020-08-10 DIAGNOSIS — R5383 Other fatigue: Secondary | ICD-10-CM | POA: Insufficient documentation

## 2020-08-10 DIAGNOSIS — D649 Anemia, unspecified: Secondary | ICD-10-CM

## 2020-08-10 DIAGNOSIS — K922 Gastrointestinal hemorrhage, unspecified: Secondary | ICD-10-CM | POA: Insufficient documentation

## 2020-08-10 DIAGNOSIS — R531 Weakness: Secondary | ICD-10-CM | POA: Insufficient documentation

## 2020-08-10 DIAGNOSIS — I82402 Acute embolism and thrombosis of unspecified deep veins of left lower extremity: Secondary | ICD-10-CM | POA: Diagnosis not present

## 2020-08-10 DIAGNOSIS — Z95828 Presence of other vascular implants and grafts: Secondary | ICD-10-CM

## 2020-08-10 DIAGNOSIS — C787 Secondary malignant neoplasm of liver and intrahepatic bile duct: Secondary | ICD-10-CM | POA: Diagnosis not present

## 2020-08-10 LAB — CMP (CANCER CENTER ONLY)
ALT: 42 U/L (ref 0–44)
AST: 89 U/L — ABNORMAL HIGH (ref 15–41)
Albumin: 3.1 g/dL — ABNORMAL LOW (ref 3.5–5.0)
Alkaline Phosphatase: 731 U/L — ABNORMAL HIGH (ref 38–126)
Anion gap: 7 (ref 5–15)
BUN: 13 mg/dL (ref 8–23)
CO2: 28 mmol/L (ref 22–32)
Calcium: 9.6 mg/dL (ref 8.9–10.3)
Chloride: 104 mmol/L (ref 98–111)
Creatinine: 0.66 mg/dL (ref 0.44–1.00)
GFR, Estimated: >60 mL/min (ref >60)
Glucose, Bld: 116 mg/dL — ABNORMAL HIGH (ref 70–99)
Potassium: 4 mmol/L (ref 3.5–5.1)
Sodium: 139 mmol/L (ref 135–145)
Total Bilirubin: 1.1 mg/dL (ref 0.3–1.2)
Total Protein: 6.1 g/dL — ABNORMAL LOW (ref 6.5–8.1)

## 2020-08-10 LAB — PREALBUMIN: Prealbumin: 9.6 mg/dL — ABNORMAL LOW (ref 18–38)

## 2020-08-10 LAB — CBC WITH DIFFERENTIAL (CANCER CENTER ONLY)
Abs Immature Granulocytes: 0.08 10*3/uL — ABNORMAL HIGH (ref 0.00–0.07)
Basophils Absolute: 0 10*3/uL (ref 0.0–0.1)
Basophils Relative: 0 %
Eosinophils Absolute: 0 10*3/uL (ref 0.0–0.5)
Eosinophils Relative: 0 %
HCT: 31.5 % — ABNORMAL LOW (ref 36.0–46.0)
Hemoglobin: 10.1 g/dL — ABNORMAL LOW (ref 12.0–15.0)
Immature Granulocytes: 1 %
Lymphocytes Relative: 6 %
Lymphs Abs: 0.5 10*3/uL — ABNORMAL LOW (ref 0.7–4.0)
MCH: 28.1 pg (ref 26.0–34.0)
MCHC: 32.1 g/dL (ref 30.0–36.0)
MCV: 87.7 fL (ref 80.0–100.0)
Monocytes Absolute: 0.7 10*3/uL (ref 0.1–1.0)
Monocytes Relative: 8 %
Neutro Abs: 7 10*3/uL (ref 1.7–7.7)
Neutrophils Relative %: 85 %
Platelet Count: 248 10*3/uL (ref 150–400)
RBC: 3.59 MIL/uL — ABNORMAL LOW (ref 3.87–5.11)
RDW: 16.4 % — ABNORMAL HIGH (ref 11.5–15.5)
WBC Count: 8.3 10*3/uL (ref 4.0–10.5)
nRBC: 0.4 % — ABNORMAL HIGH (ref 0.0–0.2)

## 2020-08-10 LAB — RETICULOCYTES
Immature Retic Fract: 27.4 % — ABNORMAL HIGH (ref 2.3–15.9)
RBC.: 3.58 MIL/uL — ABNORMAL LOW (ref 3.87–5.11)
Retic Count, Absolute: 78.8 10*3/uL (ref 19.0–186.0)
Retic Ct Pct: 2.2 % (ref 0.4–3.1)

## 2020-08-10 LAB — SAMPLE TO BLOOD BANK

## 2020-08-10 LAB — LACTATE DEHYDROGENASE: LDH: 1047 U/L — ABNORMAL HIGH (ref 98–192)

## 2020-08-10 LAB — SAVE SMEAR(SSMR), FOR PROVIDER SLIDE REVIEW

## 2020-08-10 MED ORDER — HEPARIN SOD (PORK) LOCK FLUSH 100 UNIT/ML IV SOLN
500.0000 [IU] | Freq: Once | INTRAVENOUS | Status: AC
  Administered 2020-08-10: 500 [IU] via INTRAVENOUS
  Filled 2020-08-10: qty 5

## 2020-08-10 MED ORDER — SODIUM CHLORIDE 0.9% FLUSH
10.0000 mL | Freq: Once | INTRAVENOUS | Status: AC
  Administered 2020-08-10: 10 mL via INTRAVENOUS
  Filled 2020-08-10: qty 10

## 2020-08-10 MED ORDER — METHYLPHENIDATE HCL 10 MG PO TABS: 10.0000 mg | ORAL_TABLET | Freq: Two times a day (BID) | ORAL | 0 refills | Status: DC

## 2020-08-10 NOTE — Patient Instructions (Signed)
Implanted Port Insertion, Care After This sheet gives you information about how to care for yourself after your procedure. Your health care provider may also give you more specific instructions. If you have problems or questions, contact your health care provider. What can I expect after the procedure? After the procedure, it is common to have:  Discomfort at the port insertion site.  Bruising on the skin over the port. This should improve over 3-4 days. Follow these instructions at home: Port care  After your port is placed, you will get a manufacturer's information card. The card has information about your port. Keep this card with you at all times.  Take care of the port as told by your health care provider. Ask your health care provider if you or a family member can get training for taking care of the port at home. A home health care nurse may also take care of the port.  Make sure to remember what type of port you have. Incision care  Follow instructions from your health care provider about how to take care of your port insertion site. Make sure you: ? Wash your hands with soap and water before and after you change your bandage (dressing). If soap and water are not available, use hand sanitizer. ? Change your dressing as told by your health care provider. ? Leave stitches (sutures), skin glue, or adhesive strips in place. These skin closures may need to stay in place for 2 weeks or longer. If adhesive strip edges start to loosen and curl up, you may trim the loose edges. Do not remove adhesive strips completely unless your health care provider tells you to do that.  Check your port insertion site every day for signs of infection. Check for: ? Redness, swelling, or pain. ? Fluid or blood. ? Warmth. ? Pus or a bad smell.      Activity  Return to your normal activities as told by your health care provider. Ask your health care provider what activities are safe for you.  Do not  lift anything that is heavier than 10 lb (4.5 kg), or the limit that you are told, until your health care provider says that it is safe. General instructions  Take over-the-counter and prescription medicines only as told by your health care provider.  Do not take baths, swim, or use a hot tub until your health care provider approves. Ask your health care provider if you may take showers. You may only be allowed to take sponge baths.  Do not drive for 24 hours if you were given a sedative during your procedure.  Wear a medical alert bracelet in case of an emergency. This will tell any health care providers that you have a port.  Keep all follow-up visits as told by your health care provider. This is important. Contact a health care provider if:  You cannot flush your port with saline as directed, or you cannot draw blood from the port.  You have a fever or chills.  You have redness, swelling, or pain around your port insertion site.  You have fluid or blood coming from your port insertion site.  Your port insertion site feels warm to the touch.  You have pus or a bad smell coming from the port insertion site. Get help right away if:  You have chest pain or shortness of breath.  You have bleeding from your port that you cannot control. Summary  Take care of the port as told by your   health care provider. Keep the manufacturer's information card with you at all times.  Change your dressing as told by your health care provider.  Contact a health care provider if you have a fever or chills or if you have redness, swelling, or pain around your port insertion site.  Keep all follow-up visits as told by your health care provider. This information is not intended to replace advice given to you by your health care provider. Make sure you discuss any questions you have with your health care provider. Document Revised: 11/06/2017 Document Reviewed: 11/06/2017 Elsevier Patient Education   2021 Elsevier Inc.  

## 2020-08-10 NOTE — Progress Notes (Signed)
Hematology and Oncology Follow Up Visit  Tonya [name] [id number] Nov 30, 1940 80 y.o. 08/10/2020   Principle Diagnosis:  Stage IV adenocarcinoma of the ascending colon -- NRAS+, BRAF-, HER2-, MSI/MMR -,  Iron deficiency anemia DVT in LEFT leg-- Peroneal and popliteal vein  Past Therapy: FOLFOX - s/p cycle5 Xeloda with radiation therapy.  To started on 04/08/2020.  Patient will take 1500 mg of Xeloda daily. FOLFIRI/Bevacizumab -started 01/20/2019,s/p cycle #16 -- Avastin d/c'ed on 10/21/2019 due to DVT  Current Therapy: Lonsurf 60 mg po q day (5 on - 2 off - 5 on - 2 weeks off) - started 08/02/2020 IV Iron as indicated Eliquis 5 mg po BID -- started on 09/30/2019 --DC on 04/01/2020 secondary to GI bleeding -- re-started on 05/03/2020   Interim History:  Tonya [name] is here today with her daughter for follow-up. She was able to start last week on Monday (4/11). She had nausea, vomiting and pain in the right side all day Sunday. She has not been eating or drinking much due to the recent n/v, no desire and fatigue. Unable to stand for weight today.  She is feeling weak and very fatigued. She states that she just wants to sleep all of the time.  CEA last visit was 57.44. Her alk/phos is now up to 731 (previously 552).  Hgb 10.1, MCV 87, WBC count 8.3 and platelets 248.   No fever, cough, rash, dizziness, SOB, chest pain, palpitations or changes in bowel or bladder habits.  The chronic swelling both lower extremities is unchanged. Pedal pulses are 2+.  She is still taking Eliquis BID. I spoke with Dr. Marin Olp regarding this and he started to have her continue. If she has another issue with blood loss we will discontinue. They have not noted any blood loss. No bruising or petechiae.   ECOG Performance Status: 1 - Symptomatic but completely ambulatory  Medications:  Allergies as of 08/10/2020      Reactions   Hydrocodone Swelling, Other (See Comments)   "I started swelling, became  red, and passed out"   Iohexol Hives, Shortness Of Breath, Other (See Comments)   Patient developed hives and fullness in throat post injection of 125cc's Omni 300, Onset Date: 11/15/2006   Naproxen Other (See Comments)   "It made me feel out of my head"   Ibuprofen Nausea Only   Propoxyphene N-acetaminophen Other (See Comments)   "I couldn't find the door to make my way out of the room- I was in misery"      Medication List       Accurate as of August 10, 2020 12:08 PM. If you have any questions, ask your nurse or doctor.        apixaban 5 MG Tabs tablet Commonly known as: ELIQUIS Take 1 tablet (5 mg total) by mouth 2 (two) times daily.   atorvastatin 10 MG tablet Commonly known as: LIPITOR Take 1 tablet (10 mg total) by mouth daily.   B-D SINGLE USE SWABS REGULAR Pads 1 each by Does not apply route as needed (to cleanse site prior to obtaining droplet of blood for CBG's). E11.9   B-D ULTRAFINE III SHORT PEN 31G X 8 MM Misc Generic drug: Insulin Pen Needle   famotidine 40 MG tablet Commonly known as: Pepcid Take 1 tablet (40 mg total) by mouth 2 (two) times daily.   furosemide 40 MG tablet Commonly known as: LASIX Take 1 tablet (40 mg total) by mouth daily.   ketoconazole 2 % cream  Commonly known as: NIZORAL Apply 1 application topically 2 (two) times daily.   lidocaine-prilocaine cream Commonly known as: EMLA Apply 1 application topically as needed for up to 30 doses.   Lonsurf 20-8.19 MG tablet Generic drug: trifluridine-tipiracil TAKE 3 TABLETS (60 MG OF TRIFLURIDINE TOTAL) BY MOUTH DAILY. 1 HR AFTER AM & PM MEALS ON DAYS 1-5, 8-12. REPEAT EVERY 28DAY   magic mouthwash Soln Swish and swallow equal parts Benadryl, Lidocaine and Nystatin.  5- 10 ml.'s four times a day as needed.   meclizine 12.5 MG tablet Commonly known as: ANTIVERT TAKE 1 TABLET(12.5 MG) BY MOUTH THREE TIMES DAILY AS NEEDED FOR DIZZINESS   metolazone 5 MG tablet Commonly known as:  ZAROXOLYN Take 1 tablet (5 mg total) by mouth daily as needed (Excess fluid in leg). Take 60 minutes BEFORE Lasix   metoprolol succinate 25 MG 24 hr tablet Commonly known as: TOPROL-XL 25 mg daily.   potassium chloride SA 20 MEQ tablet Commonly known as: KLOR-CON Take 1 tablet (20 mEq total) by mouth daily.   predniSONE 20 MG tablet Commonly known as: DELTASONE TAKE 1 TABLET (20 MG TOTAL) BY MOUTH DAILY WITH BREAKFAST.   prochlorperazine 10 MG tablet Commonly known as: COMPAZINE Take 1 tablet (10 mg total) by mouth every 6 (six) hours as needed (Nausea or vomiting).   terbinafine 1 % cream Commonly known as: LAMISIL Apply topically 2 (two) times daily. PRN   traMADol 50 MG tablet Commonly known as: ULTRAM TAKE 1 TABLET(50 MG) BY MOUTH EVERY 6 HOURS AS NEEDED   Tresiba FlexTouch 100 UNIT/ML FlexTouch Pen Generic drug: insulin degludec Inject 0.21 mLs (21 Units total) into the skin daily. Take 34 units on chemo days   True Metrix Blood Glucose Test test strip Generic drug: glucose blood TEST BLOOD SUGAR TWICE DAILY   TRUEplus Lancets 30G Misc 1 each by Does not apply route 2 (two) times daily. E11.9   vitamin B-12 500 MCG tablet Commonly known as: CYANOCOBALAMIN Take 500 mcg by mouth daily.       Allergies:  Allergies  Allergen Reactions  . Hydrocodone Swelling and Other (See Comments)    "I started swelling, became red, and passed out"  . Iohexol Hives, Shortness Of Breath and Other (See Comments)    Patient developed hives and fullness in throat post injection of 125cc's Omni 300, Onset Date: 11/15/2006   . Naproxen Other (See Comments)    "It made me feel out of my head"  . Ibuprofen Nausea Only  . Propoxyphene N-Acetaminophen Other (See Comments)    "I couldn't find the door to make my way out of the room- I was in misery"    Past Medical History, Surgical history, Social history, and Family History were reviewed and updated.  Review of Systems: All  other 10 point review of systems is negative.   Physical Exam:  height is _0  (1.803 m). Her oral temperature is 98.8 F (37.1 C). Her blood pressure is 157/82 (abnormal) and her pulse is 88. Her respiration is 17 and oxygen saturation is 98%.   Wt Readings from Last 3 Encounters:  07/12/20 161 lb (73 kg)  02/16/20 182 lb (82.6 kg)  01/29/20 187 lb 12.8 oz (85.2 kg)    Ocular: Sclerae unicteric, pupils equal, round and reactive to light Ear-nose-throat: Oropharynx clear, dentition fair Lymphatic: No cervical or supraclavicular adenopathy Lungs no rales or rhonchi, good excursion bilaterally Heart regular rate and rhythm, no murmur appreciated Abd soft, nontender, positive  bowel sounds MSK no focal spinal tenderness, no joint edema Neuro: non-focal, well-oriented, appropriate affect Breasts: Deferred   Lab Results  Component Value Date   WBC 8.3 08/10/2020   HGB 10.1 (L) 08/10/2020   HCT 31.5 (L) 08/10/2020   MCV 87.7 08/10/2020   PLT 248 08/10/2020   Lab Results  Component Value Date   FERRITIN 1,502 (H) 07/14/2020   IRON 30 07/14/2020   TIBC 178 (L) 07/14/2020   UIBC 148 07/14/2020   IRONPCTSAT 17 07/14/2020   Lab Results  Component Value Date   RETICCTPCT 2.2 08/10/2020   RBC 3.59 (L) 08/10/2020   RBC 3.58 (L) 08/10/2020   RETICCTABS 62,400 05/17/2016   No results found for: KPAFRELGTCHN, LAMBDASER, KAPLAMBRATIO No results found for: IGGSERUM, IGA, IGMSERUM No results found for: Ronnald Ramp, A1GS, A2GS, Tillman Sers, SPEI   Chemistry      Component Value Date/Time   NA 139 08/10/2020 1106   NA 139 09/30/2018 1311   K 4.0 08/10/2020 1106   CL 104 08/10/2020 1106   CO2 28 08/10/2020 1106   BUN 13 08/10/2020 1106   BUN 10 09/30/2018 1311   CREATININE 0.66 08/10/2020 1106      Component Value Date/Time   CALCIUM 9.6 08/10/2020 1106   ALKPHOS 731 (H) 08/10/2020 1106   AST 89 (H) 08/10/2020 1106   ALT 42 08/10/2020 1106    BILITOT 1.1 08/10/2020 1106       Impression and Plan: Tonya [name] is a very pleasant 80 yo African American female with metastatic colon cancer, progressive. She recently started Lonsurf, this is her second week. She is fatigued and weak at this time.  She had n/v/right sided abdominal pain on Sunday.  Dr., Marin Olp also present and spoke with patient about hospice. They would prefder to give the medication a little more time to see if it will help.  We will have her start Ritalin and see if this helps with energy.  We will give her fluids and IV iron tomorrow. They were unable to stay today due to transportation.  Follow-up in 3 weeks.  They were encouraged to contact our office with any questions or concerns.   Laverna Peace, NP 4/19/202212:08 PM

## 2020-08-10 NOTE — Telephone Encounter (Signed)
Per 08/10/20 los - mailed patient calendar - view Pharmacist, community

## 2020-08-11 ENCOUNTER — Other Ambulatory Visit: Payer: Self-pay

## 2020-08-11 ENCOUNTER — Inpatient Hospital Stay: Payer: Medicare HMO

## 2020-08-11 VITALS — BP 179/78 | HR 104 | Temp 97.5°F | Resp 18

## 2020-08-11 DIAGNOSIS — D500 Iron deficiency anemia secondary to blood loss (chronic): Secondary | ICD-10-CM

## 2020-08-11 DIAGNOSIS — R109 Unspecified abdominal pain: Secondary | ICD-10-CM | POA: Diagnosis not present

## 2020-08-11 DIAGNOSIS — Z79899 Other long term (current) drug therapy: Secondary | ICD-10-CM | POA: Diagnosis not present

## 2020-08-11 DIAGNOSIS — R5383 Other fatigue: Secondary | ICD-10-CM | POA: Diagnosis not present

## 2020-08-11 DIAGNOSIS — K922 Gastrointestinal hemorrhage, unspecified: Secondary | ICD-10-CM | POA: Diagnosis not present

## 2020-08-11 DIAGNOSIS — R531 Weakness: Secondary | ICD-10-CM | POA: Diagnosis not present

## 2020-08-11 DIAGNOSIS — M7989 Other specified soft tissue disorders: Secondary | ICD-10-CM | POA: Diagnosis not present

## 2020-08-11 DIAGNOSIS — C182 Malignant neoplasm of ascending colon: Secondary | ICD-10-CM | POA: Diagnosis not present

## 2020-08-11 LAB — IRON AND TIBC
Iron: 33 ug/dL — ABNORMAL LOW (ref 41–142)
Saturation Ratios: 19 % — ABNORMAL LOW (ref 21–57)
TIBC: 168 ug/dL — ABNORMAL LOW (ref 236–444)
UIBC: 135 ug/dL (ref 120–384)

## 2020-08-11 LAB — FERRITIN: Ferritin: 1904 ng/mL — ABNORMAL HIGH (ref 11–307)

## 2020-08-11 LAB — CEA (IN HOUSE-CHCC): CEA (CHCC-In House): 90.35 ng/mL — ABNORMAL HIGH (ref 0.00–5.00)

## 2020-08-11 MED ORDER — HEPARIN SOD (PORK) LOCK FLUSH 100 UNIT/ML IV SOLN
500.0000 [IU] | Freq: Once | INTRAVENOUS | Status: AC | PRN
  Administered 2020-08-11: 500 [IU]
  Filled 2020-08-11: qty 5

## 2020-08-11 MED ORDER — SODIUM CHLORIDE 0.9% FLUSH
10.0000 mL | INTRAVENOUS | Status: DC | PRN
  Administered 2020-08-11: 10 mL
  Filled 2020-08-11: qty 10

## 2020-08-11 MED ORDER — SODIUM CHLORIDE 0.9 % IV SOLN
200.0000 mg | Freq: Once | INTRAVENOUS | Status: AC
  Administered 2020-08-11: 200 mg via INTRAVENOUS
  Filled 2020-08-11: qty 200

## 2020-08-11 MED ORDER — SODIUM CHLORIDE 0.9 % IV SOLN
Freq: Once | INTRAVENOUS | Status: AC
  Filled 2020-08-11: qty 250

## 2020-08-11 NOTE — Patient Instructions (Signed)

## 2020-08-12 DIAGNOSIS — I4891 Unspecified atrial fibrillation: Secondary | ICD-10-CM | POA: Diagnosis not present

## 2020-08-12 DIAGNOSIS — E119 Type 2 diabetes mellitus without complications: Secondary | ICD-10-CM | POA: Diagnosis not present

## 2020-08-12 DIAGNOSIS — C189 Malignant neoplasm of colon, unspecified: Secondary | ICD-10-CM | POA: Diagnosis not present

## 2020-08-12 DIAGNOSIS — S82831D Other fracture of upper and lower end of right fibula, subsequent encounter for closed fracture with routine healing: Secondary | ICD-10-CM | POA: Diagnosis not present

## 2020-08-12 DIAGNOSIS — C229 Malignant neoplasm of liver, not specified as primary or secondary: Secondary | ICD-10-CM | POA: Diagnosis not present

## 2020-08-12 DIAGNOSIS — I69354 Hemiplegia and hemiparesis following cerebral infarction affecting left non-dominant side: Secondary | ICD-10-CM | POA: Diagnosis not present

## 2020-08-12 DIAGNOSIS — Z7901 Long term (current) use of anticoagulants: Secondary | ICD-10-CM | POA: Diagnosis not present

## 2020-08-12 DIAGNOSIS — Z794 Long term (current) use of insulin: Secondary | ICD-10-CM | POA: Diagnosis not present

## 2020-08-12 DIAGNOSIS — I10 Essential (primary) hypertension: Secondary | ICD-10-CM | POA: Diagnosis not present

## 2020-08-18 ENCOUNTER — Other Ambulatory Visit: Payer: Self-pay | Admitting: Hematology & Oncology

## 2020-08-18 DIAGNOSIS — C229 Malignant neoplasm of liver, not specified as primary or secondary: Secondary | ICD-10-CM | POA: Diagnosis not present

## 2020-08-18 DIAGNOSIS — S82831D Other fracture of upper and lower end of right fibula, subsequent encounter for closed fracture with routine healing: Secondary | ICD-10-CM | POA: Diagnosis not present

## 2020-08-18 DIAGNOSIS — I10 Essential (primary) hypertension: Secondary | ICD-10-CM | POA: Diagnosis not present

## 2020-08-18 DIAGNOSIS — I69354 Hemiplegia and hemiparesis following cerebral infarction affecting left non-dominant side: Secondary | ICD-10-CM | POA: Diagnosis not present

## 2020-08-18 DIAGNOSIS — Z7901 Long term (current) use of anticoagulants: Secondary | ICD-10-CM | POA: Diagnosis not present

## 2020-08-18 DIAGNOSIS — C189 Malignant neoplasm of colon, unspecified: Secondary | ICD-10-CM | POA: Diagnosis not present

## 2020-08-18 DIAGNOSIS — I82402 Acute embolism and thrombosis of unspecified deep veins of left lower extremity: Secondary | ICD-10-CM

## 2020-08-18 DIAGNOSIS — I4891 Unspecified atrial fibrillation: Secondary | ICD-10-CM | POA: Diagnosis not present

## 2020-08-18 DIAGNOSIS — Z794 Long term (current) use of insulin: Secondary | ICD-10-CM | POA: Diagnosis not present

## 2020-08-18 DIAGNOSIS — E119 Type 2 diabetes mellitus without complications: Secondary | ICD-10-CM | POA: Diagnosis not present

## 2020-08-19 DIAGNOSIS — E119 Type 2 diabetes mellitus without complications: Secondary | ICD-10-CM | POA: Diagnosis not present

## 2020-08-19 DIAGNOSIS — C229 Malignant neoplasm of liver, not specified as primary or secondary: Secondary | ICD-10-CM | POA: Diagnosis not present

## 2020-08-19 DIAGNOSIS — I4891 Unspecified atrial fibrillation: Secondary | ICD-10-CM | POA: Diagnosis not present

## 2020-08-19 DIAGNOSIS — I10 Essential (primary) hypertension: Secondary | ICD-10-CM | POA: Diagnosis not present

## 2020-08-19 DIAGNOSIS — I69354 Hemiplegia and hemiparesis following cerebral infarction affecting left non-dominant side: Secondary | ICD-10-CM | POA: Diagnosis not present

## 2020-08-19 DIAGNOSIS — Z7901 Long term (current) use of anticoagulants: Secondary | ICD-10-CM | POA: Diagnosis not present

## 2020-08-19 DIAGNOSIS — Z794 Long term (current) use of insulin: Secondary | ICD-10-CM | POA: Diagnosis not present

## 2020-08-19 DIAGNOSIS — C189 Malignant neoplasm of colon, unspecified: Secondary | ICD-10-CM | POA: Diagnosis not present

## 2020-08-19 DIAGNOSIS — S82831D Other fracture of upper and lower end of right fibula, subsequent encounter for closed fracture with routine healing: Secondary | ICD-10-CM | POA: Diagnosis not present

## 2020-08-23 ENCOUNTER — Other Ambulatory Visit: Payer: Self-pay | Admitting: Hematology & Oncology

## 2020-08-23 DIAGNOSIS — C189 Malignant neoplasm of colon, unspecified: Secondary | ICD-10-CM

## 2020-08-23 DIAGNOSIS — Z794 Long term (current) use of insulin: Secondary | ICD-10-CM | POA: Diagnosis not present

## 2020-08-23 DIAGNOSIS — I10 Essential (primary) hypertension: Secondary | ICD-10-CM | POA: Diagnosis not present

## 2020-08-23 DIAGNOSIS — C229 Malignant neoplasm of liver, not specified as primary or secondary: Secondary | ICD-10-CM | POA: Diagnosis not present

## 2020-08-23 DIAGNOSIS — Z7901 Long term (current) use of anticoagulants: Secondary | ICD-10-CM | POA: Diagnosis not present

## 2020-08-23 DIAGNOSIS — S82831D Other fracture of upper and lower end of right fibula, subsequent encounter for closed fracture with routine healing: Secondary | ICD-10-CM | POA: Diagnosis not present

## 2020-08-23 DIAGNOSIS — E119 Type 2 diabetes mellitus without complications: Secondary | ICD-10-CM | POA: Diagnosis not present

## 2020-08-23 DIAGNOSIS — I4891 Unspecified atrial fibrillation: Secondary | ICD-10-CM | POA: Diagnosis not present

## 2020-08-23 DIAGNOSIS — I69354 Hemiplegia and hemiparesis following cerebral infarction affecting left non-dominant side: Secondary | ICD-10-CM | POA: Diagnosis not present

## 2020-08-24 DIAGNOSIS — C189 Malignant neoplasm of colon, unspecified: Secondary | ICD-10-CM | POA: Diagnosis not present

## 2020-08-24 DIAGNOSIS — Z7901 Long term (current) use of anticoagulants: Secondary | ICD-10-CM | POA: Diagnosis not present

## 2020-08-24 DIAGNOSIS — C229 Malignant neoplasm of liver, not specified as primary or secondary: Secondary | ICD-10-CM | POA: Diagnosis not present

## 2020-08-24 DIAGNOSIS — I10 Essential (primary) hypertension: Secondary | ICD-10-CM | POA: Diagnosis not present

## 2020-08-24 DIAGNOSIS — I69354 Hemiplegia and hemiparesis following cerebral infarction affecting left non-dominant side: Secondary | ICD-10-CM | POA: Diagnosis not present

## 2020-08-24 DIAGNOSIS — S82831D Other fracture of upper and lower end of right fibula, subsequent encounter for closed fracture with routine healing: Secondary | ICD-10-CM | POA: Diagnosis not present

## 2020-08-24 DIAGNOSIS — Z794 Long term (current) use of insulin: Secondary | ICD-10-CM | POA: Diagnosis not present

## 2020-08-24 DIAGNOSIS — E119 Type 2 diabetes mellitus without complications: Secondary | ICD-10-CM | POA: Diagnosis not present

## 2020-08-24 DIAGNOSIS — I4891 Unspecified atrial fibrillation: Secondary | ICD-10-CM | POA: Diagnosis not present

## 2020-08-26 DIAGNOSIS — I10 Essential (primary) hypertension: Secondary | ICD-10-CM | POA: Diagnosis not present

## 2020-08-26 DIAGNOSIS — Z794 Long term (current) use of insulin: Secondary | ICD-10-CM | POA: Diagnosis not present

## 2020-08-26 DIAGNOSIS — Z7901 Long term (current) use of anticoagulants: Secondary | ICD-10-CM | POA: Diagnosis not present

## 2020-08-26 DIAGNOSIS — E119 Type 2 diabetes mellitus without complications: Secondary | ICD-10-CM | POA: Diagnosis not present

## 2020-08-26 DIAGNOSIS — S82831D Other fracture of upper and lower end of right fibula, subsequent encounter for closed fracture with routine healing: Secondary | ICD-10-CM | POA: Diagnosis not present

## 2020-08-26 DIAGNOSIS — I69354 Hemiplegia and hemiparesis following cerebral infarction affecting left non-dominant side: Secondary | ICD-10-CM | POA: Diagnosis not present

## 2020-08-26 DIAGNOSIS — C229 Malignant neoplasm of liver, not specified as primary or secondary: Secondary | ICD-10-CM | POA: Diagnosis not present

## 2020-08-26 DIAGNOSIS — C189 Malignant neoplasm of colon, unspecified: Secondary | ICD-10-CM | POA: Diagnosis not present

## 2020-08-26 DIAGNOSIS — I4891 Unspecified atrial fibrillation: Secondary | ICD-10-CM | POA: Diagnosis not present

## 2020-08-30 ENCOUNTER — Other Ambulatory Visit: Payer: Self-pay | Admitting: Hematology & Oncology

## 2020-08-30 ENCOUNTER — Telehealth: Payer: Self-pay

## 2020-08-30 DIAGNOSIS — I10 Essential (primary) hypertension: Secondary | ICD-10-CM | POA: Diagnosis not present

## 2020-08-30 DIAGNOSIS — C189 Malignant neoplasm of colon, unspecified: Secondary | ICD-10-CM | POA: Diagnosis not present

## 2020-08-30 DIAGNOSIS — I4891 Unspecified atrial fibrillation: Secondary | ICD-10-CM | POA: Diagnosis not present

## 2020-08-30 DIAGNOSIS — S82831D Other fracture of upper and lower end of right fibula, subsequent encounter for closed fracture with routine healing: Secondary | ICD-10-CM | POA: Diagnosis not present

## 2020-08-30 DIAGNOSIS — Z794 Long term (current) use of insulin: Secondary | ICD-10-CM | POA: Diagnosis not present

## 2020-08-30 DIAGNOSIS — Z7901 Long term (current) use of anticoagulants: Secondary | ICD-10-CM | POA: Diagnosis not present

## 2020-08-30 DIAGNOSIS — C229 Malignant neoplasm of liver, not specified as primary or secondary: Secondary | ICD-10-CM | POA: Diagnosis not present

## 2020-08-30 DIAGNOSIS — I69354 Hemiplegia and hemiparesis following cerebral infarction affecting left non-dominant side: Secondary | ICD-10-CM | POA: Diagnosis not present

## 2020-08-30 DIAGNOSIS — E119 Type 2 diabetes mellitus without complications: Secondary | ICD-10-CM | POA: Diagnosis not present

## 2020-08-30 NOTE — Telephone Encounter (Signed)
pateints daughter Meredith Mody called and elft message stating she was concerned about her mothers fatigue and no appetitte.  Called gwen back, states her mom just seems to be sleeping a lot more, is not eating and is weak when standing. States she is drinking her glucerna but doesn't know how many a day  Informed Gwen, patient is seeing MD tomorrow and we would let him know of her concerns. She was very appreciative and denies any other questions or concerns.

## 2020-08-30 NOTE — Telephone Encounter (Signed)
S/w pts daughter and she is aware of the later appt time per sch message   Williette Loewe

## 2020-08-31 ENCOUNTER — Ambulatory Visit: Payer: Medicare HMO

## 2020-08-31 ENCOUNTER — Inpatient Hospital Stay: Payer: Medicare HMO

## 2020-08-31 ENCOUNTER — Other Ambulatory Visit: Payer: Medicare HMO

## 2020-08-31 ENCOUNTER — Encounter: Payer: Self-pay | Admitting: Hematology & Oncology

## 2020-08-31 ENCOUNTER — Ambulatory Visit: Payer: Medicare HMO | Admitting: Hematology & Oncology

## 2020-08-31 ENCOUNTER — Inpatient Hospital Stay: Payer: Medicare HMO | Attending: Hematology & Oncology

## 2020-08-31 ENCOUNTER — Inpatient Hospital Stay (HOSPITAL_BASED_OUTPATIENT_CLINIC_OR_DEPARTMENT_OTHER): Payer: Medicare HMO | Admitting: Hematology & Oncology

## 2020-08-31 ENCOUNTER — Telehealth: Payer: Self-pay

## 2020-08-31 ENCOUNTER — Other Ambulatory Visit: Payer: Self-pay

## 2020-08-31 VITALS — BP 142/85 | HR 92 | Temp 98.6°F | Resp 18

## 2020-08-31 VITALS — BP 102/64 | HR 100 | Resp 15

## 2020-08-31 DIAGNOSIS — D500 Iron deficiency anemia secondary to blood loss (chronic): Secondary | ICD-10-CM

## 2020-08-31 DIAGNOSIS — C189 Malignant neoplasm of colon, unspecified: Secondary | ICD-10-CM | POA: Diagnosis not present

## 2020-08-31 DIAGNOSIS — Z5111 Encounter for antineoplastic chemotherapy: Secondary | ICD-10-CM | POA: Insufficient documentation

## 2020-08-31 DIAGNOSIS — Z86711 Personal history of pulmonary embolism: Secondary | ICD-10-CM | POA: Diagnosis not present

## 2020-08-31 DIAGNOSIS — Z79899 Other long term (current) drug therapy: Secondary | ICD-10-CM | POA: Insufficient documentation

## 2020-08-31 DIAGNOSIS — C787 Secondary malignant neoplasm of liver and intrahepatic bile duct: Secondary | ICD-10-CM | POA: Diagnosis not present

## 2020-08-31 DIAGNOSIS — Z923 Personal history of irradiation: Secondary | ICD-10-CM | POA: Diagnosis not present

## 2020-08-31 DIAGNOSIS — Z86718 Personal history of other venous thrombosis and embolism: Secondary | ICD-10-CM | POA: Insufficient documentation

## 2020-08-31 DIAGNOSIS — C182 Malignant neoplasm of ascending colon: Secondary | ICD-10-CM | POA: Diagnosis not present

## 2020-08-31 DIAGNOSIS — D649 Anemia, unspecified: Secondary | ICD-10-CM

## 2020-08-31 DIAGNOSIS — I82402 Acute embolism and thrombosis of unspecified deep veins of left lower extremity: Secondary | ICD-10-CM

## 2020-08-31 DIAGNOSIS — Z7901 Long term (current) use of anticoagulants: Secondary | ICD-10-CM | POA: Diagnosis not present

## 2020-08-31 LAB — CBC WITH DIFFERENTIAL (CANCER CENTER ONLY)
Abs Immature Granulocytes: 0.1 10*3/uL — ABNORMAL HIGH (ref 0.00–0.07)
Basophils Absolute: 0 10*3/uL (ref 0.0–0.1)
Basophils Relative: 0 %
Eosinophils Absolute: 0 10*3/uL (ref 0.0–0.5)
Eosinophils Relative: 0 %
HCT: 29.6 % — ABNORMAL LOW (ref 36.0–46.0)
Hemoglobin: 9.5 g/dL — ABNORMAL LOW (ref 12.0–15.0)
Immature Granulocytes: 1 %
Lymphocytes Relative: 8 %
Lymphs Abs: 0.6 10*3/uL — ABNORMAL LOW (ref 0.7–4.0)
MCH: 29.1 pg (ref 26.0–34.0)
MCHC: 32.1 g/dL (ref 30.0–36.0)
MCV: 90.8 fL (ref 80.0–100.0)
Monocytes Absolute: 0.7 10*3/uL (ref 0.1–1.0)
Monocytes Relative: 9 %
Neutro Abs: 6 10*3/uL (ref 1.7–7.7)
Neutrophils Relative %: 82 %
Platelet Count: 265 10*3/uL (ref 150–400)
RBC: 3.26 MIL/uL — ABNORMAL LOW (ref 3.87–5.11)
RDW: 17.9 % — ABNORMAL HIGH (ref 11.5–15.5)
WBC Count: 7.3 10*3/uL (ref 4.0–10.5)
nRBC: 0 % (ref 0.0–0.2)

## 2020-08-31 LAB — CMP (CANCER CENTER ONLY)
ALT: 28 U/L (ref 0–44)
AST: 99 U/L — ABNORMAL HIGH (ref 15–41)
Albumin: 2.8 g/dL — ABNORMAL LOW (ref 3.5–5.0)
Alkaline Phosphatase: 453 U/L — ABNORMAL HIGH (ref 38–126)
Anion gap: 6 (ref 5–15)
BUN: 16 mg/dL (ref 8–23)
CO2: 29 mmol/L (ref 22–32)
Calcium: 9 mg/dL (ref 8.9–10.3)
Chloride: 105 mmol/L (ref 98–111)
Creatinine: 0.75 mg/dL (ref 0.44–1.00)
GFR, Estimated: >60 mL/min (ref >60)
Glucose, Bld: 142 mg/dL — ABNORMAL HIGH (ref 70–99)
Potassium: 4.4 mmol/L (ref 3.5–5.1)
Sodium: 140 mmol/L (ref 135–145)
Total Bilirubin: 1 mg/dL (ref 0.3–1.2)
Total Protein: 5.4 g/dL — ABNORMAL LOW (ref 6.5–8.1)

## 2020-08-31 LAB — RETICULOCYTES
Immature Retic Fract: 20.8 % — ABNORMAL HIGH (ref 2.3–15.9)
RBC.: 3.22 MIL/uL — ABNORMAL LOW (ref 3.87–5.11)
Retic Count, Absolute: 124.9 10*3/uL (ref 19.0–186.0)
Retic Ct Pct: 3.9 % — ABNORMAL HIGH (ref 0.4–3.1)

## 2020-08-31 LAB — PREALBUMIN: Prealbumin: 6.8 mg/dL — ABNORMAL LOW (ref 18–38)

## 2020-08-31 LAB — SAMPLE TO BLOOD BANK

## 2020-08-31 LAB — SAVE SMEAR(SSMR), FOR PROVIDER SLIDE REVIEW

## 2020-08-31 LAB — LACTATE DEHYDROGENASE: LDH: 1172 U/L — ABNORMAL HIGH (ref 98–192)

## 2020-08-31 MED ORDER — SODIUM CHLORIDE 0.9 % IV SOLN
200.0000 mg | Freq: Once | INTRAVENOUS | Status: AC
  Administered 2020-08-31: 200 mg via INTRAVENOUS
  Filled 2020-08-31: qty 200

## 2020-08-31 MED ORDER — HYDROMORPHONE HCL 4 MG/ML IJ SOLN
3.0000 mg | Freq: Once | INTRAMUSCULAR | Status: AC
  Administered 2020-08-31: 3 mg via INTRAVENOUS

## 2020-08-31 MED ORDER — SODIUM CHLORIDE 0.9 % IV SOLN
INTRAVENOUS | Status: DC
  Filled 2020-08-31: qty 250

## 2020-08-31 MED ORDER — SODIUM CHLORIDE 0.9% FLUSH
10.0000 mL | INTRAVENOUS | Status: DC | PRN
  Administered 2020-08-31: 10 mL
  Filled 2020-08-31: qty 10

## 2020-08-31 MED ORDER — HYDROMORPHONE HCL 4 MG/ML IJ SOLN
INTRAMUSCULAR | Status: AC
  Filled 2020-08-31: qty 1

## 2020-08-31 MED ORDER — HEPARIN SOD (PORK) LOCK FLUSH 100 UNIT/ML IV SOLN
500.0000 [IU] | Freq: Once | INTRAVENOUS | Status: AC | PRN
  Administered 2020-08-31: 500 [IU]
  Filled 2020-08-31: qty 5

## 2020-08-31 MED ORDER — SODIUM CHLORIDE 0.9 % IV SOLN
INTRAVENOUS | Status: DC
  Filled 2020-08-31 (×2): qty 250

## 2020-08-31 NOTE — Patient Instructions (Signed)
Implanted Port Insertion, Care After This sheet gives you information about how to care for yourself after your procedure. Your health care provider may also give you more specific instructions. If you have problems or questions, contact your health care provider. What can I expect after the procedure? After the procedure, it is common to have:  Discomfort at the port insertion site.  Bruising on the skin over the port. This should improve over 3-4 days. Follow these instructions at home: Port care  After your port is placed, you will get a manufacturer's information card. The card has information about your port. Keep this card with you at all times.  Take care of the port as told by your health care provider. Ask your health care provider if you or a family member can get training for taking care of the port at home. A home health care nurse may also take care of the port.  Make sure to remember what type of port you have. Incision care  Follow instructions from your health care provider about how to take care of your port insertion site. Make sure you: ? Wash your hands with soap and water before and after you change your bandage (dressing). If soap and water are not available, use hand sanitizer. ? Change your dressing as told by your health care provider. ? Leave stitches (sutures), skin glue, or adhesive strips in place. These skin closures may need to stay in place for 2 weeks or longer. If adhesive strip edges start to loosen and curl up, you may trim the loose edges. Do not remove adhesive strips completely unless your health care provider tells you to do that.  Check your port insertion site every day for signs of infection. Check for: ? Redness, swelling, or pain. ? Fluid or blood. ? Warmth. ? Pus or a bad smell.      Activity  Return to your normal activities as told by your health care provider. Ask your health care provider what activities are safe for you.  Do not  lift anything that is heavier than 10 lb (4.5 kg), or the limit that you are told, until your health care provider says that it is safe. General instructions  Take over-the-counter and prescription medicines only as told by your health care provider.  Do not take baths, swim, or use a hot tub until your health care provider approves. Ask your health care provider if you may take showers. You may only be allowed to take sponge baths.  Do not drive for 24 hours if you were given a sedative during your procedure.  Wear a medical alert bracelet in case of an emergency. This will tell any health care providers that you have a port.  Keep all follow-up visits as told by your health care provider. This is important. Contact a health care provider if:  You cannot flush your port with saline as directed, or you cannot draw blood from the port.  You have a fever or chills.  You have redness, swelling, or pain around your port insertion site.  You have fluid or blood coming from your port insertion site.  Your port insertion site feels warm to the touch.  You have pus or a bad smell coming from the port insertion site. Get help right away if:  You have chest pain or shortness of breath.  You have bleeding from your port that you cannot control. Summary  Take care of the port as told by your   health care provider. Keep the manufacturer's information card with you at all times.  Change your dressing as told by your health care provider.  Contact a health care provider if you have a fever or chills or if you have redness, swelling, or pain around your port insertion site.  Keep all follow-up visits as told by your health care provider. This information is not intended to replace advice given to you by your health care provider. Make sure you discuss any questions you have with your health care provider. Document Revised: 11/06/2017 Document Reviewed: 11/06/2017 Elsevier Patient Education   2021 Elsevier Inc.  

## 2020-08-31 NOTE — Patient Instructions (Signed)
Dehydration, Adult Dehydration is condition in which there is not enough water or other fluids in the body. This happens when a person loses more fluids than he or she takes in. Important body parts cannot work right without the right amount of fluids. Any loss of fluids from the body can cause dehydration. Dehydration can be mild, worse, or very bad. It should be treated right away to keep it from getting very bad. What are the causes? This condition may be caused by:  Conditions that cause loss of water or other fluids, such as: ? Watery poop (diarrhea). ? Vomiting. ? Sweating a lot. ? Peeing (urinating) a lot.  Not drinking enough fluids, especially when you: ? Are ill. ? Are doing things that take a lot of energy to do.  Other illnesses and conditions, such as fever or infection.  Certain medicines, such as medicines that take extra fluid out of the body (diuretics).  Lack of safe drinking water.  Not being able to get enough water and food. What increases the risk? The following factors may make you more likely to develop this condition:  Having a long-term (chronic) illness that has not been treated the right way, such as: ? Diabetes. ? Heart disease. ? Kidney disease.  Being 65 years of age or older.  Having a disability.  Living in a place that is high above the ground or sea (high in altitude). The thinner, dried air causes more fluid loss.  Doing exercises that put stress on your body for a long time. What are the signs or symptoms? Symptoms of dehydration depend on how bad it is. Mild or worse dehydration  Thirst.  Dry lips or dry mouth.  Feeling dizzy or light-headed, especially when you stand up from sitting.  Muscle cramps.  Your body making: ? Dark pee (urine). Pee may be the color of tea. ? Less pee than normal. ? Less tears than normal.  Headache. Very bad dehydration  Changes in skin. Skin may: ? Be cold to the touch (clammy). ? Be blotchy  or pale. ? Not go back to normal right after you lightly pinch it and let it go.  Little or no tears, pee, or sweat.  Changes in vital signs, such as: ? Fast breathing. ? Low blood pressure. ? Weak pulse. ? Pulse that is more than 100 beats a minute when you are sitting still.  Other changes, such as: ? Feeling very thirsty. ? Eyes that look hollow (sunken). ? Cold hands and feet. ? Being mixed up (confused). ? Being very tired (lethargic) or having trouble waking from sleep. ? Short-term weight loss. ? Loss of consciousness. How is this treated? Treatment for this condition depends on how bad it is. Treatment should start right away. Do not wait until your condition gets very bad. Very bad dehydration is an emergency. You will need to go to a hospital.  Mild or worse dehydration can be treated at home. You may be asked to: ? Drink more fluids. ? Drink an oral rehydration solution (ORS). This drink helps get the right amounts of fluids and salts and minerals in the blood (electrolytes).  Very bad dehydration can be treated: ? With fluids through an IV tube. ? By getting normal levels of salts and minerals in your blood. This is often done by giving salts and minerals through a tube. The tube is passed through your nose and into your stomach. ? By treating the root cause. Follow these instructions at   home: Oral rehydration solution If told by your doctor, drink an ORS:  Make an ORS. Use instructions on the package.  Start by drinking small amounts, about  cup (120 mL) every 5-10 minutes.  Slowly drink more until you have had the amount that your doctor said to have. Eating and drinking  Drink enough clear fluid to keep your pee pale yellow. If you were told to drink an ORS, finish the ORS first. Then, start slowly drinking other clear fluids. Drink fluids such as: ? Water. Do not drink only water. Doing that can make the salt (sodium) level in your body get too low. ? Water  from ice chips you suck on. ? Fruit juice that you have added water to (diluted). ? Low-calorie sports drinks.  Eat foods that have the right amounts of salts and minerals, such as: ? Bananas. ? Oranges. ? Potatoes. ? Tomatoes. ? Spinach.  Do not drink alcohol.  Avoid: ? Drinks that have a lot of sugar. These include:  High-calorie sports drinks.  Fruit juice that you did not add water to.  Soda.  Caffeine. ? Foods that are greasy or have a lot of fat or sugar.         General instructions  Take over-the-counter and prescription medicines only as told by your doctor.  Do not take salt tablets. Doing that can make the salt level in your body get too high.  Return to your normal activities as told by your doctor. Ask your doctor what activities are safe for you.  Keep all follow-up visits as told by your doctor. This is important. Contact a doctor if:  You have pain in your belly (abdomen) and the pain: ? Gets worse. ? Stays in one place.  You have a rash.  You have a stiff neck.  You get angry or annoyed (irritable) more easily than normal.  You are more tired or have a harder time waking than normal.  You feel: ? Weak or dizzy. ? Very thirsty. Get help right away if you have:  Any symptoms of very bad dehydration.  Symptoms of vomiting, such as: ? You cannot eat or drink without vomiting. ? Your vomiting gets worse or does not go away. ? Your vomit has blood or green stuff in it.  Symptoms that get worse with treatment.  A fever.  A very bad headache.  Problems with peeing or pooping (having a bowel movement), such as: ? Watery poop that gets worse or does not go away. ? Blood in your poop (stool). This may cause poop to look black and tarry. ? Not peeing in 6-8 hours. ? Peeing only a small amount of very dark pee in 6-8 hours.  Trouble breathing. These symptoms may be an emergency. Do not wait to see if the symptoms will go away. Get  medical help right away. Call your local emergency services (911 in the [place] Do not drive yourself to the hospital. Summary  Dehydration is a condition in which there is not enough water or other fluids in the body. This happens when a person loses more fluids than he or she takes in.  Treatment for this condition depends on how bad it is. Treatment should be started right away. Do not wait until your condition gets very bad.  Drink enough clear fluid to keep your pee pale yellow. If you were told to drink an oral rehydration solution (ORS), finish the ORS first. Then, start slowly drinking other clear fluids.    Take over-the-counter and prescription medicines only as told by your doctor.  Get help right away if you have any symptoms of very bad dehydration. This information is not intended to replace advice given to you by your health care provider. Make sure you discuss any questions you have with your health care provider. Document Revised: 11/21/2018 Document Reviewed: 11/21/2018 Elsevier Patient Education  2021 [employer]  

## 2020-08-31 NOTE — Progress Notes (Signed)
1250: MD order for dilaudid 3mg  with patients fluids today, prior to giving confirmed with patient and chart she has received this before. Patient denies any current pain at this time but daughter verbalized it is for when she gets home to prevent any pain as that is when her pain starts. Ok per MD. 6962: pain medications given via IV 1256: patient states she is going to faint, BP checked and patient placed in trendelenburg, BP 161/82 HR 90 o2 90% R 8 Patient not following verbal commands  Laverna Peace, NP at bedside to evaluate patient 2/l o2 placed on patient 1258: o2 98% BP 159/90 HR 90, patient still not speaking but following commands. 1305: patient stated she felt nauseated, daughter states all she had to eat today was a slice of bacon and glucerna at 830 this am. Patient agreed to drinking some ginger ale. No vomiting. 1307: Patient went to sleep, respirations 15, 02 100% with 2/l o2. Daughter in room with her and will check in on her periodically.  1500: Patient ready to go home, verbalized she was feeling okay just a little nauseated, declined any antiemetics while she was here, she does have some at home if needed, BP 102/64, o2 with no oxygen on 98% port flushed de-accessed and patient placed in wheelchair and taken downstairs to transportation services, daughter states she will call her brother to come help get her in bed when they get home and she will call if they have any problems.

## 2020-08-31 NOTE — Telephone Encounter (Signed)
S/w debbie and she is aware of the appts per the 08/31/20 los  Tonya [name]

## 2020-08-31 NOTE — Progress Notes (Signed)
Hematology and Oncology Follow Up Visit  Tonya [name] [id number] [birth year] 80 y.o. 08/31/2020   Principle Diagnosis:  Stage IV adenocarcinoma of the ascending colon -- NRAS+, BRAF-, HER2-, MSI/MMR -,  Iron deficiency anemia DVT in LEFT leg-- Peroneal and popliteal vein  Past Therapy: FOLFOX - s/p cycle5 Xeloda with radiation therapy.  To started on 04/08/2020.  Patient will take 1500 mg of Xeloda daily. FOLFIRI/Bevacizumab -started 01/20/2019,s/p cycle #16 -- Avastin d/c'ed on 10/21/2019 due to DVT  Current Therapy: Lonsurf 60 mg po q day (5 on - 2 off - 5 on - 2 weeks off) - started 08/02/2020 -- d/c due to poor tolerance IV Iron as indicated Eliquis 5 mg po BID -- started on 09/30/2019 --DC on 04/01/2020 secondary to GI bleeding -- re-started on 05/03/2020   Interim History:  Tonya [name] is here today with her daughter for follow-up.  Unfortunately, I just do not think that she is going to tolerate the Lonsurf.  She is not eating.  She has very poor nutritional intake.  She is quite weak.  She does not feel like doing much.  I totally understand all of this.  She is still not willing to go to comfort care.  She is very determined to continue anticancer treatment.  I think is can be hard to try to treat her with anything aggressive.  Her performance status just is not all that great.  Her performance status, at best, is probably ECOG 2.  Her last CEA level back in April was 90.  She is having more pain.  I will go ahead and give her some Dilaudid in the office.  Hopefully this can help with some of the discomfort.  She ran out of Ultram a couple days ago.  I sent some Ultram back into her.  She still has a lot of swelling in the legs.  Again I suspect this is all from her underlying malignancy in the liver.  I really am not sure that diuretics will help all that much.  She is trying her best.  I realize this.  I just feel bad that she is not doing as well right  now.  Currently, I would say her performance status is ECOG 2-3. This man Medications:  Allergies as of 08/31/2020      Reactions   Hydrocodone Swelling, Other (See Comments)   "I started swelling, became red, and passed out"   Iohexol Hives, Shortness Of Breath, Other (See Comments)   Patient developed hives and fullness in throat post injection of 125cc's Omni 300, Onset Date: 11/15/2006   Naproxen Other (See Comments)   "It made me feel out of my head"   Ibuprofen Nausea Only   Propoxyphene N-acetaminophen Other (See Comments)   "I couldn't find the door to make my way out of the room- I was in misery"      Medication List       Accurate as of Aug 31, 2020 11:46 AM. If you have any questions, ask your nurse or doctor.        atorvastatin 10 MG tablet Commonly known as: LIPITOR Take 1 tablet (10 mg total) by mouth daily.   B-D SINGLE USE SWABS REGULAR Pads 1 each by Does not apply route as needed (to cleanse site prior to obtaining droplet of blood for CBG's). E11.9   B-D ULTRAFINE III SHORT PEN 31G X 8 MM Misc Generic drug: Insulin Pen Needle   Eliquis 5 MG Tabs tablet Generic  drug: apixaban TAKE 1 TABLET(5 MG) BY MOUTH TWICE DAILY   famotidine 40 MG tablet Commonly known as: PEPCID TAKE 1 TABLET TWICE DAILY   furosemide 40 MG tablet Commonly known as: LASIX Take 1 tablet (40 mg total) by mouth daily.   ketoconazole 2 % cream Commonly known as: NIZORAL Apply 1 application topically 2 (two) times daily.   lidocaine-prilocaine cream Commonly known as: EMLA Apply 1 application topically as needed for up to 30 doses.   Lonsurf 20-8.19 MG tablet Generic drug: trifluridine-tipiracil TAKE 3 TABLETS (60 MG OF TRIFLURIDINE TOTAL) BY MOUTH DAILY. 1 HR AFTER AM & PM MEALS ON DAYS 1-5, 8-12. REPEAT EVERY 28DAY   magic mouthwash Soln Swish and swallow equal parts Benadryl, Lidocaine and Nystatin.  5- 10 ml.'s four times a day as needed.   meclizine 12.5 MG  tablet Commonly known as: ANTIVERT TAKE 1 TABLET(12.5 MG) BY MOUTH THREE TIMES DAILY AS NEEDED FOR DIZZINESS   methylphenidate 10 MG tablet Commonly known as: Ritalin Take 1 tablet (10 mg total) by mouth 2 (two) times daily.   metolazone 5 MG tablet Commonly known as: ZAROXOLYN Take 1 tablet (5 mg total) by mouth daily as needed (Excess fluid in leg). Take 60 minutes BEFORE Lasix   metoprolol succinate 25 MG 24 hr tablet Commonly known as: TOPROL-XL 25 mg daily.   potassium chloride SA 20 MEQ tablet Commonly known as: KLOR-CON Take 1 tablet (20 mEq total) by mouth daily.   predniSONE 20 MG tablet Commonly known as: DELTASONE TAKE 1 TABLET (20 MG TOTAL) BY MOUTH DAILY WITH BREAKFAST.   prochlorperazine 10 MG tablet Commonly known as: COMPAZINE TAKE 1 TABLET(10 MG) BY MOUTH EVERY 6 HOURS AS NEEDED FOR NAUSEA OR VOMITING   terbinafine 1 % cream Commonly known as: LAMISIL Apply topically 2 (two) times daily. PRN   traMADol 50 MG tablet Commonly known as: ULTRAM TAKE 1 TABLET(50 MG) BY MOUTH EVERY 6 HOURS AS NEEDED   Tresiba FlexTouch 100 UNIT/ML FlexTouch Pen Generic drug: insulin degludec Inject 0.21 mLs (21 Units total) into the skin daily. Take 34 units on chemo days   True Metrix Blood Glucose Test test strip Generic drug: glucose blood TEST BLOOD SUGAR TWICE DAILY   TRUEplus Lancets 30G Misc 1 each by Does not apply route 2 (two) times daily. E11.9   vitamin B-12 500 MCG tablet Commonly known as: CYANOCOBALAMIN Take 500 mcg by mouth daily.       Allergies:  Allergies  Allergen Reactions  . Hydrocodone Swelling and Other (See Comments)    "I started swelling, became red, and passed out"  . Iohexol Hives, Shortness Of Breath and Other (See Comments)    Patient developed hives and fullness in throat post injection of 125cc's Omni 300, Onset Date: 11/15/2006   . Naproxen Other (See Comments)    "It made me feel out of my head"  . Ibuprofen Nausea Only  .  Propoxyphene N-Acetaminophen Other (See Comments)    "I couldn't find the door to make my way out of the room- I was in misery"    Past Medical History, Surgical history, Social history, and Family History were reviewed and updated.  Review of Systems: Review of Systems  Constitutional: Positive for fever, malaise/fatigue and weight loss.  HENT: Negative.   Eyes: Negative.   Respiratory: Positive for shortness of breath.   Cardiovascular: Positive for leg swelling.  Gastrointestinal: Positive for abdominal pain and nausea.  Genitourinary: Negative.   Musculoskeletal: Positive for  joint pain.  Skin: Negative.   Neurological: Negative.   Endo/Heme/Allergies: Negative.   Psychiatric/Behavioral: Negative.      Physical Exam:  oral temperature is 98.6 F (37 C). Her blood pressure is 142/85 (abnormal) and her pulse is 92. Her respiration is 18 and oxygen saturation is 100%.   Wt Readings from Last 3 Encounters:  07/12/20 161 lb (73 kg)  02/16/20 182 lb (82.6 kg)  01/29/20 187 lb 12.8 oz (85.2 kg)    Physical Exam Vitals reviewed.  HENT:     Head: Normocephalic and atraumatic.  Eyes:     Pupils: Pupils are equal, round, and reactive to light.  Cardiovascular:     Rate and Rhythm: Normal rate and regular rhythm.     Heart sounds: Normal heart sounds.  Pulmonary:     Effort: Pulmonary effort is normal.     Breath sounds: Normal breath sounds.  Abdominal:     General: Bowel sounds are normal.     Palpations: Abdomen is soft.  Musculoskeletal:        General: No tenderness or deformity. Normal range of motion.     Cervical back: Normal range of motion.     Comments: Extremities shows a lot of edema in the legs.  There is probably some more edema in the right leg than the left leg.  She has decreased range of motion of her lower extremities.  Lymphadenopathy:     Cervical: No cervical adenopathy.  Skin:    General: Skin is warm and dry.     Findings: No erythema or rash.   Neurological:     Mental Status: She is alert and oriented to person, place, and time.  Psychiatric:        Behavior: Behavior normal.        Thought Content: Thought content normal.        Judgment: Judgment normal.      Lab Results  Component Value Date   WBC 7.3 08/31/2020   HGB 9.5 (L) 08/31/2020   HCT 29.6 (L) 08/31/2020   MCV 90.8 08/31/2020   PLT 265 08/31/2020   Lab Results  Component Value Date   FERRITIN 1,904 (H) 08/10/2020   IRON 33 (L) 08/10/2020   TIBC 168 (L) 08/10/2020   UIBC 135 08/10/2020   IRONPCTSAT 19 (L) 08/10/2020   Lab Results  Component Value Date   RETICCTPCT 3.9 (H) 08/31/2020   RBC 3.22 (L) 08/31/2020   RETICCTABS 62,400 05/17/2016   No results found for: KPAFRELGTCHN, LAMBDASER, KAPLAMBRATIO No results found for: IGGSERUM, IGA, IGMSERUM No results found for: Ronnald Ramp, A1GS, A2GS, Tillman Sers, SPEI   Chemistry      Component Value Date/Time   NA 139 08/10/2020 1106   NA 139 09/30/2018 1311   K 4.0 08/10/2020 1106   CL 104 08/10/2020 1106   CO2 28 08/10/2020 1106   BUN 13 08/10/2020 1106   BUN 10 09/30/2018 1311   CREATININE 0.66 08/10/2020 1106      Component Value Date/Time   CALCIUM 9.6 08/10/2020 1106   ALKPHOS 731 (H) 08/10/2020 1106   AST 89 (H) 08/10/2020 1106   ALT 42 08/10/2020 1106   BILITOT 1.1 08/10/2020 1106       Impression and Plan: Tonya [name] is a very pleasant 80 yo African American female with metastatic colon cancer.  Again, I think it is clear that she just is not tolerating the Lonsurf.  Her quality of life  is not  that great.   Again, she just does not want to give up as of yet.  Our options are incredibly limited.  I suppose we could try the 5-FU/liposomal irinotecan combination.  I suppose this might be a reasonable treatment.  I talked her about this.  She understands that there are toxicities.  She has watch out for diarrhea.  She has watch out for blood  counts.  Again, I think that the CEA level will tell us how well things are working.  If she does not tolerate this protocol or if it does not work, then we will make sure that she goes to comfort care and in all likelihood we will have to get Hospice involved.  I realize that she and her family are very determined.  We will try this protocol.  I think it is reasonable to do.  We will give her a little bit of a dosage reduction.  We will try to start this in a couple weeks.  [name], MD 5/10/202211:46 AM

## 2020-09-01 ENCOUNTER — Telehealth: Payer: Self-pay | Admitting: *Deleted

## 2020-09-01 ENCOUNTER — Other Ambulatory Visit: Payer: Self-pay | Admitting: *Deleted

## 2020-09-01 LAB — IRON AND TIBC
Iron: 33 ug/dL — ABNORMAL LOW (ref 41–142)
Saturation Ratios: 22 % (ref 21–57)
TIBC: 151 ug/dL — ABNORMAL LOW (ref 236–444)
UIBC: 118 ug/dL — ABNORMAL LOW (ref 120–384)

## 2020-09-01 LAB — FERRITIN: Ferritin: 1801 ng/mL — ABNORMAL HIGH (ref 11–307)

## 2020-09-01 LAB — CEA (IN HOUSE-CHCC): CEA (CHCC-In House): 124.78 ng/mL — ABNORMAL HIGH (ref 0.00–5.00)

## 2020-09-01 MED ORDER — LIDOCAINE-PRILOCAINE 2.5-2.5 % EX CREA: 1.0000 "application " | TOPICAL_CREAM | CUTANEOUS | 1 refills | Status: AC | PRN

## 2020-09-01 NOTE — Telephone Encounter (Signed)
Message received from patient's daughter, Tonya [name] to inform Dr. Marin Olp that pt.'s pain is better today and would like a call back regarding pt.'s next steps.  Call placed back to [facility] and Dr. Antonieta Pert office note from seeing pt on 08/31/20 reviewed with [name].  Tonya [name] states that she is totally exhausted and would be interested in getting some assistance to help with the care of her mother. Palliative Care services reviewed with Tonya [name] and Tonya [name] is agreeable to assistance.  Order placed to [facility] Palliative care per order of Dr. Marin Olp.

## 2020-09-02 ENCOUNTER — Telehealth: Payer: Self-pay

## 2020-09-02 DIAGNOSIS — S82831D Other fracture of upper and lower end of right fibula, subsequent encounter for closed fracture with routine healing: Secondary | ICD-10-CM | POA: Diagnosis not present

## 2020-09-02 DIAGNOSIS — E119 Type 2 diabetes mellitus without complications: Secondary | ICD-10-CM | POA: Diagnosis not present

## 2020-09-02 DIAGNOSIS — I69354 Hemiplegia and hemiparesis following cerebral infarction affecting left non-dominant side: Secondary | ICD-10-CM | POA: Diagnosis not present

## 2020-09-02 DIAGNOSIS — I10 Essential (primary) hypertension: Secondary | ICD-10-CM | POA: Diagnosis not present

## 2020-09-02 DIAGNOSIS — E1169 Type 2 diabetes mellitus with other specified complication: Secondary | ICD-10-CM | POA: Diagnosis not present

## 2020-09-02 DIAGNOSIS — C189 Malignant neoplasm of colon, unspecified: Secondary | ICD-10-CM | POA: Diagnosis not present

## 2020-09-02 DIAGNOSIS — D500 Iron deficiency anemia secondary to blood loss (chronic): Secondary | ICD-10-CM | POA: Diagnosis not present

## 2020-09-02 DIAGNOSIS — Z7901 Long term (current) use of anticoagulants: Secondary | ICD-10-CM | POA: Diagnosis not present

## 2020-09-02 DIAGNOSIS — D630 Anemia in neoplastic disease: Secondary | ICD-10-CM | POA: Diagnosis not present

## 2020-09-02 DIAGNOSIS — I4891 Unspecified atrial fibrillation: Secondary | ICD-10-CM | POA: Diagnosis not present

## 2020-09-02 DIAGNOSIS — Z794 Long term (current) use of insulin: Secondary | ICD-10-CM | POA: Diagnosis not present

## 2020-09-02 DIAGNOSIS — E785 Hyperlipidemia, unspecified: Secondary | ICD-10-CM | POA: Diagnosis not present

## 2020-09-02 DIAGNOSIS — C229 Malignant neoplasm of liver, not specified as primary or secondary: Secondary | ICD-10-CM | POA: Diagnosis not present

## 2020-09-02 NOTE — Telephone Encounter (Signed)
Spoke with patient's daughter [name] and scheduled an in-person Palliative Consult for 09/28/20 @ 12:30PM.  COVID screening was negative. No pets in home. Patient lives alone, but daughter stays with her during the day.  Consent obtained; updated Outlook/Netsmart/Team List and Epic.   Family is aware they may be receiving a call from NP the day before or day of to confirm appointment.

## 2020-09-03 ENCOUNTER — Telehealth: Payer: Self-pay | Admitting: *Deleted

## 2020-09-03 NOTE — Telephone Encounter (Signed)
Tonya [name] called, patients daughter.  States that she saw a little bit of blood in mothers stool this morning.  Mother is feeling fine.  Inquired about a blood transfusion.  Spoke with Dr Marin Olp.  Stated we can bring patient in for labs/T and C and see if she needs blood transfusion.  Daughter discussed with Mother who decided that they will wait till 09/13/2020 appt to check.  Reminded daughter to not hesitate  To call us if something comes up in the meantime and we can check labs sooner

## 2020-09-03 NOTE — Telephone Encounter (Signed)
Dr Richardean Sale from [facility] called asking about the use of Onivyde for Colon cancer as it cannot be substituted for solvent Irinotecan.  Dr Marin Olp stated he is substituting because patient cannot tolerate toxicities.  Dr Richardean Sale will forward referral on to inquire about approval but not usually indicated in this diagnosis.

## 2020-09-08 ENCOUNTER — Other Ambulatory Visit: Payer: Self-pay

## 2020-09-08 ENCOUNTER — Other Ambulatory Visit: Payer: Self-pay | Admitting: Hematology & Oncology

## 2020-09-08 ENCOUNTER — Ambulatory Visit (INDEPENDENT_AMBULATORY_CARE_PROVIDER_SITE_OTHER): Payer: Medicare HMO | Admitting: Podiatry

## 2020-09-08 DIAGNOSIS — R600 Localized edema: Secondary | ICD-10-CM | POA: Diagnosis not present

## 2020-09-08 MED ORDER — DOXYCYCLINE HYCLATE 100 MG PO TABS: 100.0000 mg | ORAL_TABLET | Freq: Two times a day (BID) | ORAL | 0 refills | Status: DC

## 2020-09-08 NOTE — Progress Notes (Signed)
Primary Physician/Referring:  Leeroy Cha, MD  Patient ID: Peggye Fothergill, female    DOB: 09/24/1940, 80 y.o.   MRN: [id number]  Chief Complaint  Patient presents with  . Shortness of Breath  . Hypertension    HPI: DAMYA HAWKES  is a 80 y.o. female with Hypertension, hyperlipidemia, uncontrolled diabetes mellitus, history of stroke involving the right pons in 2001 with moderate diffuse atherosclerotic changes in the intracerebral vessels when she presented with left-sided weakness and slurred speech, lumbosacral spondylosi, nonsmoker, h/o GI bleed on 10/18/2018 and anemia due to descending colonic cancer with liver metastasis and presently on chemotherapy and h/o DVT in July 2021 and now on Eliquis.  Patient was last seen by Dr. Einar Gip 07/12/2020 at which time he advised her to switch metolazone to as needed and continue daily Lasix. Review of Dr. Irven Shelling note form last visit indicates they had a lengthy discussion regarding poor prognosis and end of life care.  Patient was advised to follow-up on an as-needed basis.  In the meantime patient has agreed to palliative care consult which is scheduled for June 14.  She now presents to our office with concerns of blood pressure management and shortness of breath.  Patient reports worsening shortness of breath and orthopnea for the last several weeks.  She is currently taking Lasix 40 mg once daily.  Patient is accompanied by her daughter at today's visit.  They expressed concern regarding episode recently at oncologist office where they gave patient Dilaudid and patient had an episode of difficulty speaking and confusion.  Denies chest pain, palpitations, syncope, near syncope.  Continues to have bilateral lower leg edema.  Past Medical History:  Diagnosis Date  . Cerebrovascular accident [facility]) 2001   left side weakness  . Chronic edema    ? venous insufficiency  . Colon cancer ([place]) 2020  . COPD (chronic obstructive pulmonary  disease) ([place])    never had a problem breathing  . Diabetes mellitus without complication ([place])   . Dyspnea   . Enlarged heart    per pt  . Gallstones   . GERD (gastroesophageal reflux disease)   . Goals of care, counseling/discussion 11/01/2018  . History of radiation therapy 04/08/2020-05/21/2020   Colon;Dr. Gery Pray  . Hyperlipidemia   . Hypertension   . PONV (postoperative nausea and vomiting)    nausea     Past Surgical History:  Procedure Laterality Date  . ABDOMINAL HYSTERECTOMY    . BIOPSY  10/18/2018   Procedure: BIOPSY;  Surgeon: Jackquline Denmark, MD;  Location: [facility] ENDOSCOPY;  Service: Endoscopy;;  . CHOLECYSTECTOMY    . COLONOSCOPY WITH PROPOFOL N/A 10/18/2018   Procedure: COLONOSCOPY WITH PROPOFOL;  Surgeon: Jackquline Denmark, MD;  Location: [facility] ENDOSCOPY;  Service: Endoscopy;  Laterality: N/A;  . IR IMAGING GUIDED PORT INSERTION  11/11/2018  . IR IVC FILTER PLMT / S&I /IMG GUID/MOD SED  02/11/2020  . POLYPECTOMY  10/18/2018   Procedure: POLYPECTOMY;  Surgeon: Jackquline Denmark, MD;  Location: [facility] ENDOSCOPY;  Service: Endoscopy;;  . SUBMUCOSAL TATTOO INJECTION  10/18/2018   Procedure: SUBMUCOSAL TATTOO INJECTION;  Surgeon: Jackquline Denmark, MD;  Location: [facility] ENDOSCOPY;  Service: Endoscopy;;   Family History  Problem Relation Age of Onset  . Cirrhosis Mother   . Heart disease Father   . Cancer Other   . Hypertension Other   . Diabetes Other    Social History   Tobacco Use  . Smoking status: Former Smoker    Packs/day: 0.25  Types: Cigarettes    Quit date: 07/23/1968    Years since quitting: 52.1  . Smokeless tobacco: Never Used  . Tobacco comment: 6 MONTH  Substance Use Topics  . Alcohol use: No    Alcohol/week: 0.0 standard drinks   Marital Status: Widowed   ROS   Review of Systems  Cardiovascular: Positive for dyspnea on exertion, leg swelling and orthopnea. Negative for chest pain and palpitations.  Musculoskeletal: Positive for arthritis, back pain and joint  pain.  Gastrointestinal: Negative for melena.  Neurological: Positive for focal weakness (left hemiparesis).   Objective  Blood pressure 126/78, pulse 92, temperature 98 F (36.7 C), resp. rate 16, height 5\' 11"  (1.803 m), SpO2 96 %. Body mass index is 22.45 kg/m.  Vitals with BMI 09/09/2020 08/31/2020 08/31/2020  Height 5\' 11"  - -  Weight - - (No Data)  BMI - - -  Systolic [id number] [id number]  Diastolic 78 64 85  Pulse 92 100 92    Physical Exam Constitutional:      Comments: Moderately built  Neck:     Thyroid: No thyromegaly.     Vascular: No JVD.  Cardiovascular:     Rate and Rhythm: Normal rate and regular rhythm. Occasional extrasystoles are present.    Pulses: Intact distal pulses.     Heart sounds: Murmur heard.   Blowing midsystolic murmur is present with a grade of 3/6 at the apex. No gallop.   Pulmonary:     Effort: Pulmonary effort is normal.     Breath sounds: Normal breath sounds.  Abdominal:     General: Bowel sounds are normal. There is distension.     Palpations: Abdomen is soft.     Tenderness: There is abdominal tenderness.     Comments: Ascites noted.   Musculoskeletal:     Right lower leg: Edema (1+pitting) present.     Left lower leg: Edema (2+ pitting) present.  Neurological:     Comments: Grossly left arm weakness and mild contracture noted, left lower extremity weakness present. (left hemiparesis).    Laboratory examination:    CMP Latest Ref Rng & Units 08/31/2020 08/10/2020 07/14/2020  Glucose 70 - 99 mg/dL 142(H) 116(H) 150(H)  BUN 8 - 23 mg/dL 16 13 15   Creatinine 0.44 - 1.00 mg/dL 0.75 0.66 0.68  Sodium 135 - 145 mmol/L 140 139 140  Potassium 3.5 - 5.1 mmol/L 4.4 4.0 3.4(L)  Chloride 98 - 111 mmol/L 105 104 102  CO2 22 - 32 mmol/L 29 28 32  Calcium 8.9 - 10.3 mg/dL 9.0 9.6 9.2  Total Protein 6.5 - 8.1 g/dL 5.4(L) 6.1(L) 6.0(L)  Total Bilirubin 0.3 - 1.2 mg/dL 1.0 1.1 0.7  Alkaline Phos 38 - 126 U/L 453(H) 731(H) 552(H)  AST 15 - 41 U/L 99(H)  89(H) 116(H)  ALT 0 - 44 U/L 28 42 42   CBC Latest Ref Rng & Units 08/31/2020 08/10/2020 07/14/2020  WBC 4.0 - 10.5 K/uL 7.3 8.3 7.1  Hemoglobin 12.0 - 15.0 g/dL 9.5(L) 10.1(L) 9.3(L)  Hematocrit 36.0 - 46.0 % 29.6(L) 31.5(L) 29.5(L)  Platelets 150 - 400 K/uL 265 248 241   Lipid Panel     Component Value Date/Time   CHOL 137 03/30/2018 1201   TRIG 80 03/30/2018 1201   HDL 49 03/30/2018 1201   CHOLHDL 2.8 03/30/2018 1201   CHOLHDL 3 07/17/2017 1227   VLDL 21.6 07/17/2017 1227   LDLCALC 72 03/30/2018 1201   HEMOGLOBIN A1C Lab Results  Component Value Date  HGBA1C 7.6 (A) 09/19/2019   TSH No results for input(s): TSH in the last 8760 hours.  Allergies and Medications   Allergies  Allergen Reactions  . Hydrocodone Swelling and Other (See Comments)    "I started swelling, became red, and passed out"  . Iohexol Hives, Shortness Of Breath and Other (See Comments)    Patient developed hives and fullness in throat post injection of 125cc's Omni 300, Onset Date: 11/15/2006   . Naproxen Other (See Comments)    "It made me feel out of my head"  . Ibuprofen Nausea Only  . Propoxyphene N-Acetaminophen Other (See Comments)    "I couldn't find the door to make my way out of the room- I was in misery"    Current Outpatient Medications on File Prior to Visit  Medication Sig Dispense Refill  . Alcohol Swabs (B-D SINGLE USE SWABS REGULAR) PADS 1 each by Does not apply route as needed (to cleanse site prior to obtaining droplet of blood for CBG's). E11.9 100 each 2  . B-D ULTRAFINE III SHORT PEN 31G X 8 MM MISC     . doxycycline (VIBRA-TABS) 100 MG tablet Take 1 tablet (100 mg total) by mouth 2 (two) times daily. 20 tablet 0  . ELIQUIS 5 MG TABS tablet TAKE 1 TABLET(5 MG) BY MOUTH TWICE DAILY 60 tablet 1  . famotidine (PEPCID) 40 MG tablet TAKE 1 TABLET TWICE DAILY 180 tablet 3  . insulin degludec (TRESIBA FLEXTOUCH) 100 UNIT/ML FlexTouch Pen Inject 0.21 mLs (21 Units total) into the skin  daily. Take 34 units on chemo days 10 pen 11  . ketoconazole (NIZORAL) 2 % cream Apply 1 application topically 2 (two) times daily. 60 g 2  . lidocaine-prilocaine (EMLA) cream Apply 1 application topically as needed for up to 30 doses. 30 g 1  . magic mouthwash SOLN Swish and swallow equal parts Benadryl, Lidocaine and Nystatin.  5- 10 ml.'s four times a day as needed. (Patient taking differently: Swish and swallow equal parts Benadryl, Lidocaine and Nystatin.  5- 10 ml.'s four times a day as needed.) 240 mL 0  . meclizine (ANTIVERT) 12.5 MG tablet TAKE 1 TABLET(12.5 MG) BY MOUTH THREE TIMES DAILY AS NEEDED FOR DIZZINESS 30 tablet 2  . methylphenidate (RITALIN) 10 MG tablet Take 1 tablet (10 mg total) by mouth 2 (two) times daily. 60 tablet 0  . metoprolol succinate (TOPROL-XL) 25 MG 24 hr tablet 25 mg daily.    . prochlorperazine (COMPAZINE) 10 MG tablet TAKE 1 TABLET(10 MG) BY MOUTH EVERY 6 HOURS AS NEEDED FOR NAUSEA OR VOMITING 30 tablet 1  . terbinafine (LAMISIL) 1 % cream Apply topically 2 (two) times daily. PRN    . traMADol (ULTRAM) 50 MG tablet TAKE 1 TABLET(50 MG) BY MOUTH EVERY 6 HOURS AS NEEDED 90 tablet 0  . TRUE METRIX BLOOD GLUCOSE TEST test strip TEST BLOOD SUGAR TWICE DAILY 200 strip 1  . TRUEplus Lancets 30G MISC 1 each by Does not apply route 2 (two) times daily. E11.9 180 each 0  . vitamin B-12 (CYANOCOBALAMIN) 500 MCG tablet Take 500 mcg by mouth daily.     No current facility-administered medications on file prior to visit.     Radiology    No results found.  Cardiac Studies:   Holter Monitor for 24 hours  10/09/2016:  Normal sinus rhythm, sinus bradycardia and sinus tachycardia with heart rate ranging from 45-121bpm with average heart rate 69bpm.  Frequent PVCs, bigeminal PVCs, salvos and nonsustained  ventricular tachycardia up to 5 beats in a ros  PVC load was 20%.  Occasional PACs and nonsustained atrial tachycardia up to 7 beats in a row.  Event monitor 30  days 07/30/2018: Predominant rhythm is normal sinus rhythm.  There were 3 runs of NSVT, maximum 13 beats, minimum 3 beats, asymptomatic.  Frequent PVCs, ventricular ectopic burden 5%.  There are occasional PACs.  Echocardiogram 08/28/2018: Left ventricle cavity is normal in size. Moderate concentric hypertrophy of the left ventricle. Normal global wall motion. Doppler evidence of grade II (pseudonormal) diastolic dysfunction, elevated LAP. Calculated EF 53%. Mild (Grade I) mitral regurgitation. Mild tricuspid regurgitation. Estimated pulmonary artery systolic pressure 26 mmHg. Mild pulmonic regurgitation. Compared to 10/09/16, previously mild LVH.   Lexiscan Myoview stress test 10/08/2018: Lexiscan stress test was performed. Stress EKG is non-diagnostic, as this is pharmacological stress test. Myocardial pefusion imaging is normal. LVEF 65%. Low risk study.  No significant change from  10/09/2016.  Lower Extremity Venous Duplex  10/01/2019: Nonocclusive deep venous thrombus identified within the LEFT popliteal and LEFT peroneal veins. No evidence of deep venous thrombosis in the RIGHT lower extremity.   Lower Extremity Venous Duplex left leg  11/12/2019: Redemonstration of nonocclusive left popliteal vein DVT. No evidence of extension, and no evidence of proximal DVT.  Tibial veins appear patent with no DVT.  Lower Extremity Venous Duplex  04/30/2020: Sonographic survey of the bilateral lower extremities positive for new acute DVT of the bilateral common femoral, femoral, popliteal, and tibial vessels. Thrombus likely extends cephalad into at least the iliac veins.  EKG:   EKG 07/12/2020: Normal sinus rhythm at rate of 77 bpm, inferior infarct old.  Anteroseptal infarct old.  No evidence of ischemia.  Voltage criteria for LVH.    EKG 05/31/2018: Sinus rhythm with first-degree AV block at rate of 56 bpm, left axis deviation, left anterior fascicular block.  Cannot exclude inferior  infarct old, anteroseptal infarct old.  No evidence of ischemia.     Assessment     ICD-10-CM   1. Essential hypertension  I10   2. Bilateral leg edema  R60.0   3. Dyspnea on exertion  R06.00 CANCELED: Brain natriuretic peptide  4. H/O ischemic right PCA stroke - right pons 2001: Residual left hemiparesis  Z86.73   5. Orthopnea  R06.01       Meds ordered this encounter  Medications  . DISCONTD: torsemide (DEMADEX) 20 MG tablet    Sig: Take 1 tablet (20 mg total) by mouth daily.    Dispense:  30 tablet    Refill:  3  . spironolactone (ALDACTONE) 25 MG tablet    Sig: Take 0.5 tablets (12.5 mg total) by mouth daily.    Dispense:  15 tablet    Refill:  3  . torsemide (DEMADEX) 20 MG tablet    Sig: Take 1 tablet (20 mg total) by mouth daily. With addition doses as needed.    Dispense:  30 tablet    Refill:  3   Medications Discontinued During This Encounter  Medication Reason  . furosemide (LASIX) 40 MG tablet Change in therapy  . torsemide (DEMADEX) 20 MG tablet       Recommendations:   QUADASIA NEWSHAM  is a 80 y.o. female  with Hypertension, hyperlipidemia, uncontrolled diabetes mellitus, history of stroke involving the right pons in 2001 with moderate diffuse atherosclerotic changes in the intracerebral vessels when she presented with left-sided weakness and slurred speech, lumbosacral spondylosi, nonsmoker, h/o GI bleed on  10/18/2018 and anemia due to descending colonic cancer with liver metastasis and presently on chemotherapy and h/o DVT in July 2021 and now on Eliquis presents for follow-up of hypertension, dyspnea on exertion and abnormal EKG with frequent PVCs. Eliquis 5 mg po BID -- started on 09/30/2019 --DC on 04/01/2020 secondary to GI bleeding -- re-started on 05/03/2020 due to recurrence of bilateral extensive DVT.  Patient was last seen by Dr. Einar Gip 07/12/2020 at which time he advised her to switch metolazone to as needed and continue daily Lasix. Review of Dr. Irven Shelling  note form last visit indicates they had a lengthy discussion regarding poor prognosis and end of life care.  Patient was advised to follow-up on an as-needed basis.  In the meantime patient has agreed to palliative care consult which is scheduled for June 14.  She now presents to our office with concerns of blood pressure management and shortness of breath.  Blood pressure is well controlled.  In regard to patient's shortness of breath, suspect this is related to volume overload secondary to malignant metastases to her liver.  Patient has ascites present on exam, and no JVD.  Patient does not appear to be in acute heart failure, but rather has ascites.  In view of this we will discontinue furosemide and start patient on torsemide 20 mg daily with additional doses as needed as well as spironolactone 12.5 mg daily.  Patient is scheduled to have repeat labs done in approximately 1 week with the cancer center, will trend BMP.  Again discussed at length with patient regarding poor prognosis and agree with palliative care consult.  Follow-up on an as-needed basis.  Patient was seen in collaboration with Dr. Einar Gip. He also reviewed patient's chart and examined the patient. Dr. Einar Gip is in agreement of the plan.    Alethia Berthold, MD, [facility] 09/09/2020, 12:04 PM Office: [phone]    ME:QASTM Marin Olp, MD

## 2020-09-08 NOTE — Progress Notes (Signed)
   HPI: 80 y.o. female nonambulatory PMHx chronic bilateral lower extremity edema, diabetes mellitus presenting today for evaluation of chronic leg and ankle pain that extends down to the posterior heel left lower extremity.  She also states that her skin is peeling to her left heel.  She presents for further treatment and evaluation  Past Medical History:  Diagnosis Date  . Cerebrovascular accident [facility]) 2001   left side weakness  . Chronic edema    ? venous insufficiency  . Colon cancer ([place]) 2020  . COPD (chronic obstructive pulmonary disease) ([place])    never had a problem breathing  . Diabetes mellitus without complication ([place])   . Dyspnea   . Enlarged heart    per pt  . Gallstones   . GERD (gastroesophageal reflux disease)   . Goals of care, counseling/discussion 11/01/2018  . History of radiation therapy 04/08/2020-05/21/2020   Colon;Dr. Gery Pray  . Hyperlipidemia   . Hypertension   . PONV (postoperative nausea and vomiting)    nausea      Physical Exam: General: The patient is alert and oriented x3 in no acute distress.  Dermatology: Skin is warm, dry and supple bilateral lower extremities. Negative for open lesions or macerations.  There is maceration of skin with superficial peeling of the epidermal layers to the posterior heel.  There is no open wound however.  No break in the dermis.  Vascular: Chronic bilateral lower extremity pitting edema.  Pulses nonpalpable due to the edema.  Skin is cold to touch bilateral  Neurological: Epicritic and protective threshold diminished bilaterally.   Musculoskeletal Exam: Patient nonambulatory in a wheelchair.  No pedal deformities noted  Assessment: 1.  Chronic bilateral lower extremity edema 2.  LLE leg, ankle, and heel pain possibly secondary to peripheral vascular disease 3.  Maceration of skin posterior heel without open wound   Plan of Care:  1. Patient evaluated.  2.  There is some concern due to possibly ischemic  pain.  Arterial duplex ordered left lower extremity. 3.  Due to the maceration and peeling of the skin, I did call in a prescription for doxycycline 100 mg 2 times daily #20 4.  Return to clinic in 2 weeks to review arterial duplex results and follow-up treatment and evaluation      Edrick Kins, DPM [facility]  Dr. Edrick Kins, DPM    [address], [place] [address]                Office [phone]  Fax [phone]

## 2020-09-09 ENCOUNTER — Encounter: Payer: Self-pay | Admitting: Student

## 2020-09-09 ENCOUNTER — Ambulatory Visit: Payer: Medicare HMO | Admitting: Student

## 2020-09-09 VITALS — BP 126/78 | HR 92 | Temp 98.0°F | Resp 16 | Ht 71.0 in

## 2020-09-09 DIAGNOSIS — R0601 Orthopnea: Secondary | ICD-10-CM | POA: Diagnosis not present

## 2020-09-09 DIAGNOSIS — I10 Essential (primary) hypertension: Secondary | ICD-10-CM | POA: Diagnosis not present

## 2020-09-09 DIAGNOSIS — R0600 Dyspnea, unspecified: Secondary | ICD-10-CM

## 2020-09-09 DIAGNOSIS — R600 Localized edema: Secondary | ICD-10-CM | POA: Diagnosis not present

## 2020-09-09 DIAGNOSIS — R0609 Other forms of dyspnea: Secondary | ICD-10-CM

## 2020-09-09 DIAGNOSIS — Z8673 Personal history of transient ischemic attack (TIA), and cerebral infarction without residual deficits: Secondary | ICD-10-CM

## 2020-09-09 MED ORDER — TORSEMIDE 20 MG PO TABS: 20.0000 mg | ORAL_TABLET | Freq: Every day | ORAL | 3 refills | Status: DC

## 2020-09-09 MED ORDER — SPIRONOLACTONE 25 MG PO TABS
12.5000 mg | ORAL_TABLET | Freq: Every day | ORAL | 3 refills | Status: DC
Start: 2020-09-09 — End: 2020-10-21

## 2020-09-10 ENCOUNTER — Telehealth: Payer: Self-pay | Admitting: Podiatry

## 2020-09-10 DIAGNOSIS — Z7901 Long term (current) use of anticoagulants: Secondary | ICD-10-CM | POA: Diagnosis not present

## 2020-09-10 DIAGNOSIS — E119 Type 2 diabetes mellitus without complications: Secondary | ICD-10-CM | POA: Diagnosis not present

## 2020-09-10 DIAGNOSIS — C229 Malignant neoplasm of liver, not specified as primary or secondary: Secondary | ICD-10-CM | POA: Diagnosis not present

## 2020-09-10 DIAGNOSIS — C189 Malignant neoplasm of colon, unspecified: Secondary | ICD-10-CM | POA: Diagnosis not present

## 2020-09-10 DIAGNOSIS — I69354 Hemiplegia and hemiparesis following cerebral infarction affecting left non-dominant side: Secondary | ICD-10-CM | POA: Diagnosis not present

## 2020-09-10 DIAGNOSIS — I4891 Unspecified atrial fibrillation: Secondary | ICD-10-CM | POA: Diagnosis not present

## 2020-09-10 DIAGNOSIS — S82831D Other fracture of upper and lower end of right fibula, subsequent encounter for closed fracture with routine healing: Secondary | ICD-10-CM | POA: Diagnosis not present

## 2020-09-10 DIAGNOSIS — I10 Essential (primary) hypertension: Secondary | ICD-10-CM | POA: Diagnosis not present

## 2020-09-10 DIAGNOSIS — Z794 Long term (current) use of insulin: Secondary | ICD-10-CM | POA: Diagnosis not present

## 2020-09-10 NOTE — Telephone Encounter (Signed)
Please add additional information to the previous order for ultrasound. Tonya [name] states, that there is another code that goes with he order for duplex. Please advise.

## 2020-09-13 ENCOUNTER — Inpatient Hospital Stay: Payer: Medicare HMO

## 2020-09-13 ENCOUNTER — Other Ambulatory Visit: Payer: Self-pay | Admitting: *Deleted

## 2020-09-13 ENCOUNTER — Other Ambulatory Visit: Payer: Self-pay

## 2020-09-13 ENCOUNTER — Other Ambulatory Visit: Payer: Self-pay | Admitting: Podiatry

## 2020-09-13 ENCOUNTER — Telehealth: Payer: Self-pay | Admitting: *Deleted

## 2020-09-13 ENCOUNTER — Inpatient Hospital Stay (HOSPITAL_BASED_OUTPATIENT_CLINIC_OR_DEPARTMENT_OTHER): Payer: Medicare HMO | Admitting: Hematology & Oncology

## 2020-09-13 ENCOUNTER — Other Ambulatory Visit: Payer: Self-pay | Admitting: Hematology & Oncology

## 2020-09-13 ENCOUNTER — Other Ambulatory Visit: Payer: Self-pay | Admitting: Family

## 2020-09-13 ENCOUNTER — Ambulatory Visit (HOSPITAL_BASED_OUTPATIENT_CLINIC_OR_DEPARTMENT_OTHER)
Admission: RE | Admit: 2020-09-13 | Discharge: 2020-09-13 | Disposition: A | Discharge status: Home / Self Care | Payer: Medicare HMO | Source: Ambulatory Visit | Attending: Hematology & Oncology | Admitting: Hematology & Oncology

## 2020-09-13 VITALS — BP 129/73 | HR 80 | Temp 98.1°F | Resp 20

## 2020-09-13 DIAGNOSIS — Z86711 Personal history of pulmonary embolism: Secondary | ICD-10-CM | POA: Diagnosis not present

## 2020-09-13 DIAGNOSIS — D500 Iron deficiency anemia secondary to blood loss (chronic): Secondary | ICD-10-CM

## 2020-09-13 DIAGNOSIS — C189 Malignant neoplasm of colon, unspecified: Secondary | ICD-10-CM

## 2020-09-13 DIAGNOSIS — Z95828 Presence of other vascular implants and grafts: Secondary | ICD-10-CM

## 2020-09-13 DIAGNOSIS — C787 Secondary malignant neoplasm of liver and intrahepatic bile duct: Secondary | ICD-10-CM

## 2020-09-13 DIAGNOSIS — D649 Anemia, unspecified: Secondary | ICD-10-CM

## 2020-09-13 DIAGNOSIS — Z5111 Encounter for antineoplastic chemotherapy: Secondary | ICD-10-CM | POA: Diagnosis not present

## 2020-09-13 DIAGNOSIS — Z923 Personal history of irradiation: Secondary | ICD-10-CM | POA: Diagnosis not present

## 2020-09-13 DIAGNOSIS — Z86718 Personal history of other venous thrombosis and embolism: Secondary | ICD-10-CM | POA: Diagnosis not present

## 2020-09-13 DIAGNOSIS — R600 Localized edema: Secondary | ICD-10-CM

## 2020-09-13 DIAGNOSIS — C182 Malignant neoplasm of ascending colon: Secondary | ICD-10-CM | POA: Diagnosis not present

## 2020-09-13 DIAGNOSIS — Z79899 Other long term (current) drug therapy: Secondary | ICD-10-CM | POA: Diagnosis not present

## 2020-09-13 DIAGNOSIS — C187 Malignant neoplasm of sigmoid colon: Secondary | ICD-10-CM | POA: Diagnosis not present

## 2020-09-13 DIAGNOSIS — Z7901 Long term (current) use of anticoagulants: Secondary | ICD-10-CM | POA: Diagnosis not present

## 2020-09-13 DIAGNOSIS — R188 Other ascites: Secondary | ICD-10-CM | POA: Diagnosis not present

## 2020-09-13 LAB — CEA (IN HOUSE-CHCC): CEA (CHCC-In House): 125.71 ng/mL — ABNORMAL HIGH (ref 0.00–5.00)

## 2020-09-13 LAB — CBC WITH DIFFERENTIAL (CANCER CENTER ONLY)
Abs Immature Granulocytes: 0.03 10*3/uL (ref 0.00–0.07)
Basophils Absolute: 0 10*3/uL (ref 0.0–0.1)
Basophils Relative: 0 %
Eosinophils Absolute: 0 10*3/uL (ref 0.0–0.5)
Eosinophils Relative: 1 %
HCT: 25.4 % — ABNORMAL LOW (ref 36.0–46.0)
Hemoglobin: 8.1 g/dL — ABNORMAL LOW (ref 12.0–15.0)
Immature Granulocytes: 1 %
Lymphocytes Relative: 7 %
Lymphs Abs: 0.5 10*3/uL — ABNORMAL LOW (ref 0.7–4.0)
MCH: 29.9 pg (ref 26.0–34.0)
MCHC: 31.9 g/dL (ref 30.0–36.0)
MCV: 93.7 fL (ref 80.0–100.0)
Monocytes Absolute: 0.7 10*3/uL (ref 0.1–1.0)
Monocytes Relative: 10 %
Neutro Abs: 5.3 10*3/uL (ref 1.7–7.7)
Neutrophils Relative %: 81 %
Platelet Count: 236 10*3/uL (ref 150–400)
RBC: 2.71 MIL/uL — ABNORMAL LOW (ref 3.87–5.11)
RDW: 18.5 % — ABNORMAL HIGH (ref 11.5–15.5)
WBC Count: 6.4 10*3/uL (ref 4.0–10.5)
nRBC: 0 % (ref 0.0–0.2)

## 2020-09-13 LAB — CMP (CANCER CENTER ONLY)
ALT: 16 U/L (ref 0–44)
AST: 69 U/L — ABNORMAL HIGH (ref 15–41)
Albumin: 2.8 g/dL — ABNORMAL LOW (ref 3.5–5.0)
Alkaline Phosphatase: 298 U/L — ABNORMAL HIGH (ref 38–126)
Anion gap: 8 (ref 5–15)
BUN: 15 mg/dL (ref 8–23)
CO2: 29 mmol/L (ref 22–32)
Calcium: 9.1 mg/dL (ref 8.9–10.3)
Chloride: 102 mmol/L (ref 98–111)
Creatinine: 0.81 mg/dL (ref 0.44–1.00)
GFR, Estimated: >60 mL/min (ref >60)
Glucose, Bld: 144 mg/dL — ABNORMAL HIGH (ref 70–99)
Potassium: 3 mmol/L — ABNORMAL LOW (ref 3.5–5.1)
Sodium: 139 mmol/L (ref 135–145)
Total Bilirubin: 0.9 mg/dL (ref 0.3–1.2)
Total Protein: 5.1 g/dL — ABNORMAL LOW (ref 6.5–8.1)

## 2020-09-13 LAB — FERRITIN: Ferritin: 1130 ng/mL — ABNORMAL HIGH (ref 11–307)

## 2020-09-13 LAB — IRON AND TIBC
Iron: 26 ug/dL — ABNORMAL LOW (ref 41–142)
Saturation Ratios: 17 % — ABNORMAL LOW (ref 21–57)
TIBC: 150 ug/dL — ABNORMAL LOW (ref 236–444)
UIBC: 124 ug/dL (ref 120–384)

## 2020-09-13 LAB — RETICULOCYTES
Immature Retic Fract: 23.6 % — ABNORMAL HIGH (ref 2.3–15.9)
RBC.: 2.71 MIL/uL — ABNORMAL LOW (ref 3.87–5.11)
Retic Count, Absolute: 179.7 10*3/uL (ref 19.0–186.0)
Retic Ct Pct: 6.6 % — ABNORMAL HIGH (ref 0.4–3.1)

## 2020-09-13 LAB — SAMPLE TO BLOOD BANK

## 2020-09-13 LAB — PREPARE RBC (CROSSMATCH)

## 2020-09-13 MED ORDER — HEPARIN SOD (PORK) LOCK FLUSH 100 UNIT/ML IV SOLN
500.0000 [IU] | Freq: Once | INTRAVENOUS | Status: DC | PRN
  Filled 2020-09-13: qty 5

## 2020-09-13 MED ORDER — SODIUM CHLORIDE 0.9 % IV SOLN
1440.0000 mg/m2 | INTRAVENOUS | Status: DC
  Administered 2020-09-13: 2750 mg via INTRAVENOUS
  Filled 2020-09-13: qty 55

## 2020-09-13 MED ORDER — SODIUM CHLORIDE 0.9% FLUSH
10.0000 mL | INTRAVENOUS | Status: DC | PRN
  Filled 2020-09-13: qty 10

## 2020-09-13 MED ORDER — ATROPINE SULFATE 1 MG/ML IJ SOLN
INTRAMUSCULAR | Status: AC
  Filled 2020-09-13: qty 1

## 2020-09-13 MED ORDER — FLUOROURACIL CHEMO INJECTION 2.5 GM/50ML
300.0000 mg/m2 | Freq: Once | INTRAVENOUS | Status: AC
  Administered 2020-09-13: 550 mg via INTRAVENOUS
  Filled 2020-09-13: qty 11

## 2020-09-13 MED ORDER — DIPHENHYDRAMINE HCL 25 MG PO CAPS: 25.0000 mg | ORAL_CAPSULE | Freq: Once | ORAL | Status: DC

## 2020-09-13 MED ORDER — SODIUM CHLORIDE 0.9 % IV SOLN
Freq: Once | INTRAVENOUS | Status: AC
  Filled 2020-09-13: qty 250

## 2020-09-13 MED ORDER — PALONOSETRON HCL INJECTION 0.25 MG/5ML
0.2500 mg | Freq: Once | INTRAVENOUS | Status: AC
  Administered 2020-09-13: 0.25 mg via INTRAVENOUS

## 2020-09-13 MED ORDER — SODIUM CHLORIDE 0.9 % IV SOLN
108.0000 mg/m2 | Freq: Once | INTRAVENOUS | Status: AC
  Administered 2020-09-13: 200 mg via INTRAVENOUS
  Filled 2020-09-13: qty 10

## 2020-09-13 MED ORDER — SODIUM CHLORIDE 0.9 % IV SOLN
10.0000 mg | Freq: Once | INTRAVENOUS | Status: AC
  Administered 2020-09-13: 10 mg via INTRAVENOUS
  Filled 2020-09-13: qty 10

## 2020-09-13 MED ORDER — ATROPINE SULFATE 1 MG/ML IJ SOLN
0.5000 mg | Freq: Once | INTRAMUSCULAR | Status: AC | PRN
  Administered 2020-09-13: 0.5 mg via INTRAVENOUS

## 2020-09-13 MED ORDER — PALONOSETRON HCL INJECTION 0.25 MG/5ML
INTRAVENOUS | Status: AC
  Filled 2020-09-13: qty 5

## 2020-09-13 MED ORDER — SODIUM CHLORIDE 0.9 % IV SOLN
400.0000 mg/m2 | Freq: Once | INTRAVENOUS | Status: AC
  Administered 2020-09-13: 764 mg via INTRAVENOUS
  Filled 2020-09-13: qty 25

## 2020-09-13 MED ORDER — SODIUM CHLORIDE 0.9 % IV SOLN
200.0000 mg | Freq: Once | INTRAVENOUS | Status: AC
  Administered 2020-09-13: 200 mg via INTRAVENOUS
  Filled 2020-09-13: qty 200

## 2020-09-13 NOTE — Progress Notes (Signed)
Hematology and Oncology Follow Up Visit  Tonya [name] [id number] 04/02/1941 80 y.o. 09/13/2020   Principle Diagnosis:  Stage IV adenocarcinoma of the ascending colon -- NRAS+, BRAF-, HER2-, MSI/MMR -,  Iron deficiency anemia DVT in LEFT leg-- Peroneal and popliteal vein  Past Therapy: FOLFOX - s/p cycle5 Xeloda with radiation therapy.  To started on 04/08/2020.  Patient will take 1500 mg of Xeloda daily. FOLFIRI/Bevacizumab -started 01/20/2019,s/p cycle #16 -- Avastin d/c'ed on 10/21/2019 due to DVT  Current Therapy: Lonsurf 60 mg po q day (5 on - 2 off - 5 on - 2 weeks off) - started 08/02/2020 -- d/c due to poor tolerance IV Iron as indicated Eliquis 5 mg po BID -- started on 09/30/2019 --DC on 04/01/2020 secondary to GI bleeding -- re-started on 05/03/2020 FOLFIRI -- start cycle #1 on 09/13/2020   Interim History:  Tonya [name] is here today with her daughter for follow-up.  We now will try 1 last chemotherapy protocol.  I we will have her on FOLFIRI.  I tried to get liposomal irinotecan but her insurance company would not cover the cost.  I am quite worried about her future.  I know that she is trying hard.  I know her family is trying hard with her.  I checked her prealbumin couple weeks ago.  It was only 6.8.  I explained that she and her daughter how ominous this is.  I told them that I have not not yet seen the patient who has cancer with a prealbumin less than 10 survive more than 6-8 weeks.  I told her that the only way that this can get better is if she would eat more.  I am just not sure she is able to do that.  She apparently had a reaction to the Dilaudid that we gave her last time she was here.  As such, we will not give her any further Dilaudid.  She is having some rectal bleeding.  I am sure this is probably from her tumor.  Her hemoglobin is only 8.  Her iron saturation is 17%.  We will give her a dose of IV iron today and then try to give her 2 units of  blood on Wednesday.  She sleeps a lot.  Thankfully, she is not hurting right now.  Her daughter is worried about ascites.  We did do an ultrasound of her abdomen.  She has minimal perihepatic fluid.  I told her that a lot of the abdominal swelling is more so from her malnutrition.  She may have a little bit of anasarca I guess.  I told her that there is no fluid that can be removed.  I would have to say that overall, her performance status is probably ECOG 2-3 at best.    Medications:  Allergies as of 09/13/2020      Reactions   Hydrocodone Swelling, Other (See Comments)   "I started swelling, became red, and passed out"   Iohexol Hives, Shortness Of Breath, Other (See Comments)   Patient developed hives and fullness in throat post injection of 125cc's Omni 300, Onset Date: 11/15/2006   Naproxen Other (See Comments)   "It made me feel out of my head"   Ibuprofen Nausea Only   Propoxyphene N-acetaminophen Other (See Comments)   "I couldn't find the door to make my way out of the room- I was in misery"      Medication List       Accurate as of Sep 13, 2020 10:22 AM. If you have any questions, ask your nurse or doctor.        B-D SINGLE USE SWABS REGULAR Pads 1 each by Does not apply route as needed (to cleanse site prior to obtaining droplet of blood for CBG's). E11.9   B-D ULTRAFINE III SHORT PEN 31G X 8 MM Misc Generic drug: Insulin Pen Needle   doxycycline 100 MG tablet Commonly known as: VIBRA-TABS Take 1 tablet (100 mg total) by mouth 2 (two) times daily.   Eliquis 5 MG Tabs tablet Generic drug: apixaban TAKE 1 TABLET(5 MG) BY MOUTH TWICE DAILY   famotidine 40 MG tablet Commonly known as: PEPCID TAKE 1 TABLET TWICE DAILY   ketoconazole 2 % cream Commonly known as: NIZORAL Apply 1 application topically 2 (two) times daily.   lidocaine-prilocaine cream Commonly known as: EMLA Apply 1 application topically as needed for up to 30 doses.   magic mouthwash  Soln Swish and swallow equal parts Benadryl, Lidocaine and Nystatin.  5- 10 ml.'s four times a day as needed.   meclizine 12.5 MG tablet Commonly known as: ANTIVERT TAKE 1 TABLET(12.5 MG) BY MOUTH THREE TIMES DAILY AS NEEDED FOR DIZZINESS   methylphenidate 10 MG tablet Commonly known as: Ritalin Take 1 tablet (10 mg total) by mouth 2 (two) times daily.   metoprolol succinate 25 MG 24 hr tablet Commonly known as: TOPROL-XL 25 mg daily.   prochlorperazine 10 MG tablet Commonly known as: COMPAZINE TAKE 1 TABLET(10 MG) BY MOUTH EVERY 6 HOURS AS NEEDED FOR NAUSEA OR VOMITING   spironolactone 25 MG tablet Commonly known as: ALDACTONE Take 0.5 tablets (12.5 mg total) by mouth daily.   terbinafine 1 % cream Commonly known as: LAMISIL Apply topically 2 (two) times daily. PRN   torsemide 20 MG tablet Commonly known as: DEMADEX Take 1 tablet (20 mg total) by mouth daily. With addition doses as needed.   traMADol 50 MG tablet Commonly known as: ULTRAM TAKE 1 TABLET(50 MG) BY MOUTH EVERY 6 HOURS AS NEEDED   Tresiba FlexTouch 100 UNIT/ML FlexTouch Pen Generic drug: insulin degludec Inject 0.21 mLs (21 Units total) into the skin daily. Take 34 units on chemo days What changed: how much to take   True Metrix Blood Glucose Test test strip Generic drug: glucose blood TEST BLOOD SUGAR TWICE DAILY   TRUEplus Lancets 30G Misc 1 each by Does not apply route 2 (two) times daily. E11.9   vitamin B-12 500 MCG tablet Commonly known as: CYANOCOBALAMIN Take 500 mcg by mouth daily.       Allergies:  Allergies  Allergen Reactions  . Hydrocodone Swelling and Other (See Comments)    "I started swelling, became red, and passed out"  . Iohexol Hives, Shortness Of Breath and Other (See Comments)    Patient developed hives and fullness in throat post injection of 125cc's Omni 300, Onset Date: 11/15/2006   . Naproxen Other (See Comments)    "It made me feel out of my head"  . Ibuprofen  Nausea Only  . Propoxyphene N-Acetaminophen Other (See Comments)    "I couldn't find the door to make my way out of the room- I was in misery"    Past Medical History, Surgical history, Social history, and Family History were reviewed and updated.  Review of Systems: Review of Systems  Constitutional: Positive for fever, malaise/fatigue and weight loss.  HENT: Negative.   Eyes: Negative.   Respiratory: Positive for shortness of breath.   Cardiovascular: Positive  for leg swelling.  Gastrointestinal: Positive for abdominal pain and nausea.  Genitourinary: Negative.   Musculoskeletal: Positive for joint pain.  Skin: Negative.   Neurological: Negative.   Endo/Heme/Allergies: Negative.   Psychiatric/Behavioral: Negative.      Physical Exam:  oral temperature is 98.1 F (36.7 C). Her blood pressure is 129/73 and her pulse is 80. Her respiration is 20 and oxygen saturation is 100%.   Wt Readings from Last 3 Encounters:  07/12/20 161 lb (73 kg)  02/16/20 182 lb (82.6 kg)  01/29/20 187 lb 12.8 oz (85.2 kg)    Physical Exam Vitals reviewed.  HENT:     Head: Normocephalic and atraumatic.  Eyes:     Pupils: Pupils are equal, round, and reactive to light.  Cardiovascular:     Rate and Rhythm: Normal rate and regular rhythm.     Heart sounds: Normal heart sounds.  Pulmonary:     Effort: Pulmonary effort is normal.     Breath sounds: Normal breath sounds.  Abdominal:     General: Bowel sounds are normal.     Palpations: Abdomen is soft.  Musculoskeletal:        General: No tenderness or deformity. Normal range of motion.     Cervical back: Normal range of motion.     Comments: Extremities shows a lot of edema in the legs.  There is probably some more edema in the right leg than the left leg.  She has decreased range of motion of her lower extremities.  Lymphadenopathy:     Cervical: No cervical adenopathy.  Skin:    General: Skin is warm and dry.     Findings: No erythema or  rash.  Neurological:     Mental Status: She is alert and oriented to person, place, and time.  Psychiatric:        Behavior: Behavior normal.        Thought Content: Thought content normal.        Judgment: Judgment normal.      Lab Results  Component Value Date   WBC 7.3 08/31/2020   HGB 9.5 (L) 08/31/2020   HCT 29.6 (L) 08/31/2020   MCV 90.8 08/31/2020   PLT 265 08/31/2020   Lab Results  Component Value Date   FERRITIN 1,801 (H) 08/31/2020   IRON 33 (L) 08/31/2020   TIBC 151 (L) 08/31/2020   UIBC 118 (L) 08/31/2020   IRONPCTSAT 22 08/31/2020   Lab Results  Component Value Date   RETICCTPCT 3.9 (H) 08/31/2020   RBC 3.22 (L) 08/31/2020   RETICCTABS 62,400 05/17/2016   No results found for: KPAFRELGTCHN, LAMBDASER, KAPLAMBRATIO No results found for: IGGSERUM, IGA, IGMSERUM No results found for: Odetta Pink, SPEI   Chemistry      Component Value Date/Time   NA 140 08/31/2020 1127   NA 139 09/30/2018 1311   K 4.4 08/31/2020 1127   CL 105 08/31/2020 1127   CO2 29 08/31/2020 1127   BUN 16 08/31/2020 1127   BUN 10 09/30/2018 1311   CREATININE 0.75 08/31/2020 1127      Component Value Date/Time   CALCIUM 9.0 08/31/2020 1127   ALKPHOS 453 (H) 08/31/2020 1127   AST 99 (H) 08/31/2020 1127   ALT 28 08/31/2020 1127   BILITOT 1.0 08/31/2020 1127       Impression and Plan: Tonya [name] is a very pleasant 80 yo African American female with metastatic colon cancer.  Again, I think it  is clear that she just is not tolerating the Lonsurf.  Her quality of life  is not that great.   Again, she just does not want to give up as of yet.  Our options are incredibly limited.  I will try her with FOLFIRI.  Has been a couple years since she has had this.  We will have to see how she responds, if she does respond.  Her CEA level today was 126.  This is relatively stable compared to 2 weeks ago.  Again, I just do not want to see  Tonya [name] be miserable.  Hopefully, she will not have any difficulty with this protocol.  I made some dosage adjustments so that she will hopefully tolerate it reasonably well.  Hopefully, we will transfuse her in 2 days and this will help her overall performance status.  We will give her some IV iron today.  I realize this is a very tough situation.  We had a very good prayer at the end of our meeting.  I know that Tonya [name]'s faith is incredibly strong.  I know that she feels that the good Reita Cliche will continue to take care of her.  I told her that it might be God's will that she go to heaven and he can take care of her there.  We will plan to get her back in 2 more weeks.   [name], MD 5/23/202210:22 AM

## 2020-09-13 NOTE — Patient Instructions (Signed)
[facility]  Discharge Instructions: °Thank you for choosing [facility] to provide your oncology and hematology care.  ° °If you have a lab appointment with the Cancer Center, please go directly to the Cancer Center and check in at the registration area. ° °Wear comfortable clothing and clothing appropriate for easy access to any Portacath or PICC line.  ° °We strive to give you quality time with your provider. You may need to reschedule your appointment if you arrive late (15 or more minutes).  Arriving late affects you and other patients whose appointments are after yours.  Also, if you miss three or more appointments without notifying the office, you may be dismissed from the clinic at the provider’s discretion.    °  °For prescription refill requests, have your pharmacy contact our office and allow 72 hours for refills to be completed.   ° °Today you received the following chemotherapy and/or immunotherapy agents Irinotecan, Leucovorin and 5FU  °  °To help prevent nausea and vomiting after your treatment, we encourage you to take your nausea medication as directed. ° °BELOW ARE SYMPTOMS THAT SHOULD BE REPORTED IMMEDIATELY: °*FEVER GREATER THAN 100.4 F (38 °C) OR HIGHER °*CHILLS OR SWEATING °*NAUSEA AND VOMITING THAT IS NOT CONTROLLED WITH YOUR NAUSEA MEDICATION °*UNUSUAL SHORTNESS OF BREATH °*UNUSUAL BRUISING OR BLEEDING °*URINARY PROBLEMS (pain or burning when urinating, or frequent urination) °*BOWEL PROBLEMS (unusual diarrhea, constipation, pain near the anus) °TENDERNESS IN MOUTH AND THROAT WITH OR WITHOUT PRESENCE OF ULCERS (sore throat, sores in mouth, or a toothache) °UNUSUAL RASH, SWELLING OR PAIN  °UNUSUAL VAGINAL DISCHARGE OR ITCHING  ° °Items with * indicate a potential emergency and should be followed up as soon as possible or go to the Emergency Department if any problems should occur. ° °Please show the CHEMOTHERAPY ALERT CARD or IMMUNOTHERAPY ALERT CARD  at check-in to the Emergency Department and triage nurse. °Should you have questions after your visit or need to cancel or reschedule your appointment, please contact [facility]  [phone] and follow the prompts.  Office hours are 8:00 a.m. to 4:30 p.m. Monday - Friday. Please note that voicemails left after 4:00 p.m. may not be returned until the following business day.  We are closed weekends and major holidays. You have access to a nurse at all times for urgent questions. Please call the main number to the clinic [phone] and follow the prompts. ° °For any non-urgent questions, you may also contact your provider using MyChart. We now offer e-Visits for anyone 18 and older to request care online for non-urgent symptoms. For details visit [email or web address]. °  °Also download the MyChart app! Go to the app store, search "MyChart", open the app, select [facility], and log in with your MyChart username and password. ° °Due to Covid, a mask is required upon entering the hospital/clinic. If you do not have a mask, one will be given to you upon arrival. For doctor visits, patients may have 1 support person aged 18 or older with them. For treatment visits, patients cannot have anyone with them due to current Covid guidelines and our immunocompromised population.  °

## 2020-09-13 NOTE — Telephone Encounter (Signed)
[facility] ([id number])are calling to request an order for the ABI,goes along with the other orders,waiting for the status on a 2nd order. Please contact if questions, patient has been scheduled already but will need that other order asap.

## 2020-09-13 NOTE — Progress Notes (Signed)
Reviewed blood work with Dr Marin Olp.  Ok to proceed with treatment today.

## 2020-09-13 NOTE — Patient Instructions (Signed)

## 2020-09-14 ENCOUNTER — Telehealth: Payer: Self-pay

## 2020-09-14 DIAGNOSIS — I10 Essential (primary) hypertension: Secondary | ICD-10-CM | POA: Diagnosis not present

## 2020-09-14 DIAGNOSIS — S82831D Other fracture of upper and lower end of right fibula, subsequent encounter for closed fracture with routine healing: Secondary | ICD-10-CM | POA: Diagnosis not present

## 2020-09-14 DIAGNOSIS — Z794 Long term (current) use of insulin: Secondary | ICD-10-CM | POA: Diagnosis not present

## 2020-09-14 DIAGNOSIS — Z7901 Long term (current) use of anticoagulants: Secondary | ICD-10-CM | POA: Diagnosis not present

## 2020-09-14 DIAGNOSIS — I69354 Hemiplegia and hemiparesis following cerebral infarction affecting left non-dominant side: Secondary | ICD-10-CM | POA: Diagnosis not present

## 2020-09-14 DIAGNOSIS — E119 Type 2 diabetes mellitus without complications: Secondary | ICD-10-CM | POA: Diagnosis not present

## 2020-09-14 DIAGNOSIS — C189 Malignant neoplasm of colon, unspecified: Secondary | ICD-10-CM | POA: Diagnosis not present

## 2020-09-14 DIAGNOSIS — C229 Malignant neoplasm of liver, not specified as primary or secondary: Secondary | ICD-10-CM | POA: Diagnosis not present

## 2020-09-14 DIAGNOSIS — I4891 Unspecified atrial fibrillation: Secondary | ICD-10-CM | POA: Diagnosis not present

## 2020-09-14 NOTE — Telephone Encounter (Signed)
Nutrition  Received secure chat message from Charlsie Merles RN that daughter wanted to speak with RD.    RD called daughter this pm.  No answer. Left message with RD's call back number.   Darci Lykins B. Zenia Resides, [place], [place] Registered Dietitian [phone] (mobile)

## 2020-09-15 ENCOUNTER — Other Ambulatory Visit: Payer: Self-pay

## 2020-09-15 ENCOUNTER — Inpatient Hospital Stay: Payer: Medicare HMO

## 2020-09-15 DIAGNOSIS — C182 Malignant neoplasm of ascending colon: Secondary | ICD-10-CM | POA: Diagnosis not present

## 2020-09-15 DIAGNOSIS — D649 Anemia, unspecified: Secondary | ICD-10-CM

## 2020-09-15 DIAGNOSIS — Z79899 Other long term (current) drug therapy: Secondary | ICD-10-CM | POA: Diagnosis not present

## 2020-09-15 DIAGNOSIS — C189 Malignant neoplasm of colon, unspecified: Secondary | ICD-10-CM

## 2020-09-15 DIAGNOSIS — Z7901 Long term (current) use of anticoagulants: Secondary | ICD-10-CM | POA: Diagnosis not present

## 2020-09-15 DIAGNOSIS — Z86711 Personal history of pulmonary embolism: Secondary | ICD-10-CM | POA: Diagnosis not present

## 2020-09-15 DIAGNOSIS — Z86718 Personal history of other venous thrombosis and embolism: Secondary | ICD-10-CM | POA: Diagnosis not present

## 2020-09-15 DIAGNOSIS — Z5111 Encounter for antineoplastic chemotherapy: Secondary | ICD-10-CM | POA: Diagnosis not present

## 2020-09-15 DIAGNOSIS — D500 Iron deficiency anemia secondary to blood loss (chronic): Secondary | ICD-10-CM | POA: Diagnosis not present

## 2020-09-15 DIAGNOSIS — Z923 Personal history of irradiation: Secondary | ICD-10-CM | POA: Diagnosis not present

## 2020-09-15 MED ORDER — HEPARIN SOD (PORK) LOCK FLUSH 100 UNIT/ML IV SOLN
250.0000 [IU] | INTRAVENOUS | Status: DC | PRN
  Filled 2020-09-15: qty 5

## 2020-09-15 MED ORDER — SODIUM CHLORIDE 0.9% FLUSH
10.0000 mL | INTRAVENOUS | Status: DC | PRN
  Filled 2020-09-15: qty 10

## 2020-09-15 MED ORDER — SODIUM CHLORIDE 0.9% IV SOLUTION
250.0000 mL | Freq: Once | INTRAVENOUS | Status: AC
  Administered 2020-09-15: 250 mL via INTRAVENOUS
  Filled 2020-09-15: qty 250

## 2020-09-15 MED ORDER — SODIUM CHLORIDE 0.9% FLUSH
10.0000 mL | INTRAVENOUS | Status: DC | PRN
  Administered 2020-09-15: 10 mL
  Filled 2020-09-15: qty 10

## 2020-09-15 MED ORDER — HEPARIN SOD (PORK) LOCK FLUSH 100 UNIT/ML IV SOLN
500.0000 [IU] | Freq: Once | INTRAVENOUS | Status: AC | PRN
  Administered 2020-09-15: 500 [IU]
  Filled 2020-09-15: qty 5

## 2020-09-15 NOTE — Patient Instructions (Signed)

## 2020-09-16 DIAGNOSIS — I10 Essential (primary) hypertension: Secondary | ICD-10-CM | POA: Diagnosis not present

## 2020-09-16 DIAGNOSIS — Z7901 Long term (current) use of anticoagulants: Secondary | ICD-10-CM | POA: Diagnosis not present

## 2020-09-16 DIAGNOSIS — S82831D Other fracture of upper and lower end of right fibula, subsequent encounter for closed fracture with routine healing: Secondary | ICD-10-CM | POA: Diagnosis not present

## 2020-09-16 DIAGNOSIS — I69354 Hemiplegia and hemiparesis following cerebral infarction affecting left non-dominant side: Secondary | ICD-10-CM | POA: Diagnosis not present

## 2020-09-16 DIAGNOSIS — I4891 Unspecified atrial fibrillation: Secondary | ICD-10-CM | POA: Diagnosis not present

## 2020-09-16 DIAGNOSIS — Z794 Long term (current) use of insulin: Secondary | ICD-10-CM | POA: Diagnosis not present

## 2020-09-16 DIAGNOSIS — E119 Type 2 diabetes mellitus without complications: Secondary | ICD-10-CM | POA: Diagnosis not present

## 2020-09-16 DIAGNOSIS — C189 Malignant neoplasm of colon, unspecified: Secondary | ICD-10-CM | POA: Diagnosis not present

## 2020-09-16 DIAGNOSIS — C229 Malignant neoplasm of liver, not specified as primary or secondary: Secondary | ICD-10-CM | POA: Diagnosis not present

## 2020-09-16 LAB — BPAM RBC
Blood Product Expiration Date: 202206172359
Blood Product Expiration Date: 202206172359
ISSUE DATE / TIME: 202205250751
ISSUE DATE / TIME: 202205250751
Unit Type and Rh: 6200
Unit Type and Rh: 6200

## 2020-09-16 LAB — TYPE AND SCREEN
ABO/RH(D): A POS
Antibody Screen: POSITIVE
DAT, IgG: NEGATIVE
Donor AG Type: NEGATIVE
Donor AG Type: NEGATIVE
PT AG Type: NEGATIVE
Unit division: 0
Unit division: 0

## 2020-09-17 ENCOUNTER — Ambulatory Visit
Admission: RE | Admit: 2020-09-17 | Discharge: 2020-09-17 | Disposition: A | Discharge status: Home / Self Care | Payer: Medicare HMO | Source: Ambulatory Visit | Attending: Podiatry | Admitting: Podiatry

## 2020-09-17 ENCOUNTER — Encounter: Payer: Self-pay | Admitting: Podiatry

## 2020-09-17 ENCOUNTER — Other Ambulatory Visit: Payer: Medicare HMO

## 2020-09-17 DIAGNOSIS — R600 Localized edema: Secondary | ICD-10-CM

## 2020-09-19 DIAGNOSIS — I4891 Unspecified atrial fibrillation: Secondary | ICD-10-CM | POA: Diagnosis not present

## 2020-09-19 DIAGNOSIS — I10 Essential (primary) hypertension: Secondary | ICD-10-CM | POA: Diagnosis not present

## 2020-09-19 DIAGNOSIS — Z794 Long term (current) use of insulin: Secondary | ICD-10-CM | POA: Diagnosis not present

## 2020-09-19 DIAGNOSIS — I69354 Hemiplegia and hemiparesis following cerebral infarction affecting left non-dominant side: Secondary | ICD-10-CM | POA: Diagnosis not present

## 2020-09-19 DIAGNOSIS — E119 Type 2 diabetes mellitus without complications: Secondary | ICD-10-CM | POA: Diagnosis not present

## 2020-09-19 DIAGNOSIS — S82831D Other fracture of upper and lower end of right fibula, subsequent encounter for closed fracture with routine healing: Secondary | ICD-10-CM | POA: Diagnosis not present

## 2020-09-19 DIAGNOSIS — C189 Malignant neoplasm of colon, unspecified: Secondary | ICD-10-CM | POA: Diagnosis not present

## 2020-09-19 DIAGNOSIS — Z7901 Long term (current) use of anticoagulants: Secondary | ICD-10-CM | POA: Diagnosis not present

## 2020-09-19 DIAGNOSIS — C229 Malignant neoplasm of liver, not specified as primary or secondary: Secondary | ICD-10-CM | POA: Diagnosis not present

## 2020-09-21 DIAGNOSIS — Z7901 Long term (current) use of anticoagulants: Secondary | ICD-10-CM | POA: Diagnosis not present

## 2020-09-21 DIAGNOSIS — I10 Essential (primary) hypertension: Secondary | ICD-10-CM | POA: Diagnosis not present

## 2020-09-21 DIAGNOSIS — Z794 Long term (current) use of insulin: Secondary | ICD-10-CM | POA: Diagnosis not present

## 2020-09-21 DIAGNOSIS — C229 Malignant neoplasm of liver, not specified as primary or secondary: Secondary | ICD-10-CM | POA: Diagnosis not present

## 2020-09-21 DIAGNOSIS — S82831D Other fracture of upper and lower end of right fibula, subsequent encounter for closed fracture with routine healing: Secondary | ICD-10-CM | POA: Diagnosis not present

## 2020-09-21 DIAGNOSIS — C189 Malignant neoplasm of colon, unspecified: Secondary | ICD-10-CM | POA: Diagnosis not present

## 2020-09-21 DIAGNOSIS — E119 Type 2 diabetes mellitus without complications: Secondary | ICD-10-CM | POA: Diagnosis not present

## 2020-09-21 DIAGNOSIS — I4891 Unspecified atrial fibrillation: Secondary | ICD-10-CM | POA: Diagnosis not present

## 2020-09-21 DIAGNOSIS — I69354 Hemiplegia and hemiparesis following cerebral infarction affecting left non-dominant side: Secondary | ICD-10-CM | POA: Diagnosis not present

## 2020-09-21 NOTE — Telephone Encounter (Signed)
Completed.

## 2020-09-22 DIAGNOSIS — D630 Anemia in neoplastic disease: Secondary | ICD-10-CM | POA: Diagnosis not present

## 2020-09-22 DIAGNOSIS — I10 Essential (primary) hypertension: Secondary | ICD-10-CM | POA: Diagnosis not present

## 2020-09-22 DIAGNOSIS — E119 Type 2 diabetes mellitus without complications: Secondary | ICD-10-CM | POA: Diagnosis not present

## 2020-09-22 DIAGNOSIS — R262 Difficulty in walking, not elsewhere classified: Secondary | ICD-10-CM | POA: Diagnosis not present

## 2020-09-22 DIAGNOSIS — C189 Malignant neoplasm of colon, unspecified: Secondary | ICD-10-CM | POA: Diagnosis not present

## 2020-09-22 DIAGNOSIS — Z794 Long term (current) use of insulin: Secondary | ICD-10-CM | POA: Diagnosis not present

## 2020-09-22 DIAGNOSIS — C229 Malignant neoplasm of liver, not specified as primary or secondary: Secondary | ICD-10-CM | POA: Diagnosis not present

## 2020-09-22 DIAGNOSIS — Z8673 Personal history of transient ischemic attack (TIA), and cerebral infarction without residual deficits: Secondary | ICD-10-CM | POA: Diagnosis not present

## 2020-09-22 DIAGNOSIS — Z7901 Long term (current) use of anticoagulants: Secondary | ICD-10-CM | POA: Diagnosis not present

## 2020-09-22 DIAGNOSIS — R601 Generalized edema: Secondary | ICD-10-CM | POA: Diagnosis not present

## 2020-09-22 DIAGNOSIS — D500 Iron deficiency anemia secondary to blood loss (chronic): Secondary | ICD-10-CM | POA: Diagnosis not present

## 2020-09-22 DIAGNOSIS — S82831D Other fracture of upper and lower end of right fibula, subsequent encounter for closed fracture with routine healing: Secondary | ICD-10-CM | POA: Diagnosis not present

## 2020-09-22 DIAGNOSIS — I4891 Unspecified atrial fibrillation: Secondary | ICD-10-CM | POA: Diagnosis not present

## 2020-09-22 DIAGNOSIS — I69354 Hemiplegia and hemiparesis following cerebral infarction affecting left non-dominant side: Secondary | ICD-10-CM | POA: Diagnosis not present

## 2020-09-22 DIAGNOSIS — E1169 Type 2 diabetes mellitus with other specified complication: Secondary | ICD-10-CM | POA: Diagnosis not present

## 2020-09-22 DIAGNOSIS — I82502 Chronic embolism and thrombosis of unspecified deep veins of left lower extremity: Secondary | ICD-10-CM | POA: Diagnosis not present

## 2020-09-23 ENCOUNTER — Telehealth: Payer: Self-pay

## 2020-09-23 NOTE — Telephone Encounter (Signed)
Nutrition  RD's second attempt to reach patient's daughter.  No call back from 1st attempt on 09/14/20 (see note for this date).  Called patient's daughter but no answer and no option to leave voice mail and mailbox is full.    RD available as needed.  Please reach out to RD if daughter still interested in nutrition services.   Julez Huseby B. Zenia Resides, [place], [place] Registered Dietitian [phone] (mobile)

## 2020-09-24 ENCOUNTER — Other Ambulatory Visit: Payer: Medicare HMO

## 2020-09-24 DIAGNOSIS — I10 Essential (primary) hypertension: Secondary | ICD-10-CM | POA: Diagnosis not present

## 2020-09-24 DIAGNOSIS — Z7901 Long term (current) use of anticoagulants: Secondary | ICD-10-CM | POA: Diagnosis not present

## 2020-09-24 DIAGNOSIS — I4891 Unspecified atrial fibrillation: Secondary | ICD-10-CM | POA: Diagnosis not present

## 2020-09-24 DIAGNOSIS — Z794 Long term (current) use of insulin: Secondary | ICD-10-CM | POA: Diagnosis not present

## 2020-09-24 DIAGNOSIS — E119 Type 2 diabetes mellitus without complications: Secondary | ICD-10-CM | POA: Diagnosis not present

## 2020-09-24 DIAGNOSIS — C189 Malignant neoplasm of colon, unspecified: Secondary | ICD-10-CM | POA: Diagnosis not present

## 2020-09-24 DIAGNOSIS — S82831D Other fracture of upper and lower end of right fibula, subsequent encounter for closed fracture with routine healing: Secondary | ICD-10-CM | POA: Diagnosis not present

## 2020-09-24 DIAGNOSIS — C229 Malignant neoplasm of liver, not specified as primary or secondary: Secondary | ICD-10-CM | POA: Diagnosis not present

## 2020-09-24 DIAGNOSIS — I69354 Hemiplegia and hemiparesis following cerebral infarction affecting left non-dominant side: Secondary | ICD-10-CM | POA: Diagnosis not present

## 2020-09-27 ENCOUNTER — Other Ambulatory Visit: Payer: Self-pay | Admitting: Hematology & Oncology

## 2020-09-27 DIAGNOSIS — R42 Dizziness and giddiness: Secondary | ICD-10-CM

## 2020-09-28 ENCOUNTER — Inpatient Hospital Stay: Payer: Medicare HMO | Attending: Hematology & Oncology

## 2020-09-28 ENCOUNTER — Other Ambulatory Visit: Payer: Medicare HMO

## 2020-09-28 ENCOUNTER — Inpatient Hospital Stay: Payer: Medicare HMO

## 2020-09-28 ENCOUNTER — Inpatient Hospital Stay (HOSPITAL_BASED_OUTPATIENT_CLINIC_OR_DEPARTMENT_OTHER): Payer: Medicare HMO | Admitting: Hematology & Oncology

## 2020-09-28 ENCOUNTER — Other Ambulatory Visit: Payer: Self-pay

## 2020-09-28 VITALS — BP 110/74 | HR 109 | Temp 99.0°F | Resp 18

## 2020-09-28 DIAGNOSIS — Z86718 Personal history of other venous thrombosis and embolism: Secondary | ICD-10-CM | POA: Diagnosis not present

## 2020-09-28 DIAGNOSIS — Z5111 Encounter for antineoplastic chemotherapy: Secondary | ICD-10-CM | POA: Insufficient documentation

## 2020-09-28 DIAGNOSIS — E8770 Fluid overload, unspecified: Secondary | ICD-10-CM | POA: Diagnosis not present

## 2020-09-28 DIAGNOSIS — Z7901 Long term (current) use of anticoagulants: Secondary | ICD-10-CM | POA: Diagnosis not present

## 2020-09-28 DIAGNOSIS — C182 Malignant neoplasm of ascending colon: Secondary | ICD-10-CM | POA: Diagnosis not present

## 2020-09-28 DIAGNOSIS — Z79899 Other long term (current) drug therapy: Secondary | ICD-10-CM | POA: Diagnosis not present

## 2020-09-28 DIAGNOSIS — C187 Malignant neoplasm of sigmoid colon: Secondary | ICD-10-CM | POA: Diagnosis not present

## 2020-09-28 DIAGNOSIS — C787 Secondary malignant neoplasm of liver and intrahepatic bile duct: Secondary | ICD-10-CM

## 2020-09-28 DIAGNOSIS — C189 Malignant neoplasm of colon, unspecified: Secondary | ICD-10-CM

## 2020-09-28 DIAGNOSIS — D500 Iron deficiency anemia secondary to blood loss (chronic): Secondary | ICD-10-CM | POA: Diagnosis not present

## 2020-09-28 LAB — CBC WITH DIFFERENTIAL (CANCER CENTER ONLY)
Abs Immature Granulocytes: 0.02 10*3/uL (ref 0.00–0.07)
Basophils Absolute: 0 10*3/uL (ref 0.0–0.1)
Basophils Relative: 1 %
Eosinophils Absolute: 0 10*3/uL (ref 0.0–0.5)
Eosinophils Relative: 0 %
HCT: 32.1 % — ABNORMAL LOW (ref 36.0–46.0)
Hemoglobin: 10.3 g/dL — ABNORMAL LOW (ref 12.0–15.0)
Immature Granulocytes: 1 %
Lymphocytes Relative: 16 %
Lymphs Abs: 0.5 10*3/uL — ABNORMAL LOW (ref 0.7–4.0)
MCH: 29 pg (ref 26.0–34.0)
MCHC: 32.1 g/dL (ref 30.0–36.0)
MCV: 90.4 fL (ref 80.0–100.0)
Monocytes Absolute: 0.7 10*3/uL (ref 0.1–1.0)
Monocytes Relative: 23 %
Neutro Abs: 1.9 10*3/uL (ref 1.7–7.7)
Neutrophils Relative %: 59 %
Platelet Count: 230 10*3/uL (ref 150–400)
RBC: 3.55 MIL/uL — ABNORMAL LOW (ref 3.87–5.11)
RDW: 15.9 % — ABNORMAL HIGH (ref 11.5–15.5)
WBC Count: 3.2 10*3/uL — ABNORMAL LOW (ref 4.0–10.5)
nRBC: 0 % (ref 0.0–0.2)

## 2020-09-28 LAB — IRON AND TIBC
Iron: 40 ug/dL — ABNORMAL LOW (ref 41–142)
Saturation Ratios: 28 % (ref 21–57)
TIBC: 140 ug/dL — ABNORMAL LOW (ref 236–444)
UIBC: 100 ug/dL — ABNORMAL LOW (ref 120–384)

## 2020-09-28 LAB — CMP (CANCER CENTER ONLY)
ALT: 22 U/L (ref 0–44)
AST: 42 U/L — ABNORMAL HIGH (ref 15–41)
Albumin: 2.8 g/dL — ABNORMAL LOW (ref 3.5–5.0)
Alkaline Phosphatase: 337 U/L — ABNORMAL HIGH (ref 38–126)
Anion gap: 8 (ref 5–15)
BUN: 26 mg/dL — ABNORMAL HIGH (ref 8–23)
CO2: 32 mmol/L (ref 22–32)
Calcium: 9.5 mg/dL (ref 8.9–10.3)
Chloride: 99 mmol/L (ref 98–111)
Creatinine: 0.78 mg/dL (ref 0.44–1.00)
GFR, Estimated: >60 mL/min (ref >60)
Glucose, Bld: 225 mg/dL — ABNORMAL HIGH (ref 70–99)
Potassium: 3.3 mmol/L — ABNORMAL LOW (ref 3.5–5.1)
Sodium: 139 mmol/L (ref 135–145)
Total Bilirubin: 1.3 mg/dL — ABNORMAL HIGH (ref 0.3–1.2)
Total Protein: 5.2 g/dL — ABNORMAL LOW (ref 6.5–8.1)

## 2020-09-28 LAB — CEA (IN HOUSE-CHCC): CEA (CHCC-In House): 138.72 ng/mL — ABNORMAL HIGH (ref 0.00–5.00)

## 2020-09-28 LAB — FERRITIN: Ferritin: 2031 ng/mL — ABNORMAL HIGH (ref 11–307)

## 2020-09-28 LAB — SAMPLE TO BLOOD BANK

## 2020-09-28 LAB — PREALBUMIN: Prealbumin: 5.8 mg/dL — ABNORMAL LOW (ref 18–38)

## 2020-09-28 MED ORDER — ATROPINE SULFATE 1 MG/ML IJ SOLN
INTRAMUSCULAR | Status: AC
  Filled 2020-09-28: qty 1

## 2020-09-28 MED ORDER — SODIUM CHLORIDE 0.9 % IV SOLN
10.0000 mg | Freq: Once | INTRAVENOUS | Status: AC
  Administered 2020-09-28: 10 mg via INTRAVENOUS
  Filled 2020-09-28: qty 10

## 2020-09-28 MED ORDER — HEPARIN SOD (PORK) LOCK FLUSH 100 UNIT/ML IV SOLN
500.0000 [IU] | Freq: Once | INTRAVENOUS | Status: DC | PRN
  Filled 2020-09-28: qty 5

## 2020-09-28 MED ORDER — SODIUM CHLORIDE 0.9 % IV SOLN
Freq: Once | INTRAVENOUS | Status: DC
  Filled 2020-09-28: qty 250

## 2020-09-28 MED ORDER — SODIUM CHLORIDE 0.9 % IV SOLN
Freq: Once | INTRAVENOUS | Status: AC
Start: 2020-09-28 — End: 2020-09-28
  Filled 2020-09-28: qty 250

## 2020-09-28 MED ORDER — PALONOSETRON HCL INJECTION 0.25 MG/5ML
0.2500 mg | Freq: Once | INTRAVENOUS | Status: AC
  Administered 2020-09-28: 0.25 mg via INTRAVENOUS

## 2020-09-28 MED ORDER — SODIUM CHLORIDE 0.9 % IV SOLN
400.0000 mg/m2 | Freq: Once | INTRAVENOUS | Status: AC
  Administered 2020-09-28: 764 mg via INTRAVENOUS
  Filled 2020-09-28: qty 25

## 2020-09-28 MED ORDER — SODIUM CHLORIDE 0.9 % IV SOLN
1440.0000 mg/m2 | INTRAVENOUS | Status: DC
  Administered 2020-09-28: 2750 mg via INTRAVENOUS
  Filled 2020-09-28: qty 55

## 2020-09-28 MED ORDER — SODIUM CHLORIDE 0.9 % IV SOLN
108.0000 mg/m2 | Freq: Once | INTRAVENOUS | Status: AC
  Administered 2020-09-28: 200 mg via INTRAVENOUS
  Filled 2020-09-28: qty 10

## 2020-09-28 MED ORDER — SODIUM CHLORIDE 0.9 % IV SOLN
Freq: Once | INTRAVENOUS | Status: AC
  Filled 2020-09-28: qty 250

## 2020-09-28 MED ORDER — SODIUM CHLORIDE 0.9% FLUSH
10.0000 mL | INTRAVENOUS | Status: DC | PRN
  Filled 2020-09-28: qty 10

## 2020-09-28 MED ORDER — KETOROLAC TROMETHAMINE 15 MG/ML IJ SOLN
15.0000 mg | Freq: Once | INTRAMUSCULAR | Status: AC
  Administered 2020-09-28: 15 mg via INTRAVENOUS

## 2020-09-28 MED ORDER — PALONOSETRON HCL INJECTION 0.25 MG/5ML
INTRAVENOUS | Status: AC
  Filled 2020-09-28: qty 5

## 2020-09-28 MED ORDER — ATROPINE SULFATE 1 MG/ML IJ SOLN
0.5000 mg | Freq: Once | INTRAMUSCULAR | Status: AC | PRN
  Administered 2020-09-28: 0.5 mg via INTRAVENOUS

## 2020-09-28 MED ORDER — FLUOROURACIL CHEMO INJECTION 2.5 GM/50ML
300.0000 mg/m2 | Freq: Once | INTRAVENOUS | Status: AC
  Administered 2020-09-28: 550 mg via INTRAVENOUS
  Filled 2020-09-28: qty 11

## 2020-09-28 NOTE — Patient Instructions (Signed)
[facility]  Discharge Instructions: Thank you for choosing [facility] to provide your oncology and hematology care.   If you have a lab appointment with the [facility], please go directly to the [facility] and check in at the registration area.  Wear comfortable clothing and clothing appropriate for easy access to any Portacath or PICC line.   We strive to give you quality time with your provider. You may need to reschedule your appointment if you arrive late (15 or more minutes).  Arriving late affects you and other patients whose appointments are after yours.  Also, if you miss three or more appointments without notifying the office, you may be dismissed from the clinic at the provider's discretion.      For prescription refill requests, have your pharmacy contact our office and allow 72 hours for refills to be completed.    Today you received the following chemotherapy and/or immunotherapy agents 5FU. Camptosar      To help prevent nausea and vomiting after your treatment, we encourage you to take your nausea medication as directed.  BELOW ARE SYMPTOMS THAT SHOULD BE REPORTED IMMEDIATELY: . *FEVER GREATER THAN 100.4 F (38 C) OR HIGHER . *CHILLS OR SWEATING . *NAUSEA AND VOMITING THAT IS NOT CONTROLLED WITH YOUR NAUSEA MEDICATION . *UNUSUAL SHORTNESS OF BREATH . *UNUSUAL BRUISING OR BLEEDING . *URINARY PROBLEMS (pain or burning when urinating, or frequent urination) . *BOWEL PROBLEMS (unusual diarrhea, constipation, pain near the anus) . TENDERNESS IN MOUTH AND THROAT WITH OR WITHOUT PRESENCE OF ULCERS (sore throat, sores in mouth, or a toothache) . UNUSUAL RASH, SWELLING OR PAIN  . UNUSUAL VAGINAL DISCHARGE OR ITCHING   Items with * indicate a potential emergency and should be followed up as soon as possible or go to the Emergency Department if any problems should occur.  Please show the CHEMOTHERAPY ALERT CARD or IMMUNOTHERAPY ALERT  CARD at check-in to the Emergency Department and triage nurse. Should you have questions after your visit or need to cancel or reschedule your appointment, please contact [facility]  [phone] and follow the prompts.  Office hours are 8:00 a.m. to 4:30 p.m. Monday - Friday. Please note that voicemails left after 4:00 p.m. may not be returned until the following business day.  We are closed weekends and major holidays. You have access to a nurse at all times for urgent questions. Please call the main number to the clinic [phone] and follow the prompts.  For any non-urgent questions, you may also contact your provider using MyChart. We now offer e-Visits for anyone 82 and older to request care online for non-urgent symptoms. For details visit [email or web address].   Also download the MyChart app! Go to the app store, search "MyChart", open the app, select [facility], and log in with your MyChart username and password.  Due to Covid, a mask is required upon entering the hospital/clinic. If you do not have a mask, one will be given to you upon arrival. For doctor visits, patients may have 1 support person aged 82 or older with them. For treatment visits, patients cannot have anyone with them due to current Covid guidelines and our immunocompromised population.

## 2020-09-28 NOTE — Progress Notes (Signed)
Okay to treat today despite elevated heart rate per Dr. Marin Olp.

## 2020-09-28 NOTE — Patient Instructions (Signed)
Implanted Port Insertion, Care After This sheet gives you information about how to care for yourself after your procedure. Your health care provider may also give you more specific instructions. If you have problems or questions, contact your health care provider. What can I expect after the procedure? After the procedure, it is common to have:  Discomfort at the port insertion site.  Bruising on the skin over the port. This should improve over 3-4 days. Follow these instructions at home: Port care  After your port is placed, you will get a manufacturer's information card. The card has information about your port. Keep this card with you at all times.  Take care of the port as told by your health care provider. Ask your health care provider if you or a family member can get training for taking care of the port at home. A home health care nurse may also take care of the port.  Make sure to remember what type of port you have. Incision care  Follow instructions from your health care provider about how to take care of your port insertion site. Make sure you: ? Wash your hands with soap and water before and after you change your bandage (dressing). If soap and water are not available, use hand sanitizer. ? Change your dressing as told by your health care provider. ? Leave stitches (sutures), skin glue, or adhesive strips in place. These skin closures may need to stay in place for 2 weeks or longer. If adhesive strip edges start to loosen and curl up, you may trim the loose edges. Do not remove adhesive strips completely unless your health care provider tells you to do that.  Check your port insertion site every day for signs of infection. Check for: ? Redness, swelling, or pain. ? Fluid or blood. ? Warmth. ? Pus or a bad smell.      Activity  Return to your normal activities as told by your health care provider. Ask your health care provider what activities are safe for you.  Do not  lift anything that is heavier than 10 lb (4.5 kg), or the limit that you are told, until your health care provider says that it is safe. General instructions  Take over-the-counter and prescription medicines only as told by your health care provider.  Do not take baths, swim, or use a hot tub until your health care provider approves. Ask your health care provider if you may take showers. You may only be allowed to take sponge baths.  Do not drive for 24 hours if you were given a sedative during your procedure.  Wear a medical alert bracelet in case of an emergency. This will tell any health care providers that you have a port.  Keep all follow-up visits as told by your health care provider. This is important. Contact a health care provider if:  You cannot flush your port with saline as directed, or you cannot draw blood from the port.  You have a fever or chills.  You have redness, swelling, or pain around your port insertion site.  You have fluid or blood coming from your port insertion site.  Your port insertion site feels warm to the touch.  You have pus or a bad smell coming from the port insertion site. Get help right away if:  You have chest pain or shortness of breath.  You have bleeding from your port that you cannot control. Summary  Take care of the port as told by your   health care provider. Keep the manufacturer's information card with you at all times.  Change your dressing as told by your health care provider.  Contact a health care provider if you have a fever or chills or if you have redness, swelling, or pain around your port insertion site.  Keep all follow-up visits as told by your health care provider. This information is not intended to replace advice given to you by your health care provider. Make sure you discuss any questions you have with your health care provider. Document Revised: 11/06/2017 Document Reviewed: 11/06/2017 Elsevier Patient Education   2021 Elsevier Inc.  

## 2020-09-28 NOTE — Progress Notes (Signed)
Hematology and Oncology Follow Up Visit  Tonya [name] [id number] Oct 10, 1940 80 y.o. 09/28/2020   Principle Diagnosis:  Stage IV adenocarcinoma of the ascending colon -- NRAS+, BRAF-, HER2-, MSI/MMR -,  Iron deficiency anemia DVT in LEFT leg-- Peroneal and popliteal vein  Past Therapy: FOLFOX - s/p cycle5 Xeloda with radiation therapy.  To started on 04/08/2020.  Patient will take 1500 mg of Xeloda daily. FOLFIRI/Bevacizumab -started 01/20/2019,s/p cycle #16 -- Avastin d/c'ed on 10/21/2019 due to DVT  Current Therapy: Lonsurf 60 mg po q day (5 on - 2 off - 5 on - 2 weeks off) - started 08/02/2020 -- d/c due to poor tolerance IV Iron as indicated Eliquis 5 mg po BID -- started on 09/30/2019 --DC on 04/01/2020 secondary to GI bleeding -- re-started on 05/03/2020 FOLFIRI -- s/p cycle #1 -- on 09/13/2020   Interim History:  [name] is here today with her daughter for follow-up.  Unfortunately, there is her daughter suffered a herniated disc in her back.  She is having some problems with this.  Unfortunately, she do not have surgery for this because she is focused on taking care of her mom.  I totally understand this.  Ms. Bantz seemed to do okay with the first cycle of treatment.  Her liver tests actually look a little bit better.  She did have a transfusion last week.  Hemoglobin was 8.1.  This did make her feel a little bit better.  She really still cannot do all that much.  She is at home.  She mostly is in bed.  She does get around a little bit.  There is pain issues.  Unfortunately, she has a difficult time with pain medications.  We are very limited as what we can try with her for pain.  She is still wants to try to be aggressive with respect to treatment.  She understands that her cancer can respond but it is hard to say how long the response will last.  Her CEA level today was 138.  This actually is not all that much higher.  Again, hopefully will be seen a  response.  Her last prealbumin was only 6.8.  Will be interesting to see what it is now.  Again I think this is certainly a ominous prognostic factor for her.  Overall, I would have to say that her performance status is probably ECOG 3.  Medications:  Allergies as of 09/28/2020      Reactions   Hydrocodone Swelling, Other (See Comments)   "I started swelling, became red, and passed out"   Iohexol Hives, Shortness Of Breath, Other (See Comments)   Patient developed hives and fullness in throat post injection of 125cc's Omni 300, Onset Date: 11/15/2006   Naproxen Other (See Comments)   "It made me feel out of my head"   Other Other (See Comments)   Ibuprofen Nausea Only   Propoxyphene N-acetaminophen Other (See Comments)   "I couldn't find the door to make my way out of the room- I was in misery"      Medication List       Accurate as of September 28, 2020  1:25 PM. If you have any questions, ask your nurse or doctor.        B-D SINGLE USE SWABS REGULAR Pads 1 each by Does not apply route as needed (to cleanse site prior to obtaining droplet of blood for CBG's). E11.9   B-D ULTRAFINE III SHORT PEN 31G X 8 MM Misc  Generic drug: Insulin Pen Needle   doxycycline 100 MG tablet Commonly known as: VIBRA-TABS Take 1 tablet (100 mg total) by mouth 2 (two) times daily.   Eliquis 5 MG Tabs tablet Generic drug: apixaban TAKE 1 TABLET(5 MG) BY MOUTH TWICE DAILY   famotidine 40 MG tablet Commonly known as: PEPCID TAKE 1 TABLET TWICE DAILY   ketoconazole 2 % cream Commonly known as: NIZORAL Apply 1 application topically 2 (two) times daily.   lidocaine-prilocaine cream Commonly known as: EMLA Apply 1 application topically as needed for up to 30 doses.   magic mouthwash Soln Swish and swallow equal parts Benadryl, Lidocaine and Nystatin.  5- 10 ml.'s four times a day as needed.   meclizine 12.5 MG tablet Commonly known as: ANTIVERT TAKE 1 TABLET BY MOUTH THREE TIMES DAILY AS NEEDED  FOR DIZZINESS   methylphenidate 10 MG tablet Commonly known as: Ritalin Take 1 tablet (10 mg total) by mouth 2 (two) times daily.   metoprolol succinate 25 MG 24 hr tablet Commonly known as: TOPROL-XL 25 mg daily.   prochlorperazine 10 MG tablet Commonly known as: COMPAZINE TAKE 1 TABLET(10 MG) BY MOUTH EVERY 6 HOURS AS NEEDED FOR NAUSEA OR VOMITING   spironolactone 25 MG tablet Commonly known as: ALDACTONE Take 0.5 tablets (12.5 mg total) by mouth daily.   terbinafine 1 % cream Commonly known as: LAMISIL Apply topically 2 (two) times daily. PRN   torsemide 20 MG tablet Commonly known as: DEMADEX Take 1 tablet (20 mg total) by mouth daily. With addition doses as needed.   traMADol 50 MG tablet Commonly known as: ULTRAM TAKE 1 TABLET(50 MG) BY MOUTH EVERY 6 HOURS AS NEEDED   Tresiba FlexTouch 100 UNIT/ML FlexTouch Pen Generic drug: insulin degludec Inject 0.21 mLs (21 Units total) into the skin daily. Take 34 units on chemo days What changed: how much to take   True Metrix Blood Glucose Test test strip Generic drug: glucose blood TEST BLOOD SUGAR TWICE DAILY   TRUEplus Lancets 30G Misc 1 each by Does not apply route 2 (two) times daily. E11.9   vitamin B-12 500 MCG tablet Commonly known as: CYANOCOBALAMIN Take 500 mcg by mouth daily.       Allergies:  Allergies  Allergen Reactions  . Hydrocodone Swelling and Other (See Comments)    "I started swelling, became red, and passed out"  . Iohexol Hives, Shortness Of Breath and Other (See Comments)    Patient developed hives and fullness in throat post injection of 125cc's Omni 300, Onset Date: 11/15/2006   . Naproxen Other (See Comments)    "It made me feel out of my head"  . Other Other (See Comments)  . Ibuprofen Nausea Only  . Propoxyphene N-Acetaminophen Other (See Comments)    "I couldn't find the door to make my way out of the room- I was in misery"    Past Medical History, Surgical history, Social  history, and Family History were reviewed and updated.  Review of Systems: Review of Systems  Constitutional: Positive for fever, malaise/fatigue and weight loss.  HENT: Negative.   Eyes: Negative.   Respiratory: Positive for shortness of breath.   Cardiovascular: Positive for leg swelling.  Gastrointestinal: Positive for abdominal pain and nausea.  Genitourinary: Negative.   Musculoskeletal: Positive for joint pain.  Skin: Negative.   Neurological: Negative.   Endo/Heme/Allergies: Negative.   Psychiatric/Behavioral: Negative.      Physical Exam:  oral temperature is 99 F (37.2 C). Her blood pressure is  110/74 and her pulse is 109 (abnormal). Her respiration is 18 and oxygen saturation is 100%.   Wt Readings from Last 3 Encounters:  07/12/20 73 kg  02/16/20 82.6 kg  01/29/20 85.2 kg    Physical Exam Vitals reviewed.  HENT:     Head: Normocephalic and atraumatic.  Eyes:     Pupils: Pupils are equal, round, and reactive to light.  Cardiovascular:     Rate and Rhythm: Normal rate and regular rhythm.     Heart sounds: Normal heart sounds.  Pulmonary:     Effort: Pulmonary effort is normal.     Breath sounds: Normal breath sounds.  Abdominal:     General: Bowel sounds are normal.     Palpations: Abdomen is soft.  Musculoskeletal:        General: No tenderness or deformity. Normal range of motion.     Cervical back: Normal range of motion.     Comments: Extremities shows a lot of edema in the legs.  There is probably some more edema in the right leg than the left leg.  She has decreased range of motion of her lower extremities.  Lymphadenopathy:     Cervical: No cervical adenopathy.  Skin:    General: Skin is warm and dry.     Findings: No erythema or rash.  Neurological:     Mental Status: She is alert and oriented to person, place, and time.  Psychiatric:        Behavior: Behavior normal.        Thought Content: Thought content normal.        Judgment: Judgment  normal.      Lab Results  Component Value Date   WBC 3.2 (L) 09/28/2020   HGB 10.3 (L) 09/28/2020   HCT 32.1 (L) 09/28/2020   MCV 90.4 09/28/2020   PLT 230 09/28/2020   Lab Results  Component Value Date   FERRITIN 1,130 (H) 09/13/2020   IRON 40 (L) 09/28/2020   TIBC 140 (L) 09/28/2020   UIBC 100 (L) 09/28/2020   IRONPCTSAT 28 09/28/2020   Lab Results  Component Value Date   RETICCTPCT 6.6 (H) 09/13/2020   RBC 3.55 (L) 09/28/2020   RETICCTABS 62,400 05/17/2016   No results found for: KPAFRELGTCHN, LAMBDASER, KAPLAMBRATIO No results found for: IGGSERUM, IGA, IGMSERUM No results found for: Odetta Pink, SPEI   Chemistry      Component Value Date/Time   NA 139 09/28/2020 1020   NA 139 09/30/2018 1311   K 3.3 (L) 09/28/2020 1020   CL 99 09/28/2020 1020   CO2 32 09/28/2020 1020   BUN 26 (H) 09/28/2020 1020   BUN 10 09/30/2018 1311   CREATININE 0.78 09/28/2020 1020      Component Value Date/Time   CALCIUM 9.5 09/28/2020 1020   ALKPHOS 337 (H) 09/28/2020 1020   AST 42 (H) 09/28/2020 1020   ALT 22 09/28/2020 1020   BILITOT 1.3 (H) 09/28/2020 1020       Impression and Plan: [name] is a very pleasant 80 yo African American female with metastatic colon cancer.  We now have her on FOLFIRI.  Hopefully she will tolerate this.  Hopefully will work.  Her liver tests are little bit better.  The alkaline phosphatase is up just a little bit.  Her iron studies look pretty good right now.  We just try to focus on her quality of life.  Maybe, treatment will help her quality of  life a little bit.  We will see back in her back in another couple weeks for her next cycle.  Again, we will have to see how she responds how well she tolerates treatment.  We are doing decreased dosing just because of her past treatments and her overall performance status.  I had a good prayer with her.  We will certainly help pray for her daughter  with her back issues.  Marland Kitchen   [name], MD 6/7/20221:25 PM

## 2020-09-28 NOTE — Progress Notes (Signed)
Reviewed all labwork with Dr Marin Olp.Madaline Brilliant to treat patient today

## 2020-09-28 NOTE — Progress Notes (Signed)
Patient very limited on what she can take for pain and is requesting pain med.  Toredol causes nausea per patient but we will try again this am.

## 2020-09-29 ENCOUNTER — Telehealth: Payer: Self-pay | Admitting: *Deleted

## 2020-09-29 ENCOUNTER — Other Ambulatory Visit: Payer: Self-pay | Admitting: *Deleted

## 2020-09-29 DIAGNOSIS — S82831D Other fracture of upper and lower end of right fibula, subsequent encounter for closed fracture with routine healing: Secondary | ICD-10-CM | POA: Diagnosis not present

## 2020-09-29 DIAGNOSIS — Z794 Long term (current) use of insulin: Secondary | ICD-10-CM | POA: Diagnosis not present

## 2020-09-29 DIAGNOSIS — C189 Malignant neoplasm of colon, unspecified: Secondary | ICD-10-CM | POA: Diagnosis not present

## 2020-09-29 DIAGNOSIS — C229 Malignant neoplasm of liver, not specified as primary or secondary: Secondary | ICD-10-CM | POA: Diagnosis not present

## 2020-09-29 DIAGNOSIS — Z7901 Long term (current) use of anticoagulants: Secondary | ICD-10-CM | POA: Diagnosis not present

## 2020-09-29 DIAGNOSIS — I4891 Unspecified atrial fibrillation: Secondary | ICD-10-CM | POA: Diagnosis not present

## 2020-09-29 DIAGNOSIS — E119 Type 2 diabetes mellitus without complications: Secondary | ICD-10-CM | POA: Diagnosis not present

## 2020-09-29 DIAGNOSIS — I10 Essential (primary) hypertension: Secondary | ICD-10-CM | POA: Diagnosis not present

## 2020-09-29 DIAGNOSIS — I69354 Hemiplegia and hemiparesis following cerebral infarction affecting left non-dominant side: Secondary | ICD-10-CM | POA: Diagnosis not present

## 2020-09-29 MED ORDER — ONDANSETRON HCL 8 MG PO TABS: 8.0000 mg | ORAL_TABLET | Freq: Three times a day (TID) | ORAL | 0 refills | Status: DC | PRN

## 2020-09-29 NOTE — Telephone Encounter (Signed)
Call received from patient's daughter [name] to see if pt will need iron infusion tomorrow.  Informed [name] that pt will not need iron infusion at this time.  [name] states that pt did have vomiting x1 this morning.  She would like to know if Dr Marin Olp would like for [facility] to restart Decadron after her chemo treatments. [name] notified that Dr. Marin Olp would like to add Zofran to patient's anti-emetics and to hold off on Decadron at this time d/t pt.'s history with elevated blood sugars after taking Decadron in the past.  [name] is appreciative of assistance and has no further questions at this time.

## 2020-09-30 ENCOUNTER — Other Ambulatory Visit: Payer: Self-pay

## 2020-09-30 ENCOUNTER — Inpatient Hospital Stay: Payer: Medicare HMO

## 2020-09-30 VITALS — BP 124/84 | HR 84 | Temp 99.0°F | Resp 18

## 2020-09-30 DIAGNOSIS — Z95828 Presence of other vascular implants and grafts: Secondary | ICD-10-CM

## 2020-09-30 DIAGNOSIS — D500 Iron deficiency anemia secondary to blood loss (chronic): Secondary | ICD-10-CM | POA: Diagnosis not present

## 2020-09-30 DIAGNOSIS — C182 Malignant neoplasm of ascending colon: Secondary | ICD-10-CM | POA: Diagnosis not present

## 2020-09-30 DIAGNOSIS — Z5111 Encounter for antineoplastic chemotherapy: Secondary | ICD-10-CM | POA: Diagnosis not present

## 2020-09-30 DIAGNOSIS — Z86718 Personal history of other venous thrombosis and embolism: Secondary | ICD-10-CM | POA: Diagnosis not present

## 2020-09-30 DIAGNOSIS — Z79899 Other long term (current) drug therapy: Secondary | ICD-10-CM | POA: Diagnosis not present

## 2020-09-30 DIAGNOSIS — Z7901 Long term (current) use of anticoagulants: Secondary | ICD-10-CM | POA: Diagnosis not present

## 2020-09-30 MED ORDER — SODIUM CHLORIDE 0.9% FLUSH
10.0000 mL | Freq: Once | INTRAVENOUS | Status: AC
  Administered 2020-09-30: 10 mL via INTRAVENOUS
  Filled 2020-09-30: qty 10

## 2020-09-30 MED ORDER — HEPARIN SOD (PORK) LOCK FLUSH 100 UNIT/ML IV SOLN
500.0000 [IU] | Freq: Once | INTRAVENOUS | Status: AC
  Administered 2020-09-30: 500 [IU] via INTRAVENOUS
  Filled 2020-09-30: qty 5

## 2020-09-30 NOTE — Patient Instructions (Signed)
Implanted Port Insertion, Care After This sheet gives you information about how to care for yourself after your procedure. Your health care provider may also give you more specific instructions. If you have problems or questions, contact your health care provider. What can I expect after the procedure? After the procedure, it is common to have:  Discomfort at the port insertion site.  Bruising on the skin over the port. This should improve over 3-4 days. Follow these instructions at home: Port care  After your port is placed, you will get a manufacturer's information card. The card has information about your port. Keep this card with you at all times.  Take care of the port as told by your health care provider. Ask your health care provider if you or a family member can get training for taking care of the port at home. A home health care nurse may also take care of the port.  Make sure to remember what type of port you have. Incision care  Follow instructions from your health care provider about how to take care of your port insertion site. Make sure you: ? Wash your hands with soap and water before and after you change your bandage (dressing). If soap and water are not available, use hand sanitizer. ? Change your dressing as told by your health care provider. ? Leave stitches (sutures), skin glue, or adhesive strips in place. These skin closures may need to stay in place for 2 weeks or longer. If adhesive strip edges start to loosen and curl up, you may trim the loose edges. Do not remove adhesive strips completely unless your health care provider tells you to do that.  Check your port insertion site every day for signs of infection. Check for: ? Redness, swelling, or pain. ? Fluid or blood. ? Warmth. ? Pus or a bad smell.      Activity  Return to your normal activities as told by your health care provider. Ask your health care provider what activities are safe for you.  Do not  lift anything that is heavier than 10 lb (4.5 kg), or the limit that you are told, until your health care provider says that it is safe. General instructions  Take over-the-counter and prescription medicines only as told by your health care provider.  Do not take baths, swim, or use a hot tub until your health care provider approves. Ask your health care provider if you may take showers. You may only be allowed to take sponge baths.  Do not drive for 24 hours if you were given a sedative during your procedure.  Wear a medical alert bracelet in case of an emergency. This will tell any health care providers that you have a port.  Keep all follow-up visits as told by your health care provider. This is important. Contact a health care provider if:  You cannot flush your port with saline as directed, or you cannot draw blood from the port.  You have a fever or chills.  You have redness, swelling, or pain around your port insertion site.  You have fluid or blood coming from your port insertion site.  Your port insertion site feels warm to the touch.  You have pus or a bad smell coming from the port insertion site. Get help right away if:  You have chest pain or shortness of breath.  You have bleeding from your port that you cannot control. Summary  Take care of the port as told by your   health care provider. Keep the manufacturer's information card with you at all times.  Change your dressing as told by your health care provider.  Contact a health care provider if you have a fever or chills or if you have redness, swelling, or pain around your port insertion site.  Keep all follow-up visits as told by your health care provider. This information is not intended to replace advice given to you by your health care provider. Make sure you discuss any questions you have with your health care provider. Document Revised: 11/06/2017 Document Reviewed: 11/06/2017 Elsevier Patient Education   2021 Elsevier Inc.  

## 2020-10-01 DIAGNOSIS — I69354 Hemiplegia and hemiparesis following cerebral infarction affecting left non-dominant side: Secondary | ICD-10-CM | POA: Diagnosis not present

## 2020-10-05 ENCOUNTER — Other Ambulatory Visit: Payer: Self-pay

## 2020-10-05 ENCOUNTER — Other Ambulatory Visit: Payer: Medicare HMO | Admitting: Nurse Practitioner

## 2020-10-05 VITALS — BP 136/80 | HR 66 | Resp 16

## 2020-10-05 DIAGNOSIS — E43 Unspecified severe protein-calorie malnutrition: Secondary | ICD-10-CM

## 2020-10-05 DIAGNOSIS — C189 Malignant neoplasm of colon, unspecified: Secondary | ICD-10-CM | POA: Diagnosis not present

## 2020-10-05 DIAGNOSIS — Z515 Encounter for palliative care: Secondary | ICD-10-CM | POA: Diagnosis not present

## 2020-10-05 DIAGNOSIS — E1169 Type 2 diabetes mellitus with other specified complication: Secondary | ICD-10-CM

## 2020-10-05 DIAGNOSIS — Z794 Long term (current) use of insulin: Secondary | ICD-10-CM | POA: Diagnosis not present

## 2020-10-05 NOTE — Progress Notes (Signed)
[facility] Consult Note Telephone: [phone]  Fax: [phone]    Date of encounter: 10/05/20 PATIENT NAME: Tonya [name] [place] [id number]   [phone] (home)  DOB: [birth year] MRN: [id number]  PRIMARY CARE PROVIDER:    Leeroy Cha, MD,  [place]. [address] [place] [address] [phone]  REFERRING PROVIDER:   Leeroy Cha, MD [address] [place],  [place] [address] [phone]  RESPONSIBLE PARTY:    Contact Information     Name Relation Home Work [place] Daughter   [phone]   [place] Spell Daughter [phone]  [phone]     I met face to face with patient in home. Daughter [name] present during visit.   Palliative Care was asked to follow this patient by consultation request of  Leeroy Cha,* to address advance care planning and complex medical decision making. This is the initial visit.                                  ASSESSMENT AND PLAN / RECOMMENDATIONS:   Advance Care Planning/Goals of Care: Goals include to maximize quality of life and symptom management.  Visit consisted of building trust and discussions on Palliative care medicine as specialized medical care for people living with serious illness, aimed at facilitating improved quality of life through symptoms relief, assisting with advance care planning and establishing goals of care. Patient and her daughter expressed appreciation for education provided on Palliative care and how it differs from Hospice service.  Our advance care planning conversation today included a discussion about:    The value and importance of advance care planning  Experiences with loved ones who have been seriously ill or have died  Exploration of personal, cultural or spiritual beliefs that might influence medical decisions  Exploration of goals of care in the event of a sudden  injury or illness  Review and updating or creation of an  advance directive document . Decision not to resuscitate  Goal of care: Patient's goal of care is function. Patient verbalized that her goal is to get better and return to her prior function. Of independence.  Directives: After discussions on ramifications and implications of code status, patient decided to not be resuscitated in the event of cardiac or respiratory arrest. DNR form signed for patient today, patient advised to keep form in a readily accessible area home, for easy assess if needed. Copy of DNR form uploaded to [facility] EMR. CODE STATUS: DNR  I spent 30 minutes providing this consultation. More than 50% of the time in this consultation was spent in counseling and care coordination. ------------------------------------------------------------------------------------  Symptom Management/Plan: Severe protein calorie malnutrition:  Prealbumin 5.8, BMI <18, generalized muscle wasting and atrophy. Encouraged to increase intake of nutritional supplement Glucerna to 2 cans a day between meal, or 3 cans if possible. Continue to offer meals form a variety of food groups, encourage snacking between meals. May consider appetite stimulant if poor oral intake persist. Diabetes: Patient on Tresiba, daughter administers different dose, administers 21 units every night as noted on Epic EMR and 34 units on chemo days. Report patient with one episode of hypoglycemia with CBG of 50, report only checking fasting blood sugars.This NP made a phone call to patient's PCP. Spoke with Ivin Booty CMA, was made aware that patient is supposed to be on a sliding scale,  with max dose of 14 units. Requested for a copy of the sliding scale be fax to me. Sliding scale dosage obtained via telephone, dose shared with patient's daughter. Dosage is as follows: CBG <100= no insulin 101-125= 2 units, 126-150= 4 units, 151-175= 6 units, 176-200= 8 units,201-225= 10  units, 226-250= 12 units, 251-300= 14 units.  Advised daughter to check blood sugar in the evenings before administering Tresiba. She verbalized understanding. Metastatic colon cancer: Tumor marker 138.72. Review of Oncology note showed likely poor prognosis. However,patient verbalized desire to continue treatment, expressing her faith in miraculous healing. Patient's daughter report patient's husband before his death was given a prognosis of 6 months but lived for another 10 years. Validation provided. Continue follow up with Dr. James Ivanoff.  Provided general support and encouragement. Questions and concerns were addressed. Patient and family was encouraged to call with questions and/or concerns. My business card was provided.   Follow up Palliative Care Visit: Palliative care will continue to follow for complex medical decision making, advance care planning, and clarification of goals. Return in about 4 weeks or prn.  PPS: 30%  HOSPICE ELIGIBILITY/DIAGNOSIS: yes/metastatic cancer. Patient and family verbalized unreadiness for Hospice care  Chief Complaint: poor oral intake and generalized weakness  History obtained from review of Epic EMR and discussion with Ms. Conde and her daughter [name].  HISTORY OF PRESENT ILLNESS:  Tonya [name] is a 80 y.o. year old female with  medical problem significant for stage 4 colon cancer diagnosed in 2020 (followed by Dr. Si Raider), Type 2 diabetes, DVT in left leg (on Eliquis). Patient with compliant of generalized weakness due to inadequate meal intake related to poor appetite. Patient currently receiving chemotherapy for her colon cancer and verbalized desire to continue pursuing treatment. She denied nausea today, denied difficulty swallowing. She received physical therapy once a week, family report she was receiving therapy twice a week but was reduced to once a week due to poor participation and progress. Patient is currently mostly in bed and requires full assist  with her ADLs.    I reviewed available labs, medications, imaging, studies and related documents from the EMR.  Records reviewed and summarized above.   ROS General: NAD EYES: denies acute vision changes ENMT: denies dysphagia Cardiovascular: denies chest pain, denies DOE Pulmonary: denies acute cough, denies increased SOB Abdomen: endorses poor appetite, denies constipation, endorses continence of bowel GU: denies dysuria, endorses incontinence of urine MSK:  endorsed weakness,  no falls reported Skin: denies rashes  Neurological: denies pain, denies insomnia Psych: Endorses positive mood Heme/lymph/immuno: denies bruises, abnormal bleeding  Physical Exam General: chronically ill and frail appearing, thin, lying in bed NAD  EYES: anicteric sclera, no discharge  ENMT: intact hearing, oral mucous membranes moist CV: S1S2 normal, +2 BLE edema, R>L Pulmonary: LCTA, no increased work of breathing, no cough, room air Abdomen: normo-active BS + 4 quadrants, soft and non tender, no ascites GU: deferred MSK: severe sarcopenia, non-ambulatory Skin: warm and dry, lesion noted on left lower lip Neuro:  generalized weakness, no cognitive impairment Psych: non-anxious affect, A and O x 3 Hem/lymph/immuno: no widespread bruising  CURRENT PROBLEM LIST:  Patient Active Problem List   Diagnosis Date Noted   Acute deep vein thrombosis (DVT) of left lower extremity (HCC) 05/12/2020   NSVT (nonsustained ventricular tachycardia) ([place]) 05/12/2020   Diabetes ([place]) 12/16/2018   Goals of care, counseling/discussion 11/01/2018   Colon cancer metastasized to liver (HCC)    Symptomatic anemia 10/15/2018   D-dimer,  elevated 10/11/2017   Leg edema, left 10/10/2017   Lesion of lip 10/10/2017   Vitamin D deficiency 07/17/2017   Spinal stenosis of lumbar region 01/23/2017   Frequent PVCs 11/23/2016   Heart murmur 09/19/2016   Iron deficiency anemia due to chronic blood loss 05/17/2016   Chronic right  hip pain 06/08/2015   Abnormal EKG 02/23/2015   Bradycardia 10/20/2014   Routine general medical examination at a health care facility 01/02/2013   Hypertonicity of bladder 12/20/2009   Hyperlipidemia with target LDL less than 100 12/14/2008   GERD 11/03/2008   Right lumbar radiculitis 05/18/2008   Essential hypertension 07/26/2007   COPD 07/26/2007   CVA, old, hemiparesis ([place]) 07/26/2007   PAST MEDICAL HISTORY:  Active Ambulatory Problems    Diagnosis Date Noted   Hyperlipidemia with target LDL less than 100 12/14/2008   Essential hypertension 07/26/2007   COPD 07/26/2007   GERD 11/03/2008   Hypertonicity of bladder 12/20/2009   Right lumbar radiculitis 05/18/2008   CVA, old, hemiparesis ([place]) 07/26/2007   Routine general medical examination at a health care facility 01/02/2013   Bradycardia 10/20/2014   Abnormal EKG 02/23/2015   Chronic right hip pain 06/08/2015   Iron deficiency anemia due to chronic blood loss 05/17/2016   Heart murmur 09/19/2016   Frequent PVCs 11/23/2016   Spinal stenosis of lumbar region 01/23/2017   Vitamin D deficiency 07/17/2017   Leg edema, left 10/10/2017   Lesion of lip 10/10/2017   D-dimer, elevated 10/11/2017   Symptomatic anemia 10/15/2018   Colon cancer metastasized to liver ([place])    Goals of care, counseling/discussion 11/01/2018   Diabetes ([place]) 12/16/2018   Acute deep vein thrombosis (DVT) of left lower extremity ([place]) 05/12/2020   NSVT (nonsustained ventricular tachycardia) ([place]) 05/12/2020   Resolved Ambulatory Problems    Diagnosis Date Noted   PERIPHERAL EDEMA 09/28/2008   HEART MURMUR, SYSTOLIC 16/83/7290   Foot pain, left 08/31/2010   Hyperglycemia 07/18/2011   Cough 07/02/2013   Flu-like symptoms 07/02/2013   Right foot pain 10/28/2013   Dizziness and giddiness 10/20/2014   DOE (dyspnea on exertion) 02/23/2015   Sciatica of right side 01/31/2016   SOB (shortness of breath) 09/19/2016   Past Medical History:   Diagnosis Date   Cerebrovascular accident [facility]) 2001   Chronic edema    Colon cancer ([place]) 2020   COPD (chronic obstructive pulmonary disease) ([place])    Diabetes mellitus without complication ([place])    Dyspnea    Enlarged heart    Gallstones    GERD (gastroesophageal reflux disease)    History of radiation therapy 04/08/2020-05/21/2020   Hyperlipidemia    Hypertension    PONV (postoperative nausea and vomiting)    SOCIAL HX:  Social History   Tobacco Use   Smoking status: Former    Packs/day: 0.25    Pack years: 0.00    Types: Cigarettes    Quit date: 07/23/1968    Years since quitting: 52.2   Smokeless tobacco: Never   Tobacco comments:    6 MONTH  Substance Use Topics   Alcohol use: No    Alcohol/week: 0.0 standard drinks   FAMILY HX:  Family History  Problem Relation Age of Onset   Cirrhosis Mother    Heart disease Father    Cancer Other    Hypertension Other    Diabetes Other      ALLERGIES:  Allergies  Allergen Reactions   Hydrocodone Swelling and Other (See Comments)    "I  started swelling, became red, and passed out"   Iohexol Hives, Shortness Of Breath and Other (See Comments)    Patient developed hives and fullness in throat post injection of 125cc's Omni 300, Onset Date: 11/15/2006    Naproxen Other (See Comments)    "It made me feel out of my head"   Other Other (See Comments)   Ibuprofen Nausea Only   Propoxyphene N-Acetaminophen Other (See Comments)    "I couldn't find the door to make my way out of the room- I was in misery"     Thank you for the opportunity to participate in the care of Ms. Deroche.  The palliative care team will continue to follow. Please call our office at [phone] if we can be of additional assistance.  Jari Favre, DNP, AGPCNP-BC  COVID-19 PATIENT SCREENING TOOL Asked and negative response unless otherwise noted:   Have you had symptoms of covid, tested positive or been in contact with someone with  symptoms/positive test in the past 5-10 days?

## 2020-10-06 DIAGNOSIS — I10 Essential (primary) hypertension: Secondary | ICD-10-CM | POA: Diagnosis not present

## 2020-10-06 DIAGNOSIS — C229 Malignant neoplasm of liver, not specified as primary or secondary: Secondary | ICD-10-CM | POA: Diagnosis not present

## 2020-10-06 DIAGNOSIS — S82831D Other fracture of upper and lower end of right fibula, subsequent encounter for closed fracture with routine healing: Secondary | ICD-10-CM | POA: Diagnosis not present

## 2020-10-06 DIAGNOSIS — C189 Malignant neoplasm of colon, unspecified: Secondary | ICD-10-CM | POA: Diagnosis not present

## 2020-10-06 DIAGNOSIS — E119 Type 2 diabetes mellitus without complications: Secondary | ICD-10-CM | POA: Diagnosis not present

## 2020-10-06 DIAGNOSIS — Z794 Long term (current) use of insulin: Secondary | ICD-10-CM | POA: Diagnosis not present

## 2020-10-06 DIAGNOSIS — I4891 Unspecified atrial fibrillation: Secondary | ICD-10-CM | POA: Diagnosis not present

## 2020-10-06 DIAGNOSIS — I69354 Hemiplegia and hemiparesis following cerebral infarction affecting left non-dominant side: Secondary | ICD-10-CM | POA: Diagnosis not present

## 2020-10-06 DIAGNOSIS — Z7901 Long term (current) use of anticoagulants: Secondary | ICD-10-CM | POA: Diagnosis not present

## 2020-10-07 ENCOUNTER — Telehealth: Payer: Self-pay

## 2020-10-07 ENCOUNTER — Telehealth: Payer: Self-pay | Admitting: *Deleted

## 2020-10-07 DIAGNOSIS — Z7901 Long term (current) use of anticoagulants: Secondary | ICD-10-CM | POA: Diagnosis not present

## 2020-10-07 DIAGNOSIS — S82831D Other fracture of upper and lower end of right fibula, subsequent encounter for closed fracture with routine healing: Secondary | ICD-10-CM | POA: Diagnosis not present

## 2020-10-07 DIAGNOSIS — I10 Essential (primary) hypertension: Secondary | ICD-10-CM | POA: Diagnosis not present

## 2020-10-07 DIAGNOSIS — C229 Malignant neoplasm of liver, not specified as primary or secondary: Secondary | ICD-10-CM | POA: Diagnosis not present

## 2020-10-07 DIAGNOSIS — E119 Type 2 diabetes mellitus without complications: Secondary | ICD-10-CM | POA: Diagnosis not present

## 2020-10-07 DIAGNOSIS — C189 Malignant neoplasm of colon, unspecified: Secondary | ICD-10-CM | POA: Diagnosis not present

## 2020-10-07 DIAGNOSIS — Z794 Long term (current) use of insulin: Secondary | ICD-10-CM | POA: Diagnosis not present

## 2020-10-07 DIAGNOSIS — I4891 Unspecified atrial fibrillation: Secondary | ICD-10-CM | POA: Diagnosis not present

## 2020-10-07 DIAGNOSIS — I69354 Hemiplegia and hemiparesis following cerebral infarction affecting left non-dominant side: Secondary | ICD-10-CM | POA: Diagnosis not present

## 2020-10-07 NOTE — Telephone Encounter (Signed)
Message received from patient's daughter, [name] requesting to move pt.'s treatments to every three weeks d/t pt has been in bed all week and is not eating much.  Dr. Marin Olp notified and order received to change pt.'s treatments to every three weeks per [name]'s request.  Message sent to scheduling.

## 2020-10-12 ENCOUNTER — Inpatient Hospital Stay: Payer: Medicare HMO

## 2020-10-12 ENCOUNTER — Telehealth: Payer: Self-pay | Admitting: *Deleted

## 2020-10-12 ENCOUNTER — Other Ambulatory Visit: Payer: Self-pay

## 2020-10-12 ENCOUNTER — Encounter: Payer: Self-pay | Admitting: Family

## 2020-10-12 ENCOUNTER — Inpatient Hospital Stay (HOSPITAL_BASED_OUTPATIENT_CLINIC_OR_DEPARTMENT_OTHER): Payer: Medicare HMO | Admitting: Family

## 2020-10-12 VITALS — BP 70/56 | HR 102 | Resp 14

## 2020-10-12 VITALS — BP 126/61 | HR 72 | Temp 98.5°F | Resp 18

## 2020-10-12 DIAGNOSIS — C182 Malignant neoplasm of ascending colon: Secondary | ICD-10-CM | POA: Diagnosis not present

## 2020-10-12 DIAGNOSIS — Z5111 Encounter for antineoplastic chemotherapy: Secondary | ICD-10-CM | POA: Diagnosis not present

## 2020-10-12 DIAGNOSIS — Z79899 Other long term (current) drug therapy: Secondary | ICD-10-CM | POA: Diagnosis not present

## 2020-10-12 DIAGNOSIS — D649 Anemia, unspecified: Secondary | ICD-10-CM

## 2020-10-12 DIAGNOSIS — C189 Malignant neoplasm of colon, unspecified: Secondary | ICD-10-CM | POA: Diagnosis not present

## 2020-10-12 DIAGNOSIS — E860 Dehydration: Secondary | ICD-10-CM

## 2020-10-12 DIAGNOSIS — C787 Secondary malignant neoplasm of liver and intrahepatic bile duct: Secondary | ICD-10-CM | POA: Diagnosis not present

## 2020-10-12 DIAGNOSIS — Z86718 Personal history of other venous thrombosis and embolism: Secondary | ICD-10-CM | POA: Diagnosis not present

## 2020-10-12 DIAGNOSIS — Z7901 Long term (current) use of anticoagulants: Secondary | ICD-10-CM | POA: Diagnosis not present

## 2020-10-12 DIAGNOSIS — D500 Iron deficiency anemia secondary to blood loss (chronic): Secondary | ICD-10-CM

## 2020-10-12 LAB — CBC WITH DIFFERENTIAL (CANCER CENTER ONLY)
Abs Immature Granulocytes: 0.05 10*3/uL (ref 0.00–0.07)
Basophils Absolute: 0 10*3/uL (ref 0.0–0.1)
Basophils Relative: 0 %
Eosinophils Absolute: 0 10*3/uL (ref 0.0–0.5)
Eosinophils Relative: 0 %
HCT: 25.5 % — ABNORMAL LOW (ref 36.0–46.0)
Hemoglobin: 8 g/dL — ABNORMAL LOW (ref 12.0–15.0)
Immature Granulocytes: 2 %
Lymphocytes Relative: 14 %
Lymphs Abs: 0.4 10*3/uL — ABNORMAL LOW (ref 0.7–4.0)
MCH: 29.2 pg (ref 26.0–34.0)
MCHC: 31.4 g/dL (ref 30.0–36.0)
MCV: 93.1 fL (ref 80.0–100.0)
Monocytes Absolute: 0.8 10*3/uL (ref 0.1–1.0)
Monocytes Relative: 29 %
Neutro Abs: 1.5 10*3/uL — ABNORMAL LOW (ref 1.7–7.7)
Neutrophils Relative %: 55 %
Platelet Count: 211 10*3/uL (ref 150–400)
RBC: 2.74 MIL/uL — ABNORMAL LOW (ref 3.87–5.11)
RDW: 17.3 % — ABNORMAL HIGH (ref 11.5–15.5)
WBC Count: 2.7 10*3/uL — ABNORMAL LOW (ref 4.0–10.5)
nRBC: 1.8 % — ABNORMAL HIGH (ref 0.0–0.2)

## 2020-10-12 LAB — CMP (CANCER CENTER ONLY)
ALT: 15 U/L (ref 0–44)
AST: 22 U/L (ref 15–41)
Albumin: 2.8 g/dL — ABNORMAL LOW (ref 3.5–5.0)
Alkaline Phosphatase: 309 U/L — ABNORMAL HIGH (ref 38–126)
Anion gap: 9 (ref 5–15)
BUN: 20 mg/dL (ref 8–23)
CO2: 34 mmol/L — ABNORMAL HIGH (ref 22–32)
Calcium: 9.3 mg/dL (ref 8.9–10.3)
Chloride: 96 mmol/L — ABNORMAL LOW (ref 98–111)
Creatinine: 0.98 mg/dL (ref 0.44–1.00)
GFR, Estimated: 58 mL/min — ABNORMAL LOW (ref >60)
Glucose, Bld: 164 mg/dL — ABNORMAL HIGH (ref 70–99)
Potassium: 3.5 mmol/L (ref 3.5–5.1)
Sodium: 139 mmol/L (ref 135–145)
Total Bilirubin: 1.4 mg/dL — ABNORMAL HIGH (ref 0.3–1.2)
Total Protein: 5.1 g/dL — ABNORMAL LOW (ref 6.5–8.1)

## 2020-10-12 LAB — SAMPLE TO BLOOD BANK

## 2020-10-12 LAB — PREALBUMIN: Prealbumin: 6.4 mg/dL — ABNORMAL LOW (ref 18–38)

## 2020-10-12 LAB — LACTATE DEHYDROGENASE: LDH: 290 U/L — ABNORMAL HIGH (ref 98–192)

## 2020-10-12 LAB — PREPARE RBC (CROSSMATCH)

## 2020-10-12 MED ORDER — DIPHENHYDRAMINE HCL 25 MG PO CAPS: 25.0000 mg | ORAL_CAPSULE | Freq: Once | ORAL | Status: DC

## 2020-10-12 MED ORDER — SODIUM CHLORIDE 0.9% FLUSH
10.0000 mL | INTRAVENOUS | Status: AC | PRN
  Administered 2020-10-12: 10 mL
  Filled 2020-10-12: qty 10

## 2020-10-12 MED ORDER — SODIUM CHLORIDE 0.9 % IV SOLN
1000.0000 mL | INTRAVENOUS | Status: AC
  Administered 2020-10-12: 1000 mL via INTRAVENOUS
  Filled 2020-10-12 (×2): qty 1000

## 2020-10-12 MED ORDER — SODIUM CHLORIDE 0.9% IV SOLUTION
250.0000 mL | Freq: Once | INTRAVENOUS | Status: DC
  Filled 2020-10-12: qty 250

## 2020-10-12 MED ORDER — HEPARIN SOD (PORK) LOCK FLUSH 100 UNIT/ML IV SOLN
250.0000 [IU] | INTRAVENOUS | Status: AC | PRN
  Administered 2020-10-12: 250 [IU]
  Filled 2020-10-12: qty 5

## 2020-10-12 MED ORDER — KETOROLAC TROMETHAMINE 15 MG/ML IJ SOLN
INTRAMUSCULAR | Status: AC
  Filled 2020-10-12: qty 1

## 2020-10-12 MED ORDER — ALTEPLASE 2 MG IJ SOLR
2.0000 mg | Freq: Once | INTRAMUSCULAR | Status: DC | PRN
  Filled 2020-10-12: qty 2

## 2020-10-12 MED ORDER — SODIUM CHLORIDE 0.9 % IV SOLN
200.0000 mg | Freq: Once | INTRAVENOUS | Status: AC
  Administered 2020-10-12: 200 mg via INTRAVENOUS
  Filled 2020-10-12: qty 200

## 2020-10-12 NOTE — Progress Notes (Signed)
Hematology and Oncology Follow Up Visit  Tonya QUIZHPI [id number] 03-23-1941 80 y.o. 10/12/2020   Principle Diagnosis:  Stage IV adenocarcinoma of the ascending colon -- NRAS+, BRAF-, HER2-, MSI/MMR -, Iron deficiency anemia DVT in LEFT leg--  Peroneal and popliteal vein   Past Therapy: FOLFOX - s/p cycle 5 Xeloda with radiation therapy.  To started on 04/08/2020.  Patient will take 1500 mg of Xeloda daily. FOLFIRI/Bevacizumab - started 01/20/2019, s/p cycle #16 -- Avastin d/c'ed on 10/21/2019 due to DVT   Current Therapy:        Lonsurf 60 mg po q day (5 on - 2 off - 5 on - 2 weeks off) - started 08/02/2020 -- d/c due to poor tolerance IV Iron as indicated Eliquis 5 mg po BID -- started on 09/30/2019 --DC on 04/01/2020 secondary to GI bleeding -- re-started on 05/03/2020 FOLFIRI -- s/p cycle 1 -- on 09/13/2020   Interim History:  Tonya [name] is here today with 2 of her daughters for follow-up and treatment.  She is weak, fatigued, dizzy and mildly SOB on arrival. BP 70/56, HR 102, O2 sat on RA 94% on second check.  Her daughter Tonya [name] states that the patient has been hallucinating at home and seeing people that are not there.  She is bedbound, unable to stand without 3-4 person assist.  No fever, chills, n/v, cough, rash, chest pain, palpitations or changes in bowel or bladder habits.  She has pain in her left hip.  She has chronic swelling in her lower extremities. No falls or syncope to report.  She has no appetite and is not eating. She hydrates when she can. Unable to weigh.   ECOG Performance Status: 4 - Bedbound  Medications:  Allergies as of 10/12/2020       Reactions   Hydrocodone Swelling, Other (See Comments)   "I started swelling, became red, and passed out"   Iohexol Hives, Shortness Of Breath, Other (See Comments)   Patient developed hives and fullness in throat post injection of 125cc's Omni 300, Onset Date: 11/15/2006   Naproxen Other (See Comments)   "It made  me feel out of my head"   Other Other (See Comments)   Ibuprofen Nausea Only   Propoxyphene N-acetaminophen Other (See Comments)   "I couldn't find the door to make my way out of the room- I was in misery"        Medication List        Accurate as of October 12, 2020  9:58 AM. If you have any questions, ask your nurse or doctor.          B-D SINGLE USE SWABS REGULAR Pads 1 each by Does not apply route as needed (to cleanse site prior to obtaining droplet of blood for CBG's). E11.9   B-D ULTRAFINE III SHORT PEN 31G X 8 MM Misc Generic drug: Insulin Pen Needle   doxycycline 100 MG tablet Commonly known as: VIBRA-TABS Take 1 tablet (100 mg total) by mouth 2 (two) times daily.   Eliquis 5 MG Tabs tablet Generic drug: apixaban TAKE 1 TABLET(5 MG) BY MOUTH TWICE DAILY   famotidine 40 MG tablet Commonly known as: PEPCID TAKE 1 TABLET TWICE DAILY   ketoconazole 2 % cream Commonly known as: NIZORAL Apply 1 application topically 2 (two) times daily.   lidocaine-prilocaine cream Commonly known as: EMLA Apply 1 application topically as needed for up to 30 doses.   magic mouthwash Soln Swish and swallow equal parts Benadryl, Lidocaine  and Nystatin.  5- 10 ml.'s four times a day as needed.   meclizine 12.5 MG tablet Commonly known as: ANTIVERT TAKE 1 TABLET BY MOUTH THREE TIMES DAILY AS NEEDED FOR DIZZINESS   methylphenidate 10 MG tablet Commonly known as: Ritalin Take 1 tablet (10 mg total) by mouth 2 (two) times daily.   metoprolol succinate 25 MG 24 hr tablet Commonly known as: TOPROL-XL 25 mg daily.   ondansetron 8 MG tablet Commonly known as: ZOFRAN Take 1 tablet (8 mg total) by mouth every 8 (eight) hours as needed for nausea or vomiting.   prochlorperazine 10 MG tablet Commonly known as: COMPAZINE TAKE 1 TABLET(10 MG) BY MOUTH EVERY 6 HOURS AS NEEDED FOR NAUSEA OR VOMITING   spironolactone 25 MG tablet Commonly known as: ALDACTONE Take 0.5 tablets (12.5 mg  total) by mouth daily.   terbinafine 1 % cream Commonly known as: LAMISIL Apply topically 2 (two) times daily. PRN   torsemide 20 MG tablet Commonly known as: DEMADEX Take 1 tablet (20 mg total) by mouth daily. With addition doses as needed.   traMADol 50 MG tablet Commonly known as: ULTRAM TAKE 1 TABLET(50 MG) BY MOUTH EVERY 6 HOURS AS NEEDED   Tresiba FlexTouch 100 UNIT/ML FlexTouch Pen Generic drug: insulin degludec Inject 0.21 mLs (21 Units total) into the skin daily. Take 34 units on chemo days What changed: how much to take   True Metrix Blood Glucose Test test strip Generic drug: glucose blood TEST BLOOD SUGAR TWICE DAILY   TRUEplus Lancets 30G Misc 1 each by Does not apply route 2 (two) times daily. E11.9   vitamin B-12 500 MCG tablet Commonly known as: CYANOCOBALAMIN Take 500 mcg by mouth daily.        Allergies:  Allergies  Allergen Reactions   Hydrocodone Swelling and Other (See Comments)    "I started swelling, became red, and passed out"   Iohexol Hives, Shortness Of Breath and Other (See Comments)    Patient developed hives and fullness in throat post injection of 125cc's Omni 300, Onset Date: 11/15/2006    Naproxen Other (See Comments)    "It made me feel out of my head"   Other Other (See Comments)   Ibuprofen Nausea Only   Propoxyphene N-Acetaminophen Other (See Comments)    "I couldn't find the door to make my way out of the room- I was in misery"    Past Medical History, Surgical history, Social history, and Family History were reviewed and updated.  Review of Systems: All other 10 point review of systems is negative.   Physical Exam:  blood pressure is 70/56 (abnormal) and her pulse is 102 (abnormal). Her respiration is 14 and oxygen saturation is 100%.   Wt Readings from Last 3 Encounters:  07/12/20 161 lb (73 kg)  02/16/20 182 lb (82.6 kg)  01/29/20 187 lb 12.8 oz (85.2 kg)    Ocular: Sclerae unicteric, pupils equal, round and  reactive to light Ear-nose-throat: Oropharynx clear, dentition fair Lymphatic: No cervical or supraclavicular adenopathy Lungs no rales or rhonchi, good excursion bilaterally Heart regular rate and rhythm, no murmur appreciated Abd soft, nontender, positive bowel sounds MSK no focal spinal tenderness, no joint edema Neuro: non-focal, well-oriented, appropriate affect Breasts: Deferred  Lab Results  Component Value Date   WBC 2.7 (L) 10/12/2020   HGB 8.0 (L) 10/12/2020   HCT 25.5 (L) 10/12/2020   MCV 93.1 10/12/2020   PLT 211 10/12/2020   Lab Results  Component Value Date  FERRITIN 2,031 (H) 09/28/2020   IRON 40 (L) 09/28/2020   TIBC 140 (L) 09/28/2020   UIBC 100 (L) 09/28/2020   IRONPCTSAT 28 09/28/2020   Lab Results  Component Value Date   RETICCTPCT 6.6 (H) 09/13/2020   RBC 2.74 (L) 10/12/2020   RETICCTABS 62,400 05/17/2016   No results found for: KPAFRELGTCHN, LAMBDASER, KAPLAMBRATIO No results found for: IGGSERUM, IGA, IGMSERUM No results found for: Odetta Pink, SPEI   Chemistry      Component Value Date/Time   NA 139 09/28/2020 1020   NA 139 09/30/2018 1311   K 3.3 (L) 09/28/2020 1020   CL 99 09/28/2020 1020   CO2 32 09/28/2020 1020   BUN 26 (H) 09/28/2020 1020   BUN 10 09/30/2018 1311   CREATININE 0.78 09/28/2020 1020      Component Value Date/Time   CALCIUM 9.5 09/28/2020 1020   ALKPHOS 337 (H) 09/28/2020 1020   AST 42 (H) 09/28/2020 1020   ALT 22 09/28/2020 1020   BILITOT 1.3 (H) 09/28/2020 1020       Impression and Plan: Ms. Blok is a very pleasant 80 yo African American female with metastatic colon cancer.  Unfortunately Ms. Mira continues to decline. Dr. Marin Olp was present for her visit and both the patient and her sweet family are agreeable to stopping treatment and bringing in hospice at this time.  We will get this set up today and continue to focus on her quality of life.  She will  get fluids, IV iron and 1 unit of blood today as she has developed antibodies.  She has really fought hard and we are honored to have been a part of her care team.  They can contact our office with any questions or concerns.   Laverna Peace, NP 6/21/20229:58 AM

## 2020-10-12 NOTE — Patient Instructions (Signed)
[employer] medicine (25th ed., pp. 1059-1068). [place], [place]: Elsevier.">  Anemia  Anemia is a condition in which there is not enough red blood cells or hemoglobin in the blood. Hemoglobin is a substance in red blood cells thatcarries oxygen. When you do not have enough red blood cells or hemoglobin (are anemic), your body cannot get enough oxygen and your organs may not work properly. Asa result, you may feel very tired or have other problems. What are the causes? Common causes of anemia include: Excessive bleeding. Anemia can be caused by excessive bleeding inside or outside the body, including bleeding from the intestines or from heavy menstrual periods in females. Poor nutrition. Long-lasting (chronic) kidney, thyroid, and liver disease. Bone marrow disorders, spleen problems, and blood disorders. Cancer and treatments for cancer. HIV (human immunodeficiency virus) and AIDS (acquired immunodeficiency syndrome). Infections, medicines, and autoimmune disorders that destroy red blood cells. What are the signs or symptoms? Symptoms of this condition include: Minor weakness. Dizziness. Headache, or difficulties concentrating and sleeping. Heartbeats that feel irregular or faster than normal (palpitations). Shortness of breath, especially with exercise. Pale skin, lips, and nails, or cold hands and feet. Indigestion and nausea. Symptoms may occur suddenly or develop slowly. If your anemia is mild, you maynot have symptoms. How is this diagnosed? This condition is diagnosed based on blood tests, your medical history, and a physical exam. In some cases, a test may be needed in which cells are removed from the soft tissue inside of a bone and looked at under a microscope (bone marrow biopsy). Your health care provider may also check your stool (feces) for blood and may do additional testing to look for the cause of yourbleeding. Other tests may include: Imaging tests, such as a CT scan or  MRI. A procedure to see inside your esophagus and stomach (endoscopy). A procedure to see inside your colon and rectum (colonoscopy). How is this treated? Treatment for this condition depends on the cause. If you continue to lose a lot of blood, you may need to be treated at a hospital. Treatment may include: Taking supplements of iron, vitamin B12, or folic acid. Taking a hormone medicine (erythropoietin) that can help to stimulate red blood cell growth. Having a blood transfusion. This may be needed if you lose a lot of blood. Making changes to your diet. Having surgery to remove your spleen. Follow these instructions at home: Take over-the-counter and prescription medicines only as told by your health care provider. Take supplements only as told by your health care provider. Follow any diet instructions that you were given by your health care provider. Keep all follow-up visits as told by your health care provider. This is important. Contact a health care provider if: You develop new bleeding anywhere in the body. Get help right away if: You are very weak. You are short of breath. You have pain in your abdomen or chest. You are dizzy or feel faint. You have trouble concentrating. You have bloody stools, black stools, or tarry stools. You vomit repeatedly or you vomit up blood. These symptoms may represent a serious problem that is an emergency. Do not wait to see if the symptoms will go away. Get medical help right away. Call your local emergency services (911 in the [place] Do not drive yourself to the hospital. Summary Anemia is a condition in which you do not have enough red blood cells or enough of a substance in your red blood cells that carries oxygen (hemoglobin). Symptoms may occur suddenly   or develop slowly. If your anemia is mild, you may not have symptoms. This condition is diagnosed with blood tests, a medical history, and a physical exam. Other tests may be  needed. Treatment for this condition depends on the cause of the anemia. This information is not intended to replace advice given to you by your health care provider. Make sure you discuss any questions you have with your healthcare provider. Document Revised: 03/18/2019 Document Reviewed: 03/18/2019 Elsevier Patient Education  2022 [employer].

## 2020-10-12 NOTE — Patient Instructions (Signed)
Implanted Port Home Guide An implanted port is a device that is placed under the skin. It is usually placed in the chest. The device can be used to give IV medicine, to take blood, or for dialysis. You may have an implanted port if: You need IV medicine that would be irritating to the small veins in your hands or arms. You need IV medicines, such as antibiotics, for a long period of time. You need IV nutrition for a long period of time. You need dialysis. When you have a port, your health care provider can choose to use the port instead of veins in your arms for these procedures. You may have fewer limitations when using a port than you would if you used other types of long-term IVs, and you will likely be able to return to normal activities afteryour incision heals. An implanted port has two main parts: Reservoir. The reservoir is the part where a needle is inserted to give medicines or draw blood. The reservoir is round. After it is placed, it appears as a small, raised area under your skin. Catheter. The catheter is a thin, flexible tube that connects the reservoir to a vein. Medicine that is inserted into the reservoir goes into the catheter and then into the vein. How is my port accessed? To access your port: A numbing cream may be placed on the skin over the port site. Your health care provider will put on a mask and sterile gloves. The skin over your port will be cleaned carefully with a germ-killing soap and allowed to dry. Your health care provider will gently pinch the port and insert a needle into it. Your health care provider will check for a blood return to make sure the port is in the vein and is not clogged. If your port needs to remain accessed to get medicine continuously (constant infusion), your health care provider will place a clear bandage (dressing) over the needle site. The dressing and needle will need to be changed every week, or as told by your health care provider. What  is flushing? Flushing helps keep the port from getting clogged. Follow instructions from your health care provider about how and when to flush the port. Ports are usually flushed with saline solution or a medicine called heparin. The need for flushing will depend on how the port is used: If the port is only used from time to time to give medicines or draw blood, the port may need to be flushed: Before and after medicines have been given. Before and after blood has been drawn. As part of routine maintenance. Flushing may be recommended every 4-6 weeks. If a constant infusion is running, the port may not need to be flushed. Throw away any syringes in a disposal container that is meant for sharp items (sharps container). You can buy a sharps container from a pharmacy, or you can make one by using an empty hard plastic bottle with a cover. How long will my port stay implanted? The port can stay in for as long as your health care provider thinks it is needed. When it is time for the port to come out, a surgery will be done to remove it. The surgery will be similar to the procedure that was done to putthe port in. Follow these instructions at home:  Flush your port as told by your health care provider. If you need an infusion over several days, follow instructions from your health care provider about how to take   care of your port site. Make sure you: Wash your hands with soap and water before you change your dressing. If soap and water are not available, use alcohol-based hand sanitizer. Change your dressing as told by your health care provider. Place any used dressings or infusion bags into a plastic bag. Throw that bag in the trash. Keep the dressing that covers the needle clean and dry. Do not get it wet. Do not use scissors or sharp objects near the tube. Keep the tube clamped, unless it is being used. Check your port site every day for signs of infection. Check for: Redness, swelling, or  pain. Fluid or blood. Pus or a bad smell. Protect the skin around the port site. Avoid wearing bra straps that rub or irritate the site. Protect the skin around your port from seat belts. Place a soft pad over your chest if needed. Bathe or shower as told by your health care provider. The site may get wet as long as you are not actively receiving an infusion. Return to your normal activities as told by your health care provider. Ask your health care provider what activities are safe for you. Carry a medical alert card or wear a medical alert bracelet at all times. This will let health care providers know that you have an implanted port in case of an emergency. Get help right away if: You have redness, swelling, or pain at the port site. You have fluid or blood coming from your port site. You have pus or a bad smell coming from the port site. You have a fever. Summary Implanted ports are usually placed in the chest for long-term IV access. Follow instructions from your health care provider about flushing the port and changing bandages (dressings). Take care of the area around your port by avoiding clothing that puts pressure on the area, and by watching for signs of infection. Protect the skin around your port from seat belts. Place a soft pad over your chest if needed. Get help right away if you have a fever or you have redness, swelling, pain, drainage, or a bad smell at the port site. This information is not intended to replace advice given to you by your health care provider. Make sure you discuss any questions you have with your healthcare provider. Document Revised: 08/25/2019 Document Reviewed: 08/25/2019 Elsevier Patient Education  2022 [employer] Inc[employer]  

## 2020-10-12 NOTE — Telephone Encounter (Signed)
Per 10/12/20 [facility] - hospice

## 2020-10-12 NOTE — Telephone Encounter (Signed)
Hospice referral placed per order of Dr. Marin Olp to [facility] at [facility].

## 2020-10-13 DIAGNOSIS — I4891 Unspecified atrial fibrillation: Secondary | ICD-10-CM | POA: Diagnosis not present

## 2020-10-13 DIAGNOSIS — Z7901 Long term (current) use of anticoagulants: Secondary | ICD-10-CM | POA: Diagnosis not present

## 2020-10-13 DIAGNOSIS — C189 Malignant neoplasm of colon, unspecified: Secondary | ICD-10-CM | POA: Diagnosis not present

## 2020-10-13 DIAGNOSIS — Z794 Long term (current) use of insulin: Secondary | ICD-10-CM | POA: Diagnosis not present

## 2020-10-13 DIAGNOSIS — E119 Type 2 diabetes mellitus without complications: Secondary | ICD-10-CM | POA: Diagnosis not present

## 2020-10-13 DIAGNOSIS — I69354 Hemiplegia and hemiparesis following cerebral infarction affecting left non-dominant side: Secondary | ICD-10-CM | POA: Diagnosis not present

## 2020-10-13 DIAGNOSIS — C229 Malignant neoplasm of liver, not specified as primary or secondary: Secondary | ICD-10-CM | POA: Diagnosis not present

## 2020-10-13 DIAGNOSIS — S82831D Other fracture of upper and lower end of right fibula, subsequent encounter for closed fracture with routine healing: Secondary | ICD-10-CM | POA: Diagnosis not present

## 2020-10-13 DIAGNOSIS — I10 Essential (primary) hypertension: Secondary | ICD-10-CM | POA: Diagnosis not present

## 2020-10-13 LAB — TYPE AND SCREEN
ABO/RH(D): A POS
Antibody Screen: POSITIVE
Donor AG Type: NEGATIVE
Unit division: 0

## 2020-10-13 LAB — BPAM RBC
Blood Product Expiration Date: 202207082359
ISSUE DATE / TIME: 202206211231
Unit Type and Rh: 6200

## 2020-10-13 LAB — IRON AND TIBC
Iron: 37 ug/dL — ABNORMAL LOW (ref 41–142)
Saturation Ratios: 26 % (ref 21–57)
TIBC: 141 ug/dL — ABNORMAL LOW (ref 236–444)
UIBC: 104 ug/dL — ABNORMAL LOW (ref 120–384)

## 2020-10-13 LAB — CEA (IN HOUSE-CHCC): CEA (CHCC-In House): 93.98 ng/mL — ABNORMAL HIGH (ref 0.00–5.00)

## 2020-10-13 LAB — FERRITIN: Ferritin: 1453 ng/mL — ABNORMAL HIGH (ref 11–307)

## 2020-10-14 ENCOUNTER — Telehealth: Payer: Self-pay

## 2020-10-14 ENCOUNTER — Inpatient Hospital Stay: Payer: Medicare HMO

## 2020-10-14 DIAGNOSIS — Z794 Long term (current) use of insulin: Secondary | ICD-10-CM | POA: Diagnosis not present

## 2020-10-14 DIAGNOSIS — S82831D Other fracture of upper and lower end of right fibula, subsequent encounter for closed fracture with routine healing: Secondary | ICD-10-CM | POA: Diagnosis not present

## 2020-10-14 DIAGNOSIS — Z7901 Long term (current) use of anticoagulants: Secondary | ICD-10-CM | POA: Diagnosis not present

## 2020-10-14 DIAGNOSIS — I69354 Hemiplegia and hemiparesis following cerebral infarction affecting left non-dominant side: Secondary | ICD-10-CM | POA: Diagnosis not present

## 2020-10-14 DIAGNOSIS — I10 Essential (primary) hypertension: Secondary | ICD-10-CM | POA: Diagnosis not present

## 2020-10-14 DIAGNOSIS — C189 Malignant neoplasm of colon, unspecified: Secondary | ICD-10-CM | POA: Diagnosis not present

## 2020-10-14 DIAGNOSIS — C229 Malignant neoplasm of liver, not specified as primary or secondary: Secondary | ICD-10-CM | POA: Diagnosis not present

## 2020-10-14 DIAGNOSIS — E119 Type 2 diabetes mellitus without complications: Secondary | ICD-10-CM | POA: Diagnosis not present

## 2020-10-14 DIAGNOSIS — I4891 Unspecified atrial fibrillation: Secondary | ICD-10-CM | POA: Diagnosis not present

## 2020-10-14 NOTE — Telephone Encounter (Signed)
Patient daughter [name] called and left a message about her mother(the patient having some issues.) I tried calling her back to get more information, NA, LMAM.   In the message, she was requesting you give her a call.

## 2020-10-14 NOTE — Telephone Encounter (Signed)
Try tomorrow

## 2020-10-15 ENCOUNTER — Other Ambulatory Visit: Payer: Self-pay | Admitting: Hematology & Oncology

## 2020-10-15 ENCOUNTER — Telehealth: Payer: Self-pay | Admitting: *Deleted

## 2020-10-15 NOTE — Telephone Encounter (Signed)
I called and spoke with daughter [name] today, she is requesting you give her a call back in regards to her wanting to get some tests or more so a PET scan done for her mom She has many questions and is wanting only you to call her back. Thank you.  Deborah : [phone]

## 2020-10-15 NOTE — Telephone Encounter (Signed)
Call received from patient's daughter, Meredith Mody wanting to know if Dr. Marin Olp would order a PET scan before the family makes a decision regarding hospice.  Dr. Marin Olp notified.  Call placed back to [facility] and [facility] notified per Dr. Marin Olp that he will NOT order a PET scan at this time for patient. Meredith Mody is appreciative of call back and has no further questions at this time.

## 2020-10-20 NOTE — Progress Notes (Signed)
Primary Physician/Referring:  Leeroy Cha, MD  Patient ID: Tonya [name], female    DOB: [birth year], 80 y.o.   MRN: [id number]  Chief Complaint  Patient presents with   Hypertension   Follow-up    HPI: Tonya [name]  is a 80 y.o. female with Hypertension, hyperlipidemia, uncontrolled diabetes mellitus, history of stroke involving the right pons in 2001 with moderate diffuse atherosclerotic changes in the intracerebral vessels when she presented with left-sided weakness and slurred speech, lumbosacral spondylosi, nonsmoker, h/o GI bleed on 10/18/2018 and anemia due to descending colonic cancer with liver metastasis and presently on chemotherapy and h/o DVT in July 2021 and now on Eliquis.  Patient was seen in our office 09/09/2020 with concerns of blood pressure management and shortness of breath.  At that time blood pressure was well controlled and patient's alertness of breath was related to volume overload secondary to malignant metastases to her liver.  There is no evidence of acute heart failure, but rather ascites.  Therefore switch patient from furosemide to torsemide 20 mg daily with additional doses as needed and added spironolactone 12.5 mg daily, repeat BMP remained relatively stable.  Patient was advised at last visit to follow-up as needed, particularly given poor prognosis and extensive discussion regarding recommendation of palliative care consult. Since last visit patient has had meeting with hematology oncology team and shared decision was to discontinue treatment and transition patient to hospice care.  Patient and her daughter now present for concerns of fatigue and episode of hypotension on 10/12/2020.  Patient's daughter is also requesting repeat PET scan to further evaluate patient's cancer diagnosis, Dr. Sindy Messing has refused repeat PET scan.  Primary focus of today's visit is to discuss goals of care.  Past Medical History:  Diagnosis Date   Cerebrovascular accident  [facility]) 2001   left side weakness   Chronic edema    ? venous insufficiency   Colon cancer ([place]) 2020   COPD (chronic obstructive pulmonary disease) ([place])    never had a problem breathing   Diabetes mellitus without complication ([place])    Dyspnea    Enlarged heart    per pt   Gallstones    GERD (gastroesophageal reflux disease)    Goals of care, counseling/discussion 11/01/2018   History of radiation therapy 04/08/2020-05/21/2020   Colon;Dr. Gery Pray   Hyperlipidemia    Hypertension    PONV (postoperative nausea and vomiting)    nausea     Past Surgical History:  Procedure Laterality Date   ABDOMINAL HYSTERECTOMY     BIOPSY  10/18/2018   Procedure: BIOPSY;  Surgeon: Jackquline Denmark, MD;  Location: [facility];  Service: Endoscopy;;   CHOLECYSTECTOMY     COLONOSCOPY WITH PROPOFOL N/A 10/18/2018   Procedure: COLONOSCOPY WITH PROPOFOL;  Surgeon: Jackquline Denmark, MD;  Location: [facility];  Service: Endoscopy;  Laterality: N/A;   IR IMAGING GUIDED PORT INSERTION  11/11/2018   IR IVC FILTER PLMT / S&I /IMG GUID/MOD SED  02/11/2020   POLYPECTOMY  10/18/2018   Procedure: POLYPECTOMY;  Surgeon: Jackquline Denmark, MD;  Location: [facility] ENDOSCOPY;  Service: Endoscopy;;   SUBMUCOSAL TATTOO INJECTION  10/18/2018   Procedure: SUBMUCOSAL TATTOO INJECTION;  Surgeon: Jackquline Denmark, MD;  Location: [facility] ENDOSCOPY;  Service: Endoscopy;;   Family History  Problem Relation Age of Onset   Cirrhosis Mother    Heart disease Father    Cancer Other    Hypertension Other    Diabetes Other    Social History  Tobacco Use   Smoking status: Former    Packs/day: 0.25    Pack years: 0.00    Types: Cigarettes    Quit date: 07/23/1968    Years since quitting: 52.2   Smokeless tobacco: Never   Tobacco comments:    6 MONTH  Substance Use Topics   Alcohol use: No    Alcohol/week: 0.0 standard drinks   Marital Status: Widowed   ROS   Review of Systems  Cardiovascular:  Positive for dyspnea on exertion, leg  swelling and orthopnea. Negative for chest pain and palpitations.  Musculoskeletal:  Positive for arthritis, back pain and joint pain.  Gastrointestinal:  Negative for melena.  Neurological:  Positive for focal weakness (left hemiparesis).  Objective  Blood pressure 108/78, pulse (!) 103, resp. rate 16, height 5\' 11"  (1.803 m), weight 161 lb (73 kg), SpO2 98 %. Body mass index is 22.45 kg/m.  Vitals with BMI 10/21/2020 10/12/2020 10/12/2020  Height 5\' 11"  - -  Weight 161 lbs - -  BMI 22.63 - -  Systolic 335 456 256  Diastolic 78 61 64  Pulse 389 72 70    Physical Exam Constitutional:      Comments: Moderately built  Neck:     Thyroid: No thyromegaly.     Vascular: No JVD.  Cardiovascular:     Rate and Rhythm: Normal rate and regular rhythm. Occasional Extrasystoles are present.    Pulses: Intact distal pulses.     Heart sounds: Murmur heard.  Blowing midsystolic murmur is present with a grade of 3/6 at the apex.    No gallop.  Pulmonary:     Effort: Pulmonary effort is normal.     Breath sounds: Normal breath sounds.  Abdominal:     General: Bowel sounds are normal. There is distension.     Palpations: Abdomen is soft.     Tenderness: There is abdominal tenderness.     Comments: Ascites noted.   Musculoskeletal:     Right lower leg: Edema (1+pitting) present.     Left lower leg: Edema (2+ pitting) present.  Neurological:     Comments: Grossly left arm weakness and mild contracture noted, left lower extremity weakness present. (left hemiparesis).   Laboratory examination:    CMP Latest Ref Rng & Units 10/12/2020 09/28/2020 09/13/2020  Glucose 70 - 99 mg/dL 164(H) 225(H) 144(H)  BUN 8 - 23 mg/dL 20 26(H) 15  Creatinine 0.44 - 1.00 mg/dL 0.98 0.78 0.81  Sodium 135 - 145 mmol/L 139 139 139  Potassium 3.5 - 5.1 mmol/L 3.5 3.3(L) 3.0(L)  Chloride 98 - 111 mmol/L 96(L) 99 102  CO2 22 - 32 mmol/L 34(H) 32 29  Calcium 8.9 - 10.3 mg/dL 9.3 9.5 9.1  Total Protein 6.5 - 8.1 g/dL  5.1(L) 5.2(L) 5.1(L)  Total Bilirubin 0.3 - 1.2 mg/dL 1.4(H) 1.3(H) 0.9  Alkaline Phos 38 - 126 U/L 309(H) 337(H) 298(H)  AST 15 - 41 U/L 22 42(H) 69(H)  ALT 0 - 44 U/L 15 22 16    CBC Latest Ref Rng & Units 10/12/2020 09/28/2020 09/13/2020  WBC 4.0 - 10.5 K/uL 2.7(L) 3.2(L) 6.4  Hemoglobin 12.0 - 15.0 g/dL 8.0(L) 10.3(L) 8.1(L)  Hematocrit 36.0 - 46.0 % 25.5(L) 32.1(L) 25.4(L)  Platelets 150 - 400 K/uL 211 230 236   Lipid Panel     Component Value Date/Time   CHOL 137 03/30/2018 1201   TRIG 80 03/30/2018 1201   HDL 49 03/30/2018 1201   CHOLHDL 2.8 03/30/2018 1201  CHOLHDL 3 07/17/2017 1227   VLDL 21.6 07/17/2017 1227   LDLCALC 72 03/30/2018 1201   HEMOGLOBIN A1C Lab Results  Component Value Date   HGBA1C 7.6 (A) 09/19/2019   TSH No results for input(s): TSH in the last 8760 hours. Allergies   Allergies  Allergen Reactions   Hydrocodone Swelling and Other (See Comments)    "I started swelling, became red, and passed out"   Iohexol Hives, Shortness Of Breath and Other (See Comments)    Patient developed hives and fullness in throat post injection of 125cc's Omni 300, Onset Date: 11/15/2006    Naproxen Other (See Comments)    "It made me feel out of my head"   Other Other (See Comments)   Ibuprofen Nausea Only   Propoxyphene N-Acetaminophen Other (See Comments)    "I couldn't find the door to make my way out of the room- I was in misery"      Medications Prior to Visit:   Outpatient Medications Prior to Visit  Medication Sig Dispense Refill   Alcohol Swabs (B-D SINGLE USE SWABS REGULAR) PADS 1 each by Does not apply route as needed (to cleanse site prior to obtaining droplet of blood for CBG's). E11.9 100 each 2   B-D ULTRAFINE III SHORT PEN 31G X 8 MM MISC      ELIQUIS 5 MG TABS tablet TAKE 1 TABLET(5 MG) BY MOUTH TWICE DAILY 60 tablet 1   famotidine (PEPCID) 40 MG tablet TAKE 1 TABLET TWICE DAILY 180 tablet 3   insulin degludec (TRESIBA FLEXTOUCH) 100 UNIT/ML  FlexTouch Pen Inject 0.21 mLs (21 Units total) into the skin daily. Take 34 units on chemo days (Patient taking differently: Inject 12 Units into the skin daily. Take 34 units on chemo days) 10 pen 11   ketoconazole (NIZORAL) 2 % cream Apply 1 application topically 2 (two) times daily. 60 g 2   lidocaine-prilocaine (EMLA) cream Apply 1 application topically as needed for up to 30 doses. 30 g 1   magic mouthwash SOLN Swish and swallow equal parts Benadryl, Lidocaine and Nystatin.  5- 10 ml.'s four times a day as needed. (Patient taking differently: Swish and swallow equal parts Benadryl, Lidocaine and Nystatin.  5- 10 ml.'s four times a day as needed.) 240 mL 0   meclizine (ANTIVERT) 12.5 MG tablet TAKE 1 TABLET BY MOUTH THREE TIMES DAILY AS NEEDED FOR DIZZINESS 30 tablet 2   metoprolol succinate (TOPROL-XL) 25 MG 24 hr tablet 25 mg daily.     ondansetron (ZOFRAN) 8 MG tablet TAKE 1 TABLET(8 MG) BY MOUTH EVERY 8 HOURS AS NEEDED FOR NAUSEA OR VOMITING 20 tablet 0   prochlorperazine (COMPAZINE) 10 MG tablet TAKE 1 TABLET(10 MG) BY MOUTH EVERY 6 HOURS AS NEEDED FOR NAUSEA OR VOMITING 30 tablet 1   terbinafine (LAMISIL) 1 % cream Apply topically 2 (two) times daily. PRN     torsemide (DEMADEX) 20 MG tablet Take 1 tablet (20 mg total) by mouth daily. With addition doses as needed. 30 tablet 3   traMADol (ULTRAM) 50 MG tablet TAKE 1 TABLET(50 MG) BY MOUTH EVERY 6 HOURS AS NEEDED 90 tablet 0   TRUE METRIX BLOOD GLUCOSE TEST test strip TEST BLOOD SUGAR TWICE DAILY 200 strip 1   TRUEplus Lancets 30G MISC 1 each by Does not apply route 2 (two) times daily. E11.9 180 each 0   vitamin B-12 (CYANOCOBALAMIN) 500 MCG tablet Take 500 mcg by mouth daily.     spironolactone (ALDACTONE) 25 MG  tablet Take 0.5 tablets (12.5 mg total) by mouth daily. 15 tablet 3   doxycycline (VIBRA-TABS) 100 MG tablet Take 1 tablet (100 mg total) by mouth 2 (two) times daily. 20 tablet 0   methylphenidate (RITALIN) 10 MG tablet Take 1  tablet (10 mg total) by mouth 2 (two) times daily. 60 tablet 0   No facility-administered medications prior to visit.     Final Medications at End of Visit    Current Meds  Medication Sig   Alcohol Swabs (B-D SINGLE USE SWABS REGULAR) PADS 1 each by Does not apply route as needed (to cleanse site prior to obtaining droplet of blood for CBG's). E11.9   B-D ULTRAFINE III SHORT PEN 31G X 8 MM MISC    ELIQUIS 5 MG TABS tablet TAKE 1 TABLET(5 MG) BY MOUTH TWICE DAILY   famotidine (PEPCID) 40 MG tablet TAKE 1 TABLET TWICE DAILY   insulin degludec (TRESIBA FLEXTOUCH) 100 UNIT/ML FlexTouch Pen Inject 0.21 mLs (21 Units total) into the skin daily. Take 34 units on chemo days (Patient taking differently: Inject 12 Units into the skin daily. Take 34 units on chemo days)   ketoconazole (NIZORAL) 2 % cream Apply 1 application topically 2 (two) times daily.   lidocaine-prilocaine (EMLA) cream Apply 1 application topically as needed for up to 30 doses.   magic mouthwash SOLN Swish and swallow equal parts Benadryl, Lidocaine and Nystatin.  5- 10 ml.'s four times a day as needed. (Patient taking differently: Swish and swallow equal parts Benadryl, Lidocaine and Nystatin.  5- 10 ml.'s four times a day as needed.)   meclizine (ANTIVERT) 12.5 MG tablet TAKE 1 TABLET BY MOUTH THREE TIMES DAILY AS NEEDED FOR DIZZINESS   metoprolol succinate (TOPROL-XL) 25 MG 24 hr tablet 25 mg daily.   ondansetron (ZOFRAN) 8 MG tablet TAKE 1 TABLET(8 MG) BY MOUTH EVERY 8 HOURS AS NEEDED FOR NAUSEA OR VOMITING   prochlorperazine (COMPAZINE) 10 MG tablet TAKE 1 TABLET(10 MG) BY MOUTH EVERY 6 HOURS AS NEEDED FOR NAUSEA OR VOMITING   terbinafine (LAMISIL) 1 % cream Apply topically 2 (two) times daily. PRN   torsemide (DEMADEX) 20 MG tablet Take 1 tablet (20 mg total) by mouth daily. With addition doses as needed.   traMADol (ULTRAM) 50 MG tablet TAKE 1 TABLET(50 MG) BY MOUTH EVERY 6 HOURS AS NEEDED   TRUE METRIX BLOOD GLUCOSE TEST  test strip TEST BLOOD SUGAR TWICE DAILY   TRUEplus Lancets 30G MISC 1 each by Does not apply route 2 (two) times daily. E11.9   vitamin B-12 (CYANOCOBALAMIN) 500 MCG tablet Take 500 mcg by mouth daily.   [DISCONTINUED] spironolactone (ALDACTONE) 25 MG tablet Take 0.5 tablets (12.5 mg total) by mouth daily.    Radiology    No results found.  Cardiac Studies:   Holter Monitor for 24 hours  10/09/2016: Normal sinus rhythm, sinus bradycardia and sinus tachycardia with heart rate ranging from 45-121bpm with average heart rate 69bpm. Frequent PVCs, bigeminal PVCs, salvos and nonsustained ventricular tachycardia up to 5 beats in a ros PVC load was 20%. Occasional PACs and nonsustained atrial tachycardia up to 7 beats in a row.  Event monitor 30 days 07/30/2018: Predominant rhythm is normal sinus rhythm.  There were 3 runs of NSVT, maximum 13 beats, minimum 3 beats, asymptomatic.  Frequent PVCs, ventricular ectopic burden 5%.  There are occasional PACs.  Echocardiogram 08/28/2018: Left ventricle cavity is normal in size. Moderate concentric hypertrophy of the left ventricle. Normal global wall motion.  Doppler evidence of grade II (pseudonormal) diastolic dysfunction, elevated LAP. Calculated EF 53%. Mild (Grade I) mitral regurgitation. Mild tricuspid regurgitation. Estimated pulmonary artery systolic pressure 26 mmHg. Mild pulmonic regurgitation. Compared to 10/09/16, previously mild LVH.   Lexiscan Myoview stress test 10/08/2018: Lexiscan stress test was performed. Stress EKG is non-diagnostic, as this is pharmacological stress test. Myocardial pefusion imaging is normal. LVEF 65%. Low risk study.  No significant change from  10/09/2016.  Lower Extremity Venous Duplex  10/01/2019: Nonocclusive deep venous thrombus identified within the LEFT popliteal and LEFT peroneal veins.  No evidence of deep venous thrombosis in the RIGHT lower extremity.   Lower Extremity Venous Duplex left leg   11/12/2019: Redemonstration of nonocclusive left popliteal vein DVT. No evidence of extension, and no evidence of proximal DVT.   Tibial veins appear patent with no DVT.  Lower Extremity Venous Duplex  04/30/2020: Sonographic survey of the bilateral lower extremities positive for new acute DVT of the bilateral common femoral, femoral, popliteal, and tibial vessels. Thrombus likely extends cephalad into at least the iliac veins.  EKG:   EKG 07/12/2020: Normal sinus rhythm at rate of 77 bpm, inferior infarct old.  Anteroseptal infarct old.  No evidence of ischemia.  Voltage criteria for LVH.    EKG 05/31/2018: Sinus rhythm with first-degree AV block at rate of 56 bpm, left axis deviation, left anterior fascicular block.  Cannot exclude inferior infarct old, anteroseptal infarct old.  No evidence of ischemia.     Assessment     ICD-10-CM   1. Colon cancer metastasized to liver (HCC)  C18.9    C78.7     2. Bilateral leg edema  R60.0         No orders of the defined types were placed in this encounter.  Medications Discontinued During This Encounter  Medication Reason   doxycycline (VIBRA-TABS) 100 MG tablet Error   methylphenidate (RITALIN) 10 MG tablet Error   spironolactone (ALDACTONE) 25 MG tablet Side effect (s)      Recommendations:   Tonya [name]  is a 80 y.o. female  with Hypertension, hyperlipidemia, uncontrolled diabetes mellitus, history of stroke involving the right pons in 2001 with moderate diffuse atherosclerotic changes in the intracerebral vessels when she presented with left-sided weakness and slurred speech, lumbosacral spondylosi, nonsmoker, h/o GI bleed on 10/18/2018 and anemia due to descending colonic cancer with liver metastasis and presently on chemotherapy and h/o DVT in July 2021 and now on Eliquis presents for follow-up of hypertension, dyspnea on exertion and abnormal EKG with frequent PVCs. Eliquis 5 mg po BID -- started on 09/30/2019 --DC on 04/01/2020  secondary to GI bleeding -- re-started on 05/03/2020 due to recurrence of bilateral extensive DVT.  Patient was seen in our office 09/09/2020 with concerns of blood pressure management and shortness of breath.  At that time blood pressure was well controlled and patient's alertness of breath was related to volume overload secondary to malignant metastases to her liver.  There is no evidence of acute heart failure, but rather ascites.  Therefore switch patient from furosemide to torsemide 20 mg daily with additional doses as needed and added spironolactone 12.5 mg daily, repeat BMP remained relatively stable.  Patient was advised at last visit to follow-up as needed, particularly given poor prognosis and extensive discussion regarding recommendation of palliative care consult.  Since last visit patient has had meeting with hematology oncology team and shared decision was to discontinue treatment and transition patient to hospice care.  Primary  focus of today's visit was around end-of-life care.  Patient was seen in collaboration with Dr. Einar Gip.  Have discontinued spironolactone in order to avoid ongoing episodes of symptomatic hypotension.  Discussed at length with patient regarding poor prognosis and importance of comfort at end-of-life.  Patient and her daughter were receptive to conversation and are willing to reconsider discontinuing care that prohibits initiation of hospice.  We reassured patient that our office will work with her to coordinate reevaluation for hospice care.  Educated patient and her daughter that there is no indication to obtain repeat PET scan.  Patient and her daughter both verbalized understanding agreement.  Our office will work to coordinate hospice care as well as discontinuation of PT services at patient request.  Follow-up as needed.  Patient was seen in collaboration with Dr. Einar Gip. He also reviewed patient's chart and examined the patient. Dr. Einar Gip is in agreement of the  plan.    Alethia Berthold, PA-C 10/21/2020, 4:17 PM Office: [phone]  ES:LPNPY [place], MD  Following today's visit spoke with patient's daughter again regarding hospice care and she would like to speak to her mother about it. Patient's daughter wants to explicitly hear from her mother that she wishes to discontinue iron infusions and transition to hospice. Will therefore hold off on sending referral but have asked patient's daughter to call the office back.    This was a 45-minute encounter with face-to-face counseling, medical records review, coordination of care, explanation of complex medical issues, complex medical decision making.

## 2020-10-21 ENCOUNTER — Encounter: Payer: Self-pay | Admitting: Student

## 2020-10-21 ENCOUNTER — Other Ambulatory Visit: Payer: Self-pay

## 2020-10-21 ENCOUNTER — Ambulatory Visit: Payer: Medicare HMO | Admitting: Student

## 2020-10-21 VITALS — BP 108/78 | HR 103 | Resp 16 | Ht 71.0 in | Wt 161.0 lb

## 2020-10-21 DIAGNOSIS — R600 Localized edema: Secondary | ICD-10-CM

## 2020-10-21 DIAGNOSIS — S82831D Other fracture of upper and lower end of right fibula, subsequent encounter for closed fracture with routine healing: Secondary | ICD-10-CM | POA: Diagnosis not present

## 2020-10-21 DIAGNOSIS — E119 Type 2 diabetes mellitus without complications: Secondary | ICD-10-CM | POA: Diagnosis not present

## 2020-10-21 DIAGNOSIS — Z794 Long term (current) use of insulin: Secondary | ICD-10-CM | POA: Diagnosis not present

## 2020-10-21 DIAGNOSIS — C787 Secondary malignant neoplasm of liver and intrahepatic bile duct: Secondary | ICD-10-CM

## 2020-10-21 DIAGNOSIS — C229 Malignant neoplasm of liver, not specified as primary or secondary: Secondary | ICD-10-CM | POA: Diagnosis not present

## 2020-10-21 DIAGNOSIS — Z7901 Long term (current) use of anticoagulants: Secondary | ICD-10-CM | POA: Diagnosis not present

## 2020-10-21 DIAGNOSIS — C189 Malignant neoplasm of colon, unspecified: Secondary | ICD-10-CM

## 2020-10-21 DIAGNOSIS — I10 Essential (primary) hypertension: Secondary | ICD-10-CM | POA: Diagnosis not present

## 2020-10-21 DIAGNOSIS — I69354 Hemiplegia and hemiparesis following cerebral infarction affecting left non-dominant side: Secondary | ICD-10-CM | POA: Diagnosis not present

## 2020-10-21 DIAGNOSIS — I4891 Unspecified atrial fibrillation: Secondary | ICD-10-CM | POA: Diagnosis not present

## 2020-10-22 DIAGNOSIS — I4891 Unspecified atrial fibrillation: Secondary | ICD-10-CM | POA: Diagnosis not present

## 2020-10-22 DIAGNOSIS — I69354 Hemiplegia and hemiparesis following cerebral infarction affecting left non-dominant side: Secondary | ICD-10-CM | POA: Diagnosis not present

## 2020-10-22 DIAGNOSIS — E119 Type 2 diabetes mellitus without complications: Secondary | ICD-10-CM | POA: Diagnosis not present

## 2020-10-22 DIAGNOSIS — S82831D Other fracture of upper and lower end of right fibula, subsequent encounter for closed fracture with routine healing: Secondary | ICD-10-CM | POA: Diagnosis not present

## 2020-10-22 DIAGNOSIS — Z794 Long term (current) use of insulin: Secondary | ICD-10-CM | POA: Diagnosis not present

## 2020-10-22 DIAGNOSIS — C189 Malignant neoplasm of colon, unspecified: Secondary | ICD-10-CM | POA: Diagnosis not present

## 2020-10-22 DIAGNOSIS — C229 Malignant neoplasm of liver, not specified as primary or secondary: Secondary | ICD-10-CM | POA: Diagnosis not present

## 2020-10-22 DIAGNOSIS — I10 Essential (primary) hypertension: Secondary | ICD-10-CM | POA: Diagnosis not present

## 2020-10-22 DIAGNOSIS — Z7901 Long term (current) use of anticoagulants: Secondary | ICD-10-CM | POA: Diagnosis not present

## 2020-10-26 ENCOUNTER — Telehealth: Payer: Self-pay

## 2020-10-26 NOTE — Telephone Encounter (Signed)
SW provided a supportive telephone call to patient's daughter-Tonya [name]. SW provided additional education regarding hospice care-the benefits of where patient is in her disease process. Tonya [name] stated that an AV nurse came out to talk to her and explained the services. Tonya [name] stated that she was on board until she was told that her mother could only receive medications by mouth-she would have to discontinue her iron infusions and insulin. SW advised that she will follow-up with the medical director regarding this. SW reviewed the [facility] AV note and there are some discrepancies between what she is saying and what is documented. SW to follow-up.

## 2020-10-26 NOTE — Telephone Encounter (Signed)
Received message to call patient's daughter, [name]. Spoke with [name] who inquired about frequency of Palliative care visits. Also, would like some information on caregiving agencies and to f/u with a SW re: an application that was started. Primary Palliative NP updated and SW updated.

## 2020-10-27 ENCOUNTER — Other Ambulatory Visit: Payer: Medicare HMO

## 2020-10-27 ENCOUNTER — Ambulatory Visit: Payer: Medicare HMO | Admitting: Hematology & Oncology

## 2020-10-27 ENCOUNTER — Ambulatory Visit: Payer: Medicare HMO

## 2020-10-27 DIAGNOSIS — I69354 Hemiplegia and hemiparesis following cerebral infarction affecting left non-dominant side: Secondary | ICD-10-CM | POA: Diagnosis not present

## 2020-10-27 DIAGNOSIS — Z7901 Long term (current) use of anticoagulants: Secondary | ICD-10-CM | POA: Diagnosis not present

## 2020-10-27 DIAGNOSIS — I4891 Unspecified atrial fibrillation: Secondary | ICD-10-CM | POA: Diagnosis not present

## 2020-10-27 DIAGNOSIS — Z794 Long term (current) use of insulin: Secondary | ICD-10-CM | POA: Diagnosis not present

## 2020-10-27 DIAGNOSIS — E119 Type 2 diabetes mellitus without complications: Secondary | ICD-10-CM | POA: Diagnosis not present

## 2020-10-27 DIAGNOSIS — C229 Malignant neoplasm of liver, not specified as primary or secondary: Secondary | ICD-10-CM | POA: Diagnosis not present

## 2020-10-27 DIAGNOSIS — C189 Malignant neoplasm of colon, unspecified: Secondary | ICD-10-CM | POA: Diagnosis not present

## 2020-10-27 DIAGNOSIS — S82831D Other fracture of upper and lower end of right fibula, subsequent encounter for closed fracture with routine healing: Secondary | ICD-10-CM | POA: Diagnosis not present

## 2020-10-27 DIAGNOSIS — I10 Essential (primary) hypertension: Secondary | ICD-10-CM | POA: Diagnosis not present

## 2020-10-31 DIAGNOSIS — I69354 Hemiplegia and hemiparesis following cerebral infarction affecting left non-dominant side: Secondary | ICD-10-CM | POA: Diagnosis not present

## 2020-11-01 DIAGNOSIS — D630 Anemia in neoplastic disease: Secondary | ICD-10-CM | POA: Diagnosis not present

## 2020-11-01 DIAGNOSIS — C787 Secondary malignant neoplasm of liver and intrahepatic bile duct: Secondary | ICD-10-CM | POA: Diagnosis not present

## 2020-11-01 DIAGNOSIS — I82502 Chronic embolism and thrombosis of unspecified deep veins of left lower extremity: Secondary | ICD-10-CM | POA: Diagnosis not present

## 2020-11-01 DIAGNOSIS — R262 Difficulty in walking, not elsewhere classified: Secondary | ICD-10-CM | POA: Diagnosis not present

## 2020-11-01 DIAGNOSIS — I69354 Hemiplegia and hemiparesis following cerebral infarction affecting left non-dominant side: Secondary | ICD-10-CM | POA: Diagnosis not present

## 2020-11-02 ENCOUNTER — Telehealth: Payer: Self-pay | Admitting: *Deleted

## 2020-11-02 ENCOUNTER — Ambulatory Visit: Payer: Medicare HMO | Admitting: Hematology & Oncology

## 2020-11-02 ENCOUNTER — Other Ambulatory Visit: Payer: Medicare HMO

## 2020-11-02 ENCOUNTER — Telehealth: Payer: Self-pay | Admitting: Nurse Practitioner

## 2020-11-02 ENCOUNTER — Ambulatory Visit: Payer: Medicare HMO | Admitting: Podiatry

## 2020-11-02 ENCOUNTER — Telehealth: Payer: Self-pay

## 2020-11-02 ENCOUNTER — Inpatient Hospital Stay: Payer: Medicare HMO

## 2020-11-02 ENCOUNTER — Ambulatory Visit: Payer: Medicare HMO

## 2020-11-02 NOTE — Telephone Encounter (Signed)
I cannot do that and it will not improve her quality of life, it will just be our mindset. Happy to discuss with her. I am sorry she is going through such rough phase

## 2020-11-02 NOTE — Telephone Encounter (Signed)
Patients daughter [name] called and asked if you could put orders for her mother to have iron and or blood transfusion.

## 2020-11-02 NOTE — Telephone Encounter (Signed)
Received call from [place], patients daughter asking about blood transfusions and iron infusions.  Told Tonya [name] that we were under the impression that she was with Hospice and they wont allow blood transfusions or iron infusion.  Tonya [name] told me that they refused Hospice because of this reason.  Told patients daughter that they had an appointment today.  Tonya [name] said her mom was throwing up and was under the impression that someone let us know.  Dr Marin Olp notified of above and message sent to schedulers

## 2020-11-03 ENCOUNTER — Telehealth: Payer: Self-pay

## 2020-11-04 ENCOUNTER — Emergency Department (HOSPITAL_COMMUNITY): Payer: Medicare HMO

## 2020-11-04 ENCOUNTER — Telehealth: Payer: Self-pay | Admitting: *Deleted

## 2020-11-04 ENCOUNTER — Other Ambulatory Visit: Payer: Self-pay

## 2020-11-04 ENCOUNTER — Inpatient Hospital Stay (HOSPITAL_COMMUNITY)
Admission: EM | Admit: 2020-11-04 | Discharge: 2020-11-08 | DRG: 374 | Disposition: A | Discharge status: Home / Self Care | Payer: Medicare HMO | Attending: Internal Medicine | Admitting: Internal Medicine

## 2020-11-04 ENCOUNTER — Encounter (HOSPITAL_COMMUNITY): Payer: Self-pay

## 2020-11-04 DIAGNOSIS — C787 Secondary malignant neoplasm of liver and intrahepatic bile duct: Secondary | ICD-10-CM | POA: Diagnosis present

## 2020-11-04 DIAGNOSIS — Z95828 Presence of other vascular implants and grafts: Secondary | ICD-10-CM

## 2020-11-04 DIAGNOSIS — D509 Iron deficiency anemia, unspecified: Secondary | ICD-10-CM | POA: Diagnosis present

## 2020-11-04 DIAGNOSIS — K259 Gastric ulcer, unspecified as acute or chronic, without hemorrhage or perforation: Secondary | ICD-10-CM | POA: Diagnosis present

## 2020-11-04 DIAGNOSIS — N179 Acute kidney failure, unspecified: Secondary | ICD-10-CM | POA: Diagnosis present

## 2020-11-04 DIAGNOSIS — K922 Gastrointestinal hemorrhage, unspecified: Secondary | ICD-10-CM | POA: Diagnosis present

## 2020-11-04 DIAGNOSIS — I959 Hypotension, unspecified: Secondary | ICD-10-CM | POA: Diagnosis present

## 2020-11-04 DIAGNOSIS — I119 Hypertensive heart disease without heart failure: Secondary | ICD-10-CM | POA: Diagnosis present

## 2020-11-04 DIAGNOSIS — R0902 Hypoxemia: Secondary | ICD-10-CM | POA: Diagnosis not present

## 2020-11-04 DIAGNOSIS — Z888 Allergy status to other drugs, medicaments and biological substances status: Secondary | ICD-10-CM

## 2020-11-04 DIAGNOSIS — D6832 Hemorrhagic disorder due to extrinsic circulating anticoagulants: Secondary | ICD-10-CM | POA: Diagnosis present

## 2020-11-04 DIAGNOSIS — K7290 Hepatic failure, unspecified without coma: Secondary | ICD-10-CM | POA: Diagnosis present

## 2020-11-04 DIAGNOSIS — I700 Atherosclerosis of aorta: Secondary | ICD-10-CM | POA: Diagnosis present

## 2020-11-04 DIAGNOSIS — Z87891 Personal history of nicotine dependence: Secondary | ICD-10-CM

## 2020-11-04 DIAGNOSIS — I69354 Hemiplegia and hemiparesis following cerebral infarction affecting left non-dominant side: Secondary | ICD-10-CM | POA: Diagnosis not present

## 2020-11-04 DIAGNOSIS — D500 Iron deficiency anemia secondary to blood loss (chronic): Secondary | ICD-10-CM | POA: Diagnosis present

## 2020-11-04 DIAGNOSIS — E785 Hyperlipidemia, unspecified: Secondary | ICD-10-CM | POA: Diagnosis present

## 2020-11-04 DIAGNOSIS — Z20822 Contact with and (suspected) exposure to covid-19: Secondary | ICD-10-CM | POA: Diagnosis present

## 2020-11-04 DIAGNOSIS — R4182 Altered mental status, unspecified: Secondary | ICD-10-CM | POA: Diagnosis not present

## 2020-11-04 DIAGNOSIS — Z66 Do not resuscitate: Secondary | ICD-10-CM | POA: Diagnosis present

## 2020-11-04 DIAGNOSIS — K254 Chronic or unspecified gastric ulcer with hemorrhage: Secondary | ICD-10-CM | POA: Diagnosis present

## 2020-11-04 DIAGNOSIS — I10 Essential (primary) hypertension: Secondary | ICD-10-CM | POA: Diagnosis present

## 2020-11-04 DIAGNOSIS — R5381 Other malaise: Secondary | ICD-10-CM | POA: Diagnosis not present

## 2020-11-04 DIAGNOSIS — S82831D Other fracture of upper and lower end of right fibula, subsequent encounter for closed fracture with routine healing: Secondary | ICD-10-CM | POA: Diagnosis not present

## 2020-11-04 DIAGNOSIS — Z886 Allergy status to analgesic agent status: Secondary | ICD-10-CM

## 2020-11-04 DIAGNOSIS — K219 Gastro-esophageal reflux disease without esophagitis: Secondary | ICD-10-CM | POA: Diagnosis present

## 2020-11-04 DIAGNOSIS — J449 Chronic obstructive pulmonary disease, unspecified: Secondary | ICD-10-CM | POA: Diagnosis present

## 2020-11-04 DIAGNOSIS — J90 Pleural effusion, not elsewhere classified: Secondary | ICD-10-CM | POA: Diagnosis present

## 2020-11-04 DIAGNOSIS — Z885 Allergy status to narcotic agent status: Secondary | ICD-10-CM

## 2020-11-04 DIAGNOSIS — C189 Malignant neoplasm of colon, unspecified: Secondary | ICD-10-CM | POA: Diagnosis not present

## 2020-11-04 DIAGNOSIS — D630 Anemia in neoplastic disease: Secondary | ICD-10-CM | POA: Diagnosis present

## 2020-11-04 DIAGNOSIS — M7989 Other specified soft tissue disorders: Secondary | ICD-10-CM | POA: Diagnosis not present

## 2020-11-04 DIAGNOSIS — C19 Malignant neoplasm of rectosigmoid junction: Secondary | ICD-10-CM | POA: Diagnosis present

## 2020-11-04 DIAGNOSIS — K3189 Other diseases of stomach and duodenum: Secondary | ICD-10-CM | POA: Diagnosis not present

## 2020-11-04 DIAGNOSIS — I69359 Hemiplegia and hemiparesis following cerebral infarction affecting unspecified side: Secondary | ICD-10-CM

## 2020-11-04 DIAGNOSIS — D689 Coagulation defect, unspecified: Secondary | ICD-10-CM | POA: Diagnosis present

## 2020-11-04 DIAGNOSIS — I749 Embolism and thrombosis of unspecified artery: Secondary | ICD-10-CM | POA: Diagnosis present

## 2020-11-04 DIAGNOSIS — E119 Type 2 diabetes mellitus without complications: Secondary | ICD-10-CM | POA: Diagnosis present

## 2020-11-04 DIAGNOSIS — T451X5A Adverse effect of antineoplastic and immunosuppressive drugs, initial encounter: Secondary | ICD-10-CM | POA: Diagnosis present

## 2020-11-04 DIAGNOSIS — Z7901 Long term (current) use of anticoagulants: Secondary | ICD-10-CM | POA: Diagnosis not present

## 2020-11-04 DIAGNOSIS — Z7189 Other specified counseling: Secondary | ICD-10-CM | POA: Diagnosis not present

## 2020-11-04 DIAGNOSIS — Z7401 Bed confinement status: Secondary | ICD-10-CM

## 2020-11-04 DIAGNOSIS — C182 Malignant neoplasm of ascending colon: Principal | ICD-10-CM | POA: Diagnosis present

## 2020-11-04 DIAGNOSIS — R58 Hemorrhage, not elsewhere classified: Secondary | ICD-10-CM | POA: Diagnosis not present

## 2020-11-04 DIAGNOSIS — Z794 Long term (current) use of insulin: Secondary | ICD-10-CM | POA: Diagnosis not present

## 2020-11-04 DIAGNOSIS — R1110 Vomiting, unspecified: Secondary | ICD-10-CM | POA: Diagnosis present

## 2020-11-04 DIAGNOSIS — C229 Malignant neoplasm of liver, not specified as primary or secondary: Secondary | ICD-10-CM | POA: Diagnosis not present

## 2020-11-04 DIAGNOSIS — Z8249 Family history of ischemic heart disease and other diseases of the circulatory system: Secondary | ICD-10-CM

## 2020-11-04 DIAGNOSIS — R000 Tachycardia, unspecified: Secondary | ICD-10-CM | POA: Diagnosis not present

## 2020-11-04 DIAGNOSIS — Z79899 Other long term (current) drug therapy: Secondary | ICD-10-CM

## 2020-11-04 DIAGNOSIS — L89152 Pressure ulcer of sacral region, stage 2: Secondary | ICD-10-CM | POA: Diagnosis present

## 2020-11-04 DIAGNOSIS — I82502 Chronic embolism and thrombosis of unspecified deep veins of left lower extremity: Secondary | ICD-10-CM | POA: Diagnosis present

## 2020-11-04 DIAGNOSIS — Z515 Encounter for palliative care: Secondary | ICD-10-CM

## 2020-11-04 DIAGNOSIS — Z923 Personal history of irradiation: Secondary | ICD-10-CM

## 2020-11-04 DIAGNOSIS — I4891 Unspecified atrial fibrillation: Secondary | ICD-10-CM | POA: Diagnosis not present

## 2020-11-04 DIAGNOSIS — K279 Peptic ulcer, site unspecified, unspecified as acute or chronic, without hemorrhage or perforation: Secondary | ICD-10-CM | POA: Diagnosis not present

## 2020-11-04 DIAGNOSIS — Z833 Family history of diabetes mellitus: Secondary | ICD-10-CM

## 2020-11-04 DIAGNOSIS — L8990 Pressure ulcer of unspecified site, unspecified stage: Secondary | ICD-10-CM | POA: Insufficient documentation

## 2020-11-04 DIAGNOSIS — D6481 Anemia due to antineoplastic chemotherapy: Secondary | ICD-10-CM | POA: Diagnosis present

## 2020-11-04 DIAGNOSIS — R54 Age-related physical debility: Secondary | ICD-10-CM | POA: Diagnosis present

## 2020-11-04 DIAGNOSIS — D649 Anemia, unspecified: Secondary | ICD-10-CM | POA: Diagnosis not present

## 2020-11-04 DIAGNOSIS — I872 Venous insufficiency (chronic) (peripheral): Secondary | ICD-10-CM | POA: Diagnosis present

## 2020-11-04 LAB — CBC WITH DIFFERENTIAL/PLATELET
Abs Immature Granulocytes: 0.08 10*3/uL — ABNORMAL HIGH (ref 0.00–0.07)
Basophils Absolute: 0 10*3/uL (ref 0.0–0.1)
Basophils Relative: 0 %
Eosinophils Absolute: 0 10*3/uL (ref 0.0–0.5)
Eosinophils Relative: 0 %
HCT: 24.8 % — ABNORMAL LOW (ref 36.0–46.0)
Hemoglobin: 7.6 g/dL — ABNORMAL LOW (ref 12.0–15.0)
Immature Granulocytes: 1 %
Lymphocytes Relative: 6 %
Lymphs Abs: 0.8 10*3/uL (ref 0.7–4.0)
MCH: 29.2 pg (ref 26.0–34.0)
MCHC: 30.6 g/dL (ref 30.0–36.0)
MCV: 95.4 fL (ref 80.0–100.0)
Monocytes Absolute: 1.5 10*3/uL — ABNORMAL HIGH (ref 0.1–1.0)
Monocytes Relative: 11 %
Neutro Abs: 11.1 10*3/uL — ABNORMAL HIGH (ref 1.7–7.7)
Neutrophils Relative %: 82 %
Platelets: 247 10*3/uL (ref 150–400)
RBC: 2.6 MIL/uL — ABNORMAL LOW (ref 3.87–5.11)
RDW: 19.5 % — ABNORMAL HIGH (ref 11.5–15.5)
WBC: 13.5 10*3/uL — ABNORMAL HIGH (ref 4.0–10.5)
nRBC: 0 % (ref 0.0–0.2)

## 2020-11-04 LAB — PROTIME-INR
INR: 2.3 — ABNORMAL HIGH (ref 0.8–1.2)
Prothrombin Time: 25.5 seconds — ABNORMAL HIGH (ref 11.4–15.2)

## 2020-11-04 LAB — COMPREHENSIVE METABOLIC PANEL
ALT: 16 U/L (ref 0–44)
AST: 40 U/L (ref 15–41)
Albumin: 2.1 g/dL — ABNORMAL LOW (ref 3.5–5.0)
Alkaline Phosphatase: 261 U/L — ABNORMAL HIGH (ref 38–126)
Anion gap: 10 (ref 5–15)
BUN: 30 mg/dL — ABNORMAL HIGH (ref 8–23)
CO2: 28 mmol/L (ref 22–32)
Calcium: 8.7 mg/dL — ABNORMAL LOW (ref 8.9–10.3)
Chloride: 102 mmol/L (ref 98–111)
Creatinine, Ser: 1.49 mg/dL — ABNORMAL HIGH (ref 0.44–1.00)
GFR, Estimated: 35 mL/min — ABNORMAL LOW (ref >60)
Glucose, Bld: 149 mg/dL — ABNORMAL HIGH (ref 70–99)
Potassium: 4 mmol/L (ref 3.5–5.1)
Sodium: 140 mmol/L (ref 135–145)
Total Bilirubin: 1 mg/dL (ref 0.3–1.2)
Total Protein: 4.8 g/dL — ABNORMAL LOW (ref 6.5–8.1)

## 2020-11-04 LAB — GLUCOSE, CAPILLARY
Glucose-Capillary: 112 mg/dL — ABNORMAL HIGH (ref 70–99)
Glucose-Capillary: 126 mg/dL — ABNORMAL HIGH (ref 70–99)

## 2020-11-04 LAB — PREPARE RBC (CROSSMATCH)

## 2020-11-04 LAB — APTT: aPTT: >200 seconds (ref 24–36)

## 2020-11-04 LAB — SARS CORONAVIRUS 2 (TAT 6-24 HRS): SARS Coronavirus 2: NEGATIVE

## 2020-11-04 LAB — POC OCCULT BLOOD, ED: Fecal Occult Bld: POSITIVE — AB

## 2020-11-04 MED ORDER — SODIUM CHLORIDE 0.9% FLUSH
10.0000 mL | Freq: Two times a day (BID) | INTRAVENOUS | Status: DC
  Administered 2020-11-05 – 2020-11-08 (×2): 10 mL

## 2020-11-04 MED ORDER — NONFORMULARY OR COMPOUNDED ITEM
1.0000 "application " | Freq: Every day | Status: DC
  Administered 2020-11-04 – 2020-11-07 (×4): 1 via TOPICAL

## 2020-11-04 MED ORDER — SODIUM CHLORIDE 0.9 % IV SOLN
10.0000 mL/h | Freq: Once | INTRAVENOUS | Status: AC
  Administered 2020-11-04: 10 mL/h via INTRAVENOUS

## 2020-11-04 MED ORDER — CHLORHEXIDINE GLUCONATE CLOTH 2 % EX PADS
6.0000 | MEDICATED_PAD | Freq: Every day | CUTANEOUS | Status: DC
  Administered 2020-11-05 – 2020-11-08 (×4): 6 via TOPICAL

## 2020-11-04 MED ORDER — FENTANYL CITRATE (PF) 100 MCG/2ML IJ SOLN
25.0000 ug | Freq: Once | INTRAMUSCULAR | Status: AC
  Administered 2020-11-04: 25 ug via INTRAVENOUS
  Filled 2020-11-04: qty 2

## 2020-11-04 MED ORDER — ONDANSETRON HCL 4 MG/2ML IJ SOLN
4.0000 mg | Freq: Once | INTRAMUSCULAR | Status: AC
  Administered 2020-11-04: 4 mg via INTRAVENOUS
  Filled 2020-11-04: qty 2

## 2020-11-04 NOTE — ED Triage Notes (Signed)
BIB EMS from Home d/t BM this am with bright red blood. Per ems pt is on BT and on hospice/pallitive care and MD has been refusing PET scan and blood/Iron transfusion. Family told ems the reason for calling was to obtain blood/iron transfusion.  EMS Vitals: 101/69 Cbg-214 HR 100 Spo2 100%

## 2020-11-04 NOTE — ED Notes (Signed)
Updates provided to family.

## 2020-11-04 NOTE — H&P (Addendum)
History and Physical    Tonya [name] WFU:[id number] DOB: [birth year] DOA: 11/04/2020  PCP: Leeroy Cha, MD  Patient coming from: Home.   History obtained from patient's daughter.  Chief Complaint: Rectal bleeding.  HPI: Tonya [name] is a 80 y.o. female with history of stage IV adenocarcinoma of the colon with history of DVT on Eliquis, hypertension, diabetes mellitus was brought to the ER after patient had rectal bleeding.  As per the patient's daughter who provided the history patient had minimal bleeding last night at home but this morning had a lot of bleeding and was brought to the ER.  Denies any vomiting.  Was able to eat.  Had nonspecific abdominal discomfort.  Patient also last 9 months has been bedbound.  Per daughter patient has not taken her Eliquis for last 2 days since they ran out.  ED Course: In the ER patient was initially hypotensive improved with fluid bolus.  Hemoglobin is around 7.6 last one in October 12, 2020 was 8.  Patient's daughter at this time is requesting blood transfusion but no GI procedures.  Patient admitted for further management of GI bleed in the setting of Eliquis with history of stage IV colon cancer for transfusion.  Review of Systems: As per HPI, rest all negative.   Past Medical History:  Diagnosis Date   Cerebrovascular accident [facility]) 2001   left side weakness   Chronic edema    ? venous insufficiency   Colon cancer ([place]) 2020   COPD (chronic obstructive pulmonary disease) ([place])    never had a problem breathing   Diabetes mellitus without complication ([place])    Dyspnea    Enlarged heart    per pt   Gallstones    GERD (gastroesophageal reflux disease)    Goals of care, counseling/discussion 11/01/2018   History of radiation therapy 04/08/2020-05/21/2020   Colon;Dr. Gery Pray   Hyperlipidemia    Hypertension    PONV (postoperative nausea and vomiting)    nausea     Past Surgical History:  Procedure Laterality Date    ABDOMINAL HYSTERECTOMY     BIOPSY  10/18/2018   Procedure: BIOPSY;  Surgeon: Jackquline Denmark, MD;  Location: [place];  Service: Endoscopy;;   CHOLECYSTECTOMY     COLONOSCOPY WITH PROPOFOL N/A 10/18/2018   Procedure: COLONOSCOPY WITH PROPOFOL;  Surgeon: Jackquline Denmark, MD;  Location: [place];  Service: Endoscopy;  Laterality: N/A;   IR IMAGING GUIDED PORT INSERTION  11/11/2018   IR IVC FILTER PLMT / S&I /IMG GUID/MOD SED  02/11/2020   POLYPECTOMY  10/18/2018   Procedure: POLYPECTOMY;  Surgeon: Jackquline Denmark, MD;  [facility] ENDOSCOPY;  Service: Endoscopy;;   SUBMUCOSAL TATTOO INJECTION  10/18/2018   Procedure: SUBMUCOSAL TATTOO INJECTION;  Surgeon: Jackquline Denmark, MD;  Location: [facility] ENDOSCOPY;  Service: Endoscopy;;     reports that she quit smoking about 52 years ago. Her smoking use included cigarettes. She smoked an average of .25 packs per day. She has never used smokeless tobacco. She reports that she does not drink alcohol and does not use drugs.  Allergies  Allergen Reactions   Hydrocodone Swelling and Other (See Comments)    "I started swelling, became red, and passed out"   Iohexol Hives, Shortness Of Breath and Other (See Comments)    Patient developed hives and fullness in throat post injection of 125cc's Omni 300, Onset Date: 11/15/2006    Naproxen Other (See Comments)    "It made me feel out of my head"  Other Other (See Comments)   Ibuprofen Nausea Only   Propoxyphene N-Acetaminophen Other (See Comments)    "I couldn't find the door to make my way out of the room- I was in misery"    Family History  Problem Relation Age of Onset   Cirrhosis Mother    Heart disease Father    Cancer Other    Hypertension Other    Diabetes Other     Prior to Admission medications   Medication Sig Start Date End Date Taking? Authorizing Provider  Alcohol Swabs (B-D SINGLE USE SWABS REGULAR) PADS 1 each by Does not apply route as needed (to cleanse site prior to obtaining droplet  of blood for CBG's). E11.9 07/08/19   Renato Shin, MD  B-D ULTRAFINE III SHORT PEN 31G X 8 MM MISC  09/01/19   [provider]  ELIQUIS 5 MG TABS tablet TAKE 1 TABLET(5 MG) BY MOUTH TWICE DAILY 08/18/20   [name], MD  famotidine (PEPCID) 40 MG tablet TAKE 1 TABLET TWICE DAILY 08/24/20   [name], MD  insulin degludec (TRESIBA FLEXTOUCH) 100 UNIT/ML FlexTouch Pen Inject 0.21 mLs (21 Units total) into the skin daily. Take 34 units on chemo days Patient taking differently: Inject 12 Units into the skin daily. Take 34 units on chemo days 09/19/19   Renato Shin, MD  ketoconazole (NIZORAL) 2 % cream Apply 1 application topically 2 (two) times daily. 02/09/20   McDonald, Stephan Minister, DPM  lidocaine-prilocaine (EMLA) cream Apply 1 application topically as needed for up to 30 doses. 09/01/20   [name], MD  magic mouthwash SOLN Swish and swallow equal parts Benadryl, Lidocaine and Nystatin.  5- 10 ml.'s four times a day as needed. Patient taking differently: Swish and swallow equal parts Benadryl, Lidocaine and Nystatin.  5- 10 ml.'s four times a day as needed. 05/27/19   [name], MD  meclizine (ANTIVERT) 12.5 MG tablet TAKE 1 TABLET BY MOUTH THREE TIMES DAILY AS NEEDED FOR DIZZINESS 09/27/20   [name], MD  metoprolol succinate (TOPROL-XL) 25 MG 24 hr tablet 25 mg daily. 01/02/20   [provider]  ondansetron (ZOFRAN) 8 MG tablet TAKE 1 TABLET(8 MG) BY MOUTH EVERY 8 HOURS AS NEEDED FOR NAUSEA OR VOMITING 10/15/20   [name], MD  prochlorperazine (COMPAZINE) 10 MG tablet TAKE 1 TABLET(10 MG) BY MOUTH EVERY 6 HOURS AS NEEDED FOR NAUSEA OR VOMITING 08/23/20   [name], MD  terbinafine (LAMISIL) 1 % cream Apply topically 2 (two) times daily. PRN 02/03/20   [provider]  torsemide (DEMADEX) 20 MG tablet Take 1 tablet (20 mg total) by mouth daily. With addition doses as needed. 09/09/20 01/07/21  Cantwell, Celeste C, PA-C  traMADol (ULTRAM) 50  MG tablet TAKE 1 TABLET(50 MG) BY MOUTH EVERY 6 HOURS AS NEEDED 08/31/20   [name], MD  TRUE METRIX BLOOD GLUCOSE TEST test strip TEST BLOOD SUGAR TWICE DAILY 01/16/20   Renato Shin, MD  TRUEplus Lancets 30G MISC 1 each by Does not apply route 2 (two) times daily. E11.9 07/08/19   Renato Shin, MD  vitamin B-12 (CYANOCOBALAMIN) 500 MCG tablet Take 500 mcg by mouth daily.    [provider]    Physical Exam: Constitutional: Moderately built and nourished. Vitals:   11/04/20 1209 11/04/20 1220 11/04/20 1300 11/04/20 1320  BP: (!) 83/61  97/75 106/62  Pulse: (!) 111  (!) 108 (!) 109  Resp: 18  20  20  Temp: 97.7 F (36.5 C)     TempSrc: Oral     SpO2: 97%  97% 98%  Weight:  73 kg    Height:  5\' 11"  (1.803 m)     Eyes: Anicteric mild pallor. ENMT: No discharge from the ears eyes nose and mouth. Neck: No mass felt.  No neck rigidity. Respiratory: No rhonchi or crepitations. Cardiovascular: S1-S2 heard. Abdomen: Soft nontender bowel sound present. Musculoskeletal: No edema. Skin: No rash. Neurologic: Alert awake but appears confused has left-sided hemiplegia from previous stroke. Psychiatric: Alert awake but appears confused.   Labs on Admission: I have personally reviewed following labs and imaging studies  CBC: Recent Labs  Lab 11/04/20 1252  WBC 13.5*  NEUTROABS 11.1*  HGB 7.6*  HCT 24.8*  MCV 95.4  PLT 130   Basic Metabolic Panel: Recent Labs  Lab 11/04/20 1252  NA 140  K 4.0  CL 102  CO2 28  GLUCOSE 149*  BUN 30*  CREATININE 1.49*  CALCIUM 8.7*   GFR: Estimated Creatinine Clearance: 32.5 mL/min (A) (by C-G formula based on SCr of 1.49 mg/dL (H)). Liver Function Tests: Recent Labs  Lab 11/04/20 1252  AST 40  ALT 16  ALKPHOS 261*  BILITOT 1.0  PROT 4.8*  ALBUMIN 2.1*   No results for input(s): LIPASE, AMYLASE in the last 168 hours. No results for input(s): AMMONIA in the last 168 hours. Coagulation Profile: Recent Labs  Lab  11/04/20 1252  INR 2.3*   Cardiac Enzymes: No results for input(s): CKTOTAL, CKMB, CKMBINDEX, TROPONINI in the last 168 hours. BNP (last 3 results) No results for input(s): PROBNP in the last 8760 hours. HbA1C: No results for input(s): HGBA1C in the last 72 hours. CBG: No results for input(s): GLUCAP in the last 168 hours. Lipid Profile: No results for input(s): CHOL, HDL, LDLCALC, TRIG, CHOLHDL, LDLDIRECT in the last 72 hours. Thyroid Function Tests: No results for input(s): TSH, T4TOTAL, FREET4, T3FREE, THYROIDAB in the last 72 hours. Anemia Panel: No results for input(s): VITAMINB12, FOLATE, FERRITIN, TIBC, IRON, RETICCTPCT in the last 72 hours. Urine analysis:    Component Value Date/Time   COLORURINE STRAW (A) 10/15/2018 2143   APPEARANCEUR CLEAR 10/15/2018 2143   LABSPEC 1.010 10/15/2018 2143   PHURINE 8.0 10/15/2018 2143   GLUCOSEU NEGATIVE 10/15/2018 2143   GLUCOSEU >=1000 (A) 10/10/2017 1216   HGBUR NEGATIVE 10/15/2018 2143   BILIRUBINUR NEGATIVE 10/15/2018 2143   BILIRUBINUR negative (A) 05/31/2018 1712   KETONESUR 5 (A) 10/15/2018 2143   PROTEINUR NEGATIVE 09/09/2019 1125   UROBILINOGEN 1.0 05/31/2018 1712   UROBILINOGEN 1.0 10/10/2017 1216   NITRITE NEGATIVE 10/15/2018 2143   LEUKOCYTESUR NEGATIVE 10/15/2018 2143   Sepsis Labs: @LABRCNTIP (procalcitonin:4,lacticidven:4) )No results found for this or any previous visit (from the past 240 hour(s)).   Radiological Exams on Admission: No results found.  Assessment/Plan Principal Problem:   Acute GI bleeding Active Problems:   Essential hypertension   CVA, old, hemiparesis (HCC)   Iron deficiency anemia due to chronic blood loss   Colon cancer metastasized to liver (HCC)   Diabetes ([place])    Acute GI bleeding in the setting of Eliquis with history of stage IV adenocarcinoma of the colon -patient's daughter was at the bedside is requesting any blood transfusion does not want any GI procedure.  Plan will be  is to eventually keep patient as comfortable as possible. Acute renal failure with creatinine worsening from normal 0.9 on October 13, 2019 it is around  1.4 likely from poor oral intake and GI bleed.  He will improve with fluids and blood transfusion. Stage IV adenocarcinoma of the colon Per daughter Dr. Burney Gauze patient's oncologist has advised no further chemotherapy. History of hypertension presently hypotensive at presentation.  Hold antihypertensives.  Patient usually takes metoprolol and torsemide which will be on hold. History of DVT on Eliquis which is on hold because of GI bleed.  Patient's daughter is agreeable to hold Eliquis in the setting of GI bleed. Diabetes mellitus type 2 we will check CBGs closely with no sliding scale at this time until we make sure patient's blood sugars are consistently high. History of CVA with left-sided hemiparesis.   COVID test is pending.   DVT prophylaxis: Patient's Eliquis is on hold due to GI bleed and no SCDs due to history of DVT. Code Status: DNR confirmed with patient's daughter. Family Communication: Patient's daughter. Disposition Plan: Home when stable. Consults called: Palliative care. Admission status: Observation.   Rise Patience MD [facility] Pager [phone].  If 7PM-7AM, please contact night-coverage [email or web address] Password [facility]  11/04/2020, 2:22 PM

## 2020-11-04 NOTE — Telephone Encounter (Signed)
Call received from patient's daughter [name] stating that patient is having a large amount of rectal bleeding with red blood this morning and has an oxygen saturation of 82%.  [name] notified to call 911 now.  [name] states that she will call 911 now.  Jory Ee NP notified.

## 2020-11-04 NOTE — ED Notes (Signed)
Blood bank has blood ready for this patient. Notified Ashley,RN.

## 2020-11-04 NOTE — ED Notes (Signed)
Blood consent signed

## 2020-11-04 NOTE — ED Provider Notes (Signed)
[facility] DEPT Provider Note   CSN: [id number] Arrival date & time: 11/04/20  1150     History Chief Complaint  Patient presents with   Rectal Bleeding    Tonya [name] is a 80 y.o. female.  Patient is an 80 year old female with a history of COPD, colon cancer, prior GI bleeding, DVTs on Eliquis, hypertension, recent discontinuing chemotherapy in June due to poor tolerance who is transitioning to palliative care who is presenting today due to a bright red bowel movement this morning.  Patient reports she has had nausea and vomiting over the last few days.  She is having intermittent pain in her upper abdomen.  However family reports that she had a very bloody stool today and that is why they called.  Due to transitioning over to hospice and palliative care MD has been refusing PET scans and blood iron transfusions but and family was calling today because they felt the patient needed a blood transfusion.  Patient complains of some mild shortness of breath but severe fatigue and weakness.  She denies any chest pain.  Last GI bleed was approximately 7 months ago.  The history is provided by the patient, medical records and a caregiver.  Rectal Bleeding Quality:  Maroon Amount:  Copious Duration:  1 day Timing:  Intermittent Chronicity:  Recurrent Context: spontaneously   Similar prior episodes: yes   Relieved by:  None tried Worsened by:  Defecation Ineffective treatments:  None tried Associated symptoms: abdominal pain, dizziness and vomiting   Associated symptoms: no fever   Associated symptoms comment:  Weakness, fatigue Risk factors: anticoagulant use and hx of colorectal cancer       Past Medical History:  Diagnosis Date   Cerebrovascular accident ([place]) 2001   left side weakness   Chronic edema    ? venous insufficiency   Colon cancer ([place]) 2020   COPD (chronic obstructive pulmonary disease) ([place])    never had a problem breathing    Diabetes mellitus without complication ([place])    Dyspnea    Enlarged heart    per pt   Gallstones    GERD (gastroesophageal reflux disease)    Goals of care, counseling/discussion 11/01/2018   History of radiation therapy 04/08/2020-05/21/2020   Colon;Dr. Gery Pray   Hyperlipidemia    Hypertension    PONV (postoperative nausea and vomiting)    nausea     Patient Active Problem List   Diagnosis Date Noted   Acute deep vein thrombosis (DVT) of left lower extremity ([place]) 05/12/2020   NSVT (nonsustained ventricular tachycardia) ([place]) 05/12/2020   Diabetes ([place]) 12/16/2018   Goals of care, counseling/discussion 11/01/2018   Colon cancer metastasized to liver ([place])    Symptomatic anemia 10/15/2018   D-dimer, elevated 10/11/2017   Leg edema, left 10/10/2017   Lesion of lip 10/10/2017   Vitamin D deficiency 07/17/2017   Spinal stenosis of lumbar region 01/23/2017   Frequent PVCs 11/23/2016   Heart murmur 09/19/2016   Iron deficiency anemia due to chronic blood loss 05/17/2016   Chronic right hip pain 06/08/2015   Abnormal EKG 02/23/2015   Bradycardia 10/20/2014   Routine general medical examination at a health care facility 01/02/2013   Hypertonicity of bladder 12/20/2009   Hyperlipidemia with target LDL less than 100 12/14/2008   GERD 11/03/2008   Right lumbar radiculitis 05/18/2008   Essential hypertension 07/26/2007   COPD 07/26/2007   CVA, old, hemiparesis ([place]) 07/26/2007    Past Surgical History:  Procedure  Laterality Date   ABDOMINAL HYSTERECTOMY     BIOPSY  10/18/2018   Procedure: BIOPSY;  Surgeon: Jackquline Denmark, MD;  Location: [facility];  Service: Endoscopy;;   CHOLECYSTECTOMY     COLONOSCOPY WITH PROPOFOL N/A 10/18/2018   Procedure: COLONOSCOPY WITH PROPOFOL;  Surgeon: Jackquline Denmark, MD;  Location[facility] ENDOSCOPY;  Service: Endoscopy;  Laterality: N/A;   IR IMAGING GUIDED PORT INSERTION  11/11/2018   IR IVC FILTER PLMT / S&I /IMG GUID/MOD SED  02/11/2020    POLYPECTOMY  10/18/2018   Procedure: POLYPECTOMY;  Surgeon: Jackquline Denmark, MD;  Location[facility] ENDOSCOPY;  Service: Endoscopy;;   SUBMUCOSAL TATTOO INJECTION  10/18/2018   Procedure: SUBMUCOSAL TATTOO INJECTION;  Surgeon: Jackquline Denmark, MD;  Location: [facility] ENDOSCOPY;  Service: Endoscopy;;     OB History   No obstetric history on file.     Family History  Problem Relation Age of Onset   Cirrhosis Mother    Heart disease Father    Cancer Other    Hypertension Other    Diabetes Other     Social History   Tobacco Use   Smoking status: Former    Packs/day: 0.25    Types: Cigarettes    Quit date: 07/23/1968    Years since quitting: 52.3   Smokeless tobacco: Never   Tobacco comments:    6 MONTH  Vaping Use   Vaping Use: Never used  Substance Use Topics   Alcohol use: No    Alcohol/week: 0.0 standard drinks   Drug use: No    Home Medications Prior to Admission medications   Medication Sig Start Date End Date Taking? Authorizing Provider  Alcohol Swabs (B-D SINGLE USE SWABS REGULAR) PADS 1 each by Does not apply route as needed (to cleanse site prior to obtaining droplet of blood for CBG's). E11.9 07/08/19   Renato Shin, MD  B-D ULTRAFINE III SHORT PEN 31G X 8 MM MISC  09/01/19   [provider]  ELIQUIS 5 MG TABS tablet TAKE 1 TABLET(5 MG) BY MOUTH TWICE DAILY 08/18/20   [name], MD  famotidine (PEPCID) 40 MG tablet TAKE 1 TABLET TWICE DAILY 08/24/20   [name], MD  insulin degludec (TRESIBA FLEXTOUCH) 100 UNIT/ML FlexTouch Pen Inject 0.21 mLs (21 Units total) into the skin daily. Take 34 units on chemo days Patient taking differently: Inject 12 Units into the skin daily. Take 34 units on chemo days 09/19/19   Renato Shin, MD  ketoconazole (NIZORAL) 2 % cream Apply 1 application topically 2 (two) times daily. 02/09/20   McDonald, Stephan Minister, DPM  lidocaine-prilocaine (EMLA) cream Apply 1 application topically as needed for up to 30 doses. 09/01/20   [name], MD  magic mouthwash SOLN Swish and swallow equal parts Benadryl, Lidocaine and Nystatin.  5- 10 ml.'s four times a day as needed. Patient taking differently: Swish and swallow equal parts Benadryl, Lidocaine and Nystatin.  5- 10 ml.'s four times a day as needed. 05/27/19   [name], MD  meclizine (ANTIVERT) 12.5 MG tablet TAKE 1 TABLET BY MOUTH THREE TIMES DAILY AS NEEDED FOR DIZZINESS 09/27/20   [name], MD  metoprolol succinate (TOPROL-XL) 25 MG 24 hr tablet 25 mg daily. 01/02/20   [provider]  ondansetron (ZOFRAN) 8 MG tablet TAKE 1 TABLET(8 MG) BY MOUTH EVERY 8 HOURS AS NEEDED FOR NAUSEA OR VOMITING 10/15/20   [name], MD  prochlorperazine (COMPAZINE) 10 MG tablet TAKE 1 TABLET(10 MG)  BY MOUTH EVERY 6 HOURS AS NEEDED FOR NAUSEA OR VOMITING 08/23/20   [name], MD  terbinafine (LAMISIL) 1 % cream Apply topically 2 (two) times daily. PRN 02/03/20   [provider]  torsemide (DEMADEX) 20 MG tablet Take 1 tablet (20 mg total) by mouth daily. With addition doses as needed. 09/09/20 01/07/21  Cantwell, Celeste C, PA-C  traMADol (ULTRAM) 50 MG tablet TAKE 1 TABLET(50 MG) BY MOUTH EVERY 6 HOURS AS NEEDED 08/31/20   [name], MD  TRUE METRIX BLOOD GLUCOSE TEST test strip TEST BLOOD SUGAR TWICE DAILY 01/16/20   Renato Shin, MD  TRUEplus Lancets 30G MISC 1 each by Does not apply route 2 (two) times daily. E11.9 07/08/19   Renato Shin, MD  vitamin B-12 (CYANOCOBALAMIN) 500 MCG tablet Take 500 mcg by mouth daily.    [provider]    Allergies    Hydrocodone, Iohexol, Naproxen, Other, Ibuprofen, and Propoxyphene n-acetaminophen  Review of Systems   Review of Systems  Constitutional:  Negative for fever.  Gastrointestinal:  Positive for abdominal pain, hematochezia and vomiting.  Neurological:  Positive for dizziness.  All other systems reviewed and are negative.  Physical Exam Updated Vital Signs BP (!) 83/61   Pulse (!) 111    Temp 97.7 F (36.5 C) (Oral)   Resp 18   Ht 5\' 11"  (1.803 m)   Wt 73 kg   SpO2 97%   BMI 22.45 kg/m   Physical Exam Vitals and nursing note reviewed.  Constitutional:      General: She is not in acute distress.    Appearance: She is well-developed. She is ill-appearing.  HENT:     Head: Normocephalic and atraumatic.  Eyes:     Pupils: Pupils are equal, round, and reactive to light.     Comments: Pale conjunctive a  Cardiovascular:     Rate and Rhythm: Regular rhythm. Tachycardia present.     Heart sounds: Murmur heard.  Systolic murmur is present with a grade of 3/6.     Comments: Thready distal pulses Pulmonary:     Effort: Pulmonary effort is normal. No respiratory distress.     Breath sounds: Normal breath sounds. No wheezing or rales.  Abdominal:     General: There is no distension.     Palpations: Abdomen is soft.     Tenderness: There is no abdominal tenderness. There is no guarding or rebound.  Genitourinary:    Comments: Hematochezia present on rectal exam.  No visual hemorrhoids Musculoskeletal:        General: No tenderness. Normal range of motion.     Cervical back: Normal range of motion and neck supple.     Right lower leg: Edema present.     Left lower leg: Edema present.     Comments: 2+ pitting edema in bilateral lower extremities  Skin:    General: Skin is warm and dry.     Coloration: Skin is pale.     Findings: No erythema or rash.     Comments: Decubitus wounds present on bilateral hips and sacrum  Neurological:     Mental Status: She is alert and oriented to person, place, and time. Mental status is at baseline.  Psychiatric:        Mood and Affect: Mood normal.        Behavior: Behavior normal.    ED Results / Procedures / Treatments   Labs (all labs ordered are listed, but only abnormal results are  displayed) Labs Reviewed  CBC WITH DIFFERENTIAL/PLATELET - Abnormal; Notable for the following components:      Result Value   WBC 13.5 (*)     RBC 2.60 (*)    Hemoglobin 7.6 (*)    HCT 24.8 (*)    RDW 19.5 (*)    Neutro Abs 11.1 (*)    Monocytes Absolute 1.5 (*)    Abs Immature Granulocytes 0.08 (*)    All other components within normal limits  COMPREHENSIVE METABOLIC PANEL - Abnormal; Notable for the following components:   Glucose, Bld 149 (*)    BUN 30 (*)    Creatinine, Ser 1.49 (*)    Calcium 8.7 (*)    Total Protein 4.8 (*)    Albumin 2.1 (*)    Alkaline Phosphatase 261 (*)    GFR, Estimated 35 (*)    All other components within normal limits  APTT - Abnormal; Notable for the following components:   aPTT >200 (*)    All other components within normal limits  PROTIME-INR - Abnormal; Notable for the following components:   Prothrombin Time 25.5 (*)    INR 2.3 (*)    All other components within normal limits  GLUCOSE, CAPILLARY - Abnormal; Notable for the following components:   Glucose-Capillary 112 (*)    All other components within normal limits  POC OCCULT BLOOD, ED - Abnormal; Notable for the following components:   Fecal Occult Bld POSITIVE (*)    All other components within normal limits  SARS CORONAVIRUS 2 (TAT 6-24 HRS)  TYPE AND SCREEN  PREPARE RBC (CROSSMATCH)    EKG EKG Interpretation  Date/Time:  Thursday November 04 2020 12:47:54 EDT Ventricular Rate:  106 PR Interval:  158 QRS Duration: 82 QT Interval:  380 QTC Calculation: 504 R Axis:   -14 Text Interpretation: Sinus tachycardia with Premature atrial complexes Low voltage QRS Cannot rule out Inferior infarct , age undetermined Cannot rule out Anteroseptal infarct , age undetermined Confirmed by Blanchie Dessert ([id number]) on 11/04/2020 1:16:12 PM  Radiology DG Chest Port 1 View  Result Date: 11/04/2020 CLINICAL DATA:  The patient reports right red blood per rectum this morning EXAM: PORTABLE CHEST 1 VIEW COMPARISON:  PA and lateral chest 12/02/2013. CT chest, abdomen and pelvis 07/08/2020. FINDINGS: Right Port-A-Cath is in place. The patient  has moderate right and small left pleural effusions with associated basilar airspace disease. Aortic atherosclerosis is noted. Heart size is normal. No acute or focal bony abnormality. IMPRESSION: Moderate right and small left pleural effusions and basilar airspace disease are new since the most recent exam. Aortic Atherosclerosis (ICD10-I70.0). Electronically Signed   By: Inge Rise M.D.   On: 11/04/2020 14:22    Procedures Procedures   Medications Ordered in ED Medications  ondansetron (ZOFRAN) injection 4 mg (has no administration in time range)  fentaNYL (SUBLIMAZE) injection 25 mcg (has no administration in time range)    ED Course  I have reviewed the triage vital signs and the nursing notes.  Pertinent labs & imaging results that were available during my care of the patient were reviewed by me and considered in my medical decision making (see chart for details).    MDM Rules/Calculators/A&P                          Elderly female with multiple medical problems presenting today with weakness, shortness of breath and bloody bowel movements starting this morning.  Patient has a history  of colorectal cancer recently has stopped chemotherapy due to poorly tolerating it and had been diagnosed with DVTs and is on Eliquis.  She is complaining of nausea, vomiting over the last few days.  She was transitioning to palliative care and they have been refusing blood and iron transfusions however with the GI bleeding today family called 911.  We will await her daughter to come back to give further information.  Patient appears anemic today she is very pale and tachycardic.  Blood pressure is soft.  Dark blood and clots per rectum.  We will need to discuss with the patient and her family member goals of care.  5:00 PM Patient's hemoglobin today is 7.6.  Daughter is at bedside and she and the patient both agree they would want her to have a blood transfusion.  Last Eliquis dose was approximately  36 hours ago.  PTT is elevated and INR is 2.3.  Gross blood on rectal exam.  Patient is currently hemodynamically stable with pressures in the low 100s.  Blood transfusion was ordered.  However chest x-ray did show some pleural effusions and she has distal edema concerning for some fluid overload.  Will admit for further care.  Patient transfused 2 units but will most likely need Lasix.  MDM   Amount and/or Complexity of Data Reviewed Clinical lab tests: ordered and reviewed Tests in the radiology section of CPT: ordered and reviewed Tests in the medicine section of CPT: ordered and reviewed Decide to obtain previous medical records or to obtain history from someone other than the patient: yes Obtain history from someone other than the patient: yes Review and summarize past medical records: yes Discuss the patient with other providers: yes Independent visualization of images, tracings, or specimens: yes  Risk of Complications, Morbidity, and/or Mortality Presenting problems: high Diagnostic procedures: high Management options: high   CRITICAL CARE Performed by: Jericho Cieslik Total critical care time: 30 minutes Critical care time was exclusive of separately billable procedures and treating other patients. Critical care was necessary to treat or prevent imminent or life-threatening deterioration. Critical care was time spent personally by me on the following activities: development of treatment plan with patient and/or surrogate as well as nursing, discussions with consultants, evaluation of patient's response to treatment, examination of patient, obtaining history from patient or surrogate, ordering and performing treatments and interventions, ordering and review of laboratory studies, ordering and review of radiographic studies, pulse oximetry and re-evaluation of patient's condition.   Final Clinical Impression(s) / ED Diagnoses Final diagnoses:  Acute GI bleeding  Symptomatic  anemia    Rx / DC Orders ED Discharge Orders     None        Blanchie Dessert, MD 11/04/20 1702

## 2020-11-05 ENCOUNTER — Encounter (HOSPITAL_COMMUNITY): Payer: Self-pay | Admitting: Internal Medicine

## 2020-11-05 ENCOUNTER — Observation Stay (HOSPITAL_COMMUNITY): Payer: Medicare HMO | Admitting: Certified Registered Nurse Anesthetist

## 2020-11-05 ENCOUNTER — Encounter (HOSPITAL_COMMUNITY)
Admission: EM | Disposition: A | Discharge status: Home / Self Care | Payer: Self-pay | Source: Home / Self Care | Attending: Family Medicine

## 2020-11-05 DIAGNOSIS — C189 Malignant neoplasm of colon, unspecified: Secondary | ICD-10-CM | POA: Diagnosis not present

## 2020-11-05 DIAGNOSIS — C787 Secondary malignant neoplasm of liver and intrahepatic bile duct: Secondary | ICD-10-CM | POA: Diagnosis not present

## 2020-11-05 DIAGNOSIS — K922 Gastrointestinal hemorrhage, unspecified: Secondary | ICD-10-CM | POA: Diagnosis not present

## 2020-11-05 DIAGNOSIS — L8990 Pressure ulcer of unspecified site, unspecified stage: Secondary | ICD-10-CM | POA: Insufficient documentation

## 2020-11-05 HISTORY — PX: ESOPHAGOGASTRODUODENOSCOPY (EGD) WITH PROPOFOL: SHX5813

## 2020-11-05 HISTORY — PX: BIOPSY: SHX5522

## 2020-11-05 LAB — BASIC METABOLIC PANEL
Anion gap: 10 (ref 5–15)
BUN: 32 mg/dL — ABNORMAL HIGH (ref 8–23)
CO2: 28 mmol/L (ref 22–32)
Calcium: 8.4 mg/dL — ABNORMAL LOW (ref 8.9–10.3)
Chloride: 101 mmol/L (ref 98–111)
Creatinine, Ser: 1.61 mg/dL — ABNORMAL HIGH (ref 0.44–1.00)
GFR, Estimated: 32 mL/min — ABNORMAL LOW (ref >60)
Glucose, Bld: 122 mg/dL — ABNORMAL HIGH (ref 70–99)
Potassium: 4.1 mmol/L (ref 3.5–5.1)
Sodium: 139 mmol/L (ref 135–145)

## 2020-11-05 LAB — CBC
HCT: 29 % — ABNORMAL LOW (ref 36.0–46.0)
Hemoglobin: 9.8 g/dL — ABNORMAL LOW (ref 12.0–15.0)
MCH: 30.6 pg (ref 26.0–34.0)
MCHC: 33.8 g/dL (ref 30.0–36.0)
MCV: 90.6 fL (ref 80.0–100.0)
Platelets: 223 10*3/uL (ref 150–400)
RBC: 3.2 MIL/uL — ABNORMAL LOW (ref 3.87–5.11)
RDW: 17.9 % — ABNORMAL HIGH (ref 11.5–15.5)
WBC: 17.5 10*3/uL — ABNORMAL HIGH (ref 4.0–10.5)
nRBC: 0.1 % (ref 0.0–0.2)

## 2020-11-05 LAB — BPAM RBC
Blood Product Expiration Date: 202207282359
Blood Product Expiration Date: 202207302359
ISSUE DATE / TIME: 202207141546
ISSUE DATE / TIME: 202207141835
Unit Type and Rh: 9500
Unit Type and Rh: 9500

## 2020-11-05 LAB — IRON AND TIBC
Iron: 38 ug/dL (ref 28–170)
Saturation Ratios: 24 % (ref 10.4–31.8)
TIBC: 156 ug/dL — ABNORMAL LOW (ref 250–450)
UIBC: 118 ug/dL

## 2020-11-05 LAB — TYPE AND SCREEN
ABO/RH(D): A POS
Antibody Screen: POSITIVE
Donor AG Type: NEGATIVE
Donor AG Type: NEGATIVE
Unit division: 0
Unit division: 0

## 2020-11-05 LAB — GLUCOSE, CAPILLARY
Glucose-Capillary: 109 mg/dL — ABNORMAL HIGH (ref 70–99)
Glucose-Capillary: 130 mg/dL — ABNORMAL HIGH (ref 70–99)
Glucose-Capillary: 132 mg/dL — ABNORMAL HIGH (ref 70–99)
Glucose-Capillary: 132 mg/dL — ABNORMAL HIGH (ref 70–99)
Glucose-Capillary: 135 mg/dL — ABNORMAL HIGH (ref 70–99)
Glucose-Capillary: 136 mg/dL — ABNORMAL HIGH (ref 70–99)

## 2020-11-05 LAB — HEPATIC FUNCTION PANEL
ALT: 16 U/L (ref 0–44)
AST: 39 U/L (ref 15–41)
Albumin: 2.2 g/dL — ABNORMAL LOW (ref 3.5–5.0)
Alkaline Phosphatase: 272 U/L — ABNORMAL HIGH (ref 38–126)
Bilirubin, Direct: 0.8 mg/dL — ABNORMAL HIGH (ref 0.0–0.2)
Indirect Bilirubin: 1.2 mg/dL — ABNORMAL HIGH (ref 0.3–0.9)
Total Bilirubin: 2 mg/dL — ABNORMAL HIGH (ref 0.3–1.2)
Total Protein: 5 g/dL — ABNORMAL LOW (ref 6.5–8.1)

## 2020-11-05 LAB — HEMOGLOBIN A1C
Hgb A1c MFr Bld: 4.5 % — ABNORMAL LOW (ref 4.8–5.6)
Mean Plasma Glucose: 82.45 mg/dL

## 2020-11-05 LAB — FERRITIN: Ferritin: 1381 ng/mL — ABNORMAL HIGH (ref 11–307)

## 2020-11-05 LAB — FIBRINOGEN: Fibrinogen: 358 mg/dL (ref 210–475)

## 2020-11-05 SURGERY — ESOPHAGOGASTRODUODENOSCOPY (EGD) WITH PROPOFOL
Anesthesia: Monitor Anesthesia Care

## 2020-11-05 SURGERY — CANCELLED PROCEDURE

## 2020-11-05 MED ORDER — PANTOPRAZOLE SODIUM 40 MG PO TBEC
40.0000 mg | DELAYED_RELEASE_TABLET | Freq: Every day | ORAL | Status: DC
  Administered 2020-11-05 – 2020-11-07 (×3): 40 mg via ORAL
  Filled 2020-11-05 (×3): qty 1

## 2020-11-05 MED ORDER — INSULIN ASPART 100 UNIT/ML IJ SOLN: 0.0000 [IU] | Freq: Every day | INTRAMUSCULAR | Status: DC

## 2020-11-05 MED ORDER — LACTATED RINGERS IV SOLN: INTRAVENOUS | Status: DC | PRN

## 2020-11-05 MED ORDER — SODIUM CHLORIDE 0.9 % IV SOLN: INTRAVENOUS | Status: DC

## 2020-11-05 MED ORDER — ACETAMINOPHEN 325 MG PO TABS
650.0000 mg | ORAL_TABLET | Freq: Four times a day (QID) | ORAL | Status: DC | PRN
  Administered 2020-11-05 – 2020-11-08 (×5): 650 mg via ORAL
  Filled 2020-11-05 (×5): qty 2

## 2020-11-05 MED ORDER — INSULIN ASPART 100 UNIT/ML IJ SOLN
0.0000 [IU] | Freq: Three times a day (TID) | INTRAMUSCULAR | Status: DC
  Administered 2020-11-05 – 2020-11-07 (×3): 2 [IU] via SUBCUTANEOUS
  Administered 2020-11-07: 3 [IU] via SUBCUTANEOUS
  Administered 2020-11-08 (×2): 2 [IU] via SUBCUTANEOUS

## 2020-11-05 MED ORDER — PHENYLEPHRINE 40 MCG/ML (10ML) SYRINGE FOR IV PUSH (FOR BLOOD PRESSURE SUPPORT)
PREFILLED_SYRINGE | INTRAVENOUS | Status: DC | PRN
  Administered 2020-11-05 (×2): 80 ug via INTRAVENOUS

## 2020-11-05 MED ORDER — PROPOFOL 500 MG/50ML IV EMUL
INTRAVENOUS | Status: DC | PRN
  Administered 2020-11-05 (×2): 30 mg via INTRAVENOUS

## 2020-11-05 SURGICAL SUPPLY — 15 items

## 2020-11-05 NOTE — Progress Notes (Addendum)
With 1 episode of dark red stool bowel movement during the shift. S/P 2 units of PRBC transfused yesterday. Covering provider updated. Latest Hemoglobin: 9.8  No urine output noted through out the shift, bladder scan residual at 6am: 252mL.

## 2020-11-05 NOTE — Transfer of Care (Signed)
Immediate Anesthesia Transfer of Care Note  Patient: Tonya [name]  Procedure(s) Performed: Procedure(s): ESOPHAGOGASTRODUODENOSCOPY (EGD) WITH PROPOFOL (N/A) BIOPSY  Patient Location: PACU and Endoscopy Unit  Anesthesia Type:MAC  Level of Consciousness: awake, alert  and oriented  Airway & Oxygen Therapy: Patient Spontanous Breathing and Patient connected to nasal cannula oxygen  Post-op Assessment: Report given to RN and Post -op Vital signs reviewed and stable  Post vital signs: Reviewed and stable  Last Vitals:  Vitals:   11/05/20 1402 11/05/20 1421  BP: 103/73 106/65  Pulse: (!) 117 (!) 112  Resp:  (!) 22  Temp: 36.8 C 36.7 C  SpO2: 505% 39%    Complications: No apparent anesthesia complications

## 2020-11-05 NOTE — Anesthesia Preprocedure Evaluation (Addendum)
Anesthesia Evaluation  Patient identified by MRN, date of birth, ID band Patient awake    Reviewed: Allergy & Precautions, NPO status , Patient's Chart, lab work & pertinent test results  History of Anesthesia Complications (+) PONV and history of anesthetic complications  Airway Mallampati: III  TM Distance: >3 FB Neck ROM: Full    Dental  (+) Edentulous Upper   Pulmonary COPD, former smoker,    Pulmonary exam normal        Cardiovascular hypertension,  Rhythm:Regular Rate:Tachycardia   '20 Myoperfusion- Lexiscan stress test was performed. Stress EKG is non-diagnostic, as this is pharmacological stress test. Myocardial pefusion imaging is normal. LVEF 65%. Low risk study.    Neuro/Psych CVA, Residual Symptoms negative psych ROS   GI/Hepatic Neg liver ROS, GERD  Medicated and Controlled, Colon cancer    Endo/Other  diabetes, Insulin Dependent  Renal/GU negative Renal ROS     Musculoskeletal negative musculoskeletal ROS (+)   Abdominal   Peds  Hematology negative hematology ROS (+)  On eliquis    Anesthesia Other Findings   Reproductive/Obstetrics                            Anesthesia Physical Anesthesia Plan  ASA: 3  Anesthesia Plan: MAC   Post-op Pain Management:    Induction:   PONV Risk Score and Plan: 3 and Propofol infusion and Treatment may vary due to age or medical condition  Airway Management Planned: Nasal Cannula and Natural Airway  Additional Equipment: None  Intra-op Plan:   Post-operative Plan:   Informed Consent: I have reviewed the patients History and Physical, chart, labs and discussed the procedure including the risks, benefits and alternatives for the proposed anesthesia with the patient or authorized representative who has indicated his/her understanding and acceptance.   Patient has DNR.  Discussed DNR with patient, Discussed DNR with power of  attorney and Suspend DNR.   Consent reviewed with POA  Plan Discussed with: CRNA and Anesthesiologist  Anesthesia Plan Comments:         Anesthesia Quick Evaluation

## 2020-11-05 NOTE — Anesthesia Postprocedure Evaluation (Signed)
Anesthesia Post Note  Patient: Tonya [name]  Procedure(s) Performed: ESOPHAGOGASTRODUODENOSCOPY (EGD) WITH PROPOFOL BIOPSY     Patient location during evaluation: PACU Anesthesia Type: MAC Level of consciousness: awake and alert Pain management: pain level controlled Vital Signs Assessment: post-procedure vital signs reviewed and stable Respiratory status: spontaneous breathing, nonlabored ventilation and respiratory function stable Cardiovascular status: stable, blood pressure returned to baseline and tachycardic Anesthetic complications: no   No notable events documented.  Last Vitals:  Vitals:   11/05/20 1502 11/05/20 1510  BP: (!) 121/57 131/84  Pulse: (!) 113   Resp: (!) 26 (!) 30  Temp: (!) 36.3 C   SpO2: 100%     Last Pain:  Vitals:   11/05/20 1520  TempSrc:   PainSc: 0-No pain                 Audry Pili

## 2020-11-05 NOTE — Op Note (Addendum)
[facility] Patient Name: Tonya [name] Procedure Date: 11/05/2020 MRN: [id number] Attending MD: Gatha Mayer , MD Date of Birth: October 28, 1940 CSN: [id number] Age: 80 Admit Type: Inpatient Procedure:                Upper GI endoscopy Indications:              Hematochezia, Melena Providers:                Gatha Mayer, MD, Kary Kos RN, RN, Terrall Laity, Laverda Sorenson, Technician, Eliberto Ivory,                            CRNA Referring MD:              Medicines:                Propofol per Anesthesia, Monitored Anesthesia Care Complications:            No immediate complications. Estimated Blood Loss:     Estimated blood loss was minimal. Procedure:                Pre-Anesthesia Assessment:                           - Prior to the procedure, a History and Physical                            was performed, and patient medications and                            allergies were reviewed. The patient's tolerance of                            previous anesthesia was also reviewed. The risks                            and benefits of the procedure and the sedation                            options and risks were discussed with the patient.                            All questions were answered, and informed consent                            was obtained. Prior Anticoagulants: The patient                            last took Eliquis (apixaban) 2 days prior to the                            procedure. ASA Grade Assessment: III - A patient  with severe systemic disease. After reviewing the                            risks and benefits, the patient was deemed in                            satisfactory condition to undergo the procedure.                           After obtaining informed consent, the endoscope was                            passed under direct vision. Throughout the                             procedure, the patient's blood pressure, pulse, and                            oxygen saturations were monitored continuously. The                            GIF-H190 ([id number] Olympus gastroscope was                            introduced through the mouth, and advanced to the                            second part of duodenum. The upper GI endoscopy was                            accomplished without difficulty. The patient                            tolerated the procedure well. Scope In: Scope Out: Findings:      Scattered mucosal changes characterized by ulceration were found in the       gastric antrum. Biopsies were taken with a cold forceps for histology.       Verification of patient identification for the specimen was done.       Estimated blood loss was minimal.      The exam was otherwise without abnormality.      The cardia and gastric fundus were normal on retroflexion. Impression:               - Ulcerated mucosa in the antrum. Biopsied.                           - The examination was otherwise normal. Moderate Sedation:      Not Applicable - Patient had care per Anesthesia. Recommendation:           - Return patient to hospital ward for ongoing care.                           - Soft diet.                           -  Await pathology results.                           - I BELIEVE THIS COULD HAVE BEEN SOURCE OF BLEEDING                            THOUGH SO COULD COLON CANCER                           I AM SOMEWHAT SUSPICIOUS OF EXCESSIVE EFFECTS OF                            ELIQUIS IN SETTING OF CKD - INR AND PTT SIG                            ELEVATED DESPITE NO ELIQUIS IN 2 DAYS - ADMITTEDLY                            AN BE DIFFICULT TO INTERPRET COAG STUDIES WHEN ON                            DOAC BUT SOMETHING DOES NOT SEEM RIGHT                           1) PANTOPRAZOLE 40 MG DAILY                           2) RECHECK PT/INR) AND PTT IN AM                            3) NO ELIQUIS RIGHT NIOW AND FOR NEXT SEVERAL DAYS                            AT LEAST                           4) IF IT IS OK TO RESTART ELIQUIS AT A DIFFERENT                            DOSE, ETC CAN DO WHEN APPROPRIATE (WILL GET                            PHARMACY INPUT)                           5) I WILL F/U PATH                           6) GI WILL BE AVAILABLE IF ? BUT WILL NOT ROUND                            (DR. HUNG COVERING)  7) DEPENDING UPON CLINICAL COURSE DC TOMORROW AT                            EARLIEST                           8) DISCUSSED WITH FAMILY                           FURTHER COMUNICATION WITH TRH - INCREASING LIVER                            INVOLVEMENT 3/21 CT - NO LFT YET TODAY - ANOTHER                            (MORE LIKELY) ISSUE IS LIVER FAILURE DRIVING                            COAGULOPATHY + BLEEDING - WILL CHECK LFT AND                            FIBRINOGEN AND I ADVISE HEM-ONC CONSULT TO HELP                            PLAN TREATMENT AND GOALS OF CARE Procedure Code(s):        --- Professional ---                           [id number], Esophagogastroduodenoscopy, flexible,                            transoral; with biopsy, single or multiple Diagnosis Code(s):        --- Professional ---                           K25.9, Gastric ulcer, unspecified as acute or                            chronic, without hemorrhage or perforation                           K92.1, Melena (includes Hematochezia) CPT copyright 2019 American [employer] Association. All rights reserved. The codes documented in this report are preliminary and upon coder review may  be revised to meet current compliance requirements. Gatha Mayer, MD 11/05/2020 3:09:12 PM This report has been signed electronically. Number of Addenda: 0

## 2020-11-05 NOTE — Progress Notes (Addendum)
PROGRESS NOTE    ROSSIE SCARFONE  OHY:[id number] DOB: 1940/12/12 DOA: 11/04/2020 PCP: Leeroy Cha, MD   Brief Narrative:   Tonya [name] is a 80 y.o. female with history of stage IV adenocarcinoma of the colon with history of DVT on Eliquis, hypertension, diabetes mellitus was brought to the ER after patient had rectal bleeding.  As per the patient's daughter who provided the history patient had minimal bleeding last night at home but this morning had a lot of bleeding and was brought to the ER.  Denies any vomiting.  Was able to eat.  Had nonspecific abdominal discomfort.  She gets blood transfusion and iron infusion for chemo induced anemia.  She took Eliquis about 3 days ago as she ran out of it.  ED course: Patient was initially hypotensive which improved with IV bolus.  Hemoglobin 7.6, was 8 in June 21.  Eliquis was hold.  Patient was admitted for further evaluation of GI bleed in the setting of Eliquis and stage IV colon cancer.  Patient's daughter requested GI evaluation.   Assessment & Plan:  Acute GI bleed: -In the setting of Eliquis and history of underlying stage IV adenocarcinoma of colon. -Status post 2 unit PRBC transfusion. -No further episodes of melena this morning.  Hemoglobin is stable at 9.8.  Continue to monitor H&H closely.  Continue to hold Eliquis. -Consult GI for further evaluation.  History of iron deficiency anemia: -Gets iron infusion outpatient -We will check iron panel.  AKI: -Slightly increased in creatinine from 1.49-1.61 this morning.  GFR declined from 35-32. -Continue IV hydration.  Avoid nephrotoxic medication.  Repeat BMP tomorrow a.m.  Stage IV adenocarcinoma of colon: -Followed by heme oncology Dr. Marin Olp. -As per daughter: Patient was advised no further chemotherapy.  She has port.  Hypertension: -Blood pressure is on lower side.  Continue IV hydration.  Monitor blood pressure closely.  Hold metoprolol for now. -Resume  metoprolol once blood pressure is back to baseline.  History of left leg DVT on Eliquis: -Hold Eliquis for now  Type 2 diabetes mellitus: -Start on sliding scale insulin -Monitor blood sugar closely.  History of CVA with left-sided hemiparesis: Aware  ADDENDUM: GI recommended hem-oncology consult-concern for coagulopathy issues in the setting of metastasis in the liver.  DVT prophylaxis: SCD Code Status: DNR Family Communication: Patient's daughter and granddaughter present at bedside.  Plan of care discussed with patient in length and he verbalized understanding and agreed with it. Disposition Plan: To be determined  Consultants:  GI Hem-oncology  Procedures:  None  Antimicrobials:  None  Status is: Observation   Dispo: The patient is from: Home              Anticipated d/c is to: Home              Patient currently is not medically stable to d/c.   Difficult to place patient No         Subjective: Patient seen and examined.  Daughter and granddaughter at bedside.  No further episodes of dark-colored stool this morning.  No vomiting, abdominal pain, chest pain or shortness of breath.  No acute events overnight reported.  Objective: Vitals:   11/04/20 2014 11/04/20 2120 11/05/20 0009 11/05/20 0450  BP: 134/72 140/83 109/71 102/72  Pulse: 82 97 95 (!) 109  Resp: 20 19 20 20   Temp: (!) 97.5 F (36.4 C) 97.6 F (36.4 C) 97.6 F (36.4 C) 98 F (36.7 C)  TempSrc: Oral Oral Oral  Oral  SpO2: 99% 92% 100% 97%  Weight:      Height:        Intake/Output Summary (Last 24 hours) at 11/05/2020 1026 Last data filed at 11/05/2020 0600 Gross per 24 hour  Intake 778 ml  Output 0 ml  Net 778 ml   Filed Weights   11/04/20 1220  Weight: 73 kg    Examination:  General exam: Appears calm and comfortable, appears weak, pale Respiratory system: Clear to auscultation. Respiratory effort normal. Cardiovascular system: S1 & S2 heard, RRR. No JVD, murmurs, rubs,  gallops or clicks. No pedal edema.  Port noted on right-side of chest Gastrointestinal system: Abdomen is nondistended, soft and nontender. No organomegaly or masses felt. Normal bowel sounds heard. Central nervous system: Alert and oriented.  Communicating and answers appropriately Extremities: Symmetric 5 x 5 power. Skin: No rashes, lesions or ulcers    Data Reviewed: I have personally reviewed following labs and imaging studies  CBC: Recent Labs  Lab 11/04/20 1252 11/05/20 0452  WBC 13.5* 17.5*  NEUTROABS 11.1*  --   HGB 7.6* 9.8*  HCT 24.8* 29.0*  MCV 95.4 90.6  PLT 247 591   Basic Metabolic Panel: Recent Labs  Lab 11/04/20 1252 11/05/20 0452  NA 140 139  K 4.0 4.1  CL 102 101  CO2 28 28  GLUCOSE 149* 122*  BUN 30* 32*  CREATININE 1.49* 1.61*  CALCIUM 8.7* 8.4*   GFR: Estimated Creatinine Clearance: 30.1 mL/min (A) (by C-G formula based on SCr of 1.61 mg/dL (H)). Liver Function Tests: Recent Labs  Lab 11/04/20 1252  AST 40  ALT 16  ALKPHOS 261*  BILITOT 1.0  PROT 4.8*  ALBUMIN 2.1*   No results for input(s): LIPASE, AMYLASE in the last 168 hours. No results for input(s): AMMONIA in the last 168 hours. Coagulation Profile: Recent Labs  Lab 11/04/20 1252  INR 2.3*   Cardiac Enzymes: No results for input(s): CKTOTAL, CKMB, CKMBINDEX, TROPONINI in the last 168 hours. BNP (last 3 results) No results for input(s): PROBNP in the last 8760 hours. HbA1C: No results for input(s): HGBA1C in the last 72 hours. CBG: Recent Labs  Lab 11/04/20 1656 11/04/20 2012 11/05/20 0006 11/05/20 0310 11/05/20 0805  GLUCAP 112* 126* 132* 109* 135*   Lipid Profile: No results for input(s): CHOL, HDL, LDLCALC, TRIG, CHOLHDL, LDLDIRECT in the last 72 hours. Thyroid Function Tests: No results for input(s): TSH, T4TOTAL, FREET4, T3FREE, THYROIDAB in the last 72 hours. Anemia Panel: No results for input(s): VITAMINB12, FOLATE, FERRITIN, TIBC, IRON, RETICCTPCT in the  last 72 hours. Sepsis Labs: No results for input(s): PROCALCITON, LATICACIDVEN in the last 168 hours.  Recent Results (from the past 240 hour(s))  SARS CORONAVIRUS 2 (TAT 6-24 HRS) Nasopharyngeal Nasopharyngeal Swab     Status: None   Collection Time: 11/04/20  2:21 PM   Specimen: Nasopharyngeal Swab  Result Value Ref Range Status   SARS Coronavirus 2 NEGATIVE NEGATIVE Final    Comment: (NOTE) SARS-CoV-2 target nucleic acids are NOT DETECTED.  The SARS-CoV-2 RNA is generally detectable in upper and lower respiratory specimens during the acute phase of infection. Negative results do not preclude SARS-CoV-2 infection, do not rule out co-infections with other pathogens, and should not be used as the sole basis for treatment or other patient management decisions. Negative results must be combined with clinical observations, patient history, and epidemiological information. The expected result is Negative.  Fact Sheet for Patients: SugarRoll.be  Fact Sheet for Healthcare Providers:  https://www.woods-mathews.com/  This test is not yet approved or cleared by the [place] and  has been authorized for detection and/or diagnosis of SARS-CoV-2 by FDA under an Emergency Use Authorization (EUA). This EUA will remain  in effect (meaning this test can be used) for the duration of the COVID-19 declaration under Se ction 564(b)(1) of the Act, 21 U.S.C. section 360bbb-3(b)(1), unless the authorization is terminated or revoked sooner.  Performed at [facility], [place] [address], [place], [place] [address]       Radiology Studies: DG Chest Port 1 View  Result Date: 11/04/2020 CLINICAL DATA:  The patient reports right red blood per rectum this morning EXAM: PORTABLE CHEST 1 VIEW COMPARISON:  PA and lateral chest 12/02/2013. CT chest, abdomen and pelvis 07/08/2020. FINDINGS: Right Port-A-Cath is in place. The patient has moderate right and  small left pleural effusions with associated basilar airspace disease. Aortic atherosclerosis is noted. Heart size is normal. No acute or focal bony abnormality. IMPRESSION: Moderate right and small left pleural effusions and basilar airspace disease are new since the most recent exam. Aortic Atherosclerosis (ICD10-I70.0). Electronically Signed   By: Inge Rise M.D.   On: 11/04/2020 14:22    Scheduled Meds:  Chlorhexidine Gluconate Cloth  6 each Topical Daily   NONFORMULARY OR COMPOUNDED ITEM 1 application  1 application Topical QHS   sodium chloride flush  10-40 mL Intracatheter Q12H   Continuous Infusions:   LOS: 0 days   Time spent: 40 minutes   Caius Silbernagel Loann Quill, MD [facility]  If 7PM-7AM, please contact night-coverage [email or web address] 11/05/2020, 10:26 AM

## 2020-11-05 NOTE — Consult Note (Addendum)
Consultation  Referring Provider: TRH/ Pahwani R Primary Care Physician:  Leeroy Cha, MD Primary Gastroenterologist:  Dr.Gupta  Reason for Consultation:  GI  Bleeding  HPI: Tonya [name] is a 80 y.o. female, known to Dr. Lyndel Safe from colonoscopy done in June 2020 for iron deficiency anemia at which time she was diagnosed with adenocarcinoma of the ascending colon.  She was found to have metastatic disease at the time of diagnosis and has been followed by Dr. Marin Olp.  Other issues have included hypertension, diabetes mellitus and prior history of DVT.  She does have an IVC filter in place. She was recently diagnosed with a new DVT in the left lower extremity in June and has just started back on Eliquis over the past month. Patient had not been tolerating chemo well and has stopped as of June 2022.  Per the notes patient has been mostly bedbound over the past several months. Last CT imaging March 2022 shows a stable small nodule in the right upper lobe, she has bulky hepatic metastatic disease increased over previous imaging and near complete infiltration of the right hemiliver.  Also worsening disease about the right colon with increased thickening of the colon increased pericolonic nodularity and stranding and signs of peritoneal disease including deep pelvic implants that extend adjacent to the rectum.  There are some mixed density in the IVC, may reflect clot below the patient's IVC filter, stranding about the right ureter.  Family relates that patient did have some dark stools a couple of months ago and did require transfusion.  Recently she has been having normal-appearing bowel movements up until the past couple of days, when family noted red blood mixed in with the bowel movement.  She had 1 episode of more obviously bloody stool yesterday, and had another stool with blood last night after admission.  She has not had any further bowel movements this morning. Patient has been  having ongoing abdominal pain primarily right-sided and also has pain from sacral decubitus.  Family reports that she has a good appetite and has been eating without difficulty.  She did have some nausea and vomiting earlier in the week without any evidence of blood or coffee-ground appearing material.  Eliquis had been stopped about 2 days prior to admission as they had run out.  Baseline hemoglobin has been around 8. On admission last evening blood pressure systolic was in the 70J, hemoglobin 7.6 hematocrit 24.8, INR 2.3  Has been transfused 2 units of packed RBCs and hemoglobin is 9.8 this morning.  Family relates that they are on palliative care but not hospice and still want their mother to have medical treatment as needed.  Patient is alert, and on discussion about possible upper endoscopy she relates that she would like to have this done if it would exclude bleeding from her upper GI.   Past Medical History:  Diagnosis Date  . Cerebrovascular accident [facility]) 2001   left side weakness  . Chronic edema    ? venous insufficiency  . Colon cancer ([place]) 2020  . COPD (chronic obstructive pulmonary disease) ([place])    never had a problem breathing  . Diabetes mellitus without complication ([place])   . Dyspnea   . Enlarged heart    per pt  . Gallstones   . GERD (gastroesophageal reflux disease)   . Goals of care, counseling/discussion 11/01/2018  . History of radiation therapy 04/08/2020-05/21/2020   Colon;Dr. Gery Pray  . Hyperlipidemia   . Hypertension   .  PONV (postoperative nausea and vomiting)    nausea     Past Surgical History:  Procedure Laterality Date  . ABDOMINAL HYSTERECTOMY    . BIOPSY  10/18/2018   Procedure: BIOPSY;  Surgeon: Jackquline Denmark, MD;  Location: [facility] ENDOSCOPY;  Service: Endoscopy;;  . CHOLECYSTECTOMY    . COLONOSCOPY WITH PROPOFOL N/A 10/18/2018   Procedure: COLONOSCOPY WITH PROPOFOL;  Surgeon: Jackquline Denmark, MD;  Location: [facility] ENDOSCOPY;  Service:  Endoscopy;  Laterality: N/A;  . IR IMAGING GUIDED PORT INSERTION  11/11/2018  . IR IVC FILTER PLMT / S&I /IMG GUID/MOD SED  02/11/2020  . POLYPECTOMY  10/18/2018   Procedure: POLYPECTOMY;  Surgeon: Jackquline Denmark, MD;  Location: [facility] ENDOSCOPY;  Service: Endoscopy;;  . SUBMUCOSAL TATTOO INJECTION  10/18/2018   Procedure: SUBMUCOSAL TATTOO INJECTION;  Surgeon: Jackquline Denmark, MD;  Location: [facility] ENDOSCOPY;  Service: Endoscopy;;    Prior to Admission medications   Medication Sig Start Date End Date Taking? Authorizing Provider  acetaminophen (TYLENOL) 500 MG tablet Take 500 mg by mouth every 6 (six) hours as needed for moderate pain.   Yes [provider]  famotidine (PEPCID) 40 MG tablet TAKE 1 TABLET TWICE DAILY Patient taking differently: Take 40 mg by mouth 2 (two) times daily. 08/24/20  Yes Ennever, Rudell Cobb, MD  insulin degludec (TRESIBA FLEXTOUCH) 100 UNIT/ML FlexTouch Pen Inject 0.21 mLs (21 Units total) into the skin daily. Take 34 units on chemo days Patient taking differently: Inject 0-8 Units into the skin See admin instructions. Sliding scale 09/19/19  Yes Renato Shin, MD  ketoconazole (NIZORAL) 2 % cream Apply 1 application topically 2 (two) times daily. Patient taking differently: Apply 1 application topically daily as needed for irritation. 02/09/20  Yes McDonald, Stephan Minister, DPM  lidocaine-prilocaine (EMLA) cream Apply 1 application topically as needed for up to 30 doses. Patient taking differently: Apply 1 application topically daily as needed (port access). 09/01/20  Yes Ennever, Rudell Cobb, MD  meclizine (ANTIVERT) 12.5 MG tablet TAKE 1 TABLET BY MOUTH THREE TIMES DAILY AS NEEDED FOR DIZZINESS Patient taking differently: Take 12.5 mg by mouth 3 (three) times daily as needed for dizziness or nausea. 09/27/20  Yes [name], MD  metoprolol succinate (TOPROL-XL) 25 MG 24 hr tablet 25 mg daily. 01/02/20  Yes [provider]  naproxen sodium (ALEVE) 220 MG tablet Take 440 mg by  mouth daily as needed (pain).   Yes [provider]  ondansetron (ZOFRAN) 8 MG tablet TAKE 1 TABLET(8 MG) BY MOUTH EVERY 8 HOURS AS NEEDED FOR NAUSEA OR VOMITING Patient taking differently: Take 8 mg by mouth every 8 (eight) hours as needed. 10/15/20  Yes Ennever, Rudell Cobb, MD  prochlorperazine (COMPAZINE) 10 MG tablet TAKE 1 TABLET(10 MG) BY MOUTH EVERY 6 HOURS AS NEEDED FOR NAUSEA OR VOMITING Patient taking differently: Take 10 mg by mouth every 8 (eight) hours as needed for nausea or vomiting. 08/23/20  Yes [name], MD  torsemide (DEMADEX) 20 MG tablet Take 1 tablet (20 mg total) by mouth daily. With addition doses as needed. Patient taking differently: Take 20 mg by mouth daily. 09/09/20 01/07/21 Yes Cantwell, Celeste C, PA-C  Alcohol Swabs (B-D SINGLE USE SWABS REGULAR) PADS 1 each by Does not apply route as needed (to cleanse site prior to obtaining droplet of blood for CBG's). E11.9 07/08/19   Renato Shin, MD  B-D ULTRAFINE III SHORT PEN 31G X 8 MM MISC  09/01/19   [provider]  Arne Cleveland 5  MG TABS tablet TAKE 1 TABLET(5 MG) BY MOUTH TWICE DAILY Patient taking differently: Take 5 mg by mouth 2 (two) times daily. 08/18/20   [name], MD  magic mouthwash SOLN Swish and swallow equal parts Benadryl, Lidocaine and Nystatin.  5- 10 ml.'s four times a day as needed. Patient not taking: No sig reported 05/27/19   [name], MD  traMADol (ULTRAM) 50 MG tablet TAKE 1 TABLET(50 MG) BY MOUTH EVERY 6 HOURS AS NEEDED Patient not taking: No sig reported 08/31/20   [name], MD  TRUE METRIX BLOOD GLUCOSE TEST test strip TEST BLOOD SUGAR TWICE DAILY 01/16/20   Renato Shin, MD  TRUEplus Lancets 30G MISC 1 each by Does not apply route 2 (two) times daily. E11.9 07/08/19   Renato Shin, MD    Current Facility-Administered Medications  Medication Dose Route Frequency Provider Last Rate Last Admin  . 0.9 %  sodium chloride infusion   Intravenous Continuous Pahwani,  Rinka R, MD 75 mL/hr at 11/05/20 1055 New Bag at 11/05/20 1055  . acetaminophen (TYLENOL) tablet 650 mg  650 mg Oral Q6H PRN Pahwani, Rinka R, MD   650 mg at 11/05/20 1041  . Chlorhexidine Gluconate Cloth 2 % PADS 6 each  6 each Topical Daily Rise Patience, MD   6 each at 11/05/20 1015  . insulin aspart (novoLOG) injection 0-15 Units  0-15 Units Subcutaneous TID WC Pahwani, Rinka R, MD      . insulin aspart (novoLOG) injection 0-5 Units  0-5 Units Subcutaneous QHS Pahwani, Rinka R, MD      . NONFORMULARY OR COMPOUNDED ITEM 1 application  1 application Topical QHS Rise Patience, MD   1 application at 09/38/18 2123  . sodium chloride flush (NS) 0.9 % injection 10-40 mL  10-40 mL Intracatheter Q12H Rise Patience, MD   10 mL at 11/05/20 1015    Allergies as of 11/04/2020 - Review Complete 11/04/2020  Allergen Reaction Noted  . Dilaudid [hydromorphone]  11/04/2020  . Hydrocodone Swelling and Other (See Comments) 04/30/2008  . Iohexol Hives, Shortness Of Breath, and Other (See Comments) 11/15/2006  . Morphine and related Other (See Comments) 11/04/2020  . Naproxen Other (See Comments) 04/30/2008  . Other Other (See Comments) 09/22/2020  . Ibuprofen Nausea Only 10/15/2018  . Propoxyphene n-acetaminophen Other (See Comments) 04/30/2008    Family History  Problem Relation Age of Onset  . Cirrhosis Mother   . Heart disease Father   . Cancer Other   . Hypertension Other   . Diabetes Other     Social History   Socioeconomic History  . Marital status: Widowed    Spouse name: Not on file  . Number of children: 5  . Years of education: 9  . Highest education level: 9th grade  Occupational History  . Occupation: Retired  Tobacco Use  . Smoking status: Former    Packs/day: 0.25    Types: Cigarettes    Quit date: 07/23/1968    Years since quitting: 52.3  . Smokeless tobacco: Never  . Tobacco comments:    6 MONTH  Vaping Use  . Vaping Use: Never used  Substance and  Sexual Activity  . Alcohol use: No    Alcohol/week: 0.0 standard drinks  . Drug use: No  . Sexual activity: Not Currently  Other Topics Concern  . Not on file  Social History Narrative  . Not on file   Social Determinants of Health   Financial Resource  Strain: Low Risk   . Difficulty of Paying Living Expenses: Not hard at all  Food Insecurity: No Food Insecurity  . Worried About Charity fundraiser in the Last Year: Never true  . Ran Out of Food in the Last Year: Never true  Transportation Needs: No Transportation Needs  . Lack of Transportation (Medical): No  . Lack of Transportation (Non-Medical): No  Physical Activity: Inactive  . Days of Exercise per Week: 0 days  . Minutes of Exercise per Session: 0 min  Stress: No Stress Concern Present  . Feeling of Stress : Not at all  Social Connections: Moderately Isolated  . Frequency of Communication with Friends and Family: More than three times a week  . Frequency of Social Gatherings with Friends and Family: More than three times a week  . Attends Religious Services: More than 4 times per year  . Active Member of Clubs or Organizations: No  . Attends Archivist Meetings: Never  . Marital Status: Widowed  Intimate Partner Violence: Not At Risk  . Fear of Current or Ex-Partner: No  . Emotionally Abused: No  . Physically Abused: No  . Sexually Abused: No    Review of Systems: Pertinent positive and negative review of systems were noted in the above HPI section.  All other review of systems was otherwise negative.   Physical Exam: Vital signs in last 24 hours: Temp:  [97.5 F (36.4 C)-98 F (36.7 C)] 98 F (36.7 C) (07/15 0450) Pulse Rate:  [82-111] 109 (07/15 0450) Resp:  [16-20] 20 (07/15 0450) BP: (83-140)/(61-91) 102/72 (07/15 0450) SpO2:  [92 %-100 %] 97 % (07/15 0450) Weight:  [73 kg] 73 kg (07/14 1220) Last BM Date: 11/04/20 General:   Alert,  Well-developed, chronically ill-appearing elderly  African-American female pleasant and cooperative in NAD.  Daughters at bedside Head:  Normocephalic and atraumatic. Eyes:  Sclera clear, no icterus.   Conjunctiva pink. Ears:  Normal auditory acuity. Nose:  No deformity, discharge,  or lesions. Mouth:  No deformity or lesions.   Neck:  Supple; no masses or thyromegaly. Lungs:  Clear throughout to auscultation.   No wheezes, crackles, or rhonchi.  Heart:  Regular rate and rhythm; no murmurs, clicks, rubs,  or gallops. Abdomen:  Soft, there is tenderness in the epigastrium and throughout the right abdomen with a very large hard liver extending down below the umbilicus ,BS active, lateral flank edema Rectal: Not done Msk:  Symmetrical without gross deformities. . Pulses:  Normal pulses noted. Extremities: 1+ edema bilateral ankles Neurologic:  Alert and  oriented x4;  grossly normal neurologically. Skin:  Intact without significant lesions or rashes.. Psych:  Alert and cooperative. Normal mood and affect.  Intake/Output from previous day: 07/14 0701 - 07/15 0700 In: 778 [P.O.:50; Blood:728] Out: 0  Intake/Output this shift: No intake/output data recorded.  Lab Results: Recent Labs    11/04/20 1252 11/05/20 0452  WBC 13.5* 17.5*  HGB 7.6* 9.8*  HCT 24.8* 29.0*  PLT 247 223   BMET Recent Labs    11/04/20 1252 11/05/20 0452  NA 140 139  K 4.0 4.1  CL 102 101  CO2 28 28  GLUCOSE 149* 122*  BUN 30* 32*  CREATININE 1.49* 1.61*  CALCIUM 8.7* 8.4*   LFT Recent Labs    11/04/20 1252  PROT 4.8*  ALBUMIN 2.1*  AST 40  ALT 16  ALKPHOS 261*  BILITOT 1.0   PT/INR Recent Labs    11/04/20 1252  LABPROT 25.5*  INR 2.3*   Hepatitis Panel No results for input(s): HEPBSAG, HCVAB, HEPAIGM, HEPBIGM in the last 72 hours.    IMPRESSION:   #48 80 year old female with stage IV adenocarcinoma of the colon, with most recent CT imaging March 2022 showing increase in bulky hepatic metastatic disease with near complete  infiltration of the right hemiliver, and increase in size of the colon lesion with associated increase in pericolonic nodularity and signs of peritoneal disease Patient was recently diagnosed with a recurrent left lower extremity DVT and started Eliquis a few weeks ago, and now presents with rectal bleeding onset 2 days ago. She had had some dark stools in the recent past which resolved and family reports dark blood mixed with bowel movement over the past couple of days. She has been chronically anemic with baseline hemoglobin in the 8 range and just has had a mild drift.  Eliquis on hold over the past 3 days  Suspect her bleeding is secondary to the known worsening ascending colon malignancy, in setting of Eliquis cannot rule out upper GI source though less likely.    #2 anemia chronic secondary to above, stable post transfusions x2 #3 previously placed IVC filter #4 diabetes mellitus #5 coagulopathy secondary to Eliquis, and/or hepatic failure secondary to bulky metastatic disease  PLAN: Keep n.p.o. Patient will be scheduled for EGD with Dr. Carlean Purl this afternoon. Serial hemoglobins and transfuse as indicated I discussed with the family that bleeding is likely from the known colon tumor, and they are aware that there is not any endoscopic therapy that will help with bleeding from the colon malignancy. Expect she will need to stay off Eliquis but will defer to medicine team/oncology   Kashmir Lysaght PA-C 11/05/2020, 11:00 AM     [facility] Attending   I have taken an interval history, reviewed the chart and examined the patient. I agree with the Advanced Practitioner's note, impression and recommendations.  Majority the medical decision-making in the formulation of the assessment and plan were performed by me.   Proceed with EGD to see if upper GI bleed.  If negative would make sense to involve hospice if family and patient accept.  The risks and benefits as well as alternatives of  endoscopic procedure(s) have been discussed and reviewed. All questions answered. The patient agrees to proceed.  Gatha Mayer, MD, [facility] 11/05/2020 2:38 PM

## 2020-11-05 NOTE — Progress Notes (Signed)
[facility]) Hospital Liaison note:  This patient is currently enrolled in [facility] outpatient-based Palliative Care. Will continue to follow for disposition.  Please call with any outpatient palliative questions or concerns.  Thank you, Lorelee Market, LPN [facility] Liaison [phone]

## 2020-11-06 DIAGNOSIS — J90 Pleural effusion, not elsewhere classified: Secondary | ICD-10-CM | POA: Diagnosis present

## 2020-11-06 DIAGNOSIS — J449 Chronic obstructive pulmonary disease, unspecified: Secondary | ICD-10-CM | POA: Diagnosis present

## 2020-11-06 DIAGNOSIS — D689 Coagulation defect, unspecified: Secondary | ICD-10-CM | POA: Diagnosis present

## 2020-11-06 DIAGNOSIS — I69354 Hemiplegia and hemiparesis following cerebral infarction affecting left non-dominant side: Secondary | ICD-10-CM | POA: Diagnosis not present

## 2020-11-06 DIAGNOSIS — N179 Acute kidney failure, unspecified: Secondary | ICD-10-CM | POA: Diagnosis present

## 2020-11-06 DIAGNOSIS — K922 Gastrointestinal hemorrhage, unspecified: Secondary | ICD-10-CM | POA: Diagnosis present

## 2020-11-06 DIAGNOSIS — D500 Iron deficiency anemia secondary to blood loss (chronic): Secondary | ICD-10-CM | POA: Diagnosis present

## 2020-11-06 DIAGNOSIS — I959 Hypotension, unspecified: Secondary | ICD-10-CM | POA: Diagnosis present

## 2020-11-06 DIAGNOSIS — I749 Embolism and thrombosis of unspecified artery: Secondary | ICD-10-CM | POA: Diagnosis present

## 2020-11-06 DIAGNOSIS — D509 Iron deficiency anemia, unspecified: Secondary | ICD-10-CM | POA: Diagnosis present

## 2020-11-06 DIAGNOSIS — K259 Gastric ulcer, unspecified as acute or chronic, without hemorrhage or perforation: Secondary | ICD-10-CM | POA: Diagnosis present

## 2020-11-06 DIAGNOSIS — Z515 Encounter for palliative care: Secondary | ICD-10-CM | POA: Diagnosis not present

## 2020-11-06 DIAGNOSIS — E119 Type 2 diabetes mellitus without complications: Secondary | ICD-10-CM | POA: Diagnosis present

## 2020-11-06 DIAGNOSIS — I119 Hypertensive heart disease without heart failure: Secondary | ICD-10-CM | POA: Diagnosis present

## 2020-11-06 DIAGNOSIS — K7290 Hepatic failure, unspecified without coma: Secondary | ICD-10-CM | POA: Diagnosis present

## 2020-11-06 DIAGNOSIS — K279 Peptic ulcer, site unspecified, unspecified as acute or chronic, without hemorrhage or perforation: Secondary | ICD-10-CM | POA: Diagnosis not present

## 2020-11-06 DIAGNOSIS — D6832 Hemorrhagic disorder due to extrinsic circulating anticoagulants: Secondary | ICD-10-CM | POA: Diagnosis present

## 2020-11-06 DIAGNOSIS — Z20822 Contact with and (suspected) exposure to covid-19: Secondary | ICD-10-CM | POA: Diagnosis present

## 2020-11-06 DIAGNOSIS — I700 Atherosclerosis of aorta: Secondary | ICD-10-CM | POA: Diagnosis present

## 2020-11-06 DIAGNOSIS — Z7189 Other specified counseling: Secondary | ICD-10-CM | POA: Diagnosis not present

## 2020-11-06 DIAGNOSIS — C182 Malignant neoplasm of ascending colon: Secondary | ICD-10-CM | POA: Diagnosis present

## 2020-11-06 DIAGNOSIS — C19 Malignant neoplasm of rectosigmoid junction: Secondary | ICD-10-CM | POA: Diagnosis present

## 2020-11-06 DIAGNOSIS — K254 Chronic or unspecified gastric ulcer with hemorrhage: Secondary | ICD-10-CM | POA: Diagnosis present

## 2020-11-06 DIAGNOSIS — Z95828 Presence of other vascular implants and grafts: Secondary | ICD-10-CM | POA: Diagnosis not present

## 2020-11-06 DIAGNOSIS — C787 Secondary malignant neoplasm of liver and intrahepatic bile duct: Secondary | ICD-10-CM | POA: Diagnosis present

## 2020-11-06 DIAGNOSIS — Z66 Do not resuscitate: Secondary | ICD-10-CM | POA: Diagnosis present

## 2020-11-06 DIAGNOSIS — C189 Malignant neoplasm of colon, unspecified: Secondary | ICD-10-CM | POA: Diagnosis not present

## 2020-11-06 DIAGNOSIS — D630 Anemia in neoplastic disease: Secondary | ICD-10-CM | POA: Diagnosis present

## 2020-11-06 LAB — CBC WITH DIFFERENTIAL/PLATELET
Abs Immature Granulocytes: 0.07 10*3/uL (ref 0.00–0.07)
Basophils Absolute: 0 10*3/uL (ref 0.0–0.1)
Basophils Relative: 0 %
Eosinophils Absolute: 0 10*3/uL (ref 0.0–0.5)
Eosinophils Relative: 0 %
HCT: 25.9 % — ABNORMAL LOW (ref 36.0–46.0)
Hemoglobin: 8.3 g/dL — ABNORMAL LOW (ref 12.0–15.0)
Immature Granulocytes: 1 %
Lymphocytes Relative: 5 %
Lymphs Abs: 0.6 10*3/uL — ABNORMAL LOW (ref 0.7–4.0)
MCH: 30.1 pg (ref 26.0–34.0)
MCHC: 32 g/dL (ref 30.0–36.0)
MCV: 93.8 fL (ref 80.0–100.0)
Monocytes Absolute: 1.9 10*3/uL — ABNORMAL HIGH (ref 0.1–1.0)
Monocytes Relative: 14 %
Neutro Abs: 11.2 10*3/uL — ABNORMAL HIGH (ref 1.7–7.7)
Neutrophils Relative %: 80 %
Platelets: 195 10*3/uL (ref 150–400)
RBC: 2.76 MIL/uL — ABNORMAL LOW (ref 3.87–5.11)
RDW: 18.7 % — ABNORMAL HIGH (ref 11.5–15.5)
WBC: 13.9 10*3/uL — ABNORMAL HIGH (ref 4.0–10.5)
nRBC: 0 % (ref 0.0–0.2)

## 2020-11-06 LAB — BASIC METABOLIC PANEL
Anion gap: 7 (ref 5–15)
BUN: 36 mg/dL — ABNORMAL HIGH (ref 8–23)
CO2: 29 mmol/L (ref 22–32)
Calcium: 8.2 mg/dL — ABNORMAL LOW (ref 8.9–10.3)
Chloride: 103 mmol/L (ref 98–111)
Creatinine, Ser: 1.92 mg/dL — ABNORMAL HIGH (ref 0.44–1.00)
GFR, Estimated: 26 mL/min — ABNORMAL LOW (ref >60)
Glucose, Bld: 130 mg/dL — ABNORMAL HIGH (ref 70–99)
Potassium: 3.8 mmol/L (ref 3.5–5.1)
Sodium: 139 mmol/L (ref 135–145)

## 2020-11-06 LAB — CBC
HCT: 26.3 % — ABNORMAL LOW (ref 36.0–46.0)
HCT: 25.8 % — ABNORMAL LOW (ref 36.0–46.0)
Hemoglobin: 8.6 g/dL — ABNORMAL LOW (ref 12.0–15.0)
Hemoglobin: 8.2 g/dL — ABNORMAL LOW (ref 12.0–15.0)
MCH: 30.5 pg (ref 26.0–34.0)
MCH: 30.1 pg (ref 26.0–34.0)
MCHC: 32.7 g/dL (ref 30.0–36.0)
MCHC: 31.8 g/dL (ref 30.0–36.0)
MCV: 93.3 fL (ref 80.0–100.0)
MCV: 94.9 fL (ref 80.0–100.0)
Platelets: 207 10*3/uL (ref 150–400)
Platelets: 182 10*3/uL (ref 150–400)
RBC: 2.82 MIL/uL — ABNORMAL LOW (ref 3.87–5.11)
RBC: 2.72 MIL/uL — ABNORMAL LOW (ref 3.87–5.11)
RDW: 18.5 % — ABNORMAL HIGH (ref 11.5–15.5)
RDW: 18.6 % — ABNORMAL HIGH (ref 11.5–15.5)
WBC: 14.5 10*3/uL — ABNORMAL HIGH (ref 4.0–10.5)
WBC: 13.9 10*3/uL — ABNORMAL HIGH (ref 4.0–10.5)
nRBC: 0.1 % (ref 0.0–0.2)
nRBC: 0 % (ref 0.0–0.2)

## 2020-11-06 LAB — GLUCOSE, CAPILLARY
Glucose-Capillary: 107 mg/dL — ABNORMAL HIGH (ref 70–99)
Glucose-Capillary: 119 mg/dL — ABNORMAL HIGH (ref 70–99)
Glucose-Capillary: 132 mg/dL — ABNORMAL HIGH (ref 70–99)
Glucose-Capillary: 132 mg/dL — ABNORMAL HIGH (ref 70–99)

## 2020-11-06 LAB — APTT: aPTT: 37 seconds — ABNORMAL HIGH (ref 24–36)

## 2020-11-06 LAB — PROTIME-INR
INR: 1.2 (ref 0.8–1.2)
Prothrombin Time: 15.3 seconds — ABNORMAL HIGH (ref 11.4–15.2)

## 2020-11-06 MED ORDER — ONDANSETRON HCL 4 MG/2ML IJ SOLN
4.0000 mg | Freq: Four times a day (QID) | INTRAMUSCULAR | Status: DC | PRN
  Administered 2020-11-06 – 2020-11-07 (×2): 4 mg via INTRAVENOUS
  Filled 2020-11-06 (×2): qty 2

## 2020-11-06 MED ORDER — SODIUM CHLORIDE 0.9 % IV SOLN
6.2500 mg | Freq: Four times a day (QID) | INTRAVENOUS | Status: DC | PRN
  Filled 2020-11-06 (×4): qty 0.25

## 2020-11-06 NOTE — Progress Notes (Signed)
Palliative care brief progress note  Palliative care consult received.  I attempted to see Ms. Harte today.  She is off the floor for procedure.  Palliative care follow-up later today versus tomorrow for consultation.  Micheline Rough, MD [facility] [phone]  No charge note

## 2020-11-06 NOTE — Progress Notes (Signed)
Resumed care of patient from [employer], Therapist, sports. Agree with her previous assessment. Patient currently resting in bed with 2 daughters at bedside. Addressed needs at this time. Will continue to monitor patient.

## 2020-11-06 NOTE — Progress Notes (Addendum)
PROGRESS NOTE    Tonya [name]  WVP:[id number] DOB: 1940-06-25 DOA: 11/04/2020 PCP: Leeroy Cha, MD     Brief Narrative:  Patient is an 80 year old female with past medical history of stage IV adenocarcinoma of the colon, history of DVT currently on Eliquis, hypertension, diabetes who was brought to the ER after rectal bleeding.  She has anemia secondary to chemotherapy at baseline and has received blood transfusions and iron infusions in the past.  In the ER she was hypotensive that improved with fluids.  Hemoglobin 7.6.   New events last 24 hours / Subjective: 2 of her daughters are bedside.  She reports she is tired because people keep waking her up.  She has no more pain or nausea.  She has received 4 total units of packed red blood cells.  Hemoglobin today is 8.3 down from 9.8 yesterday.  Her appetite is poor but she is not having any nausea or vomiting.  Assessment & Plan:   Principal Problem:   Acute GI bleeding  Appreciate GI  Protonix 40 mg daily  CBC q 6 hrs  S/p 2 u pRBCs on 7/14  Hb 8.3 today, down from 9.8 after transfusion  IVF's NS 75 mL/hr  Paged heme for input x2 today, no response, but likely not needed in setting of stable Hb.   Active Problems:   Essential hypertension  Hold home meds    CVA, old, hemiparesis ([place])  Hold antiplatelets    Iron deficiency anemia due to chronic blood loss    Colon cancer metastasized to liver [facility])  Follows with Dr. Marin Olp who will see the patient on Monday    Diabetes ([place])  SSI    Pressure injury of skin  Appreciate wound care team  In agreement with assessment of the pressure ulcer as below:  Pressure Injury 11/04/20 Sacrum Stage 2 -  Partial thickness loss of dermis presenting as a shallow open injury with a red, pink wound bed without slough. open wound, red, yellow slough (Active)  11/04/20 1615  Location: Sacrum  Location Orientation:   Staging: Stage 2 -  Partial thickness loss of dermis  presenting as a shallow open injury with a red, pink wound bed without slough.  Wound Description (Comments): open wound, red, yellow slough  Present on Admission: Yes    DVT prophylaxis: SCDs Code Status: DNR Family Communication: Self, daughter is at bedside Coming From: Home Disposition Plan: Pending Barriers to Discharge: Medical improvement  Consultants:  Gastroenterology  Procedures:  EGD- 11/05/20  Objective: Vitals:   11/05/20 2227 11/06/20 0006 11/06/20 0714 11/06/20 1252  BP: 94/69 (!) 140/122 117/79 113/69  Pulse: (!) 109 (!) 110 94 98  Resp: 18  18 18   Temp: 97.6 F (36.4 C) 98.2 F (36.8 C) 98 F (36.7 C) 98.2 F (36.8 C)  TempSrc:  Oral  Oral  SpO2: 97% 99% 97% 98%  Weight:      Height:        Intake/Output Summary (Last 24 hours) at 11/06/2020 1347 Last data filed at 11/06/2020 1000 Gross per 24 hour  Intake 849.55 ml  Output 50 ml  Net 799.55 ml   Filed Weights   11/04/20 1220 11/05/20 1421  Weight: 73 kg 73 kg    Examination:  General exam: Appears calm and comfortable  Respiratory system: Clear to auscultation. Respiratory effort normal. No respiratory distress. No conversational dyspnea.  Cardiovascular system: S1 & S2 heard, RRR. No murmurs. No pedal edema. Gastrointestinal system: Abdomen is mildly  distended, soft and nontender. Normal bowel sounds heard. Central nervous system: Alert and oriented. No focal neurological deficits. Speech clear.  Extremities: Symmetric in appearance  Skin: No rashes, lesions or ulcers on exposed skin  Psychiatry: Judgement and insight appear normal. Mood & affect appropriate.   Data Reviewed: I have personally reviewed following labs and imaging studies  CBC: Recent Labs  Lab 11/04/20 1252 11/05/20 0452 11/06/20 0320  WBC 13.5* 17.5* 13.9*  NEUTROABS 11.1*  --  11.2*  HGB 7.6* 9.8* 8.3*  HCT 24.8* 29.0* 25.9*  MCV 95.4 90.6 93.8  PLT 247 223 865   Basic Metabolic Panel: Recent Labs  Lab  11/04/20 1252 11/05/20 0452 11/06/20 0320  NA 140 139 139  K 4.0 4.1 3.8  CL 102 101 103  CO2 28 28 29   GLUCOSE 149* 122* 130*  BUN 30* 32* 36*  CREATININE 1.49* 1.61* 1.92*  CALCIUM 8.7* 8.4* 8.2*   GFR: Estimated Creatinine Clearance: 25.2 mL/min (A) (by C-G formula based on SCr of 1.92 mg/dL (H)). Liver Function Tests: Recent Labs  Lab 11/04/20 1252 11/05/20 1627  AST 40 39  ALT 16 16  ALKPHOS 261* 272*  BILITOT 1.0 2.0*  PROT 4.8* 5.0*  ALBUMIN 2.1* 2.2*   Coagulation Profile: Recent Labs  Lab 11/04/20 1252 11/06/20 0320  INR 2.3* 1.2   HbA1C: Recent Labs    11/05/20 1111  HGBA1C 4.5*   CBG: Recent Labs  Lab 11/05/20 1207 11/05/20 1637 11/05/20 2207 11/06/20 0758 11/06/20 1113  GLUCAP 130* 132* 136* 107* 119*   Anemia Panel: Recent Labs    11/05/20 1111  FERRITIN 1,381*  TIBC 156*  IRON 38   Recent Results (from the past 240 hour(s))  SARS CORONAVIRUS 2 (TAT 6-24 HRS) Nasopharyngeal Nasopharyngeal Swab     Status: None   Collection Time: 11/04/20  2:21 PM   Specimen: Nasopharyngeal Swab  Result Value Ref Range Status   SARS Coronavirus 2 NEGATIVE NEGATIVE Final    Comment: (NOTE) SARS-CoV-2 target nucleic acids are NOT DETECTED.  The SARS-CoV-2 RNA is generally detectable in upper and lower respiratory specimens during the acute phase of infection. Negative results do not preclude SARS-CoV-2 infection, do not rule out co-infections with other pathogens, and should not be used as the sole basis for treatment or other patient management decisions. Negative results must be combined with clinical observations, patient history, and epidemiological information. The expected result is Negative.  Fact Sheet for Patients: SugarRoll.be  Fact Sheet for Healthcare Providers: https://www.woods-mathews.com/  This test is not yet approved or cleared by the [place] FDA and  has been authorized for  detection and/or diagnosis of SARS-CoV-2 by FDA under an Emergency Use Authorization (EUA). This EUA will remain  in effect (meaning this test can be used) for the duration of the COVID-19 declaration under Se ction 564(b)(1) of the Act, 21 U.S.C. section 360bbb-3(b)(1), unless the authorization is terminated or revoked sooner.  Performed at [facility], [place] [address], [place], [place] [address]       Radiology Studies: DG Chest Port 1 View  Result Date: 11/04/2020 CLINICAL DATA:  The patient reports right red blood per rectum this morning EXAM: PORTABLE CHEST 1 VIEW COMPARISON:  PA and lateral chest 12/02/2013. CT chest, abdomen and pelvis 07/08/2020. FINDINGS: Right Port-A-Cath is in place. The patient has moderate right and small left pleural effusions with associated basilar airspace disease. Aortic atherosclerosis is noted. Heart size is normal. No acute or focal bony  abnormality. IMPRESSION: Moderate right and small left pleural effusions and basilar airspace disease are new since the most recent exam. Aortic Atherosclerosis (ICD10-I70.0). Electronically Signed   By: Inge Rise M.D.   On: 11/04/2020 14:22     Scheduled Meds:  Chlorhexidine Gluconate Cloth  6 each Topical Daily   insulin aspart  0-15 Units Subcutaneous TID WC   insulin aspart  0-5 Units Subcutaneous QHS   NONFORMULARY OR COMPOUNDED ITEM 1 application  1 application Topical QHS   pantoprazole  40 mg Oral QAC breakfast   sodium chloride flush  10-40 mL Intracatheter Q12H   Continuous Infusions:  sodium chloride 75 mL/hr at 11/06/20 1153   promethazine (PHENERGAN) injection (IM or IVPB)       LOS: 0 days    Time spent: 36 minutes   Shelda Pal, DO [facility] 11/06/2020, 1:47 PM   Available via Epic secure chat 7am-7pm After these hours, please refer to coverage provider listed on [email or web address]

## 2020-11-06 NOTE — Progress Notes (Signed)
The patient complained of nausea vomiting this am. Provider paged to assist.

## 2020-11-07 LAB — CBC
HCT: 27.1 % — ABNORMAL LOW (ref 36.0–46.0)
HCT: 27.6 % — ABNORMAL LOW (ref 36.0–46.0)
HCT: 28 % — ABNORMAL LOW (ref 36.0–46.0)
HCT: 27.1 % — ABNORMAL LOW (ref 36.0–46.0)
Hemoglobin: 8.5 g/dL — ABNORMAL LOW (ref 12.0–15.0)
Hemoglobin: 8.8 g/dL — ABNORMAL LOW (ref 12.0–15.0)
Hemoglobin: 8.9 g/dL — ABNORMAL LOW (ref 12.0–15.0)
Hemoglobin: 8.5 g/dL — ABNORMAL LOW (ref 12.0–15.0)
MCH: 29.8 pg (ref 26.0–34.0)
MCH: 30.2 pg (ref 26.0–34.0)
MCH: 30.3 pg (ref 26.0–34.0)
MCH: 30.4 pg (ref 26.0–34.0)
MCHC: 31.4 g/dL (ref 30.0–36.0)
MCHC: 31.9 g/dL (ref 30.0–36.0)
MCHC: 31.8 g/dL (ref 30.0–36.0)
MCHC: 31.4 g/dL (ref 30.0–36.0)
MCV: 95.1 fL (ref 80.0–100.0)
MCV: 94.8 fL (ref 80.0–100.0)
MCV: 95.2 fL (ref 80.0–100.0)
MCV: 96.8 fL (ref 80.0–100.0)
Platelets: 185 10*3/uL (ref 150–400)
Platelets: 184 10*3/uL (ref 150–400)
Platelets: 186 10*3/uL (ref 150–400)
Platelets: 186 10*3/uL (ref 150–400)
RBC: 2.85 MIL/uL — ABNORMAL LOW (ref 3.87–5.11)
RBC: 2.91 MIL/uL — ABNORMAL LOW (ref 3.87–5.11)
RBC: 2.94 MIL/uL — ABNORMAL LOW (ref 3.87–5.11)
RBC: 2.8 MIL/uL — ABNORMAL LOW (ref 3.87–5.11)
RDW: 18.6 % — ABNORMAL HIGH (ref 11.5–15.5)
RDW: 18.7 % — ABNORMAL HIGH (ref 11.5–15.5)
RDW: 18.8 % — ABNORMAL HIGH (ref 11.5–15.5)
RDW: 18.5 % — ABNORMAL HIGH (ref 11.5–15.5)
WBC: 13.5 10*3/uL — ABNORMAL HIGH (ref 4.0–10.5)
WBC: 13.6 10*3/uL — ABNORMAL HIGH (ref 4.0–10.5)
WBC: 13.7 10*3/uL — ABNORMAL HIGH (ref 4.0–10.5)
WBC: 13.8 10*3/uL — ABNORMAL HIGH (ref 4.0–10.5)
nRBC: 0 % (ref 0.0–0.2)
nRBC: 0 % (ref 0.0–0.2)
nRBC: 0 % (ref 0.0–0.2)
nRBC: 0 % (ref 0.0–0.2)

## 2020-11-07 LAB — BASIC METABOLIC PANEL
Anion gap: 8 (ref 5–15)
BUN: 36 mg/dL — ABNORMAL HIGH (ref 8–23)
CO2: 29 mmol/L (ref 22–32)
Calcium: 8.3 mg/dL — ABNORMAL LOW (ref 8.9–10.3)
Chloride: 103 mmol/L (ref 98–111)
Creatinine, Ser: 1.79 mg/dL — ABNORMAL HIGH (ref 0.44–1.00)
GFR, Estimated: 28 mL/min — ABNORMAL LOW (ref >60)
Glucose, Bld: 126 mg/dL — ABNORMAL HIGH (ref 70–99)
Potassium: 3.2 mmol/L — ABNORMAL LOW (ref 3.5–5.1)
Sodium: 140 mmol/L (ref 135–145)

## 2020-11-07 LAB — GLUCOSE, CAPILLARY
Glucose-Capillary: 115 mg/dL — ABNORMAL HIGH (ref 70–99)
Glucose-Capillary: 157 mg/dL — ABNORMAL HIGH (ref 70–99)
Glucose-Capillary: 145 mg/dL — ABNORMAL HIGH (ref 70–99)
Glucose-Capillary: 177 mg/dL — ABNORMAL HIGH (ref 70–99)
Glucose-Capillary: 151 mg/dL — ABNORMAL HIGH (ref 70–99)

## 2020-11-07 MED ORDER — POTASSIUM CHLORIDE CRYS ER 20 MEQ PO TBCR
30.0000 meq | EXTENDED_RELEASE_TABLET | Freq: Two times a day (BID) | ORAL | Status: AC
  Administered 2020-11-07 (×2): 30 meq via ORAL
  Filled 2020-11-07 (×2): qty 1

## 2020-11-07 MED ORDER — PANTOPRAZOLE SODIUM 40 MG PO TBEC
40.0000 mg | DELAYED_RELEASE_TABLET | Freq: Two times a day (BID) | ORAL | Status: DC
  Administered 2020-11-07 – 2020-11-08 (×2): 40 mg via ORAL
  Filled 2020-11-07 (×2): qty 1

## 2020-11-07 NOTE — Progress Notes (Addendum)
PROGRESS NOTE    Tonya [name]  PXT:[id number] DOB: 16-Mar-1941 DOA: 11/04/2020 PCP: Leeroy Cha, MD     Brief Narrative:  Patient is an 80 year old female with past medical history of stage IV adenocarcinoma of the colon following with Dr. Arelia Sneddon, history of DVT late in 2021 and another early in 2022 on Eliquis and with an IVC filter, hypertension, diabetes who was brought to the ER after rectal bleeding.  She has anemia secondary to chemotherapy at baseline and has received blood transfusions and iron infusions in the past.  In the ER she was hypotensive that improved with fluids.  Hemoglobin 7.6.  She did require 2 units of packed red blood cells.  EGD on 7/15 showed some ulcerations.   New events last 24 hours / Subjective: One of her daughters is bedside.  Patient is still tired.  Eating and drinking okay.  No pain.  Hemoglobin has been stable.  She was having some swelling yesterday afternoon.  She started elevating her legs and the fluids were stopped.  Blood pressures remained stable.  Her daughter is concerned she has not been receiving Protonix.  Assessment & Plan:   Principal Problem:   Acute GI bleeding             Appreciate GI's input, they have signed off             Protonix 40 mg twice daily; will make sure she is receiving this             CBC q 12 hrs given stability             S/p 2 u pRBCs on 7/14             Hb stable             Fluids held given edema and BP stability             Dr. Marin Olp will see her tomorrow, as she has ulcerations, IVC filter and DVT was >2-3 mo ago, will hold off on adding Lovenox or other anticoag until she is seen by Heme/onc   Active Problems:   Essential hypertension             Hold home meds     CVA, old, hemiparesis ([place])             Hold antiplatelets     Iron deficiency anemia due to chronic blood loss     Colon cancer metastasized to liver [facility])             Follows with Dr. Marin Olp who will see the patient on  Monday     Diabetes ([place])             SSI     Pressure injury of skin             Appreciate wound care team  In agreement with assessment of the pressure ulcer as below:  Pressure Injury 11/04/20 Sacrum Stage 2 -  Partial thickness loss of dermis presenting as a shallow open injury with a red, pink wound bed without slough. open wound, red, yellow slough (Active)  11/04/20 1615  Location: Sacrum  Location Orientation:   Staging: Stage 2 -  Partial thickness loss of dermis presenting as a shallow open injury with a red, pink wound bed without slough.  Wound Description (Comments): open wound, red, yellow slough  Present on Admission: Yes   DVT prophylaxis: SCDs Code Status: DNR  Family Communication: Self, daughter is at bedside Coming From: Home Disposition Plan: Pending Barriers to Discharge: Medical improvement   Consultants:  Gastroenterology   Procedures:  EGD- 11/05/20  Objective: Vitals:   11/06/20 0714 11/06/20 1252 11/06/20 2057 11/07/20 0433  BP: 117/79 113/69 133/86 121/85  Pulse: 94 98 94 (!) 101  Resp: 18 18 20 20   Temp: 98 F (36.7 C) 98.2 F (36.8 C) 98.5 F (36.9 C) 97.7 F (36.5 C)  TempSrc:  Oral  Oral  SpO2: 97% 98% 99% 97%  Weight:      Height:        Intake/Output Summary (Last 24 hours) at 11/07/2020 1138 Last data filed at 11/07/2020 0515 Gross per 24 hour  Intake 200 ml  Output 272 ml  Net -72 ml   Filed Weights   11/04/20 1220 11/05/20 1421  Weight: 73 kg 73 kg    Examination:  General exam: Appears calm and comfortable  Respiratory system: Clear to auscultation. Respiratory effort normal. No respiratory distress. No conversational dyspnea.  Cardiovascular system: S1 & S2 heard, regular rate, irregularly irregular rhythm, loud systolic ejection murmur heard.  3+ pitting edema tapering at the distal third of the thigh bilaterally Gastrointestinal system: Abdomen is moderately distended distended, soft and with mild tenderness to  palpation diffusely. Normal bowel sounds heard. Central nervous system: Alert and oriented. No focal neurological deficits. Speech clear.  Extremities: Symmetric in appearance, no tenderness over the calf region bilaterally Skin: No rashes, lesions or ulcers on exposed skin  Psychiatry: Judgement and insight appear normal. Mood & affect appropriate.   Data Reviewed: I have personally reviewed following labs and imaging studies  CBC: Recent Labs  Lab 11/04/20 1252 11/05/20 0452 11/06/20 0320 11/06/20 1450 11/06/20 2023 11/07/20 0238 11/07/20 0840  WBC 13.5*   < > 13.9* 14.5* 13.9* 13.5* 13.6*  NEUTROABS 11.1*  --  11.2*  --   --   --   --   HGB 7.6*   < > 8.3* 8.6* 8.2* 8.5* 8.8*  HCT 24.8*   < > 25.9* 26.3* 25.8* 27.1* 27.6*  MCV 95.4   < > 93.8 93.3 94.9 95.1 94.8  PLT 247   < > 195 207 182 185 184   < > = values in this interval not displayed.   Basic Metabolic Panel: Recent Labs  Lab 11/04/20 1252 11/05/20 0452 11/06/20 0320 11/07/20 0238  NA 140 139 139 140  K 4.0 4.1 3.8 3.2*  CL 102 101 103 103  CO2 28 28 29 29   GLUCOSE 149* 122* 130* 126*  BUN 30* 32* 36* 36*  CREATININE 1.49* 1.61* 1.92* 1.79*  CALCIUM 8.7* 8.4* 8.2* 8.3*   GFR: Estimated Creatinine Clearance: 27.1 mL/min (A) (by C-G formula based on SCr of 1.79 mg/dL (H)). Liver Function Tests: Recent Labs  Lab 11/04/20 1252 11/05/20 1627  AST 40 39  ALT 16 16  ALKPHOS 261* 272*  BILITOT 1.0 2.0*  PROT 4.8* 5.0*  ALBUMIN 2.1* 2.2*   Coagulation Profile: Recent Labs  Lab 11/04/20 1252 11/06/20 0320  INR 2.3* 1.2   HbA1C: Recent Labs    11/05/20 1111  HGBA1C 4.5*   CBG: Recent Labs  Lab 11/06/20 0758 11/06/20 1113 11/06/20 1643 11/06/20 2053 11/07/20 0806  GLUCAP 107* 119* 132* 132* 115*   Anemia Panel: Recent Labs    11/05/20 1111  FERRITIN 1,381*  TIBC 156*  IRON 38   Recent Results (from the past 240 hour(s))  SARS CORONAVIRUS 2 (  TAT 6-24 HRS) Nasopharyngeal  Nasopharyngeal Swab     Status: None   Collection Time: 11/04/20  2:21 PM   Specimen: Nasopharyngeal Swab  Result Value Ref Range Status   SARS Coronavirus 2 NEGATIVE NEGATIVE Final    Comment: (NOTE) SARS-CoV-2 target nucleic acids are NOT DETECTED.  The SARS-CoV-2 RNA is generally detectable in upper and lower respiratory specimens during the acute phase of infection. Negative results do not preclude SARS-CoV-2 infection, do not rule out co-infections with other pathogens, and should not be used as the sole basis for treatment or other patient management decisions. Negative results must be combined with clinical observations, patient history, and epidemiological information. The expected result is Negative.  Fact Sheet for Patients: SugarRoll.be  Fact Sheet for Healthcare Providers: https://www.woods-mathews.com/  This test is not yet approved or cleared by the [place] FDA and  has been authorized for detection and/or diagnosis of SARS-CoV-2 by FDA under an Emergency Use Authorization (EUA). This EUA will remain  in effect (meaning this test can be used) for the duration of the COVID-19 declaration under Se ction 564(b)(1) of the Act, 21 U.S.C. section 360bbb-3(b)(1), unless the authorization is terminated or revoked sooner.  Performed at [facility], [place] [address], [place], [place] [address]      Scheduled Meds:  Chlorhexidine Gluconate Cloth  6 each Topical Daily   insulin aspart  0-15 Units Subcutaneous TID WC   insulin aspart  0-5 Units Subcutaneous QHS   NONFORMULARY OR COMPOUNDED ITEM 1 application  1 application Topical QHS   pantoprazole  40 mg Oral QAC breakfast   potassium chloride  30 mEq Oral BID   sodium chloride flush  10-40 mL Intracatheter Q12H   Continuous Infusions:  promethazine (PHENERGAN) injection (IM or IVPB)       LOS: 1 day    Time spent: 30 minutes   Shelda Pal,  DO [facility] 11/07/2020, 11:38 AM   Available via Epic secure chat 7am-7pm After these hours, please refer to coverage provider listed on [email or web address]

## 2020-11-07 NOTE — Consult Note (Signed)
Consultation Note Date: 11/07/2020   Patient Name: Tonya [name]  DOB: 05-31-1940  MRN: [id number]  Age / Sex: 80 y.o., female  PCP: Leeroy Cha, MD Referring Physician: Shelda Pal,*  Reason for Consultation: Establishing goals of care  HPI/Patient Profile: 80 y.o. female  with past medical history of stage IV adenocarcinoma of the colon, DVT on Eliquis, hypertension, diabetes admitted on 11/04/2020 with rectal bleeding.  She receives transfusions and iron infusions secondary to anemia.  She is evaluated by GI and underwent EGD which showed some ulcerations.  She had previously been referred for hospice services but family decided not to pursue hospice care once they discuss further with hospice and it was determined that she would not receive further transfusions.  Palliative consulted for goals of care.  Clinical Assessment and Goals of Care: I met today with [name] and her family. We discussed clinical course as well as wishes moving forward in regard to advanced directives.  Concepts specific to code status and rehospitalization discussed.  We discussed difference between a aggressive medical intervention path and a palliative, comfort focused care path.  Values and goals of care important to patient and family were attempted to be elicited.   Concept of Hospice and Palliative Care were discussed.  Family is familiar with both of these services and plan had been for her to enroll in hospice services until family found out she would not be receiving further transfusions.  They feel that continued transfusion support is adding to her quality of life or not in a point where they are willing to consider foregoing further transfusions if they are going to continue to be offered.   Questions and concerns addressed.   PMT will continue to support holistically.   SUMMARY OF RECOMMENDATIONS    -DNR/DNI -Family familiar with scope of hospice services and [name] is not interested in hospice at this time as she wants to continue to receive transfusions.   -Question if bleeding is related to ulcerations noted on EGD versus potential it is related to cancer.  Continue current interventions.  Family is hopeful for stabilization and improvement in her function.  Family would appreciate continued input from GI service and is also awaiting opinion of Dr. Marin Olp (when he returns next week). -No other palliative specific recommendations at this point.  Family is followed by outpatient palliative care and understands about the scope of hospice and how to access hospice services when the time comes that it would like to elect hospice care.  Would recommend she continue to be followed by outpatient palliative care. -Please call if there are other palliative specific needs with which we can be of assistance in the care of [name] moving forward.  Code Status/Advance Care Planning: DNR   Additional Recommendations (Limitations, Scope, Preferences): Full Scope Treatment  Psycho-social/Spiritual:  Desire for further Chaplaincy support: Did not address today Additional Recommendations: Caregiving  Support/Resources  Prognosis:  Guarded     Primary Diagnoses: Present on Admission:  Acute GI bleeding  Essential hypertension  Iron deficiency anemia due to chronic blood loss  Colon cancer metastasized to liver [facility])   I have reviewed the medical record, interviewed the patient and family, and examined the patient. The following aspects are pertinent.  Past Medical History:  Diagnosis Date   Cerebrovascular accident [facility]) 2001   left side weakness   Chronic edema    ? venous insufficiency   Colon cancer ([place]) 2020   COPD (chronic obstructive pulmonary disease) ([place])    never had a problem breathing   Diabetes mellitus without complication ([place])    Dyspnea    Enlarged heart     per pt   Gallstones    GERD (gastroesophageal reflux disease)    Goals of care, counseling/discussion 11/01/2018   History of radiation therapy 04/08/2020-05/21/2020   Colon;Dr. Gery Pray   Hyperlipidemia    Hypertension    PONV (postoperative nausea and vomiting)    nausea    Social History   Socioeconomic History   Marital status: Widowed    Spouse name: Not on file   Number of children: 5   Years of education: 9   Highest education level: 9th grade  Occupational History   Occupation: Retired  Tobacco Use   Smoking status: Former    Packs/day: 0.25    Types: Cigarettes    Quit date: 07/23/1968    Years since quitting: 52.3   Smokeless tobacco: Never   Tobacco comments:    6 MONTH  Vaping Use   Vaping Use: Never used  Substance and Sexual Activity   Alcohol use: No    Alcohol/week: 0.0 standard drinks   Drug use: No   Sexual activity: Not Currently  Other Topics Concern   Not on file  Social History Narrative   Not on file   Social Determinants of Health   Financial Resource Strain: Low Risk    Difficulty of Paying Living Expenses: Not hard at all  Food Insecurity: No Food Insecurity   Worried About Charity fundraiser in the Last Year: Never true   [place] in the Last Year: Never true  Transportation Needs: No Transportation Needs   Lack of Transportation (Medical): No   Lack of Transportation (Non-Medical): No  Physical Activity: Inactive   Days of Exercise per Week: 0 days   Minutes of Exercise per Session: 0 min  Stress: No Stress Concern Present   Feeling of Stress : Not at all  Social Connections: Moderately Isolated   Frequency of Communication with Friends and Family: More than three times a week   Frequency of Social Gatherings with Friends and Family: More than three times a week   Attends Religious Services: More than 4 times per year   Active Member of [employer] or Organizations: No   Attends Archivist Meetings: Never    Marital Status: Widowed   Family History  Problem Relation Age of Onset   Cirrhosis Mother    Heart disease Father    Cancer Other    Hypertension Other    Diabetes Other    Scheduled Meds:  Chlorhexidine Gluconate Cloth  6 each Topical Daily   insulin aspart  0-15 Units Subcutaneous TID WC   insulin aspart  0-5 Units Subcutaneous QHS   NONFORMULARY OR COMPOUNDED ITEM 1 application  1 application Topical QHS   pantoprazole  40 mg Oral QAC breakfast   potassium chloride  30 mEq Oral BID   sodium chloride flush  10-40 mL  Intracatheter Q12H   Continuous Infusions:  sodium chloride 75 mL/hr at 11/06/20 1153   promethazine (PHENERGAN) injection (IM or IVPB)     PRN Meds:.acetaminophen, ondansetron (ZOFRAN) IV, promethazine (PHENERGAN) injection (IM or IVPB) Medications Prior to Admission:  Prior to Admission medications   Medication Sig Start Date End Date Taking? Authorizing Provider  acetaminophen (TYLENOL) 500 MG tablet Take 500 mg by mouth every 6 (six) hours as needed for moderate pain.   Yes [provider]  famotidine (PEPCID) 40 MG tablet TAKE 1 TABLET TWICE DAILY Patient taking differently: Take 40 mg by mouth 2 (two) times daily. 08/24/20  Yes Ennever, Rudell Cobb, MD  insulin degludec (TRESIBA FLEXTOUCH) 100 UNIT/ML FlexTouch Pen Inject 0.21 mLs (21 Units total) into the skin daily. Take 34 units on chemo days Patient taking differently: Inject 0-8 Units into the skin See admin instructions. Sliding scale 09/19/19  Yes Renato Shin, MD  ketoconazole (NIZORAL) 2 % cream Apply 1 application topically 2 (two) times daily. Patient taking differently: Apply 1 application topically daily as needed for irritation. 02/09/20  Yes McDonald, Stephan Minister, DPM  lidocaine-prilocaine (EMLA) cream Apply 1 application topically as needed for up to 30 doses. Patient taking differently: Apply 1 application topically daily as needed (port access). 09/01/20  Yes Ennever, Rudell Cobb, MD  meclizine  (ANTIVERT) 12.5 MG tablet TAKE 1 TABLET BY MOUTH THREE TIMES DAILY AS NEEDED FOR DIZZINESS Patient taking differently: Take 12.5 mg by mouth 3 (three) times daily as needed for dizziness or nausea. 09/27/20  Yes [name], MD  metoprolol succinate (TOPROL-XL) 25 MG 24 hr tablet 25 mg daily. 01/02/20  Yes [provider]  naproxen sodium (ALEVE) 220 MG tablet Take 440 mg by mouth daily as needed (pain).   Yes [provider]  ondansetron (ZOFRAN) 8 MG tablet TAKE 1 TABLET(8 MG) BY MOUTH EVERY 8 HOURS AS NEEDED FOR NAUSEA OR VOMITING Patient taking differently: Take 8 mg by mouth every 8 (eight) hours as needed. 10/15/20  Yes Ennever, Rudell Cobb, MD  prochlorperazine (COMPAZINE) 10 MG tablet TAKE 1 TABLET(10 MG) BY MOUTH EVERY 6 HOURS AS NEEDED FOR NAUSEA OR VOMITING Patient taking differently: Take 10 mg by mouth every 8 (eight) hours as needed for nausea or vomiting. 08/23/20  Yes [name], MD  torsemide (DEMADEX) 20 MG tablet Take 1 tablet (20 mg total) by mouth daily. With addition doses as needed. Patient taking differently: Take 20 mg by mouth daily. 09/09/20 01/07/21 Yes Cantwell, Celeste C, PA-C  Alcohol Swabs (B-D SINGLE USE SWABS REGULAR) PADS 1 each by Does not apply route as needed (to cleanse site prior to obtaining droplet of blood for CBG's). E11.9 07/08/19   Renato Shin, MD  B-D ULTRAFINE III SHORT PEN 31G X 8 MM MISC  09/01/19   [provider]  ELIQUIS 5 MG TABS tablet TAKE 1 TABLET(5 MG) BY MOUTH TWICE DAILY Patient taking differently: Take 5 mg by mouth 2 (two) times daily. 08/18/20   [name], MD  magic mouthwash SOLN Swish and swallow equal parts Benadryl, Lidocaine and Nystatin.  5- 10 ml.'s four times a day as needed. Patient not taking: No sig reported 05/27/19   [name], MD  traMADol (ULTRAM) 50 MG tablet TAKE 1 TABLET(50 MG) BY MOUTH EVERY 6 HOURS AS NEEDED Patient not taking: No sig reported 08/31/20   [name], MD  TRUE  METRIX BLOOD GLUCOSE TEST test strip TEST BLOOD SUGAR TWICE DAILY  01/16/20   Renato Shin, MD  TRUEplus Lancets 30G MISC 1 each by Does not apply route 2 (two) times daily. E11.9 07/08/19   Renato Shin, MD   Allergies  Allergen Reactions   Dilaudid [Hydromorphone]     unconscious   Hydrocodone Swelling and Other (See Comments)    "I started swelling, became red, and passed out"   Iohexol Hives, Shortness Of Breath and Other (See Comments)    Patient developed hives and fullness in throat post injection of 125cc's Omni 300, Onset Date: 11/15/2006    Morphine And Related Other (See Comments)    Unconscious, can't talk for 2 days   Naproxen Other (See Comments)    "It made me feel out of my head"   Other Other (See Comments)   Ibuprofen Nausea Only   Propoxyphene N-Acetaminophen Other (See Comments)    "I couldn't find the door to make my way out of the room- I was in misery"   Review of Systems  Physical Exam General: Alert, awake, in no acute distress.   HEENT: No bruits, no goiter, no JVD Lungs: Good air movement Ext: ++ edema Skin: Warm and dry Neuro: Grossly intact, nonfocal.   Vital Signs: BP 121/85 (BP Location: Right Arm)   Pulse (!) 101   Temp 97.7 F (36.5 C) (Oral)   Resp 20   Ht _0  (1.803 m)   Wt 73 kg   SpO2 97%   BMI 22.45 kg/m  Pain Scale: 0-10 POSS *See Group Information*: S-Acceptable,Sleep, easy to arouse Pain Score: 5    SpO2: SpO2: 97 % O2 Device:SpO2: 97 % O2 Flow Rate: .O2 Flow Rate (L/min): 2 L/min  IO: Intake/output summary:  Intake/Output Summary (Last 24 hours) at 11/07/2020 0843 Last data filed at 11/07/2020 0515 Gross per 24 hour  Intake 320 ml  Output 322 ml  Net -2 ml    LBM: Last BM Date: 11/06/20 Baseline Weight: Weight: 73 kg Most recent weight: Weight: 73 kg     Palliative Assessment/Data:   Flowsheet Rows    Flowsheet Row Most Recent Value  Intake Tab   Referral Department Hospitalist  Unit at Time of Referral  Med/Surg Unit  Palliative Care Primary Diagnosis Cancer  Date Notified 11/04/20  Palliative Care Type New Palliative care  Reason for referral Clarify Goals of Care  Date of Admission 11/04/20  Date first seen by Palliative Care 11/06/20  # of days Palliative referral response time 2 Day(s)  # of days IP prior to Palliative referral 0  Clinical Assessment   Palliative Performance Scale Score 40%  Psychosocial & Spiritual Assessment   Palliative Care Outcomes   Patient/Family meeting held? Yes  Who was at the meeting? Patient, daughters       Time In: 59 Time Out: 1555 Time Total: 52 Greater than 50%  of this time was spent counseling and coordinating care related to the above assessment and plan.  Signed by: Micheline Rough, MD   Please contact Palliative Medicine Team phone at [phone] for questions and concerns.  For individual provider: See Shea Evans

## 2020-11-08 DIAGNOSIS — K279 Peptic ulcer, site unspecified, unspecified as acute or chronic, without hemorrhage or perforation: Secondary | ICD-10-CM

## 2020-11-08 DIAGNOSIS — C189 Malignant neoplasm of colon, unspecified: Secondary | ICD-10-CM | POA: Diagnosis not present

## 2020-11-08 DIAGNOSIS — C182 Malignant neoplasm of ascending colon: Secondary | ICD-10-CM | POA: Diagnosis not present

## 2020-11-08 DIAGNOSIS — K922 Gastrointestinal hemorrhage, unspecified: Secondary | ICD-10-CM | POA: Diagnosis not present

## 2020-11-08 LAB — BASIC METABOLIC PANEL
Anion gap: 11 (ref 5–15)
BUN: 33 mg/dL — ABNORMAL HIGH (ref 8–23)
CO2: 28 mmol/L (ref 22–32)
Calcium: 8.4 mg/dL — ABNORMAL LOW (ref 8.9–10.3)
Chloride: 103 mmol/L (ref 98–111)
Creatinine, Ser: 1.54 mg/dL — ABNORMAL HIGH (ref 0.44–1.00)
GFR, Estimated: 34 mL/min — ABNORMAL LOW (ref >60)
Glucose, Bld: 143 mg/dL — ABNORMAL HIGH (ref 70–99)
Potassium: 3.8 mmol/L (ref 3.5–5.1)
Sodium: 142 mmol/L (ref 135–145)

## 2020-11-08 LAB — CBC
HCT: 26.7 % — ABNORMAL LOW (ref 36.0–46.0)
Hemoglobin: 8.4 g/dL — ABNORMAL LOW (ref 12.0–15.0)
MCH: 30.5 pg (ref 26.0–34.0)
MCHC: 31.5 g/dL (ref 30.0–36.0)
MCV: 97.1 fL (ref 80.0–100.0)
Platelets: 172 10*3/uL (ref 150–400)
RBC: 2.75 MIL/uL — ABNORMAL LOW (ref 3.87–5.11)
RDW: 19 % — ABNORMAL HIGH (ref 11.5–15.5)
WBC: 14 10*3/uL — ABNORMAL HIGH (ref 4.0–10.5)
nRBC: 0 % (ref 0.0–0.2)

## 2020-11-08 LAB — SURGICAL PATHOLOGY

## 2020-11-08 LAB — GLUCOSE, CAPILLARY
Glucose-Capillary: 123 mg/dL — ABNORMAL HIGH (ref 70–99)
Glucose-Capillary: 127 mg/dL — ABNORMAL HIGH (ref 70–99)

## 2020-11-08 MED ORDER — MEPILEX BORDER FLEX LITE EX PADS: 1.0000 | MEDICATED_PAD | Freq: Every day | CUTANEOUS | 5 refills | Status: AC

## 2020-11-08 MED ORDER — TORSEMIDE 20 MG PO TABS: 20.0000 mg | ORAL_TABLET | Freq: Every day | ORAL | Status: DC

## 2020-11-08 MED ORDER — HEPARIN SOD (PORK) LOCK FLUSH 100 UNIT/ML IV SOLN
500.0000 [IU] | INTRAVENOUS | Status: AC | PRN
  Administered 2020-11-08: 500 [IU]
  Filled 2020-11-08: qty 5

## 2020-11-08 MED ORDER — PANTOPRAZOLE SODIUM 40 MG PO TBEC: 40.0000 mg | DELAYED_RELEASE_TABLET | Freq: Two times a day (BID) | ORAL | 0 refills | Status: AC

## 2020-11-08 NOTE — Progress Notes (Signed)
Progress Note   Subjective  Chief Complaint: GI bleeding  Today, the patient's daughters found by her bedside and has a lot of questions.  She explains that on Saturday 7/16 the patient had no bowel movements at all.  Then yesterday evening she had a bowel movement with a lot of bright red blood.  Apparently this is what was happening at home to prior to admission.  Patient's daughter asked questions in regards to recent EGD and results as well as treatment.  Also asked some questions about the urinary catheter which were answered by the nurse who was also in the room.  Patient denies any new complaints or concerns.   Objective   Vital signs in last 24 hours: Temp:  [97.8 F (36.6 C)-98.3 F (36.8 C)] 97.8 F (36.6 C) (07/18 0402) Pulse Rate:  [49-105] 101 (07/18 0402) Resp:  [16-20] 16 (07/18 0402) BP: (91-120)/(65-74) 120/74 (07/18 0402) SpO2:  [95 %-98 %] 95 % (07/18 0402) Last BM Date: 11/07/20 General:    Chronically ill-appearing elderly AA female in NAD Heart:  Regular rate and rhythm; no murmurs Lungs: Respirations even and unlabored, lungs CTA bilaterally Abdomen: Again epigastric tenderness and throughout the right abdomen with a large hardened liver extending down below the umbilicus, edema Extremities:  +pitting edema to top of thigh Psych:  Cooperative. Normal mood and affect.  Intake/Output from previous day: 07/17 0701 - 07/18 0700 In: 36 [P.O.:70] Out: 0    Lab Results: Recent Labs    11/07/20 0840 11/07/20 1415 11/07/20 2002  WBC 13.6* 13.7* 13.8*  HGB 8.8* 8.9* 8.5*  HCT 27.6* 28.0* 27.1*  PLT 184 186 186   BMET Recent Labs    11/06/20 0320 11/07/20 0238 11/07/20 0400  NA 139 140 142  K 3.8 3.2* 3.8  CL 103 103 103  CO2 29 29 28   GLUCOSE 130* 126* 143*  BUN 36* 36* 33*  CREATININE 1.92* 1.79* 1.54*  CALCIUM 8.2* 8.3* 8.4*   LFT Recent Labs    11/05/20 1627  PROT 5.0*  ALBUMIN 2.2*  AST 39  ALT 16  ALKPHOS 272*  BILITOT 2.0*   BILIDIR 0.8*  IBILI 1.2*   PT/INR Recent Labs    11/06/20 0320  LABPROT 15.3*  INR 1.2    Assessment / Plan:   Assessment: 1.  GI bleed: Started on Eliquis a few weeks ago for lower extremity DVT, presented with rectal bleeding which started 7/13 and is continued off-and-on during hospitalization, EGD 7/15 with ulcers in the antrum thought to possibly could be contributing 2.  Stage IV adenocarcinoma of the colon: Most recent CT imaging March 22 with increase in bulky hepatic metastatic disease with near complete infiltration of the right hemiliver and increase in size of the colon lesion with associated increase in pericolonic nodularity and signs of peritoneal disease 3.  Recurrent left lower extremity DVT: Started on Eliquis a few weeks ago, this has been on hold during hospitalization 4.  Anemia: Chronic secondary to above, currently stable 5.  Previously placed IVC filter 6.  Coagulopathy: secondary to Eliquis/hepatic failure secondary to bulky metastatic disease, INR 1.2 on 7/16, fibrinogen normal  Plan: 1.  Ordered CBC and BMP today. 2.  Continue Pantoprazole 40 mg daily 3.  As per Dr. Carlean Purl would continue to hold Eliquis for now, question if it would be okay to restart it at a different dose when appropriate pharmacy input 4.  Pathology is still pending from EGD 5.  Further  recommendations pending results from CBC and BMP today.  Thank you for your kind consultation.   LOS: 2 days   Levin Erp  11/08/2020, 10:19 AM

## 2020-11-08 NOTE — TOC Progression Note (Addendum)
Transition of Care [facility]) - Progression Note    Patient Details  Name: Tonya [name] MRN: [id number] Date of Birth: 03-30-1941  Transition of Care [facility] CM/SW Contact  Purcell Mouton, RN Phone Number: 11/08/2020, 11:23 AM  Clinical Narrative:    Spoke with pt's daughter concerning discharge to home and transportation. Pt will need to transport by PTAR related to CVA left side weakness.        Expected Discharge Plan and Services           Expected Discharge Date: 11/08/20                                     Social Determinants of Health (SDOH) Interventions    Readmission Risk Interventions No flowsheet data found.

## 2020-11-08 NOTE — Discharge Summary (Signed)
Physician Discharge Summary  Tonya [name]:[id number] DOB: 11/14/1940 DOA: 11/04/2020  PCP: Leeroy Cha, MD  Admit date: 11/04/2020 Discharge date: 11/08/2020  Admitted From: Home Disposition: Home  Recommendations for Outpatient Follow-up:  Follow up with PCP in 1 weeks. Please obtain BMP/CBC in one week Please follow up on the following pending results: Upper GI endoscopy biopsy results are pending.  Home Health: Present at home Equipment/Devices: Hospital bed and DME available at home.  Discharge Condition: Fair CODE STATUS: DNR/DNI Diet recommendation: Regular diet.  Discharge summary:  Patient is an 80 year old female with past medical history of stage IV adenocarcinoma of the colon not tolerating chemotherapy , history of DVT late in 2021 and another early in 2022 on Eliquis and with an IVC filter, hypertension, diabetes who was brought to the ER after rectal bleeding.  She has anemia secondary to colon cancer at baseline and has received blood transfusions and iron infusions in the past.  In the ER she was hypotensive that improved with fluids.  Hemoglobin 7.6.  She did require 2 units of packed red blood cells.  EGD on 7/15 showed some antral ulcerations.  Remains severely debilitated and frail.  Treated for following medical conditions.  Acute GI bleeding, most likely from colon cancer given intermittent nature of fresh rectal bleeding.  Also has antral erosions but no active bleeding on endoscopy done 7/15.  Patient also on Eliquis in the setting of recent diagnosis of DVT. Symptomatic anemia.  - 2 units of PRBC 7/14 with appropriate response.  Hemoglobin remains 8.5-9. EGD with antral erosions, however patient has colon cancer that is probably contributing to intermittent fresh rectal bleeding. Her hemoglobin has been stable since last 72 hours, please recheck in 1 week as patient and family still wanting to use blood transfusion for symptomatic relief. Patient  was on Pepcid, changed to Protonix 40 mg twice a day.  Essential hypertension: Blood pressures remain fluctuating.  She will go back on low-dose of beta-blockers and resume torsemide for fluid balance and leg swelling.  History of DVT in a patient with metastatic colon cancer: She will not tolerate anticoagulation.  She is at her end-of-life.  Side effects outweigh benefits of therapeutic anticoagulation.  Goal of care discussion: Patient with advanced frailty and debility, bedbound status, currently remaining mostly sleepy and lethargic, recurrent anemia, developing thromboembolism in the setting of underlying cancer.  Overall very poor prognosis and nearing end-of-life. Detailed discussion with patient's daughter at the bedside and another daughter on the phone. Patient asked me today that how much more she has time left to live. Patient's daughter tearful and now accepting that mom has become frail every day, sleepy most of the time and may be nearing death. Patient's family however at this time could not come to conclusion about starting hospice care.  They want to continue blood transfusions and fluid if needed. DNR/DNI. Patient is followed by outpatient palliative care with [facility]. Family aware about [facility] hospice program and they will call them if they feel it is appropriate. We have advised patient to enroll into hospice program at home, hopefully she will be served with hospice program.  Since patient is fairly stable, no intervention needed in the hospital.  She should be able to go home today.  One of her daughter is 24/7 care provider for her. Patient can stay on sliding scale insulin until she is able to eat regular meals.  There is risk of hypoglycemia, if she is not eating well they will  stop giving her insulin.    Discharge Diagnoses:  Principal Problem:   Acute GI bleeding Active Problems:   Essential hypertension   CVA, old, hemiparesis ([place])   Iron deficiency  anemia due to chronic blood loss   Colon cancer metastasized to liver (HCC)   Diabetes ([place])   Pressure injury of skin    Discharge Instructions  Discharge Instructions     Diet general   Complete by: As directed    Discharge wound care:   Complete by: As directed    Keep back ulcers covered and dry with dressing   Increase activity slowly   Complete by: As directed       Allergies as of 11/08/2020       Reactions   Dilaudid [hydromorphone]    unconscious   Hydrocodone Swelling, Other (See Comments)   "I started swelling, became red, and passed out"   Iohexol Hives, Shortness Of Breath, Other (See Comments)   Patient developed hives and fullness in throat post injection of 125cc's Omni 300, Onset Date: 11/15/2006   Morphine And Related Other (See Comments)   Unconscious, can't talk for 2 days   Naproxen Other (See Comments)   "It made me feel out of my head"   Other Other (See Comments)   Ibuprofen Nausea Only   Propoxyphene N-acetaminophen Other (See Comments)   "I couldn't find the door to make my way out of the room- I was in misery"        Medication List     STOP taking these medications    Eliquis 5 MG Tabs tablet Generic drug: apixaban   famotidine 40 MG tablet Commonly known as: PEPCID   magic mouthwash Soln   naproxen sodium 220 MG tablet Commonly known as: ALEVE       TAKE these medications    acetaminophen 500 MG tablet Commonly known as: TYLENOL Take 500 mg by mouth every 6 (six) hours as needed for moderate pain.   B-D SINGLE USE SWABS REGULAR Pads 1 each by Does not apply route as needed (to cleanse site prior to obtaining droplet of blood for CBG's). E11.9   B-D ULTRAFINE III SHORT PEN 31G X 8 MM Misc Generic drug: Insulin Pen Needle   ketoconazole 2 % cream Commonly known as: NIZORAL Apply 1 application topically 2 (two) times daily. What changed:  when to take this reasons to take this   lidocaine-prilocaine  cream Commonly known as: EMLA Apply 1 application topically as needed for up to 30 doses. What changed:  when to take this reasons to take this   meclizine 12.5 MG tablet Commonly known as: ANTIVERT TAKE 1 TABLET BY MOUTH THREE TIMES DAILY AS NEEDED FOR DIZZINESS What changed:  how much to take how to take this when to take this reasons to take this additional instructions   Mepilex Border Flex Lite Pads Apply 1 each topically daily.   metoprolol succinate 25 MG 24 hr tablet Commonly known as: TOPROL-XL 25 mg daily.   ondansetron 8 MG tablet Commonly known as: ZOFRAN TAKE 1 TABLET(8 MG) BY MOUTH EVERY 8 HOURS AS NEEDED FOR NAUSEA OR VOMITING What changed: See the new instructions.   pantoprazole 40 MG tablet Commonly known as: PROTONIX Take 1 tablet (40 mg total) by mouth 2 (two) times daily.   prochlorperazine 10 MG tablet Commonly known as: COMPAZINE TAKE 1 TABLET(10 MG) BY MOUTH EVERY 6 HOURS AS NEEDED FOR NAUSEA OR VOMITING What changed: See the new instructions.  torsemide 20 MG tablet Commonly known as: DEMADEX Take 1 tablet (20 mg total) by mouth daily.   traMADol 50 MG tablet Commonly known as: ULTRAM TAKE 1 TABLET(50 MG) BY MOUTH EVERY 6 HOURS AS NEEDED   Tresiba FlexTouch 100 UNIT/ML FlexTouch Pen Generic drug: insulin degludec Inject 0.21 mLs (21 Units total) into the skin daily. Take 34 units on chemo days What changed:  how much to take when to take this additional instructions   True Metrix Blood Glucose Test test strip Generic drug: glucose blood TEST BLOOD SUGAR TWICE DAILY   TRUEplus Lancets 30G Misc 1 each by Does not apply route 2 (two) times daily. E11.9               Discharge Care Instructions  (From admission, onward)           Start     Ordered   11/08/20 0000  Discharge wound care:       Comments: Keep back ulcers covered and dry with dressing   11/08/20 1013            Allergies  Allergen Reactions    Dilaudid [Hydromorphone]     unconscious   Hydrocodone Swelling and Other (See Comments)    "I started swelling, became red, and passed out"   Iohexol Hives, Shortness Of Breath and Other (See Comments)    Patient developed hives and fullness in throat post injection of 125cc's Omni 300, Onset Date: 11/15/2006    Morphine And Related Other (See Comments)    Unconscious, can't talk for 2 days   Naproxen Other (See Comments)    "It made me feel out of my head"   Other Other (See Comments)   Ibuprofen Nausea Only   Propoxyphene N-Acetaminophen Other (See Comments)    "I couldn't find the door to make my way out of the room- I was in misery"    Consultations: Oncology Gastroenterology   Procedures/Studies: DG Chest Port 1 View  Result Date: 11/04/2020 CLINICAL DATA:  The patient reports right red blood per rectum this morning EXAM: PORTABLE CHEST 1 VIEW COMPARISON:  PA and lateral chest 12/02/2013. CT chest, abdomen and pelvis 07/08/2020. FINDINGS: Right Port-A-Cath is in place. The patient has moderate right and small left pleural effusions with associated basilar airspace disease. Aortic atherosclerosis is noted. Heart size is normal. No acute or focal bony abnormality. IMPRESSION: Moderate right and small left pleural effusions and basilar airspace disease are new since the most recent exam. Aortic Atherosclerosis (ICD10-I70.0). Electronically Signed   By: Inge Rise M.D.   On: 11/04/2020 14:22   (Echo, Carotid, EGD, Colonoscopy, ERCP)    Subjective: Patient seen and examined.  See above discussions.  Patient herself did not offer any complaints.  She was just sleepy at times. She asked me about how long she has her life left. Daughter was at the bedside, she dialed another daughter on the phone and we discussed in detail about her ongoing plan of care.  See goal of care discussions above.   Discharge Exam: Vitals:   11/08/20 0402 11/08/20 1042  BP: 120/74 96/70  Pulse: (!)  101 (!) 110  Resp: 16 20  Temp: 97.8 F (36.6 C) 98 F (36.7 C)  SpO2: 95% 100%   Vitals:   11/07/20 1813 11/07/20 2206 11/08/20 0402 11/08/20 1042  BP: 106/70 118/72 120/74 96/70  Pulse: 96 (!) 49 (!) 101 (!) 110  Resp: 18 20 16 20   Temp: 98 F (36.7  C) 98.3 F (36.8 C) 97.8 F (36.6 C) 98 F (36.7 C)  TempSrc: Oral Oral Oral Oral  SpO2: 98% 98% 95% 100%  Weight:      Height:        General: Frail and debilitated.  Chronically sick looking.  On room air. Cardiovascular: S1-S2 normal. Respiratory: Bilateral clear. Gastrointestinal: Soft and nontender.  Bowel sounds present. Ext: Has diffuse swelling and 3+ pitting edema both eyes.  Mild tenderness both calf muscles. Neuro: Alert and awake but lethargic.  Mostly sleeping.  Falls asleep on conversation. Reportedly has left-sided hemiparesis, she was not able to move both legs. Bedbound. Pressure Injury 11/04/20 Sacrum Stage 2 -  Partial thickness loss of dermis presenting as a shallow open injury with a red, pink wound bed without slough. open wound, red, yellow slough (Active)  11/04/20 1615  Location: Sacrum  Location Orientation:   Staging: Stage 2 -  Partial thickness loss of dermis presenting as a shallow open injury with a red, pink wound bed without slough.  Wound Description (Comments): open wound, red, yellow slough  Present on Admission: Yes   The results of significant diagnostics from this hospitalization (including imaging, microbiology, ancillary and laboratory) are listed below for reference.     Microbiology: Recent Results (from the past 240 hour(s))  SARS CORONAVIRUS 2 (TAT 6-24 HRS) Nasopharyngeal Nasopharyngeal Swab     Status: None   Collection Time: 11/04/20  2:21 PM   Specimen: Nasopharyngeal Swab  Result Value Ref Range Status   SARS Coronavirus 2 NEGATIVE NEGATIVE Final    Comment: (NOTE) SARS-CoV-2 target nucleic acids are NOT DETECTED.  The SARS-CoV-2 RNA is generally detectable in upper  and lower respiratory specimens during the acute phase of infection. Negative results do not preclude SARS-CoV-2 infection, do not rule out co-infections with other pathogens, and should not be used as the sole basis for treatment or other patient management decisions. Negative results must be combined with clinical observations, patient history, and epidemiological information. The expected result is Negative.  Fact Sheet for Patients: SugarRoll.be  Fact Sheet for Healthcare Providers: https://www.woods-mathews.com/  This test is not yet approved or cleared by the [place] FDA and  has been authorized for detection and/or diagnosis of SARS-CoV-2 by FDA under an Emergency Use Authorization (EUA). This EUA will remain  in effect (meaning this test can be used) for the duration of the COVID-19 declaration under Se ction 564(b)(1) of the Act, 21 U.S.C. section 360bbb-3(b)(1), unless the authorization is terminated or revoked sooner.  Performed at [facility], [place] [address], [place], [place] [address]      Labs: BNP (last 3 results) No results for input(s): BNP in the last 8760 hours. Basic Metabolic Panel: Recent Labs  Lab 11/04/20 1252 11/05/20 0452 11/06/20 0320 11/07/20 0238 11/07/20 0400  NA 140 139 139 140 142  K 4.0 4.1 3.8 3.2* 3.8  CL 102 101 103 103 103  CO2 28 28 29 29 28   GLUCOSE 149* 122* 130* 126* 143*  BUN 30* 32* 36* 36* 33*  CREATININE 1.49* 1.61* 1.92* 1.79* 1.54*  CALCIUM 8.7* 8.4* 8.2* 8.3* 8.4*   Liver Function Tests: Recent Labs  Lab 11/04/20 1252 11/05/20 1627  AST 40 39  ALT 16 16  ALKPHOS 261* 272*  BILITOT 1.0 2.0*  PROT 4.8* 5.0*  ALBUMIN 2.1* 2.2*   No results for input(s): LIPASE, AMYLASE in the last 168 hours. No results for input(s): AMMONIA in the last 168 hours.  CBC: Recent Labs  Lab 11/04/20 1252 11/05/20 0452 11/06/20 0320 11/06/20 1450 11/07/20 0238 11/07/20 0400  11/07/20 0840 11/07/20 1415 11/07/20 2002  WBC 13.5*   < > 13.9*   < > 13.5* 14.0* 13.6* 13.7* 13.8*  NEUTROABS 11.1*  --  11.2*  --   --   --   --   --   --   HGB 7.6*   < > 8.3*   < > 8.5* 8.4* 8.8* 8.9* 8.5*  HCT 24.8*   < > 25.9*   < > 27.1* 26.7* 27.6* 28.0* 27.1*  MCV 95.4   < > 93.8   < > 95.1 97.1 94.8 95.2 96.8  PLT 247   < > 195   < > 185 172 184 186 186   < > = values in this interval not displayed.   Cardiac Enzymes: No results for input(s): CKTOTAL, CKMB, CKMBINDEX, TROPONINI in the last 168 hours. BNP: Invalid input(s): POCBNP CBG: Recent Labs  Lab 11/07/20 1628 11/07/20 2043 11/07/20 2202 11/08/20 0742 11/08/20 1112  GLUCAP 145* 177* 151* 123* 127*   D-Dimer No results for input(s): DDIMER in the last 72 hours. Hgb A1c No results for input(s): HGBA1C in the last 72 hours.  Lipid Profile No results for input(s): CHOL, HDL, LDLCALC, TRIG, CHOLHDL, LDLDIRECT in the last 72 hours. Thyroid function studies No results for input(s): TSH, T4TOTAL, T3FREE, THYROIDAB in the last 72 hours.  Invalid input(s): FREET3 Anemia work up No results for input(s): VITAMINB12, FOLATE, FERRITIN, TIBC, IRON, RETICCTPCT in the last 72 hours.  Urinalysis    Component Value Date/Time   COLORURINE STRAW (A) 10/15/2018 2143   APPEARANCEUR CLEAR 10/15/2018 2143   LABSPEC 1.010 10/15/2018 2143   PHURINE 8.0 10/15/2018 2143   GLUCOSEU NEGATIVE 10/15/2018 2143   GLUCOSEU >=1000 (A) 10/10/2017 1216   HGBUR NEGATIVE 10/15/2018 2143   BILIRUBINUR NEGATIVE 10/15/2018 2143   BILIRUBINUR negative (A) 05/31/2018 1712   KETONESUR 5 (A) 10/15/2018 2143   PROTEINUR NEGATIVE 09/09/2019 1125   UROBILINOGEN 1.0 05/31/2018 1712   UROBILINOGEN 1.0 10/10/2017 1216   NITRITE NEGATIVE 10/15/2018 2143   LEUKOCYTESUR NEGATIVE 10/15/2018 2143   Sepsis Labs Invalid input(s): PROCALCITONIN,  WBC,  LACTICIDVEN Microbiology Recent Results (from the past 240 hour(s))  SARS CORONAVIRUS 2 (TAT 6-24  HRS) Nasopharyngeal Nasopharyngeal Swab     Status: None   Collection Time: 11/04/20  2:21 PM   Specimen: Nasopharyngeal Swab  Result Value Ref Range Status   SARS Coronavirus 2 NEGATIVE NEGATIVE Final    Comment: (NOTE) SARS-CoV-2 target nucleic acids are NOT DETECTED.  The SARS-CoV-2 RNA is generally detectable in upper and lower respiratory specimens during the acute phase of infection. Negative results do not preclude SARS-CoV-2 infection, do not rule out co-infections with other pathogens, and should not be used as the sole basis for treatment or other patient management decisions. Negative results must be combined with clinical observations, patient history, and epidemiological information. The expected result is Negative.  Fact Sheet for Patients: SugarRoll.be  Fact Sheet for Healthcare Providers: https://www.woods-mathews.com/  This test is not yet approved or cleared by the [place] FDA and  has been authorized for detection and/or diagnosis of SARS-CoV-2 by FDA under an Emergency Use Authorization (EUA). This EUA will remain  in effect (meaning this test can be used) for the duration of the COVID-19 declaration under Se ction 564(b)(1) of the Act, 21 U.S.C. section 360bbb-3(b)(1), unless the authorization is terminated or revoked sooner.  Performed at [facility], [place] [address], [place], [place] [address]      Time coordinating discharge:  50 minutes  SIGNED:   Barb Merino, MD  [facility] 11/08/2020, 2:26 PM

## 2020-11-08 NOTE — Consult Note (Addendum)
Referral MD  Reason for Referral: Metastatic colorectal cancer-refractory; GI bleeding  Chief Complaint  Patient presents with   Rectal Bleeding  : I had some bleeding.  HPI: Ms. Althouse is well-known to me.  She is a very nice 80 year old African-American female.  She has metastatic colorectal cancer.  She has been through all therapies.  She has extremely poor performance status.  We try to get hospice for her but her family declined hospice for the fact that she still needed to be transfused.  She basically is bedbound.  Patient was admitted last week.  She had GI bleeding.  She was seen by gastroenterology.  Thinks she may have been on Eliquis.  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  She is not eating much.  She has incredibly low prealbumin.  Her last prealbumin back in June was 6.4.  When I saw this morning, she would not open her eyes much.  She could not say little bit to me.  She is not obviously hurting.  She has leg swelling.  She did undergo an upper endoscopy.  This showed some ulcerated mucosa.  Biopsies were taken which did show some hyperplastic changes.  There is no malignancy.  I suspect that she is on some proton pump inhibitor now.  She has been seen by palliative care.  Again were trying to do what we can do to make sure she is comfortable.  Unfortunately, her family has just not been willing to get her onto Hospice.  I know hospice would really be able to help out quite a bit.  Currently, I would say performance status is by ECOG 4.  Past Medical History:  Diagnosis Date   Cerebrovascular accident [facility]) 2001   left side weakness   Chronic edema    ? venous insufficiency   Colon cancer ([place]) 2020   COPD (chronic obstructive pulmonary disease) ([place])    never had a problem breathing   Diabetes mellitus without complication ([place])    Dyspnea    Enlarged heart    per pt   Gallstones    GERD (gastroesophageal reflux disease)    Goals  of care, counseling/discussion 11/01/2018   History of radiation therapy 04/08/2020-05/21/2020   Colon;Dr. Gery Pray   Hyperlipidemia    Hypertension    PONV (postoperative nausea and vomiting)    nausea   :   Past Surgical History:  Procedure Laterality Date   ABDOMINAL HYSTERECTOMY     BIOPSY  10/18/2018   Procedure: BIOPSY;  Surgeon: Jackquline Denmark, MD;  Location: [facility] ENDOSCOPY;  Service: Endoscopy;;   BIOPSY  11/05/2020   Procedure: BIOPSY;  Surgeon: Gatha Mayer, MD;  Location: [facility] ENDOSCOPY;  Service: Endoscopy;;   CHOLECYSTECTOMY     COLONOSCOPY WITH PROPOFOL N/A 10/18/2018   Procedure: COLONOSCOPY WITH PROPOFOL;  Surgeon: Jackquline Denmark, MD;  Location: [facility];  Service: Endoscopy;  Laterality: N/A;   ESOPHAGOGASTRODUODENOSCOPY (EGD) WITH PROPOFOL N/A 11/05/2020   Procedure: ESOPHAGOGASTRODUODENOSCOPY (EGD) WITH PROPOFOL;  Surgeon: Gatha Mayer, MD;  Location: [facility] ENDOSCOPY;  Service: Endoscopy;  Laterality: N/A;   IR IMAGING GUIDED PORT INSERTION  11/11/2018   IR IVC FILTER PLMT / S&I /IMG GUID/MOD SED  02/11/2020   POLYPECTOMY  10/18/2018   Procedure: POLYPECTOMY;  Surgeon: Jackquline Denmark, MD;  Location: [facility] ENDOSCOPY;  Service: Endoscopy;;   SUBMUCOSAL TATTOO INJECTION  10/18/2018   Procedure: SUBMUCOSAL TATTOO INJECTION;  Surgeon: Jackquline Denmark, MD;  Location: [facility] ENDOSCOPY;  Service: Endoscopy;;  :  Current Facility-Administered Medications:    acetaminophen (TYLENOL) tablet 650 mg, 650 mg, Oral, Q6H PRN, Gatha Mayer, MD, 650 mg at 11/08/20 1236   Chlorhexidine Gluconate Cloth 2 % PADS 6 each, 6 each, Topical, Daily, Gatha Mayer, MD, 6 each at 11/08/20 1311   insulin aspart (novoLOG) injection 0-15 Units, 0-15 Units, Subcutaneous, TID WC, Gatha Mayer, MD, 2 Units at 11/08/20 1255   insulin aspart (novoLOG) injection 0-5 Units, 0-5 Units, Subcutaneous, QHS, Gatha Mayer, MD   NONFORMULARY OR COMPOUNDED ITEM 1 application, 1 application, Topical, QHS,  Gatha Mayer, MD, 1 application at 95/09/32 2100   ondansetron [facility]) injection 4 mg, 4 mg, Intravenous, Q6H PRN, Shelda Pal, DO, 4 mg at 11/07/20 1507   pantoprazole (PROTONIX) EC tablet 40 mg, 40 mg, Oral, BID, Shelda Pal, DO, 40 mg at 11/08/20 6712   promethazine (PHENERGAN) 6.25 mg in sodium chloride 0.9 % 50 mL IVPB, 6.25 mg, Intravenous, Q6H PRN, Shelda Pal, DO   sodium chloride flush (NS) 0.9 % injection 10-40 mL, 10-40 mL, Intracatheter, Q12H, Gatha Mayer, MD, 10 mL at 11/08/20 1113:   Chlorhexidine Gluconate Cloth  6 each Topical Daily   insulin aspart  0-15 Units Subcutaneous TID WC   insulin aspart  0-5 Units Subcutaneous QHS   NONFORMULARY OR COMPOUNDED ITEM 1 application  1 application Topical QHS   pantoprazole  40 mg Oral BID   sodium chloride flush  10-40 mL Intracatheter Q12H  :   Allergies  Allergen Reactions   Dilaudid [Hydromorphone]     unconscious   Hydrocodone Swelling and Other (See Comments)    "I started swelling, became red, and passed out"   Iohexol Hives, Shortness Of Breath and Other (See Comments)    Patient developed hives and fullness in throat post injection of 125cc's Omni 300, Onset Date: 11/15/2006    Morphine And Related Other (See Comments)    Unconscious, can't talk for 2 days   Naproxen Other (See Comments)    "It made me feel out of my head"   Other Other (See Comments)   Ibuprofen Nausea Only   Propoxyphene N-Acetaminophen Other (See Comments)    "I couldn't find the door to make my way out of the room- I was in misery"  :   Family History  Problem Relation Age of Onset   Cirrhosis Mother    Heart disease Father    Cancer Other    Hypertension Other    Diabetes Other   :   Social History   Socioeconomic History   Marital status: Widowed    Spouse name: Not on file   Number of children: 5   Years of education: 9   Highest education level: 9th grade  Occupational History    Occupation: Retired  Tobacco Use   Smoking status: Former    Packs/day: 0.25    Types: Cigarettes    Quit date: 07/23/1968    Years since quitting: 52.3   Smokeless tobacco: Never   Tobacco comments:    6 MONTH  Vaping Use   Vaping Use: Never used  Substance and Sexual Activity   Alcohol use: No    Alcohol/week: 0.0 standard drinks   Drug use: No   Sexual activity: Not Currently  Other Topics Concern   Not on file  Social History Narrative   Not on file   Social Determinants of Health   Financial Resource Strain: Low Risk  Difficulty of Paying Living Expenses: Not hard at all  Food Insecurity: No Food Insecurity   Worried About [place] in the Last Year: Never true   Ran Out of Food in the Last Year: Never true  Transportation Needs: No Transportation Needs   Lack of Transportation (Medical): No   Lack of Transportation (Non-Medical): No  Physical Activity: Inactive   Days of Exercise per Week: 0 days   Minutes of Exercise per Session: 0 min  Stress: No Stress Concern Present   Feeling of Stress : Not at all  Social Connections: Moderately Isolated   Frequency of Communication with Friends and Family: More than three times a week   Frequency of Social Gatherings with Friends and Family: More than three times a week   Attends Religious Services: More than 4 times per year   Active Member of [employer] or Organizations: No   Attends Archivist Meetings: Never   Marital Status: Widowed  Human resources officer Violence: Not At Risk   Fear of Current or Ex-Partner: No   Emotionally Abused: No   Physically Abused: No   Sexually Abused: No  : Review of Systems  Constitutional:  Positive for malaise/fatigue.  HENT: Negative.    Eyes: Negative.   Respiratory:  Positive for shortness of breath.   Cardiovascular:  Positive for leg swelling.  Gastrointestinal:  Positive for abdominal pain and blood in stool.  Genitourinary: Negative.   Musculoskeletal:  Negative.   Skin: Negative.   Neurological: Negative.   Endo/Heme/Allergies: Negative.   Psychiatric/Behavioral: Negative.      Exam: Patient Vitals for the past 24 hrs:  BP Temp Temp src Pulse Resp SpO2  11/08/20 1042 96/70 98 F (36.7 C) Oral (!) 110 20 100 %  11/08/20 0402 120/74 97.8 F (36.6 C) Oral (!) 101 16 95 %  11/07/20 2206 118/72 98.3 F (36.8 C) Oral (!) 49 20 98 %  11/07/20 1813 106/70 98 F (36.7 C) Oral 96 18 98 %   Physical Exam Vitals reviewed.  HENT:     Head: Normocephalic and atraumatic.  Eyes:     Pupils: Pupils are equal, round, and reactive to light.  Cardiovascular:     Rate and Rhythm: Normal rate and regular rhythm.     Heart sounds: Normal heart sounds.  Pulmonary:     Effort: Pulmonary effort is normal.     Breath sounds: Normal breath sounds.  Abdominal:     General: Bowel sounds are normal.     Palpations: Abdomen is soft.  Musculoskeletal:        General: No tenderness or deformity. Normal range of motion.     Cervical back: Normal range of motion.  Lymphadenopathy:     Cervical: No cervical adenopathy.  Skin:    General: Skin is warm and dry.     Findings: No erythema or rash.  Neurological:     Mental Status: She is alert and oriented to person, place, and time.  Psychiatric:        Behavior: Behavior normal.        Thought Content: Thought content normal.        Judgment: Judgment normal.      Recent Labs    11/07/20 1415 11/07/20 2002  WBC 13.7* 13.8*  HGB 8.9* 8.5*  HCT 28.0* 27.1*  PLT 186 186    Recent Labs    11/07/20 0238 11/07/20 0400  NA 140 142  K 3.2* 3.8  CL 103  103  CO2 29 28  GLUCOSE 126* 143*  BUN 36* 33*  CREATININE 1.79* 1.54*  CALCIUM 8.3* 8.4*    Blood smear review: None  Pathology: See above    Assessment and Plan: Ms. Yglesias is a very nice 80 year old African-American female.  She has end-stage colon cancer.  Again, there is no other therapy that she can be offered.  She just is not  able to tolerate any therapy.  She is incredibly weak with a very poor performance status.  She is bedbound.  Of note, she has had a past stroke.  I just wish that we can get her family to agree to Hospice.  I just am not sure that transfused her is really going to help her quality of life.  Her family just is having a hard time letter pass on peacefully.  I know they have been very attentive.  They have really been there for her and have done a great job trying to help her out.  There is very little that we can do at this point.  She seems fairly comfortable.  We will follow along and try to help out in any way.  Lattie Haw, MD

## 2020-11-08 NOTE — Plan of Care (Signed)
  Problem: Clinical Measurements: Goal: Respiratory complications will improve Outcome: Progressing Goal: Cardiovascular complication will be avoided Outcome: Progressing   Problem: Nutrition: Goal: Adequate nutrition will be maintained Outcome: Progressing   

## 2020-11-09 ENCOUNTER — Ambulatory Visit: Payer: Medicare HMO | Admitting: Podiatry

## 2020-11-15 ENCOUNTER — Telehealth: Payer: Self-pay

## 2020-11-15 DIAGNOSIS — R410 Disorientation, unspecified: Secondary | ICD-10-CM | POA: Diagnosis not present

## 2020-11-15 DIAGNOSIS — R0602 Shortness of breath: Secondary | ICD-10-CM | POA: Diagnosis not present

## 2020-11-15 DIAGNOSIS — C189 Malignant neoplasm of colon, unspecified: Secondary | ICD-10-CM | POA: Diagnosis not present

## 2020-11-15 DIAGNOSIS — I10 Essential (primary) hypertension: Secondary | ICD-10-CM | POA: Diagnosis not present

## 2020-11-15 DIAGNOSIS — R42 Dizziness and giddiness: Secondary | ICD-10-CM | POA: Diagnosis not present

## 2020-11-15 DIAGNOSIS — E1169 Type 2 diabetes mellitus with other specified complication: Secondary | ICD-10-CM | POA: Diagnosis not present

## 2020-11-15 DIAGNOSIS — K5901 Slow transit constipation: Secondary | ICD-10-CM | POA: Diagnosis not present

## 2020-11-15 DIAGNOSIS — K254 Chronic or unspecified gastric ulcer with hemorrhage: Secondary | ICD-10-CM | POA: Diagnosis not present

## 2020-11-15 NOTE — Telephone Encounter (Signed)
Received message from daughter requesting a return call. Call placed to patient's daughter. Left VM with call back information.

## 2020-11-15 NOTE — Telephone Encounter (Signed)
Phone call placed to patient's daughter, [name]. [name] shared that she would like to schedule a visit with Palliative care. Patient was d/c from hospital on 11/08/20 and does not have home health in place anymore. Daughter questioning if patient has UTI as she is displaying increased confusion and foul smelling urine. Patient has decreased fluid intake and not able to get up to the bathroom as she once was. Per daughter, PCP was notified of this concern and informed daughter to monitor temperature and blood pressure. Visit with Palliative care scheduled for 11/16/20 @ 12:30pm with Mon[place].

## 2020-11-16 ENCOUNTER — Ambulatory Visit: Payer: Medicare HMO

## 2020-11-16 ENCOUNTER — Other Ambulatory Visit: Payer: Self-pay

## 2020-11-16 ENCOUNTER — Other Ambulatory Visit: Payer: Medicare HMO

## 2020-11-16 ENCOUNTER — Other Ambulatory Visit: Payer: Medicare HMO | Admitting: *Deleted

## 2020-11-16 ENCOUNTER — Ambulatory Visit: Payer: Medicare HMO | Admitting: Hematology & Oncology

## 2020-11-16 VITALS — BP 115/81 | HR 88 | Temp 97.9°F | Resp 18

## 2020-11-16 DIAGNOSIS — Z515 Encounter for palliative care: Secondary | ICD-10-CM

## 2020-11-16 DIAGNOSIS — R410 Disorientation, unspecified: Secondary | ICD-10-CM | POA: Diagnosis not present

## 2020-11-16 NOTE — Progress Notes (Signed)
COMMUNITY PALLIATIVE CARE SW NOTE  PATIENT NAME: Tonya [name] DOB: 1940-08-25 MRN: [id number]  PRIMARY CARE PROVIDER: Leeroy Cha, MD  RESPONSIBLE PARTY:  Acct ID - Guarantor Home Phone Work Phone Relationship Acct Type  [id number] - Woloszyn,[phone]  Self P/F     [address] [place], [place], [place] [id number]     PLAN OF CARE and INTERVENTIONS:             GOALS OF CARE/ ADVANCE CARE PLANNING: Goal is for patient to remain at home, receiving transfusions and blood when needed. Patient has a DNR on file.  SOCIAL/EMOTIONAL/SPIRITUAL ASSESSMENT/ INTERVENTIONS:  SW and RN-M. Nadara Mustard completed a follow-up visit with patient at her home. Her daughter-[name] was present with her. The visit was requested by daughter as she wanted a urinalysis done on patient as she feels patient has a UTI. [name] provided medical background on patient and provided a current status update.She report that patient has been sleeping a lot. Her appetite is decreased, describing that patient eats  3 x's a day, but only a 2/3 spoonful of each meal. She has pain to her belly, which the daughter massages for comfort and will give patient (1 ) 500 mg extra-strength Tylenol. Patient has tramadol, but daughter reports she does not like to give it to patient as it makes her more confused and sleepy where she does not want to eat. She report that patient has fluid in her legs. Patient has intermittent constipation. The daughter also reports that patient has some wheezing when she sits up, but does not hear this when patient is laying down. SW and RN visited patient in her room, where she was present in her hospital bed. She was alert and oriented to self, but appeared to be weak and fatigued. She was responsive to simple yes/no questions, but her responses indicated her faith also. [name] report caregiver fatigue, along with the difficulty of managing patient's personal care as she has her own health issues that she is  contending with. SW provided the daughter with name and number of local resource agency that could assist her with obtaining in-home care. The team expressed reassurance of support of patient and daughter, while re-enforcing role of palliative care in patient's care and visit frequency. Daughter and patient remain open to ongoing visits and support by the palliative care team.  PATIENT/CAREGIVER EDUCATION/ COPING:  Daughter expressed her faith beliefs, which impacts the decision and care provided for patient. The daughter advised that she wants patient to receive blood or transfusions if at anytime she needs them.  PERSONAL EMERGENCY PLAN:  911 can be activated for emergencies.  COMMUNITY RESOURCES COORDINATION/ HEALTH CARE NAVIGATION:  None. FINANCIAL/LEGAL CONCERNS/INTERVENTIONS:  None.     SOCIAL HX:  Social History   Tobacco Use   Smoking status: Former    Packs/day: 0.25    Types: Cigarettes    Quit date: 07/23/1968    Years since quitting: 52.3   Smokeless tobacco: Never   Tobacco comments:    6 MONTH  Substance Use Topics   Alcohol use: No    Alcohol/week: 0.0 standard drinks    CODE STATUS: A DNR is on file ADVANCED DIRECTIVES: No MOST FORM COMPLETE:  No HOSPICE EDUCATION PROVIDED: No  PPS: Patient is bedbound, weak and appears to be easily fatigued.      [address] [place], [place]

## 2020-11-18 ENCOUNTER — Telehealth: Payer: Self-pay | Admitting: Cardiology

## 2020-11-18 NOTE — Telephone Encounter (Signed)
As long as she gets relief that should be fine. Elevated heart rate is due to anemia and her lung issues. Call if recurrent.  I sent her my chart message and ask if she got it. Good practice to ask patients if they use my chart and encourage this

## 2020-11-18 NOTE — Telephone Encounter (Signed)
Pts daughter called and stated that earlier today the pts BP was 113/92 and pulse 107. Pt was having chest pain in the upper left side and had pain in her shoulder blade. No jaw or arm pain. Pain lasted 2 hours. Pts daughter massaged her and rubbed BioFreeze on her and the pain went away. Pt has taken all of her medication today. Pt was not dizzy and was not short of breath. Pts pulse is now 103. Please advise.

## 2020-11-18 NOTE — Telephone Encounter (Signed)
7/28 PT having chest pain, upper left side. BP 113/92, Pulse 107 per PT. Would like to speak with JG's CMA ASAP. [facility]

## 2020-11-19 ENCOUNTER — Telehealth: Payer: Self-pay | Admitting: *Deleted

## 2020-11-19 NOTE — Telephone Encounter (Signed)
Received a voicemail from patient's daughter, [name], stating that patient's pulse has been in the 100s all day yesterday and she is concerned. I returned the call to [facility]. She states that the highest pulse has been 107 bpm. I asked if patient was experiencing any pain. [name] states patient had not c/o any pain during the day, but did have significant pain last night. Advised that pain can cause an elevated pulse. She is currently taking Metoprolol 25 mg daily. During conversation, patient started to c/o abdominal pain. Daughter gave patient one Tylenol 500 mg tablet. She does not like giving Tramadol and she says it causes lethargy and increased confusion. Her pulse today is around 88-92. Advised I would check back with her later today.   3:44pm Called to follow up on how patient has been doing today. She says she has been resting and her pulse has been around 87-88 since I spoke with her earlier today. The only complaint she has had is dysuria. The daughter says that the urine specimen I dropped off at her PCPs lab earlier this week was positive for a UTI. An antibiotic prescription has been sent to patient's pharmacy today. Daughter will pick it up today so patient can get started on this. She is appreciative of follow up.

## 2020-11-19 NOTE — Telephone Encounter (Signed)
Called and spoke to pts daughter, she voiced understanding and will pass the message onto the pt.

## 2020-11-22 ENCOUNTER — Encounter (HOSPITAL_COMMUNITY): Payer: Self-pay

## 2020-11-22 ENCOUNTER — Inpatient Hospital Stay (HOSPITAL_COMMUNITY)
Admission: EM | Admit: 2020-11-22 | Discharge: 2020-11-24 | DRG: 843 | Disposition: A | Discharge status: Home Health | Payer: Medicare HMO | Attending: Internal Medicine | Admitting: Internal Medicine

## 2020-11-22 ENCOUNTER — Emergency Department (HOSPITAL_COMMUNITY): Payer: Medicare HMO

## 2020-11-22 ENCOUNTER — Other Ambulatory Visit: Payer: Self-pay

## 2020-11-22 DIAGNOSIS — I10 Essential (primary) hypertension: Secondary | ICD-10-CM

## 2020-11-22 DIAGNOSIS — Z79899 Other long term (current) drug therapy: Secondary | ICD-10-CM

## 2020-11-22 DIAGNOSIS — Z20822 Contact with and (suspected) exposure to covid-19: Secondary | ICD-10-CM | POA: Diagnosis present

## 2020-11-22 DIAGNOSIS — Z87891 Personal history of nicotine dependence: Secondary | ICD-10-CM

## 2020-11-22 DIAGNOSIS — K219 Gastro-esophageal reflux disease without esophagitis: Secondary | ICD-10-CM | POA: Diagnosis present

## 2020-11-22 DIAGNOSIS — I110 Hypertensive heart disease with heart failure: Secondary | ICD-10-CM | POA: Diagnosis not present

## 2020-11-22 DIAGNOSIS — Z7401 Bed confinement status: Secondary | ICD-10-CM

## 2020-11-22 DIAGNOSIS — I69359 Hemiplegia and hemiparesis following cerebral infarction affecting unspecified side: Secondary | ICD-10-CM

## 2020-11-22 DIAGNOSIS — Z7189 Other specified counseling: Secondary | ICD-10-CM | POA: Diagnosis not present

## 2020-11-22 DIAGNOSIS — Z794 Long term (current) use of insulin: Secondary | ICD-10-CM

## 2020-11-22 DIAGNOSIS — I340 Nonrheumatic mitral (valve) insufficiency: Secondary | ICD-10-CM | POA: Diagnosis present

## 2020-11-22 DIAGNOSIS — J910 Malignant pleural effusion: Secondary | ICD-10-CM | POA: Diagnosis present

## 2020-11-22 DIAGNOSIS — C189 Malignant neoplasm of colon, unspecified: Secondary | ICD-10-CM | POA: Diagnosis not present

## 2020-11-22 DIAGNOSIS — Z28310 Unvaccinated for covid-19: Secondary | ICD-10-CM

## 2020-11-22 DIAGNOSIS — I421 Obstructive hypertrophic cardiomyopathy: Secondary | ICD-10-CM | POA: Diagnosis not present

## 2020-11-22 DIAGNOSIS — R531 Weakness: Secondary | ICD-10-CM | POA: Diagnosis not present

## 2020-11-22 DIAGNOSIS — R0602 Shortness of breath: Secondary | ICD-10-CM | POA: Diagnosis not present

## 2020-11-22 DIAGNOSIS — Z86718 Personal history of other venous thrombosis and embolism: Secondary | ICD-10-CM | POA: Diagnosis not present

## 2020-11-22 DIAGNOSIS — E119 Type 2 diabetes mellitus without complications: Secondary | ICD-10-CM | POA: Diagnosis present

## 2020-11-22 DIAGNOSIS — Z8249 Family history of ischemic heart disease and other diseases of the circulatory system: Secondary | ICD-10-CM

## 2020-11-22 DIAGNOSIS — D649 Anemia, unspecified: Secondary | ICD-10-CM | POA: Diagnosis not present

## 2020-11-22 DIAGNOSIS — R410 Disorientation, unspecified: Secondary | ICD-10-CM | POA: Diagnosis not present

## 2020-11-22 DIAGNOSIS — J449 Chronic obstructive pulmonary disease, unspecified: Secondary | ICD-10-CM | POA: Diagnosis present

## 2020-11-22 DIAGNOSIS — R601 Generalized edema: Secondary | ICD-10-CM | POA: Diagnosis not present

## 2020-11-22 DIAGNOSIS — I5033 Acute on chronic diastolic (congestive) heart failure: Secondary | ICD-10-CM | POA: Diagnosis present

## 2020-11-22 DIAGNOSIS — Z91041 Radiographic dye allergy status: Secondary | ICD-10-CM

## 2020-11-22 DIAGNOSIS — L89152 Pressure ulcer of sacral region, stage 2: Secondary | ICD-10-CM | POA: Diagnosis present

## 2020-11-22 DIAGNOSIS — Z923 Personal history of irradiation: Secondary | ICD-10-CM

## 2020-11-22 DIAGNOSIS — L89159 Pressure ulcer of sacral region, unspecified stage: Secondary | ICD-10-CM

## 2020-11-22 DIAGNOSIS — Z886 Allergy status to analgesic agent status: Secondary | ICD-10-CM

## 2020-11-22 DIAGNOSIS — L8990 Pressure ulcer of unspecified site, unspecified stage: Secondary | ICD-10-CM | POA: Diagnosis present

## 2020-11-22 DIAGNOSIS — Z885 Allergy status to narcotic agent status: Secondary | ICD-10-CM

## 2020-11-22 DIAGNOSIS — Z66 Do not resuscitate: Secondary | ICD-10-CM | POA: Diagnosis not present

## 2020-11-22 DIAGNOSIS — I69354 Hemiplegia and hemiparesis following cerebral infarction affecting left non-dominant side: Secondary | ICD-10-CM | POA: Diagnosis not present

## 2020-11-22 DIAGNOSIS — E785 Hyperlipidemia, unspecified: Secondary | ICD-10-CM | POA: Diagnosis present

## 2020-11-22 DIAGNOSIS — I509 Heart failure, unspecified: Secondary | ICD-10-CM | POA: Diagnosis not present

## 2020-11-22 DIAGNOSIS — D72829 Elevated white blood cell count, unspecified: Secondary | ICD-10-CM | POA: Diagnosis present

## 2020-11-22 DIAGNOSIS — C787 Secondary malignant neoplasm of liver and intrahepatic bile duct: Secondary | ICD-10-CM

## 2020-11-22 DIAGNOSIS — Z833 Family history of diabetes mellitus: Secondary | ICD-10-CM

## 2020-11-22 DIAGNOSIS — E8770 Fluid overload, unspecified: Secondary | ICD-10-CM

## 2020-11-22 DIAGNOSIS — J90 Pleural effusion, not elsewhere classified: Secondary | ICD-10-CM

## 2020-11-22 DIAGNOSIS — Z95828 Presence of other vascular implants and grafts: Secondary | ICD-10-CM

## 2020-11-22 DIAGNOSIS — E1149 Type 2 diabetes mellitus with other diabetic neurological complication: Secondary | ICD-10-CM

## 2020-11-22 DIAGNOSIS — R069 Unspecified abnormalities of breathing: Secondary | ICD-10-CM | POA: Diagnosis not present

## 2020-11-22 DIAGNOSIS — R7989 Other specified abnormal findings of blood chemistry: Secondary | ICD-10-CM | POA: Diagnosis present

## 2020-11-22 DIAGNOSIS — K254 Chronic or unspecified gastric ulcer with hemorrhage: Secondary | ICD-10-CM | POA: Diagnosis not present

## 2020-11-22 DIAGNOSIS — Z90710 Acquired absence of both cervix and uterus: Secondary | ICD-10-CM

## 2020-11-22 DIAGNOSIS — Z515 Encounter for palliative care: Secondary | ICD-10-CM | POA: Diagnosis not present

## 2020-11-22 DIAGNOSIS — E8809 Other disorders of plasma-protein metabolism, not elsewhere classified: Secondary | ICD-10-CM | POA: Diagnosis not present

## 2020-11-22 DIAGNOSIS — R5383 Other fatigue: Secondary | ICD-10-CM | POA: Diagnosis not present

## 2020-11-22 LAB — CBC WITH DIFFERENTIAL/PLATELET
Abs Immature Granulocytes: 0.07 10*3/uL (ref 0.00–0.07)
Basophils Absolute: 0 10*3/uL (ref 0.0–0.1)
Basophils Relative: 0 %
Eosinophils Absolute: 0 10*3/uL (ref 0.0–0.5)
Eosinophils Relative: 0 %
HCT: 31.6 % — ABNORMAL LOW (ref 36.0–46.0)
Hemoglobin: 9.5 g/dL — ABNORMAL LOW (ref 12.0–15.0)
Immature Granulocytes: 1 %
Lymphocytes Relative: 4 %
Lymphs Abs: 0.5 10*3/uL — ABNORMAL LOW (ref 0.7–4.0)
MCH: 28.4 pg (ref 26.0–34.0)
MCHC: 30.1 g/dL (ref 30.0–36.0)
MCV: 94.6 fL (ref 80.0–100.0)
Monocytes Absolute: 1.5 10*3/uL — ABNORMAL HIGH (ref 0.1–1.0)
Monocytes Relative: 12 %
Neutro Abs: 10.3 10*3/uL — ABNORMAL HIGH (ref 1.7–7.7)
Neutrophils Relative %: 83 %
Platelets: 247 10*3/uL (ref 150–400)
RBC: 3.34 MIL/uL — ABNORMAL LOW (ref 3.87–5.11)
RDW: 16.6 % — ABNORMAL HIGH (ref 11.5–15.5)
WBC: 12.4 10*3/uL — ABNORMAL HIGH (ref 4.0–10.5)
nRBC: 0 % (ref 0.0–0.2)

## 2020-11-22 LAB — URINALYSIS, ROUTINE W REFLEX MICROSCOPIC
Bilirubin Urine: NEGATIVE
Glucose, UA: NEGATIVE mg/dL
Hgb urine dipstick: NEGATIVE
Ketones, ur: NEGATIVE mg/dL
Leukocytes,Ua: NEGATIVE
Nitrite: NEGATIVE
Protein, ur: NEGATIVE mg/dL
Specific Gravity, Urine: 1.006 (ref 1.005–1.030)
pH: 8 (ref 5.0–8.0)

## 2020-11-22 LAB — COMPREHENSIVE METABOLIC PANEL
ALT: 18 U/L (ref 0–44)
AST: 42 U/L — ABNORMAL HIGH (ref 15–41)
Albumin: 2.2 g/dL — ABNORMAL LOW (ref 3.5–5.0)
Alkaline Phosphatase: 388 U/L — ABNORMAL HIGH (ref 38–126)
Anion gap: 8 (ref 5–15)
BUN: 24 mg/dL — ABNORMAL HIGH (ref 8–23)
CO2: 29 mmol/L (ref 22–32)
Calcium: 9.2 mg/dL (ref 8.9–10.3)
Chloride: 105 mmol/L (ref 98–111)
Creatinine, Ser: 0.92 mg/dL (ref 0.44–1.00)
GFR, Estimated: >60 mL/min (ref >60)
Glucose, Bld: 167 mg/dL — ABNORMAL HIGH (ref 70–99)
Potassium: 3.7 mmol/L (ref 3.5–5.1)
Sodium: 142 mmol/L (ref 135–145)
Total Bilirubin: 0.9 mg/dL (ref 0.3–1.2)
Total Protein: 5.3 g/dL — ABNORMAL LOW (ref 6.5–8.1)

## 2020-11-22 LAB — TYPE AND SCREEN
ABO/RH(D): A POS
Antibody Screen: POSITIVE

## 2020-11-22 LAB — RESP PANEL BY RT-PCR (FLU A&B, COVID) ARPGX2
Influenza A by PCR: NEGATIVE
Influenza B by PCR: NEGATIVE
SARS Coronavirus 2 by RT PCR: NEGATIVE

## 2020-11-22 LAB — PROTIME-INR
INR: 1.1 (ref 0.8–1.2)
Prothrombin Time: 14 seconds (ref 11.4–15.2)

## 2020-11-22 LAB — POC OCCULT BLOOD, ED: Fecal Occult Bld: NEGATIVE

## 2020-11-22 LAB — TROPONIN I (HIGH SENSITIVITY)
Troponin I (High Sensitivity): 32 ng/L — ABNORMAL HIGH (ref <18)
Troponin I (High Sensitivity): 32 ng/L — ABNORMAL HIGH (ref <18)

## 2020-11-22 LAB — MAGNESIUM: Magnesium: 2.2 mg/dL (ref 1.7–2.4)

## 2020-11-22 LAB — BRAIN NATRIURETIC PEPTIDE: B Natriuretic Peptide: 203.7 pg/mL — ABNORMAL HIGH (ref 0.0–100.0)

## 2020-11-22 MED ORDER — ACETAMINOPHEN 325 MG PO TABS
650.0000 mg | ORAL_TABLET | Freq: Four times a day (QID) | ORAL | Status: DC | PRN
  Administered 2020-11-23: 650 mg via ORAL
  Filled 2020-11-22: qty 2

## 2020-11-22 MED ORDER — SENNOSIDES-DOCUSATE SODIUM 8.6-50 MG PO TABS
1.0000 | ORAL_TABLET | Freq: Every evening | ORAL | Status: DC | PRN
Start: 2020-11-22 — End: 2020-11-25

## 2020-11-22 MED ORDER — FUROSEMIDE 10 MG/ML IJ SOLN
40.0000 mg | Freq: Two times a day (BID) | INTRAMUSCULAR | Status: DC
  Administered 2020-11-23: 40 mg via INTRAVENOUS
  Filled 2020-11-22: qty 4

## 2020-11-22 MED ORDER — FUROSEMIDE 10 MG/ML IJ SOLN
60.0000 mg | Freq: Once | INTRAMUSCULAR | Status: AC
  Administered 2020-11-22: 60 mg via INTRAVENOUS
  Filled 2020-11-22: qty 8

## 2020-11-22 MED ORDER — METOPROLOL SUCCINATE ER 25 MG PO TB24
25.0000 mg | ORAL_TABLET | Freq: Every day | ORAL | Status: DC
  Administered 2020-11-23: 25 mg via ORAL
  Filled 2020-11-22: qty 1

## 2020-11-22 MED ORDER — INSULIN ASPART 100 UNIT/ML IJ SOLN
0.0000 [IU] | Freq: Three times a day (TID) | INTRAMUSCULAR | Status: DC
  Administered 2020-11-24: 1 [IU] via SUBCUTANEOUS
  Filled 2020-11-22: qty 0.06

## 2020-11-22 MED ORDER — ONDANSETRON HCL 4 MG/2ML IJ SOLN: 4.0000 mg | Freq: Four times a day (QID) | INTRAMUSCULAR | Status: DC | PRN

## 2020-11-22 MED ORDER — PANTOPRAZOLE SODIUM 40 MG PO TBEC
40.0000 mg | DELAYED_RELEASE_TABLET | Freq: Two times a day (BID) | ORAL | Status: DC
  Administered 2020-11-23 – 2020-11-24 (×4): 40 mg via ORAL
  Filled 2020-11-22 (×4): qty 1

## 2020-11-22 MED ORDER — ACETAMINOPHEN 650 MG RE SUPP: 650.0000 mg | Freq: Four times a day (QID) | RECTAL | Status: DC | PRN

## 2020-11-22 MED ORDER — ONDANSETRON HCL 4 MG PO TABS: 4.0000 mg | ORAL_TABLET | Freq: Four times a day (QID) | ORAL | Status: DC | PRN

## 2020-11-22 NOTE — ED Provider Notes (Signed)
[facility] DEPT Provider Note   CSN: [id number] Arrival date & time: 11/22/20  1601     History Chief Complaint  Patient presents with   bedsore   Dysuria   Weakness    Tonya [name] is a 80 y.o. female.  Presents to ER with multiple concerns.  Daughter provides most of history at bedside.  Patient was recently admitted couple weeks ago for GI bleed.  Daughter reports that patient has been having increased pain at bedsore site in her decubitus region.  No new redness or drainage from this area.  States that she normally has some swelling in her legs but the swelling has gotten significantly worse over the past couple weeks.  Also has intermittent difficulty breathing.  Patient denies any difficulty breathing at present.  No chest pain.  No abdominal pain.  No fevers.  HPI     Past Medical History:  Diagnosis Date   Cerebrovascular accident [facility]) 2001   left side weakness   Chronic edema    ? venous insufficiency   Colon cancer ([place]) 2020   COPD (chronic obstructive pulmonary disease) ([place])    never had a problem breathing   Diabetes mellitus without complication ([place])    Dyspnea    Enlarged heart    per pt   Gallstones    GERD (gastroesophageal reflux disease)    Goals of care, counseling/discussion 11/01/2018   History of radiation therapy 04/08/2020-05/21/2020   Colon;Dr. Gery Pray   Hyperlipidemia    Hypertension    PONV (postoperative nausea and vomiting)    nausea     Patient Active Problem List   Diagnosis Date Noted   Anasarca 11/22/2020   Pressure injury of skin 11/05/2020   Acute GI bleeding 11/04/2020   Acute deep vein thrombosis (DVT) of left lower extremity ([place]) 05/12/2020   NSVT (nonsustained ventricular tachycardia) ([place]) 05/12/2020   Diabetes ([place]) 12/16/2018   Goals of care, counseling/discussion 11/01/2018   Colon cancer metastasized to liver ([facility])    Symptomatic anemia 10/15/2018   D-dimer, elevated  10/11/2017   Leg edema, left 10/10/2017   Lesion of lip 10/10/2017   Vitamin D deficiency 07/17/2017   Spinal stenosis of lumbar region 01/23/2017   Frequent PVCs 11/23/2016   Heart murmur 09/19/2016   Iron deficiency anemia due to chronic blood loss 05/17/2016   Chronic right hip pain 06/08/2015   Abnormal EKG 02/23/2015   Bradycardia 10/20/2014   Routine general medical examination at a health care facility 01/02/2013   Hypertonicity of bladder 12/20/2009   Hyperlipidemia with target LDL less than 100 12/14/2008   GERD 11/03/2008   Right lumbar radiculitis 05/18/2008   Essential hypertension 07/26/2007   COPD 07/26/2007   CVA, old, hemiparesis ([place]) 07/26/2007    Past Surgical History:  Procedure Laterality Date   ABDOMINAL HYSTERECTOMY     BIOPSY  10/18/2018   Procedure: BIOPSY;  Surgeon: Jackquline Denmark, MD;  Location: [facility] ENDOSCOPY;  Service: Endoscopy;;   BIOPSY  11/05/2020   Procedure: BIOPSY;  Surgeon: Gatha Mayer, MD;  Location: [facility] ENDOSCOPY;  Service: Endoscopy;;   CHOLECYSTECTOMY     COLONOSCOPY WITH PROPOFOL N/A 10/18/2018   Procedure: COLONOSCOPY WITH PROPOFOL;  Surgeon: Jackquline Denmark, MD;  Location: [facility] ENDOSCOPY;  Service: Endoscopy;  Laterality: N/A;   ESOPHAGOGASTRODUODENOSCOPY (EGD) WITH PROPOFOL N/A 11/05/2020   Procedure: ESOPHAGOGASTRODUODENOSCOPY (EGD) WITH PROPOFOL;  Surgeon: Gatha Mayer, MD;  Location: [facility] ENDOSCOPY;  Service: Endoscopy;  Laterality: N/A;   IR  IMAGING GUIDED PORT INSERTION  11/11/2018   IR IVC FILTER PLMT / S&I /IMG GUID/MOD SED  02/11/2020   POLYPECTOMY  10/18/2018   Procedure: POLYPECTOMY;  Surgeon: Jackquline Denmark, MD;  Location: [facility] ENDOSCOPY;  Service: Endoscopy;;   SUBMUCOSAL TATTOO INJECTION  10/18/2018   Procedure: SUBMUCOSAL TATTOO INJECTION;  Surgeon: Jackquline Denmark, MD;  Location: [facility] ENDOSCOPY;  Service: Endoscopy;;     OB History   No obstetric history on file.     Family History  Problem Relation Age of Onset   Cirrhosis  Mother    Heart disease Father    Cancer Other    Hypertension Other    Diabetes Other     Social History   Tobacco Use   Smoking status: Former    Packs/day: 0.25    Types: Cigarettes    Quit date: 07/23/1968    Years since quitting: 52.3   Smokeless tobacco: Never   Tobacco comments:    6 MONTH  Vaping Use   Vaping Use: Never used  Substance Use Topics   Alcohol use: No    Alcohol/week: 0.0 standard drinks   Drug use: No    Home Medications Prior to Admission medications   Medication Sig Start Date End Date Taking? Authorizing Provider  acetaminophen (TYLENOL) 500 MG tablet Take 500 mg by mouth every 6 (six) hours as needed for moderate pain.    [provider]  Alcohol Swabs (B-D SINGLE USE SWABS REGULAR) PADS 1 each by Does not apply route as needed (to cleanse site prior to obtaining droplet of blood for CBG's). E11.9 07/08/19   Renato Shin, MD  B-D ULTRAFINE III SHORT PEN 31G X 8 MM MISC  09/01/19   [provider]  insulin degludec (TRESIBA FLEXTOUCH) 100 UNIT/ML FlexTouch Pen Inject 0.21 mLs (21 Units total) into the skin daily. Take 34 units on chemo days 09/19/19   Renato Shin, MD  ketoconazole (NIZORAL) 2 % cream Apply 1 application topically 2 (two) times daily. 02/09/20   McDonald, Stephan Minister, DPM  lidocaine-prilocaine (EMLA) cream Apply 1 application topically as needed for up to 30 doses. 09/01/20   [name], MD  meclizine (ANTIVERT) 12.5 MG tablet TAKE 1 TABLET BY MOUTH THREE TIMES DAILY AS NEEDED FOR DIZZINESS 09/27/20   [name], MD  metoprolol succinate (TOPROL-XL) 25 MG 24 hr tablet 25 mg daily. 01/02/20   [provider]  ondansetron (ZOFRAN) 8 MG tablet TAKE 1 TABLET(8 MG) BY MOUTH EVERY 8 HOURS AS NEEDED FOR NAUSEA OR VOMITING 10/15/20   [name], MD  pantoprazole (PROTONIX) 40 MG tablet Take 1 tablet (40 mg total) by mouth 2 (two) times daily. 11/08/20 12/08/20  Barb Merino, MD  prochlorperazine (COMPAZINE) 10 MG  tablet TAKE 1 TABLET(10 MG) BY MOUTH EVERY 6 HOURS AS NEEDED FOR NAUSEA OR VOMITING 08/23/20   [name], MD  torsemide (DEMADEX) 20 MG tablet Take 1 tablet (20 mg total) by mouth daily. 11/08/20 03/08/21  Barb Merino, MD  traMADol (ULTRAM) 50 MG tablet TAKE 1 TABLET(50 MG) BY MOUTH EVERY 6 HOURS AS NEEDED 08/31/20   [name], MD  TRUE METRIX BLOOD GLUCOSE TEST test strip TEST BLOOD SUGAR TWICE DAILY 01/16/20   Renato Shin, MD  TRUEplus Lancets 30G MISC 1 each by Does not apply route 2 (two) times daily. E11.9 07/08/19   Renato Shin, MD  Wound Dressings (MEPILEX BORDER FLEX LITE) PADS Apply 1 each topically daily. 11/08/20   Ghimire,  Dante Gang, MD    Allergies    Dilaudid [hydromorphone], Hydrocodone, Iohexol, Morphine and related, Naproxen, Other, Ibuprofen, and Propoxyphene n-acetaminophen  Review of Systems   Review of Systems  Constitutional:  Positive for fatigue. Negative for chills and fever.  HENT:  Negative for ear pain and sore throat.   Eyes:  Negative for pain and visual disturbance.  Respiratory:  Negative for cough and shortness of breath.   Cardiovascular:  Negative for chest pain and palpitations.  Gastrointestinal:  Positive for nausea. Negative for abdominal pain and vomiting.  Genitourinary:  Negative for dysuria and hematuria.  Musculoskeletal:  Positive for arthralgias and back pain.  Skin:  Negative for color change and rash.  Neurological:  Positive for weakness. Negative for seizures and syncope.  All other systems reviewed and are negative.  Physical Exam Updated Vital Signs BP (!) 141/89   Pulse 88   Temp 98.5 F (36.9 C) (Oral)   Resp (!) 24   Ht '5\' 11"'$  (1.803 m)   Wt 77.1 kg   SpO2 98%   BMI 23.71 kg/m   Physical Exam Vitals and nursing note reviewed.  Constitutional:      General: She is not in acute distress.    Appearance: She is well-developed.     Comments: Chronically ill-appearing  HENT:     Head: Normocephalic and atraumatic.   Eyes:     Conjunctiva/sclera: Conjunctivae normal.  Cardiovascular:     Rate and Rhythm: Normal rate and regular rhythm.     Heart sounds: Murmur heard.     Comments: Systolic ejection murmur Pulmonary:     Effort: Pulmonary effort is normal. No respiratory distress.     Breath sounds: Normal breath sounds.  Abdominal:     Palpations: Abdomen is soft.     Tenderness: There is no abdominal tenderness.     Comments: Pitting edema over abdomen, no tenderness  Genitourinary:    Comments: Small decubitus ulcer noted, skin involvement only, no surrounding erythema or induration Stool is somewhat dark, no hemorrhoids Musculoskeletal:     Cervical back: Neck supple.     Comments: Profound pitting edema in both legs extending to her mid abdomen  Skin:    General: Skin is warm and dry.  Neurological:     General: No focal deficit present.     Mental Status: She is alert. Mental status is at baseline.  Psychiatric:        Mood and Affect: Mood normal.    ED Results / Procedures / Treatments   Labs (all labs ordered are listed, but only abnormal results are displayed) Labs Reviewed  CBC WITH DIFFERENTIAL/PLATELET - Abnormal; Notable for the following components:      Result Value   WBC 12.4 (*)    RBC 3.34 (*)    Hemoglobin 9.5 (*)    HCT 31.6 (*)    RDW 16.6 (*)    Neutro Abs 10.3 (*)    Lymphs Abs 0.5 (*)    Monocytes Absolute 1.5 (*)    All other components within normal limits  COMPREHENSIVE METABOLIC PANEL - Abnormal; Notable for the following components:   Glucose, Bld 167 (*)    BUN 24 (*)    Total Protein 5.3 (*)    Albumin 2.2 (*)    AST 42 (*)    Alkaline Phosphatase 388 (*)    All other components within normal limits  URINALYSIS, ROUTINE W REFLEX MICROSCOPIC - Abnormal; Notable for the following components:  Color, Urine STRAW (*)    All other components within normal limits  BRAIN NATRIURETIC PEPTIDE - Abnormal; Notable for the following components:   B  Natriuretic Peptide 203.7 (*)    All other components within normal limits  TROPONIN I (HIGH SENSITIVITY) - Abnormal; Notable for the following components:   Troponin I (High Sensitivity) 32 (*)    All other components within normal limits  RESP PANEL BY RT-PCR (FLU A&B, COVID) ARPGX2  PROTIME-INR  MAGNESIUM  POC OCCULT BLOOD, ED  TYPE AND SCREEN  TROPONIN I (HIGH SENSITIVITY)    EKG EKG Interpretation  Date/Time:  Monday November 22 2020 17:51:04 EDT Ventricular Rate:  96 PR Interval:  161 QRS Duration: 85 QT Interval:  382 QTC Calculation: 483 R Axis:   -46 Text Interpretation: Sinus rhythm Inferior infarct, old Probable anteroseptal infarct, recent since last tracing no significant change Confirmed by Daleen Bo [phone]) on 11/22/2020 6:13:11 PM  Radiology DG Chest 2 View  Result Date: 11/22/2020 CLINICAL DATA:  Dysuria, weakness, bedsore EXAM: CHEST - 2 VIEW COMPARISON:  11/04/2020 FINDINGS: Frontal and lateral views of the chest demonstrates stable right chest wall port. Cardiac silhouette is stable. There is increasing bibasilar consolidation and enlarging bilateral pleural effusions. No pneumothorax. No acute bony abnormalities. IMPRESSION: 1. Enlarging bilateral pleural effusions with increasing bibasilar consolidation, likely atelectasis. Electronically Signed   By: Randa Ngo M.D.   On: 11/22/2020 18:03    Procedures Procedures   Medications Ordered in ED Medications  furosemide (LASIX) injection 60 mg (60 mg Intravenous Given 11/22/20 2141)    ED Course  I have reviewed the triage vital signs and the nursing notes.  Pertinent labs & imaging results that were available during my care of the patient were reviewed by me and considered in my medical decision making (see chart for details).    MDM Rules/Calculators/A&P                           27-year-old lady presents to ER with concern for generalized weakness and pain around bedsore.  On exam patient appears  chronically ill-appearing but was not in acute distress.  Vitals stable.  Noted to have profound pitting edema from legs extending into her abdomen.  Hemoccult negative.  Her basic lab work was actually grossly reassuring.  Hemoglobin improved from last admission for GI bleed.  Kidney function within normal limits.  BNP is elevated and chest x-ray did have some worsening pleural effusions.  Concern for significant fluid overload state based on exam.  Based on my review of chart, past providers have commented on some degree of lower extremity edema but believe the severity of what is seen today is new.  Believe she would benefit from admission for further observation, aggressive diuresis, likely echocardiogram.     After the discussed management above, the patient was determined to be safe for discharge.  The patient was in agreement with this plan and all questions regarding their care were answered.  ED return precautions were discussed and the patient will return to the ED with any significant worsening of condition.  Final Clinical Impression(s) / ED Diagnoses Final diagnoses:  Hypervolemia, unspecified hypervolemia type  Heart failure, unspecified HF chronicity, unspecified heart failure type ([place])  Pleural effusion    Rx / DC Orders ED Discharge Orders     None        Lucrezia Starch, MD 11/22/20 2223

## 2020-11-22 NOTE — H&P (Signed)
History and Physical    Tonya [name] [id number] DOB: 08/25/1940 DOA: 11/22/2020  PCP: Leeroy Cha, MD   Patient coming from: Home   Chief Complaint: Increased fatigue, swelling, worsening pressure ulcer, pallor, dysuria  HPI: Tonya [name] is a 80 y.o. female with medical history significant for stage IV adenocarcinoma of the colon, 3 of recurrent DVT with IVC filter no longer on Eliquis after life-threatening GI bleeding, hypertension, type 2 diabetes mellitus, and pressure ulcer, now presenting to the emergency department with several complaints including increased fatigue, swelling, worsening pressure ulcer, pallor, and dysuria.  Patient is accompanied by her daughter who assists with history.  Patient was admitted to the hospital 2 weeks ago with GI bleed, was hypotensive and fluid resuscitated, transfused with 2 units of RBC, had EGD with antral erosions that were nonbleeding and condition was felt to be related to her colon cancer.  She has been unable to tolerate any further treatment for colon cancer due to her poor performance status.  Hospice has been recommended by multiple physicians but family believes the patient would want to continue receiving treatment for any treatable conditions.  Patient denies any chest pain or abdominal pain, acknowledges increased fatigue, does not feel short of breath.  Patient complains of some pain at lower back and upper buttock that her family attributes to a worsening pressure sore.  ED Course: Upon arrival to the ED, patient is found to be afebrile, saturating well on room air, slightly tachypneic, and with stable blood pressure.  EKG features sinus rhythm.  Chemistry panel with elevated alkaline phosphatase, albumin 2.2, and total protein 5.3.  CBC with chronic leukocytosis and hemoglobin 9.5.  High-sensitivity troponin is slightly elevated and flat.  BNP is 204.  Fecal occult blood testing negative.  Chest x-ray with enlarging bilateral  pleural effusions.  Patient was given 60 mg IV Lasix in the ED.  Review of Systems:  All other systems reviewed and apart from HPI, are negative.  Past Medical History:  Diagnosis Date   Cerebrovascular accident [facility]) 2001   left side weakness   Chronic edema    ? venous insufficiency   Colon cancer ([place]) 2020   COPD (chronic obstructive pulmonary disease) ([place])    never had a problem breathing   Diabetes mellitus without complication ([place])    Dyspnea    Enlarged heart    per pt   Gallstones    GERD (gastroesophageal reflux disease)    Goals of care, counseling/discussion 11/01/2018   History of radiation therapy 04/08/2020-05/21/2020   Colon;Dr. Gery Pray   Hyperlipidemia    Hypertension    PONV (postoperative nausea and vomiting)    nausea     Past Surgical History:  Procedure Laterality Date   ABDOMINAL HYSTERECTOMY     BIOPSY  10/18/2018   Procedure: BIOPSY;  Surgeon: Jackquline Denmark, MD;  Location: [facility] ENDOSCOPY;  Service: Endoscopy;;   BIOPSY  11/05/2020   Procedure: BIOPSY;  Surgeon: Gatha Mayer, MD;  Location: [facility] ENDOSCOPY;  Service: Endoscopy;;   CHOLECYSTECTOMY     COLONOSCOPY WITH PROPOFOL N/A 10/18/2018   Procedure: COLONOSCOPY WITH PROPOFOL;  Surgeon: Jackquline Denmark, MD;  Location: [facility];  Service: Endoscopy;  Laterality: N/A;   ESOPHAGOGASTRODUODENOSCOPY (EGD) WITH PROPOFOL N/A 11/05/2020   Procedure: ESOPHAGOGASTRODUODENOSCOPY (EGD) WITH PROPOFOL;  Surgeon: Gatha Mayer, MD;  Location: [facility] ENDOSCOPY;  Service: Endoscopy;  Laterality: N/A;   IR IMAGING GUIDED PORT INSERTION  11/11/2018   IR IVC FILTER PLMT /  S&I Burke Keels GUID/MOD SED  02/11/2020   POLYPECTOMY  10/18/2018   Procedure: POLYPECTOMY;  Surgeon: Jackquline Denmark, MD;  Location: [facility] ENDOSCOPY;  Service: Endoscopy;;   SUBMUCOSAL TATTOO INJECTION  10/18/2018   Procedure: SUBMUCOSAL TATTOO INJECTION;  Surgeon: Jackquline Denmark, MD;  Location: [facility] ENDOSCOPY;  Service: Endoscopy;;    Social History:    reports that she quit smoking about 52 years ago. Her smoking use included cigarettes. She smoked an average of .25 packs per day. She has never used smokeless tobacco. She reports that she does not drink alcohol and does not use drugs.  Allergies  Allergen Reactions   Dilaudid [Hydromorphone]     unconscious   Hydrocodone Swelling and Other (See Comments)    "I started swelling, became red, and passed out"   Iohexol Hives, Shortness Of Breath and Other (See Comments)    Patient developed hives and fullness in throat post injection of 125cc's Omni 300, Onset Date: 11/15/2006    Morphine And Related Other (See Comments)    Unconscious, can't talk for 2 days   Naproxen Other (See Comments)    "It made me feel out of my head"   Other Other (See Comments)   Ibuprofen Nausea Only   Propoxyphene N-Acetaminophen Other (See Comments)    "I couldn't find the door to make my way out of the room- I was in misery"    Family History  Problem Relation Age of Onset   Cirrhosis Mother    Heart disease Father    Cancer Other    Hypertension Other    Diabetes Other      Prior to Admission medications   Medication Sig Start Date End Date Taking? Authorizing Provider  acetaminophen (TYLENOL) 500 MG tablet Take 500 mg by mouth every 6 (six) hours as needed for moderate pain.    [provider]  Alcohol Swabs (B-D SINGLE USE SWABS REGULAR) PADS 1 each by Does not apply route as needed (to cleanse site prior to obtaining droplet of blood for CBG's). E11.9 07/08/19   Renato Shin, MD  B-D ULTRAFINE III SHORT PEN 31G X 8 MM MISC  09/01/19   [provider]  insulin degludec (TRESIBA FLEXTOUCH) 100 UNIT/ML FlexTouch Pen Inject 0.21 mLs (21 Units total) into the skin daily. Take 34 units on chemo days 09/19/19   Renato Shin, MD  ketoconazole (NIZORAL) 2 % cream Apply 1 application topically 2 (two) times daily. 02/09/20   McDonald, Stephan Minister, DPM  lidocaine-prilocaine (EMLA) cream Apply 1  application topically as needed for up to 30 doses. 09/01/20   [name], MD  meclizine (ANTIVERT) 12.5 MG tablet TAKE 1 TABLET BY MOUTH THREE TIMES DAILY AS NEEDED FOR DIZZINESS 09/27/20   [name], MD  metoprolol succinate (TOPROL-XL) 25 MG 24 hr tablet 25 mg daily. 01/02/20   [provider]  ondansetron (ZOFRAN) 8 MG tablet TAKE 1 TABLET(8 MG) BY MOUTH EVERY 8 HOURS AS NEEDED FOR NAUSEA OR VOMITING 10/15/20   [name], MD  pantoprazole (PROTONIX) 40 MG tablet Take 1 tablet (40 mg total) by mouth 2 (two) times daily. 11/08/20 12/08/20  Barb Merino, MD  prochlorperazine (COMPAZINE) 10 MG tablet TAKE 1 TABLET(10 MG) BY MOUTH EVERY 6 HOURS AS NEEDED FOR NAUSEA OR VOMITING 08/23/20   [name], MD  torsemide (DEMADEX) 20 MG tablet Take 1 tablet (20 mg total) by mouth daily. 11/08/20 03/08/21  Barb Merino, MD  traMADol (ULTRAM) 50 MG tablet TAKE  1 TABLET(50 MG) BY MOUTH EVERY 6 HOURS AS NEEDED 08/31/20   [name], MD  TRUE METRIX BLOOD GLUCOSE TEST test strip TEST BLOOD SUGAR TWICE DAILY 01/16/20   Renato Shin, MD  TRUEplus Lancets 30G MISC 1 each by Does not apply route 2 (two) times daily. E11.9 07/08/19   Renato Shin, MD  Wound Dressings (MEPILEX BORDER FLEX LITE) PADS Apply 1 each topically daily. 11/08/20   Barb Merino, MD    Physical Exam: Vitals:   11/22/20 1907 11/22/20 1927 11/22/20 2030 11/22/20 2130  BP: (!) 127/92 140/87 (!) 148/86 (!) 141/89  Pulse: 88 92 87 88  Resp: '19 18 19 '$ (!) 24  Temp:      TempSrc:      SpO2: 98% 98% 100% 98%  Weight:      Height:        Constitutional: NAD, sleeping  Eyes: PERTLA, lids and conjunctivae normal ENMT: Mucous membranes are moist. Posterior pharynx clear of any exudate or lesions.   Neck: supple, no masses  Respiratory: Diminished bilaterally, no wheezing, no crackles. No cyanosis.  Cardiovascular: S1 & S2 heard, regular rate and rhythm. Bilateral LE pitting edema extending into abdomen.   Abdomen: No distension, no tenderness, soft. Bowel sounds active.  Musculoskeletal: no clubbing / cyanosis. No joint deformity upper and lower extremities.   Skin: no significant rashes, lesions, ulcers. Warm, dry, well-perfused. Neurologic: No gross facial asymmetry. Sensation intact. Appears to be sleeping, wakes to voice and answers questions without opening eyes.  Psychiatric:  Pleasant. Cooperative.    Labs and Imaging on Admission: I have personally reviewed following labs and imaging studies  CBC: Recent Labs  Lab 11/22/20 1837  WBC 12.4*  NEUTROABS 10.3*  HGB 9.5*  HCT 31.6*  MCV 94.6  PLT [id number]   Basic Metabolic Panel: Recent Labs  Lab 11/22/20 1837  NA 142  K 3.7  CL 105  CO2 29  GLUCOSE 167*  BUN 24*  CREATININE 0.92  CALCIUM 9.2  MG 2.2   GFR: Estimated Creatinine Clearance: 52.7 mL/min (by C-G formula based on SCr of 0.92 mg/dL). Liver Function Tests: Recent Labs  Lab 11/22/20 1837  AST 42*  ALT 18  ALKPHOS 388*  BILITOT 0.9  PROT 5.3*  ALBUMIN 2.2*   No results for input(s): LIPASE, AMYLASE in the last 168 hours. No results for input(s): AMMONIA in the last 168 hours. Coagulation Profile: Recent Labs  Lab 11/22/20 1837  INR 1.1   Cardiac Enzymes: No results for input(s): CKTOTAL, CKMB, CKMBINDEX, TROPONINI in the last 168 hours. BNP (last 3 results) No results for input(s): PROBNP in the last 8760 hours. HbA1C: No results for input(s): HGBA1C in the last 72 hours. CBG: No results for input(s): GLUCAP in the last 168 hours. Lipid Profile: No results for input(s): CHOL, HDL, LDLCALC, TRIG, CHOLHDL, LDLDIRECT in the last 72 hours. Thyroid Function Tests: No results for input(s): TSH, T4TOTAL, FREET4, T3FREE, THYROIDAB in the last 72 hours. Anemia Panel: No results for input(s): VITAMINB12, FOLATE, FERRITIN, TIBC, IRON, RETICCTPCT in the last 72 hours. Urine analysis:    Component Value Date/Time   COLORURINE STRAW (A) 11/22/2020 1808    APPEARANCEUR CLEAR 11/22/2020 1808   LABSPEC 1.006 11/22/2020 1808   PHURINE 8.0 11/22/2020 1808   GLUCOSEU NEGATIVE 11/22/2020 1808   GLUCOSEU >=1000 (A) 10/10/2017 1216   HGBUR NEGATIVE 11/22/2020 1808   BILIRUBINUR NEGATIVE 11/22/2020 1808   BILIRUBINUR negative (A) 05/31/2018 [place] 11/22/2020 1808  PROTEINUR NEGATIVE 11/22/2020 1808   UROBILINOGEN 1.0 05/31/2018 1712   UROBILINOGEN 1.0 10/10/2017 1216   NITRITE NEGATIVE 11/22/2020 1808   LEUKOCYTESUR NEGATIVE 11/22/2020 1808   Sepsis Labs: '@LABRCNTIP'$ (procalcitonin:4,lacticidven:4) ) Recent Results (from the past 240 hour(s))  Resp Panel by RT-PCR (Flu A&B, Covid) Nasopharyngeal Swab     Status: None   Collection Time: 11/22/20  9:38 PM   Specimen: Nasopharyngeal Swab; Nasopharyngeal(NP) swabs in vial transport medium  Result Value Ref Range Status   SARS Coronavirus 2 by RT PCR NEGATIVE NEGATIVE Final    Comment: (NOTE) SARS-CoV-2 target nucleic acids are NOT DETECTED.  The SARS-CoV-2 RNA is generally detectable in upper respiratory specimens during the acute phase of infection. The lowest concentration of SARS-CoV-2 viral copies this assay can detect is 138 copies/mL. A negative result does not preclude SARS-Cov-2 infection and should not be used as the sole basis for treatment or other patient management decisions. A negative result may occur with  improper specimen collection/handling, submission of specimen other than nasopharyngeal swab, presence of viral mutation(s) within the areas targeted by this assay, and inadequate number of viral copies(<138 copies/mL). A negative result must be combined with clinical observations, patient history, and epidemiological information. The expected result is Negative.  Fact Sheet for Patients:  EntrepreneurPulse.com.au  Fact Sheet for Healthcare Providers:  IncredibleEmployment.be  This test is no t yet approved or  cleared by the [place] FDA and  has been authorized for detection and/or diagnosis of SARS-CoV-2 by FDA under an Emergency Use Authorization (EUA). This EUA will remain  in effect (meaning this test can be used) for the duration of the COVID-19 declaration under Section 564(b)(1) of the Act, 21 U.S.C.section 360bbb-3(b)(1), unless the authorization is terminated  or revoked sooner.       Influenza A by PCR NEGATIVE NEGATIVE Final   Influenza B by PCR NEGATIVE NEGATIVE Final    Comment: (NOTE) The Xpert Xpress SARS-CoV-2/FLU/RSV plus assay is intended as an aid in the diagnosis of influenza from Nasopharyngeal swab specimens and should not be used as a sole basis for treatment. Nasal washings and aspirates are unacceptable for Xpert Xpress SARS-CoV-2/FLU/RSV testing.  Fact Sheet for Patients: EntrepreneurPulse.com.au  Fact Sheet for Healthcare Providers: IncredibleEmployment.be  This test is not yet approved or cleared by the [place] FDA and has been authorized for detection and/or diagnosis of SARS-CoV-2 by FDA under an Emergency Use Authorization (EUA). This EUA will remain in effect (meaning this test can be used) for the duration of the COVID-19 declaration under Section 564(b)(1) of the Act, 21 U.S.C. section 360bbb-3(b)(1), unless the authorization is terminated or revoked.  Performed at [facility], [place] [address] [place], [place] [address]      Radiological Exams on Admission: DG Chest 2 View  Result Date: 11/22/2020 CLINICAL DATA:  Dysuria, weakness, bedsore EXAM: CHEST - 2 VIEW COMPARISON:  11/04/2020 FINDINGS: Frontal and lateral views of the chest demonstrates stable right chest wall port. Cardiac silhouette is stable. There is increasing bibasilar consolidation and enlarging bilateral pleural effusions. No pneumothorax. No acute bony abnormalities. IMPRESSION: 1. Enlarging bilateral pleural  effusions with increasing bibasilar consolidation, likely atelectasis. Electronically Signed   By: Randa Ngo M.D.   On: 11/22/2020 18:03    EKG: Independently reviewed. Sinus rhythm.   Assessment/Plan   1. Anasarca; acute on chronic diastolic CHF  - Patient presents with anasarca, found to have elevated BNP, albumin 2.2, bilateral pleural effusions  - EF was normal in  2018  - She was given IVF and RBC transfusions during recent admission when she had acute GIB and hypotension  - She was given IV Lasix in ED and is diuresing  - Continue diuresis with Lasix 40 mg IV q12h, monitor renal function and electrolytes, monitor weight and I/Os    2. Pleural effusions  - Enlarging b/l pleural effusions noted on CXR in ED  - Likely related to hypervolemia, plan to diurese as above    3. Stage IV adenocarcinoma of colon - Unable to tolerate any further therapy for this, hospice recommended by oncology but family wants continued treatment   4. Anemia  - Recently admitted with GIB, had antral erosions without bleeding on EGD, known colon cancer, and was discharged on BID Protonix  - Appears stable, FOBT negative this admission, continue PPI   5. Hypertension  - Continue Toprol as tolerated    6. Type II DM  - A1c was only 4.5% last month  - Monitor and treat as-needed    7. Pressure ulcer  - Family reports worsening in chronic sacral ulcer, does not appear acutely infected  - Wound care    8. History of DVT  - Eliquis discontinued after recent life-threatening GI bleed  - She has IVC filter  - Current leg swelling symmetric and is setting of anasarca, no phlegmasia   9. Goals of care  - GOC discussed with patient and her daughter, [name]  - Patient bedbound, sleeps most of the day, has metastatic colon cancer with inability to tolerate any further treatment, recurrent DVTs but also recurrent life-threatening GIB, and hospice has been recommended by multiple physicians  - [name]  feels her mother still has some quality of life and wants her to continue to receive treatment for any treatable condition but confirms DNR/DNI    DVT prophylaxis: SCDs  Code Status: DNR  Level of Care: Level of care: Telemetry Family Communication: Daughter Jordian Coate) updated at bedside  Disposition Plan:  Patient is from: Home  Anticipated d/c is to: TBD Anticipated d/c date is: 11/25/20 Patient currently: Pending improvement in volume status  Consults called: None   Admission status: Inpatient     Vianne Bulls, MD [facility]  11/22/2020, 10:42 PM

## 2020-11-22 NOTE — Progress Notes (Signed)
[place] PALLIATIVE CARE RN NOTE  PATIENT NAME: Tonya [name] DOB: 22-Apr-1941 MRN: [id number]  PRIMARY CARE PROVIDER: Leeroy Cha, MD  RESPONSIBLE PARTY: Odette Horns (daughter) Acct ID - Guarantor Home Phone Work Phone Relationship Acct Type  [id number] MYALYNN, [phone]  Self P/F     [place], [place], [place] [address]   Covid-19 Pre-screening Negative  PLAN OF CARE and INTERVENTION:  ADVANCE CARE PLANNING/GOALS OF CARE: Goal is for patient to remain in her home. Daughter still wants patient to have blood transfusions if indicated PATIENT/CAREGIVER EDUCATION: Symptom management, pain management, s/s of infection DISEASE STATUS: Joint follow up visit completed with palliative care MSW, [facility]. Met with patient and daughter in patient's home. Patient was recently hospitalized for an acute GI bleed 11/04/20 to 11/08/20. GI bleed felt to most likely be from patient's Stage IV colon cancer. Upon arrival to the ER her Hgb was 7.6. She received 2 units of packed red blood cells. Hgb remained between 8.5-9. She was released back to her home. Daughter has a strong faith and feels that patient will be healed. When discussing the colon cancer, daughter replied "We don't say that my mother has cancer, we just say her current condition is a result of when she had cancer. She no longer has cancer." She still wants her mother to have blood transfusions and IV fluids if needed despite lengthy education. She is not agreeable to hospice. Patient sleeps most of the day, but is aroused with verbal and tactile stimulation. She was able to answer questions. She does experience severe abdominal pain at times. She is mainly giving Tylenol and will only give Tramadol if patient is in severe pain. She feels this makes patient more lethargic and confused. She has been having some visual hallucinations. Daughter also feels patient may have a UTI due to dysuria and strong odor.  Obtained specimen via clean catch and took to her PCPs office for testing. Her appetite is poor. She eats 3 meals per day but mainly a few bites and sips. She is taking larger pills crushed in applesauce. No nausea or vomiting. No shortness of breath or adventitious breath sounds noted even though daughter reports patient wheezing when she sits up. She has generalized pitting edema. She is incontinent of both bowel and bladder. Her stools have been "rust colored" in appearance. She has a wound to her sacrum that she dresses daily. She also talks about needing more help. Educated on the weekly visits and support hospice can provide. Daughter still not interested in transitioning to hospice at this time.  HISTORY OF PRESENT ILLNESS: This is a 80 yo female with a diagnosis of stage IV metastatic colon cancer. Palliative care will continue to follow patient and assist with goals of care and complex decision making.   CODE STATUS: DNR ADVANCED DIRECTIVES: N MOST FORM: no PPS: 30%   PHYSICAL EXAM:   VITALS: Today's Vitals   11/16/20 1239  BP: 115/81  Pulse: 88  Resp: 18  Temp: 97.9 F (36.6 C)  TempSrc: Temporal  SpO2: 97%  PainSc: 0-No pain    LUNGS: clear to auscultation  CARDIAC: Cor irreg EXTREMITIES: Generalized edema SKIN:  Daughter reports patient has a sacral wound, exposed skin dry and intact   NEURO:  Alert and oriented to person/place, intermittent confusion, increased generalized weakness, total care/bedbound   (Duration of visit and documentation 60 minutes)   Daryl Eastern, RN BSN

## 2020-11-22 NOTE — ED Provider Notes (Signed)
Emergency Medicine Provider Triage Evaluation Note  Tonya [name] , a 80 y.o. female  was evaluated in triage.  Pt here with daughter. Daughter states pt has a uti, looks pale and has a bed sore  Review of Systems  Positive: Bed sore, appears pale Negative: fever  Physical Exam  BP 109/76 (BP Location: Right Arm)   Pulse 99   Temp 98.5 F (36.9 C) (Oral)   Resp 16   Ht '5\' 11"'$  (1.803 m)   Wt 77.1 kg   SpO2 97%   BMI 23.71 kg/m  Gen:   Awake, no distress   Resp:  Normal effort  Other:  Bue/ble 2-3+ pitting edema, pale conjunctiva  Medical Decision Making  Medically screening exam initiated at 5:03 PM.  Appropriate orders placed.  Peggye Fothergill was informed that the remainder of the evaluation will be completed by another provider, this initial triage assessment does not replace that evaluation, and the importance of remaining in the ED until their evaluation is complete.     Rodney Booze, PA-C 11/22/20 1709    Daleen Bo, MD 11/22/20 [phone]

## 2020-11-22 NOTE — ED Notes (Signed)
Dr. Roslynn Amble, MD informed of Negative POC occult blood test. Test performed relatively at 1850 in ED mini lab by this tech and resulted negative. Reason is unknown why test did not crossover.

## 2020-11-22 NOTE — ED Triage Notes (Signed)
Patient's daughter reports thta a home health nurse came Friday to see the patient and collected a urine sample for c/o dysuria. Patient's daughter reports weakness and a bedsore on the coccyx area that looks worse.

## 2020-11-23 ENCOUNTER — Inpatient Hospital Stay (HOSPITAL_COMMUNITY): Payer: Medicare HMO

## 2020-11-23 DIAGNOSIS — I69359 Hemiplegia and hemiparesis following cerebral infarction affecting unspecified side: Secondary | ICD-10-CM | POA: Diagnosis not present

## 2020-11-23 DIAGNOSIS — I5033 Acute on chronic diastolic (congestive) heart failure: Secondary | ICD-10-CM

## 2020-11-23 DIAGNOSIS — R601 Generalized edema: Secondary | ICD-10-CM | POA: Diagnosis not present

## 2020-11-23 DIAGNOSIS — C189 Malignant neoplasm of colon, unspecified: Secondary | ICD-10-CM | POA: Diagnosis not present

## 2020-11-23 DIAGNOSIS — D649 Anemia, unspecified: Secondary | ICD-10-CM | POA: Diagnosis not present

## 2020-11-23 DIAGNOSIS — I421 Obstructive hypertrophic cardiomyopathy: Secondary | ICD-10-CM

## 2020-11-23 LAB — URINALYSIS, ROUTINE W REFLEX MICROSCOPIC
Bilirubin Urine: NEGATIVE
Glucose, UA: NEGATIVE mg/dL
Hgb urine dipstick: NEGATIVE
Ketones, ur: NEGATIVE mg/dL
Leukocytes,Ua: NEGATIVE
Nitrite: NEGATIVE
Protein, ur: NEGATIVE mg/dL
Specific Gravity, Urine: 1.01 (ref 1.005–1.030)
pH: 7 (ref 5.0–8.0)

## 2020-11-23 LAB — BASIC METABOLIC PANEL
Anion gap: 12 (ref 5–15)
BUN: 23 mg/dL (ref 8–23)
CO2: 28 mmol/L (ref 22–32)
Calcium: 8.9 mg/dL (ref 8.9–10.3)
Chloride: 103 mmol/L (ref 98–111)
Creatinine, Ser: 0.91 mg/dL (ref 0.44–1.00)
GFR, Estimated: >60 mL/min (ref >60)
Glucose, Bld: 153 mg/dL — ABNORMAL HIGH (ref 70–99)
Potassium: 3.5 mmol/L (ref 3.5–5.1)
Sodium: 143 mmol/L (ref 135–145)

## 2020-11-23 LAB — CBG MONITORING, ED: Glucose-Capillary: 139 mg/dL — ABNORMAL HIGH (ref 70–99)

## 2020-11-23 LAB — CBC
HCT: 29 % — ABNORMAL LOW (ref 36.0–46.0)
Hemoglobin: 8.9 g/dL — ABNORMAL LOW (ref 12.0–15.0)
MCH: 28.6 pg (ref 26.0–34.0)
MCHC: 30.7 g/dL (ref 30.0–36.0)
MCV: 93.2 fL (ref 80.0–100.0)
Platelets: 230 10*3/uL (ref 150–400)
RBC: 3.11 MIL/uL — ABNORMAL LOW (ref 3.87–5.11)
RDW: 16.6 % — ABNORMAL HIGH (ref 11.5–15.5)
WBC: 11 10*3/uL — ABNORMAL HIGH (ref 4.0–10.5)
nRBC: 0 % (ref 0.0–0.2)

## 2020-11-23 LAB — GLUCOSE, CAPILLARY
Glucose-Capillary: 146 mg/dL — ABNORMAL HIGH (ref 70–99)
Glucose-Capillary: 149 mg/dL — ABNORMAL HIGH (ref 70–99)
Glucose-Capillary: 171 mg/dL — ABNORMAL HIGH (ref 70–99)

## 2020-11-23 LAB — HEPATIC FUNCTION PANEL
ALT: 17 U/L (ref 0–44)
AST: 38 U/L (ref 15–41)
Albumin: 2.1 g/dL — ABNORMAL LOW (ref 3.5–5.0)
Alkaline Phosphatase: 321 U/L — ABNORMAL HIGH (ref 38–126)
Bilirubin, Direct: 0.4 mg/dL — ABNORMAL HIGH (ref 0.0–0.2)
Indirect Bilirubin: 0.6 mg/dL (ref 0.3–0.9)
Total Bilirubin: 1 mg/dL (ref 0.3–1.2)
Total Protein: 5 g/dL — ABNORMAL LOW (ref 6.5–8.1)

## 2020-11-23 LAB — ECHOCARDIOGRAM COMPLETE
AV Peak grad: 113.2 mmHg
Ao pk vel: 5.32 m/s
Area-P 1/2: 8.25 cm2
Height: 71 in
MV M vel: 6.84 m/s
MV Peak grad: 187.2 mmHg
Radius: 0.5 cm
S' Lateral: 1.8 cm
Single Plane A4C EF: 85.1 %
Weight: 3273.6 oz

## 2020-11-23 LAB — TROPONIN I (HIGH SENSITIVITY)
Troponin I (High Sensitivity): 31 ng/L — ABNORMAL HIGH (ref <18)
Troponin I (High Sensitivity): 35 ng/L — ABNORMAL HIGH (ref <18)

## 2020-11-23 LAB — MAGNESIUM: Magnesium: 2 mg/dL (ref 1.7–2.4)

## 2020-11-23 MED ORDER — METOPROLOL SUCCINATE ER 25 MG PO TB24
25.0000 mg | ORAL_TABLET | Freq: Once | ORAL | Status: AC
  Administered 2020-11-23: 25 mg via ORAL
  Filled 2020-11-23: qty 1

## 2020-11-23 MED ORDER — METOPROLOL SUCCINATE ER 50 MG PO TB24
50.0000 mg | ORAL_TABLET | Freq: Every day | ORAL | Status: DC
  Administered 2020-11-24: 50 mg via ORAL
  Filled 2020-11-23: qty 1

## 2020-11-23 MED ORDER — POLYETHYLENE GLYCOL 3350 17 G PO PACK: 17.0000 g | PACK | Freq: Every day | ORAL | Status: DC | PRN

## 2020-11-23 MED ORDER — PERFLUTREN LIPID MICROSPHERE
1.0000 mL | INTRAVENOUS | Status: AC | PRN
  Administered 2020-11-23: 1 mL via INTRAVENOUS
  Filled 2020-11-23: qty 10

## 2020-11-23 MED ORDER — SODIUM CHLORIDE 0.9% FLUSH
10.0000 mL | Freq: Two times a day (BID) | INTRAVENOUS | Status: DC
  Administered 2020-11-24: 10 mL

## 2020-11-23 MED ORDER — POLYETHYLENE GLYCOL 3350 17 G PO PACK
17.0000 g | PACK | Freq: Every day | ORAL | Status: DC
  Administered 2020-11-23 – 2020-11-24 (×2): 17 g via ORAL
  Filled 2020-11-23 (×2): qty 1

## 2020-11-23 MED ORDER — SODIUM CHLORIDE 0.9% FLUSH: 10.0000 mL | INTRAVENOUS | Status: DC | PRN

## 2020-11-23 MED ORDER — CHLORHEXIDINE GLUCONATE CLOTH 2 % EX PADS
6.0000 | MEDICATED_PAD | Freq: Every day | CUTANEOUS | Status: DC
  Administered 2020-11-23 – 2020-11-24 (×2): 6 via TOPICAL

## 2020-11-23 NOTE — Progress Notes (Signed)
*  PRELIMINARY RESULTS* Echocardiogram 2D Echocardiogram with Definity has been performed.  Luisa Hart RDCS 11/23/2020, 12:28 PM

## 2020-11-23 NOTE — Consult Note (Signed)
WOC Nurse Consult Note: Patient receiving care in [place] [facility]. Female family member and primary RN present at time of my assessment. Reason for Consult: stage 2 PI Wound type: stage 2 PI to coccyx area  Pressure Injury POA: Yes Measurement: see flowsheet documentation Wound bed: pink with a very thin light yellow tinged biofilm over a segment of the wound Drainage (amount, consistency, odor) none Periwound: intact Dressing procedure/placement/frequency:  A sacral foam dressing is in use, and this approach should be continued while inpatient. Monitor the wound area(s) for worsening of condition such as: Signs/symptoms of infection,  Increase in size,  Development of or worsening of odor, Development of pain, or increased pain at the affected locations.  Notify the medical team if any of these develop.  Thank you for the consult.  Discussed plan of care with the patient's family member and bedside nurse.  [place] nurse will not follow at this time.  Please re-consult the [place] team if needed.  Val Riles, RN, MSN, CWOCN, CNS-BC, pager [phone]

## 2020-11-23 NOTE — Evaluation (Addendum)
Physical Therapy Evaluation-1x Patient Details Name: Tonya [name] MRN: [id number] DOB: 1941-03-25 Today's Date: 11/23/2020   History of Present Illness  80 yo female admitted with anasarca, CHF, pleural effusions, increased edema. Hx of sacral wound, stage IV colon ca, COPD, DVT, IVC filter, DM, chronic edema, anemia, CVA-L hemiparesis  Clinical Impression  Bed level eval only. Pt is lethargic. She did agree to allow me to assess UE and LEs. Family present during session. Plan is for pt to return home with family. Pt is bedbound at baseline.They are requesting HHPT f/u for ROM/stretching. HHPT could provide education on ROM and repositioning in home environment. Discussed DME-pt could benefit from an air mattress for her hospital bed since she is unable to tolerate repositioning in bed. Family is requesting a hoyer lift and a trapeze. 1x eval. Will defer further evaluation and assessment to HHPT.     Follow Up Recommendations Home health PT (family is requesting HHPT f/u.)    Equipment Recommendations   air mattress for hospital bed; family is requesting trapeze and hoyer lift   Recommendations for Other Services       Precautions / Restrictions Precautions Precautions: Fall Restrictions Weight Bearing Restrictions: No      Mobility  Bed Mobility               General bed mobility comments: NT-pt is bedbound at baseline    Transfers                    Ambulation/Gait                Stairs            Wheelchair Mobility    Modified Rankin (Stroke Patients Only)       Balance                                             Pertinent Vitals/Pain Pain Assessment: Faces Faces Pain Scale: Hurts little more Pain Location: L UE with movement Pain Intervention(s): Limited activity within patient's tolerance;Monitored during session    [place] expects to be discharged to:: Private residence Living  Arrangements: Children Available Help at Discharge: Family Type of Home: [place]: Environmental consultant - 2 wheels;Wheelchair - manual;Hospital bed      Prior Function Level of Independence: Needs assistance   Gait / Transfers Assistance Needed: nonambulatory. Per family, has not stood up/been out of bed since January.     Comments: Pt last had home health PT in July 2022     Hand Dominance        Extremity/Trunk Assessment   Upper Extremity Assessment Upper Extremity Assessment: RUE deficits/detail;LUE deficits/detail RUE Deficits / Details: Strenth at least 3/5 LUE Deficits / Details: flaccid. no functional use    Lower Extremity Assessment Lower Extremity Assessment: RLE deficits/detail;LLE deficits/detail RLE Deficits / Details: Strength at least 2/5 LLE Deficits / Details: flaccid. no functional use       Communication   Communication: No difficulties  Cognition Arousal/Alertness: Lethargic;Suspect due to medications   Overall Cognitive Status: Difficult to assess                                 General Comments: pt did state her first  name and DOB      General Comments      Exercises General Exercises - Upper Extremity Shoulder Flexion: AROM;AAROM;Right;5 reps Elbow Flexion: AROM;AAROM;Right;5 reps General Exercises - Lower Extremity Ankle Circles/Pumps: AROM;Right;5 reps Quad Sets: AROM;Right;5 reps Heel Slides: AAROM;Right;5 reps Hip ABduction/ADduction: AAROM;Right;5 reps Straight Leg Raises: AAROM;Right;5 reps   Assessment/Plan    PT Assessment All further PT needs can be met in the next venue of care (will defer to HHPT)  PT Problem List Decreased strength;Decreased mobility;Decreased balance;Decreased coordination;Pain;Decreased skin integrity       PT Treatment Interventions      PT Goals (Current goals can be found in the Care Plan section)  Acute Rehab PT Goals Patient Stated Goal: pt unable to state. family  would like for HHPT to follow for ROM/stretching PT Goal Formulation: All assessment and education complete, DC therapy    Frequency     Barriers to discharge        Co-evaluation               AM-PAC PT "6 Clicks" Mobility  Outcome Measure Help needed turning from your back to your side while in a flat bed without using bedrails?: Total Help needed moving from lying on your back to sitting on the side of a flat bed without using bedrails?: Total Help needed moving to and from a bed to a chair (including a wheelchair)?: Total Help needed standing up from a chair using your arms (e.g., wheelchair or bedside chair)?: Total Help needed to walk in hospital room?: Total Help needed climbing 3-5 steps with a railing? : Total 6 Click Score: 6    End of Session   Activity Tolerance: Patient tolerated treatment well Patient left: in bed;with call bell/phone within reach;with family/visitor present        Time: 1430-1504 PT Time Calculation (min) (ACUTE ONLY): 34 min   Charges:   PT Evaluation $PT Eval Moderate Complexity: [place], PT Acute Rehabilitation  Office: [phone] Pager: [phone]

## 2020-11-23 NOTE — Progress Notes (Signed)
    OVERNIGHT PROGRESS REPORT  Notified by nursing for patient and family member ([id number]). Family member and patient requests to have code status changed to FULL Code. Patient and Family member are aware of the ramifications of this change in status and what is involved in the actions of this should the need arise.  Patient is now FULL Code at the request of patient and family member.    Gershon Cull MSNA MSN ACNPC-AG Acute Care Nurse Practitioner [place]

## 2020-11-23 NOTE — Progress Notes (Signed)
PROGRESS NOTE    Tonya [name]  [id number] DOB: 1940-12-01 DOA: 11/22/2020 PCP: Leeroy Cha, MD   Brief Narrative:  The patient is an 80 year old chronically ill-appearing African-American female with a past medical history significant for but not limited to stage IV adenocarcinoma of the colon, history of 3 recurrent DVTs with IVC filter no longer on Eliquis after life-threatening GI bleed, hypertension, type II diabetes type 2, pressure ulcer as well as other comorbidities who presented to the emergency department with several complaints including fatigue, swelling, worsening pressure ulcer on palate and dysuria.  She is accompanied by her daughter who assisted with her history. She was admitted to the hospital approximately 2 weeks ago with a GI bleed and was hypotensive and fluid resuscitated and transfused 2 units of PRBCs.  She had an EGD with antral erosions that were nonbleeding and the condition was felt to be related to her colon cancer.  She is not able to tolerate any further treatment for colon cancer due to her poor performance status.  Hospice has been recommended by multiple physicians including her cardiologist but the family believes the patient would want to continue to receive treatment for any treatable conditions.  She denied any chest pain or abdominal pain but acknowledged increased fatigue and did not feel short of breath.  She did complain about some pain in her lower back and upper buttock that her family attributes to her worsening pressure sore.  In the ED she was afebrile and saturating well on room air but she is slightly tachypneic.  EKG showed sinus rhythm.  Chemistry panel showed that she had hypoalbuminemia.  She was noted to have a chronic leukocytosis on her CBC and high-sensitivity troponins are elevated but flat.  BNP was 24.  Fecal occult blood testing was negative.  Chest x-ray was enlarging bilateral pleural effusions so she was given IV Lasix in the ED  however these bilateral pleural effusions are likely attributed to the malignant pleural effusions.  She underwent an echocardiogram given her swelling and anasarca and was found to have hokum.  Her diuresis has been stopped and her beta-blocker was increased.  Case was discussed with her cardiologist Dr. Einar Gip who recommended that they had no further options and does not recommend diuresis at this point and recommends hospice and palliative care.  Palliative care has been consulted for further evaluation recommendations.  Assessment & Plan:   Principal Problem:   Anasarca Active Problems:   Essential hypertension   CVA, old, hemiparesis ([place])   Chronic anemia   Colon cancer metastasized to liver ([place])   Goals of care, counseling/discussion   Type II diabetes mellitus (HCC)   Personal history of DVT (deep vein thrombosis)   Pressure ulcer   Pleural effusion   Acute on chronic diastolic CHF (congestive heart failure) (HCC)  Anasarca in the setting of Hypoalbuminemia and Cancer Chronic diastolic CHF  -Patient presents with anasarca, found to have elevated BNP, albumin 2.2, bilateral pleural effusions ([facility]) -EF was normal in 2018  -BNP was elevated at 203.7 however cardiology recommends against diuresis given her echo findings -Echocardiogram showed:   "1. Left ventricular ejection fraction, by estimation, is 70 to 75%. The  left ventricle has hyperdynamic function. The left ventricle has no  regional wall motion abnormalities. There is moderate asymmetric left  ventricular hypertrophy of the septal  segment. Left ventricular diastolic parameters are consistent with Grade I  diastolic dysfunction (impaired relaxation).   2. Right ventricular systolic function is  normal. The right ventricular  size is normal. There is normal pulmonary artery systolic pressure.   3. Large pleural effusion in the left lateral region.   4. There is severe systolic aanterior motion of the  anterior mitral  leaflet with outflow tract obstruction.. The mitral valve is abnormal.  Moderate to severe mitral valve regurgitation. No evidence of mitral  stenosis.   5. There is severe subvalvular left ventricular outflow tract obstruction  with a peak gradient of 112 mm Hgat rest. The aortic valve is tricuspid.  Aortic valve regurgitation is not visualized. No aortic stenosis is present."  - She was given IVF and RBC transfusions during recent admission when she had acute GIB and hypotension  - he was given IV Lasix in ED and is diuresing however echocardiogram was done and showed classic signs of HOCM -She is placed on a beta-blocker and this is increased to 50 mg -Diuresis is stopped and I spoke with cardiology about giving her fluid back and recommended against at this time it is recommended continuing the beta-blocker -Cardiology feels that she has no further options at this point and recommended palliative care and hospice transition but patient has been not agreeable to this -PT/OT recommending Home Health    Pleural effusions  - Enlarging b/l pleural effusions noted on CXR in ED  -Initially thought to be related to hypervolemia however cardiology feels that this is secondary to her malignancy -Cardiology recommends no further diuresis given her HOCM   Stage IV adenocarcinoma of colon - Unable to tolerate any further therapy for this, hospice recommended by oncology but family wants continued treatment  -Her prognosis is extremely poor and we will consult palliative care for further goals of care discussion  Chest pressure -In the setting of her diuresis from HOCM -Cardiology recommends no further diuresis and recommends continue to monitor -Troponins have been flat and ranging from 32 -> 32 -> 31 -> 35   Normocytic anemia  - Recently admitted with GIB, had antral erosions without bleeding on EGD, known colon cancer, and was discharged on BID Protonix  -Patient's  hemoglobin/hematocrit went from 9.5/31.6 and now 8.9/29.0 -Appears stable, FOBT negative this admission, continue PPI    Hypertension  - Continue Toprol as tolerated increased to 50 mg   Type 2 Diabetes Mellitus - A1c was only 4.5% last month  - Monitor and treat as-needed   -CBGs ranging from 139-149   Pressure ulcer  - Family reports worsening in chronic sacral ulcer, does not appear acutely infected  - Wound care    Hypoalbuminemia -Patient albumin level is 2.1 -We will consult nutrition check a prealbumin level in the a.m.  History of DVT  - Eliquis discontinued after recent life-threatening GI bleed  - She has IVC filter  - Current leg swelling symmetric and is setting of anasarca and hypoalbuminemia given her very poor nutritional status, no phlegmasia   Goals of Care -GOC discussed with patient and her daughter, Tonya [name] -Patient bedbound, sleeps most of the day, has metastatic colon cancer with inability to tolerate any further treatment, recurrent DVTs but also recurrent life-threatening GIB, and hospice has been recommended by multiple physicians and recently by cardiology - Tonya [name] feels her mother still has some quality of life and wants her to continue to receive treatment for any treatable condition but confirms DNR/DNI  -We will consult palliative care for further goals of care discussion   DVT prophylaxis: SCDS Code Status: DO NOT RESUSCITATE Family Communication: Discussed with Daughter  at bedside  Disposition Plan: Home Health PT/OT  Status is: Inpatient  Remains inpatient appropriate because:Unsafe d/c plan, IV treatments appropriate due to intensity of illness or inability to take PO, and Inpatient level of care appropriate due to severity of illness  Dispo: The patient is from: Home              Anticipated d/c is to: Home              Patient currently is not medically stable to d/c.   Difficult to place patient No  Consultants:  Discussed with her  Primary Cardiologist Dr. Einar Gip Palliative Care Medicine   Procedures:  ECHOCARDIOGRAM IMPRESSIONS     1. Left ventricular ejection fraction, by estimation, is 70 to 75%. The  left ventricle has hyperdynamic function. The left ventricle has no  regional wall motion abnormalities. There is moderate asymmetric left  ventricular hypertrophy of the septal  segment. Left ventricular diastolic parameters are consistent with Grade I  diastolic dysfunction (impaired relaxation).   2. Right ventricular systolic function is normal. The right ventricular  size is normal. There is normal pulmonary artery systolic pressure.   3. Large pleural effusion in the left lateral region.   4. There is severe systolic aanterior motion of the anterior mitral  leaflet with outflow tract obstruction.. The mitral valve is abnormal.  Moderate to severe mitral valve regurgitation. No evidence of mitral  stenosis.   5. There is severe subvalvular left ventricular outflow tract obstruction  with a peak gradient of 112 mm Hgat rest. The aortic valve is tricuspid.  Aortic valve regurgitation is not visualized. No aortic stenosis is  present.   Comparison(s): Prior images reviewed side by side. Changes from prior  study are noted. The previous study did not show systolic anterior motion  of the mitral valve or LVOT obstruction. Current pathophysiology may be  due to hypovolemia and/or  hyperadrenergic state. Findings discussed with primary team.   FINDINGS   Left Ventricle: Left ventricular ejection fraction, by estimation, is 70  to 75%. The left ventricle has hyperdynamic function. The left ventricle  has no regional wall motion abnormalities. The left ventricular internal  cavity size was small. There is  moderate asymmetric left ventricular hypertrophy of the septal segment.  Left ventricular diastolic parameters are consistent with Grade I  diastolic dysfunction (impaired relaxation). Normal left  ventricular  filling pressure.   Right Ventricle: The right ventricular size is normal. No increase in  right ventricular wall thickness. Right ventricular systolic function is  normal. There is normal pulmonary artery systolic pressure. The tricuspid  regurgitant velocity is 2.73 m/s, and   with an assumed right atrial pressure of 0 mmHg, the estimated right  ventricular systolic pressure is [id number] mmHg.   Left Atrium: Left atrial size was normal in size.   Right Atrium: Right atrial size was normal in size.   Pericardium: There is no evidence of pericardial effusion.   Mitral Valve: There is severe systolic aanterior motion of the anterior  mitral leaflet with outflow tract obstruction. The mitral valve is  abnormal. Moderate to severe mitral valve regurgitation, with eccentric  posteriorly directed jet. No evidence of  mitral valve stenosis.   Tricuspid Valve: The tricuspid valve is normal in structure. Tricuspid  valve regurgitation is mild.   Aortic Valve: There is severe subvalvular left ventricular outflow tract  obstruction with a peak gradient of 112 mm Hgat rest. The aortic valve is  tricuspid. Aortic valve  regurgitation is not visualized. No aortic  stenosis is present. Aortic valve peak  gradient measures 113.2 mmHg.   Pulmonic Valve: The pulmonic valve was not well visualized. Pulmonic valve  regurgitation is not visualized.   Aorta: The aortic root is normal in size and structure.   Venous: The inferior vena cava is collapsed consistent with low right  atrial pressure.   IAS/Shunts: No atrial level shunt detected by color flow Doppler.   Additional Comments: There is a large pleural effusion in the left lateral  region.      LEFT VENTRICLE  PLAX 2D  LVIDd:         2.69 cm     Diastology  LVIDs:         1.80 cm     LV e' medial:    3.98 cm/s  LV PW:         1.04 cm     LV E/e' medial:  12.7  LV IVS:        1.54 cm     LV e' lateral:   5.63 cm/s                              LV E/e' lateral: 9.0     LV Volumes (MOD)  LV vol d, MOD A4C: 24.5 ml  LV vol s, MOD A4C: 3.6 ml  LV SV MOD A4C:     24.5 ml   RIGHT VENTRICLE  RV Basal diam:  1.70 cm  RV Mid diam:    1.80 cm   LEFT ATRIUM             Index       RIGHT ATRIUM          Index  LA diam:        2.00 cm 0.94 cm/m  RA Area:     4.27 cm  LA Vol (A2C):   39.5 ml 18.55 ml/m RA Volume:   4.93 ml  2.32 ml/m  LA Vol (A4C):   22.0 ml 10.33 ml/m  LA Biplane Vol: 29.5 ml 13.86 ml/m   AORTIC VALVE              PULMONIC VALVE  AV Vmax:      532.08 cm/s PV Vmax:       1.18 m/s  AV Peak Grad: 113.2 mmHg  PV Vmean:      77.100 cm/s                            PV VTI:        0.183 m                            PV Peak grad:  5.6 mmHg                            PV Mean grad:  3.0 mmHg      MITRAL VALVE                 TRICUSPID VALVE  MV Area (PHT): 8.25 cm      TR Peak grad:   29.8 mmHg  MV Decel Time: 92 msec       TR Vmax:        273.00 cm/s  MR Peak grad:  187.2 mmHg  MR Vmax:         684.11 cm/s  MR PISA:         1.57 cm  MR PISA Eff ROA: 6 mm  MR PISA Radius:  0.50 cm  MV E velocity: 50.60 cm/s  MV A velocity: 132.00 cm/s  MV E/A ratio:  0.38   Antimicrobials:  Anti-infectives (From admission, onward)    None        Subjective: Seen and examined at bedside and she is complaining about some chest pressure during this morning.  No nausea or vomiting.  Felt okay.  Still has very swollen legs and extremities.  Denies any pain but does appear fatigued.  No other concerns or complaints at this time.  Objective: Vitals:   11/23/20 0900 11/23/20 1315 11/23/20 1622 11/23/20 1802  BP: 115/78 110/72  112/79  Pulse: 95 93 92 87  Resp: '20 20  20  '$ Temp: 97.8 F (36.6 C) 98.2 F (36.8 C)  97.7 F (36.5 C)  TempSrc: Oral Axillary  Axillary  SpO2: 97% 98%  96%  Weight: 92.8 kg     Height: '5\' 11"'$  (1.803 m)       Intake/Output Summary (Last 24 hours) at 11/23/2020 1901 Last data  filed at 11/23/2020 1800 Gross per 24 hour  Intake 240 ml  Output 875 ml  Net -635 ml   Filed Weights   11/22/20 1632 11/23/20 0900  Weight: 77.1 kg 92.8 kg   Examination: Physical Exam:  Constitutional: Chronically ill-appearing elderly African-American female currently in no acute distress appears fatigued  Eyes: Lids and conjunctivae normal, sclerae anicteric  ENMT: External Ears, Nose appear normal. Grossly normal hearing.  Neck: Appears normal, supple, no cervical masses, normal ROM, no appreciable thyromegaly; no JVD Respiratory: Diminished to auscultation bilaterally, no wheezing, rales, rhonchi or crackles. Normal respiratory effort and patient is not tachypenic. No accessory muscle use.  Unlabored breathing Cardiovascular: RRR, has a murmur noted.  1-2+ lower extremity edema Abdomen: Soft, non-tender, distended secondary body habitus.  Bowel sounds positive.  GU: Deferred. Musculoskeletal: No clubbing / cyanosis of digits/nails. No joint deformity upper and lower extremities.  Skin: No rashes, lesions, and I did not turn her to view her ulcer.  No induration; Warm and dry.  Neurologic: CN 2-12 grossly intact with no focal deficits. Romberg sign and cerebellar reflexes not assessed.  Psychiatric: Normal judgment and insight. Alert and oriented x 3. Mildly anxious mood and appropriate affect.   Data Reviewed: I have personally reviewed following labs and imaging studies  CBC: Recent Labs  Lab 11/22/20 1837 11/23/20 0349  WBC 12.4* 11.0*  NEUTROABS 10.3*  --   HGB 9.5* 8.9*  HCT 31.6* 29.0*  MCV 94.6 93.2  PLT 247 [id number]   Basic Metabolic Panel: Recent Labs  Lab 11/22/20 1837 11/23/20 0349  NA 142 143  K 3.7 3.5  CL 105 103  CO2 29 28  GLUCOSE 167* 153*  BUN 24* 23  CREATININE 0.92 0.91  CALCIUM 9.2 8.9  MG 2.2 2.0   GFR: Estimated Creatinine Clearance: 59.9 mL/min (by C-G formula based on SCr of 0.91 mg/dL). Liver Function Tests: Recent Labs  Lab  11/22/20 1837 11/23/20 0349  AST 42* 38  ALT 18 17  ALKPHOS 388* 321*  BILITOT 0.9 1.0  PROT 5.3* 5.0*  ALBUMIN 2.2* 2.1*   No results for input(s): LIPASE, AMYLASE in the last 168 hours. No results for input(s): AMMONIA in the last 168 hours. Coagulation  Profile: Recent Labs  Lab 11/22/20 1837  INR 1.1   Cardiac Enzymes: No results for input(s): CKTOTAL, CKMB, CKMBINDEX, TROPONINI in the last 168 hours. BNP (last 3 results) No results for input(s): PROBNP in the last 8760 hours. HbA1C: No results for input(s): HGBA1C in the last 72 hours. CBG: Recent Labs  Lab 11/23/20 0758 11/23/20 1110 11/23/20 1622  GLUCAP 139* 146* 149*   Lipid Profile: No results for input(s): CHOL, HDL, LDLCALC, TRIG, CHOLHDL, LDLDIRECT in the last 72 hours. Thyroid Function Tests: No results for input(s): TSH, T4TOTAL, FREET4, T3FREE, THYROIDAB in the last 72 hours. Anemia Panel: No results for input(s): VITAMINB12, FOLATE, FERRITIN, TIBC, IRON, RETICCTPCT in the last 72 hours. Sepsis Labs: No results for input(s): PROCALCITON, LATICACIDVEN in the last 168 hours.  Recent Results (from the past 240 hour(s))  Resp Panel by RT-PCR (Flu A&B, Covid) Nasopharyngeal Swab     Status: None   Collection Time: 11/22/20  9:38 PM   Specimen: Nasopharyngeal Swab; Nasopharyngeal(NP) swabs in vial transport medium  Result Value Ref Range Status   SARS Coronavirus 2 by RT PCR NEGATIVE NEGATIVE Final    Comment: (NOTE) SARS-CoV-2 target nucleic acids are NOT DETECTED.  The SARS-CoV-2 RNA is generally detectable in upper respiratory specimens during the acute phase of infection. The lowest concentration of SARS-CoV-2 viral copies this assay can detect is 138 copies/mL. A negative result does not preclude SARS-Cov-2 infection and should not be used as the sole basis for treatment or other patient management decisions. A negative result may occur with  improper specimen collection/handling, submission of  specimen other than nasopharyngeal swab, presence of viral mutation(s) within the areas targeted by this assay, and inadequate number of viral copies(<138 copies/mL). A negative result must be combined with clinical observations, patient history, and epidemiological information. The expected result is Negative.  Fact Sheet for Patients:  EntrepreneurPulse.com.au  Fact Sheet for Healthcare Providers:  IncredibleEmployment.be  This test is no t yet approved or cleared by the [place] FDA and  has been authorized for detection and/or diagnosis of SARS-CoV-2 by FDA under an Emergency Use Authorization (EUA). This EUA will remain  in effect (meaning this test can be used) for the duration of the COVID-19 declaration under Section 564(b)(1) of the Act, 21 U.S.C.section 360bbb-3(b)(1), unless the authorization is terminated  or revoked sooner.       Influenza A by PCR NEGATIVE NEGATIVE Final   Influenza B by PCR NEGATIVE NEGATIVE Final    Comment: (NOTE) The Xpert Xpress SARS-CoV-2/FLU/RSV plus assay is intended as an aid in the diagnosis of influenza from Nasopharyngeal swab specimens and should not be used as a sole basis for treatment. Nasal washings and aspirates are unacceptable for Xpert Xpress SARS-CoV-2/FLU/RSV testing.  Fact Sheet for Patients: EntrepreneurPulse.com.au  Fact Sheet for Healthcare Providers: IncredibleEmployment.be  This test is not yet approved or cleared by the [place] FDA and has been authorized for detection and/or diagnosis of SARS-CoV-2 by FDA under an Emergency Use Authorization (EUA). This EUA will remain in effect (meaning this test can be used) for the duration of the COVID-19 declaration under Section 564(b)(1) of the Act, 21 U.S.C. section 360bbb-3(b)(1), unless the authorization is terminated or revoked.  Performed at [facility], [place]  [address], [place], [place] [address]      RN Pressure Injury Documentation: Pressure Injury 11/04/20 Sacrum Stage 2 -  Partial thickness loss of dermis presenting as a shallow open injury with a red, pink  wound bed without slough. open wound, red, yellow slough (Active)  11/04/20 1615  Location: Sacrum  Location Orientation:   Staging: Stage 2 -  Partial thickness loss of dermis presenting as a shallow open injury with a red, pink wound bed without slough.  Wound Description (Comments): open wound, red, yellow slough  Present on Admission: Yes     Estimated body mass index is 28.54 kg/m as calculated from the following:   Height as of this encounter: '5\' 11"'$  (1.803 m).   Weight as of this encounter: 92.8 kg.  Malnutrition Type:   Malnutrition Characteristics:   Nutrition Interventions:    Radiology Studies: DG Chest 2 View  Result Date: 11/22/2020 CLINICAL DATA:  Dysuria, weakness, bedsore EXAM: CHEST - 2 VIEW COMPARISON:  11/04/2020 FINDINGS: Frontal and lateral views of the chest demonstrates stable right chest wall port. Cardiac silhouette is stable. There is increasing bibasilar consolidation and enlarging bilateral pleural effusions. No pneumothorax. No acute bony abnormalities. IMPRESSION: 1. Enlarging bilateral pleural effusions with increasing bibasilar consolidation, likely atelectasis. Electronically Signed   By: Randa Ngo M.D.   On: 11/22/2020 18:03   ECHOCARDIOGRAM COMPLETE  Result Date: 11/23/2020    ECHOCARDIOGRAM REPORT   Patient Name:   Tonya [name] Date of Exam: 11/23/2020 Medical Rec #:  [id number]      Height:       71.0 in Accession #:    [id number]     Weight:       204.6 lb Date of Birth:  1941/01/31      BSA:          2.129 m Patient Age:    55 years       BP:           112/75 mmHg Patient Gender: F              HR:           92 bpm. Exam Location:  Inpatient Procedure: 2D Echo, Cardiac Doppler, Color Doppler and Intracardiac            Opacification Agent  Indications:    CHF  History:        Patient has prior history of Echocardiogram examinations, most                 recent 08/28/2018. Stroke and COPD, Signs/Symptoms:Edema and                 Dyspnea; Risk Factors:Diabetes, Dyslipidemia and Hypertension.  Sonographer:    Luisa Hart RDCS Referring Phys: [id number] [place]  1. Left ventricular ejection fraction, by estimation, is 70 to 75%. The left ventricle has hyperdynamic function. The left ventricle has no regional wall motion abnormalities. There is moderate asymmetric left ventricular hypertrophy of the septal segment. Left ventricular diastolic parameters are consistent with Grade I diastolic dysfunction (impaired relaxation).  2. Right ventricular systolic function is normal. The right ventricular size is normal. There is normal pulmonary artery systolic pressure.  3. Large pleural effusion in the left lateral region.  4. There is severe systolic aanterior motion of the anterior mitral leaflet with outflow tract obstruction.. The mitral valve is abnormal. Moderate to severe mitral valve regurgitation. No evidence of mitral stenosis.  5. There is severe subvalvular left ventricular outflow tract obstruction with a peak gradient of 112 mm Hgat rest. The aortic valve is tricuspid. Aortic valve regurgitation is not visualized. No aortic stenosis is present. Comparison(s): Prior images reviewed side by  side. Changes from prior study are noted. The previous study did not show systolic anterior motion of the mitral valve or LVOT obstruction. Current pathophysiology may be due to hypovolemia and/or hyperadrenergic state. Findings discussed with primary team. FINDINGS  Left Ventricle: Left ventricular ejection fraction, by estimation, is 70 to 75%. The left ventricle has hyperdynamic function. The left ventricle has no regional wall motion abnormalities. The left ventricular internal cavity size was small. There is moderate asymmetric left ventricular  hypertrophy of the septal segment. Left ventricular diastolic parameters are consistent with Grade I diastolic dysfunction (impaired relaxation). Normal left ventricular filling pressure. Right Ventricle: The right ventricular size is normal. No increase in right ventricular wall thickness. Right ventricular systolic function is normal. There is normal pulmonary artery systolic pressure. The tricuspid regurgitant velocity is 2.73 m/s, and  with an assumed right atrial pressure of 0 mmHg, the estimated right ventricular systolic pressure is [id number] mmHg. Left Atrium: Left atrial size was normal in size. Right Atrium: Right atrial size was normal in size. Pericardium: There is no evidence of pericardial effusion. Mitral Valve: There is severe systolic aanterior motion of the anterior mitral leaflet with outflow tract obstruction. The mitral valve is abnormal. Moderate to severe mitral valve regurgitation, with eccentric posteriorly directed jet. No evidence of mitral valve stenosis. Tricuspid Valve: The tricuspid valve is normal in structure. Tricuspid valve regurgitation is mild. Aortic Valve: There is severe subvalvular left ventricular outflow tract obstruction with a peak gradient of 112 mm Hgat rest. The aortic valve is tricuspid. Aortic valve regurgitation is not visualized. No aortic stenosis is present. Aortic valve peak gradient measures 113.2 mmHg. Pulmonic Valve: The pulmonic valve was not well visualized. Pulmonic valve regurgitation is not visualized. Aorta: The aortic root is normal in size and structure. Venous: The inferior vena cava is collapsed consistent with low right atrial pressure. IAS/Shunts: No atrial level shunt detected by color flow Doppler. Additional Comments: There is a large pleural effusion in the left lateral region.  LEFT VENTRICLE PLAX 2D LVIDd:         2.69 cm     Diastology LVIDs:         1.80 cm     LV e' medial:    3.98 cm/s LV PW:         1.04 cm     LV E/e' medial:  12.7 LV IVS:         1.54 cm     LV e' lateral:   5.63 cm/s                            LV E/e' lateral: 9.0  LV Volumes (MOD) LV vol d, MOD A4C: 24.5 ml LV vol s, MOD A4C: 3.6 ml LV SV MOD A4C:     24.5 ml RIGHT VENTRICLE RV Basal diam:  1.70 cm RV Mid diam:    1.80 cm LEFT ATRIUM             Index       RIGHT ATRIUM          Index LA diam:        2.00 cm 0.94 cm/m  RA Area:     4.27 cm LA Vol (A2C):   39.5 ml 18.55 ml/m RA Volume:   4.93 ml  2.32 ml/m LA Vol (A4C):   22.0 ml 10.33 ml/m LA Biplane Vol: 29.5 ml 13.86 ml/m  AORTIC VALVE  PULMONIC VALVE AV Vmax:      532.08 cm/s PV Vmax:       1.18 m/s AV Peak Grad: 113.2 mmHg  PV Vmean:      77.100 cm/s                           PV VTI:        0.183 m                           PV Peak grad:  5.6 mmHg                           PV Mean grad:  3.0 mmHg  MITRAL VALVE                 TRICUSPID VALVE MV Area (PHT): 8.25 cm      TR Peak grad:   29.8 mmHg MV Decel Time: 92 msec       TR Vmax:        273.00 cm/s MR Peak grad:    187.2 mmHg MR Vmax:         684.11 cm/s MR PISA:         1.57 cm MR PISA Eff ROA: 6 mm MR PISA Radius:  0.50 cm MV E velocity: 50.60 cm/s MV A velocity: 132.00 cm/s MV E/A ratio:  0.38 Mihai Croitoru MD Electronically signed by Sanda Klein MD Signature Date/Time: 11/23/2020/2:44:38 PM    Final     Scheduled Meds:  Chlorhexidine Gluconate Cloth  6 each Topical Daily   insulin aspart  0-6 Units Subcutaneous TID WC   [START ON 11/24/2020] metoprolol succinate  50 mg Oral Daily   pantoprazole  40 mg Oral BID   polyethylene glycol  17 g Oral Daily   sodium chloride flush  10-40 mL Intracatheter Q12H   Continuous Infusions:   LOS: 1 day   Kerney Elbe, DO [facility] PAGER is on AMION  If 7PM-7AM, please contact night-coverage [email or web address]

## 2020-11-23 NOTE — Progress Notes (Signed)
Patient complaining of 3/10 chest pressure.  EKG completed, vital signs stable. Dr. Alfredia Ferguson aware.  Order placed for stat troponin's.    Checked back in on patient at 1000.  Patient asleep.

## 2020-11-23 NOTE — Progress Notes (Signed)
[facility]) Hospital Liaison note:  This patient is currently enrolled in [facility] outpatient-based Palliative Care. Will continue to follow for disposition.  Please call with any outpatient palliative questions or concerns.  Thank you, Lorelee Market, LPN [facility] Liaison [phone]

## 2020-11-24 ENCOUNTER — Inpatient Hospital Stay (HOSPITAL_COMMUNITY): Payer: Medicare HMO

## 2020-11-24 DIAGNOSIS — R531 Weakness: Secondary | ICD-10-CM

## 2020-11-24 DIAGNOSIS — D649 Anemia, unspecified: Secondary | ICD-10-CM | POA: Diagnosis not present

## 2020-11-24 DIAGNOSIS — Z7189 Other specified counseling: Secondary | ICD-10-CM | POA: Diagnosis not present

## 2020-11-24 DIAGNOSIS — C189 Malignant neoplasm of colon, unspecified: Secondary | ICD-10-CM | POA: Diagnosis not present

## 2020-11-24 DIAGNOSIS — R601 Generalized edema: Secondary | ICD-10-CM | POA: Diagnosis not present

## 2020-11-24 DIAGNOSIS — Z515 Encounter for palliative care: Secondary | ICD-10-CM

## 2020-11-24 DIAGNOSIS — I10 Essential (primary) hypertension: Secondary | ICD-10-CM | POA: Diagnosis not present

## 2020-11-24 LAB — COMPREHENSIVE METABOLIC PANEL
ALT: 17 U/L (ref 0–44)
AST: 39 U/L (ref 15–41)
Albumin: 2 g/dL — ABNORMAL LOW (ref 3.5–5.0)
Alkaline Phosphatase: 347 U/L — ABNORMAL HIGH (ref 38–126)
Anion gap: 6 (ref 5–15)
BUN: 22 mg/dL (ref 8–23)
CO2: 33 mmol/L — ABNORMAL HIGH (ref 22–32)
Calcium: 8.9 mg/dL (ref 8.9–10.3)
Chloride: 103 mmol/L (ref 98–111)
Creatinine, Ser: 0.93 mg/dL (ref 0.44–1.00)
GFR, Estimated: >60 mL/min (ref >60)
Glucose, Bld: 145 mg/dL — ABNORMAL HIGH (ref 70–99)
Potassium: 3.1 mmol/L — ABNORMAL LOW (ref 3.5–5.1)
Sodium: 142 mmol/L (ref 135–145)
Total Bilirubin: 0.9 mg/dL (ref 0.3–1.2)
Total Protein: 4.8 g/dL — ABNORMAL LOW (ref 6.5–8.1)

## 2020-11-24 LAB — GLUCOSE, CAPILLARY
Glucose-Capillary: 148 mg/dL — ABNORMAL HIGH (ref 70–99)
Glucose-Capillary: 167 mg/dL — ABNORMAL HIGH (ref 70–99)
Glucose-Capillary: 140 mg/dL — ABNORMAL HIGH (ref 70–99)

## 2020-11-24 LAB — CBC WITH DIFFERENTIAL/PLATELET
Abs Immature Granulocytes: 0.06 10*3/uL (ref 0.00–0.07)
Basophils Absolute: 0 10*3/uL (ref 0.0–0.1)
Basophils Relative: 0 %
Eosinophils Absolute: 0.1 10*3/uL (ref 0.0–0.5)
Eosinophils Relative: 1 %
HCT: 27.4 % — ABNORMAL LOW (ref 36.0–46.0)
Hemoglobin: 8.4 g/dL — ABNORMAL LOW (ref 12.0–15.0)
Immature Granulocytes: 1 %
Lymphocytes Relative: 6 %
Lymphs Abs: 0.6 10*3/uL — ABNORMAL LOW (ref 0.7–4.0)
MCH: 28.9 pg (ref 26.0–34.0)
MCHC: 30.7 g/dL (ref 30.0–36.0)
MCV: 94.2 fL (ref 80.0–100.0)
Monocytes Absolute: 1.5 10*3/uL — ABNORMAL HIGH (ref 0.1–1.0)
Monocytes Relative: 16 %
Neutro Abs: 7.3 10*3/uL (ref 1.7–7.7)
Neutrophils Relative %: 76 %
Platelets: 210 10*3/uL (ref 150–400)
RBC: 2.91 MIL/uL — ABNORMAL LOW (ref 3.87–5.11)
RDW: 16.6 % — ABNORMAL HIGH (ref 11.5–15.5)
WBC: 9.5 10*3/uL (ref 4.0–10.5)
nRBC: 0 % (ref 0.0–0.2)

## 2020-11-24 LAB — OCCULT BLOOD X 1 CARD TO LAB, STOOL: Fecal Occult Bld: NEGATIVE

## 2020-11-24 LAB — MAGNESIUM: Magnesium: 2 mg/dL (ref 1.7–2.4)

## 2020-11-24 LAB — PHOSPHORUS: Phosphorus: 3.1 mg/dL (ref 2.5–4.6)

## 2020-11-24 MED ORDER — POTASSIUM CHLORIDE CRYS ER 20 MEQ PO TBCR: 40.0000 meq | EXTENDED_RELEASE_TABLET | Freq: Once | ORAL | Status: DC

## 2020-11-24 MED ORDER — POTASSIUM CHLORIDE CRYS ER 20 MEQ PO TBCR
20.0000 meq | EXTENDED_RELEASE_TABLET | Freq: Every day | ORAL | Status: DC
  Filled 2020-11-24: qty 1

## 2020-11-24 MED ORDER — POLYETHYLENE GLYCOL 3350 17 G PO PACK: 17.0000 g | PACK | Freq: Every day | ORAL | 0 refills | Status: AC

## 2020-11-24 MED ORDER — TRAMADOL HCL 50 MG PO TABS: 50.0000 mg | ORAL_TABLET | Freq: Four times a day (QID) | ORAL | Status: DC | PRN

## 2020-11-24 MED ORDER — POTASSIUM CHLORIDE CRYS ER 20 MEQ PO TBCR
40.0000 meq | EXTENDED_RELEASE_TABLET | ORAL | Status: AC
Start: 2020-11-24 — End: 2020-11-24
  Administered 2020-11-24 (×2): 40 meq via ORAL
  Filled 2020-11-24 (×2): qty 2

## 2020-11-24 MED ORDER — HEPARIN SOD (PORK) LOCK FLUSH 100 UNIT/ML IV SOLN
500.0000 [IU] | INTRAVENOUS | Status: AC | PRN
  Administered 2020-11-24: 500 [IU]
  Filled 2020-11-24: qty 5

## 2020-11-24 MED ORDER — MECLIZINE HCL 25 MG PO TABS: 12.5000 mg | ORAL_TABLET | Freq: Three times a day (TID) | ORAL | Status: DC | PRN

## 2020-11-24 MED ORDER — MELATONIN 5 MG PO TABS: 10.0000 mg | ORAL_TABLET | Freq: Every evening | ORAL | Status: DC | PRN

## 2020-11-24 MED ORDER — SENNOSIDES-DOCUSATE SODIUM 8.6-50 MG PO TABS: 1.0000 | ORAL_TABLET | Freq: Every evening | ORAL | 0 refills | Status: AC | PRN

## 2020-11-24 MED ORDER — METOPROLOL SUCCINATE ER 50 MG PO TB24: 50.0000 mg | ORAL_TABLET | Freq: Every day | ORAL | 1 refills | Status: AC

## 2020-11-24 NOTE — Discharge Summary (Signed)
Physician Discharge Summary  [name] [id number] DOB: January 17, 1941 DOA: 11/22/2020  PCP: Leeroy Cha, MD  Admit date: 11/22/2020 Discharge date: 11/24/2020  Time spent: 60 minutes  Recommendations for Outpatient Follow-up:  Patient will be discharged home with home health services. Follow-up with palliative care at home in the outpatient setting. Follow-up with Dr. Einar Gip, cardiology in 2 weeks for follow-up on HOCM. Follow-up with Leeroy Cha, MD in 2 weeks.  On follow-up patient will need a basic metabolic profile done to follow-up on electrolytes and renal function.   Discharge Diagnoses:  Principal Problem:   Anasarca Active Problems:   Essential hypertension   CVA, old, hemiparesis ([place])   Chronic anemia   Colon cancer metastasized to liver ([place])   Goals of care, counseling/discussion   Type II diabetes mellitus ([place])   Personal history of DVT (deep vein thrombosis)   Pressure ulcer   Pleural effusion   Acute on chronic diastolic CHF (congestive heart failure) ([place])   Discharge Condition: Stable  Diet recommendation: Heart healthy  Filed Weights   11/22/20 1632 11/23/20 0900 11/24/20 0431  Weight: 77.1 kg 92.8 kg 93.4 kg    History of present illness:  HPI per Dr. Limmie Patricia Tonya [name] is a 80 y.o. female with medical history significant for stage IV adenocarcinoma of the colon, 3 of recurrent DVT with IVC filter no longer on Eliquis after life-threatening GI bleeding, hypertension, type 2 diabetes mellitus, and pressure ulcer, now presenting to the emergency department with several complaints including increased fatigue, swelling, worsening pressure ulcer, pallor, and dysuria.  Patient is accompanied by her daughter who assists with history.  Patient was admitted to the hospital 2 weeks ago with GI bleed, was hypotensive and fluid resuscitated, transfused with 2 units of RBC, had EGD with antral erosions that were nonbleeding and condition was  felt to be related to her colon cancer.  She has been unable to tolerate any further treatment for colon cancer due to her poor performance status.  Hospice has been recommended by multiple physicians but family believes the patient would want to continue receiving treatment for any treatable conditions.  Patient denies any chest pain or abdominal pain, acknowledges increased fatigue, does not feel short of breath.  Patient complains of some pain at lower back and upper buttock that her family attributes to a worsening pressure sore.   ED Course: Upon arrival to the ED, patient is found to be afebrile, saturating well on room air, slightly tachypneic, and with stable blood pressure.  EKG features sinus rhythm.  Chemistry panel with elevated alkaline phosphatase, albumin 2.2, and total protein 5.3.  CBC with chronic leukocytosis and hemoglobin 9.5.  High-sensitivity troponin is slightly elevated and flat.  BNP is 204.  Fecal occult blood testing negative.  Chest x-ray with enlarging bilateral pleural effusions.  Patient was given 60 mg IV Lasix in the ED.  Hospital Course:  #1 anasarca in the setting of hypoalbuminemia and stage IV adenocarcinoma of the colon/chronic diastolic CHF -Patient had presented with anasarca noted to have elevated BNP, albumin level of 2.2, bilateral pleural effusions felt likely malignant. -2D echo done in 2018 noted to have a normal EF. -2D echo ordered during this admission with a EF of 70 to 75%,NWMA, moderate asymmetric LVH of the septal segment, left ventricular diastolic parameters consistent with grade 1 diastolic dysfunction, normal right ventricular systolic function, right ventricular size normal, large pleural effusion the left lateral region, severe systolic anterior motion of the anterior  mitral leaflet with outflow tract obstruction, mitral valve abnormal, moderate to severe mitral valve regurg, no evidence of mitral stenosis.  Severe subvalvular left ventricular  outflow tract obstruction with peak gradient of '1 1 2 '$ mmHg at rest, aortic valve tricuspid, AVR not visualized, no aortic stenosis present. -Per Dr.Sheikh Case discussed with cardiology, Dr. Einar Gip who recommended against diuresis given echo findings of HOCM -Patient received IV fluids and RBC transfusion during recent hospitalization for acute GI bleed. -Patient noted to have received a dose of IV Lasix in the ED which was subsequently discontinued per cardiology recommendations. -Patient placed back on home regimen beta-blocker and dose increased to 50 mg daily per cardiology recommendations. -It is noted that cardiology feels patient has no further options at this point and recommended palliative care and hospice transition at this point in time not ready for hospice and agreeable to home with palliative care following. -Patient also seen by palliative care during the hospitalization recommended home with palliative following and subsequent transition to hospice if patient declines. -Patient seen by PT OT who recommended home health therapies which have been ordered.  2.  Pleural effusions -Patient noted to have bilateral pleural effusions noted on chest x-ray -Initial presentation thought to be related to hypervolemia, however cardiology felt effusions likely more secondary to malignancy and recommended no further diuresis given HOCM. -Patient was not hypoxic during the hospitalization. -Outpatient follow-up.  3.  Stage IV adenocarcinoma of the colon -It is noted that patient unable to tolerate any further therapy, hospice noted to be recommended per oncology but family wanted continue treatment at this time. -Patient with extremely poor prognosis and as such palliative care consulted. -Palliative care had goals of care discussion with patient's daughter, Tonya [name] who is healthcare power of attorney and decision was made to continue full CODE STATUS, family requesting home health PT OT, and  accepting of continuing with palliative services at home.  4.  Chest pressure/HOCM -In the setting of diuresis from HOCM -It was noted per Dr. Alfredia Ferguson that cardiology recommended NO further diuresis recommended continued monitoring. -Cardiac enzymes which were drawn with flat. -2D echo done with a EF of 70 to 75%,NWMA, moderate asymmetric LVH of the septal segment, left ventricular diastolic parameters consistent with grade 1 diastolic dysfunction, normal right ventricular systolic function, right ventricular size normal, large pleural effusion the left lateral region, severe systolic anterior motion of the anterior mitral leaflet with outflow tract obstruction, mitral valve abnormal, moderate to severe mitral valve regurg, no evidence of mitral stenosis.  Severe subvalvular left ventricular outflow tract obstruction with peak gradient of '1 1 2 '$ mmHg at rest, aortic valve tricuspid, AVR not visualized, no aortic stenosis present. -Patient's Toprol-XL dose increased to 50 mg daily per cardiology recommendations. -Outpatient follow-up.  5.  Normocytic anemia -Patient noted with recent hospitalization for GI bleed with antral erosions without bleeding noted on EGD, history of known colon cancer and discharged home on twice daily PPI. -Patient maintained on PPI twice daily during the hospitalization. -FOBT done was negative. -Hemoglobin remained stable at 8.4.  Outpatient follow-up.  6.  Hypertension -Remain controlled on Toprol-XL.  7.  Well-controlled diabetes mellitus type 2 -Hemoglobin A1c 4.5 (11/05/2020) -CBGs remain controlled. -Outpatient follow-up.  8.  Hypoalbuminemia -Patient noted to have albumin level of 2.1. -Outpatient follow-up.  9.  History of DVT -Eliquis discontinued after recent life-threatening GI bleed. -Patient noted with history of IVC filter. -Outpatient follow-up.  10. pressure ulcer, POA Pressure Injury 11/04/20 Sacrum Stage 2 -  Partial thickness loss of dermis  presenting as a shallow open injury with a red, pink wound bed without slough. open wound, red, yellow slough (Active)  11/04/20 1615  Location: Sacrum  Location Orientation:   Staging: Stage 2 -  Partial thickness loss of dermis presenting as a shallow open injury with a red, pink wound bed without slough.  Wound Description (Comments): open wound, red, yellow slough  Present on Admission: Yes        Procedures: Chest x-ray 11/22/2020, 11/24/2020 2D echo 11/23/2020  Consultations: Palliative care: Dr. Rowe Pavy 11/24/2020  Discharge Exam: Vitals:   11/24/20 0419 11/24/20 1239  BP: 117/72 113/64  Pulse: 82 84  Resp: (!) 23 16  Temp: 97.7 F (36.5 C) (!) 97.5 F (36.4 C)  SpO2: 98% 98%    General: NAD. Cardiovascular: RRR with 3/6 SEM Respiratory: CTA B anterior lung fields.  Discharge Instructions   Discharge Instructions     Diet - low sodium heart healthy   Complete by: As directed    Discharge wound care:   Complete by: As directed    As above   Increase activity slowly   Complete by: As directed       Allergies as of 11/24/2020       Reactions   Dilaudid [hydromorphone]    unconscious   Hydrocodone Swelling, Other (See Comments)   "I started swelling, became red, and passed out"   Iohexol Hives, Shortness Of Breath, Other (See Comments)   Patient developed hives and fullness in throat post injection of 125cc's Omni 300, Onset Date: 11/15/2006   Morphine And Related Other (See Comments)   Unconscious, can't talk for 2 days   Naproxen Other (See Comments)   "It made me feel out of my head"   Other Other (See Comments)   Ibuprofen Nausea Only   Propoxyphene N-acetaminophen Other (See Comments)   "I couldn't find the door to make my way out of the room- I was in misery"        Medication List     STOP taking these medications    ciprofloxacin 250 MG tablet Commonly known as: CIPRO   torsemide 20 MG tablet Commonly known as: DEMADEX   Tresiba  FlexTouch 100 UNIT/ML FlexTouch Pen Generic drug: insulin degludec       TAKE these medications    acetaminophen 500 MG tablet Commonly known as: TYLENOL Take 500 mg by mouth every 6 (six) hours as needed for moderate pain.   B-D SINGLE USE SWABS REGULAR Pads 1 each by Does not apply route as needed (to cleanse site prior to obtaining droplet of blood for CBG's). E11.9   B-D ULTRAFINE III SHORT PEN 31G X 8 MM Misc Generic drug: Insulin Pen Needle   famotidine 40 MG tablet Commonly known as: PEPCID Take 40 mg by mouth 2 (two) times daily.   lidocaine-prilocaine cream Commonly known as: EMLA Apply 1 application topically as needed for up to 30 doses.   meclizine 12.5 MG tablet Commonly known as: ANTIVERT TAKE 1 TABLET BY MOUTH THREE TIMES DAILY AS NEEDED FOR DIZZINESS   Melatonin 10 MG Tabs Take 1 tablet by mouth at bedtime as needed (sleep).   Mepilex Border Flex [employer] Apply 1 each topically daily.   metoprolol succinate 50 MG 24 hr tablet Commonly known as: TOPROL-XL Take 1 tablet (50 mg total) by mouth daily. What changed:  medication strength how much to take how to take this   ondansetron 8 MG  tablet Commonly known as: ZOFRAN TAKE 1 TABLET(8 MG) BY MOUTH EVERY 8 HOURS AS NEEDED FOR NAUSEA OR VOMITING   pantoprazole 40 MG tablet Commonly known as: PROTONIX Take 1 tablet (40 mg total) by mouth 2 (two) times daily.   polyethylene glycol 17 g packet Commonly known as: MIRALAX / GLYCOLAX Take 17 g by mouth daily. Start taking on: November 25, 2020   potassium chloride SA 20 MEQ tablet Commonly known as: KLOR-CON Take 20 mEq by mouth daily.   senna-docusate 8.6-50 MG tablet Commonly known as: Senokot-S Take 1 tablet by mouth at bedtime as needed for mild constipation.   traMADol 50 MG tablet Commonly known as: ULTRAM TAKE 1 TABLET(50 MG) BY MOUTH EVERY 6 HOURS AS NEEDED   True Metrix Blood Glucose Test test strip Generic drug: glucose blood TEST  BLOOD SUGAR TWICE DAILY   TRUEplus Lancets 30G Misc 1 each by Does not apply route 2 (two) times daily. E11.9               Durable Medical Equipment  (From admission, onward)           Start     Ordered   11/24/20 1502  For home use only DME Air overlay mattress  Once        11/24/20 1501              Discharge Care Instructions  (From admission, onward)           Start     Ordered   11/24/20 0000  Discharge wound care:       Comments: As above   11/24/20 1504           Allergies  Allergen Reactions   Dilaudid [Hydromorphone]     unconscious   Hydrocodone Swelling and Other (See Comments)    "I started swelling, became red, and passed out"   Iohexol Hives, Shortness Of Breath and Other (See Comments)    Patient developed hives and fullness in throat post injection of 125cc's Omni 300, Onset Date: 11/15/2006    Morphine And Related Other (See Comments)    Unconscious, can't talk for 2 days   Naproxen Other (See Comments)    "It made me feel out of my head"   Other Other (See Comments)   Ibuprofen Nausea Only   Propoxyphene N-Acetaminophen Other (See Comments)    "I couldn't find the door to make my way out of the room- I was in misery"    Follow-up Information     [facility], [facility] Follow up.   Specialty: [place] Why: [facility] nursing/physical therapy/occupational therapy/aide/social worker Contact information: [place] [address] [phone]         Leeroy Cha, MD. Schedule an appointment as soon as possible for a visit in 2 week(s).   Specialty: Internal Medicine Contact information: [address] [place] [address] [phone]         Adrian Prows, MD. Schedule an appointment as soon as possible for a visit in 2 week(s).   Specialty: Cardiology Contact information: [place] [address] [phone]         [facility]  Palliative Follow up in 1 week(s).   Contact information: [place] [phone]                 The results of significant diagnostics from this hospitalization (including  imaging, microbiology, ancillary and laboratory) are listed below for reference.    Significant Diagnostic Studies: DG Chest 2 View  Result Date: 11/22/2020 CLINICAL DATA:  Dysuria, weakness, bedsore EXAM: CHEST - 2 VIEW COMPARISON:  11/04/2020 FINDINGS: Frontal and lateral views of the chest demonstrates stable right chest wall port. Cardiac silhouette is stable. There is increasing bibasilar consolidation and enlarging bilateral pleural effusions. No pneumothorax. No acute bony abnormalities. IMPRESSION: 1. Enlarging bilateral pleural effusions with increasing bibasilar consolidation, likely atelectasis. Electronically Signed   By: Randa Ngo M.D.   On: 11/22/2020 18:03   DG CHEST PORT 1 VIEW  Result Date: 11/24/2020 CLINICAL DATA:  Shortness of breath. EXAM: PORTABLE CHEST 1 VIEW COMPARISON:  11/22/2020.  11/04/2020. FINDINGS: PowerPort catheter noted with tip over right atrium. Heart size stable. Prominent layering pleural effusions again noted. Pleural effusions appear to have increased in size from prior exam. Underlying pulmonary infiltrates/atelectasis cannot be excluded. No pneumothorax. Degenerative change thoracic spine. IMPRESSION: 1.  PowerPort catheter with tip over right atrium. 2. Prominent layering pleural effusions again noted. Pleural effusions appear to have increased in size from prior exam. Underlying pulmonary infiltrates/atelectasis cannot be excluded. Electronically Signed   By: Marcello Moores  Register   On: 11/24/2020 08:01   DG Chest Port 1 View  Result Date: 11/04/2020 CLINICAL DATA:  The patient reports right red blood per rectum this morning EXAM: PORTABLE CHEST 1 VIEW COMPARISON:  PA and lateral chest 12/02/2013. CT chest, abdomen and pelvis 07/08/2020.  FINDINGS: Right Port-A-Cath is in place. The patient has moderate right and small left pleural effusions with associated basilar airspace disease. Aortic atherosclerosis is noted. Heart size is normal. No acute or focal bony abnormality. IMPRESSION: Moderate right and small left pleural effusions and basilar airspace disease are new since the most recent exam. Aortic Atherosclerosis (ICD10-I70.0). Electronically Signed   By: Inge Rise M.D.   On: 11/04/2020 14:22   ECHOCARDIOGRAM COMPLETE  Result Date: 11/23/2020    ECHOCARDIOGRAM REPORT   Patient Name:   Tonya [name] Date of Exam: 11/23/2020 Medical Rec #:  [id number]      Height:       71.0 in Accession #:    [id number]     Weight:       204.6 lb Date of Birth:  1940/10/08      BSA:          2.129 m Patient Age:    80 years       BP:           112/75 mmHg Patient Gender: F              HR:           92 bpm. Exam Location:  Inpatient Procedure: 2D Echo, Cardiac Doppler, Color Doppler and Intracardiac            Opacification Agent Indications:    CHF  History:        Patient has prior history of Echocardiogram examinations, most                 recent 08/28/2018. Stroke and COPD, Signs/Symptoms:Edema and                 Dyspnea; Risk Factors:Diabetes, Dyslipidemia and Hypertension.  Sonographer:    Luisa Hart RDCS Referring Phys: [id number] [place]  1. Left ventricular ejection fraction, by estimation, is 70 to 75%. The left ventricle has hyperdynamic function. The left ventricle has no regional  wall motion abnormalities. There is moderate asymmetric left ventricular hypertrophy of the septal segment. Left ventricular diastolic parameters are consistent with Grade I diastolic dysfunction (impaired relaxation).  2. Right ventricular systolic function is normal. The right ventricular size is normal. There is normal pulmonary artery systolic pressure.  3. Large pleural effusion in the left lateral region.  4. There is severe systolic aanterior  motion of the anterior mitral leaflet with outflow tract obstruction.. The mitral valve is abnormal. Moderate to severe mitral valve regurgitation. No evidence of mitral stenosis.  5. There is severe subvalvular left ventricular outflow tract obstruction with a peak gradient of 112 mm Hgat rest. The aortic valve is tricuspid. Aortic valve regurgitation is not visualized. No aortic stenosis is present. Comparison(s): Prior images reviewed side by side. Changes from prior study are noted. The previous study did not show systolic anterior motion of the mitral valve or LVOT obstruction. Current pathophysiology may be due to hypovolemia and/or hyperadrenergic state. Findings discussed with primary team. FINDINGS  Left Ventricle: Left ventricular ejection fraction, by estimation, is 70 to 75%. The left ventricle has hyperdynamic function. The left ventricle has no regional wall motion abnormalities. The left ventricular internal cavity size was small. There is moderate asymmetric left ventricular hypertrophy of the septal segment. Left ventricular diastolic parameters are consistent with Grade I diastolic dysfunction (impaired relaxation). Normal left ventricular filling pressure. Right Ventricle: The right ventricular size is normal. No increase in right ventricular wall thickness. Right ventricular systolic function is normal. There is normal pulmonary artery systolic pressure. The tricuspid regurgitant velocity is 2.73 m/s, and  with an assumed right atrial pressure of 0 mmHg, the estimated right ventricular systolic pressure is [id number] mmHg. Left Atrium: Left atrial size was normal in size. Right Atrium: Right atrial size was normal in size. Pericardium: There is no evidence of pericardial effusion. Mitral Valve: There is severe systolic aanterior motion of the anterior mitral leaflet with outflow tract obstruction. The mitral valve is abnormal. Moderate to severe mitral valve regurgitation, with eccentric posteriorly  directed jet. No evidence of mitral valve stenosis. Tricuspid Valve: The tricuspid valve is normal in structure. Tricuspid valve regurgitation is mild. Aortic Valve: There is severe subvalvular left ventricular outflow tract obstruction with a peak gradient of 112 mm Hgat rest. The aortic valve is tricuspid. Aortic valve regurgitation is not visualized. No aortic stenosis is present. Aortic valve peak gradient measures 113.2 mmHg. Pulmonic Valve: The pulmonic valve was not well visualized. Pulmonic valve regurgitation is not visualized. Aorta: The aortic root is normal in size and structure. Venous: The inferior vena cava is collapsed consistent with low right atrial pressure. IAS/Shunts: No atrial level shunt detected by color flow Doppler. Additional Comments: There is a large pleural effusion in the left lateral region.  LEFT VENTRICLE PLAX 2D LVIDd:         2.69 cm     Diastology LVIDs:         1.80 cm     LV e' medial:    3.98 cm/s LV PW:         1.04 cm     LV E/e' medial:  12.7 LV IVS:        1.54 cm     LV e' lateral:   5.63 cm/s                            LV E/e' lateral: 9.0  LV Volumes (MOD) LV  vol d, MOD A4C: 24.5 ml LV vol s, MOD A4C: 3.6 ml LV SV MOD A4C:     24.5 ml RIGHT VENTRICLE RV Basal diam:  1.70 cm RV Mid diam:    1.80 cm LEFT ATRIUM             Index       RIGHT ATRIUM          Index LA diam:        2.00 cm 0.94 cm/m  RA Area:     4.27 cm LA Vol (A2C):   39.5 ml 18.55 ml/m RA Volume:   4.93 ml  2.32 ml/m LA Vol (A4C):   22.0 ml 10.33 ml/m LA Biplane Vol: 29.5 ml 13.86 ml/m  AORTIC VALVE              PULMONIC VALVE AV Vmax:      532.08 cm/s PV Vmax:       1.18 m/s AV Peak Grad: 113.2 mmHg  PV Vmean:      77.100 cm/s                           PV VTI:        0.183 m                           PV Peak grad:  5.6 mmHg                           PV Mean grad:  3.0 mmHg  MITRAL VALVE                 TRICUSPID VALVE MV Area (PHT): 8.25 cm      TR Peak grad:   29.8 mmHg MV Decel Time: 92 msec        TR Vmax:        273.00 cm/s MR Peak grad:    187.2 mmHg MR Vmax:         684.11 cm/s MR PISA:         1.57 cm MR PISA Eff ROA: 6 mm MR PISA Radius:  0.50 cm MV E velocity: 50.60 cm/s MV A velocity: 132.00 cm/s MV E/A ratio:  0.38 Mihai Croitoru MD Electronically signed by Sanda Klein MD Signature Date/Time: 11/23/2020/2:44:38 PM    Final     Microbiology: Recent Results (from the past 240 hour(s))  Resp Panel by RT-PCR (Flu A&B, Covid) Nasopharyngeal Swab     Status: None   Collection Time: 11/22/20  9:38 PM   Specimen: Nasopharyngeal Swab; Nasopharyngeal(NP) swabs in vial transport medium  Result Value Ref Range Status   SARS Coronavirus 2 by RT PCR NEGATIVE NEGATIVE Final    Comment: (NOTE) SARS-CoV-2 target nucleic acids are NOT DETECTED.  The SARS-CoV-2 RNA is generally detectable in upper respiratory specimens during the acute phase of infection. The lowest concentration of SARS-CoV-2 viral copies this assay can detect is 138 copies/mL. A negative result does not preclude SARS-Cov-2 infection and should not be used as the sole basis for treatment or other patient management decisions. A negative result may occur with  improper specimen collection/handling, submission of specimen other than nasopharyngeal swab, presence of viral mutation(s) within the areas targeted by this assay, and inadequate number of viral copies(<138 copies/mL). A negative result must be combined with clinical observations, patient history, and epidemiological information. The expected  result is Negative.  Fact Sheet for Patients:  EntrepreneurPulse.com.au  Fact Sheet for Healthcare Providers:  IncredibleEmployment.be  This test is no t yet approved or cleared by the [place] FDA and  has been authorized for detection and/or diagnosis of SARS-CoV-2 by FDA under an Emergency Use Authorization (EUA). This EUA will remain  in effect (meaning this test can be used)  for the duration of the COVID-19 declaration under Section 564(b)(1) of the Act, 21 U.S.C.section 360bbb-3(b)(1), unless the authorization is terminated  or revoked sooner.       Influenza A by PCR NEGATIVE NEGATIVE Final   Influenza B by PCR NEGATIVE NEGATIVE Final    Comment: (NOTE) The Xpert Xpress SARS-CoV-2/FLU/RSV plus assay is intended as an aid in the diagnosis of influenza from Nasopharyngeal swab specimens and should not be used as a sole basis for treatment. Nasal washings and aspirates are unacceptable for Xpert Xpress SARS-CoV-2/FLU/RSV testing.  Fact Sheet for Patients: EntrepreneurPulse.com.au  Fact Sheet for Healthcare Providers: IncredibleEmployment.be  This test is not yet approved or cleared by the [place] FDA and has been authorized for detection and/or diagnosis of SARS-CoV-2 by FDA under an Emergency Use Authorization (EUA). This EUA will remain in effect (meaning this test can be used) for the duration of the COVID-19 declaration under Section 564(b)(1) of the Act, 21 U.S.C. section 360bbb-3(b)(1), unless the authorization is terminated or revoked.  Performed at [facility], [place] [address], [place],  [address]      Labs: Basic Metabolic Panel: Recent Labs  Lab 11/22/20 1837 11/23/20 0349 11/24/20 0411  NA 142 143 142  K 3.7 3.5 3.1*  CL 105 103 103  CO2 29 28 33*  GLUCOSE 167* 153* 145*  BUN 24* 23 22  CREATININE 0.92 0.91 0.93  CALCIUM 9.2 8.9 8.9  MG 2.2 2.0 2.0  PHOS  --   --  3.1   Liver Function Tests: Recent Labs  Lab 11/22/20 1837 11/23/20 0349 11/24/20 0411  AST 42* 38 39  ALT '18 17 17  '$ ALKPHOS 388* 321* 347*  BILITOT 0.9 1.0 0.9  PROT 5.3* 5.0* 4.8*  ALBUMIN 2.2* 2.1* 2.0*   No results for input(s): LIPASE, AMYLASE in the last 168 hours. No results for input(s): AMMONIA in the last 168 hours. CBC: Recent Labs  Lab 11/22/20 1837 11/23/20 0349  11/24/20 0411  WBC 12.4* 11.0* 9.5  NEUTROABS 10.3*  --  7.3  HGB 9.5* 8.9* 8.4*  HCT 31.6* 29.0* 27.4*  MCV 94.6 93.2 94.2  PLT 247 230 210   Cardiac Enzymes: No results for input(s): CKTOTAL, CKMB, CKMBINDEX, TROPONINI in the last 168 hours. BNP: BNP (last 3 results) Recent Labs    11/22/20 1837  BNP 203.7*    ProBNP (last 3 results) No results for input(s): PROBNP in the last 8760 hours.  CBG: Recent Labs  Lab 11/23/20 1110 11/23/20 1622 11/23/20 2255 11/24/20 0738 11/24/20 1122  GLUCAP 146* 149* 171* 148* 167*       Signed:  Irine Seal MD.  [facility] 11/24/2020, 3:06 PM

## 2020-11-24 NOTE — Progress Notes (Signed)
Initial Nutrition Assessment  DOCUMENTATION CODES:  Not applicable  INTERVENTION:  Recommend regular diet, encourage PO intake If pt remains inpatient, Ensure Enlive po TID, each supplement provides 350 kcal and 20 grams of protein If pt remains inpatient, recommend Juven BID, each packet provides 95 calories, 2.5 grams of protein (collagen), and 9.8 grams of carbohydrate (3 grams sugar); also contains 7 grams of L-arginine and L-glutamine, 300 mg vitamin C, 15 mg vitamin E, 1.2 mcg vitamin B-12, 9.5 mg zinc, 200 mg calcium, and 1.5 g  Calcium Beta-hydroxy-Beta-methylbutyrate to support wound healing  NUTRITION DIAGNOSIS:  Inadequate oral intake related to poor appetite as evidenced by meal completion < 25%.  GOAL:  Patient will meet greater than or equal to 90% of their needs  MONITOR:  PO intake, Supplement acceptance  REASON FOR ASSESSMENT:  Malnutrition Screening Tool    ASSESSMENT:  80 y.o. female with medical history of stage IV colon cancer (not a candidate for further treatment), COPD, GERD, Hx of CVA, HLD, HTN, type 2 DM, and pressure ulcer, brought to ED from home by daughter with increased fatigue, swelling, and worsening wounds.  Pt recently admitted 2 weeks ago with a GI bleed. Noted that pt did not tolerate cancer treatments and was told that she was not a candidate for further interventions. Hospice was recommended by Oncology team.   Pt found to have anasarca and bilateral pleural effusions on imaging in ED. Given lasix to attempt dieresis. Cardiology feels they are related to malignancy and have no further options for treatment. Lasix discontinued. Cardiology recommends hospice care.  Palliative care consulted and discussed [place] with daughter today who wishes for pt to be kept a full code and reports pt still has good quality of life and wants to treat the treatable. Being followed by palliative services outpatient.  Stage 2 sacral wound present on admission, pt is  bedbound.   Attempted to call pt on room phone, no answer at this time. Noted that CM recently placed a note saying pt to be dc home this afternoon.   Did review intake this admission and weight hx. Intake is poor (<25% average). RN reports pt stats appetite is "up and down." Daughter reported on admission that pt does sleep the majority of the day at home. Weight appears higher than recent measurement but significant edema is present and likely the cause.   If pt remains inpatient, would recommend liberalizing diet to regular and adding nutrition supplements TID. Would also recommend Juven BID to promote wound healing.   Average Meal Intake: 8/1-8/3: 23% intake x 3 recorded meals  Nutritionally Relevant Medications: Scheduled Meds:  insulin aspart  0-6 Units Subcutaneous TID WC   pantoprazole  40 mg Oral BID   polyethylene glycol  17 g Oral Daily   [START ON 11/25/2020] potassium chloride SA  20 mEq Oral Daily   PRN Meds: meclizine, ondansetron, senna-docusate  Labs Reviewed: K 3.1  NUTRITION - FOCUSED PHYSICAL EXAM: Defer to in-person assessment  Diet Order:   Diet Order             Diet - low sodium heart healthy           DIET SOFT Room service appropriate? Yes; Fluid consistency: Thin  Diet effective now                   EDUCATION NEEDS:  No education needs have been identified at this time  Skin:  Skin Assessment: Skin Integrity Issues: Skin Integrity  Issues:: Stage II Stage II: coccyx  Last BM:  8/2 - type 7  Height:  Ht Readings from Last 1 Encounters:  11/23/20 '5\' 11"'$  (1.803 m)    Weight:  Wt Readings from Last 1 Encounters:  11/24/20 93.4 kg    Ideal Body Weight:  70.5 kg  BMI:  Body mass index is 28.72 kg/m.  Estimated Nutritional Needs:  Kcal:  1800-2000 kcal Protein:  90-100 g/d Fluid:  1.8-2L/d   Ranell Patrick, RD, LDN Clinical Dietitian Pager on [place]

## 2020-11-24 NOTE — Consult Note (Signed)
[facility] Inpatient Consult   11/24/2020  Tonya [name] 15-Jul-1940 [id number]  [place] [facility] [ACO] Patient: [facility]   Patient chart has been reviewed for readmissions less than 30 days and for high risk score for unplanned readmissions.  Patient assessed for community [facility] follow up needs.    Patient for home with [facility] and [facility] following for support. Spoke with inpatient RNCM and states no [facility] CM needs at this time.  Netta Cedars, MSN, [facility] Liaison Nurse Mobile Phone [phone]  Toll free office [phone]

## 2020-11-24 NOTE — TOC Transition Note (Signed)
Transition of Care [facility]) - CM/SW Discharge Note   Patient Details  Name: Tonya [name] MRN: [id number] Date of Birth: Aug 06, 1940  [facility]) CM/SW Contact:  Dessa Phi, RN Phone Number: 11/24/2020, 1:27 PM   Clinical Narrative: Damaris Schooner to dtr [name] about d/c plans-d/c home w/[facility] rep Tommi Rumps accepted. Otpt [facility] care services rep [place] following. Provided w/[facility] info brochure. PTAR for transport home-called for 3p pick up. No further CM needs.     Final next level of care: [place] Barriers to Discharge: No Barriers Identified   Patient Goals and CMS Choice Patient states their goals for this hospitalization and ongoing recovery are:: go home CMS Medicare.gov Compare Post Acute Care list provided to:: Patient Represenative (must comment) ([name] dtr [phone]) Choice offered to / list presented to : Adult Children  Discharge Placement                       Discharge Plan and Services     Post Acute Care Choice: Durable Medical Equipment                    HH Arranged: RN, PT, OT, Nurse's Aide, Social Work [employer] Agency: [place] Date [facility] Agency Contacted: 11/24/20 Time [place]: 1326 Representative spoke with at [place]: [place] ([place]) Interventions     Readmission Risk Interventions No flowsheet data found.

## 2020-11-24 NOTE — Plan of Care (Signed)
  Problem: Clinical Measurements: Goal: Will remain free from infection Outcome: Progressing Goal: Respiratory complications will improve Outcome: Progressing   Problem: Elimination: Goal: Will not experience complications related to bowel motility Outcome: Progressing

## 2020-11-24 NOTE — TOC Transition Note (Addendum)
Transition of Care [facility]) - CM/SW Discharge Note   Patient Details  Name: Tonya [name] MRN: [id number] Date of Birth: Feb 14, 1941  Transition of Care [facility]) CM/SW Contact:  Dessa Phi, RN Phone Number: 11/24/2020, 3:13 PM   Clinical Narrative: See prior note for for additional info. Ordered for overlay gel mattress-rep Zach with [employer] following for delivery. No further CM needs.  [facility] rep Zach aware of hoyer lift order to deliver to home. Dme does not have to get into home prior d/c. No further CM needs.     Final next level of care: [place] Barriers to Discharge: No Barriers Identified   Patient Goals and CMS Choice Patient states their goals for this hospitalization and ongoing recovery are:: go home CMS Medicare.gov Compare Post Acute Care list provided to:: Patient Represenative (must comment) ([name] dtr [phone]) Choice offered to / list presented to : Adult Children  Discharge Placement                       Discharge Plan and Services     Post Acute Care Choice: Durable Medical Equipment          DME Arranged: Gel overlay DME Agency: [employer] Date DME Agency Contacted: 11/24/20 Time DME Agency Contacted: [phone] Representative spoke with at DME Agency: Thedore Mins HH Arranged: RN, PT, OT, Nurse's Aide, Social Work [employer] Agency: [place] Date [place]: 11/24/20 Time [place]: 1326 Representative spoke with at [place]: [place] [facility] ([place]) Interventions     Readmission Risk Interventions No flowsheet data found.

## 2020-11-24 NOTE — Consult Note (Signed)
Consultation Note Date: 11/24/2020   Patient Name: Tonya [name]  DOB: Nov 17, 1940  MRN: [id number]  Age / Sex: 80 y.o., female  PCP: Leeroy Cha, MD Referring Physician: Eugenie Filler, MD  Reason for Consultation: Establishing goals of care  HPI/Patient Profile: 80 y.o. female  admitted on 11/22/2020    Clinical Assessment and Goals of Care: 80 year old lady, known to palliative medicine service, seen in previous hospitalization.  Patient lives at home with family, history of stage IV adenocarcinoma of the colon, history of DVTs with IVC filter no longer on Eliquis after severe GI bleed in previous hospitalization.  Also has hypertension diabetes pressure ulcer.  Patient presented with fatigue swelling worsening pressure ulcer and dysuria.  Brought in by her daughter.  Patient remains admitted to hospital medicine service for anasarca in the setting of hypoalbuminemia and cancer, chronic diastolic congestive heart failure, pleural effusions.  Palliative medicine team consulted for goals of care discussions. Patient is resting in bed.  She appears with fatigue and generalized weakness.  She is asleep.  Daughter [name] who is healthcare power of attorney agent is present at bedside.  Patient ate about 30% of her breakfast.  Patient's daughter [name] states that the patient's baseline is such that she is mostly in bed and she sleeps for most of the day.  However, daughter states that patient has a quality of life that is worth maintaining and preserving because the patient still eats wakes up to eat and talk and smiles and recognizes family members.  Patient's daughter states that the patient's nickname is " mother nature".  She hopes that the patient will be able to tolerate some efforts at physical therapy and improve her strength and conditioning to some extent upon discharge home.  With regards to [place], we had extensive conversations.  Patient's daughter wishes for the patient to be resuscitated and to undergo mechanical ventilatory support.  It is the patient's previously expressed wishes that if she ends up in an ICU for weeks and months on artificial life support, then, at that time she would not want to live that way and at that point in time her family members might make the decision to withdraw mechanical artificial measures and allow for comfort measures.  However, full code for now.  We discussed extensively about differences between hospice versus palliative approach.  She has had 1 outpatient palliative visit at home and daughter states that it was helpful and she wishes to continue with palliative support at home for now.  HCPOA Daughter [name] is healthcare power of attorney agent who is present at the bedside.  Patient has 5 kids.  SUMMARY OF RECOMMENDATIONS   Goals of care discussions with the patient's daughter [name] who is her healthcare power of attorney agent undertaken.  CODE STATUS discussions undertaken.  We discussed extensively about patient's current condition expected trajectory of decline and possibility of markedly limited prognosis.  Patient's daughter is in full understanding of the patient's overall situation, however, desires continuation of full CODE  STATUS as well as requests for home health PT OT.  She is accepting of continuing with palliative services at home. Thank you for the consult.  Code Status/Advance Care Planning: Full code   Symptom Management:     Palliative Prophylaxis:  Delirium Protocol  Additional Recommendations (Limitations, Scope, Preferences): Full Scope Treatment  Psycho-social/Spiritual:  Desire for further Chaplaincy support:yes Additional Recommendations: Caregiving  Support/Resources  Prognosis:  < 6 months  Discharge Planning: Home with Home Health      Primary Diagnoses: Present on Admission:  Anasarca  Acute  on chronic diastolic CHF (congestive heart failure) (HCC)  Colon cancer metastasized to liver (HCC)  Pressure ulcer  Chronic anemia  Essential hypertension   I have reviewed the medical record, interviewed the patient and family, and examined the patient. The following aspects are pertinent.  Past Medical History:  Diagnosis Date   Cerebrovascular accident [facility]) 2001   left side weakness   Chronic edema    ? venous insufficiency   Colon cancer ([place]) 2020   COPD (chronic obstructive pulmonary disease) ([place])    never had a problem breathing   Diabetes mellitus without complication ([place])    Dyspnea    Enlarged heart    per pt   Gallstones    GERD (gastroesophageal reflux disease)    Goals of care, counseling/discussion 11/01/2018   History of radiation therapy 04/08/2020-05/21/2020   Colon;Dr. Gery Pray   Hyperlipidemia    Hypertension    PONV (postoperative nausea and vomiting)    nausea    Social History   Socioeconomic History   Marital status: Widowed    Spouse name: Not on file   Number of children: 5   Years of education: 9   Highest education level: 9th grade  Occupational History   Occupation: Retired  Tobacco Use   Smoking status: Former    Packs/day: 0.25    Types: Cigarettes    Quit date: 07/23/1968    Years since quitting: 52.3   Smokeless tobacco: Never   Tobacco comments:    6 MONTH  Vaping Use   Vaping Use: Never used  Substance and Sexual Activity   Alcohol use: No    Alcohol/week: 0.0 standard drinks   Drug use: No   Sexual activity: Not Currently  Other Topics Concern   Not on file  Social History Narrative   Not on file   Social Determinants of Health   Financial Resource Strain: Low Risk    Difficulty of Paying Living Expenses: Not hard at all  Food Insecurity: No Food Insecurity   Worried About Charity fundraiser in the Last Year: Never true   [place] in the Last Year: Never true  Transportation Needs: No Transportation  Needs   Lack of Transportation (Medical): No   Lack of Transportation (Non-Medical): No  Physical Activity: Inactive   Days of Exercise per Week: 0 days   Minutes of Exercise per Session: 0 min  Stress: No Stress Concern Present   Feeling of Stress : Not at all  Social Connections: Moderately Isolated   Frequency of Communication with Friends and Family: More than three times a week   Frequency of Social Gatherings with Friends and Family: More than three times a week   Attends Religious Services: More than 4 times per year   Active Member of [employer] or Organizations: No   Attends Archivist Meetings: Never   Marital Status: Widowed   Family History  Problem  Relation Age of Onset   Cirrhosis Mother    Heart disease Father    Cancer Other    Hypertension Other    Diabetes Other    Scheduled Meds:  Chlorhexidine Gluconate Cloth  6 each Topical Daily   insulin aspart  0-6 Units Subcutaneous TID WC   metoprolol succinate  50 mg Oral Daily   pantoprazole  40 mg Oral BID   polyethylene glycol  17 g Oral Daily   [START ON 11/25/2020] potassium chloride SA  20 mEq Oral Daily   potassium chloride  40 mEq Oral Q4H   sodium chloride flush  10-40 mL Intracatheter Q12H   Continuous Infusions: PRN Meds:.acetaminophen **OR** acetaminophen, meclizine, melatonin, ondansetron **OR** ondansetron (ZOFRAN) IV, senna-docusate, sodium chloride flush, traMADol Medications Prior to Admission:  Prior to Admission medications   Medication Sig Start Date End Date Taking? Authorizing Provider  acetaminophen (TYLENOL) 500 MG tablet Take 500 mg by mouth every 6 (six) hours as needed for moderate pain.   Yes [provider]  Alcohol Swabs (B-D SINGLE USE SWABS REGULAR) PADS 1 each by Does not apply route as needed (to cleanse site prior to obtaining droplet of blood for CBG's). E11.9 07/08/19  Yes Renato Shin, MD  B-D ULTRAFINE III SHORT PEN 31G X 8 MM MISC  09/01/19  Yes [provider]  ciprofloxacin (CIPRO) 250 MG tablet Take 1 tablet by mouth every 12 (twelve) hours. For 5 days started 07/29 11/19/20  Yes [provider]  famotidine (PEPCID) 40 MG tablet Take 40 mg by mouth 2 (two) times daily.   Yes [provider]  lidocaine-prilocaine (EMLA) cream Apply 1 application topically as needed for up to 30 doses. 09/01/20  Yes Ennever, Rudell Cobb, MD  meclizine (ANTIVERT) 12.5 MG tablet TAKE 1 TABLET BY MOUTH THREE TIMES DAILY AS NEEDED FOR DIZZINESS 09/27/20  Yes Ennever, Rudell Cobb, MD  Melatonin 10 MG TABS Take 1 tablet by mouth at bedtime as needed (sleep).   Yes [provider]  metoprolol succinate (TOPROL-XL) 25 MG 24 hr tablet 25 mg daily. 01/02/20  Yes [provider]  ondansetron (ZOFRAN) 8 MG tablet TAKE 1 TABLET(8 MG) BY MOUTH EVERY 8 HOURS AS NEEDED FOR NAUSEA OR VOMITING 10/15/20  Yes Ennever, Rudell Cobb, MD  pantoprazole (PROTONIX) 40 MG tablet Take 1 tablet (40 mg total) by mouth 2 (two) times daily. 11/08/20 12/08/20 Yes Ghimire, Dante Gang, MD  potassium chloride SA (KLOR-CON) 20 MEQ tablet Take 20 mEq by mouth daily. 11/10/20  Yes [provider]  torsemide (DEMADEX) 20 MG tablet Take 1 tablet (20 mg total) by mouth daily. 11/08/20 03/08/21 Yes Ghimire, Dante Gang, MD  traMADol (ULTRAM) 50 MG tablet TAKE 1 TABLET(50 MG) BY MOUTH EVERY 6 HOURS AS NEEDED 08/31/20  Yes Ennever, Rudell Cobb, MD  TRUE METRIX BLOOD GLUCOSE TEST test strip TEST BLOOD SUGAR TWICE DAILY 01/16/20  Yes Renato Shin, MD  TRUEplus Lancets 30G MISC 1 each by Does not apply route 2 (two) times daily. E11.9 07/08/19  Yes Renato Shin, MD  Wound Dressings (MEPILEX BORDER FLEX LITE) PADS Apply 1 each topically daily. 11/08/20  Yes Ghimire, Dante Gang, MD  insulin degludec (TRESIBA FLEXTOUCH) 100 UNIT/ML FlexTouch Pen Inject 0.21 mLs (21 Units total) into the skin daily. Take 34 units on chemo days Patient not taking: Reported on 11/23/2020 09/19/19   Renato Shin, MD   Allergies   Allergen Reactions   Dilaudid [Hydromorphone]     unconscious   Hydrocodone Swelling  and Other (See Comments)    "I started swelling, became red, and passed out"   Iohexol Hives, Shortness Of Breath and Other (See Comments)    Patient developed hives and fullness in throat post injection of 125cc's Omni 300, Onset Date: 11/15/2006    Morphine And Related Other (See Comments)    Unconscious, can't talk for 2 days   Naproxen Other (See Comments)    "It made me feel out of my head"   Other Other (See Comments)   Ibuprofen Nausea Only   Propoxyphene N-Acetaminophen Other (See Comments)    "I couldn't find the door to make my way out of the room- I was in misery"   Review of Systems Patient does not verbalize, noted to be resting in bed at the time of my visit. Physical Exam Appears weak, appears chronically ill, resting in bed.  Appears with generalized fatigue Shallow regular breath sounds S1-S2 Has edema Has unlabored breathing, no accessory muscle use  Vital Signs: BP 117/72 (BP Location: Right Wrist)   Pulse 82   Temp 97.7 F (36.5 C)   Resp (!) 23   Ht '5\' 11"'$  (1.803 m)   Wt 93.4 kg   SpO2 98%   BMI 28.72 kg/m  Pain Scale: 0-10   Pain Score: 3    SpO2: SpO2: 98 % O2 Device:SpO2: 98 % O2 Flow Rate: .   IO: Intake/output summary:  Intake/Output Summary (Last 24 hours) at 11/24/2020 1223 Last data filed at 11/24/2020 1000 Gross per 24 hour  Intake 360 ml  Output 203 ml  Net 157 ml    LBM: Last BM Date: 11/21/20 Baseline Weight: Weight: 77.1 kg Most recent weight: Weight: 93.4 kg     Palliative Assessment/Data:   PPS 30%  Time In:  11.20 Time Out:  12.20 Time Total:  60 min.  Greater than 50%  of this time was spent counseling and coordinating care related to the above assessment and plan.  Signed by: Loistine Chance, MD   Please contact Palliative Medicine Team phone at [phone] for questions and concerns.  For individual provider: See  Shea Evans

## 2020-11-25 NOTE — Progress Notes (Signed)
Patient left the hospital stable with PTAR and patient's family member to be discharged home with palliative care. Patient's discharge information and instructions were completed during day shift.

## 2020-11-25 NOTE — Plan of Care (Signed)

## 2020-11-26 DIAGNOSIS — L89152 Pressure ulcer of sacral region, stage 2: Secondary | ICD-10-CM | POA: Diagnosis not present

## 2020-11-26 DIAGNOSIS — I081 Rheumatic disorders of both mitral and tricuspid valves: Secondary | ICD-10-CM | POA: Diagnosis not present

## 2020-11-26 DIAGNOSIS — I69359 Hemiplegia and hemiparesis following cerebral infarction affecting unspecified side: Secondary | ICD-10-CM | POA: Diagnosis not present

## 2020-11-26 DIAGNOSIS — C787 Secondary malignant neoplasm of liver and intrahepatic bile duct: Secondary | ICD-10-CM | POA: Diagnosis not present

## 2020-11-26 DIAGNOSIS — C189 Malignant neoplasm of colon, unspecified: Secondary | ICD-10-CM | POA: Diagnosis not present

## 2020-11-26 DIAGNOSIS — E119 Type 2 diabetes mellitus without complications: Secondary | ICD-10-CM | POA: Diagnosis not present

## 2020-11-26 DIAGNOSIS — I5033 Acute on chronic diastolic (congestive) heart failure: Secondary | ICD-10-CM | POA: Diagnosis not present

## 2020-11-26 DIAGNOSIS — D630 Anemia in neoplastic disease: Secondary | ICD-10-CM | POA: Diagnosis not present

## 2020-11-26 DIAGNOSIS — I110 Hypertensive heart disease with heart failure: Secondary | ICD-10-CM | POA: Diagnosis not present

## 2020-11-29 DIAGNOSIS — D630 Anemia in neoplastic disease: Secondary | ICD-10-CM | POA: Diagnosis not present

## 2020-11-29 DIAGNOSIS — E119 Type 2 diabetes mellitus without complications: Secondary | ICD-10-CM | POA: Diagnosis not present

## 2020-11-29 DIAGNOSIS — I110 Hypertensive heart disease with heart failure: Secondary | ICD-10-CM | POA: Diagnosis not present

## 2020-11-29 DIAGNOSIS — C787 Secondary malignant neoplasm of liver and intrahepatic bile duct: Secondary | ICD-10-CM | POA: Diagnosis not present

## 2020-11-29 DIAGNOSIS — C189 Malignant neoplasm of colon, unspecified: Secondary | ICD-10-CM | POA: Diagnosis not present

## 2020-11-29 DIAGNOSIS — I5033 Acute on chronic diastolic (congestive) heart failure: Secondary | ICD-10-CM | POA: Diagnosis not present

## 2020-11-29 DIAGNOSIS — I081 Rheumatic disorders of both mitral and tricuspid valves: Secondary | ICD-10-CM | POA: Diagnosis not present

## 2020-11-29 DIAGNOSIS — J449 Chronic obstructive pulmonary disease, unspecified: Secondary | ICD-10-CM | POA: Diagnosis not present

## 2020-11-29 DIAGNOSIS — I69359 Hemiplegia and hemiparesis following cerebral infarction affecting unspecified side: Secondary | ICD-10-CM | POA: Diagnosis not present

## 2020-11-29 DIAGNOSIS — L89152 Pressure ulcer of sacral region, stage 2: Secondary | ICD-10-CM | POA: Diagnosis not present

## 2020-11-30 DIAGNOSIS — D630 Anemia in neoplastic disease: Secondary | ICD-10-CM | POA: Diagnosis not present

## 2020-11-30 DIAGNOSIS — I081 Rheumatic disorders of both mitral and tricuspid valves: Secondary | ICD-10-CM | POA: Diagnosis not present

## 2020-11-30 DIAGNOSIS — I5033 Acute on chronic diastolic (congestive) heart failure: Secondary | ICD-10-CM | POA: Diagnosis not present

## 2020-11-30 DIAGNOSIS — J90 Pleural effusion, not elsewhere classified: Secondary | ICD-10-CM | POA: Diagnosis not present

## 2020-11-30 DIAGNOSIS — R601 Generalized edema: Secondary | ICD-10-CM | POA: Diagnosis not present

## 2020-11-30 DIAGNOSIS — I69359 Hemiplegia and hemiparesis following cerebral infarction affecting unspecified side: Secondary | ICD-10-CM | POA: Diagnosis not present

## 2020-11-30 DIAGNOSIS — I110 Hypertensive heart disease with heart failure: Secondary | ICD-10-CM | POA: Diagnosis not present

## 2020-11-30 DIAGNOSIS — C787 Secondary malignant neoplasm of liver and intrahepatic bile duct: Secondary | ICD-10-CM | POA: Diagnosis not present

## 2020-11-30 DIAGNOSIS — E119 Type 2 diabetes mellitus without complications: Secondary | ICD-10-CM | POA: Diagnosis not present

## 2020-11-30 DIAGNOSIS — C189 Malignant neoplasm of colon, unspecified: Secondary | ICD-10-CM | POA: Diagnosis not present

## 2020-11-30 DIAGNOSIS — L89152 Pressure ulcer of sacral region, stage 2: Secondary | ICD-10-CM | POA: Diagnosis not present

## 2020-12-01 DIAGNOSIS — C787 Secondary malignant neoplasm of liver and intrahepatic bile duct: Secondary | ICD-10-CM | POA: Diagnosis not present

## 2020-12-01 DIAGNOSIS — C189 Malignant neoplasm of colon, unspecified: Secondary | ICD-10-CM | POA: Diagnosis not present

## 2020-12-01 DIAGNOSIS — E119 Type 2 diabetes mellitus without complications: Secondary | ICD-10-CM | POA: Diagnosis not present

## 2020-12-01 DIAGNOSIS — I081 Rheumatic disorders of both mitral and tricuspid valves: Secondary | ICD-10-CM | POA: Diagnosis not present

## 2020-12-01 DIAGNOSIS — I69354 Hemiplegia and hemiparesis following cerebral infarction affecting left non-dominant side: Secondary | ICD-10-CM | POA: Diagnosis not present

## 2020-12-01 DIAGNOSIS — I5033 Acute on chronic diastolic (congestive) heart failure: Secondary | ICD-10-CM | POA: Diagnosis not present

## 2020-12-01 DIAGNOSIS — I110 Hypertensive heart disease with heart failure: Secondary | ICD-10-CM | POA: Diagnosis not present

## 2020-12-01 DIAGNOSIS — L89152 Pressure ulcer of sacral region, stage 2: Secondary | ICD-10-CM | POA: Diagnosis not present

## 2020-12-01 DIAGNOSIS — D630 Anemia in neoplastic disease: Secondary | ICD-10-CM | POA: Diagnosis not present

## 2020-12-01 DIAGNOSIS — I69359 Hemiplegia and hemiparesis following cerebral infarction affecting unspecified side: Secondary | ICD-10-CM | POA: Diagnosis not present

## 2020-12-02 ENCOUNTER — Telehealth: Payer: Self-pay | Admitting: Cardiology

## 2020-12-02 DIAGNOSIS — I110 Hypertensive heart disease with heart failure: Secondary | ICD-10-CM | POA: Diagnosis not present

## 2020-12-02 DIAGNOSIS — D630 Anemia in neoplastic disease: Secondary | ICD-10-CM | POA: Diagnosis not present

## 2020-12-02 DIAGNOSIS — I5033 Acute on chronic diastolic (congestive) heart failure: Secondary | ICD-10-CM | POA: Diagnosis not present

## 2020-12-02 DIAGNOSIS — E119 Type 2 diabetes mellitus without complications: Secondary | ICD-10-CM | POA: Diagnosis not present

## 2020-12-02 DIAGNOSIS — I081 Rheumatic disorders of both mitral and tricuspid valves: Secondary | ICD-10-CM | POA: Diagnosis not present

## 2020-12-02 DIAGNOSIS — C787 Secondary malignant neoplasm of liver and intrahepatic bile duct: Secondary | ICD-10-CM | POA: Diagnosis not present

## 2020-12-02 DIAGNOSIS — L89152 Pressure ulcer of sacral region, stage 2: Secondary | ICD-10-CM | POA: Diagnosis not present

## 2020-12-02 DIAGNOSIS — I69359 Hemiplegia and hemiparesis following cerebral infarction affecting unspecified side: Secondary | ICD-10-CM | POA: Diagnosis not present

## 2020-12-02 DIAGNOSIS — C189 Malignant neoplasm of colon, unspecified: Secondary | ICD-10-CM | POA: Diagnosis not present

## 2020-12-02 NOTE — Telephone Encounter (Signed)
Pt's daughter called wanting to know when Pt should go back on eliquis & another medication (did not catch the name of it). Please notify her about when Pt can go back on these meds.

## 2020-12-03 DIAGNOSIS — D630 Anemia in neoplastic disease: Secondary | ICD-10-CM | POA: Diagnosis not present

## 2020-12-03 DIAGNOSIS — I081 Rheumatic disorders of both mitral and tricuspid valves: Secondary | ICD-10-CM | POA: Diagnosis not present

## 2020-12-03 DIAGNOSIS — C787 Secondary malignant neoplasm of liver and intrahepatic bile duct: Secondary | ICD-10-CM | POA: Diagnosis not present

## 2020-12-03 DIAGNOSIS — C189 Malignant neoplasm of colon, unspecified: Secondary | ICD-10-CM | POA: Diagnosis not present

## 2020-12-03 DIAGNOSIS — L89152 Pressure ulcer of sacral region, stage 2: Secondary | ICD-10-CM | POA: Diagnosis not present

## 2020-12-03 DIAGNOSIS — E119 Type 2 diabetes mellitus without complications: Secondary | ICD-10-CM | POA: Diagnosis not present

## 2020-12-03 DIAGNOSIS — I110 Hypertensive heart disease with heart failure: Secondary | ICD-10-CM | POA: Diagnosis not present

## 2020-12-03 DIAGNOSIS — I69359 Hemiplegia and hemiparesis following cerebral infarction affecting unspecified side: Secondary | ICD-10-CM | POA: Diagnosis not present

## 2020-12-03 DIAGNOSIS — I5033 Acute on chronic diastolic (congestive) heart failure: Secondary | ICD-10-CM | POA: Diagnosis not present

## 2020-12-06 DIAGNOSIS — C787 Secondary malignant neoplasm of liver and intrahepatic bile duct: Secondary | ICD-10-CM | POA: Diagnosis not present

## 2020-12-06 DIAGNOSIS — I69359 Hemiplegia and hemiparesis following cerebral infarction affecting unspecified side: Secondary | ICD-10-CM | POA: Diagnosis not present

## 2020-12-06 DIAGNOSIS — D630 Anemia in neoplastic disease: Secondary | ICD-10-CM | POA: Diagnosis not present

## 2020-12-06 DIAGNOSIS — I081 Rheumatic disorders of both mitral and tricuspid valves: Secondary | ICD-10-CM | POA: Diagnosis not present

## 2020-12-06 DIAGNOSIS — C189 Malignant neoplasm of colon, unspecified: Secondary | ICD-10-CM | POA: Diagnosis not present

## 2020-12-06 DIAGNOSIS — I5033 Acute on chronic diastolic (congestive) heart failure: Secondary | ICD-10-CM | POA: Diagnosis not present

## 2020-12-06 DIAGNOSIS — E119 Type 2 diabetes mellitus without complications: Secondary | ICD-10-CM | POA: Diagnosis not present

## 2020-12-06 DIAGNOSIS — L89152 Pressure ulcer of sacral region, stage 2: Secondary | ICD-10-CM | POA: Diagnosis not present

## 2020-12-06 DIAGNOSIS — I110 Hypertensive heart disease with heart failure: Secondary | ICD-10-CM | POA: Diagnosis not present

## 2020-12-07 DIAGNOSIS — L89152 Pressure ulcer of sacral region, stage 2: Secondary | ICD-10-CM | POA: Diagnosis not present

## 2020-12-07 DIAGNOSIS — I110 Hypertensive heart disease with heart failure: Secondary | ICD-10-CM | POA: Diagnosis not present

## 2020-12-07 DIAGNOSIS — C787 Secondary malignant neoplasm of liver and intrahepatic bile duct: Secondary | ICD-10-CM | POA: Diagnosis not present

## 2020-12-07 DIAGNOSIS — I081 Rheumatic disorders of both mitral and tricuspid valves: Secondary | ICD-10-CM | POA: Diagnosis not present

## 2020-12-07 DIAGNOSIS — D630 Anemia in neoplastic disease: Secondary | ICD-10-CM | POA: Diagnosis not present

## 2020-12-07 DIAGNOSIS — E119 Type 2 diabetes mellitus without complications: Secondary | ICD-10-CM | POA: Diagnosis not present

## 2020-12-07 DIAGNOSIS — C189 Malignant neoplasm of colon, unspecified: Secondary | ICD-10-CM | POA: Diagnosis not present

## 2020-12-07 DIAGNOSIS — I5033 Acute on chronic diastolic (congestive) heart failure: Secondary | ICD-10-CM | POA: Diagnosis not present

## 2020-12-07 DIAGNOSIS — I69359 Hemiplegia and hemiparesis following cerebral infarction affecting unspecified side: Secondary | ICD-10-CM | POA: Diagnosis not present

## 2020-12-09 ENCOUNTER — Telehealth: Payer: Self-pay

## 2020-12-09 ENCOUNTER — Emergency Department (HOSPITAL_COMMUNITY): Payer: Medicare HMO

## 2020-12-09 ENCOUNTER — Emergency Department (HOSPITAL_COMMUNITY)
Admission: EM | Admit: 2020-12-09 | Discharge: 2020-12-09 | Disposition: A | Discharge status: Home / Self Care | Payer: Medicare HMO | Attending: Emergency Medicine | Admitting: Emergency Medicine

## 2020-12-09 ENCOUNTER — Other Ambulatory Visit: Payer: Self-pay

## 2020-12-09 DIAGNOSIS — R0602 Shortness of breath: Secondary | ICD-10-CM | POA: Diagnosis not present

## 2020-12-09 DIAGNOSIS — Z87891 Personal history of nicotine dependence: Secondary | ICD-10-CM | POA: Insufficient documentation

## 2020-12-09 DIAGNOSIS — R279 Unspecified lack of coordination: Secondary | ICD-10-CM | POA: Diagnosis not present

## 2020-12-09 DIAGNOSIS — Z20822 Contact with and (suspected) exposure to covid-19: Secondary | ICD-10-CM | POA: Insufficient documentation

## 2020-12-09 DIAGNOSIS — I110 Hypertensive heart disease with heart failure: Secondary | ICD-10-CM | POA: Insufficient documentation

## 2020-12-09 DIAGNOSIS — Z743 Need for continuous supervision: Secondary | ICD-10-CM | POA: Diagnosis not present

## 2020-12-09 DIAGNOSIS — I5033 Acute on chronic diastolic (congestive) heart failure: Secondary | ICD-10-CM | POA: Insufficient documentation

## 2020-12-09 DIAGNOSIS — E119 Type 2 diabetes mellitus without complications: Secondary | ICD-10-CM | POA: Diagnosis not present

## 2020-12-09 DIAGNOSIS — D734 Cyst of spleen: Secondary | ICD-10-CM | POA: Diagnosis not present

## 2020-12-09 DIAGNOSIS — R5381 Other malaise: Secondary | ICD-10-CM | POA: Diagnosis not present

## 2020-12-09 DIAGNOSIS — Z794 Long term (current) use of insulin: Secondary | ICD-10-CM | POA: Insufficient documentation

## 2020-12-09 DIAGNOSIS — C799 Secondary malignant neoplasm of unspecified site: Secondary | ICD-10-CM | POA: Diagnosis not present

## 2020-12-09 DIAGNOSIS — K7689 Other specified diseases of liver: Secondary | ICD-10-CM | POA: Diagnosis not present

## 2020-12-09 DIAGNOSIS — I959 Hypotension, unspecified: Secondary | ICD-10-CM | POA: Diagnosis not present

## 2020-12-09 DIAGNOSIS — R079 Chest pain, unspecified: Secondary | ICD-10-CM | POA: Diagnosis not present

## 2020-12-09 DIAGNOSIS — Z79899 Other long term (current) drug therapy: Secondary | ICD-10-CM | POA: Diagnosis not present

## 2020-12-09 DIAGNOSIS — I700 Atherosclerosis of aorta: Secondary | ICD-10-CM | POA: Diagnosis not present

## 2020-12-09 DIAGNOSIS — R531 Weakness: Secondary | ICD-10-CM | POA: Diagnosis not present

## 2020-12-09 DIAGNOSIS — R1084 Generalized abdominal pain: Secondary | ICD-10-CM

## 2020-12-09 DIAGNOSIS — I10 Essential (primary) hypertension: Secondary | ICD-10-CM | POA: Diagnosis not present

## 2020-12-09 DIAGNOSIS — R110 Nausea: Secondary | ICD-10-CM | POA: Diagnosis not present

## 2020-12-09 DIAGNOSIS — J9811 Atelectasis: Secondary | ICD-10-CM | POA: Diagnosis not present

## 2020-12-09 DIAGNOSIS — R109 Unspecified abdominal pain: Secondary | ICD-10-CM | POA: Diagnosis not present

## 2020-12-09 DIAGNOSIS — R0902 Hypoxemia: Secondary | ICD-10-CM | POA: Diagnosis not present

## 2020-12-09 DIAGNOSIS — Z85038 Personal history of other malignant neoplasm of large intestine: Secondary | ICD-10-CM | POA: Insufficient documentation

## 2020-12-09 DIAGNOSIS — J449 Chronic obstructive pulmonary disease, unspecified: Secondary | ICD-10-CM | POA: Insufficient documentation

## 2020-12-09 DIAGNOSIS — J90 Pleural effusion, not elsewhere classified: Secondary | ICD-10-CM | POA: Diagnosis not present

## 2020-12-09 LAB — CBC WITH DIFFERENTIAL/PLATELET
Abs Immature Granulocytes: 0.07 10*3/uL (ref 0.00–0.07)
Basophils Absolute: 0 10*3/uL (ref 0.0–0.1)
Basophils Relative: 0 %
Eosinophils Absolute: 0 10*3/uL (ref 0.0–0.5)
Eosinophils Relative: 0 %
HCT: 32.5 % — ABNORMAL LOW (ref 36.0–46.0)
Hemoglobin: 10.4 g/dL — ABNORMAL LOW (ref 12.0–15.0)
Immature Granulocytes: 1 %
Lymphocytes Relative: 2 %
Lymphs Abs: 0.3 10*3/uL — ABNORMAL LOW (ref 0.7–4.0)
MCH: 28.3 pg (ref 26.0–34.0)
MCHC: 32 g/dL (ref 30.0–36.0)
MCV: 88.6 fL (ref 80.0–100.0)
Monocytes Absolute: 1.5 10*3/uL — ABNORMAL HIGH (ref 0.1–1.0)
Monocytes Relative: 10 %
Neutro Abs: 12.8 10*3/uL — ABNORMAL HIGH (ref 1.7–7.7)
Neutrophils Relative %: 87 %
Platelets: 243 10*3/uL (ref 150–400)
RBC: 3.67 MIL/uL — ABNORMAL LOW (ref 3.87–5.11)
RDW: 17 % — ABNORMAL HIGH (ref 11.5–15.5)
WBC: 14.7 10*3/uL — ABNORMAL HIGH (ref 4.0–10.5)
nRBC: 0 % (ref 0.0–0.2)

## 2020-12-09 LAB — COMPREHENSIVE METABOLIC PANEL
ALT: 19 U/L (ref 0–44)
AST: 37 U/L (ref 15–41)
Albumin: 2.1 g/dL — ABNORMAL LOW (ref 3.5–5.0)
Alkaline Phosphatase: 417 U/L — ABNORMAL HIGH (ref 38–126)
Anion gap: 11 (ref 5–15)
BUN: 35 mg/dL — ABNORMAL HIGH (ref 8–23)
CO2: 23 mmol/L (ref 22–32)
Calcium: 9.1 mg/dL (ref 8.9–10.3)
Chloride: 105 mmol/L (ref 98–111)
Creatinine, Ser: 1.32 mg/dL — ABNORMAL HIGH (ref 0.44–1.00)
GFR, Estimated: 40 mL/min — ABNORMAL LOW (ref >60)
Glucose, Bld: 145 mg/dL — ABNORMAL HIGH (ref 70–99)
Potassium: 3.9 mmol/L (ref 3.5–5.1)
Sodium: 139 mmol/L (ref 135–145)
Total Bilirubin: 1.6 mg/dL — ABNORMAL HIGH (ref 0.3–1.2)
Total Protein: 5.3 g/dL — ABNORMAL LOW (ref 6.5–8.1)

## 2020-12-09 LAB — URINALYSIS, ROUTINE W REFLEX MICROSCOPIC
Bacteria, UA: NONE SEEN
Bilirubin Urine: NEGATIVE
Glucose, UA: NEGATIVE mg/dL
Hgb urine dipstick: NEGATIVE
Ketones, ur: 5 mg/dL — AB
Leukocytes,Ua: NEGATIVE
Nitrite: NEGATIVE
Protein, ur: 30 mg/dL — AB
Specific Gravity, Urine: 1.023 (ref 1.005–1.030)
pH: 5 (ref 5.0–8.0)

## 2020-12-09 LAB — CBG MONITORING, ED: Glucose-Capillary: 130 mg/dL — ABNORMAL HIGH (ref 70–99)

## 2020-12-09 LAB — RESP PANEL BY RT-PCR (FLU A&B, COVID) ARPGX2
Influenza A by PCR: NEGATIVE
Influenza B by PCR: NEGATIVE
SARS Coronavirus 2 by RT PCR: NEGATIVE

## 2020-12-09 LAB — LIPASE, BLOOD: Lipase: 59 U/L — ABNORMAL HIGH (ref 11–51)

## 2020-12-09 MED ORDER — FENTANYL CITRATE PF 50 MCG/ML IJ SOSY
25.0000 ug | PREFILLED_SYRINGE | Freq: Once | INTRAMUSCULAR | Status: AC
  Administered 2020-12-09: 25 ug via INTRAVENOUS
  Filled 2020-12-09: qty 1

## 2020-12-09 MED ORDER — LIDOCAINE 5 % EX PTCH
1.0000 | MEDICATED_PATCH | CUTANEOUS | Status: DC
  Administered 2020-12-09: 1 via TRANSDERMAL
  Filled 2020-12-09: qty 1

## 2020-12-09 MED ORDER — HEPARIN SOD (PORK) LOCK FLUSH 100 UNIT/ML IV SOLN
500.0000 [IU] | Freq: Once | INTRAVENOUS | Status: AC
  Administered 2020-12-09: 500 [IU]
  Filled 2020-12-09: qty 5

## 2020-12-09 MED ORDER — FENTANYL CITRATE PF 50 MCG/ML IJ SOSY
50.0000 ug | PREFILLED_SYRINGE | Freq: Once | INTRAMUSCULAR | Status: AC
Start: 2020-12-09 — End: 2020-12-09
  Administered 2020-12-09: 50 ug via INTRAVENOUS
  Filled 2020-12-09: qty 1

## 2020-12-09 NOTE — Telephone Encounter (Signed)
Phone call placed to PCP, Dr. Fara Olden, to provide update that patient and daughter are requesting for patient to be transitioned to hospice services. VM left with medical assistant, Tia.

## 2020-12-09 NOTE — Discharge Instructions (Addendum)
You may increase your tramadol intake to every 4-5 hours instead of every 6 hours.  She may also take additional half tablet before bedtime.  Call your palliative care/hospice care team today or tomorrow to establish hospice care.  Return to the ER if the patient's pain goals or not met or if you have any additional concerns.

## 2020-12-09 NOTE — ED Notes (Signed)
Pt discharged to home via ambulance  

## 2020-12-09 NOTE — ED Provider Notes (Signed)
[facility] EMERGENCY DEPARTMENT Provider Note   CSN: [id number] Arrival date & time: 12/09/20  [id number]     History Chief Complaint  Patient presents with   Abdominal Pain    Tonya [name] is a 80 y.o. female.  Patient presents to ER with chief complaint of increased abdominal pain.  She has known history of metastatic adenocarcinoma reportedly stage IV.  She is no longer a candidate for chemotherapy or radiation treatment and last had cancer treatment approximately 4 months ago.  Family has been pursuing palliative care at home but states that they only have contact once a month and it appears inadequate for her.  They noted increased abdominal pain over the course the last few days above her baseline and presented to the ER.  They state that she is on tramadol every 6 hours as needed at home.  No reports of fevers or cough no reports of vomiting or diarrhea.      Past Medical History:  Diagnosis Date   Cerebrovascular accident [facility]) 2001   left side weakness   Chronic edema    ? venous insufficiency   Colon cancer ([place]) 2020   COPD (chronic obstructive pulmonary disease) ([place])    never had a problem breathing   Diabetes mellitus without complication ([place])    Dyspnea    Enlarged heart    per pt   Gallstones    GERD (gastroesophageal reflux disease)    Goals of care, counseling/discussion 11/01/2018   History of radiation therapy 04/08/2020-05/21/2020   Colon;Dr. Gery Pray   Hyperlipidemia    Hypertension    PONV (postoperative nausea and vomiting)    nausea     Patient Active Problem List   Diagnosis Date Noted   Anasarca 11/22/2020   Pleural effusion 11/22/2020   Acute on chronic diastolic CHF (congestive heart failure) ([place]) 11/22/2020   Heart failure ([place])    Hypervolemia    Pressure ulcer 11/05/2020   Acute GI bleeding 11/04/2020   Personal history of DVT (deep vein thrombosis) 05/12/2020   NSVT (nonsustained ventricular tachycardia) ([place]) 05/12/2020    Type II diabetes mellitus ([place]) 12/16/2018   Goals of care, counseling/discussion 11/01/2018   Colon cancer metastasized to liver (H[facility])    Chronic anemia 10/15/2018   D-dimer, elevated 10/11/2017   Leg edema, left 10/10/2017   Lesion of lip 10/10/2017   Vitamin D deficiency 07/17/2017   Spinal stenosis of lumbar region 01/23/2017   Frequent PVCs 11/23/2016   Heart murmur 09/19/2016   Iron deficiency anemia due to chronic blood loss 05/17/2016   Chronic right hip pain 06/08/2015   Abnormal EKG 02/23/2015   Bradycardia 10/20/2014   Routine general medical examination at a health care facility 01/02/2013   Hypertonicity of bladder 12/20/2009   Hyperlipidemia with target LDL less than 100 12/14/2008   GERD 11/03/2008   Right lumbar radiculitis 05/18/2008   Essential hypertension 07/26/2007   COPD 07/26/2007   CVA, old, hemiparesis ([place]) 07/26/2007    Past Surgical History:  Procedure Laterality Date   ABDOMINAL HYSTERECTOMY     BIOPSY  10/18/2018   Procedure: BIOPSY;  Surgeon: Jackquline Denmark, MD;  Location: [facility] ENDOSCOPY;  Service: Endoscopy;;   BIOPSY  11/05/2020   Procedure: BIOPSY;  Surgeon: Gatha Mayer, MD;  Location: [facility] ENDOSCOPY;  Service: Endoscopy;;   CHOLECYSTECTOMY     COLONOSCOPY WITH PROPOFOL N/A 10/18/2018   Procedure: COLONOSCOPY WITH PROPOFOL;  Surgeon: Jackquline Denmark, MD;  Location: [facility] ENDOSCOPY;  Service: Endoscopy;  Laterality: N/A;   ESOPHAGOGASTRODUODENOSCOPY (EGD) WITH PROPOFOL N/A 11/05/2020   Procedure: ESOPHAGOGASTRODUODENOSCOPY (EGD) WITH PROPOFOL;  Surgeon: Gatha Mayer, MD;  Location: [facility] ENDOSCOPY;  Service: Endoscopy;  Laterality: N/A;   IR IMAGING GUIDED PORT INSERTION  11/11/2018   IR IVC FILTER PLMT / S&I /IMG GUID/MOD SED  02/11/2020   POLYPECTOMY  10/18/2018   Procedure: POLYPECTOMY;  Surgeon: Jackquline Denmark, MD;  Location: [facility] ENDOSCOPY;  Service: Endoscopy;;   SUBMUCOSAL TATTOO INJECTION  10/18/2018   Procedure: SUBMUCOSAL TATTOO INJECTION;   Surgeon: Jackquline Denmark, MD;  Location: [facility] ENDOSCOPY;  Service: Endoscopy;;     OB History   No obstetric history on file.     Family History  Problem Relation Age of Onset   Cirrhosis Mother    Heart disease Father    Cancer Other    Hypertension Other    Diabetes Other     Social History   Tobacco Use   Smoking status: Former    Packs/day: 0.25    Types: Cigarettes    Quit date: 07/23/1968    Years since quitting: 52.4   Smokeless tobacco: Never   Tobacco comments:    6 MONTH  Vaping Use   Vaping Use: Never used  Substance Use Topics   Alcohol use: No    Alcohol/week: 0.0 standard drinks   Drug use: No    Home Medications Prior to Admission medications   Medication Sig Start Date End Date Taking? Authorizing Provider  acetaminophen (TYLENOL) 500 MG tablet Take 500 mg by mouth every 6 (six) hours as needed for moderate pain.   Yes [provider]  insulin degludec (TRESIBA FLEXTOUCH) 100 UNIT/ML FlexTouch Pen Inject 21 Units into the skin daily. 34u on chemo days   Yes [provider]  lidocaine-prilocaine (EMLA) cream Apply 1 application topically as needed for up to 30 doses. Patient taking differently: Apply 1 application topically as needed (port). 09/01/20  Yes Ennever, Rudell Cobb, MD  meclizine (ANTIVERT) 12.5 MG tablet TAKE 1 TABLET BY MOUTH THREE TIMES DAILY AS NEEDED FOR DIZZINESS Patient taking differently: Take 12.5 mg by mouth 3 (three) times daily as needed for dizziness. 09/27/20  Yes [name], MD  Melatonin 10 MG TABS Take 1 tablet by mouth at bedtime as needed (sleep).   Yes [provider]  metoprolol succinate (TOPROL-XL) 50 MG 24 hr tablet Take 1 tablet (50 mg total) by mouth daily. 11/24/20  Yes Eugenie Filler, MD  ondansetron (ZOFRAN) 8 MG tablet TAKE 1 TABLET(8 MG) BY MOUTH EVERY 8 HOURS AS NEEDED FOR NAUSEA OR VOMITING Patient taking differently: Take 8 mg by mouth every 8 (eight) hours as needed for nausea or  vomiting. 10/15/20  Yes Ennever, Rudell Cobb, MD  pantoprazole (PROTONIX) 40 MG tablet Take 1 tablet (40 mg total) by mouth 2 (two) times daily. 11/08/20 12/09/20 Yes Barb Merino, MD  prochlorperazine (COMPAZINE) 10 MG tablet Take 10 mg by mouth every 6 (six) hours as needed for nausea or vomiting.   Yes [provider]  torsemide (DEMADEX) 20 MG tablet Take 20 mg by mouth daily.   Yes [provider]  traMADol (ULTRAM) 50 MG tablet TAKE 1 TABLET(50 MG) BY MOUTH EVERY 6 HOURS AS NEEDED Patient taking differently: Take 50 mg by mouth every 6 (six) hours as needed for moderate pain. 08/31/20  Yes [name], MD  Alcohol Swabs (B-D SINGLE USE SWABS REGULAR) PADS 1 each by Does not apply route as needed (  to cleanse site prior to obtaining droplet of blood for CBG's). E11.9 07/08/19   Renato Shin, MD  B-D ULTRAFINE III SHORT PEN 31G X 8 MM MISC  09/01/19   [provider]  polyethylene glycol (MIRALAX / GLYCOLAX) 17 g packet Take 17 g by mouth daily. Patient not taking: No sig reported 11/25/20   Eugenie Filler, MD  senna-docusate (SENOKOT-S) 8.6-50 MG tablet Take 1 tablet by mouth at bedtime as needed for mild constipation. Patient not taking: No sig reported 11/24/20   Eugenie Filler, MD  TRUE METRIX BLOOD GLUCOSE TEST test strip TEST BLOOD SUGAR TWICE DAILY 01/16/20   Renato Shin, MD  TRUEplus Lancets 30G MISC 1 each by Does not apply route 2 (two) times daily. E11.9 07/08/19   Renato Shin, MD  Wound Dressings (MEPILEX BORDER FLEX LITE) PADS Apply 1 each topically daily. 11/08/20   Barb Merino, MD    Allergies    Dilaudid [hydromorphone], Hydrocodone, Iohexol, Morphine and related, Naproxen, Other, Ibuprofen, and Propoxyphene n-acetaminophen  Review of Systems   Review of Systems  Constitutional:  Negative for fever.  HENT:  Negative for ear pain.   Eyes:  Negative for pain.  Respiratory:  Negative for cough.   Cardiovascular:  Negative for chest pain.   Gastrointestinal:  Negative for abdominal pain.  Genitourinary:  Negative for flank pain.  Musculoskeletal:  Negative for back pain.  Skin:  Negative for rash.  Neurological:  Negative for headaches.   Physical Exam Updated Vital Signs BP 106/66   Pulse (!) 113   Temp 97.7 F (36.5 C) (Oral)   Resp 15   Ht '5\' 11"'$  (1.803 m)   Wt 90 kg   SpO2 97%   BMI 27.67 kg/m   Physical Exam Constitutional:      General: She is not in acute distress.    Appearance: Normal appearance.  HENT:     Head: Normocephalic.     Nose: Nose normal.  Eyes:     Extraocular Movements: Extraocular movements intact.  Cardiovascular:     Rate and Rhythm: Normal rate.  Pulmonary:     Effort: Pulmonary effort is normal.  Musculoskeletal:        General: Normal range of motion.     Cervical back: Normal range of motion.  Neurological:     General: No focal deficit present.     Mental Status: She is alert. Mental status is at baseline.    ED Results / Procedures / Treatments   Labs (all labs ordered are listed, but only abnormal results are displayed) Labs Reviewed  CBC WITH DIFFERENTIAL/PLATELET - Abnormal; Notable for the following components:      Result Value   WBC 14.7 (*)    RBC 3.67 (*)    Hemoglobin 10.4 (*)    HCT 32.5 (*)    RDW 17.0 (*)    Neutro Abs 12.8 (*)    Lymphs Abs 0.3 (*)    Monocytes Absolute 1.5 (*)    All other components within normal limits  COMPREHENSIVE METABOLIC PANEL - Abnormal; Notable for the following components:   Glucose, Bld 145 (*)    BUN 35 (*)    Creatinine, Ser 1.32 (*)    Total Protein 5.3 (*)    Albumin 2.1 (*)    Alkaline Phosphatase 417 (*)    Total Bilirubin 1.6 (*)    GFR, Estimated 40 (*)    All other components within normal limits  LIPASE, BLOOD - Abnormal;  Notable for the following components:   Lipase 59 (*)    All other components within normal limits  CBG MONITORING, ED - Abnormal; Notable for the following components:    Glucose-Capillary 130 (*)    All other components within normal limits  RESP PANEL BY RT-PCR (FLU A&B, COVID) [id number]  URINALYSIS, ROUTINE W REFLEX MICROSCOPIC    EKG None  Radiology CT ABDOMEN PELVIS WO CONTRAST  Result Date: 12/09/2020 CLINICAL DATA:  Abdominal pain, nausea. Pleural effusion. History of metastatic colon cancer. EXAM: CT CHEST, ABDOMEN AND PELVIS WITHOUT CONTRAST TECHNIQUE: Multidetector CT imaging of the chest, abdomen and pelvis was performed following the standard protocol without IV contrast. COMPARISON:  July 08, 2020. FINDINGS: CT CHEST FINDINGS Cardiovascular: Atherosclerosis of thoracic aorta is noted without aneurysm formation. Normal cardiac size. No pericardial effusion. Right internal jugular Port-A-Cath is noted with distal tip in expected position of cavoatrial junction. Mediastinum/Nodes: No enlarged mediastinal, hilar, or axillary lymph nodes. Thyroid gland, trachea, and esophagus demonstrate no significant findings. Lungs/Pleura: Large bilateral pleural effusions are noted with adjacent atelectasis of the upper and lower lobes. No pneumothorax is noted. Musculoskeletal: No chest wall mass or suspicious bone lesions identified. CT ABDOMEN PELVIS FINDINGS Hepatobiliary: There are again noted multiple rounded low densities involving the right and left hepatic lobes which are enlarged compared to prior exam, consistent with worsening hepatic metastatic disease. Status post cholecystectomy. No biliary dilatation is noted. Pancreas: Unremarkable. No pancreatic ductal dilatation or surrounding inflammatory changes. Spleen: Stable splenic cyst. Adrenals/Urinary Tract: Adrenal glands are unremarkable. Kidneys are normal, without renal calculi, focal lesion, or hydronephrosis. Bladder is unremarkable. Stomach/Bowel: The stomach is unremarkable. There is no evidence of bowel obstruction or inflammation. Vascular/Lymphatic: Aortic atherosclerosis. IVC filter is noted in infrarenal  position. Reproductive: Status post hysterectomy. Other: Mild ascites is noted in the pelvis and both upper quadrants. Mild anasarca is noted. 11.3 x 5.2 cm mass is noted in posterior portion of right pelvis which is enlarged compared to prior exam and concerning for peritoneal implant. Musculoskeletal: No acute or significant osseous findings. IMPRESSION: Large bilateral pleural effusions are noted with adjacent atelectasis of the bilateral upper and lower lobes. Significantly worsened diffuse hepatic metastatic disease is noted compared to prior exam. Mild ascites is noted. 11.3 x 5.2 cm mass is noted in the right and posterior portion of the pelvis which is enlarged compared to prior exam and concerning for peritoneal implant or other metastatic disease. Aortic Atherosclerosis (ICD10-I70.0). Electronically Signed   By: Marijo Conception M.D.   On: 12/09/2020 12:55   CT Chest Wo Contrast  Result Date: 12/09/2020 CLINICAL DATA:  Abdominal pain, nausea. Pleural effusion. History of metastatic colon cancer. EXAM: CT CHEST, ABDOMEN AND PELVIS WITHOUT CONTRAST TECHNIQUE: Multidetector CT imaging of the chest, abdomen and pelvis was performed following the standard protocol without IV contrast. COMPARISON:  July 08, 2020. FINDINGS: CT CHEST FINDINGS Cardiovascular: Atherosclerosis of thoracic aorta is noted without aneurysm formation. Normal cardiac size. No pericardial effusion. Right internal jugular Port-A-Cath is noted with distal tip in expected position of cavoatrial junction. Mediastinum/Nodes: No enlarged mediastinal, hilar, or axillary lymph nodes. Thyroid gland, trachea, and esophagus demonstrate no significant findings. Lungs/Pleura: Large bilateral pleural effusions are noted with adjacent atelectasis of the upper and lower lobes. No pneumothorax is noted. Musculoskeletal: No chest wall mass or suspicious bone lesions identified. CT ABDOMEN PELVIS FINDINGS Hepatobiliary: There are again noted multiple  rounded low densities involving the right and left hepatic lobes which are enlarged  compared to prior exam, consistent with worsening hepatic metastatic disease. Status post cholecystectomy. No biliary dilatation is noted. Pancreas: Unremarkable. No pancreatic ductal dilatation or surrounding inflammatory changes. Spleen: Stable splenic cyst. Adrenals/Urinary Tract: Adrenal glands are unremarkable. Kidneys are normal, without renal calculi, focal lesion, or hydronephrosis. Bladder is unremarkable. Stomach/Bowel: The stomach is unremarkable. There is no evidence of bowel obstruction or inflammation. Vascular/Lymphatic: Aortic atherosclerosis. IVC filter is noted in infrarenal position. Reproductive: Status post hysterectomy. Other: Mild ascites is noted in the pelvis and both upper quadrants. Mild anasarca is noted. 11.3 x 5.2 cm mass is noted in posterior portion of right pelvis which is enlarged compared to prior exam and concerning for peritoneal implant. Musculoskeletal: No acute or significant osseous findings. IMPRESSION: Large bilateral pleural effusions are noted with adjacent atelectasis of the bilateral upper and lower lobes. Significantly worsened diffuse hepatic metastatic disease is noted compared to prior exam. Mild ascites is noted. 11.3 x 5.2 cm mass is noted in the right and posterior portion of the pelvis which is enlarged compared to prior exam and concerning for peritoneal implant or other metastatic disease. Aortic Atherosclerosis (ICD10-I70.0). Electronically Signed   By: Marijo Conception M.D.   On: 12/09/2020 12:55   DG Chest Port 1 View  Result Date: 12/09/2020 CLINICAL DATA:  Shortness of breath, weakness, abdominal pain, history metastatic colon cancer to liver, lower extremity edema, diabetes mellitus, hypertension EXAM: PORTABLE CHEST 1 VIEW COMPARISON:  Portable exam 0915 hours compared to 11/24/2020 FINDINGS: RIGHT jugular Port-A-Cath with tip projecting over cavoatrial junction.  Normal heart size, mediastinal contours, and pulmonary vascularity. Atherosclerotic calcification aorta. Enlargement of AP window suggesting adenopathy. BILATERAL pleural effusions and basilar atelectasis. No infiltrate or pneumothorax. Bones demineralized. IMPRESSION: BILATERAL pleural effusions and basilar atelectasis. Enlargement of AP window question adenopathy; consider CT assessment with contrast if renal function permits. Electronically Signed   By: Lavonia Dana M.D.   On: 12/09/2020 09:50    Procedures Procedures   Medications Ordered in ED Medications  fentaNYL (SUBLIMAZE) injection 25 mcg (25 mcg Intravenous Given 12/09/20 1101)  fentaNYL (SUBLIMAZE) injection 50 mcg (50 mcg Intravenous Given 12/09/20 1224)    ED Course  I have reviewed the triage vital signs and the nursing notes.  Pertinent labs & imaging results that were available during my care of the patient were reviewed by me and considered in my medical decision making (see chart for details).    MDM Rules/Calculators/A&P                           Patient is mildly tachycardic here blood pressure remained stable O2 saturation 97% or higher on room air.  Patient is not in abdominal pain at this time.  Abdominal exam shows no guarding or rebound.  Work-up shows elevated liver enzymes and chemistry eval.  White count elevated 14 hemoglobin 10.  CT imaging shows progression of cancer and progression of pulmonary effusions although she does not require oxygen supplementation at this time.  I discussed goals of care with family as they are currently in palliative care.  Hospice care came and spoke with the family but the family member states that they did not fully understand what hospice was about.  We spent a lengthy amount of time approximately 1015 minutes discussing what hospice care means and what that would mean for their loved one.  Family states that they would like to pursue hospice care and will recontact them  again.  Patient otherwise is stable vitals pain appears controlled.  Recommending increasing the tramadol which she takes at home.  Family states that the tramadol does resolve the patient's pain but it does not seem to last long enough.  I advised him to increase the frequency to every 4-5 hours instead of every 6.   Final Clinical Impression(s) / ED Diagnoses Final diagnoses:  Metastatic malignant neoplasm, unspecified site [facility])  Generalized abdominal pain    Rx / DC Orders ED Discharge Orders     None        Luna Fuse, MD 12/09/20 1321

## 2020-12-09 NOTE — ED Triage Notes (Signed)
Pt via EMS. Complaints of abdominal pain that started this morning. Hx of cancer with METs to the liver, not currently undergoing treatment. Pitting edema bilateral extremities. 98% RA.

## 2020-12-09 NOTE — ED Notes (Signed)
ED Provider at bedside. 

## 2020-12-09 NOTE — Telephone Encounter (Signed)
(  4:20 pm) SW returned call to patient's daughter-[name]. [name] stated that she is tired, her mother is tired and she think it's time for hospice. SW advised her that she will request follow-up on obtaining a hospice order for patient. [name] thanked SW for calling back.

## 2020-12-10 DIAGNOSIS — C189 Malignant neoplasm of colon, unspecified: Secondary | ICD-10-CM | POA: Diagnosis not present

## 2020-12-10 DIAGNOSIS — I081 Rheumatic disorders of both mitral and tricuspid valves: Secondary | ICD-10-CM | POA: Diagnosis not present

## 2020-12-10 DIAGNOSIS — L89152 Pressure ulcer of sacral region, stage 2: Secondary | ICD-10-CM | POA: Diagnosis not present

## 2020-12-10 DIAGNOSIS — I5033 Acute on chronic diastolic (congestive) heart failure: Secondary | ICD-10-CM | POA: Diagnosis not present

## 2020-12-10 DIAGNOSIS — I69359 Hemiplegia and hemiparesis following cerebral infarction affecting unspecified side: Secondary | ICD-10-CM | POA: Diagnosis not present

## 2020-12-10 DIAGNOSIS — I110 Hypertensive heart disease with heart failure: Secondary | ICD-10-CM | POA: Diagnosis not present

## 2020-12-10 DIAGNOSIS — E119 Type 2 diabetes mellitus without complications: Secondary | ICD-10-CM | POA: Diagnosis not present

## 2020-12-10 DIAGNOSIS — C787 Secondary malignant neoplasm of liver and intrahepatic bile duct: Secondary | ICD-10-CM | POA: Diagnosis not present

## 2020-12-10 DIAGNOSIS — D630 Anemia in neoplastic disease: Secondary | ICD-10-CM | POA: Diagnosis not present

## 2020-12-13 ENCOUNTER — Telehealth: Payer: Self-pay

## 2020-12-13 NOTE — Telephone Encounter (Signed)
Received message to call patient's daughter, [name]. Phone call place to Garvin who provided update that patient has been sleeping today and she has not been able to get patient to take medications. Patient is supposed to have metoprolol, protonix, prilosec and potassium. Heart rate is 120. Patient is scheduled for Hospice AV.    Provided education regarding Hospice care and focus of care is comfort. Discussed signs patient may show if she is in pain/distress. Encouraged daughter to allow patient to rest and if medications are scheduled for one time a day that she can give medication if patient wakes up enough to safely take medications. Daughter shared that patient currently looks comfortable. Provided daughter with after hours number if she notes changes in her mom and needs to speak with a nurse. Daughter expressed understanding and appreciation.   Dr. Mariea Clonts updated for further direction as needed. Nothing further needed at this time.

## 2020-12-23 DEATH — deceased

## 2020-12-31 DIAGNOSIS — J90 Pleural effusion, not elsewhere classified: Secondary | ICD-10-CM | POA: Diagnosis not present

## 2020-12-31 DIAGNOSIS — I5033 Acute on chronic diastolic (congestive) heart failure: Secondary | ICD-10-CM | POA: Diagnosis not present

## 2020-12-31 DIAGNOSIS — R601 Generalized edema: Secondary | ICD-10-CM | POA: Diagnosis not present

## 2021-01-01 DIAGNOSIS — I69354 Hemiplegia and hemiparesis following cerebral infarction affecting left non-dominant side: Secondary | ICD-10-CM | POA: Diagnosis not present

## 2021-03-30 IMAGING — CT CT ABDOMEN AND PELVIS WITHOUT CONTRAST
2 of 4 series · 16 of 46 positions shown, 18 images · non-contrast
Comparison: CT abdomen pelvis, 11/15/2006

CLINICAL DATA: Anemia, unexplained, post transfusion

EXAM:
CT ABDOMEN AND PELVIS WITHOUT CONTRAST
TECHNIQUE: Multidetector CT imaging of the abdomen and pelvis was performed
following the standard protocol without IV contrast.

[Series 3: abd/ pelvis 5.0 i30f 2 · axial · 0.71mm/px · z∈[+889,+1309]mm · 13 of 92 slices shown, 15 images]
[im 4/92  soft-tissue]
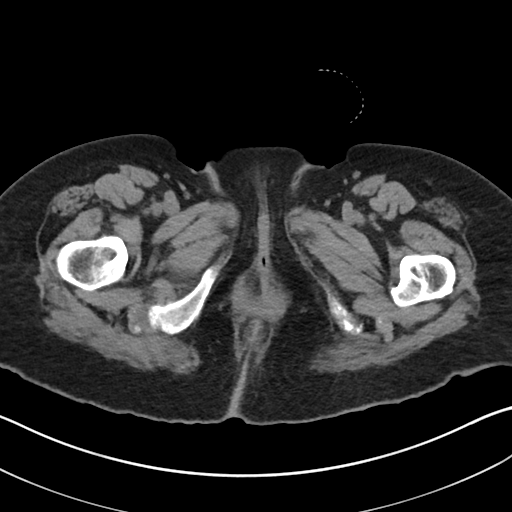
[im 4/92  bone]
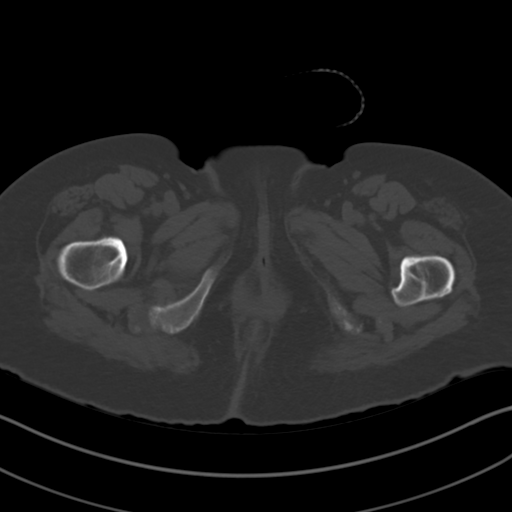
[im 11/92  soft-tissue]
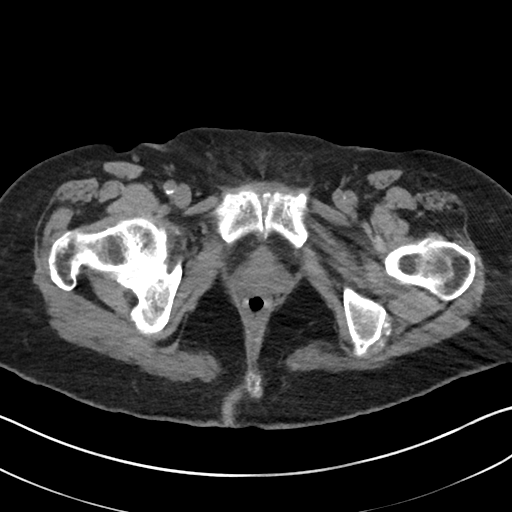
[im 19/92  soft-tissue]
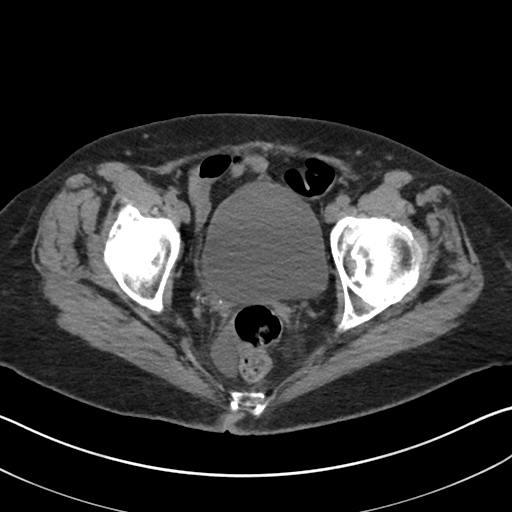
[im 26/92  soft-tissue]
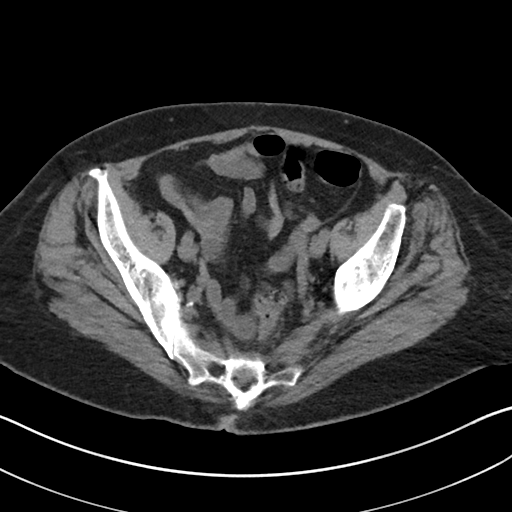
[im 33/92  soft-tissue]
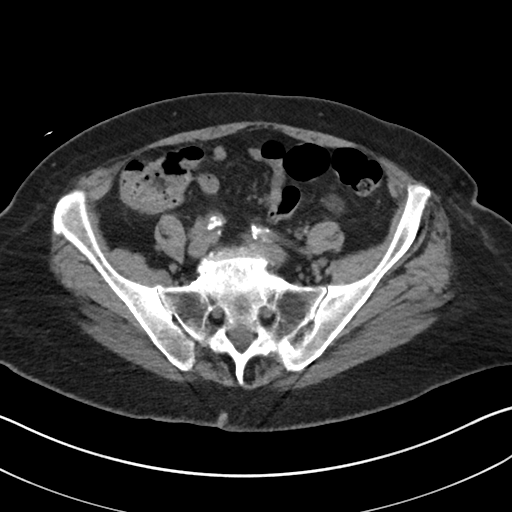
[im 41/92  soft-tissue]
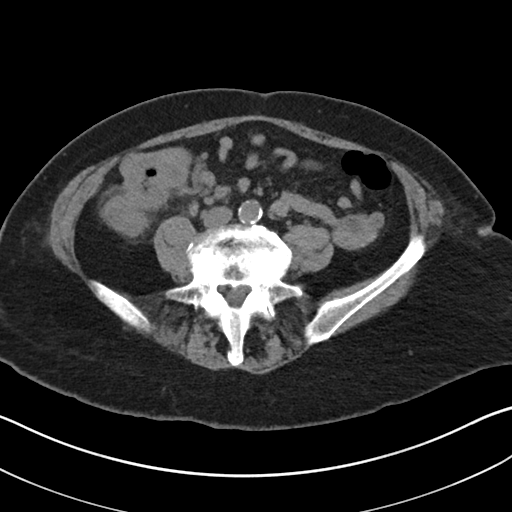
[im 48/92  soft-tissue]
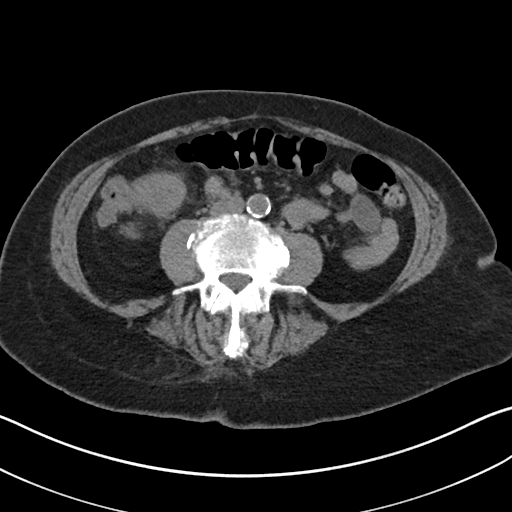
[im 51/92  soft-tissue]
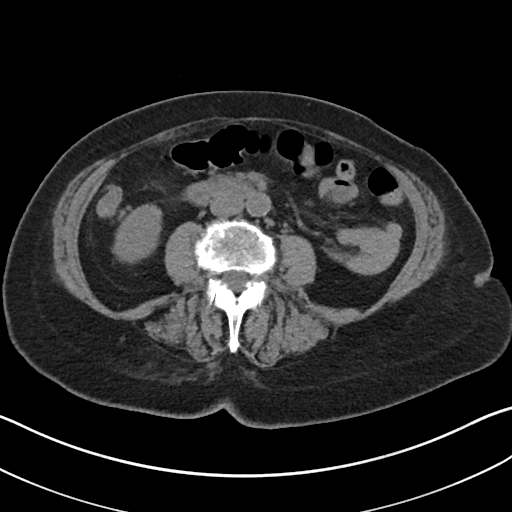
[im 59/92  soft-tissue]
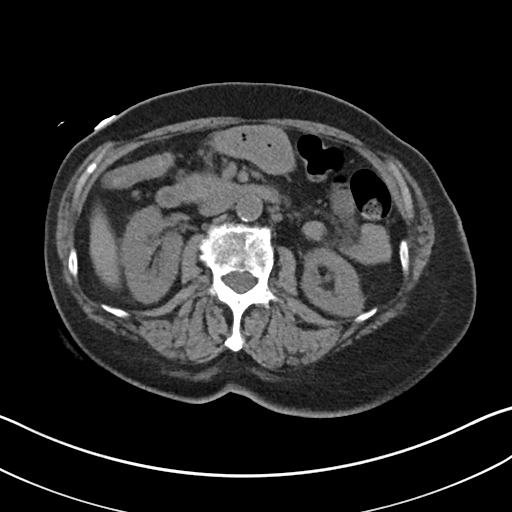
[im 59/92  bone]
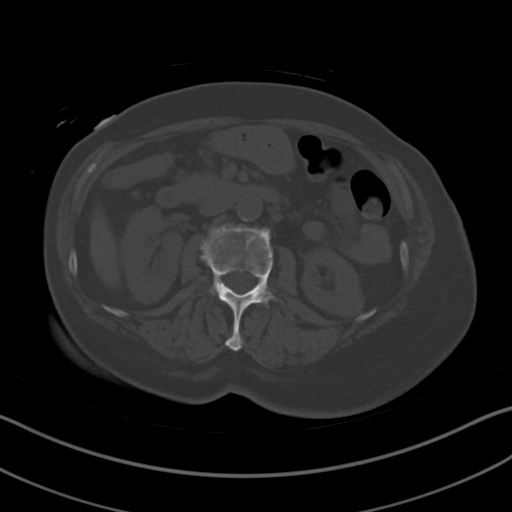
[im 66/92  soft-tissue]
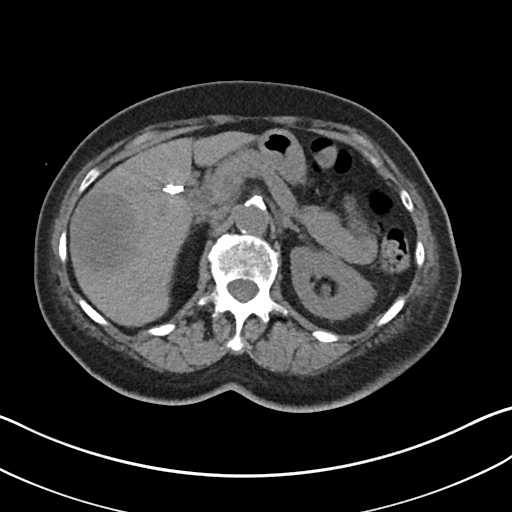
[im 73/92  soft-tissue]
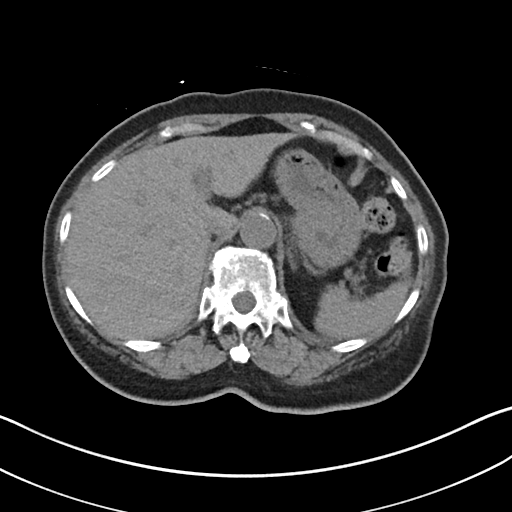
[im 81/92  soft-tissue]
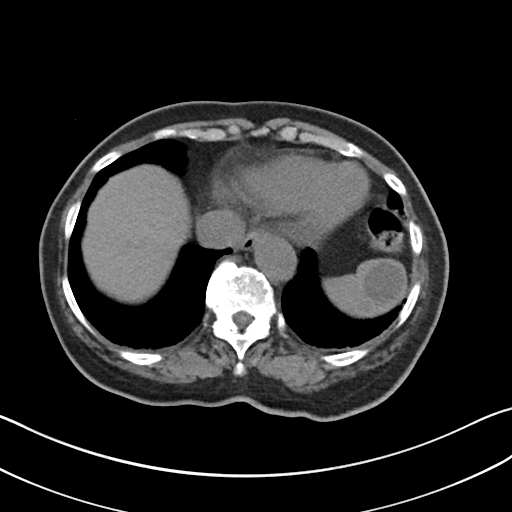
[im 88/92  soft-tissue]
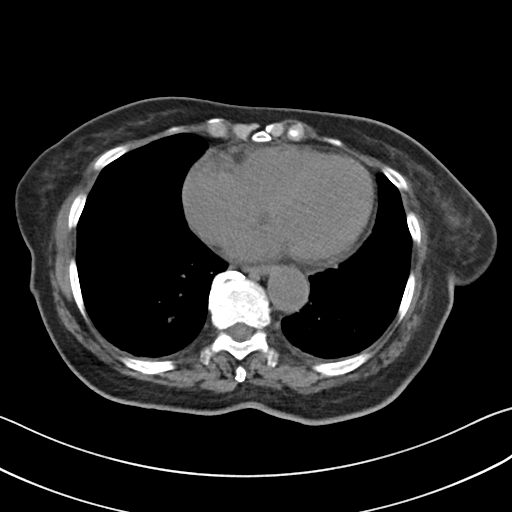

[Series 6: cor st · coronal · 0.76mm/px · 3 of 97 slices shown]
[im 33/97  soft-tissue]
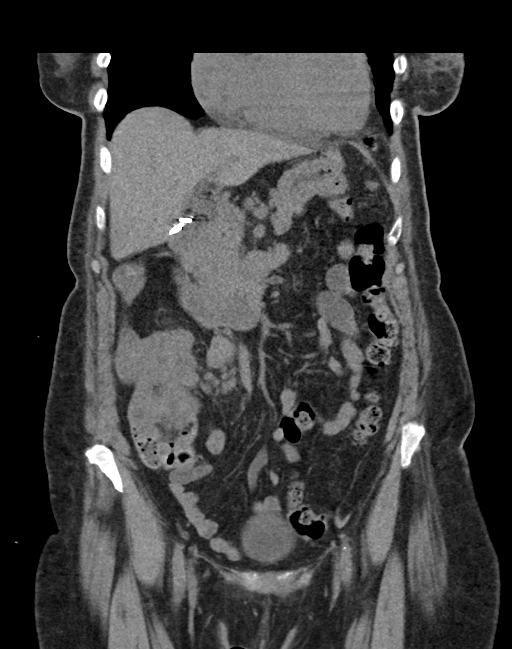
[im 43/97  soft-tissue]
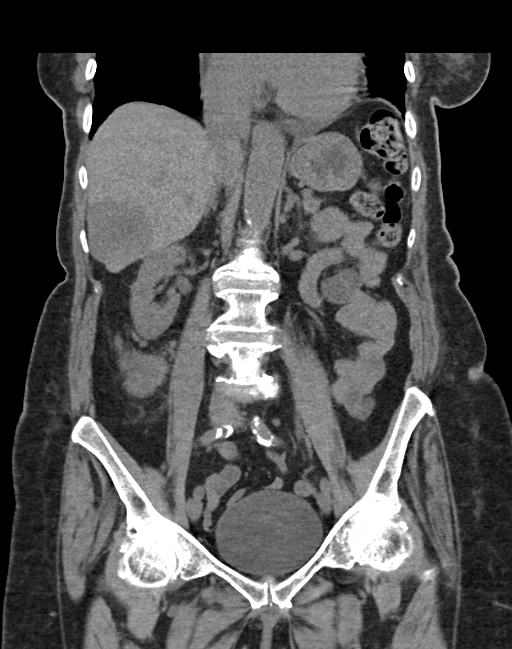
[im 54/97  soft-tissue]
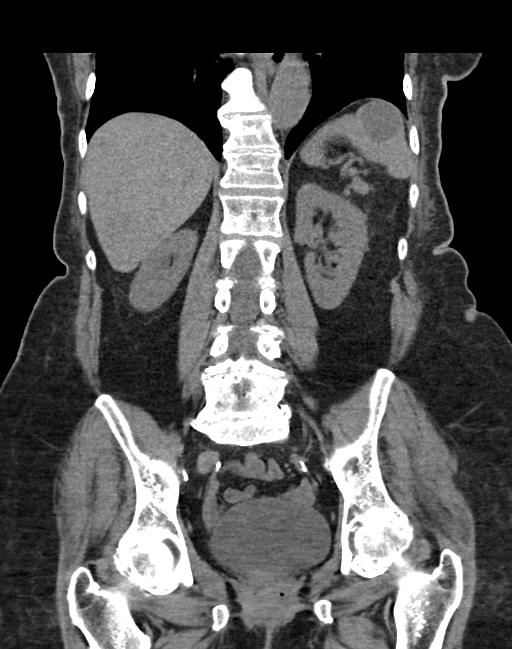

[16 of 46 positions shown; findings below may reference images not displayed]

FINDINGS: Lower chest: Trace bilateral pleural effusions and associated
atelectasis or consolidation

Hepatobiliary: There is a large, hypodense mass of the right lobe of
the liver measuring at least 5.9 x 5.0 cm (series 3, image 27).
Status post cholecystic

Pancreas: Unremarkable. No pancreatic ductal dilatation or
surrounding inflammatory changes.

Spleen: Normal in size. Fluid attenuation cystic lesion of the
spleen not significantly changed compared to prior examination dated
4111.

Adrenals/Urinary Tract: Adrenal glands are unremarkable. Kidneys are
normal, without renal calculi, solid lesion, or hydronephrosis.
Bladder is unremarkable.

Stomach/Bowel: Stomach is within normal limits. Appendix appears
normal. There is a large, circumferential mass of the proximal cecum
just above ileocecal valve (series 3, image 55, series 6, image 39).

Vascular/Lymphatic: Aortic atherosclerosis. There are enlarged lymph
nodes of the right mesocolon measuring up to 2.1 cm (series 3, image
47).

Reproductive: Status post hysterectomy.

Other: No abdominal wall hernia or abnormality. No abdominopelvic
ascites.

Musculoskeletal: No acute or significant osseous findings.
IMPRESSION: 1. There is a large, circumferential mass of the proximal cecum just
above ileocecal valve (series 3, image 55, series 6, image 39).
There are enlarged lymph nodes of the right mesocolon measuring up
to 2.1 cm (series 3, image 47). Findings are consistent with primary
colon malignancy.

2. There is a large, hypodense mass of the right lobe of the liver
measuring at least 5.9 x 5.0 cm (series 3, image 27). This is new in
comparison to remote prior CT dated 1996 and concerning for
metastatic lesion.

3. Other chronic, incidental, and postoperative findings as detailed
above.
